# Patient Record
Sex: Male | Born: 1935 | Race: White | Hispanic: No | Marital: Married | Smoking: Former smoker
Health system: Southern US, Community
[De-identification: ages and names within clinical notes are randomized; demographics above are authoritative.]

## PROBLEM LIST (undated history)

## (undated) DIAGNOSIS — J189 Pneumonia, unspecified organism: Secondary | ICD-10-CM

## (undated) DIAGNOSIS — C499 Malignant neoplasm of connective and soft tissue, unspecified: Secondary | ICD-10-CM

## (undated) DIAGNOSIS — J90 Pleural effusion, not elsewhere classified: Secondary | ICD-10-CM

## (undated) DIAGNOSIS — I4819 Other persistent atrial fibrillation: Secondary | ICD-10-CM

## (undated) DIAGNOSIS — K769 Liver disease, unspecified: Secondary | ICD-10-CM

## (undated) DIAGNOSIS — N400 Enlarged prostate without lower urinary tract symptoms: Secondary | ICD-10-CM

## (undated) DIAGNOSIS — K219 Gastro-esophageal reflux disease without esophagitis: Secondary | ICD-10-CM

## (undated) DIAGNOSIS — I5020 Unspecified systolic (congestive) heart failure: Secondary | ICD-10-CM

## (undated) DIAGNOSIS — R569 Unspecified convulsions: Secondary | ICD-10-CM

## (undated) DIAGNOSIS — I472 Ventricular tachycardia, unspecified: Secondary | ICD-10-CM

## (undated) DIAGNOSIS — Z8719 Personal history of other diseases of the digestive system: Secondary | ICD-10-CM

## (undated) DIAGNOSIS — C248 Malignant neoplasm of overlapping sites of biliary tract: Secondary | ICD-10-CM

## (undated) DIAGNOSIS — K8689 Other specified diseases of pancreas: Secondary | ICD-10-CM

## (undated) DIAGNOSIS — H269 Unspecified cataract: Secondary | ICD-10-CM

## (undated) DIAGNOSIS — I2510 Atherosclerotic heart disease of native coronary artery without angina pectoris: Secondary | ICD-10-CM

## (undated) DIAGNOSIS — I5023 Acute on chronic systolic (congestive) heart failure: Secondary | ICD-10-CM

## (undated) DIAGNOSIS — I10 Essential (primary) hypertension: Secondary | ICD-10-CM

## (undated) DIAGNOSIS — E039 Hypothyroidism, unspecified: Secondary | ICD-10-CM

## (undated) DIAGNOSIS — N182 Chronic kidney disease, stage 2 (mild): Secondary | ICD-10-CM

## (undated) HISTORY — DX: Unspecified systolic (congestive) heart failure: I50.20

## (undated) HISTORY — DX: Essential (primary) hypertension: I10

## (undated) HISTORY — PX: CHOLECYSTECTOMY: SHX55

## (undated) HISTORY — DX: Malignant neoplasm of connective and soft tissue, unspecified: C49.9

## (undated) HISTORY — PX: APPENDECTOMY: SHX54

## (undated) HISTORY — PX: FRACTURE SURGERY: SHX138

## (undated) HISTORY — DX: Gastro-esophageal reflux disease without esophagitis: K21.9

## (undated) HISTORY — DX: Other specified diseases of pancreas: K86.89

## (undated) HISTORY — PX: PARTIAL GASTRECTOMY: SHX2172

## (undated) HISTORY — PX: THORACENTESIS: SHX235

## (undated) HISTORY — DX: Malignant neoplasm of overlapping sites of biliary tract: C24.8

## (undated) HISTORY — PX: WHIPPLE PROCEDURE: SHX2667

## (undated) HISTORY — DX: Unspecified cataract: H26.9

## (undated) HISTORY — DX: Atherosclerotic heart disease of native coronary artery without angina pectoris: I25.10

## (undated) HISTORY — DX: Benign prostatic hyperplasia without lower urinary tract symptoms: N40.0

## (undated) HISTORY — DX: Ventricular tachycardia: I47.2

## (undated) HISTORY — DX: Personal history of other diseases of the digestive system: Z87.19

## (undated) HISTORY — DX: Liver disease, unspecified: K76.9

## (undated) HISTORY — DX: Ventricular tachycardia, unspecified: I47.20

## (undated) HISTORY — PX: INGUINAL HERNIA REPAIR: SUR1180

## (undated) HISTORY — PX: TUMOR EXCISION: SHX421

## (undated) HISTORY — DX: Hypothyroidism, unspecified: E03.9

## (undated) HISTORY — PX: CATARACT EXTRACTION W/ INTRAOCULAR LENS  IMPLANT, BILATERAL: SHX1307

---
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
## 1989-11-28 DIAGNOSIS — C499 Malignant neoplasm of connective and soft tissue, unspecified: Secondary | ICD-10-CM

## 1989-11-28 HISTORY — DX: Malignant neoplasm of connective and soft tissue, unspecified: C49.9

## 1996-11-28 DIAGNOSIS — C248 Malignant neoplasm of overlapping sites of biliary tract: Secondary | ICD-10-CM

## 1996-11-28 HISTORY — DX: Malignant neoplasm of overlapping sites of biliary tract: C24.8

## 2006-09-21 ENCOUNTER — Ambulatory Visit: Payer: Self-pay | Admitting: Internal Medicine

## 2006-10-04 ENCOUNTER — Ambulatory Visit: Payer: Self-pay | Admitting: Gastroenterology

## 2006-10-10 ENCOUNTER — Encounter (INDEPENDENT_AMBULATORY_CARE_PROVIDER_SITE_OTHER): Payer: Self-pay | Admitting: Specialist

## 2006-10-10 ENCOUNTER — Ambulatory Visit: Payer: Self-pay | Admitting: Gastroenterology

## 2006-10-10 LAB — HM COLONOSCOPY: HM Colonoscopy: ABNORMAL

## 2006-12-05 ENCOUNTER — Ambulatory Visit: Payer: Self-pay | Admitting: Internal Medicine

## 2006-12-05 LAB — CONVERTED CEMR LAB
BUN: 19 mg/dL (ref 6–23)
CO2: 30 meq/L (ref 19–32)
Calcium: 9.2 mg/dL (ref 8.4–10.5)
Chloride: 105 meq/L (ref 96–112)
Creatinine, Ser: 0.9 mg/dL (ref 0.4–1.5)
Hgb A1c MFr Bld: 6.6 % — ABNORMAL HIGH (ref 4.6–6.0)
Triglyceride fasting, serum: 57 mg/dL (ref 0–149)
VLDL: 11 mg/dL (ref 0–40)

## 2007-04-23 ENCOUNTER — Emergency Department (HOSPITAL_COMMUNITY): Admission: EM | Admit: 2007-04-23 | Discharge: 2007-04-23 | Payer: Self-pay | Admitting: Emergency Medicine

## 2007-04-24 ENCOUNTER — Ambulatory Visit: Payer: Self-pay | Admitting: Internal Medicine

## 2007-04-27 ENCOUNTER — Ambulatory Visit: Payer: Self-pay | Admitting: Internal Medicine

## 2007-04-30 ENCOUNTER — Ambulatory Visit: Payer: Self-pay | Admitting: Cardiovascular Disease

## 2007-05-04 ENCOUNTER — Ambulatory Visit: Payer: Self-pay | Admitting: Cardiology

## 2007-05-09 ENCOUNTER — Ambulatory Visit: Payer: Self-pay | Admitting: Cardiology

## 2007-05-16 ENCOUNTER — Ambulatory Visit: Payer: Self-pay | Admitting: Cardiology

## 2007-05-22 ENCOUNTER — Ambulatory Visit: Payer: Self-pay | Admitting: Cardiology

## 2007-05-22 ENCOUNTER — Ambulatory Visit: Payer: Self-pay | Admitting: Internal Medicine

## 2007-05-22 ENCOUNTER — Encounter: Payer: Self-pay | Admitting: Internal Medicine

## 2007-05-22 ENCOUNTER — Ambulatory Visit: Payer: Self-pay

## 2007-05-22 LAB — CONVERTED CEMR LAB
Basophils Absolute: 0.1 10*3/uL (ref 0.0–0.1)
Basophils Relative: 0.8 % (ref 0.0–1.0)
CO2: 30 meq/L (ref 19–32)
Chloride: 102 meq/L (ref 96–112)
Creatinine, Ser: 1 mg/dL (ref 0.4–1.5)
HCT: 42.5 % (ref 39.0–52.0)
Hemoglobin: 14.2 g/dL (ref 13.0–17.0)
INR: 2.3 — ABNORMAL HIGH (ref 0.9–2.0)
Magnesium: 2.2 mg/dL (ref 1.5–2.5)
Monocytes Absolute: 0.8 10*3/uL — ABNORMAL HIGH (ref 0.2–0.7)
Neutrophils Relative %: 64.7 % (ref 43.0–77.0)
Potassium: 4.5 meq/L (ref 3.5–5.1)
Prothrombin Time: 18.9 s — ABNORMAL HIGH (ref 10.0–14.0)
RBC: 4.32 M/uL (ref 4.22–5.81)
RDW: 12.8 % (ref 11.5–14.6)
Sodium: 138 meq/L (ref 135–145)
WBC: 7 10*3/uL (ref 4.5–10.5)
aPTT: 34.3 s (ref 26.5–36.5)

## 2007-05-24 ENCOUNTER — Encounter: Payer: Self-pay | Admitting: Internal Medicine

## 2007-05-24 ENCOUNTER — Ambulatory Visit: Payer: Self-pay | Admitting: Internal Medicine

## 2007-05-24 ENCOUNTER — Ambulatory Visit (HOSPITAL_COMMUNITY): Admission: RE | Admit: 2007-05-24 | Discharge: 2007-05-24 | Payer: Self-pay | Admitting: Internal Medicine

## 2007-05-29 ENCOUNTER — Ambulatory Visit: Payer: Self-pay | Admitting: Internal Medicine

## 2007-05-30 ENCOUNTER — Ambulatory Visit: Payer: Self-pay | Admitting: Internal Medicine

## 2007-05-30 LAB — CONVERTED CEMR LAB
BUN: 17 mg/dL
CO2: 29 meq/L
Calcium: 9.1 mg/dL
Chloride: 103 meq/L
Creatinine, Ser: 0.8 mg/dL
GFR calc Af Amer: 120 mL/min
GFR calc non Af Amer: 99 mL/min
Glucose, Bld: 134 mg/dL — ABNORMAL HIGH
INR: 2.3 — ABNORMAL HIGH
Magnesium: 2.3 mg/dL
Potassium: 4.5 meq/L
Prothrombin Time: 18.8 s — ABNORMAL HIGH
Sodium: 139 meq/L

## 2007-05-31 ENCOUNTER — Inpatient Hospital Stay (HOSPITAL_COMMUNITY): Admission: AD | Admit: 2007-05-31 | Discharge: 2007-06-03 | Payer: Self-pay | Admitting: Internal Medicine

## 2007-05-31 ENCOUNTER — Ambulatory Visit: Payer: Self-pay | Admitting: Internal Medicine

## 2007-06-14 ENCOUNTER — Ambulatory Visit: Payer: Self-pay | Admitting: Cardiology

## 2007-06-14 LAB — CONVERTED CEMR LAB
BUN: 19 mg/dL (ref 6–23)
Calcium: 9.2 mg/dL (ref 8.4–10.5)
GFR calc Af Amer: 104 mL/min
Magnesium: 2.3 mg/dL (ref 1.5–2.5)
Potassium: 5 meq/L (ref 3.5–5.1)

## 2007-07-16 ENCOUNTER — Encounter: Payer: Self-pay | Admitting: Internal Medicine

## 2007-07-16 ENCOUNTER — Ambulatory Visit: Payer: Self-pay

## 2007-07-16 ENCOUNTER — Ambulatory Visit: Payer: Self-pay | Admitting: Cardiology

## 2007-07-18 ENCOUNTER — Ambulatory Visit: Payer: Self-pay | Admitting: Internal Medicine

## 2007-07-23 ENCOUNTER — Ambulatory Visit: Payer: Self-pay

## 2007-07-23 LAB — CONVERTED CEMR LAB
Eosinophils Absolute: 0.2 10*3/uL (ref 0.0–0.6)
Eosinophils Relative: 3 % (ref 0.0–5.0)
HCT: 35.2 % — ABNORMAL LOW (ref 39.0–52.0)
Hemoglobin: 12.5 g/dL — ABNORMAL LOW (ref 13.0–17.0)
INR: 1.7 — ABNORMAL HIGH (ref 0.8–1.0)
Lymphocytes Relative: 15 % (ref 12.0–46.0)
MCV: 94 fL (ref 78.0–100.0)
Neutro Abs: 3.9 10*3/uL (ref 1.4–7.7)
Neutrophils Relative %: 69 % (ref 43.0–77.0)
Prothrombin Time: 16.1 s — ABNORMAL HIGH (ref 10.9–13.3)
WBC: 5.7 10*3/uL (ref 4.5–10.5)

## 2007-07-24 ENCOUNTER — Ambulatory Visit: Payer: Self-pay | Admitting: Internal Medicine

## 2007-08-13 ENCOUNTER — Ambulatory Visit: Payer: Self-pay | Admitting: Internal Medicine

## 2007-08-29 ENCOUNTER — Ambulatory Visit: Payer: Self-pay | Admitting: Internal Medicine

## 2007-09-10 ENCOUNTER — Ambulatory Visit: Payer: Self-pay | Admitting: Internal Medicine

## 2007-09-19 ENCOUNTER — Encounter: Payer: Self-pay | Admitting: *Deleted

## 2007-09-19 DIAGNOSIS — E118 Type 2 diabetes mellitus with unspecified complications: Secondary | ICD-10-CM | POA: Insufficient documentation

## 2007-09-19 DIAGNOSIS — Z87448 Personal history of other diseases of urinary system: Secondary | ICD-10-CM

## 2007-09-19 DIAGNOSIS — C248 Malignant neoplasm of overlapping sites of biliary tract: Secondary | ICD-10-CM | POA: Insufficient documentation

## 2007-09-19 DIAGNOSIS — Z9889 Other specified postprocedural states: Secondary | ICD-10-CM

## 2007-09-19 DIAGNOSIS — I4819 Other persistent atrial fibrillation: Secondary | ICD-10-CM

## 2007-09-19 DIAGNOSIS — Z87898 Personal history of other specified conditions: Secondary | ICD-10-CM

## 2007-09-19 DIAGNOSIS — K219 Gastro-esophageal reflux disease without esophagitis: Secondary | ICD-10-CM

## 2007-09-19 DIAGNOSIS — K922 Gastrointestinal hemorrhage, unspecified: Secondary | ICD-10-CM | POA: Insufficient documentation

## 2007-09-19 DIAGNOSIS — Z9089 Acquired absence of other organs: Secondary | ICD-10-CM

## 2007-10-08 ENCOUNTER — Ambulatory Visit: Payer: Self-pay | Admitting: Internal Medicine

## 2007-10-24 ENCOUNTER — Ambulatory Visit: Payer: Self-pay | Admitting: Internal Medicine

## 2007-10-26 ENCOUNTER — Encounter: Payer: Self-pay | Admitting: Internal Medicine

## 2007-10-31 ENCOUNTER — Ambulatory Visit: Payer: Self-pay | Admitting: Cardiology

## 2007-10-31 ENCOUNTER — Ambulatory Visit: Payer: Self-pay | Admitting: Internal Medicine

## 2007-11-16 ENCOUNTER — Ambulatory Visit: Payer: Self-pay | Admitting: Cardiology

## 2007-12-14 ENCOUNTER — Ambulatory Visit: Payer: Self-pay | Admitting: Cardiology

## 2008-01-11 ENCOUNTER — Telehealth: Payer: Self-pay | Admitting: Internal Medicine

## 2008-01-11 ENCOUNTER — Ambulatory Visit: Payer: Self-pay | Admitting: Internal Medicine

## 2008-01-20 ENCOUNTER — Telehealth: Payer: Self-pay | Admitting: Internal Medicine

## 2008-01-20 ENCOUNTER — Inpatient Hospital Stay (HOSPITAL_COMMUNITY): Admission: EM | Admit: 2008-01-20 | Discharge: 2008-01-30 | Payer: Self-pay | Admitting: Emergency Medicine

## 2008-01-21 ENCOUNTER — Ambulatory Visit: Payer: Self-pay | Admitting: Internal Medicine

## 2008-01-24 ENCOUNTER — Ambulatory Visit: Payer: Self-pay | Admitting: Gastroenterology

## 2008-01-25 ENCOUNTER — Encounter: Payer: Self-pay | Admitting: Internal Medicine

## 2008-01-26 ENCOUNTER — Encounter: Payer: Self-pay | Admitting: Internal Medicine

## 2008-01-27 DIAGNOSIS — K769 Liver disease, unspecified: Secondary | ICD-10-CM

## 2008-01-27 HISTORY — DX: Liver disease, unspecified: K76.9

## 2008-01-29 ENCOUNTER — Encounter (INDEPENDENT_AMBULATORY_CARE_PROVIDER_SITE_OTHER): Payer: Self-pay | Admitting: Interventional Radiology

## 2008-02-01 ENCOUNTER — Emergency Department (HOSPITAL_COMMUNITY): Admission: EM | Admit: 2008-02-01 | Discharge: 2008-02-01 | Payer: Self-pay | Admitting: Emergency Medicine

## 2008-02-04 ENCOUNTER — Telehealth: Payer: Self-pay | Admitting: Internal Medicine

## 2008-02-04 ENCOUNTER — Ambulatory Visit: Payer: Self-pay | Admitting: Internal Medicine

## 2008-02-04 DIAGNOSIS — K831 Obstruction of bile duct: Secondary | ICD-10-CM | POA: Insufficient documentation

## 2008-02-04 LAB — CONVERTED CEMR LAB
Basophils Absolute: 0.1 10*3/uL (ref 0.0–0.1)
Basophils Relative: 0.6 % (ref 0.0–1.0)
Hemoglobin: 12.5 g/dL — ABNORMAL LOW (ref 13.0–17.0)
Lymphocytes Relative: 8.3 % — ABNORMAL LOW (ref 12.0–46.0)
MCHC: 32.9 g/dL (ref 30.0–36.0)
Monocytes Absolute: 1 10*3/uL — ABNORMAL HIGH (ref 0.2–0.7)
Monocytes Relative: 11.8 % — ABNORMAL HIGH (ref 3.0–11.0)
Neutro Abs: 6.7 10*3/uL (ref 1.4–7.7)
Neutrophils Relative %: 78.9 % — ABNORMAL HIGH (ref 43.0–77.0)
aPTT: 29.5 s (ref 21.7–29.8)

## 2008-02-05 ENCOUNTER — Encounter: Payer: Self-pay | Admitting: Internal Medicine

## 2008-02-06 ENCOUNTER — Encounter: Payer: Self-pay | Admitting: Internal Medicine

## 2008-02-07 ENCOUNTER — Ambulatory Visit (HOSPITAL_COMMUNITY): Admission: RE | Admit: 2008-02-07 | Discharge: 2008-02-07 | Payer: Self-pay | Admitting: Internal Medicine

## 2008-02-08 ENCOUNTER — Ambulatory Visit: Payer: Self-pay | Admitting: Internal Medicine

## 2008-02-12 ENCOUNTER — Encounter (INDEPENDENT_AMBULATORY_CARE_PROVIDER_SITE_OTHER): Payer: Self-pay | Admitting: *Deleted

## 2008-02-15 ENCOUNTER — Ambulatory Visit: Payer: Self-pay | Admitting: Cardiovascular Disease

## 2008-02-18 ENCOUNTER — Ambulatory Visit: Payer: Self-pay | Admitting: Internal Medicine

## 2008-02-20 ENCOUNTER — Telehealth: Payer: Self-pay | Admitting: Internal Medicine

## 2008-02-21 ENCOUNTER — Encounter: Admission: RE | Admit: 2008-02-21 | Discharge: 2008-02-21 | Payer: Self-pay | Admitting: Interventional Radiology

## 2008-02-25 ENCOUNTER — Encounter: Payer: Self-pay | Admitting: Internal Medicine

## 2008-02-26 ENCOUNTER — Ambulatory Visit: Payer: Self-pay | Admitting: Internal Medicine

## 2008-03-03 ENCOUNTER — Ambulatory Visit: Payer: Self-pay | Admitting: Gastroenterology

## 2008-03-11 ENCOUNTER — Ambulatory Visit: Payer: Self-pay | Admitting: Cardiology

## 2008-03-21 ENCOUNTER — Encounter: Payer: Self-pay | Admitting: Internal Medicine

## 2008-04-01 ENCOUNTER — Ambulatory Visit: Payer: Self-pay | Admitting: Cardiology

## 2008-04-11 ENCOUNTER — Ambulatory Visit: Payer: Self-pay | Admitting: Cardiology

## 2008-04-17 ENCOUNTER — Ambulatory Visit: Payer: Self-pay | Admitting: Internal Medicine

## 2008-04-17 DIAGNOSIS — K4090 Unilateral inguinal hernia, without obstruction or gangrene, not specified as recurrent: Secondary | ICD-10-CM | POA: Insufficient documentation

## 2008-04-22 ENCOUNTER — Telehealth: Payer: Self-pay | Admitting: Internal Medicine

## 2008-04-25 ENCOUNTER — Ambulatory Visit: Payer: Self-pay | Admitting: Cardiology

## 2008-05-23 ENCOUNTER — Ambulatory Visit: Payer: Self-pay | Admitting: Cardiology

## 2008-06-09 ENCOUNTER — Ambulatory Visit: Payer: Self-pay | Admitting: Cardiology

## 2008-06-13 ENCOUNTER — Telehealth: Payer: Self-pay | Admitting: Internal Medicine

## 2008-06-30 ENCOUNTER — Ambulatory Visit: Payer: Self-pay | Admitting: Cardiology

## 2008-07-18 ENCOUNTER — Ambulatory Visit: Payer: Self-pay | Admitting: Internal Medicine

## 2008-07-21 ENCOUNTER — Encounter (INDEPENDENT_AMBULATORY_CARE_PROVIDER_SITE_OTHER): Payer: Self-pay | Admitting: *Deleted

## 2008-07-29 ENCOUNTER — Ambulatory Visit: Payer: Self-pay | Admitting: Cardiology

## 2008-08-06 ENCOUNTER — Ambulatory Visit: Payer: Self-pay | Admitting: Internal Medicine

## 2008-08-06 LAB — CONVERTED CEMR LAB
Basophils Absolute: 0 10*3/uL (ref 0.0–0.1)
Calcium: 9 mg/dL (ref 8.4–10.5)
GFR calc Af Amer: 68 mL/min
GFR calc non Af Amer: 56 mL/min
Glucose, Bld: 156 mg/dL — ABNORMAL HIGH (ref 70–99)
HCT: 36.5 % — ABNORMAL LOW (ref 39.0–52.0)
MCHC: 34.9 g/dL (ref 30.0–36.0)
Magnesium: 2.2 mg/dL (ref 1.5–2.5)
Monocytes Absolute: 0.6 10*3/uL (ref 0.1–1.0)
Monocytes Relative: 9.8 % (ref 3.0–12.0)
Platelets: 165 10*3/uL (ref 150–400)
Potassium: 5.5 meq/L — ABNORMAL HIGH (ref 3.5–5.1)
RDW: 13.1 % (ref 11.5–14.6)
Sodium: 140 meq/L (ref 135–145)

## 2008-08-14 ENCOUNTER — Ambulatory Visit: Payer: Self-pay | Admitting: Internal Medicine

## 2008-08-14 LAB — CONVERTED CEMR LAB
Calcium: 9.4 mg/dL (ref 8.4–10.5)
GFR calc Af Amer: 83 mL/min
GFR calc non Af Amer: 68 mL/min
Glucose, Bld: 68 mg/dL — ABNORMAL LOW (ref 70–99)
Potassium: 5.2 meq/L — ABNORMAL HIGH (ref 3.5–5.1)
Sodium: 138 meq/L (ref 135–145)

## 2008-08-18 ENCOUNTER — Encounter: Payer: Self-pay | Admitting: Internal Medicine

## 2008-08-26 ENCOUNTER — Ambulatory Visit (HOSPITAL_COMMUNITY): Admission: RE | Admit: 2008-08-26 | Discharge: 2008-08-27 | Payer: Self-pay | Admitting: General Surgery

## 2008-08-28 ENCOUNTER — Telehealth: Payer: Self-pay | Admitting: Internal Medicine

## 2008-09-04 ENCOUNTER — Ambulatory Visit: Payer: Self-pay | Admitting: Cardiology

## 2008-09-12 ENCOUNTER — Ambulatory Visit: Payer: Self-pay | Admitting: Cardiology

## 2008-09-15 ENCOUNTER — Ambulatory Visit: Payer: Self-pay | Admitting: Internal Medicine

## 2008-09-15 ENCOUNTER — Encounter: Payer: Self-pay | Admitting: Internal Medicine

## 2008-09-15 LAB — CONVERTED CEMR LAB
BUN: 21 mg/dL (ref 6–23)
Chloride: 104 meq/L (ref 96–112)
Creatinine, Ser: 1 mg/dL (ref 0.4–1.5)
GFR calc Af Amer: 92 mL/min
GFR calc non Af Amer: 76 mL/min
Potassium: 5 meq/L (ref 3.5–5.1)

## 2008-09-24 ENCOUNTER — Ambulatory Visit: Payer: Self-pay | Admitting: Cardiology

## 2008-10-17 ENCOUNTER — Ambulatory Visit: Payer: Self-pay | Admitting: Cardiovascular Disease

## 2008-10-21 ENCOUNTER — Ambulatory Visit: Payer: Self-pay | Admitting: Internal Medicine

## 2008-10-21 LAB — CONVERTED CEMR LAB
CO2: 30 meq/L (ref 19–32)
Calcium: 9.2 mg/dL (ref 8.4–10.5)
Creatinine, Ser: 0.9 mg/dL (ref 0.4–1.5)
GFR calc non Af Amer: 86 mL/min
Hgb A1c MFr Bld: 7 % — ABNORMAL HIGH (ref 4.6–6.0)
Sodium: 137 meq/L (ref 135–145)

## 2008-10-27 ENCOUNTER — Encounter: Payer: Self-pay | Admitting: Internal Medicine

## 2008-10-27 ENCOUNTER — Telehealth: Payer: Self-pay | Admitting: Internal Medicine

## 2008-11-03 ENCOUNTER — Encounter: Payer: Self-pay | Admitting: Internal Medicine

## 2008-11-14 ENCOUNTER — Ambulatory Visit: Payer: Self-pay | Admitting: Cardiovascular Disease

## 2008-12-12 ENCOUNTER — Ambulatory Visit: Payer: Self-pay | Admitting: Cardiology

## 2008-12-26 ENCOUNTER — Ambulatory Visit: Payer: Self-pay | Admitting: Cardiovascular Disease

## 2009-01-20 ENCOUNTER — Ambulatory Visit: Payer: Self-pay | Admitting: Internal Medicine

## 2009-01-20 DIAGNOSIS — R141 Gas pain: Secondary | ICD-10-CM

## 2009-01-20 DIAGNOSIS — R143 Flatulence: Secondary | ICD-10-CM

## 2009-01-20 DIAGNOSIS — R142 Eructation: Secondary | ICD-10-CM

## 2009-01-23 ENCOUNTER — Ambulatory Visit: Payer: Self-pay | Admitting: Internal Medicine

## 2009-02-04 ENCOUNTER — Ambulatory Visit: Payer: Self-pay | Admitting: Internal Medicine

## 2009-02-04 LAB — CONVERTED CEMR LAB
Calcium: 9.3 mg/dL (ref 8.4–10.5)
Creatinine, Ser: 1 mg/dL (ref 0.4–1.5)
GFR calc Af Amer: 92 mL/min
GFR calc non Af Amer: 76 mL/min
Magnesium: 2.4 mg/dL (ref 1.5–2.5)
Sodium: 138 meq/L (ref 135–145)

## 2009-02-06 ENCOUNTER — Ambulatory Visit: Payer: Self-pay | Admitting: Cardiovascular Disease

## 2009-03-06 ENCOUNTER — Ambulatory Visit: Payer: Self-pay | Admitting: Cardiology

## 2009-03-20 ENCOUNTER — Ambulatory Visit: Payer: Self-pay | Admitting: Cardiology

## 2009-04-10 ENCOUNTER — Ambulatory Visit: Payer: Self-pay | Admitting: Internal Medicine

## 2009-04-10 ENCOUNTER — Ambulatory Visit: Payer: Self-pay | Admitting: Cardiology

## 2009-04-13 LAB — CONVERTED CEMR LAB
Calcium: 8.7 mg/dL (ref 8.4–10.5)
Creatinine, Ser: 1 mg/dL (ref 0.4–1.5)
GFR calc non Af Amer: 76.06 mL/min (ref >60)
Sodium: 139 meq/L (ref 135–145)

## 2009-04-15 ENCOUNTER — Telehealth: Payer: Self-pay | Admitting: Internal Medicine

## 2009-04-23 ENCOUNTER — Encounter: Payer: Self-pay | Admitting: Internal Medicine

## 2009-04-27 ENCOUNTER — Encounter: Payer: Self-pay | Admitting: Internal Medicine

## 2009-04-28 ENCOUNTER — Encounter: Payer: Self-pay | Admitting: *Deleted

## 2009-05-01 ENCOUNTER — Ambulatory Visit: Payer: Self-pay | Admitting: Cardiology

## 2009-05-01 LAB — CONVERTED CEMR LAB
POC INR: 2
Protime: 17.5

## 2009-05-29 ENCOUNTER — Ambulatory Visit: Payer: Self-pay | Admitting: Cardiovascular Disease

## 2009-05-29 ENCOUNTER — Encounter (INDEPENDENT_AMBULATORY_CARE_PROVIDER_SITE_OTHER): Payer: Self-pay | Admitting: Cardiology

## 2009-05-29 LAB — CONVERTED CEMR LAB: Prothrombin Time: 17.8 s

## 2009-06-03 ENCOUNTER — Encounter: Payer: Self-pay | Admitting: *Deleted

## 2009-06-26 ENCOUNTER — Ambulatory Visit: Payer: Self-pay | Admitting: Cardiology

## 2009-06-26 LAB — CONVERTED CEMR LAB: POC INR: 1.8

## 2009-07-17 ENCOUNTER — Ambulatory Visit: Payer: Self-pay | Admitting: Cardiology

## 2009-08-04 ENCOUNTER — Ambulatory Visit: Payer: Self-pay | Admitting: Internal Medicine

## 2009-08-05 LAB — CONVERTED CEMR LAB
BUN: 18 mg/dL (ref 6–23)
CO2: 29 meq/L (ref 19–32)
Chloride: 105 meq/L (ref 96–112)
Magnesium: 2.2 mg/dL (ref 1.5–2.5)
Potassium: 5.1 meq/L (ref 3.5–5.1)

## 2009-08-14 ENCOUNTER — Ambulatory Visit: Payer: Self-pay | Admitting: Internal Medicine

## 2009-08-18 ENCOUNTER — Telehealth: Payer: Self-pay | Admitting: Internal Medicine

## 2009-08-24 ENCOUNTER — Telehealth: Payer: Self-pay | Admitting: Internal Medicine

## 2009-09-11 ENCOUNTER — Ambulatory Visit: Payer: Self-pay | Admitting: Internal Medicine

## 2009-09-18 ENCOUNTER — Telehealth: Payer: Self-pay | Admitting: Internal Medicine

## 2009-09-21 ENCOUNTER — Telehealth: Payer: Self-pay | Admitting: Internal Medicine

## 2009-10-09 ENCOUNTER — Ambulatory Visit: Payer: Self-pay | Admitting: Cardiology

## 2009-10-09 LAB — CONVERTED CEMR LAB: POC INR: 2.3

## 2009-11-06 ENCOUNTER — Ambulatory Visit: Payer: Self-pay | Admitting: Cardiology

## 2009-11-06 LAB — CONVERTED CEMR LAB: POC INR: 2.1

## 2009-12-04 ENCOUNTER — Ambulatory Visit: Payer: Self-pay | Admitting: Cardiology

## 2009-12-18 ENCOUNTER — Ambulatory Visit: Payer: Self-pay | Admitting: Cardiology

## 2009-12-18 LAB — CONVERTED CEMR LAB: POC INR: 2

## 2009-12-28 ENCOUNTER — Encounter: Payer: Self-pay | Admitting: Internal Medicine

## 2010-01-04 ENCOUNTER — Ambulatory Visit: Payer: Self-pay | Admitting: Internal Medicine

## 2010-01-04 DIAGNOSIS — R519 Headache, unspecified: Secondary | ICD-10-CM | POA: Insufficient documentation

## 2010-01-04 DIAGNOSIS — R51 Headache: Secondary | ICD-10-CM | POA: Insufficient documentation

## 2010-01-04 LAB — CONVERTED CEMR LAB
ALT: 23 units/L (ref 0–53)
AST: 29 units/L (ref 0–37)
Albumin: 4.1 g/dL (ref 3.5–5.2)
Cholesterol: 178 mg/dL (ref 0–200)
Eosinophils Relative: 4.6 % (ref 0.0–5.0)
GFR calc non Af Amer: 75.92 mL/min (ref >60)
Glucose, Bld: 136 mg/dL — ABNORMAL HIGH (ref 70–99)
HDL: 46.4 mg/dL (ref >39.00)
Hgb A1c MFr Bld: 7 % — ABNORMAL HIGH (ref 4.6–6.5)
MCV: 101.6 fL — ABNORMAL HIGH (ref 78.0–100.0)
Monocytes Absolute: 0.8 10*3/uL (ref 0.1–1.0)
Monocytes Relative: 10.8 % (ref 3.0–12.0)
Neutrophils Relative %: 70.2 % (ref 43.0–77.0)
PSA: 2.28 ng/mL (ref 0.10–4.00)
Platelets: 171 10*3/uL (ref 150.0–400.0)
Potassium: 4.7 meq/L (ref 3.5–5.1)
Sodium: 139 meq/L (ref 135–145)
Total CHOL/HDL Ratio: 4
Total Protein: 7.7 g/dL (ref 6.0–8.3)
Triglycerides: 61 mg/dL (ref 0.0–149.0)
WBC: 7 10*3/uL (ref 4.5–10.5)

## 2010-01-05 DIAGNOSIS — J309 Allergic rhinitis, unspecified: Secondary | ICD-10-CM | POA: Insufficient documentation

## 2010-01-05 DIAGNOSIS — M545 Low back pain: Secondary | ICD-10-CM | POA: Insufficient documentation

## 2010-01-08 ENCOUNTER — Encounter (INDEPENDENT_AMBULATORY_CARE_PROVIDER_SITE_OTHER): Payer: Self-pay | Admitting: Cardiology

## 2010-01-08 ENCOUNTER — Ambulatory Visit: Payer: Self-pay | Admitting: Internal Medicine

## 2010-01-27 ENCOUNTER — Encounter: Payer: Self-pay | Admitting: Internal Medicine

## 2010-02-03 ENCOUNTER — Telehealth: Payer: Self-pay | Admitting: Internal Medicine

## 2010-02-05 ENCOUNTER — Ambulatory Visit: Payer: Self-pay | Admitting: Cardiology

## 2010-02-15 ENCOUNTER — Encounter: Payer: Self-pay | Admitting: Internal Medicine

## 2010-03-04 ENCOUNTER — Ambulatory Visit: Payer: Self-pay | Admitting: Cardiovascular Disease

## 2010-03-15 ENCOUNTER — Encounter: Payer: Self-pay | Admitting: Internal Medicine

## 2010-03-22 ENCOUNTER — Ambulatory Visit: Payer: Self-pay | Admitting: Cardiovascular Disease

## 2010-04-19 ENCOUNTER — Ambulatory Visit: Payer: Self-pay | Admitting: Cardiology

## 2010-05-17 ENCOUNTER — Ambulatory Visit: Payer: Self-pay

## 2010-06-02 ENCOUNTER — Telehealth: Payer: Self-pay | Admitting: Internal Medicine

## 2010-06-02 ENCOUNTER — Ambulatory Visit: Payer: Self-pay | Admitting: Internal Medicine

## 2010-06-02 LAB — CONVERTED CEMR LAB: POC INR: 2.2

## 2010-06-23 ENCOUNTER — Ambulatory Visit: Payer: Self-pay | Admitting: Cardiology

## 2010-06-23 LAB — CONVERTED CEMR LAB: POC INR: 3

## 2010-07-05 ENCOUNTER — Encounter: Payer: Self-pay | Admitting: Internal Medicine

## 2010-07-21 ENCOUNTER — Ambulatory Visit: Payer: Self-pay | Admitting: Internal Medicine

## 2010-07-21 LAB — CONVERTED CEMR LAB: POC INR: 2.6

## 2010-08-18 ENCOUNTER — Ambulatory Visit: Payer: Self-pay | Admitting: Cardiology

## 2010-08-18 LAB — CONVERTED CEMR LAB: POC INR: 2.5

## 2010-08-21 ENCOUNTER — Encounter: Admission: RE | Admit: 2010-08-21 | Discharge: 2010-08-21 | Payer: Self-pay | Admitting: Neurology

## 2010-08-26 ENCOUNTER — Ambulatory Visit: Payer: Self-pay | Admitting: Internal Medicine

## 2010-08-30 LAB — CONVERTED CEMR LAB
Calcium: 8.9 mg/dL (ref 8.4–10.5)
Creatinine, Ser: 1 mg/dL (ref 0.4–1.5)
GFR calc non Af Amer: 73.26 mL/min (ref >60)
Sodium: 139 meq/L (ref 135–145)

## 2010-09-15 ENCOUNTER — Ambulatory Visit: Payer: Self-pay | Admitting: Internal Medicine

## 2010-09-15 LAB — CONVERTED CEMR LAB: POC INR: 2.7

## 2010-10-13 ENCOUNTER — Ambulatory Visit: Payer: Self-pay | Admitting: Cardiology

## 2010-10-13 LAB — CONVERTED CEMR LAB: POC INR: 2.2

## 2010-11-10 ENCOUNTER — Ambulatory Visit: Payer: Self-pay | Admitting: Cardiology

## 2010-12-01 ENCOUNTER — Ambulatory Visit: Admission: RE | Admit: 2010-12-01 | Discharge: 2010-12-01 | Payer: Self-pay | Source: Home / Self Care

## 2010-12-01 LAB — CONVERTED CEMR LAB: POC INR: 1.7

## 2010-12-15 ENCOUNTER — Ambulatory Visit: Admission: RE | Admit: 2010-12-15 | Discharge: 2010-12-15 | Payer: Self-pay | Source: Home / Self Care

## 2010-12-15 LAB — CONVERTED CEMR LAB: POC INR: 2.6

## 2010-12-28 NOTE — Medication Information (Signed)
Summary: rov.m  Anticoagulant Therapy  Managed by: Bethena Midget, RN, BSN Referring MD: Sherryl Manges MD PCP: Link Snuffer MD: Antoine Poche MD, Fayrene Fearing Indication 1: Atrial Fibrillation (ICD-427.31) Lab Used: [facility] Site: [facility] INR POC 3.0 INR RANGE 2 - 3  Dietary changes: no    Health status changes: yes       Details: having frequent HA  Bleeding/hemorrhagic complications: no    Recent/future hospitalizations: no    Any changes in medication regimen? yes       Details: started nortriptyline for HA  Recent/future dental: no  Any missed doses?: no       Is patient compliant with meds? yes       Allergies: 1)  ! Penicillin 2)  ! * Clindamycin 3)  ! Percocet  Anticoagulation Management History:      The patient is taking warfarin and comes in today for a routine follow up visit.  Positive risk factors for bleeding include an age of 75 years or older, history of GI bleeding, and presence of serious comorbidities.  The bleeding index is 'high risk'.  Positive CHADS2 values include Age > 52 years old and History of Diabetes.  The start date was 04/23/2007.  His last INR was 1.7 RATIO.  Anticoagulation responsible provider: Antoine Poche MD, Fayrene Fearing.  INR POC: 3.0.  Cuvette Lot#: [id number].  Exp: 03/2011.    Anticoagulation Management Assessment/Plan:      The patient's current anticoagulation dose is Coumadin 5 mg  tabs: Take as directed by coumadin clinic..  The target INR is 2 - 3.  The next INR is due 03/05/2010.  Anticoagulation instructions were given to patient.  Results were reviewed/authorized by Bethena Midget, RN, BSN.  He was notified by Bethena Midget, RN, BSN.         Prior Anticoagulation Instructions: INR 2.3  Continue 1.5 tabs daily.  Recheck in 4 weeks.    Current Anticoagulation Instructions: INR 3.0 Today take 5mg s then resume 7.5mg s daily. Recheck in 4 weeks.

## 2010-12-28 NOTE — Letter (Signed)
Summary: Handout Printed  Printed Handout:  - Coumadin Instructions-w/out Meds 

## 2010-12-28 NOTE — Progress Notes (Signed)
Summary: new med/elevated hr   Phone Note Call from Patient Call back at Home Phone [phone]   Caller: Patient Reason for Call: Talk to Nurse Summary of Call: Dr Vela Prose put pt on nortriptylin 10mg , is this med ok for him to take? pt states his hr is elevated in the 70s Initial call taken by: Migdalia Dk,  February 03, 2010 10:06 AM  Follow-up for Phone Call        Pt concerned because his HR is 70 instead of his normal 60's. He is asymptomatic and was advised that this is a normal HR. Pt feels better now.  Follow-up by: Duncan Dull, RN, BSN,  February 03, 2010 10:18 AM

## 2010-12-28 NOTE — Letter (Signed)
Summary: [facility]  [facility]   Imported By: Lester [place]trails 07/09/2010 10:34:21  _____________________________________________________________________  External Attachment:    Type:   Image     Comment:   External Document

## 2010-12-28 NOTE — Medication Information (Signed)
Summary: [name]   Anticoagulant Therapy  Managed by: Reina Fuse, PharmD Referring MD: Sherryl Manges MD PCP: Link Snuffer MD: Johney Frame MD, Fayrene Fearing Indication 1: Atrial Fibrillation (ICD-427.31) Lab Used: [facility] Site: [facility] INR POC 2.6 INR RANGE 2 - 3  Dietary changes: no    Health status changes: no    Bleeding/hemorrhagic complications: no    Recent/future hospitalizations: no    Any changes in medication regimen? no    Recent/future dental: no  Any missed doses?: no       Is patient compliant with meds? yes       Allergies: 1)  ! Penicillin 2)  ! * Clindamycin 3)  ! Percocet  Anticoagulation Management History:      The patient is taking warfarin and comes in today for a routine follow up visit.  Positive risk factors for bleeding include an age of 75 years or older, history of GI bleeding, and presence of serious comorbidities.  The bleeding index is 'high risk'.  Positive CHADS2 values include Age > 24 years old and History of Diabetes.  The start date was 04/23/2007.  His last INR was 1.7 RATIO.  Anticoagulation responsible provider: Allred MD, Fayrene Fearing.  INR POC: 2.6.  Cuvette Lot#: [id number].  Exp: 08/2011.    Anticoagulation Management Assessment/Plan:      The patient's current anticoagulation dose is Coumadin 5 mg  tabs: Take as directed by coumadin clinic..  The target INR is 2 - 3.  The next INR is due 08/18/2010.  Anticoagulation instructions were given to patient.  Results were reviewed/authorized by Reina Fuse, PharmD.  He was notified by Reina Fuse PharmD.         Prior Anticoagulation Instructions: INR 3.0 Today take 5mg s then resume 7.5mg s everyday except 5mg s on Sundays. Recheck in 4 weeks.   Current Anticoagulation Instructions: INR 2.6  Continue taking Coumadin 1.5 tabs (7.5 mg) on all days except Coumadin 1 tab (5 mg) on Sundays.  Return to clinic in 4 weeks.

## 2010-12-28 NOTE — Medication Information (Signed)
Summary: rov/tm  Anticoagulant Therapy  Managed by: Shelby Dubin, PharmD, BCPS, CPP Referring MD: Sherryl Manges MD PCP: Link Snuffer MD: Tenny Craw MD, Gunnar Fusi Indication 1: Atrial Fibrillation (ICD-427.31) Lab Used: [facility] Site: [facility] INR POC 2.3 INR RANGE 2 - 3  Dietary changes: no    Health status changes: no    Bleeding/hemorrhagic complications: no    Recent/future hospitalizations: no    Any changes in medication regimen? no    Recent/future dental: no  Any missed doses?: no       Is patient compliant with meds? yes       Allergies (verified): 1)  ! Penicillin 2)  ! * Clindamycin 3)  ! Percocet  Anticoagulation Management History:      The patient is taking warfarin and comes in today for a routine follow up visit.  Positive risk factors for bleeding include an age of 16 years or older, history of GI bleeding, and presence of serious comorbidities.  The bleeding index is 'high risk'.  Positive CHADS2 values include Age > 53 years old and History of Diabetes.  The start date was 04/23/2007.  His last INR was 1.7 RATIO.  Anticoagulation responsible provider: Tenny Craw MD, Gunnar Fusi.  INR POC: 2.3.  Cuvette Lot#: [id number].  Exp: 02/2011.    Anticoagulation Management Assessment/Plan:      The patient's current anticoagulation dose is Coumadin 5 mg  tabs: Take as directed by coumadin clinic..  The target INR is 2 - 3.  The next INR is due 02/05/2010.  Anticoagulation instructions were given to patient.  Results were reviewed/authorized by Shelby Dubin, PharmD, BCPS, CPP.  He was notified by Shelby Dubin PharmD, BCPS, CPP.         Prior Anticoagulation Instructions: INR 2.0 Continue 7.5mg s daily. Recheck in 3 weeks.   Current Anticoagulation Instructions: INR 2.3  Continue 1.5 tabs daily.  Recheck in 4 weeks.

## 2010-12-28 NOTE — Medication Information (Signed)
Summary: rov/tm  Anticoagulant Therapy  Managed by: Bethena Midget, RN, BSN Referring MD: Sherryl Manges MD PCP: Link Snuffer MD: Eden Emms MD, Theron Arista Indication 1: Atrial Fibrillation (ICD-427.31) Lab Used: [facility] Site: [facility] INR POC 3.8 INR RANGE 2 - 3  Dietary changes: no    Health status changes: no    Bleeding/hemorrhagic complications: no    Recent/future hospitalizations: no    Any changes in medication regimen? no    Recent/future dental: no  Any missed doses?: no       Is patient compliant with meds? yes       Allergies: 1)  ! Penicillin 2)  ! * Clindamycin 3)  ! Percocet  Anticoagulation Management History:      The patient is taking warfarin and comes in today for a routine follow up visit.  Positive risk factors for bleeding include an age of 75 years or older, history of GI bleeding, and presence of serious comorbidities.  The bleeding index is 'high risk'.  Positive CHADS2 values include Age > 47 years old and History of Diabetes.  The start date was 04/23/2007.  His last INR was 1.7 RATIO.  Anticoagulation responsible provider: Eden Emms MD, Theron Arista.  INR POC: 3.8.  Cuvette Lot#: [id number].  Exp: 03/2011.    Anticoagulation Management Assessment/Plan:      The patient's current anticoagulation dose is Coumadin 5 mg  tabs: Take as directed by coumadin clinic..  The target INR is 2 - 3.  The next INR is due 03/22/2010.  Anticoagulation instructions were given to patient.  Results were reviewed/authorized by Bethena Midget, RN, BSN.  He was notified by Bethena Midget, RN, BSN.         Prior Anticoagulation Instructions: INR 3.0 Today take 5mg s then resume 7.5mg s daily. Recheck in 4 weeks.   Current Anticoagulation Instructions: INR 3.8 Skip coumadin today then change dose  to 7.5mg s daily except 5mg s on Sundays. Recheck in 2-3 weeks.

## 2010-12-28 NOTE — Letter (Signed)
Summary: [facility] Office Visit Note   [facility] Office Visit Note   Imported By: Roderic Ovens 07/15/2010 15:57:07  _____________________________________________________________________  External Attachment:    Type:   Image     Comment:   External Document

## 2010-12-28 NOTE — Medication Information (Signed)
Summary: rov/jaj  Anticoagulant Therapy  Managed by: Bethena Midget, RN, BSN Referring MD: Sherryl Manges MD PCP: Link Snuffer MD: Graciela Husbands MD, Viviann Spare Indication 1: Atrial Fibrillation (ICD-427.31) Lab Used: [facility] Site: [facility] INR POC 2.7 INR RANGE 2 - 3  Dietary changes: no    Health status changes: no    Bleeding/hemorrhagic complications: no    Recent/future hospitalizations: no    Any changes in medication regimen? yes       Details: Lyric discontinued  Recent/future dental: no  Any missed doses?: no       Is patient compliant with meds? yes       Current Medications (verified): 1)  Flomax 0.4 Mg  Cp24 (Tamsulosin Hcl) .... Take 1 Tablet By Mouth Two Times A Day 2)  Creon 12000 Unit Cpep (Pancrelipase (Lip-Prot-Amyl)) .... 5 Capsules Per Day 3)  Humulin 70/30 70-30 % Susp (Insulin Isophane & Regular) .Marland Kitchen.. 12 Units Am and 17 Units in The Pm 4)  Centrum Silver   Tabs (Multiple Vitamins-Minerals) .... Take One Tablet Once Daily 5)  Vitamin C 500 Mg  Tabs (Ascorbic Acid) .... Take One Tablet Once Daily 6)  Amlactin Xl   Lotn (Emollient) .... Add Lotion To Legs Once Daily 7)  Glucosamine-Chondroitin   Tabs (Glucosamine-Chondroit-Vit C-Mn) .... Take One Tablet Daily 8)  Tikosyn 500 Mcg Caps (Dofetilide) .Marland Kitchen.. 1 Tablet Every 12 Hours 9)  Carvedilol 12.5 Mg  Tabs (Carvedilol) .... Take 1/2 Tablet By Mouth Two Times A Day 10)  Lisinopril 10 Mg  Tabs (Lisinopril) .... Take One Tablet Once Daily 11)  Hypotears 1-1 %  Soln (Polyethyl Glycol-Polyvinyl Alc) .... Use As Needed 12)  Coumadin 5 Mg  Tabs (Warfarin Sodium) .... Take As Directed By Coumadin Clinic. 13)  Bd Insulin Syringe U-100 1 Ml Misc (Insulin Syringes (Disposable)) .... For Use With Insulin 14)  Triamcinolone Acetonide 0.1 % Crea (Triamcinolone Acetonide) .... Apply To Left Leg Two Times A Day  Allergies: 1)  ! Penicillin 2)  ! * Clindamycin 3)  ! Percocet  Anticoagulation Management History:  The patient is taking warfarin and comes in today for a routine follow up visit.  Positive risk factors for bleeding include an age of 31 years or older, history of GI bleeding, and presence of serious comorbidities.  The bleeding index is 'high risk'.  Positive CHADS2 values include Age > 55 years old and History of Diabetes.  The start date was 04/23/2007.  His last INR was 1.7 RATIO.  Anticoagulation responsible provider: Graciela Husbands MD, Viviann Spare.  INR POC: 2.7.  Cuvette Lot#: [id number].  Exp: 09/2011.    Anticoagulation Management Assessment/Plan:      The patient's current anticoagulation dose is Coumadin 5 mg  tabs: Take as directed by coumadin clinic..  The target INR is 2 - 3.  The next INR is due 10/13/2010.  Anticoagulation instructions were given to patient.  Results were reviewed/authorized by Bethena Midget, RN, BSN.  He was notified by Bethena Midget, RN, BSN.         Prior Anticoagulation Instructions: INR 2.5  Continue taking 1 1/2 tablets everyday except take 1 tablet on Sundays. Re-check INR in 4 weeks.   Current Anticoagulation Instructions: INR 2.7 Continue 7.5mg s daily except 5mg s on Sundays. Recheck in 4 weeks.

## 2010-12-28 NOTE — Medication Information (Signed)
Summary: rov/ewj  Anticoagulant Therapy  Managed by: Cloyde Reams, RN, BSN Referring MD: Sherryl Manges MD PCP: Link Snuffer MD: Jens Som MD, Arlys John Indication 1: Atrial Fibrillation (ICD-427.31) Lab Used: [facility] Site: [facility] INR POC 3.1 INR RANGE 2 - 3  Dietary changes: no    Health status changes: no    Bleeding/hemorrhagic complications: no    Recent/future hospitalizations: no     Recent/future dental: no  Any missed doses?: no       Is patient compliant with meds? yes       Allergies: 1)  ! Penicillin 2)  ! * Clindamycin 3)  ! Percocet  Anticoagulation Management History:      Positive risk factors for bleeding include an age of 75 years or older, history of GI bleeding, and presence of serious comorbidities.  The bleeding index is 'high risk'.  Positive CHADS2 values include Age > 75 years old and History of Diabetes.  The start date was 04/23/2007.  His last INR was 1.7 RATIO.  Anticoagulation responsible provider: Jens Som MD, Arlys John.  INR POC: 3.1.  Cuvette Lot#: [id number].  Exp: 04/2011.    Anticoagulation Management Assessment/Plan:      The patient's current anticoagulation dose is Coumadin 5 mg  tabs: Take as directed by coumadin clinic..  The target INR is 2 - 3.  The next INR is due 05/17/2010.  Anticoagulation instructions were given to patient.  Results were reviewed/authorized by Cloyde Reams, RN, BSN.  He was notified by Alcus Dad B. Pharm.         Prior Anticoagulation Instructions: INR 2.6  Continue on same dosage 1.5 tablets daily except 1 tablet on Sundays.  Recheck in 4 weeks.    Current Anticoagulation Instructions: INR-3.1 take 1 tablet today and  resume normal dosing schedule, Take 1 tablet on Sunday and take 1.5 tablets on all other days of the week.  Return in  4 weeks

## 2010-12-28 NOTE — Medication Information (Signed)
Summary: rov/tm  Anticoagulant Therapy  Managed by: Cloyde Reams, RN, BSN Referring MD: Sherryl Manges MD PCP: Link Snuffer MD: Johney Frame MD, Fayrene Fearing Indication 1: Atrial Fibrillation (ICD-427.31) Lab Used: [facility] Site: [facility] INR POC 2.2 INR RANGE 2 - 3  Dietary changes: no    Health status changes: no    Bleeding/hemorrhagic complications: no    Recent/future hospitalizations: no    Any changes in medication regimen? yes       Details: Added Kepra 250mg  qd then incr to bid       Allergies: 1)  ! Penicillin 2)  ! * Clindamycin 3)  ! Percocet  Anticoagulation Management History:      The patient is taking warfarin and comes in today for a routine follow up visit.  Positive risk factors for bleeding include an age of 75 years or older, history of GI bleeding, and presence of serious comorbidities.  The bleeding index is 'high risk'.  Positive CHADS2 values include Age > 87 years old and History of Diabetes.  The start date was 04/23/2007.  His last INR was 1.7 RATIO.  Anticoagulation responsible provider: Jhovani Griswold MD, Fayrene Fearing.  INR POC: 2.2.  Cuvette Lot#: [id number].  Exp: 07/2011.    Anticoagulation Management Assessment/Plan:      The patient's current anticoagulation dose is Coumadin 5 mg  tabs: Take as directed by coumadin clinic..  The target INR is 2 - 3.  The next INR is due 06/23/2010.  Anticoagulation instructions were given to patient.  Results were reviewed/authorized by Cloyde Reams, RN, BSN.  He was notified by Cloyde Reams RN.         Prior Anticoagulation Instructions: INR 1.5 Today take 10mg s and Tuesday, then resume 7.5mg s everyday except 5mg s on Sundays. Recheck in 2 weeks.   Current Anticoagulation Instructions: INR 2.2  Continue on same dosage 7.5mg  daily except 5mg  on Sundays.  Recheck in 3 weeks.

## 2010-12-28 NOTE — Letter (Signed)
Summary: [facility]  [facility]   Imported By: Lennie Odor 01/06/2010 15:04:23  _____________________________________________________________________  External Attachment:    Type:   Image     Comment:   External Document

## 2010-12-28 NOTE — Assessment & Plan Note (Signed)
Summary: YEARLY FU/ LABS AFTER/NWS   Vital Signs:  Patient profile:   77 year old male Height:      75 inches Weight:      201 pounds BMI:     25.21 O2 Sat:      97 % on Room air Temp:     97.4 degrees F oral Pulse rate:   64 / minute BP sitting:   106 / 56  (left arm) Cuff size:   regular  Vitals Entered By: Bill Salinas CMA (January 04, 2010 10:15 AM)  O2 Flow:  Room air CC: cpx, pt is due for tetanus and has never had zostavax/ ab  Vision Screening:      Vision Comments: Last eye exam was Feb 2011 with Dr Noel Gerold at [facility] care with normal exam  Vision Entered By: Bill Salinas CMA (January 04, 2010 10:18 AM)   Primary Care Provider:  Saketh Daubert  CC:  cpx and pt is due for tetanus and has never had zostavax/ ab.  History of Present Illness: Patient presents for a routine exam.  Headache -daily headache rated as 0-3 for the last 4 months Location is upper nose and between the eyes and they to the forehead. Always feels tight. He has had previous evaluation that was thorough: CT scans without severe sinusitis; saw a neurologist in [place] - no problem identified; saw Santiago Glad and was treated with gabapentin to no avail with increased visual problems. He was diagnosed years ago with tension type headche.  GI- patient reports he has a lot of eructation. He has tried prilosec in the past with relief.  Rhinorrhea - a known side effect of flomax, along with headache. He had stopped finasteride due to mistaken notion that it was causing urinary retention.   BPH- see above.   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 He does not do any routine stretches.  Current Medications (verified): 1)  Flomax 0.4 Mg  Cp24 (Tamsulosin Hcl) .... Take 1 Tablet By Mouth Two Times A Day 2)  Creon 12000 Unit Cpep (Pancrelipase (Lip-Prot-Amyl)) .... 5 Capsules Per Day 3)  Humulin 70/30 70-30 % Susp (Insulin Isophane & Regular) .Marland Kitchen.. 10 Units Am and 16 Units in The Pm 4)  Centrum Silver    Tabs (Multiple Vitamins-Minerals) .... Take One Tablet Once Daily 5)  Vitamin C 500 Mg  Tabs (Ascorbic Acid) .... Take One Tablet Once Daily 6)  Amlactin Xl   Lotn (Emollient) .... Add Lotion To Legs Once Daily 7)  Glucosamine-Chondroitin   Tabs (Glucosamine-Chondroit-Vit C-Mn) .... Take One Tablet Daily 8)  Tikosyn 500 Mcg Caps (Dofetilide) .Marland Kitchen.. 1 Tablet Every 12 Hours 9)  Carvedilol 12.5 Mg  Tabs (Carvedilol) .... Take 1/2 Tablet By Mouth Two Times A Day 10)  Lisinopril 10 Mg  Tabs (Lisinopril) .... Take One Tablet Once Daily 11)  Hypotears 1-1 %  Soln (Polyethyl Glycol-Polyvinyl Alc) .... Use As Needed 12)  Coumadin 5 Mg  Tabs (Warfarin Sodium) .... Take As Directed By Coumadin Clinic. 13)  Bd Insulin Syringe U-100 1 Ml Misc (Insulin Syringes (Disposable)) .... For Use With Insulin 14)  Triamcinolone Acetonide 0.1 % Crea (Triamcinolone Acetonide) .... Apply To Left Leg Two Times A Day 15)  Finasteride 5 Mg Tabs (Finasteride) .Marland Kitchen.. 1 By Mouth Once Daily  Allergies (verified): 1)  ! Penicillin 2)  ! * Clindamycin 3)  ! 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     Past Surgical History: Last updated: 09/19/2007 CARDIOVERSION (ICD-V39.01) CHOLECYSTECTOMY, HX OF (ICD-V45.79) GASTRECTOMY, PARTIAL, HX OF (ICD-V15.2) * LEFT SHOULDER REPAIR WITH 2 PENS INSERTED * WHIPPLE OPERATION * SARCOMA REMOVAL FROM RIGHT TRICEP INGUINAL HERNIORRHAPHY, RIGHT, HX OF (ICD-V45.89) APPENDECTOMY, HX OF (ICD-V45.79)     Family History: Last updated: 2007/11/21 father- died 62, CAD mother - died 56, OA 2 brothers - 1 died  52 cancer; CAD/CABG, AVR,  PVD with stents 4 sisters - 1 died at 8; 1-CAD, multiple problems, fibromyalgia, DM Neg - colon, prostate cancer  Social History: Last updated: 2007-11-21 [employer] married 1954 2 sons - 1956, 33; 1 daughter 29 6 grandchildren retired- Runner, broadcasting/film/video  Risk Factors: Caffeine Use: 2 (2007/11/21) Exercise: yes (11-21-07)  Risk Factors: Smoking Status: quit (09/19/2007) Passive Smoke Exposure: no (11/21/2007)  Review of Systems  The patient denies anorexia, fever, weight loss, weight gain, decreased hearing, chest pain, dyspnea on exertion, prolonged cough, abdominal pain, hematochezia, hematuria, muscle weakness, difficulty walking, depression, abnormal bleeding, and angioedema.    Physical Exam  General:  Tall white male in no distress Head:  Normocephalic and atraumatic without obvious abnormalities. No apparent alopecia or balding. Eyes:  deferred to Dr. Dione Booze Ears:  External ear exam shows no significant lesions or deformities.  Otoscopic examination reveals clear canals, tympanic membranes are intact bilaterally without bulging, retraction, inflammation or discharge. Hearing is grossly normal bilaterally. Nose:  External nasal examination shows no deformity or inflammation. Nasal mucosa are pink and moist without lesions or exudates. Mouth:  missing many molars on the mandible. No buccal lesions. Posterior phyarnx clear Neck:  supple, full ROM, and no thyromegaly.   Chest Wall:  no ddeformities Lungs:  Normal respiratory effort, chest expands symmetrically. Lungs are clear to auscultation, no crackles or wheezes. Heart:  normal rate, regular rhythm, no murmur, no JVD, and no HJR.   Abdomen:  soft, non-tender, normal bowel sounds, no masses, no guarding, no rigidity, and no hepatomegaly.   Rectal:  deferred Prostate:  deferred Msk:  normal ROM, no joint tenderness, no joint swelling, no joint warmth, no redness over  joints, no joint instability, and no crepitation.   Pulses:  2+ radial pulses Extremities:  No clubbing, cyanosis, edema, or deformity noted with normal full range of motion of all joints.   Neurologic:  alert & oriented X3, cranial nerves II-XII intact, strength normal in all extremities, sensation intact to light touch, sensation intact to pinprick, gait normal, and DTRs symmetrical and normal.   Skin:  turgor normal, color normal, no rashes, and no petechiae.   Cervical Nodes:  no anterior cervical adenopathy and no posterior cervical adenopathy.   Psych:  Oriented X3, memory intact for recent and remote, normally interactive, and good eye contact.     Impression & Recommendations:  Problem # 1:  HEADACHE (ICD-784.0) Tension type headache but he cannot take NSAIDs due to warfarin.  Plan - refer to Dr. Clarisse Gouge for eval and treatment. May be a candidate for botox therapy.  His updated medication list for this problem includes:    Carvedilol 12.5 Mg Tabs (Carvedilol) .Marland Kitchen... Take 1/2 tablet by mouth two times a day  Orders: Neurology Referral (Neuro)  Problem # 2:  ATRIAL FIBRILLATION,  PAROXYSMAL (ICD-427.31) In sinus rhytm. He is followed closely by Cardiology.  His updated medication list for this problem includes:    Tikosyn 500 Mcg Caps (Dofetilide) .Marland Kitchen... 1 tablet every 12 hours    Carvedilol 12.5 Mg Tabs (Carvedilol) .Marland Kitchen... Take 1/2 tablet by mouth two times a day    Coumadin 5 Mg Tabs (Warfarin sodium) .Marland Kitchen... Take as directed by coumadin clinic.  Problem # 3:  ABDOMINAL BLOATING (ICD-787.3) Patient with eructation and bloats most likely acid related. He has used PPI therapy in the past. NO acute pain or significant change in GI function.  Plan - trial of H2 blocker, e.g. generic zantac  150mg  two times a day.  Problem # 4:  BENIGN PROSTATIC HYPERTROPHY, HX OF (ICD-V13.8) Patinet with signs of prostatism and incomplete relief with flomax. He was on finasteride in the past which he  stopped thinking it was the cause of urinary retention.  Plan - resume finasteride 5mg  once daily.           continue flomax for 6-8 weeks then a trial without.  Orders: TLB-PSA (Prostate Specific Antigen) (84153-PSA)  Problem # 5:  DIABETES MELLITUS (ICD-250.00) For lab today. He does admit to less than a strict diet. Re-enforced the concept of strict life-style management as the bedrock of treatment.   His updated medication list for this problem includes:    Humulin 70/30 70-30 % Susp (Insulin isophane & regular) .Marland KitchenMarland KitchenMarland KitchenMarland Kitchen 10 units am and 16 units in the pm    Lisinopril 10 Mg Tabs (Lisinopril) .Marland Kitchen... Take one tablet once daily  Orders: TLB-BMP (Basic Metabolic Panel-BMET) (80048-METABOL) TLB-A1C / Hgb A1C (Glycohemoglobin) (83036-A1C)  Addendum - A1C 7% and at goal.  Plan - continue present regimen  Problem # 6:  LOW BACK PAIN, CHRONIC (ICD-724.2) Patgient with chronic intermittent low back pain.  Plan - provided back stretching exercise pamphlet - to develop a daily back program to reduce pain  Problem # 7:  ALLERGIC RHINITIS CAUSE UNSPECIFIED (ICD-477.9)  Patient with chronic rhinorrhea. may be related to flomax.  Plan - trial of otc antihistamine - e.g. loratadine           will see if symptoms improve when he comes off flomax.   Orders: Prescription Created Electronically [phone])  Problem # 8:  Preventive Health Care (ICD-V70.0) Unremakable exam. Lab results are fine, with a normal PSA. Current with colorectal cancer screening. He is a candidate for pneumonia vaccine.  In summary - a very nice man who is in pretty good condition for the condition he is in. He will return as needed.   Complete Medication List: 1)  Flomax 0.4 Mg Cp24 (Tamsulosin hcl) .... Take 1 tablet by mouth two times a day 2)  Creon 12000 Unit Cpep (Pancrelipase (lip-prot-amyl)) .... 5 capsules per day 3)  Humulin 70/30 70-30 % Susp (Insulin isophane & regular) .Marland Kitchen.. 10 units am and 16 units in the  pm 4)  Centrum Silver Tabs (Multiple vitamins-minerals) .... Take one tablet once daily 5)  Vitamin C 500 Mg Tabs (Ascorbic acid) .... Take one tablet once daily 6)  Amlactin Xl Lotn (Emollient) .... Add lotion to legs once daily 7)  Glucosamine-chondroitin Tabs (Glucosamine-chondroit-vit c-mn) .... Take one tablet daily 8)  Tikosyn 500 Mcg Caps (Dofetilide) .Marland Kitchen.. 1 tablet every 12 hours 9)  Carvedilol 12.5 Mg Tabs (Carvedilol) .... Take 1/2 tablet by mouth two times a day 10)  Lisinopril 10 Mg Tabs (Lisinopril) .... Take one tablet once daily 11)  Hypotears 1-1 % Soln (Polyethyl glycol-polyvinyl alc) .... Use as needed 12)  Coumadin 5 Mg Tabs (Warfarin sodium) .... Take as directed by coumadin clinic. 13)  Bd Insulin Syringe U-100 1 Ml Misc (Insulin syringes (disposable)) .... For use with insulin 14)  Triamcinolone Acetonide 0.1 % Crea (Triamcinolone acetonide) .... Apply to left leg two times a day 15)  Finasteride 5 Mg Tabs (Finasteride) .Marland Kitchen.. 1 by mouth once daily  Other Orders: TLB-CBC Platelet - w/Differential (85025-CBCD) TLB-Lipid Panel (80061-LIPID) TLB-Hepatic/Liver Function Pnl (80076-HEPATIC)  Patient: [name] Note: All result statuses are Final unless otherwise noted.  Tests: (1) CBC Platelet w/Diff (CBCD)   White Cell Count          7.0 K/uL                    4.5-10.5   Red Cell Count       [L]  3.91 Mil/uL                 4.22-5.81   Hemoglobin           [L]  12.9 g/dL                   08.6-57.8   Hematocrit                39.8 %                      39.0-52.0   MCV                  [H]  101.6 fl                    78.0-100.0   MCHC                      32.5 g/dL                   46.9-62.9   RDW                       12.6 %                      11.5-14.6   Platelet Count            171.0 K/uL                  150.0-400.0   Neutrophil %              70.2 %                      43.0-77.0   Lymphocyte %              14.3 %                      12.0-46.0   Monocyte  %                10.8 %                      3.0-12.0   Eosinophils%              4.6 %                       0.0-5.0   Basophils %  0.1 %                       0.0-3.0   Neutrophill Absolute      4.9 K/uL                    1.4-7.7   Lymphocyte Absolute       1.0 K/uL                    0.7-4.0   Monocyte Absolute         0.8 K/uL                    0.1-1.0  Eosinophils, Absolute                             0.3 K/uL                    0.0-0.7   Basophils Absolute        0.0 K/uL                    0.0-0.1  Tests: (2) BMP (METABOL)   Sodium                    139 mEq/L                   135-145   Potassium                 4.7 mEq/L                   3.5-5.1   Chloride                  102 mEq/L                   96-112   Carbon Dioxide            30 mEq/L                    19-32   Glucose              [H]  136 mg/dL                   16-10   BUN                       18 mg/dL                    9-60   Creatinine                1.0 mg/dL                   4.5-4.0   Calcium                   9.5 mg/dL                   9.8-11.9   GFR                       75.92 mL/min                >60  Tests: (3) Hemoglobin A1C (A1C)   Hemoglobin A1C       [  H]  7.0 %                       4.6-6.5     Glycemic Control Guidelines for People with Diabetes:     Non Diabetic:  <6%     Goal of Therapy: <7%     Additional Action Suggested:  >8%   Tests: (4) Lipid Panel (LIPID)   Cholesterol               178 mg/dL                   2-130     ATP III Classification            Desirable:  < 200 mg/dL                    Borderline High:  200 - 239 mg/dL               High:  > = 240 mg/dL   Triglycerides             61.0 mg/dL                  8.6-578.4     Normal:  <150 mg/dL     Borderline High:  696 - 199 mg/dL   HDL                       29.52 mg/dL                 >84.13   VLDL Cholesterol          12.2 mg/dL                  2.4-40.1   LDL Cholesterol      [H]  027 mg/dL                    2-53  CHO/HDL Ratio:  CHD Risk                             4                    Men          Women     1/2 Average Risk     3.4          3.3     Average Risk          5.0          4.4     2X Average Risk          9.6          7.1     3X Average Risk          15.0          11.0                           Tests: (5) Hepatic/Liver Function Panel (HEPATIC)   Total Bilirubin           0.8 mg/dL                   6.6-4.4   Direct Bilirubin          0.2 mg/dL  0.0-0.3   Alkaline Phosphatase [H]  123 U/L                     39-117   AST                       29 U/L                      0-37   ALT                       23 U/L                      0-53   Total Protein             7.7 g/dL                    4.0-9.8   Albumin                   4.1 g/dL                    1.1-9.1  Tests: (6) Prostate Specific Antigen (PSA)   PSA-Hyb                   2.28 ng/mL                  0.10-4.00  Patient Instructions: 1)  Headache - sounds like tension headache. May not use NSAIDs long term. Plan - refer to Dr. Clarisse Gouge for consult and possible treatment (botox) 2)  Eructation (gas) suspect it is an acid related problem. Plan - trial of zantac 150mg  (generic ranitidine) AM and PM. If this works we can send off for a 90 day supple 3)  Prostate enlargement - plan is to restart finasteride 5mg  daily. After about 8 weeks will reduce flomax to once a day and if things go well will try to discontinue flomax. 4)  Sinus - rhinorrhea and sinusitis are side affects of flomax. See above discussion. 5)  Chronic back pain - recommend stretch or flex. See pamphlet. Prescriptions: HUMULIN 70/30 70-30 % SUSP (INSULIN ISOPHANE & REGULAR) 10 units am and 16 units in the pm  #4 x 3   Entered by:   Ami Bullins CMA   Authorized by:   Jacques Navy MD   Signed by:   Bill Salinas CMA on 01/04/2010   Method used:   Faxed to ...       [facility]* (mail-order)       [address]  [place], [place]  [address]       Ph: [phone]       Fax: [phone]   RxID:   [id number] CREON 12000 UNIT CPEP (PANCRELIPASE (LIP-PROT-AMYL)) 5 capsules per day  #450 x 3   Entered by:   Ami Bullins CMA   Authorized by:   Jacques Navy MD   Signed by:   Bill Salinas CMA on 01/04/2010   Method used:   Faxed to ...       [facility]* (mail-order)       [address]       [place], [place]  [address]       Ph: [phone]       Fax: [phone]   RxID:  [id number] FLOMAX 0.4 MG  CP24 (TAMSULOSIN HCL) Take 1 tablet by mouth two times a day  #180 x 5   Entered by:   Ami Bullins CMA   Authorized by:   Jacques Navy MD   Signed by:   Bill Salinas CMA on 01/04/2010   Method used:   Faxed to ...       [facility]* (mail-order)       [address]       [place], [place]  [address]       Ph: [phone]       Fax: [phone]   RxID:   [id number] BD INSULIN SYRINGE U-100 1 ML MISC (INSULIN SYRINGES (DISPOSABLE)) for use with insulin  #200 x 3   Entered and Authorized by:   Jacques Navy MD   Signed by:   Jacques Navy MD on 01/04/2010   Method used:   Electronically to        [facility]* (mail-order)       [address]       [place], [place]  [address]       Ph: [phone]       Fax: [phone]   RxID:   [id number] HUMULIN 70/30 70-30 % SUSP (INSULIN ISOPHANE & REGULAR) 10 units am and 16 units in the pm  #4 x 3   Entered and Authorized by:   Jacques Navy MD   Signed by:   Jacques Navy MD on 01/04/2010   Method used:   Electronically to        [facility]* (mail-order)       [address]       [place], [place]  [address]       Ph: [phone]       Fax: [phone]   RxID:   [id number] CREON 12000 UNIT CPEP (PANCRELIPASE (LIP-PROT-AMYL)) 5 capsules per day  #450 x 3   Entered and Authorized by:   Jacques Navy MD   Signed by:   Jacques Navy  MD on 01/04/2010   Method used:   Electronically to        [facility]* (mail-order)       [address]       [place], [place]  [address]       Ph: [phone]       Fax: [phone]   RxID:   [id number] FLOMAX 0.4 MG  CP24 (TAMSULOSIN HCL) Take 1 tablet by mouth two times a day  #180 x 5   Entered and Authorized by:   Jacques Navy MD   Signed by:   Jacques Navy MD on 01/04/2010   Method used:   Electronically to        [facility]* (mail-order)       [address]       [place], [place]  [address]       Ph: [phone]       Fax: [phone]   RxID:   [id number] FINASTERIDE 5 MG TABS (FINASTERIDE) 1 by mouth once daily  #90 x 3   Entered and Authorized by:   Jacques Navy MD   Signed by:   Jacques Navy MD on 01/04/2010   Method used:   Electronically to        [facility]* (mail-order)       [address]  [place], [place]  [address]       Ph: [phone]       Fax: [phone]   RxID:   [id number]    Preventive Care Screening  Last Flu Shot:    Date:  08/29/2009    Results:  given

## 2010-12-28 NOTE — Medication Information (Signed)
Summary: rov/ewj  Anticoagulant Therapy  Managed by: Bethena Midget, RN, BSN Referring MD: Sherryl Manges MD PCP: Link Snuffer MD: Daleen Squibb MD, Maisie Fus Indication 1: Atrial Fibrillation (ICD-427.31) Lab Used: [facility] Site: [facility] INR POC 3.0 INR RANGE 2 - 3  Dietary changes: no    Health status changes: no    Bleeding/hemorrhagic complications: no    Recent/future hospitalizations: no    Any changes in medication regimen? yes       Details: Keppra increased to 2 tabs daily   Recent/future dental: no  Any missed doses?: no       Is patient compliant with meds? yes       Current Medications (verified): 1)  Flomax 0.4 Mg  Cp24 (Tamsulosin Hcl) .... Take 1 Tablet By Mouth Two Times A Day 2)  Creon 12000 Unit Cpep (Pancrelipase (Lip-Prot-Amyl)) .... 5 Capsules Per Day 3)  Humulin 70/30 70-30 % Susp (Insulin Isophane & Regular) .Marland Kitchen.. 11 Units Am and 17 Units in The Pm 4)  Centrum Silver   Tabs (Multiple Vitamins-Minerals) .... Take One Tablet Once Daily 5)  Vitamin C 500 Mg  Tabs (Ascorbic Acid) .... Take One Tablet Once Daily 6)  Amlactin Xl   Lotn (Emollient) .... Add Lotion To Legs Once Daily 7)  Glucosamine-Chondroitin   Tabs (Glucosamine-Chondroit-Vit C-Mn) .... Take One Tablet Daily 8)  Tikosyn 500 Mcg Caps (Dofetilide) .Marland Kitchen.. 1 Tablet Every 12 Hours 9)  Carvedilol 12.5 Mg  Tabs (Carvedilol) .... Take 1/2 Tablet By Mouth Two Times A Day 10)  Lisinopril 10 Mg  Tabs (Lisinopril) .... Take One Tablet Once Daily 11)  Hypotears 1-1 %  Soln (Polyethyl Glycol-Polyvinyl Alc) .... Use As Needed 12)  Coumadin 5 Mg  Tabs (Warfarin Sodium) .... Take As Directed By Coumadin Clinic. 13)  Bd Insulin Syringe U-100 1 Ml Misc (Insulin Syringes (Disposable)) .... For Use With Insulin 14)  Triamcinolone Acetonide 0.1 % Crea (Triamcinolone Acetonide) .... Apply To Left Leg Two Times A Day 15)  Finasteride 5 Mg Tabs (Finasteride) .Marland Kitchen.. 1 By Mouth Once Daily 16)  Nortriptyline Hcl 10 Mg  Caps (Nortriptyline Hcl) .... Take 2 Capsules Daily. 17)  Ranitidine Hcl 150 Mg Tabs (Ranitidine Hcl) .... Take 1 Tablet By Mouth Two Times A Day 18)  Keppra 250 Mg Tabs (Levetiracetam) .... Take 2 Tablets By Mouth Once A Day  Allergies: 1)  ! Penicillin 2)  ! * Clindamycin 3)  ! Percocet  Anticoagulation Management History:      The patient is taking warfarin and comes in today for a routine follow up visit.  Positive risk factors for bleeding include an age of 103 years or older, history of GI bleeding, and presence of serious comorbidities.  The bleeding index is 'high risk'.  Positive CHADS2 values include Age > 27 years old and History of Diabetes.  The start date was 04/23/2007.  His last INR was 1.7 RATIO.  Anticoagulation responsible provider: Daleen Squibb MD, Maisie Fus.  INR POC: 3.0.  Cuvette Lot#: [id number].  Exp: 08/2011.    Anticoagulation Management Assessment/Plan:      The patient's current anticoagulation dose is Coumadin 5 mg  tabs: Take as directed by coumadin clinic..  The target INR is 2 - 3.  The next INR is due 07/21/2010.  Anticoagulation instructions were given to patient.  Results were reviewed/authorized by Bethena Midget, RN, BSN.  He was notified by Bethena Midget, RN, BSN.         Prior Anticoagulation Instructions: INR 2.2  Continue on same dosage 7.5mg  daily except 5mg  on Sundays.  Recheck in 3 weeks.    Current Anticoagulation Instructions: INR 3.0 Today take 5mg s then resume 7.5mg s everyday except 5mg s on Sundays. Recheck in 4 weeks.

## 2010-12-28 NOTE — Medication Information (Signed)
Summary: rov/tm  Anticoagulant Therapy  Managed by: Cloyde Reams, RN, BSN Referring MD: Sherryl Manges MD PCP: Link Snuffer MD: Excell Seltzer MD, Casimiro Needle Indication 1: Atrial Fibrillation (ICD-427.31) Lab Used: [facility] Site: [facility] INR POC 2.6 INR RANGE 2 - 3  Dietary changes: no    Health status changes: no    Bleeding/hemorrhagic complications: no    Recent/future hospitalizations: no    Any changes in medication regimen? no    Recent/future dental: no  Any missed doses?: no       Is patient compliant with meds? yes       Current Medications (verified): 1)  Flomax 0.4 Mg  Cp24 (Tamsulosin Hcl) .... Take 1 Tablet By Mouth Two Times A Day 2)  Creon 12000 Unit Cpep (Pancrelipase (Lip-Prot-Amyl)) .... 5 Capsules Per Day 3)  Humulin 70/30 70-30 % Susp (Insulin Isophane & Regular) .Marland Kitchen.. 10 Units Am and 16 Units in The Pm 4)  Centrum Silver   Tabs (Multiple Vitamins-Minerals) .... Take One Tablet Once Daily 5)  Vitamin C 500 Mg  Tabs (Ascorbic Acid) .... Take One Tablet Once Daily 6)  Amlactin Xl   Lotn (Emollient) .... Add Lotion To Legs Once Daily 7)  Glucosamine-Chondroitin   Tabs (Glucosamine-Chondroit-Vit C-Mn) .... Take One Tablet Daily 8)  Tikosyn 500 Mcg Caps (Dofetilide) .Marland Kitchen.. 1 Tablet Every 12 Hours 9)  Carvedilol 12.5 Mg  Tabs (Carvedilol) .... Take 1/2 Tablet By Mouth Two Times A Day 10)  Lisinopril 10 Mg  Tabs (Lisinopril) .... Take One Tablet Once Daily 11)  Hypotears 1-1 %  Soln (Polyethyl Glycol-Polyvinyl Alc) .... Use As Needed 12)  Coumadin 5 Mg  Tabs (Warfarin Sodium) .... Take As Directed By Coumadin Clinic. 13)  Bd Insulin Syringe U-100 1 Ml Misc (Insulin Syringes (Disposable)) .... For Use With Insulin 14)  Triamcinolone Acetonide 0.1 % Crea (Triamcinolone Acetonide) .... Apply To Left Leg Two Times A Day 15)  Finasteride 5 Mg Tabs (Finasteride) .Marland Kitchen.. 1 By Mouth Once Daily 16)  Nortriptyline Hcl 10 Mg Caps (Nortriptyline Hcl) .... Take 3 Capsules  Daily. 17)  Ranitidine Hcl 150 Mg Tabs (Ranitidine Hcl) .... Take 1 Tablet By Mouth Two Times A Day  Allergies (verified): 1)  ! Penicillin 2)  ! * Clindamycin 3)  ! Percocet  Anticoagulation Management History:      The patient is taking warfarin and comes in today for a routine follow up visit.  Positive risk factors for bleeding include an age of 25 years or older, history of GI bleeding, and presence of serious comorbidities.  The bleeding index is 'high risk'.  Positive CHADS2 values include Age > 73 years old and History of Diabetes.  The start date was 04/23/2007.  His last INR was 1.7 RATIO.  Anticoagulation responsible provider: Excell Seltzer MD, Casimiro Needle.  INR POC: 2.6.  Cuvette Lot#: [id number].  Exp: 04/2011.    Anticoagulation Management Assessment/Plan:      The patient's current anticoagulation dose is Coumadin 5 mg  tabs: Take as directed by coumadin clinic..  The target INR is 2 - 3.  The next INR is due 04/19/2010.  Anticoagulation instructions were given to patient.  Results were reviewed/authorized by Cloyde Reams, RN, BSN.  He was notified by Cloyde Reams RN.         Prior Anticoagulation Instructions: INR 3.8 Skip coumadin today then change dose  to 7.5mg s daily except 5mg s on Sundays. Recheck in 2-3 weeks.   Current Anticoagulation Instructions: INR 2.6  Continue on  same dosage 1.5 tablets daily except 1 tablet on Sundays.  Recheck in 4 weeks.

## 2010-12-28 NOTE — Medication Information (Signed)
Summary: rov/sl  Anticoagulant Therapy  Managed by: [name], PharmD Referring MD: Casey Manges MD PCP: Casey Snuffer MD: Casey Chance MD, [name] Indication 1: Atrial Fibrillation (ICD-427.31) Lab Used: [facility] Site: [facility] INR POC 2.5 INR RANGE 2 - 3  Dietary changes: no    Health status changes: no    Bleeding/hemorrhagic complications: no    Recent/future hospitalizations: no    Any changes in medication regimen? no    Recent/future dental: no  Any missed doses?: no       Is patient compliant with meds? yes       Allergies: 1)  ! Penicillin 2)  ! * Clindamycin 3)  ! Percocet  Anticoagulation Management History:      The patient is taking warfarin and comes in today for a routine follow up visit.  Positive risk factors for bleeding include an age of 75 years or older, history of GI bleeding, and presence of serious comorbidities.  The bleeding index is 'high risk'.  Positive CHADS2 values include Age > 23 years old and History of Diabetes.  The start date was 04/23/2007.  His last INR was 1.7 RATIO.  Anticoagulation responsible [name]: Casey Chance MD, [name].  INR POC: 2.5.  Cuvette Lot#: [id number].  Exp: 08/2011.    Anticoagulation Management Assessment/Plan:      The patient's current anticoagulation dose is Coumadin 5 mg  tabs: Take as directed by coumadin clinic..  The target INR is 2 - 3.  The next INR is due 09/15/2010.  Anticoagulation instructions were given to patient.  Results were reviewed/authorized by [name], PharmD.  He was notified by [name], PharmD candidate.         Prior Anticoagulation Instructions: INR 2.6  Continue taking Coumadin 1.5 tabs (7.5 mg) on all days except Coumadin 1 tab (5 mg) on Sundays.  Return to clinic in 4 weeks.   Current Anticoagulation Instructions: INR 2.5  Continue taking 1 1/2 tablets everyday except take 1 tablet on Sundays. Re-check INR in 4 weeks.

## 2010-12-28 NOTE — Medication Information (Signed)
Summary: rov/eh  Anticoagulant Therapy  Managed by: Bethena Midget, RN, BSN Referring MD: Sherryl Manges MD PCP: Link Snuffer MD: Daleen Squibb MD, Maisie Fus Indication 1: Atrial Fibrillation (ICD-427.31) Lab Used[facility]  Site: [facility] INR POC 2.0 INR RANGE 2 - 3  Dietary changes: no    Health status changes: no    Bleeding/hemorrhagic complications: no    Recent/future hospitalizations: no    Any changes in medication regimen? no    Recent/future dental: no  Any missed doses?: no       Is patient compliant with meds? yes       Allergies: 1)  ! Penicillin 2)  ! * Clindamycin 3)  ! Percocet  Anticoagulation Management History:      The patient comes in today for his initial visit for anticoagulation therapy.  Positive risk factors for bleeding include an age of 75 years or older, history of GI bleeding, and presence of serious comorbidities.  The bleeding index is 'high risk'.  Positive CHADS2 values include Age > 60 years old and History of Diabetes.  The start date was 04/23/2007.  His last INR was 1.7 RATIO.  Anticoagulation responsible provider: Daleen Squibb MD, Maisie Fus.  INR POC: 2.0.  Cuvette Lot#: [id number].  Exp: 12/2010.    Anticoagulation Management Assessment/Plan:      The patient's current anticoagulation dose is Coumadin 5 mg  tabs: Take as directed by coumadin clinic..  The target INR is 2 - 3.  The next INR is due 01/08/2010.  Anticoagulation instructions were given to patient.  Results were reviewed/authorized by Bethena Midget, RN, BSN.  He was notified by Bethena Midget, RN, BSN.         Prior Anticoagulation Instructions: INR 1.6  Take 2 tablets today then increase dose to 1.5 tablets everyday. Recheck in 2 weeks.  Current Anticoagulation Instructions: INR 2.0 Continue 7.5mg s daily. Recheck in 3 weeks.

## 2010-12-28 NOTE — Progress Notes (Signed)
Summary: Started on Keppra  Phone Note Call from Patient   Caller: Patient Call For: [name] Summary of Call: Pt saw DR Vela Prose, started pt on Keppre 250mg  daily, to incr to two times a day. Pt wants to make sure with Dr [name] this medication is OK to take with cardiac meds. Please call pt and advise. Thanks Initial call taken by: Cloyde Reams RN,  June 02, 2010 11:35 AM  Follow-up for Phone Call        I called the pt and left a message on his voice mail that Dr. Graciela Hoffman will be here friday and it will probably be friday before we can call back. Sherri Rad, RN, BSN  June 02, 2010 11:42 AM   Additional Follow-up for Phone Call Additional follow up Details #1::        Spoke with pt's wife.  Informed her that the Keppra will not interact with any of his other medications listed.  She will let the patient know.  Additional Follow-up by: [name] PharmD,  June 04, 2010 4:17 PM

## 2010-12-28 NOTE — Medication Information (Signed)
Summary: rov/tm  Anticoagulant Therapy  Managed by: [name], PharmD Referring MD: Sherryl Manges MD PCP: Link Snuffer MD: Antoine Poche MD, Fayrene Fearing Indication 1: Atrial Fibrillation (ICD-427.31) Lab Used: [facility] Site: [facility] INR POC 1.6 INR RANGE 2 - 3  Dietary changes: no    Health status changes: no    Bleeding/hemorrhagic complications: no    Recent/future hospitalizations: no    Any changes in medication regimen? no    Recent/future dental: no  Any missed doses?: no       Is patient compliant with meds? yes       Allergies: 1)  ! Penicillin 2)  ! * Clindamycin 3)  ! Percocet  Anticoagulation Management History:      The patient is taking warfarin and comes in today for a routine follow up visit.  Positive risk factors for bleeding include an age of 75 years or older, history of GI bleeding, and presence of serious comorbidities.  The bleeding index is 'high risk'.  Positive CHADS2 values include Age > 75 years old and History of Diabetes.  The start date was 04/23/2007.  His last INR was 1.7 RATIO.  Anticoagulation responsible provider: Antoine Poche MD, Fayrene Fearing.  INR POC: 1.6.  Cuvette Lot#: [id number].  Exp: 12/2010.    Anticoagulation Management Assessment/Plan:      The patient's current anticoagulation dose is Coumadin 5 mg  tabs: Take as directed by coumadin clinic..  The target INR is 2 - 3.  The next INR is due 12/18/2009.  Anticoagulation instructions were given to patient.  Results were reviewed/authorized by [name], PharmD.  He was notified by Lew Dawes, PharmD Candidate.         Prior Anticoagulation Instructions: INR 2.1 Continue 7.5mg s daily except 5mg s on Mondays. Recheck in 4 weeks.   Current Anticoagulation Instructions: INR 1.6  Take 2 tablets today then increase dose to 1.5 tablets everyday. Recheck in 2 weeks.

## 2010-12-28 NOTE — Letter (Signed)
Summary: [facility]  [facility]   Imported By: Sherian Rein 03/25/2010 09:15:13  _____________________________________________________________________  External Attachment:    Type:   Image     Comment:   External Document

## 2010-12-28 NOTE — Assessment & Plan Note (Signed)
Summary: yearly/sl  Medications Added HUMULIN 70/30 70-30 % SUSP (INSULIN ISOPHANE & REGULAR) 12 units am and 17 units in the pm        Visit Type:  Follow-up Primary Provider:  [facility]   History of Present Illness: [name] is seen in followup for paroxysmal atrial fibrillation for which he takes Tikosyn. he takes Coumadin and prothrombin embolic risk reduction given his history of hypertension diabetes and age.  He continues to do amazingly well, notwithstanding the constellation of his past medical history The patient denies SOB, chest pain, edema or palpitations His major issue is a return of his chronic headaches. which interestingly had improved significantly on coumadin.  MRI done in the last two weeks was wtout evidence of bleeding  Current Medications (verified): 1)  Flomax 0.4 Mg  Cp24 (Tamsulosin Hcl) .... Take 1 Tablet By Mouth Two Times A Day 2)  Creon 12000 Unit Cpep (Pancrelipase (Lip-Prot-Amyl)) .... 5 Capsules Per Day 3)  Humulin 70/30 70-30 % Susp (Insulin Isophane & Regular) .Marland Kitchen.. 12 Units Am and 17 Units in The Pm 4)  Centrum Silver   Tabs (Multiple Vitamins-Minerals) .... Take One Tablet Once Daily 5)  Vitamin C 500 Mg  Tabs (Ascorbic Acid) .... Take One Tablet Once Daily 6)  Amlactin Xl   Lotn (Emollient) .... Add Lotion To Legs Once Daily 7)  Glucosamine-Chondroitin   Tabs (Glucosamine-Chondroit-Vit C-Mn) .... Take One Tablet Daily 8)  Tikosyn 500 Mcg Caps (Dofetilide) .Marland Kitchen.. 1 Tablet Every 12 Hours 9)  Carvedilol 12.5 Mg  Tabs (Carvedilol) .... Take 1/2 Tablet By Mouth Two Times A Day 10)  Lisinopril 10 Mg  Tabs (Lisinopril) .... Take One Tablet Once Daily 11)  Hypotears 1-1 %  Soln (Polyethyl Glycol-Polyvinyl Alc) .... Use As Needed 12)  Coumadin 5 Mg  Tabs (Warfarin Sodium) .... Take As Directed By Coumadin Clinic. 13)  Bd Insulin Syringe U-100 1 Ml Misc (Insulin Syringes (Disposable)) .... For Use With Insulin 14)  Triamcinolone Acetonide 0.1 % Crea  (Triamcinolone Acetonide) .... Apply To Left Leg Two Times A Day 15)  Lyrica 50 Mg .... Take 1 Capsule Daily.  Allergies: 1)  ! Penicillin 2)  ! * Clindamycin 3)  ! 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     Vital Signs:  Patient profile:   77 year old male Height:      75 inches Weight:      202 pounds BMI:     25.34 Pulse rate:   63 / minute BP sitting:   112 / 58  (left arm)  Vitals Entered By: Laurance Flatten CMA (August 26, 2010 11:10 AM)  Physical Exam  General:  The patient was alert and oriented in no acute distress. HEENT Normal.  Neck veins were flat, carotids were brisk.  Lungs were clear.  Heart sounds were regular without murmurs or gallops.  Abdomen was soft with active bowel sounds. There is no clubbing cyanosis or edema. Skin Warm and dry    Impression & Recommendations:  Problem # 1:  ATRIAL FIBRILLATION, PAROXYSMAL (ICD-427.31) no recurrnt atrial fibrillation his ecg demonstrates QT c of 490 msec.  We will check Bmet(K)and Mg level today  His updated medication list for  this problem includes:    Tikosyn 500 Mcg Caps (Dofetilide) .Marland Kitchen... 1 tablet every 12 hours    Carvedilol 12.5 Mg Tabs (Carvedilol) .Marland Kitchen... Take 1/2 tablet by mouth two times a day    Coumadin 5 Mg Tabs (Warfarin sodium) .Marland Kitchen... Take as directed by coumadin clinic.  Orders: EKG w/ Interpretation (93000) TLB-BMP (Basic Metabolic Panel-BMET) (80048-METABOL) TLB-Magnesium (Mg) (83735-MG)  Problem # 2:  QT PROLONGATION (ICD-794.31) as above His updated medication list for this problem includes:    Tikosyn 500  Mcg Caps (Dofetilide) .Marland Kitchen... 1 tablet every 12 hours    Carvedilol 12.5 Mg Tabs (Carvedilol) .Marland Kitchen... Take 1/2 tablet by mouth two times a day    Lisinopril 10 Mg Tabs (Lisinopril) .Marland Kitchen... Take one tablet once daily    Coumadin 5 Mg Tabs (Warfarin sodium) .Marland Kitchen... Take as directed by coumadin clinic.  Problem # 3:  HEADACHE (ICD-784.0) we discussed treatment options and the potential adverse affects of chronic frequent NSAIDS and tylenol. His updated medication list for this problem includes:    Carvedilol 12.5 Mg Tabs (Carvedilol) .Marland Kitchen... Take 1/2 tablet by mouth two times a day  Patient Instructions: 1)  Your physician recommends that you have BMET and Mag drawn today.  2)  Your physician wants you to follow-up in: 6 months  You will receive a reminder letter in the mail two months in advance. If you don't receive a letter, please call our office to schedule the follow-up appointment. Prescriptions: LISINOPRIL 10 MG  TABS (LISINOPRIL) Take one tablet once daily  #90 x 3   Entered by:   Casey Gladden RN   Authorized by:   Nathen May, MD, [facility]   Signed by:   Casey Gladden RN on 08/26/2010   Method used:   Electronically to        [facility]* (mail-order)       [address]       [place], [place]  [address]       Ph: [phone]       Fax: [phone]   RxID:   [id number] CARVEDILOL 12.5 MG  TABS (CARVEDILOL) Take 1/2 tablet by mouth two times a day  #90 x 3   Entered by:   Casey Gladden RN   Authorized by:   Nathen May, MD, [facility]   Signed by:   Casey Gladden RN on 08/26/2010   Method used:   Electronically to        [facility]* (mail-order)       [address]       [place], [place]  [address]       Ph: [phone]       Fax: [phone]   RxID:   [id number] TIKOSYN 500 MCG CAPS (DOFETILIDE) 1 tablet every 12 hours  #180 x 3   Entered by:   Casey Gladden RN   Authorized by:   Nathen May, MD, [facility]    Signed by:   Casey Gladden RN on 08/26/2010   Method used:   Electronically to        [facility]* (mail-order)       [address] [place]  [address]       Ph: [phone]       Fax: [phone]   RxID:   [id number]

## 2010-12-28 NOTE — Consult Note (Signed)
Summary: [facility]  [facility]   Imported By: Sherian Rein 02/15/2010 09:48:02  _____________________________________________________________________  External Attachment:    Type:   Image     Comment:   External Document

## 2010-12-28 NOTE — Letter (Signed)
Summary: [facility]  [facility]   Imported By: Sherian Rein 02/17/2010 09:11:06  _____________________________________________________________________  External Attachment:    Type:   Image     Comment:   External Document

## 2010-12-28 NOTE — Medication Information (Signed)
Summary: rov/tm   Anticoagulant Therapy  Managed by: [name], PharmD Referring MD: Sherryl Manges MD PCP: Link Snuffer MD: Myrtis Ser MD, Tinnie Gens Indication 1: Atrial Fibrillation (ICD-427.31) Lab Used: [facility] Site: [facility] INR POC 2.2 INR RANGE 2 - 3  Dietary changes: yes       Details: Eating less 2/2 no appetite  Health status changes: no    Bleeding/hemorrhagic complications: no    Recent/future hospitalizations: no    Any changes in medication regimen? no    Recent/future dental: no  Any missed doses?: no       Is patient compliant with meds? yes       Allergies: 1)  ! Penicillin 2)  ! * Clindamycin 3)  ! Percocet  Anticoagulation Management History:      The patient is taking warfarin and comes in today for a routine follow up visit.  Positive risk factors for bleeding include an age of 75 years or older, history of GI bleeding, and presence of serious comorbidities.  The bleeding index is 'high risk'.  Positive CHADS2 values include Age > 75 years old and History of Diabetes.  The start date was 04/23/2007.  His last INR was 1.7 RATIO.  Anticoagulation responsible provider: Myrtis Ser MD, Tinnie Gens.  INR POC: 2.2.  Exp: 09/2011.    Anticoagulation Management Assessment/Plan:      The patient's current anticoagulation dose is Coumadin 5 mg  tabs: Take as directed by coumadin clinic..  The target INR is 2 - 3.  The next INR is due 11/10/2010.  Anticoagulation instructions were given to patient.  Results were reviewed/authorized by [name], PharmD.  He was notified by Hoy Register, PharmD Candidate.         Prior Anticoagulation Instructions: INR 2.7 Continue 7.5mg s daily except 5mg s on Sundays. Recheck in 4 weeks.   Current Anticoagulation Instructions: INR 2.2 Continue pervious dose of 1.5 tablets everyday except 1 tablet on Sunday Recheck INR in 4 weeks

## 2010-12-28 NOTE — Medication Information (Signed)
Summary: rov/kb  Anticoagulant Therapy  Managed by: Bethena Midget, RN, BSN Referring MD: Sherryl Manges MD PCP: Link Snuffer MD: Shirlee Latch MD, Mycah Formica Indication 1: Atrial Fibrillation (ICD-427.31) Lab Used: [facility] Site: [facility] INR POC 1.5 INR RANGE 2 - 3  Dietary changes: no    Health status changes: no    Bleeding/hemorrhagic complications: no    Recent/future hospitalizations: no    Any changes in medication regimen? no    Recent/future dental: no  Any missed doses?: no       Is patient compliant with meds? yes       Allergies: 1)  ! Penicillin 2)  ! * Clindamycin 3)  ! Percocet  Anticoagulation Management History:      The patient is taking warfarin and comes in today for a routine follow up visit.  Positive risk factors for bleeding include an age of 32 years or older, history of GI bleeding, and presence of serious comorbidities.  The bleeding index is 'high risk'.  Positive CHADS2 values include Age > 32 years old and History of Diabetes.  The start date was 04/23/2007.  His last INR was 1.7 RATIO.  Anticoagulation responsible provider: Shirlee Latch MD, Samul Mcinroy.  INR POC: 1.5.  Cuvette Lot#: [id number].  Exp: 07/2011.    Anticoagulation Management Assessment/Plan:      The patient's current anticoagulation dose is Coumadin 5 mg  tabs: Take as directed by coumadin clinic..  The target INR is 2 - 3.  The next INR is due 06/02/2010.  Anticoagulation instructions were given to patient.  Results were reviewed/authorized by Bethena Midget, RN, BSN.  He was notified by Bethena Midget, RN, BSN.         Prior Anticoagulation Instructions: INR-3.1 take 1 tablet today and  resume normal dosing schedule, Take 1 tablet on Sunday and take 1.5 tablets on all other days of the week.  Return in  4 weeks   Current Anticoagulation Instructions: INR 1.5 Today take 10mg s and Tuesday, then resume 7.5mg s everyday except 5mg s on Sundays. Recheck in 2 weeks.

## 2010-12-30 NOTE — Medication Information (Signed)
Summary: ROV/SP   Anticoagulant Therapy  Managed by: [name], PharmD Referring MD: Sherryl Manges MD PCP: Link Snuffer MD: Graciela Husbands MD, Viviann Spare Indication 1: Atrial Fibrillation (ICD-427.31) Lab Used: [facility] Site: [facility] INR POC 2.6 INR RANGE 2 - 3  Dietary changes: no    Health status changes: no           Allergies: 1)  ! Penicillin 2)  ! * Clindamycin 3)  ! Percocet  Anticoagulation Management History:      The patient is taking warfarin and comes in today for a routine follow up visit.  Positive risk factors for bleeding include an age of 75 years or older, history of GI bleeding, and presence of serious comorbidities.  The bleeding index is 'high risk'.  Positive CHADS2 values include Age > 43 years old and History of Diabetes.  The start date was 04/23/2007.  His last INR was 1.7 RATIO.  Anticoagulation responsible provider: Graciela Husbands MD, Viviann Spare.  INR POC: 2.6.  Cuvette Lot#: [id number].  Exp: 12/2011.    Anticoagulation Management Assessment/Plan:      The patient's current anticoagulation dose is Coumadin 5 mg  tabs: Take as directed by coumadin clinic..  The target INR is 2 - 3.  The next INR is due 01/12/2011.  Anticoagulation instructions were given to patient.  Results were reviewed/authorized by [name], PharmD.  He was notified by Linward Headland, PharmD candidate.         Prior Anticoagulation Instructions: INR 1.7 Today take 2 tablets. Then take 1.5 tablets every day.   Current Anticoagulation Instructions: INR 2.6 (goal INR: 2-3)  Take 1 and 1/2 tablets everyday.  Recheck in 4 weeks.

## 2010-12-30 NOTE — Medication Information (Signed)
Summary: rov/kh   Anticoagulant Therapy  Managed by: Tammy Sours PharmD Referring MD: Sherryl Manges MD PCP: Link Snuffer MD: Shirlee Latch MD, Dalton Indication 1: Atrial Fibrillation (ICD-427.31) Lab Used: [facility] Site: [facility] INR POC 1.5 INR RANGE 2 - 3  Dietary changes: no    Health status changes: no    Bleeding/hemorrhagic complications: no    Recent/future hospitalizations: no    Any changes in medication regimen? no    Recent/future dental: no  Any missed doses?: no       Is patient compliant with meds? yes       Allergies: 1)  ! Penicillin 2)  ! * Clindamycin 3)  ! Percocet  Anticoagulation Management History:      Positive risk factors for bleeding include an age of 29 years or older, history of GI bleeding, and presence of serious comorbidities.  The bleeding index is 'high risk'.  Positive CHADS2 values include Age > 20 years old and History of Diabetes.  The start date was 04/23/2007.  His last INR was 1.7 RATIO.  Anticoagulation responsible provider: Shirlee Latch MD, Dalton.  INR POC: 1.5.  Cuvette Lot#: [id number].  Exp: 09/2011.    Anticoagulation Management Assessment/Plan:      The patient's current anticoagulation dose is Coumadin 5 mg  tabs: Take as directed by coumadin clinic..  The target INR is 2 - 3.  The next INR is due 12/01/2010.  Anticoagulation instructions were given to patient.  Results were reviewed/authorized by Tammy Sours PharmD.         Prior Anticoagulation Instructions: INR 2.2 Continue pervious dose of 1.5 tablets everyday except 1 tablet on Sunday Recheck INR in 4 weeks  Current Anticoagulation Instructions: INR 1.5  Take 2 tablets today and tomorrow, then resume taking 1.5 tablets daily except 1 tablet on Sunday.  Recheck INR in 3 weeks.

## 2010-12-30 NOTE — Medication Information (Signed)
Summary: rov/kh   Anticoagulant Therapy  Managed by: [name], PharmD Referring MD: Sherryl Manges MD PCP: Link Snuffer MD: Antoine Poche MD, Fayrene Fearing Indication 1: Atrial Fibrillation (ICD-427.31) Lab Used: [facility] Site: [facility] INR POC 1.7 INR RANGE 2 - 3  Dietary changes: no    Health status changes: no    Bleeding/hemorrhagic complications: no    Recent/future hospitalizations: no    Any changes in medication regimen? no    Recent/future dental: no  Any missed doses?: no       Is patient compliant with meds? yes       Allergies: 1)  ! Penicillin 2)  ! * Clindamycin 3)  ! Percocet  Anticoagulation Management History:      The patient is taking warfarin and comes in today for a routine follow up visit.  Positive risk factors for bleeding include an age of 72 years or older, history of GI bleeding, and presence of serious comorbidities.  The bleeding index is 'high risk'.  Positive CHADS2 values include Age > 15 years old and History of Diabetes.  The start date was 04/23/2007.  His last INR was 1.7 RATIO.  Anticoagulation responsible provider: Antoine Poche MD, Fayrene Fearing.  INR POC: 1.7.  Cuvette Lot#: [id number].  Exp: 12/2011.    Anticoagulation Management Assessment/Plan:      The patient's current anticoagulation dose is Coumadin 5 mg  tabs: Take as directed by coumadin clinic..  The target INR is 2 - 3.  The next INR is due 12/15/2010.  Anticoagulation instructions were given to patient.  Results were reviewed/authorized by [name], PharmD.  He was notified by Stephannie Peters, PharmD Candidate.         Prior Anticoagulation Instructions: INR 1.5  Take 2 tablets today and tomorrow, then resume taking 1.5 tablets daily except 1 tablet on Sunday.  Recheck INR in 3 weeks.    Current Anticoagulation Instructions: INR 1.7 Today take 2 tablets. Then take 1.5 tablets every day.

## 2010-12-31 NOTE — Letter (Signed)
Summary: [facility] Office Note  [facility] Office Note   Imported By: Roderic Ovens 03/31/2010 15:18:09  _____________________________________________________________________  External Attachment:    Type:   Image     Comment:   External Document

## 2011-01-11 DIAGNOSIS — I4891 Unspecified atrial fibrillation: Secondary | ICD-10-CM

## 2011-01-12 ENCOUNTER — Encounter (INDEPENDENT_AMBULATORY_CARE_PROVIDER_SITE_OTHER): Payer: Medicare Other

## 2011-01-12 ENCOUNTER — Encounter: Payer: Self-pay | Admitting: Internal Medicine

## 2011-01-12 DIAGNOSIS — Z7901 Long term (current) use of anticoagulants: Secondary | ICD-10-CM

## 2011-01-12 DIAGNOSIS — I4891 Unspecified atrial fibrillation: Secondary | ICD-10-CM

## 2011-01-17 ENCOUNTER — Telehealth: Payer: Self-pay | Admitting: Internal Medicine

## 2011-01-19 NOTE — Medication Information (Signed)
Summary: [facility]   Anticoagulant Therapy  Managed by: [name], PharmD Referring MD: Sherryl Manges MD PCP: Link Snuffer MD: Gala Romney MD, Reuel Boom Indication 1: Atrial Fibrillation (ICD-427.31) Lab Used: [facility] Site: [facility] INR POC 2.7 INR RANGE 2 - 3  Dietary changes: no    Health status changes: no    Bleeding/hemorrhagic complications: no    Recent/future hospitalizations: no    Any changes in medication regimen? no    Recent/future dental: no  Any missed doses?: no       Is patient compliant with meds? yes       Allergies: 1)  ! Penicillin 2)  ! * Clindamycin 3)  ! Percocet  Anticoagulation Management History:      The patient is taking warfarin and comes in today for a routine follow up visit.  Positive risk factors for bleeding include an age of 75 years or older, history of GI bleeding, and presence of serious comorbidities.  The bleeding index is 'high risk'.  Positive CHADS2 values include Age > 29 years old and History of Diabetes.  The start date was 04/23/2007.  His last INR was 1.7 RATIO.  Anticoagulation responsible provider: Jewels Langone MD, Reuel Boom.  INR POC: 2.7.  Cuvette Lot#: [id number].  Exp: 11/2011.    Anticoagulation Management Assessment/Plan:      The patient's current anticoagulation dose is Coumadin 5 mg  tabs: Take as directed by coumadin clinic..  The target INR is 2 - 3.  The next INR is due 02/09/2011.  Anticoagulation instructions were given to patient.  Results were reviewed/authorized by [name], PharmD.  He was notified by [name] PharmD.         Prior Anticoagulation Instructions: INR 2.6 (goal INR: 2-3)  Take 1 and 1/2 tablets everyday.  Recheck in 4 weeks.  Current Anticoagulation Instructions: INR 2.7  Continue same dose of 1 1/2 tablets every day.  Recheck INR in 4 weeks.

## 2011-01-25 NOTE — Progress Notes (Signed)
Summary: RF   Phone Note Refill Request   Refills Requested: Medication #1:  FLOMAX 0.4 MG  CP24 Take 1 tablet by mouth two times a day  Medication #2:  HUMULIN 70/30 70-30 % SUSP 12 units am and 17 units in the pm  Medication #3:  CREON 12000 UNIT CPEP 5 capsules per day  Medication #4:  BD INSULIN SYRINGE U-100 1 ML MISC for use with insulin [facility] - [facility] FOR YEAR SUPPLY?   Initial call taken by: Lamar Sprinkles, CMA,  January 17, 2011 11:52 AM  Follow-up for Phone Call        ok for 90 day refills x 3 Follow-up by: Jacques Navy MD,  January 17, 2011 1:02 PM    Prescriptions: BD INSULIN SYRINGE U-100 1 ML MISC (INSULIN SYRINGES (DISPOSABLE)) for use with insulin  #200 x 3   Entered by:   Lamar Sprinkles, CMA   Authorized by:   Jacques Navy MD   Signed by:   Lamar Sprinkles, CMA on 01/17/2011   Method used:   Electronically to        [facility]* (mail-order)       [address]       [place], [place]  [address]       Ph: [phone]       Fax: [phone]   RxID:   [id number] HUMULIN 70/30 70-30 % SUSP (INSULIN ISOPHANE & REGULAR) 12 units am and 17 units in the pm  #3 mth x 3   Entered by:   Lamar Sprinkles, CMA   Authorized by:   Jacques Navy MD   Signed by:   Lamar Sprinkles, CMA on 01/17/2011   Method used:   Electronically to        [facility]* (mail-order)       [address]       [place], [place]  [address]       Ph: [phone]       Fax: [phone]   RxID:   [id number] CREON 12000 UNIT CPEP (PANCRELIPASE (LIP-PROT-AMYL)) 5 capsules per day  #450 x 3   Entered by:   Lamar Sprinkles, CMA   Authorized by:   Jacques Navy MD   Signed by:   Lamar Sprinkles, CMA on 01/17/2011   Method used:   Electronically to        [facility]* (mail-order)       [address]       [place], [place]  [address]       Ph: [phone]       Fax: [phone]   RxID:    [id number] FLOMAX 0.4 MG  CP24 (TAMSULOSIN HCL) Take 1 tablet by mouth two times a day  #180 x 5   Entered by:   Lamar Sprinkles, CMA   Authorized by:   Jacques Navy MD   Signed by:   Lamar Sprinkles, CMA on 01/17/2011   Method used:   Electronically to        [facility]* (mail-order)       [address]       [place], [place]  [address]       Ph: [phone]       Fax: [phone]   RxID:   [id number]

## 2011-02-02 ENCOUNTER — Ambulatory Visit: Payer: Self-pay | Admitting: Internal Medicine

## 2011-02-09 ENCOUNTER — Encounter (INDEPENDENT_AMBULATORY_CARE_PROVIDER_SITE_OTHER): Payer: No Typology Code available for payment source

## 2011-02-09 ENCOUNTER — Encounter: Payer: Self-pay | Admitting: Cardiology

## 2011-02-09 DIAGNOSIS — I4891 Unspecified atrial fibrillation: Secondary | ICD-10-CM

## 2011-02-09 DIAGNOSIS — Z7901 Long term (current) use of anticoagulants: Secondary | ICD-10-CM

## 2011-02-09 LAB — CONVERTED CEMR LAB: POC INR: 2.2

## 2011-02-15 NOTE — Medication Information (Signed)
Summary: rov/sp  Anticoagulant Therapy  Managed by: Samantha Crimes, PharmD Referring MD: Sherryl Manges MD PCP: Link Snuffer MD: Gala Romney MD, Reuel Boom Indication 1: Atrial Fibrillation (ICD-427.31) Lab Used: [facility] Site: [facility] INR POC 2.2 INR RANGE 2 - 3  Dietary changes: no    Health status changes: no    Bleeding/hemorrhagic complications: no    Recent/future hospitalizations: no    Any changes in medication regimen? no    Recent/future dental: no  Any missed doses?: no       Is patient compliant with meds? yes       Current Medications (verified): 1)  Flomax 0.4 Mg  Cp24 (Tamsulosin Hcl) .... Take 1 Tablet By Mouth Two Times A Day 2)  Creon 12000 Unit Cpep (Pancrelipase (Lip-Prot-Amyl)) .... 5 Capsules Per Day 3)  Humulin 70/30 70-30 % Susp (Insulin Isophane & Regular) .Marland Kitchen.. 12 Units Am and 17 Units in The Pm 4)  Centrum Silver   Tabs (Multiple Vitamins-Minerals) .... Take One Tablet Once Daily 5)  Vitamin C 500 Mg  Tabs (Ascorbic Acid) .... Take One Tablet Once Daily 6)  Amlactin Xl   Lotn (Emollient) .... Add Lotion To Legs Once Daily 7)  Glucosamine-Chondroitin   Tabs (Glucosamine-Chondroit-Vit C-Mn) .... Take One Tablet Daily 8)  Tikosyn 500 Mcg Caps (Dofetilide) .Marland Kitchen.. 1 Tablet Every 12 Hours 9)  Carvedilol 12.5 Mg  Tabs (Carvedilol) .... Take 1/2 Tablet By Mouth Two Times A Day 10)  Lisinopril 10 Mg  Tabs (Lisinopril) .... Take One Tablet Once Daily 11)  Hypotears 1-1 %  Soln (Polyethyl Glycol-Polyvinyl Alc) .... Use As Needed 12)  Coumadin 5 Mg  Tabs (Warfarin Sodium) .... Take As Directed By Coumadin Clinic. 13)  Bd Insulin Syringe U-100 1 Ml Misc (Insulin Syringes (Disposable)) .... For Use With Insulin 14)  Triamcinolone Acetonide 0.1 % Crea (Triamcinolone Acetonide) .... Apply To Left Leg Two Times A Day  Allergies (verified): 1)  ! Penicillin 2)  ! * Clindamycin 3)  ! Percocet  Anticoagulation Management History:      Positive risk  factors for bleeding include an age of 61 years or older, history of GI bleeding, and presence of serious comorbidities.  The bleeding index is 'high risk'.  Positive CHADS2 values include Age > 37 years old and History of Diabetes.  The start date was 04/23/2007.  His last INR was 1.7 RATIO.  Anticoagulation responsible provider: Bensimhon MD, Reuel Boom.  INR POC: 2.2.  Exp: 11/2011.    Anticoagulation Management Assessment/Plan:      The patient's current anticoagulation dose is Coumadin 5 mg  tabs: Take as directed by coumadin clinic..  The target INR is 2 - 3.  The next INR is due 03/09/2011.  Anticoagulation instructions were given to patient.  Results were reviewed/authorized by Samantha Crimes, PharmD.         Prior Anticoagulation Instructions: INR 2.7  Continue same dose of 1 1/2 tablets every day.  Recheck INR in 4 weeks.   Current Anticoagulation Instructions: Cont with current regimen Return to clinic in 4 weeks

## 2011-03-09 ENCOUNTER — Ambulatory Visit (INDEPENDENT_AMBULATORY_CARE_PROVIDER_SITE_OTHER): Payer: Medicare Other | Admitting: *Deleted

## 2011-03-09 ENCOUNTER — Encounter: Payer: Self-pay | Admitting: Internal Medicine

## 2011-03-09 DIAGNOSIS — I4891 Unspecified atrial fibrillation: Secondary | ICD-10-CM

## 2011-03-09 DIAGNOSIS — Z7901 Long term (current) use of anticoagulants: Secondary | ICD-10-CM

## 2011-03-14 ENCOUNTER — Encounter: Payer: Self-pay | Admitting: Internal Medicine

## 2011-03-14 ENCOUNTER — Ambulatory Visit (INDEPENDENT_AMBULATORY_CARE_PROVIDER_SITE_OTHER): Payer: Medicare Other | Admitting: Internal Medicine

## 2011-03-14 DIAGNOSIS — Z79899 Other long term (current) drug therapy: Secondary | ICD-10-CM

## 2011-03-14 DIAGNOSIS — I4891 Unspecified atrial fibrillation: Secondary | ICD-10-CM

## 2011-03-14 LAB — BASIC METABOLIC PANEL
CO2: 29 mEq/L (ref 19–32)
Calcium: 9 mg/dL (ref 8.4–10.5)
Creatinine, Ser: 1 mg/dL (ref 0.4–1.5)
GFR: 77.48 mL/min (ref >60.00)
Sodium: 137 mEq/L (ref 135–145)

## 2011-03-14 NOTE — Assessment & Plan Note (Signed)
Holding sinus rhythm on Tikosyn. We will plan to check his potassium and magnesium level. His ECG is acceptable.

## 2011-03-14 NOTE — Patient Instructions (Signed)
Your physician recommends that you schedule a follow-up appointment in: 6 MONTHS WITH DT Graciela Husbands Your physician recommends that you return for lab work [id number] BMET MAG DX V58.69 Your physician recommends that you continue on your current medications as directed. Please refer to the Current Medication list given to you today.

## 2011-03-14 NOTE — Progress Notes (Signed)
HPI  [name] is a 77 y.o. male seen in followup for paroxysmal atrial fibrillation for which he takes Tikosyn. he takes Coumadin for thomboembolic   risk reduction given his history of hypertension diabetes and age.  He continues to do amazingly well, notwithstanding the constellation of his past medical history The patient denies SOB, chest pain, edema or palpitations He traeted his chronic headaches with saline flushes and no more cheese and they have resolved Past Medical History  Diagnosis Date  . Hematuria     history of  . Heart disease     history of  . Acid reflux disease     history of  . H/O: GI bleed   . 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 Paroxysmal atrial fibrillation   . Benign prostatic hypertrophy     history of  . IDDM (insulin dependent diabetes mellitus)   . Hepatic damage march 2009    retained hepatic stone , intrahepatic duct catheter    Past Surgical History  Procedure Date  . Cardioversion   . Cholecystectomy     history of  . Partial gastrectomy     history of  . Shoulder surgery     left shoulder repair with 2 pens inserted  . Whipple procedure     operation  . 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 Inguinal hernia repair     inguinal herniorrhaphy, right... history of  . Appendectomy     history of    Current Outpatient Prescriptions  Medication Sig Dispense Refill  . Ascorbic Acid (VITAMIN C) 500 MG tablet Take 500 mg by mouth daily.        . carvedilol (COREG) 12.5 MG tablet Take 6.25 mg by mouth 2 (two) times daily with a meal.        . Emollient (AMLACTIN XL) LOTN Apply lotion to legs once daily       . HUMULIN 70/30 70-30 % injection 10 units in the and 15 units in the pm      . Insulin Syringes, Disposable, (B-D INSULIN SYRINGE 1CC) U-100 1 ML MISC For use with insulin       . lisinopril (PRINIVIL,ZESTRIL) 10 MG tablet Take 10 mg by mouth. Take one tablet by mouth daily       . Multiple Vitamins-Minerals (CENTRUM SILVER PO) Take by mouth daily.        . Pancrelipase, Lip-Prot-Amyl, (CREON) 12000 UNITS CPEP 1 capsule 5 (five) times daily.        . Tamsulosin HCl (FLOMAX) 0.4 MG CAPS Take 0.4 mg by mouth 2 (two) times daily.        Marland Kitchen TIKOSYN 500 MCG capsule Take 500 mcg by mouth. Take 1 tablet every 12 hours      . triamcinolone (KENALOG) 0.1 % cream Apply to left leg two times daily       . warfarin (COUMADIN) 5 MG tablet Take by mouth as directed.          Allergies  Allergen Reactions  . Clindamycin   . Oxycodone-Acetaminophen   . Penicillins     REACTION: causes hives    Review of Systems negative except from HPI and PMH  Physical Exam Well developed and well nourished in no acute distress HENT normal E scleral and icterus clear Neck Supple JVP flat; carotids brisk and full Clear to ausculation Regular rate and rhythm, no murmurs gallops or rub Soft with  active bowel sounds No clubbing cyanosis and edema Alert and oriented, grossly normal motor and sensory function Skin Warm and Dry  ECG nsr at 59 .21/.08/.44 O/w normal Assessment and  Plan

## 2011-03-30 ENCOUNTER — Ambulatory Visit (INDEPENDENT_AMBULATORY_CARE_PROVIDER_SITE_OTHER): Payer: Medicare Other | Admitting: *Deleted

## 2011-03-30 DIAGNOSIS — I4891 Unspecified atrial fibrillation: Secondary | ICD-10-CM

## 2011-03-30 LAB — POCT INR: INR: 3.2

## 2011-04-06 ENCOUNTER — Encounter: Payer: No Typology Code available for payment source | Admitting: Internal Medicine

## 2011-04-12 NOTE — Assessment & Plan Note (Signed)
[facility]                         ELECTROPHYSIOLOGY OFFICE NOTE   NAME:FISHERSanjith, Siwek                         MRN:          [id number]  DATE:10/31/2007                            DOB:          02-Feb-1925    Mr. Stabenow comes in for his atrial fibrillation.  He is on Tikosyn and  he is doing very well.  He had blood work drawn by Dr. Debby Bud a couple  of weeks ago.  He was told that it was fine.  I have contacted Dr.  Debby Bud to make sure the potassium and magnesium were within range for  his Tikosyn therapy.   PHYSICAL EXAMINATION:  VITAL SIGNS:  Blood pressure 118/57, pulse 47.  LUNGS:  Clear.  HEART:  Regular.  EXTREMITIES:  Without edema.   MEDICATIONS:  1. Carvedilol 12.5 b.i.d. up titrated from 6.  2. Lisinopril 10.  3. Tikosyn 500.  4. Coumadin.   IMPRESSION:  1. Paroxysmal atrial fibrillation.  2. Thromboembolic risk factors notable for age, hypertension and      diabetes as well as tachycardia that is thought to be tachycardia      mediated.   PLAN:  Mr. Therien is doing very well.  We will plan to see him again in  3 months' time.     Duke Salvia, MD, [facility]  Electronically Signed    SCK/MedQ  DD: 10/31/2007  DT: 11/01/2007  Job #: [id number]

## 2011-04-12 NOTE — Op Note (Signed)
NAME:  [name], [name] NO.:  [id number]   MEDICAL RECORD NO.:  [id number]          PATIENT TYPE:  AMB   LOCATION:  SDS                          FACILITY:  [facility]   PHYSICIAN:  Adolph Pollack, M.D.DATE OF BIRTH:  [birth year]   DATE OF PROCEDURE:  08/26/2008  DATE OF DISCHARGE:                               OPERATIVE REPORT   PREOPERATIVE DIAGNOSIS:  Recurrent right inguinal hernia.   POSTOPERATIVE DIAGNOSIS:  Recurrent right inguinal hernia.   PROCEDURE:  Repair of recurrent right inguinal hernia with mesh.   SURGEON:  Adolph Pollack, MD   ANESTHESIA:  General plus 0.5% Marcaine local.   INDICATIONS:  This is an 77 year old male who had a repair of right  inguinal hernia 10 years ago in [place], [place].  He has noted an  uncomfortable bulge inferior to this incision and on exam has a  recurrent hernia.  He now presents for a repair.  Procedure risks and  aftercare were explained to him preoperatively.   TECHNIQUE:  He was seen in the holding area and his right groin was  marked with my initials.  He was then brought to the operating room,  placed supine on the operating table, and given a general anesthetic.  The hair on the right groin was clipped and the old scar was marked with  a marking pen.  The area was then sterilely prepped and draped.  Marcaine solution was infiltrated superficially and then deep in the  area of the old scar.  I reincised the old scar sharply and carried this  down through the subcutaneous scar to the external oblique aponeurosis.  I infiltrated local anesthetic deep to this.  Following sutures, I  reopened external oblique aponeurosis.  I did this very carefully as his  spermatic cord was stuck to it.  Upon opening of this, I noted indirect  hernia defect with a sac protruding through an enlarged internal ring.  I used sharp dissection with electrocautery to separate the external  oblique aponeurosis from the spermatic cord  freeing it up and mobilizing  it.  I then dissected the sac free from the cord and surrounding scar  tissue.  I retracted the cord anteriorly and used posterior dissection  and exposed the pubic tubercle.  I noted the ilioinguinal nerve was  fairly adherent to the scar inferiorly.  I left this where it was for  the time being.  I then exposed as much of the shelving edge of the  inguinal ligament as possible and also exposed Cooper ligament and the  shelving edge was fairly attenuated medially.  Using blunt dissection  electrocautery, I exposed the internal oblique aponeurosis and muscle  superiorly.   I reduced the hernia contents back to the patulous internal ring.  I  then brought a piece of 3 x 6 inch polypropylene mesh into field and  anchored it 2 cm medial to the pubic tubercle with 2-0 Prolene suture.  I then anchored the inferior aspect of the mesh initially to [place]  ligament and the shelving edge of the inguinal ligament with a  transition stitch.  I then continued to run the suture anchoring it to  the shelving edge of the inguinal ligament to a level 2 cm lateral to  the internal ring.  I then cut a slit in the mesh for up to tail to  surround the cord.  The superior aspect of mesh was then anchored to the  internal oblique aponeurosis and muscle with interrupted 2-0 Vicryl  sutures.  I noticed that the ilioinguinal nerve would be caught in some  of the stitches to the mesh, so I went ahead and performed a neurectomy  ligating it as lateral as possible and then dividing it.   Following this, I crossed 2 tails creating an tight internal ring and  anchored them to the shelving edge of the inguinal ligament with 2-0  Prolene suture.  I then inspected the area and hemostasis was adequate.   I closed the external oblique aponeurosis over the mesh and cord with a  running 3-0 Vicryl suture.  The subcutaneous tissue and the scar was  closed with a running 2-0 Vicryl suture.  The  skin was closed with a 4-0  Monocryl subcuticular stitch followed by Steri-Strips and sterile  dressings.  The right testicle was then in its normal position in the  scrotum.   He tolerated the procedure well without any apparent complications and  was taken to recovery room in satisfactory condition.      Adolph Pollack, M.D.  Electronically Signed     TJR/MEDQ  D:  08/26/2008  T:  08/26/2008  Job:  [id number]   cc:   Rosalyn Gess. Norins, MD

## 2011-04-12 NOTE — H&P (Signed)
NAMEMarland Kitchen  [name], Casey NO.:  [id number]   MEDICAL RECORD NO.:  [id number]          PATIENT TYPE:  INP   LOCATION:  2031                         FACILITY:  [facility]   PHYSICIAN:  Duke Salvia, MD, FACCDATE OF BIRTH:  04-10-1925   DATE OF ADMISSION:  05/31/2007  DATE OF DISCHARGE:                              HISTORY & PHYSICAL   ELECTROPHYSIOLOGIST:  Dr. Sherryl Manges.   PRIMARY CARE-GIVER:  Dr. Debby Bud.   PRESENTING CIRCUMSTANCE:  I'm here for medication for my atrial  fibrillation.   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  He developed both diabetes and atrial fibrillation  postoperatively.  At that time, he responded with DCCV and Betapace.  He  had tolerated Betapace for about 5-6 years.   Since his Whipple, he has had 2 other lapses of his rhythm into atrial  fibrillation.  He can tell when he is in atrial fibrillation because of  palpitation, dyspnea and decreased exercise tolerance.  He does not have  chest pain, dizziness or syncope.  Both lapses previously have responded  to IV therapy in the emergency room.   Currently, he has been out of rhythm.  He had a negative transesophageal  echocardiogram and then cardioversion, May 24, 2007.  He stayed in  sinus rhythm about 5 days.  He woke up one morning and knew that he had  been back in atrial fibrillation.  He now presents for Tikosyn therapy.  A recent echocardiogram was also done May 24, 2007; ejection fraction  was 25% to 30% with moderate mitral regurgitation.  There is concern he  may have a tachycardia-mediated cardiomyopathy.  He has no history of  myocardial infarction, thus we have re-double efforts to maintain sinus  rhythm.  If the patient finds that Tikosyn therapy does not work, then a  trial of amiodarone is planned.   ALLERGIES:  To PENICILLIN and CLINDAMYCIN.   MEDICATIONS:  1. Coumadin 7.5 mg daily, Tuesday, Thursday, Saturday and Sunday;      Coumadin 5 mg Monday, Wednesday and Friday.  2. Flomax 0.4 mg daily.  3. Creon 10 two to three caps per meal.  4. Humulin 70/30 fourteen units the morning, 15 in the evening.  5. Centrum Silver daily.  6. Vitamin C 500 mg daily.  Seven  7. Digoxin 0.125 mg daily.  8. Carvedilol 3.125 mg twice daily.  9. Lisinopril 10 mg daily.   PAST MEDICAL HISTORY:  1. Bile duct cancer.  2. Postoperative atrial fibrillation.  3. Diabetes.  4. Chronic headaches.   PAST SURGICAL HISTORY:  1. Whipple procedure 9 years ago.  2. Status post appendectomy.  3. Status post stomach surgery for recurrent obstruction.   SOCIAL HISTORY:  The patient lives in [place] with his wife.  He does  not smoke, does not partake of alcoholic beverages, does not do drugs.  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   FAMILY HISTORY:  His mother died at age  74 in her sleep  Father died at  age 68 of a myocardial infarction.  He has 3 sisters and 1 brother who  are living.  One sister died at age 80 of old age.  One brother died of  cancer.   REVIEW OF SYSTEMS:  The patient is not having chest pain.  He is mildly  short of breath and has decreased exercise tolerance, now in atrial  fibrillation.  He does feel palpitations, especially at rest.  He does  not have orthopnea or paroxysmal nocturnal dyspnea and he does not have  lower extremity edema.  He has no history of syncope or presyncope.  He  has no rashes or nonhealing ulcerations.  He has no GERD, no history of  epistaxis, hematemesis or hematochezia.  He does not have incontinence  or urgency.  He has no arthralgias, effusions, joint pain.  No history  of seizure or myocardial infarction, or cerebrovascular accident.   PHYSICAL EXAMINATION:  GENERAL:  In no acute distress, alert and  oriented x3.  VITAL SIGNS:.  Blood pressure is 107/69, heart rate is 73, respirations  are  20, oxygen saturation 99% on room air, temperature 96.8.  HEENT:  Normocephalic, atraumatic.  Eyes:  Pupils equal, round and  reactive to light.  Extraocular movements are intact.  Conjunctivae  anicteric.  Sclerae are clear.  Nares are patent.  Oropharynx shows no  lesions or erythema.  NECK:  Supple.  No carotid bruits auscultated.  No cervical  lymphadenopathy.  LUNGS:  Clear to auscultation bilaterally.  HEART:  Irregular rate and rhythm.  Telemetry shows atrial fibrillation  with a rate of 103.  ABDOMEN:  Soft and nondistended.  Bowel sounds are present.  EXTREMITIES:  No clubbing, cyanosis or edema.  NEUROLOGIC:  Grossly intact.   IMPRESSION:  1. Recurrent atrial fibrillation.  2. 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  3. History of bile duct cancer, status post Whipple procedure.   PLAN:  Tikosyn therapy.  Cardioversion, Saturday, July 5; home Sunday,  July 6.   LABORATORY DATA:  Serum electrolytes this admission:  Sodium 135,  potassium 4.7, chloride 102, bicarbonate 30, BUN is 18, creatinine 1.02  and glucose of 120.  Pro time is 25.7, INR 2.2.      [name], [place]      Duke Salvia, MD, [facility]  Electronically Signed    GM/MEDQ  D:  05/31/2007  T:  06/01/2007  Job:  [id number]

## 2011-04-12 NOTE — Discharge Summary (Signed)
NAME:  [name], [name] NO.:  [id number]   MEDICAL RECORD NO.:  [id number]          PATIENT TYPE:  INP   LOCATION:  2031                         FACILITY:  [facility]   PHYSICIAN:  Duke Salvia, MD, FACCDATE OF BIRTH:  09-18-1925   DATE OF ADMISSION:  05/31/2007  DATE OF DISCHARGE:  06/03/2007                               DISCHARGE SUMMARY   ADDENDUM:   CORRECTION TO THE DISCHARGING MEDICATIONS:  We attempted to titrate the  patient carvedilol up to 6.25 mg p.o. b.i.d. during this admission;  however, on day of discharge, systolic running in the mid 80s, patient  very concerned about his blood pressure being too low once he goes home;  note, he has 3.125-mg tablets at home.  I have asking to take 1 tablet  in the morning and 2 tablets at bedtime and to continue this until he  follows up with Loura Pardon in 2-3 weeks.  Hopefully, we will be able to  titrate his morning dose up to 6.25 mg also at that time.      Dorian Pod, ACNP      Duke Salvia, MD, [facility]  Electronically Signed    MB/MEDQ  D:  06/03/2007  T:  06/04/2007  Job:  [id number]   cc:   Rosalyn Gess. Norins, MD

## 2011-04-12 NOTE — Assessment & Plan Note (Signed)
[facility]                         ELECTROPHYSIOLOGY OFFICE NOTE   NAME:[name], [name]                         MRN:          [id number]  DATE:07/18/2007                            DOB:          October 07, 1925    [name] was seen while in recent hospitalization and initiation of  Tikosyn for atrial arrhythmias that were associated with a rapid  ventricular response and cardiomyopathy.  Repeat echocardiogram last  week demonstrates ejection fraction has nearly normalized and is now at  45-50%.   CURRENT MEDICATIONS:  1. Carvedilol at 6.25.  2. Digoxin, we are going to stop.  3. Tikosyn 500 mcg b.i.d.  4. Lisinopril 10.   PHYSICAL EXAMINATION:  His blood pressure was pretty good at 118/60.  His pulse was 64.  LUNGS:  Clear.  Heart sounds were regular.  EXTREMITIES:  Without edema.   IMPRESSION:  1. Paroxysmal atrial fibrillation with recent episode a week or so ago      that lasted a few hours.  2. Tikosyn for #1.  3. 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   We will plan to see him again in 4 months' time.  He is to see Dr.  Debby Bud next month and, at that time, we will ask him to get a potassium  and magnesium to make sure those look okay.     Duke Salvia, MD, [facility]  Electronically Signed    SCK/MedQ  DD: 07/18/2007  DT: 07/19/2007  Job #: [id number]

## 2011-04-12 NOTE — Assessment & Plan Note (Signed)
[facility]                         ELECTROPHYSIOLOGY OFFICE NOTE   NAME:FISHERIreland, Chagnon                         MRN:          [id number]  DATE:05/30/2007                            DOB:          [birth year]    Mr. Dible called in today. He was cardioverted last week for  cardiomyopathy in the context of rapid atrial fibrillation. He held  sinus rhythm for about five days and noticed that he is out of rhythm  today.   Electrocardiogram confirmed that.   We discussed treatments options. These include use of antiarrhythmic  drug therapy for the maintenance of sinus rhythm and rate control versus  AV junction ablation and pacemaker implantation. We discussed the  potential for arrhythmic issues with the drugs as well as potential  complications with the device. I favor using Tikosyn initially because  of the ease of transition from it to amiodarone in the event that is  necessary.   We will plan to bring him in the hospital tomorrow. He would be  anticipated for cardioversion on Saturday morning with potential  discharge Sunday evening.     Duke Salvia, MD, [facility]  Electronically Signed    SCK/MedQ  DD: 05/30/2007  DT: 05/30/2007  Job #: [id number]

## 2011-04-12 NOTE — Assessment & Plan Note (Signed)
[facility]                                 ON-CALL NOTE   NAME:FISHERShadeed, Colberg                         MRN:          [id number]  DATE:04/23/2007                            DOB:          05-14-1925    TELEPHONE DICTATION  Phone # [phone]  Time and date of phone call:  8:26 a.m. Apr 22, 2005.   Patient of Dr. Jonny Ruiz.   Mr. Goddard is relatively new in the area.  He has a history of atrial  fibrillation.  I think he said he is on flecainide for that.  He now  feels like his heart is irregular, and he is in the 140s.  Fortunately,  he is comfortable now without chest pain but he calls for advice.   PLAN:  I told him he really needed emergency room evaluation now.  I  asked that he have someone drive him to [facility].  He does not have a  local cardiologist, and this way he can be established with one of our  cardiologists should he not convert right away.  He probably needs an  outpatient appointment even if he does convert in the emergency room.     Karie Schwalbe, MD  Electronically Signed    RIL/MedQ  DD: 04/23/2007  DT: 04/23/2007  Job #: [id number]

## 2011-04-12 NOTE — Discharge Summary (Signed)
NAMEMarland Kitchen  Casey, Hoffman NO.:  [id number]   MEDICAL RECORD NO.:  [id number]          PATIENT TYPE:  INP   LOCATION:  2031                         FACILITY:  [facility]   PHYSICIAN:  Duke Salvia, MD, FACCDATE OF BIRTH:  [birth year]   DATE OF ADMISSION:  05/31/2007  DATE OF DISCHARGE:  06/03/2007                               DISCHARGE SUMMARY   PRIMARY CARE PHYSICIAN:  Dr. Illene Regulus.   PRIMARY CARDIOLOGIST/EP:  Dr. Berton Mount.   DISCHARGING DIAGNOSIS:  Tikosyn loading secondary to atrial  fibrillation.   PAST MEDICAL HISTORY:  1. Atrial fibrillation which the patient has responded to with direct      current cardioversion and Betapace therapy in the past.  2. History of bile duct cancer status post Whipple procedure.  3. Diabetes.  4. Status post transesophageal echocardiogram and cardioversion June      2008 with the patient maintaining sinus rhythm for 5 days.  5. EF 25-30% with moderate mitral regurgitation per echocardiogram.  6. Status post appendectomy.  7. Status post stomach surgery for recurrent obstruction.   HOSPITAL COURSE:  [name] is a pleasant 77 year old gentleman with  past medical history of atrial fib who has been treated with Betapace  therapy for about 5-6 years.  The patient has responded to cardioversion  in the past.  Recent echocardiogram showed EF of 25-30% with moderate  MR.  Dr. Graciela Husbands was concerned that the patient may have tachycardia  mediated cardiomyopathy and decided to proceed with increased efforts to  maintain sinus rhythm through Tikosyn therapy.  The patient was admitted  for this purpose appropriate lab work obtained.  At time of discharge,  the patient is maintaining sinus rhythm.  QTCA 490, potassium 4.1,  however, magnesium 1.7.  The patient treated with 800 mg of magnesium  will be monitored for 3-4 hours if maintained sinus rhythm and no  apparent problems.  Tentatively planning on discharging the patient  home  around 6:00 p.m. tonight.   DISCHARGE MEDICATIONS:  The patient has been instructed to continue his  previous medications including:  1. Creon 2-3 capsules per meal.  2. Insulin as previously prescribed.  The patient, I believe was      taking 70/30 12 units in the a.m. and 14 units in the p.m.  3. Digoxin 0.125 mg daily.  4. Carvedilol 3.125 mg b.i.d.  5. Lisinopril 10 mg daily.  6. Coumadin as previously prescribed.  7. Flomax 0.4 mg daily.  8. The patient may resume previous vitamin supplements.   NEW MEDICATIONS:  1. Magnesium oxide 400 mg daily.  2. Tikosyn 500 mcg p.o. b.i.d.   The patient has been given prescriptions for both of these medications.   FOLLOWUP:  He is to follow up with Loura Pardon in 2-3 weeks and then with  Dr. Graciela Husbands in 6 weeks.  Our office has been notified and will call the  patient at home to arrange follow-up date and time.   OTHER PERTINENT LABS THIS ADMISSION:  PT/INR prior to discharge 26.8 and  2.3.   DURATION OF DISCHARGE ENCOUNTER:  Less than 30 minutes.      Dorian Pod, ACNP      Duke Salvia, MD, [facility]  Electronically Signed    MB/MEDQ  D:  06/03/2007  T:  06/04/2007  Job:  [id number]   cc:   Rosalyn Gess. Norins, MD

## 2011-04-12 NOTE — Letter (Signed)
Apr 27, 2007    Casey Gess. Norins, MD  [address]  [place], [place] [address]   RE:  [name], [name]  MRN:  [id number]  /  DOB:  12/13/1924   Dear [name]:   It was a pleasure to see [name] at your request today because of  atrial fibrillation.   As you know, he is an 77 year old gentleman who has recently moved from  [place]. 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   As mentioned, he has a history of atrial fibrillation dating to at back  to at least his Whipple in 1998. He has had recurrent episodes for which  he has also been cardioverted.   This has been largely identified by tachy-palpitations and as best as he  can recount, not significant symptoms of exercise intolerance. His  evaluations in the past included at least a Myoview scan which he was  told was normal, this was somewhere between six and eight years ago.  He does not recall it having had an intercurrent echo.   He has been managed on low dose at Sotalol at 40 mg twice daily.   What brought him to attention this time is that he had recurrent tachy-  palpitations which he became aware of over the weekend. He went to the  emergency room and was given intravenous Diltiazem with slowing of his  heart rate. He saw you and you kindly asked [place] to see him in  consultations for recommendations regarding his atrial fibrillation  management.   His thromboembolic risk factors are notable for diabetes, as well as  age. He does not have hypertension, prior stroke, or congestive heart  failure, and no known left ventricular dysfunction.   His past medical history, in addition to the above, is notable for  his  cancers.   His review of systems is otherwise broadly negative.   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   SOCIAL HISTORY:  He is married. He has children and grandchildren whose  care he is involved. He is actually going out to [place] to see his  grandson [name], retail from [employer]   He is retired from the Primary school teacher.   CURRENT MEDICATIONS:  1. Flomax 0.4 mg.  2. Humulin 70/30/12 in the a.m., 14 in the pm.  3. Sotalol 40 mg b.i.d.  Also, a variety of nutraceuticals, as well as aspirin 325 mg.   ALLERGIES:  PENICILLIN and CLINDAMYCIN.   PHYSICAL EXAMINATION:  GENERAL:  He is an elderly Caucasian male  appearing his stated age of 79.  VITAL SIGNS:  Blood pressure 110/62, pulse 123 and irregular, weight  193.  HEENT:  Demonstrated no icterus or __________.  NECK:  Flat, carotids were brisk and full bilaterally without bruits.  BACK:  Without kyphosis or scoliosis.  LUNGS:  Clear.  HEART:  Sounds were irregular with an early systolic murmur.  ABDOMEN:  Soft with active bowel sounds.  EXTREMITIES:  The femoral pulses were 2+, distal pulses were intact,  there  is no cyanosis, clubbing, or edema.  NEUROLOGIC:  Grossly normal.  SKIN:  Warm and dry.   Electrocardiogram dated today demonstrated atrial fibrillation at a rate  of 123 with interval __________ 0.10/0.32 with an axis that was mildly  leftward at -18.   IMPRESSION:  1. Atrial fibrillation with a rapid ventricular response - persistent.  2. Thromboembolic risk factors notable for diabetes and age.  3. Complex past carcinomatous history.   [name], Mr. Vanhook and his wife and I had a lengthy discussion  regarding atrial fibrillation and strategies for management including  rate controlled versus rhythm controlled. I have suggested that the use  of Sotalol at  its current dose is not effective as a rhythm controlling  agent and I would recommend its discontinuation. As a rate controlling  strategy initially, I think the use of Cardizem is reasonable and I have  given a prescription for 120 mg daily. I suspect that this will be  inadequate. However, I think we are going to be limited by his blood  pressure in this strategy. It may well be that he will need concomitant  Lanoxin or come down to a low dose beta blocker which he will tolerate  from blood pressure perspective.   In the event that we cannot manage this way, rhythm control is an  alternative strategy, either based on drug tolerance or symptoms. His  symptoms being relatively modest, I think that it will be a drug  toleration issue. Prior to initiating an angio-rhythmic drug, we will  need to know his coronary status and he would need to undergo repeat  Myoview scanning.   In the interim, I think it is important to understand what his left  ventricular function is, so I have taken the liberty of ordering an echo  for when we see him again in three weeks.   His thromboembolic risk factors gives him a [place] score of 2 with an  annualized risk of about 4%. Based on that, I have recommended  discontinuing his aspirin and beginning Coumadin.   RECOMMENDATIONS:  1. Stop his aspirin, begin Coumadin. We will see him in the Coumadin      clinic on Monday.  2. Begin Diltiazem 120 mg.  3. We will see him again in three weeks following his return from      [place].  4. Reassessment of his heart rate and anticipation of a cardioversion.   Thank you for the consultation.    Sincerely,      Duke Salvia, MD, [facility]  Electronically Signed    SCK/MedQ  DD: 04/27/2007  DT: 04/28/2007  Job #: [id number]   CC:    Corwin Levins, MD

## 2011-04-12 NOTE — Assessment & Plan Note (Signed)
[facility]                         ELECTROPHYSIOLOGY OFFICE NOTE   NAME:[name], [name]                       MRN:          [id number]  DATE:02/04/2009                            DOB:          11-12-1925    Mr. Zurn is seen in followup for atrial fibrillation for which he is  taking Tikosyn,  he has had no complaints.  He has had no arrhythmias.  There has been some modest fatigue, but he is not sure if this is  related to his years.   His medication list is legion and is notable for carvedilol 12.5 b.i.d.  He takes Tikosyn 0.5 b.i.d., Humulin, Creon, Coumadin, lisinopril 10,  finasteride 5, and Flomax.   On examination, his blood pressure today was 117/51, his pulse was 50.  His weight was 202 which is up 5 pounds in the last 6 months.  His neck  veins were flat.  His lungs were clear.  Heart sounds were regular  without an S4.  The abdomen was soft, and the extremities were without  edema.   Electrocardiogram dated today demonstrated sinus rhythm at 50 with  intervals of 0.18/0.09/0.48 with a QTc of 0.44.   IMPRESSION:  1. Atrial fibrillation - paroxysmal on Tikosyn with normal QT.  2. Bradycardia.  3. Resolved tachycardia-induced cardiomyopathy.  4. Previous problem with hyperkalemia.   Mr. Guiney is doing well without recurrent arrhythmia.  He does have  fatigue and I wonder whether this is related to his bradycardia.  As his  cardiomyopathy has resolved, we will plan to decrease his carvedilol to  6.25 twice a day.   I reviewed his laboratories.  The potassium last time was 5.0, down from  5.2.  We will plan to recheck a BMET and a magnesium level today.   We will plan to see him again in 6 months' time.     Duke Salvia, MD, [facility]  Electronically Signed    SCK/MedQ  DD: 02/04/2009  DT: 02/05/2009  Job #: [id number]

## 2011-04-12 NOTE — Discharge Summary (Signed)
NAME:  [name], RUBEY NO.:  [id number]   MEDICAL RECORD NO.:  [id number]          PATIENT TYPE:  INP   LOCATION:  4736                         FACILITY:  [facility]   PHYSICIAN:  Valerie A. Felicity Hoffman, MDDATE OF BIRTH:  [birth year]   DATE OF ADMISSION:  01/20/2008  DATE OF DISCHARGE:  01/30/2008                               DISCHARGE SUMMARY   DISCHARGE DIAGNOSES:  1. Abdominal pain with elevated liver function tests on admission      secondary to retained common hepatic duct stone, status post MRCP      noting mild left intrahepatic ductal dilation, status post upper      gastrointestinal series and a percutaneous cholangiogram with drain      placement January 25, 2008, with biliary culture positive for      Enterococcus including vancomycin-resistant enterococci.  Status      post stone extraction/sphincteroplasty January 29, 2008, with biliary      drain exchange.  2. Diabetes type 2.  3. Benign prostatic hypertrophy.  4. Atrial fibrillation.  5. Hypertension.  6. History of sarcoma of the right triceps.   HISTORY OF PRESENT ILLNESS:  [name] is an 77 year old white male  admitted on January 20, 2008, with chief complaint of right upper  quadrant abdominal pain.  He has a history of a Whipple procedure with  cholecystectomy secondary to growth in his bile ducts as well as  radiation chemotherapy in 1998.  He also has a history of coronary  artery disease and atrial fibrillation and he was concerned about the  pain as it was similar to the painful episodes he had had with his first  Whipple.  He was noted to have elevated transaminases and was admitted  for further evaluation and treatment.   PAST MEDICAL HISTORY:  1. History of appendectomy.  2. 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  3. Growth in the bile duct leading to a Whipple procedure and      cholecystectomy followed by radiation and chemo 1998.  4. BPH.  5. Diabetes type 2.  6. Secondary pancreatic insufficiency.  7. History of atrial fibrillation.  8. Hypertension.   HOSPITAL COURSE:  1. Right upper quadrant pain secondary to retained common hepatic duct      stone.  The patient was admitted and was seen initially in      consultation by [name] of [facility].  The patient then      underwent an MRCP which showed mild left intrahepatic ductal      dilation and question of a stone versus mass.  The patient then      underwent an upper GI series which noted a patent gastrojejunostomy      and sluggish primary peristalsis with small gastric nodule.  The      patient then underwent percutaneous drain placement and      cholangiogram on January 25, 2008, which was performed by      interventional radiology.  They did culture the bile drainage which  grew Enterococcus, two separate species.  He was treated      empirically with Rocephin for 10 days during this admission.  Both      species of enterococcus were sensitive to ampicillin.  As the      patient is afebrile without a white count, we do not plan any      further antibiotic therapy at this time.   The patient then underwent a stone extraction/sphincteroplasty on January 29, 2008, and as well as a brush biopsy which was performed at that  time.  Brush biopsy results are currently pending.  Follow up liver  function tests today are slightly elevated as compared to January 29, 2008, which according to the interventional radiology is not unexpected.  He will need follow-up LFTs as an outpatient to ensure they are trending  downward.  Plan at this time is for the patient to follow up next week  on February 07, 2008, with interventional radiology for follow-up  cholangiogram and possible stent placement versus drain removal.  He has  been bridged during this admission with full-dose Lovenox as his  Coumadin has been held.  In light of upcoming procedure, we will  continue the Lovenox  bridge at time of discharge.  The patient is  instructed not to take his Lovenox in the morning of this procedure.  He  is to follow up with [name] on February 08, 2008, at which time  they can discuss the possibility of resuming Coumadin as well as check  follow-up LFTs and review final brush biopsy results.   DISCHARGE MEDICATIONS:  1. Tikosyn 500 mcg p.o. b.i.d.  2. Flomax 0.4 mg p.o. daily.  3. Centrum Silver 1 tablet p.o. daily.  4. Vitamin C 500 mg p.o. daily.  5. Coreg 6.25 mg p.o. b.i.d.  6. Lisinopril 10 mg p.o. daily.  7. Protonix 40 mg p.o. daily.  8. Creon 10 two or three caps p.o. with meals.  9. Humulin 70/30 14 units in the morning and 15 units in the evening.  10.Lovenox 90 mg subcu injection twice daily, not to take in the a.m.      of February 07, 2008, prior to the procedure.   PERTINENT DISCHARGE LABORATORY DATA:  BUN 23, creatinine 1.07, sodium  135, potassium 4.  LFTs with AST 226, ALT 300, alk phos 268.  Hemoglobin  12.5, hematocrit 37, white blood cell count 7.9, platelets 217.   DISPOSITION:  The patient will be discharged to home.  We will ask a  home health RN for assistance with drain care as the patient will need  to have the drain flushed every eight hours with 5-10 mL of normal  saline.   FOLLOW UP:  The patient is to follow up with interventional radiology on  February 07, 2008, at 9:30 a.m. and with [name] on Friday,  February 08, 2008, at 3:30 p.m.  He will need follow-up of his brush biopsy  results as well as follow-up LFTs and discussion of further  anticoagulation plans at this time.      Casey Craze, NP      Casey Rover. Felicity Coyer, MD  Electronically Signed   MO/MEDQ  D:  01/30/2008  T:  01/30/2008  Job:  [id number]   cc:   [name], M.D.  Casey Fee, MD  Casey Gess. Norins, MD

## 2011-04-12 NOTE — Op Note (Signed)
NAME:  [name], [name] NO.:  [id number]   MEDICAL RECORD NO.:  [id number]          PATIENT TYPE:  AMB   LOCATION:  ENDO                         FACILITY:  [facility]   PHYSICIAN:  Bevelyn Buckles. Bensimhon, MDDATE OF BIRTH:  12-Feb-1925   DATE OF PROCEDURE:  05/24/2007  DATE OF DISCHARGE:                               OPERATIVE REPORT   PROCEDURE:  Direct current cardioversion.   REFERRING PHYSICIAN:  Duke Salvia, MD.   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   DESCRIPTION OF PROCEDURE:  The risks and benefits were explained.  Consent was signed and placed on the chart.   Prior to cardioversion he underwent a transesophageal echocardiogram  that showed an EF of 25-30% with moderate mitral regurgitation and a  severe left atrial enlargement.  The left atrial appendage was free of  any significant thrombus.   After the TEE the patient was further sedated by anesthesiology, Dr.  Jacklynn Bue.  Once adequate sedation was achieved, he received a single 150-  joule synchronized biphasic shock with a conversion to sinus rhythm.  There were no apparent complications.      Bevelyn Buckles. Bensimhon, MD  Electronically Signed     DRB/MEDQ  D:  05/24/2007  T:  05/24/2007  Job:  [id number]

## 2011-04-12 NOTE — H&P (Signed)
[facility] ADMISSION   NAME:Criger, DUAINE                         MRN:          [id number]  DATE:05/22/2007                            DOB:          [birth year]    HISTORY:  Mr. Torrez came in yesterday.  He continues to complain of  shortness of breath.  We had seen him a few weeks before and he was  found to be in atrial fibrillation with a rapid ventricular response.  An ultrasound done yesterday demonstrated LV dysfunction with an  estimated ejection fraction that was markedly decreases.  Attempts in  the interim to augment rate control had been ultimately unsatisfactory  with the inter-current addition of Cardizem, being successful.  We had  stopped his Sotalol and begun Cardizem.   PHYSICAL EXAMINATION:  VITAL SIGNS:  Yesterday his exam was notable for  a blood pressure of 112/58, pulse 88 and irregular.  NECK:  Veins were about 7 cm.  LUNGS:  Clear.  HEART:  Sounds were irregular.  EXTREMITIES:  Had trace edema.   Electrocardiogram dated yesterday was not obtained, as the patient's  echocardiogram had confirmed rapid atrial fibrillation.   IMPRESSION:  1. Atrial fibrillation with a rapid ventricular response.  2. Cardiomyopathy - hopefully tachycardia-mediated.  3. INR therapeutic x1 week.  4. Complex past medical history including a Whipple procedure, a      sarcoma, for which he has had chemotherapy and radiation therapy.  5. Thromboembolic risk factors notable for diabetes and hypertension,      and now congestive heart failure.   PLAN:  We will schedule an urgent TEE cardioversion tentatively for May 24, 2007.  I will start him on Coreg and this will need to be up-  titrated.  Will also begin him on an ACE inhibitor as his blood pressure  allows.  We will also begin him on Lanoxin.   Ultimately he will need a catheterization to clarify the presence or  absence of coronary artery disease as a  contributing factor to his  cardiomyopathy.     Duke Salvia, MD, [facility]  Electronically Signed    SCK/MedQ  DD: 05/23/2007  DT: 05/23/2007  Job #: [id number]

## 2011-04-12 NOTE — Assessment & Plan Note (Signed)
[facility]                         ELECTROPHYSIOLOGY OFFICE NOTE   NAME:[name]                         MRN:          [id number]  DATE:06/14/2007                            DOB:          Jul 24, 1925    [name] appears after hospitalization July 3 to July 6 for Tikosyn  therapy and loading.  He is an 77 year old male who developed atrial  fibrillation at age 16 after undergoing [name] procedure.  He had  recurrence of atrial fibrillation on Betapace and had ejection fraction  taken June 2008 of 25-30%.  It is feared that his atrial fibrillation  caused the tachycardia-mediated cardiomyopathy.  The patient was  admitted for Tikosyn therapy July 2 and was discharged in sinus rhythm.  On his office visit today, his electrocardiogram does show sinus rhythm  at a rate of 54.  QT is 464.  The QTc is 440.  He complains that he  still gets short of breath and, when he first arises, he is feeling  dizzy.   Blood pressure is 116/59, weight is 191.  Heart rate is 57.   Because the patient said he felt dizzy with first arising, his blood  pressure was taken after standing for approximately 5 minutes.  His  systolic pressure was 121 and diastolic pressure was 68.  At no time  during the time he was standing did he feel dizzy.   It is to be remembered that maintaining sinus rhythm avoids further  damage to the myocardium.  Of equal importance is adding Coreg for its  myocardial promoting properties.  The patient currently is taking Coreg  3.125 mg 1 tablet in the morning and 2 tablets in the evening.  He is a  little reticent to upgrade his dosage, but it has been explained to him  that, if he watches his blood pressure carefully on 2 in the morning and  2 in the evening, and finds that it dips, then he can discontinue and go  back to the 1 in the morning and 2 in the evening dosing.  To this  effect, he has been given a prescription for more 3.125 mg  Coreg.  Because of the importance in potassium maintaining antiarrhythmic status  in this patient, he will have a potassium drawn today.  He will then  have a 2D echocardiogram toward the end of August, and he will follow up  with Dr. Graciela Husbands after the 2D echocardiogram.  It is hoped that he has an  improved ejection fraction after what amounts to 6 weeks in sinus rhythm  and Tikosyn therapy.   MEDICATIONS:  1. Flomax 0.4 mg daily.  2. Creon 10 two to 3 capsules per meal.  3. Humulin 70/30 100 units per cc 14 units in the morning and 15 units      in the evening.  4. Centrum Silver 1 tablet daily.  5. Vitamin C 500 mg daily.  6. Tikosyn 500 mcg b.i.d.  7. Digoxin 0.125 mg daily.  8. Coreg 3.125 mg 1 tablet in the morning 2 tablets in the evening.  9. Lisinopril  10 mg daily.  10.Coreg 5 mg as directed.  11.Glucosamine chondroitin.  12.AmLactin.  13.Hypo Tears for the eyes.   PHYSICAL EXAMINATION:  Alert and oriented x3.  No complaints.  The  patient actually had chronic headaches, which got better on Coumadin.  LUNGS:  Clear.  HEART:  Rate is regular without murmur.  ABDOMEN:  Soft, nondistended.  No edema.   PAST MEDICAL HISTORY:  1. 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  2. The patient had post-procedural diabetes and atrial fibrillation.  3. He has chronic headaches, which are now better on Coumadin.  4. He had abdominal surgery for recurrent bowel obstruction.     [name], PA  Electronically Signed    GM/MedQ  DD: 06/14/2007  DT: 06/15/2007  Job #: [id number]

## 2011-04-12 NOTE — Letter (Signed)
March 03, 2008    [employer]   RE:  [name], [name]  MRN:  [id number]  /  DOB:  [birth year]   To Whom It May Concern:   [name] is a patient I follow for medical care.  He has had a  long and complex medical history including rather severe illness over  the last 12 months.   It is my medical opinion that Mr. [name] would be unable to tolerate the  rigors of a long distance trip and prolonged flight times.  I understand  he had made plans over a year ago for a trip to [place].  I do not  believe he would be able to tolerate this trip and it would be medically  hazardous for him.   Please extend him every possible courtesy in regards to refunding his  ticket since he is unable to make this trip for medical reasons.   Thank you very much for your consideration in  this matter.    Sincerely,      Rosalyn Gess. Norins, MD  Electronically Signed    MEN/MedQ  DD: 03/03/2008  DT: 03/03/2008  Job #: [id number]

## 2011-04-12 NOTE — Assessment & Plan Note (Signed)
[facility]                         GASTROENTEROLOGY OFFICE NOTE   NAME:Bacigalupo, KOLIN ERDAHL                       MRN:          [id number]  DATE:03/03/2008                            DOB:          16-Jun-1925    PROBLEM:  Choledocholithiasis.   Mr. Steagall has returned from hospitalization for abdominal pain.  It was  determined that he had a retained bile duct stone that was removed to  radiologic intervention.  He is status post Whipple procedure for bile  duct tumor.  While in the hospital he underwent a percutaneous  cholangiogram.  His bile duct was dilated at the ampule of Vater and the  stone was pushed through.  Since that time Mr Weimer has had no further  GI complaints specifically.  He is without pain or fever.   PHYSICAL EXAMINATION:  VITAL SIGNS:  Pulse 70.  Blood pressure 110/44.  Weight 192.   IMPRESSION:  Retained bile duct stone--status post stone removal via  PTC.  The patient is now asymptomatic.   RECOMMENDATIONS:  No further GI followup at this point.     Barbette Hair. Arlyce Dice, MD,FACG  Electronically Signed    RDK/MedQ  DD: 03/03/2008  DT: 03/03/2008  Job #: [id number]   cc:   Duke Salvia, MD, [facility]

## 2011-04-12 NOTE — H&P (Signed)
NAME:  [name], [name] NO.:  [id number]   MEDICAL RECORD NO.:  [id number]           PATIENT TYPE:   LOCATION:                               FACILITY:  [facility]   PHYSICIAN:  Hollice Espy, M.D.DATE OF BIRTH:  1925-07-05   DATE OF ADMISSION:  01/20/2008  DATE OF DISCHARGE:                              HISTORY & PHYSICAL   PRIMARY CARE PHYSICIAN:  Rosalyn Gess. Norins, M.D.   CONSULTANTS ON THIS CASE:  [facility].   CHIEF COMPLAINT:  Abdominal right upper quadrant pain.   HISTORY OF PRESENT ILLNESS:  The patient is an 77 year old white male  with a past medical history of sarcoma, status post radiation and  chemotherapy in 1991, and a Whipple procedure with cholecystectomy  secondary to a growth in the bile duct as well as radiation chemotherapy  in 1998, as well as history of CAD with atrial fibrillation.  The  patient otherwise has been in good health and then for the past week he  has had complaints of intermittent right upper quadrant pain.  He said  this was similar to his painful episodes when he first had his Whipple.  He was concerned about the possibility of problems there, so he came  into the emergency room today after the pain was more persistent today.  In the emergency room, he was noted to have a white count of 10.5 with a  slightly elevated neutrophil count of 78%.  The rest of his labs are  noted for an albumin of 3.4, transaminases in the 200s and an alkaline  phosphatase in the 400s.  His lipase level was normal.  His INR was  therapeutic.  The rest of his labs were unremarkable.  The patient's blood pressure initially was in the one teens but then  dipped down into the 70s requiring an IV fluid bolus which resumed his  blood pressure.  Currently the patient is doing well.  He denies any complaints.   REVIEW OF SYSTEMS:  He denies any headache, vision changes, dysphagia,  no chest pain, palpitations, shortness of breath, wheeze,  cough.  Currently he has no abdominal pain.  No hematuria, dysuria,  constipation, diarrhea, focal extremity numbness, weakness, or pain.  His review of systems is otherwise negative.   PAST MEDICAL HISTORY:  1. History of appendix removal.  2. 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  3. Growth in bile duct leading to a Whipple procedure and      cholecystectomy, followed by radiation and chemo in 1998.  4. History of BPH.  5. Secondary diabetes mellitus.  6. Secondary pancreatic insufficiency.  7. History of atrial fibrillation.  8. Hypertension.   MEDICATIONS:  1. Flomax 0.8 mg daily.  2. Creon 10 mg, two to three caplets t.i.d. with meals.  3. Insulin 70/30, 12 units in the morning, 16 units in the evening.  4. Multivitamin daily.  5. Vitamin C 500 daily.  6. Tikosyn 0.5 b.i.d.  7. Coreg 12.5 b.i.d.  8. Lisinopril 10 daily.  9. Coumadin  5 to 7.5 daily.  10.Glucosamine 1200.  11.Chondroitin 600.  12.Artificial Tears p.r.n.   ALLERGIES:  1. CLINDAMYCIN.  2. PENICILLIN.   SOCIAL HISTORY:  No tobacco, alcohol, or drug use.   FAMILY HISTORY:  Noncontributory.   PHYSICAL EXAMINATION:  VITAL SIGNS:  Temp 97.6, heart rate 72.  Blood  pressure 116/71, dipped down as low as 76/41, following IV fluids bolus  he was up to 106/60.  O2 sat 95% on room air.  Respirations 16.  GENERAL:  He is alert and oriented x3 in no apparent distress.  HEENT:  Normocephalic atraumatic.  Mucous membranes are moist.  He has  no carotid bruits.  HEART:  Regular rate and rhythm.  S1 S2.  Currently he is in a normal  sinus rhythm.  A 2/6 systolic ejection murmur.  LUNGS:  Clear to auscultation bilaterally.  ABDOMEN:  Soft, nontender, nondistended.  Positive bowel sounds.  Even  nontender in the right upper quadrant.  EXTREMITIES:  Showed no clubbing, cyanosis, or edema.   LABORATORY:  White count 10.5, H&H 13.1 and 39, MCV of 96, platelet  count 171,  78% neutrophils.  Sodium 135, potassium 5.3, chloride 103,  bicarb 26, BUN 18, creatinine 1, glucose 158.  LFTs are noted for an  albumin of 3.4 and an ST of 236, ALT of 236, alkaline phosphatase of  437, and total bili of 3.8.  Lipase normal.  INR 2.9.  UA notes just  trace leukocyte esterase, small amount of bilirubin, otherwise normal.  Blood culture have been drawn and pending.  In addition, his ultrasound  notes status post cholecystectomy but normal liver, no dilated ducts.   ASSESSMENT/PLAN:  1. Cholangitis.  Etiology is not immediately clear.  No evidence of      obstruction by ultrasound.  The patient is status post a Whipple      and cholecystectomy.  We will make nothing by mouth, intravenous      fluids.  I have spoken with Dr. Virginia Rochester of [facility] who      will see the patient.  In the meantime, we will cover with      antibiotics and we will order an MRCP to look for possible      obstruction.  The possibilities could be recurrent tumor or even      something else such as hepatitis.  We will order a hepatitis panel.  2. Diabetes mellitus.  Nothing by mouth.  Sliding scale only.  3. History of coronary artery disease with atrial fibrillation.  He is      currently in a normal sinus rhythm.  We will hold his Coumadin but      continue his Tikosyn.      Hollice Espy, M.D.  Electronically Signed     SKK/MEDQ  D:  01/20/2008  T:  01/20/2008  Job:  [id number]   cc:   Georgiana Spinner, M.D.  Rosalyn Gess Debby Bud, MD  Duke Salvia, MD, [facility]

## 2011-04-12 NOTE — Assessment & Plan Note (Signed)
[facility]                         ELECTROPHYSIOLOGY OFFICE NOTE   NAME:Dwan, ERRIK MITCHELLE                       MRN:          [id number]  DATE:08/06/2008                            DOB:          [birth year]    Mr. Seiden is seen in followup for atrial fibrillation and associated  tachycardia-induced cardiomyopathy, which is largely improved.  Ejection  fraction have improved from 25%-45%, when last assessed in August 2008.  He is doing well without recurrent anything, but very brief  palpitations.   His energy level was quite good.  He has little bit of dyspnea.   CURRENT MEDICATIONS:  1. Tikosyn 500 mcg b.i.d.  2. Coreg 12.5 b.i.d.  3. Lisinopril 10.  4. Coumadin.  5. Finasteride.  6. Flomax.  7. He is also on insulin.   PHYSICAL EXAMINATION:  VITAL SIGNS:  His blood pressure was 118/57 with  a pulse of 59.  LUNGS:  Clear.  NECK:  Veins were flat.  HEART:  Sounds were regular without murmurs.  ABDOMEN:  Soft.  EXTREMITIES:  No edema.   Electrocardiogram demonstrated sinus rhythm at 59 with intervals of  0.18/0.09/0.48.  The axis was leftward -23.   IMPRESSION:  1. Paroxysmal atrial fibrillation on Tikosyn.  2. QT prolongation but within range from paroxysmal atrial      fibrillation.  3. Resolved tachycardia-induced cardiomyopathy with ejection fraction      was recently of 45-50%.  4. Impending hernia surgery.   We will plan to check a potassium and magnesium today because of the  associated risks of Tikosyn therapy as well as renal function.   If he needs surgery, I would recommend with his thromboembolic risk  being relatively low that Coumadin be withheld and Lovenox NOT be used  unless risks are felt to be higher and just related to his  thromboembolic risk associated with his atrial fibrillation.   We will plan to see him again in 6 months' time.     Duke Salvia, MD, [facility]  Electronically Signed    SCK/MedQ  DD:  08/06/2008  DT: 08/07/2008  Job #: [id number]   cc:   Rosalyn Gess. Norins, MD

## 2011-04-12 NOTE — Letter (Signed)
February 20, 2008     RE:  LAZARO, ISENHOWER  MRN:  [id number]  /  DOB:  [birth year]   To whom it may concern:   Mr. Slyvester Latona is an 77 year old patient I follow for general medical  care.  The patient recently was hospitalized with a significant medical  illness involving obstruction of common bile duct with subsequent  infection.  This was a long and difficult hospitalization involving  multiple medical procedures including the placement of an indwelling  abdominal drain.  This drain was just recently removed without  complications.  Because of the patient's multiple medical problems  including this most recent bout of significant severe bile duct  obstruction with infection, he is not fit to travel at this time.   Please extend him every possible courtesy in regards to any potential  refund for purchased tickets he is unable to use at this time.   Thank you very much for your consideration in this matter.    Sincerely,      Rosalyn Gess. Norins, MD  Electronically Signed    MEN/MedQ  DD: 02/20/2008  DT: 02/20/2008  Job #: [id number]

## 2011-04-15 NOTE — Assessment & Plan Note (Signed)
[facility]                           GASTROENTEROLOGY OFFICE NOTE   NAME:[name], [name]                         MRN:          [id number]  DATE:10/04/2006                            DOB:          08/05/1925    REASON FOR VISIT/CONSULTATION:  Colonoscopy.   HISTORY OF THE PRESENT ILLNESS:  Mr. Rewerts is a pleasant 77 year old white  male referred through the courtesy of Dr. Jonny Ruiz for evaluation.  Colorectal  cancer screening by colonoscopy was requested.  Mr. Ehrmann has no GI  complaints, except for occasional hemorrhoidal discomfort.   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  This rendered him a diabetic.   PAST MEDICAL AND SURGICAL HISTORY:  The patient's past medical history is  pertinent for diabetes and chronic headaches.  He is status post  appendectomy and repeat operation for stomach blockage.   FAMILY HISTORY:  The family history is noncontributory.   MEDICATIONS:  The patient's medications include Flomax, Creon, insulin and  sotalol.   ALLERGIES:  THE PATIENT IS ALLERGIC TO PENICILLIN AND CLINDAMYCIN.   SOCIAL HISTORY:  The patient neither smokes nor drinks.  He is married and  retired.   REVIEW OF SYSTEMS:  The review of systems is reviewed and is negative.   PHYSICAL EXAMINATION:  VITAL SIGNS:  On exam pulse is 56, blood pressure  120/66 and weight 197 pounds.  HEENT :  Extraocular movements intact.  Pupils equal, round, and reactive to light and accommodation.  Sclerae are  anicteric. Conjunctivae are pink.  NECK:  Supple without thyromegaly,  adenopathy, or carotid bruits.  CHEST: Clear to auscultation and percussion  without adventitious sounds.  CARDIAC:  Regular rhythm; normal S1, S2.  There are no murmurs, gallops, or rubs.  ABDOMEN: Bowel sounds are  normoactive.  Abdomen is soft, nontender.  There are no abdominal  masses,  tenderness, splenic enlargement, or hepatomegaly.  EXTREMITIES: Full range  of motion.  No cyanosis, clubbing, or edema.  RECTAL:  The rectal exam is deferred.   IMPRESSION:  1. History of bile duct carcinoma.  2. Hemorrhoids.   RECOMMENDATIONS:  Screening colonoscopy.     Barbette Hair. Arlyce Dice, MD,FACG  Electronically Signed    RDK/MedQ  DD: 10/04/2006  DT: 10/04/2006  Job #: [id number]

## 2011-04-20 ENCOUNTER — Ambulatory Visit (INDEPENDENT_AMBULATORY_CARE_PROVIDER_SITE_OTHER): Payer: Medicare Other | Admitting: *Deleted

## 2011-04-20 DIAGNOSIS — I4891 Unspecified atrial fibrillation: Secondary | ICD-10-CM

## 2011-04-20 LAB — POCT INR: INR: 3

## 2011-04-29 ENCOUNTER — Other Ambulatory Visit: Payer: Self-pay | Admitting: Dermatology

## 2011-05-09 ENCOUNTER — Encounter: Payer: Self-pay | Admitting: Internal Medicine

## 2011-05-10 ENCOUNTER — Encounter: Payer: Self-pay | Admitting: Internal Medicine

## 2011-05-10 ENCOUNTER — Other Ambulatory Visit (INDEPENDENT_AMBULATORY_CARE_PROVIDER_SITE_OTHER): Payer: Medicare Other

## 2011-05-10 ENCOUNTER — Ambulatory Visit (INDEPENDENT_AMBULATORY_CARE_PROVIDER_SITE_OTHER): Payer: Medicare Other | Admitting: Internal Medicine

## 2011-05-10 DIAGNOSIS — K922 Gastrointestinal hemorrhage, unspecified: Secondary | ICD-10-CM

## 2011-05-10 DIAGNOSIS — K219 Gastro-esophageal reflux disease without esophagitis: Secondary | ICD-10-CM

## 2011-05-10 DIAGNOSIS — I4891 Unspecified atrial fibrillation: Secondary | ICD-10-CM

## 2011-05-10 DIAGNOSIS — E119 Type 2 diabetes mellitus without complications: Secondary | ICD-10-CM

## 2011-05-10 DIAGNOSIS — Z0000 Encounter for general adult medical examination without abnormal findings: Secondary | ICD-10-CM

## 2011-05-10 DIAGNOSIS — R51 Headache: Secondary | ICD-10-CM

## 2011-05-10 DIAGNOSIS — Z87898 Personal history of other specified conditions: Secondary | ICD-10-CM

## 2011-05-10 DIAGNOSIS — C248 Malignant neoplasm of overlapping sites of biliary tract: Secondary | ICD-10-CM

## 2011-05-10 LAB — CBC WITH DIFFERENTIAL/PLATELET
Basophils Relative: 0.7 % (ref 0.0–3.0)
Eosinophils Relative: 6.1 % — ABNORMAL HIGH (ref 0.0–5.0)
HCT: 38.6 % — ABNORMAL LOW (ref 39.0–52.0)
Hemoglobin: 13 g/dL (ref 13.0–17.0)
Lymphocytes Relative: 15.2 % (ref 12.0–46.0)
Lymphs Abs: 1.1 10*3/uL (ref 0.7–4.0)
Monocytes Relative: 11.1 % (ref 3.0–12.0)
Neutro Abs: 4.9 10*3/uL (ref 1.4–7.7)
RBC: 3.88 Mil/uL — ABNORMAL LOW (ref 4.22–5.81)
WBC: 7.3 10*3/uL (ref 4.5–10.5)

## 2011-05-10 LAB — COMPREHENSIVE METABOLIC PANEL
ALT: 28 U/L (ref 0–53)
BUN: 18 mg/dL (ref 6–23)
CO2: 27 mEq/L (ref 19–32)
Calcium: 9.1 mg/dL (ref 8.4–10.5)
Creatinine, Ser: 1.1 mg/dL (ref 0.4–1.5)
GFR: 71.53 mL/min (ref >60.00)
Glucose, Bld: 147 mg/dL — ABNORMAL HIGH (ref 70–99)
Total Bilirubin: 0.7 mg/dL (ref 0.3–1.2)

## 2011-05-10 LAB — HEPATIC FUNCTION PANEL
AST: 35 U/L (ref 0–37)
Albumin: 4.3 g/dL (ref 3.5–5.2)
Alkaline Phosphatase: 126 U/L — ABNORMAL HIGH (ref 39–117)
Bilirubin, Direct: 0.1 mg/dL (ref 0.0–0.3)
Total Protein: 7.6 g/dL (ref 6.0–8.3)

## 2011-05-10 NOTE — Progress Notes (Deleted)
  Subjective:    Patient ID: [name], male    DOB: 1925/01/29, 77 y.o.   MRN: [id number]  HPI    Review of Systems Review of Systems  Constitutional:  Negative for fever, chills, activity change and unexpected weight change.  HEENT:  Negative for hearing loss, ear pain, congestion, neck stiffness and postnasal drip. Negative for sore throat or swallowing problems. Negative for dental complaints.   Eyes: Negative for vision loss or change in visual acuity.  Respiratory: Negative for chest tightness and wheezing.   Cardiovascular: Negative for chest pain and palpitationNo decreased exercise tolerance Gastrointestinal: No change in bowel habit. No bloating or gas. No reflux or indigestion Genitourinary: Negative for urgency, frequency, flank pain and difficulty urinating.  Musculoskeletal: Negative for myalgias, arthralgias and gait problem. Mild back pain - controlled with exercise/stretch. Neurological: Negative for dizziness, tremors, weakness and headaches.  Hematological: Negative for adenopathy.  Psychiatric/Behavioral: Negative for behavioral problems and dysphoric mood.       Objective:   Physical Exam Vital signs reviewed Gen'l: Well nourished well developed white male in no acute distress  HENT:  Head: Normocephalic and atraumatic.  Right Ear: External ear normal. EAC/TM nl Left Ear: External ear normal.  EAC/TM nl Nose: Nose normal.  Mouth/Throat: Oropharynx is clear and moist. Dentition - native but missing many teeth, in good repair. No buccal or palatal lesions. Posterior pharynx clear. Eyes: Conjunctivae and sclera clear. EOM intact. Pupils are equal, round, and reactive to light. Right eye exhibits no discharge. Left eye exhibits no discharge. Neck: Normal range of motion. Neck supple. No JVD present. No tracheal deviation present. No thyromegaly present.  Cardiovascular: Normal rate, regular rhythm, no gallop, no friction rub, no murmur heard.      Quiet  precordium. 2+ radial and DP pulses . No carotid bruits Pulmonary/Chest: Effort normal. No respiratory distress or increased WOB, no wheezes, no rales. No chest wall deformity or CVAT. Abdominal: Soft. Bowel sounds are normal in all quadrants. He exhibits no distension, no tenderness, no rebound or guarding, No heptosplenomegaly  Genitourinary:  deferred to age. Musculoskeletal: Normal range of motion. He exhibits no edema and no tenderness.       Small and large joints without redness, synovial thickening or deformity. Full range of motion preserved about all small, median and large joints.  Lymphadenopathy:    He has no cervical or supraclavicular adenopathy.  Neurological: He is alert and oriented to person, place, and time. CN II-XII intact. DTRs 2+ and symmetrical biceps, radial and patellar tendons. Cerebellar function normal with no tremor, rigidity, normal gait and station.  Skin: Skin is warm and dry. No rash noted. No erythema.  Psychiatric: He has a normal mood and affect. His behavior is normal. Thought content normal.         Assessment & Plan:

## 2011-05-10 NOTE — Progress Notes (Signed)
Subjective:    Patient ID: [name], male    DOB: Jul 10, 1925, 77 y.o.   MRN: [id number]  HPI   The patient is here for annual Medicare wellness examination and management of other chronic and acute problems.   The risk factors are reflected in the social history.  The roster of all physicians providing medical care to patient - is listed in the Snapshot section of the chart.  Activities of daily living:  The patient is 100% inedpendent in all ADLs: dressing, toileting, feeding as well as independent mobility  Home safety : The patient has smoke detectors in the home. They wear seatbelts. Firearms are present in the home, kept in a safe fashion. There is no violence in the home.   There is no risks for hepatitis, STDs or HIV. There is history of blood transfusion. They have no travel history to infectious disease endemic areas of the world.  The patient has  seen their dentist in the last six month (04/21/2011). They have seen their eye doctor in the last year (01/03/2011) Normal eye exam no changes. They deny any hearing difficulty and have not had audiologic testing in the last year.  They do not  have excessive sun exposure. Discussed the need for sun protection: hats, long sleeves and use of sunscreen if there is significant sun exposure.   Diet: the importance of a healthy diet is discussed. They do have a healthy diet.  The patient has a regular exercise program: walking , 30 minute duration, 2 x per day.  The benefits of regular aerobic exercise were discussed.  Depression screen: there are no signs or vegative symptoms of depression- irritability, change in appetite, anhedonia, sadness/tearfullness.  Cognitive assessment: the patient manages all their financial and personal affairs and is actively engaged. They could relate day,date,year and events; recalled 3/3 objects at 3 minutes; performed clock-face test normally.  The following portions of the patient's history were  reviewed and updated as appropriate: allergies, current medications, past family history, past medical history,  past surgical history, past social history  and problem list.  Vision, hearing, body mass index were assessed and reviewed.   During the course of the visit the patient was educated and counseled about appropriate screening and preventive services including : fall prevention , diabetes screening, nutrition counseling, colorectal cancer screening, and recommended immunizations.   Review of Systems Review of Systems  Constitutional:  Negative for fever, chills, activity change and unexpected weight change.  HEENT:  Negative for hearing loss, ear pain, congestion, neck stiffness and postnasal drip. Negative for sore throat or swallowing problems. Negative for dental complaints.   Eyes: Negative for vision loss or change in visual acuity.  Respiratory: Negative for chest tightness and wheezing.   Cardiovascular: Negative for chest pain and palpitationNo decreased exercise tolerance Gastrointestinal: No change in bowel habit. No bloating or gas. No reflux or indigestion Genitourinary: Negative for urgency, frequency, flank pain and difficulty urinating.  Musculoskeletal: Negative for myalgias, back pain, arthralgias and gait problem.  Neurological: Negative for dizziness, tremors, weakness and headaches.  Hematological: Negative for adenopathy.  Psychiatric/Behavioral: Negative for behavioral problems and dysphoric mood.       Objective:   Physical Exam Vital signs reviewed Gen'l: Well nourished well developed     male in no acute distress  HENT:  Head: Normocephalic and atraumatic.  Right Ear: External ear normal. EAC/TM nl Left Ear: External ear normal.  EAC/TM nl Nose: Nose normal.  Mouth/Throat: Oropharynx is  clear and moist. Dentition - native, missing many teeth but the remainder are in good repair. No buccal or palatal lesions. Posterior pharynx clear. Eyes: Conjunctivae  and sclera clear. EOM intact. Pupils are equal, round, and reactive to light. Right eye exhibits no discharge. Left eye exhibits no discharge. Neck: Normal range of motion. Neck supple. No JVD present. No tracheal deviation present. No thyromegaly present.  Cardiovascular: Normal rate, regular rhythm, no gallop, no friction rub, no murmur heard.      Quiet precordium. 2+ radial and DP pulses . No carotid bruits Pulmonary/Chest: Effort normal. No respiratory distress or increased WOB, no wheezes, no rales. No chest wall deformity or CVAT. Abdominal: Soft. Bowel sounds are normal in all quadrants. He exhibits no distension, no tenderness, no rebound or guarding, No heptosplenomegaly  Genitourinary:   Musculoskeletal: Normal range of motion. He exhibits no edema and no tenderness.       Small and large joints without redness, synovial thickening or deformity. Full range of motion preserved about all small, median and large joints.  Lymphadenopathy:    He has no cervical or supraclavicular adenopathy.  Neurological: He is alert and oriented to person, place, and time. CN II-XII intact. DTRs 2+ and symmetrical biceps, radial and patellar tendons. Cerebellar function normal with no tremor, rigidity, normal gait and station.  Skin: Skin is warm and dry. No rash noted. No erythema.  Psychiatric: He has a normal mood and affect. His behavior is normal. Thought content normal.   Lab Results  Component Value Date   WBC 7.3 05/10/2011   HGB 13.0 05/10/2011   HCT 38.6* 05/10/2011   PLT 160.0 05/10/2011   CHOL 178 01/04/2010   TRIG 61.0 01/04/2010   HDL 46.40 01/04/2010   ALT 28 05/10/2011   ALT 28 05/10/2011   AST 35 05/10/2011   AST 35 05/10/2011   NA 136 05/10/2011   K 4.9 05/10/2011   CL 104 05/10/2011   CREATININE 1.1 05/10/2011   BUN 18 05/10/2011   CO2 27 05/10/2011   TSH 2.33 05/10/2011   PSA 2.28 01/04/2010   INR 3.0 04/20/2011   HGBA1C 7.3* 05/10/2011   Lab Results  Component Value Date   LDLCALC 119*  01/04/2010           Assessment & Plan:

## 2011-05-11 DIAGNOSIS — Z0000 Encounter for general adult medical examination without abnormal findings: Secondary | ICD-10-CM | POA: Insufficient documentation

## 2011-05-11 NOTE — Assessment & Plan Note (Signed)
Doing well, many years out form whipple.   Plan - continue pancreatic enzyme replacement

## 2011-05-11 NOTE — Assessment & Plan Note (Signed)
Doing well with no episodes of hypoglycemia. He adheres to a diabetic diet and medication regimen. A1C @ 7.3% reveals good control.  Plan - continue present regimen.

## 2011-05-11 NOTE — Assessment & Plan Note (Signed)
Followed by cardiology. Heis presently holding sinus rhythm on tikosyn, which he is tolerating well. He continues on warfarin with no bleeding incidents.  Plan - follow-up with cardiology as instructed.

## 2011-05-11 NOTE — Assessment & Plan Note (Signed)
Stable with no active symptoms on no medication.

## 2011-05-11 NOTE — Assessment & Plan Note (Signed)
Doing well with no report of significant nocturia or daytime frequency on no medication.

## 2011-05-11 NOTE — Assessment & Plan Note (Signed)
Interval history has been unremarkable and medically stable. Physical exam is normal. Labs from Feb and June reviewed and results are in normal range. He is current colorectal cancer screening with last colonoscopy Nov '07. Immunizations: Tetanus July '08. Due for pneumonia vaccine. He has had shingles and is not a candidate for zostavax.  In summary- a very nice man who has a complex medical history. He is doing well - appears to be medically stable. He will return as needed or in 1 year.

## 2011-05-12 NOTE — Progress Notes (Signed)
  Subjective:    Patient ID: [name], male    DOB: May 15, 1925, 77 y.o.   MRN: [id number]  HPI Mr. Dapolito had been bothered by chronic headach. He did see Dr. Vela Prose and tried a variety of medications which were unsuccessful. On his own he began using saline sinus washing techniques on a daily basis and over time his headaches have resolved.   Review of Systems     Objective:   Physical Exam        Assessment & Plan:

## 2011-05-18 ENCOUNTER — Ambulatory Visit (INDEPENDENT_AMBULATORY_CARE_PROVIDER_SITE_OTHER): Payer: Medicare Other | Admitting: *Deleted

## 2011-05-18 DIAGNOSIS — I4891 Unspecified atrial fibrillation: Secondary | ICD-10-CM

## 2011-06-15 ENCOUNTER — Ambulatory Visit (INDEPENDENT_AMBULATORY_CARE_PROVIDER_SITE_OTHER): Payer: Medicare Other | Admitting: *Deleted

## 2011-06-15 DIAGNOSIS — I4891 Unspecified atrial fibrillation: Secondary | ICD-10-CM

## 2011-06-15 LAB — POCT INR: INR: 2.1

## 2011-07-06 ENCOUNTER — Ambulatory Visit (INDEPENDENT_AMBULATORY_CARE_PROVIDER_SITE_OTHER): Payer: Medicare Other | Admitting: Internal Medicine

## 2011-07-06 ENCOUNTER — Encounter: Payer: Self-pay | Admitting: Internal Medicine

## 2011-07-06 VITALS — BP 102/60 | HR 64 | Temp 97.0°F | Ht 75.0 in | Wt 197.8 lb

## 2011-07-06 DIAGNOSIS — M25529 Pain in unspecified elbow: Secondary | ICD-10-CM

## 2011-07-06 NOTE — Patient Instructions (Signed)
I have spoken with Dr. Dion Saucier at [facility] - Go right over and he will see you today

## 2011-07-06 NOTE — Progress Notes (Signed)
  Subjective:    Patient ID: [name], male    DOB: 01/25/1925, 77 y.o.   MRN: [id number]  HPI  complains of R elbow pain Pain 10/10 at this time Denies trauma - rapidly reached to catch a drill that was falling to floor and felt "snap"in elbow -(did not catch or hit anything during motion)  Onset <6 hours ago (1030 this AM) Hx remote triceps surgery same arm '91 due to sarcoma resection - s/p chemo and XRT ([facility]) Family wrapped elbow with ACE wrap and support with elastic band and homemade dishcloth sling - but pain continues to increase No numbness or swelling in hand, no shoulder pain  Past Medical History  Diagnosis Date  . Acid reflux disease     history of  . H/O: GI bleed   . Paroxysmal atrial fibrillation     chronic anticoag; tikosyn  . Benign prostatic hypertrophy     history of  . Hepatic damage march 2009    retained hepatic stone , intrahepatic duct catheter  . Diabetes mellitus     insulin dep  . CAD (coronary artery disease)   . Hypertension   . 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  Gastrointestinal: Positive for nausea (due to pain).  Musculoskeletal: Negative for back pain.       Objective:   Physical Exam BP 102/60  Pulse 64  Temp(Src) 97 F (36.1 C) (Oral)  Ht 6\' 3"  (1.905 m)  Wt 197 lb 12.8 oz (89.721 kg)  BMI 24.72 kg/m2  SpO2 97% Constitutional: tall and thin, elderly but mental spry WM; obvious distress and discomfort - wife at side.  Mskel: R arm in 90 degree flexion and held with homemade sling against body. Painful efforts at ROM when support removed - bony crepitus in elbow with attempted extension, active and passive motion. Diffuse soft tissue swelling surrounding elbow, no open wounds. neurovasc intact. Wrist and shoulder  nontender to palpitation of joint. Cardiovascular: Normal rate, regular rhythm and normal heart sounds.  No murmur heard. no BLE edema Pulmonary/Chest: Effort normal and breath sounds normal. No respiratory distress. no wheezes.  Skin: Pale and diaphoretic with pain.  No bruising, erythema or ulceration.        Assessment & Plan:  R elbow injury - suspect tendon rupture vs elbow dislocation vs path fx - refer urgent to ortho for further eval and tx/splint etc Spoke with Dr. Dion Saucier via phone re: same - will see in their office this afternoon -  Defer rx for pain mgmt to ortho and xrays to be done at their office to expedite eval this afternoon Time spent with pt/family today 25 minutes, greater than 50% time spent discussing elbow injury and arranging urgent ortho eval today for same. Also review of prior records

## 2011-07-11 ENCOUNTER — Telehealth: Payer: Self-pay | Admitting: *Deleted

## 2011-07-11 NOTE — Telephone Encounter (Signed)
Yes and yes 

## 2011-07-11 NOTE — Telephone Encounter (Signed)
Pt was seen by Dr Felicity Coyer - dx w/fracture and sent to ortho. Wife called and wanted to ensure that Dr Debby Bud is aware of pt's situation. Also wants to know if Dr Debby Bud agrees with pt's treatment?

## 2011-07-12 NOTE — Telephone Encounter (Signed)
Patient informed. 

## 2011-07-13 ENCOUNTER — Encounter: Payer: Medicare Other | Admitting: *Deleted

## 2011-07-18 ENCOUNTER — Other Ambulatory Visit: Payer: Self-pay | Admitting: Cardiology

## 2011-07-18 ENCOUNTER — Ambulatory Visit (INDEPENDENT_AMBULATORY_CARE_PROVIDER_SITE_OTHER): Payer: Medicare Other | Admitting: *Deleted

## 2011-07-18 DIAGNOSIS — I4891 Unspecified atrial fibrillation: Secondary | ICD-10-CM

## 2011-07-18 MED ORDER — WARFARIN SODIUM 5 MG PO TABS: ORAL_TABLET | ORAL | Status: DC

## 2011-07-18 NOTE — Telephone Encounter (Signed)
Med refill

## 2011-08-02 ENCOUNTER — Encounter: Payer: Medicare Other | Admitting: *Deleted

## 2011-08-03 ENCOUNTER — Telehealth: Payer: Self-pay | Admitting: Internal Medicine

## 2011-08-03 NOTE — Telephone Encounter (Signed)
linsinopril 10 mg, carvedilol 12.5 mg refill, uses [facility]

## 2011-08-08 ENCOUNTER — Other Ambulatory Visit: Payer: Self-pay | Admitting: *Deleted

## 2011-08-08 MED ORDER — CARVEDILOL 12.5 MG PO TABS: 6.2500 mg | ORAL_TABLET | Freq: Two times a day (BID) | ORAL | Status: DC

## 2011-08-08 MED ORDER — LISINOPRIL 10 MG PO TABS: 10.0000 mg | ORAL_TABLET | Freq: Every day | ORAL | Status: DC

## 2011-08-09 NOTE — Telephone Encounter (Signed)
Pt calling stating rx's not yet called in needs asap, please

## 2011-08-10 ENCOUNTER — Other Ambulatory Visit: Payer: Self-pay | Admitting: *Deleted

## 2011-08-10 MED ORDER — CARVEDILOL 12.5 MG PO TABS: 12.5000 mg | ORAL_TABLET | Freq: Two times a day (BID) | ORAL | Status: DC

## 2011-08-10 MED ORDER — LISINOPRIL 10 MG PO TABS: 10.0000 mg | ORAL_TABLET | Freq: Every day | ORAL | Status: DC

## 2011-08-15 ENCOUNTER — Ambulatory Visit (INDEPENDENT_AMBULATORY_CARE_PROVIDER_SITE_OTHER): Payer: Medicare Other | Admitting: *Deleted

## 2011-08-15 DIAGNOSIS — I4891 Unspecified atrial fibrillation: Secondary | ICD-10-CM

## 2011-08-15 LAB — POCT INR: INR: 2.3

## 2011-08-19 LAB — DIFFERENTIAL
Basophils Absolute: 0
Basophils Relative: 0
Eosinophils Relative: 0
Lymphocytes Relative: 7 — ABNORMAL LOW

## 2011-08-19 LAB — PROTIME-INR
INR: 1.1
INR: 2.9 — ABNORMAL HIGH
Prothrombin Time: 15.2
Prothrombin Time: 21.4 — ABNORMAL HIGH
Prothrombin Time: 31.6 — ABNORMAL HIGH

## 2011-08-19 LAB — URINALYSIS, ROUTINE W REFLEX MICROSCOPIC
Ketones, ur: 15 — AB
Nitrite: NEGATIVE
Protein, ur: NEGATIVE
Specific Gravity, Urine: 1.018
Urobilinogen, UA: 1

## 2011-08-19 LAB — COMPREHENSIVE METABOLIC PANEL
ALT: 177 — ABNORMAL HIGH
AST: 136 — ABNORMAL HIGH
AST: 236 — ABNORMAL HIGH
Albumin: 2.8 — ABNORMAL LOW
Alkaline Phosphatase: 437 — ABNORMAL HIGH
BUN: 18
CO2: 26
CO2: 26
Chloride: 103
Chloride: 104
Creatinine, Ser: 0.98
GFR calc Af Amer: >60
GFR calc Af Amer: >60
GFR calc non Af Amer: >60
GFR calc non Af Amer: >60
Potassium: 5.3 — ABNORMAL HIGH
Sodium: 136
Total Bilirubin: 2.3 — ABNORMAL HIGH
Total Bilirubin: 3.8 — ABNORMAL HIGH

## 2011-08-19 LAB — HEPATIC FUNCTION PANEL
ALT: 135 — ABNORMAL HIGH
ALT: 193 — ABNORMAL HIGH
AST: 163 — ABNORMAL HIGH
Albumin: 2.9 — ABNORMAL LOW
Alkaline Phosphatase: 347 — ABNORMAL HIGH
Bilirubin, Direct: 0.3
Indirect Bilirubin: 0.6
Total Protein: 6
Total Protein: 6

## 2011-08-19 LAB — BODY FLUID CULTURE: Gram Stain: NONE SEEN

## 2011-08-19 LAB — CBC
HCT: 35.4 — ABNORMAL LOW
HCT: 38.6 — ABNORMAL LOW
MCHC: 33.5
MCHC: 33.9
MCV: 95.8
MCV: 96.6
MCV: 97
Platelets: 180
RBC: 3.67 — ABNORMAL LOW
RBC: 3.73 — ABNORMAL LOW
RBC: 4.03 — ABNORMAL LOW
RDW: 13.6
WBC: 10.5

## 2011-08-19 LAB — HEMOGLOBIN A1C
Hgb A1c MFr Bld: 6.7 — ABNORMAL HIGH
Mean Plasma Glucose: 143
Mean Plasma Glucose: 161

## 2011-08-19 LAB — CULTURE, BLOOD (ROUTINE X 2)

## 2011-08-19 LAB — URINE CULTURE
Colony Count: NO GROWTH
Culture: NO GROWTH

## 2011-08-19 LAB — URINE MICROSCOPIC-ADD ON

## 2011-08-22 LAB — COMPREHENSIVE METABOLIC PANEL
ALT: 214 — ABNORMAL HIGH
ALT: 313 — ABNORMAL HIGH
Albumin: 3.1 — ABNORMAL LOW
Albumin: 3.3 — ABNORMAL LOW
Alkaline Phosphatase: 252 — ABNORMAL HIGH
Alkaline Phosphatase: 268 — ABNORMAL HIGH
Alkaline Phosphatase: 289 — ABNORMAL HIGH
BUN: 18
BUN: 21
Calcium: 8.9
Creatinine, Ser: 1.08
Glucose, Bld: 130 — ABNORMAL HIGH
Glucose, Bld: 135 — ABNORMAL HIGH
Potassium: 3.8
Potassium: 4
Potassium: 4.3
Sodium: 132 — ABNORMAL LOW
Sodium: 134 — ABNORMAL LOW
Total Protein: 6.2
Total Protein: 6.5
Total Protein: 6.8

## 2011-08-22 LAB — CBC
HCT: 35.6 — ABNORMAL LOW
HCT: 37 — ABNORMAL LOW
Hemoglobin: 12.4 — ABNORMAL LOW
Hemoglobin: 12.5 — ABNORMAL LOW
MCHC: 33.7
MCHC: 33.8
MCHC: 34.7
MCV: 95.3
MCV: 96.3
MCV: 97.2
Platelets: 192
Platelets: 202
Platelets: 218
RBC: 3.75 — ABNORMAL LOW
RDW: 13.5
RDW: 13.6
RDW: 13.7
WBC: 6.5

## 2011-08-22 LAB — APTT: aPTT: 34

## 2011-08-22 LAB — PROTIME-INR
INR: 1.1
INR: 1.1
Prothrombin Time: 13.1
Prothrombin Time: 13.9
Prothrombin Time: 14.1
Prothrombin Time: 14.2

## 2011-08-22 LAB — DIFFERENTIAL
Basophils Relative: 0
Lymphs Abs: 0.8
Monocytes Absolute: 0.8
Monocytes Relative: 11
Neutro Abs: 5.1

## 2011-08-22 LAB — BASIC METABOLIC PANEL
CO2: 25
Chloride: 100
Glucose, Bld: 135 — ABNORMAL HIGH
Potassium: 4
Sodium: 135

## 2011-08-22 LAB — HEPATIC FUNCTION PANEL
Bilirubin, Direct: 0.4 — ABNORMAL HIGH
Total Bilirubin: 1

## 2011-08-29 LAB — COMPREHENSIVE METABOLIC PANEL
ALT: 25
Alkaline Phosphatase: 107
BUN: 23
CO2: 27
Chloride: 102
GFR calc non Af Amer: >60
Glucose, Bld: 128 — ABNORMAL HIGH
Potassium: 5
Sodium: 138
Total Bilirubin: 0.7
Total Protein: 6.5

## 2011-08-29 LAB — CBC
HCT: 37.3 — ABNORMAL LOW
Hemoglobin: 12.3 — ABNORMAL LOW
RBC: 3.73 — ABNORMAL LOW
RDW: 13.8

## 2011-08-29 LAB — GLUCOSE, CAPILLARY
Glucose-Capillary: 102 — ABNORMAL HIGH
Glucose-Capillary: 116 — ABNORMAL HIGH
Glucose-Capillary: 162 — ABNORMAL HIGH

## 2011-08-29 LAB — PROTIME-INR: INR: 2.2 — ABNORMAL HIGH

## 2011-08-29 LAB — DIFFERENTIAL
Basophils Absolute: 0
Basophils Relative: 1
Eosinophils Absolute: 0.4
Neutro Abs: 3.6
Neutrophils Relative %: 62

## 2011-09-12 ENCOUNTER — Ambulatory Visit (INDEPENDENT_AMBULATORY_CARE_PROVIDER_SITE_OTHER): Payer: Medicare Other | Admitting: *Deleted

## 2011-09-12 DIAGNOSIS — I4891 Unspecified atrial fibrillation: Secondary | ICD-10-CM

## 2011-09-12 LAB — POCT INR: INR: 3.1

## 2011-09-13 LAB — BASIC METABOLIC PANEL
BUN: 13
BUN: 18
BUN: 18
BUN: 21
CO2: 27
Calcium: 8 — ABNORMAL LOW
Chloride: 102
Creatinine, Ser: 1.02
GFR calc Af Amer: >60
GFR calc non Af Amer: >60
GFR calc non Af Amer: >60
GFR calc non Af Amer: >60
Glucose, Bld: 106 — ABNORMAL HIGH
Glucose, Bld: 120 — ABNORMAL HIGH
Glucose, Bld: 72
Potassium: 3.9
Potassium: 4
Potassium: 4.7
Sodium: 136
Sodium: 136
Sodium: 138

## 2011-09-13 LAB — PROTIME-INR
INR: 2.3 — ABNORMAL HIGH
INR: 2.4 — ABNORMAL HIGH
Prothrombin Time: 25.7 — ABNORMAL HIGH
Prothrombin Time: 26.8 — ABNORMAL HIGH
Prothrombin Time: 27.2 — ABNORMAL HIGH

## 2011-09-13 LAB — MAGNESIUM: Magnesium: 1.7

## 2011-09-14 LAB — PROTIME-INR
INR: 2 — ABNORMAL HIGH
Prothrombin Time: 23.9 — ABNORMAL HIGH

## 2011-09-14 LAB — BASIC METABOLIC PANEL
BUN: 18
Calcium: 9
GFR calc non Af Amer: >60
Glucose, Bld: 144 — ABNORMAL HIGH
Sodium: 137

## 2011-09-14 LAB — CBC
Platelets: 234
RDW: 13.5

## 2011-09-20 ENCOUNTER — Ambulatory Visit (INDEPENDENT_AMBULATORY_CARE_PROVIDER_SITE_OTHER): Payer: Medicare Other | Admitting: Internal Medicine

## 2011-09-20 ENCOUNTER — Encounter: Payer: Self-pay | Admitting: Internal Medicine

## 2011-09-20 VITALS — BP 106/56 | HR 71 | Ht 75.0 in | Wt 192.0 lb

## 2011-09-20 DIAGNOSIS — I4891 Unspecified atrial fibrillation: Secondary | ICD-10-CM

## 2011-09-20 MED ORDER — DOFETILIDE 500 MCG PO CAPS: 500.0000 ug | ORAL_CAPSULE | Freq: Two times a day (BID) | ORAL | Status: DC

## 2011-09-20 NOTE — Patient Instructions (Addendum)
Your physician recommends that you have lab work today: bmp/magnesium  Your physician wants you to follow-up in: 6 months with Dr. Graciela Husbands. You will receive a reminder letter in the mail two months in advance. If you don't receive a letter, please call our office to schedule the follow-up appointment.  .Your physician has recommended you make the following change in your medication:  1) Decrease lisinopril to 1/2 tablet by mouth once daily. Marland Kitchen

## 2011-09-20 NOTE — Progress Notes (Signed)
HPI  [name] is a 77 y.o. male  seen in followup for paroxysmal atrial fibrillation for which he takes Tikosyn.  he takes Coumadin for thomboembolic risk reduction given his history of hypertension diabetes and age. He continues to do amazingly well, notwithstanding the constellation of his past medical history  The patient denies SOB, chest pain, edema or palpitations   He fell recently tried to grab for his drill and fractured his humerus. This is the site of a previous sarcoma and radiation therapy   Past Medical History  Diagnosis Date  . Acid reflux disease     history of  . H/O: GI bleed   . Paroxysmal atrial fibrillation     chronic anticoag; tikosyn  . Benign prostatic hypertrophy     history of  . Hepatic damage march 2009    retained hepatic stone , intrahepatic duct catheter  . Diabetes mellitus     insulin dep  . CAD (coronary artery disease)   . Hypertension   . 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 Cardioversion   . Cholecystectomy     history of  . Partial gastrectomy     history of  . Shoulder surgery     left shoulder repair with 2 pens inserted  . Whipple procedure     operation  . 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 Inguinal hernia repair     inguinal herniorrhaphy, right... history of  . Appendectomy     history of    Current Outpatient Prescriptions  Medication Sig Dispense Refill  . Ascorbic Acid (VITAMIN C) 500 MG tablet Take 500 mg by mouth daily.        . carvedilol (COREG) 12.5 MG tablet Take 6.25 mg by mouth 2 (two) times daily with a meal.        . [facility] GLUCOSAMINE-CHONDROITIN PO Take by mouth. 1200/600 mg       . Emollient (AMLACTIN XL) LOTN Apply lotion to legs once daily       . HUMULIN 70/30 70-30 % injection 10 units in the and 15 units in  the pm      . Insulin Syringes, Disposable, (B-D INSULIN SYRINGE 1CC) U-100 1 ML MISC For use with insulin       . lisinopril (PRINIVIL,ZESTRIL) 10 MG tablet Take 1 tablet (10 mg total) by mouth daily. Take one tablet by mouth daily  90 tablet  3  . Multiple Vitamins-Minerals (CENTRUM SILVER PO) Take by mouth daily.        . Pancrelipase, Lip-Prot-Amyl, (CREON) 12000 UNITS CPEP 1 capsule 5 (five) times daily.        . Tamsulosin HCl (FLOMAX) 0.4 MG CAPS Take 0.4 mg by mouth 2 (two) times daily.        Marland Kitchen TIKOSYN 500 MCG capsule Take 500 mcg by mouth. Take 1 tablet every 12 hours      . triamcinolone (KENALOG) 0.1 % cream Apply to left leg two times daily       . warfarin (COUMADIN) 5 MG tablet Take as directed by Coumadin clinic.  120 tablet  0  . DISCONTD: carvedilol (COREG) 12.5 MG tablet Take 1 tablet (12.5 mg total) by mouth 2 (two) times daily with a meal.  180  tablet  3    Allergies  Allergen Reactions  . Clindamycin   . Oxycodone-Acetaminophen   . Penicillins     REACTION: causes hives    Review of Systems negative except from HPI and PMH  Physical Exam Well developed and well nourished in no acute distress HENT normal E scleral and icterus clear Neck Supple JVP flat; carotids brisk and full Clear to ausculation Regular rate and rhythm, no murmurs gallops or rub Soft with active bowel sounds No clubbing cyanosis ; right arm is in a sling with hand edema which he says is better Alert and oriented, grossly normal motor and sensory function Skin Warm and Dry  ECG Sinus rhythm with a QTC of approximately 450 ms  Assessment and  Plan

## 2011-09-20 NOTE — Assessment & Plan Note (Signed)
Paroxysmal atrial fibrillation and holding sinus rhythm. He is on warfarin and Tikosyn. QTC is appropriate. We'll check potassium and magnesium levels today.

## 2011-09-21 LAB — BASIC METABOLIC PANEL
BUN: 24 mg/dL — ABNORMAL HIGH (ref 6–23)
Calcium: 8.9 mg/dL (ref 8.4–10.5)
Creatinine, Ser: 1.1 mg/dL (ref 0.4–1.5)
GFR: 70.69 mL/min (ref >60.00)

## 2011-09-21 LAB — MAGNESIUM: Magnesium: 2 mg/dL (ref 1.5–2.5)

## 2011-10-10 ENCOUNTER — Ambulatory Visit (INDEPENDENT_AMBULATORY_CARE_PROVIDER_SITE_OTHER): Payer: Medicare Other | Admitting: *Deleted

## 2011-10-10 DIAGNOSIS — I4891 Unspecified atrial fibrillation: Secondary | ICD-10-CM

## 2011-10-12 ENCOUNTER — Encounter: Payer: Self-pay | Admitting: Gastroenterology

## 2011-11-07 ENCOUNTER — Ambulatory Visit (INDEPENDENT_AMBULATORY_CARE_PROVIDER_SITE_OTHER): Payer: Medicare Other | Admitting: *Deleted

## 2011-11-07 DIAGNOSIS — I4891 Unspecified atrial fibrillation: Secondary | ICD-10-CM

## 2011-11-07 MED ORDER — WARFARIN SODIUM 5 MG PO TABS: ORAL_TABLET | ORAL | Status: DC

## 2011-12-05 ENCOUNTER — Ambulatory Visit (INDEPENDENT_AMBULATORY_CARE_PROVIDER_SITE_OTHER): Payer: Medicare Other | Admitting: *Deleted

## 2011-12-05 DIAGNOSIS — I4891 Unspecified atrial fibrillation: Secondary | ICD-10-CM

## 2011-12-26 ENCOUNTER — Ambulatory Visit (INDEPENDENT_AMBULATORY_CARE_PROVIDER_SITE_OTHER): Payer: Medicare Other | Admitting: Pharmacist

## 2011-12-26 DIAGNOSIS — I4891 Unspecified atrial fibrillation: Secondary | ICD-10-CM

## 2012-01-23 ENCOUNTER — Ambulatory Visit (INDEPENDENT_AMBULATORY_CARE_PROVIDER_SITE_OTHER): Payer: Medicare Other | Admitting: *Deleted

## 2012-01-23 DIAGNOSIS — I4891 Unspecified atrial fibrillation: Secondary | ICD-10-CM

## 2012-02-06 ENCOUNTER — Telehealth: Payer: Self-pay | Admitting: Internal Medicine

## 2012-02-06 DIAGNOSIS — I4891 Unspecified atrial fibrillation: Secondary | ICD-10-CM

## 2012-02-06 NOTE — Telephone Encounter (Signed)
I spoke with the patient. He states that last night he noticed his HR was up. He doesn't think this feels like a-fib, but his rates are in the low 100's. He was 107-114 this morning. He states he had the same thing about 2 weeks ago. His rates were up at night and then back down to 60 in the morning. Per the patient's wife, he feels "wiped" out when his rates are up. I explained to the patient that he may be back in a-fib. He is on Tikosyn 500 mcg BID and Coreg 6.25 mg BID. I explained I will review with Dr. Graciela Husbands and call him back. He is agreeable.

## 2012-02-06 NOTE — Telephone Encounter (Signed)
I spoke with Dr. Graciela Husbands about the patient's symptoms. He would like the patient to wear an event recorder and follow up with him in about 4-5 weeks. The patient is aware of Dr. Odessa Fleming recommendations. I will order his monitor and make Windell Moulding aware.

## 2012-02-06 NOTE — Telephone Encounter (Signed)
New msg Pt's wife called and he takes [place] and she said his heart rate has been going up. HR-107, BP 110/75 Please call

## 2012-02-08 ENCOUNTER — Encounter: Payer: Self-pay | Admitting: *Deleted

## 2012-02-08 ENCOUNTER — Telehealth: Payer: Self-pay | Admitting: Internal Medicine

## 2012-02-08 NOTE — Telephone Encounter (Signed)
[facility] [phone]  Please return call to PT, who needs clearance for patient to participate with heart monitor on. PT will also need prescription stating it is ok for patient to participate, Fax # [phone].  Verlee Monte can be reached at [phone].

## 2012-02-08 NOTE — Telephone Encounter (Signed)
Spoke with [facility]ora, okay given for pt to participate in physical therapy while wearing event monitor. Note faxed to the number provided

## 2012-02-13 ENCOUNTER — Ambulatory Visit (INDEPENDENT_AMBULATORY_CARE_PROVIDER_SITE_OTHER): Payer: Medicare Other

## 2012-02-13 ENCOUNTER — Encounter (INDEPENDENT_AMBULATORY_CARE_PROVIDER_SITE_OTHER): Payer: Medicare Other

## 2012-02-13 DIAGNOSIS — I4891 Unspecified atrial fibrillation: Secondary | ICD-10-CM

## 2012-02-17 ENCOUNTER — Telehealth: Payer: Self-pay | Admitting: Internal Medicine

## 2012-02-17 DIAGNOSIS — I4891 Unspecified atrial fibrillation: Secondary | ICD-10-CM

## 2012-02-17 NOTE — Telephone Encounter (Signed)
I reviewed the event recorder undertaken because of palpitations. He demonstrated recurrent atrial fibrillation with a rapid ventricular response.  It also demonstrated on 3/21 a pause associated nonsustained run of wide-complex tachycardia. The initial premature beat triggering the pause is clearly a PAC. The 3 beats of wide complex tachycardia that follow the pause the I think are ventricular origin. I do not see a disruption in the T wave pattern to suggest a PAC.  As such I am concerned that this is pause dependent polymorphic ventricular tachycardia potentially manifesting proarrhythmia ofTikosyn.  I called the patient and instructed him to stop this medicine. We will begin him next week on amiodarone 400 mg twice daily for 2 weeks followed by 4 mg daily for 2 weeks and then 200 mg a day. We have reviewed the side effects. He will need thyroid function testing and liver testing done next week as a baseline. His last TSH was in June.  We will also obtain an echo to look at left atrial dimensions and left ventricular function. He has a history of coronary disease so is not a candidate for A 1c agent we also discussed the potential issue of bradycardia as he has sinus bradycardia already and the potential need for  pacing.Marland Kitchen

## 2012-02-20 ENCOUNTER — Other Ambulatory Visit (INDEPENDENT_AMBULATORY_CARE_PROVIDER_SITE_OTHER): Payer: Medicare Other

## 2012-02-20 ENCOUNTER — Telehealth: Payer: Self-pay | Admitting: *Deleted

## 2012-02-20 ENCOUNTER — Encounter: Payer: Self-pay | Admitting: *Deleted

## 2012-02-20 DIAGNOSIS — I4891 Unspecified atrial fibrillation: Secondary | ICD-10-CM

## 2012-02-20 LAB — HEPATIC FUNCTION PANEL
AST: 31 U/L (ref 0–37)
Alkaline Phosphatase: 102 U/L (ref 39–117)
Total Bilirubin: 0.4 mg/dL (ref 0.3–1.2)

## 2012-02-20 LAB — TSH: TSH: 2.31 u[IU]/mL (ref 0.35–5.50)

## 2012-02-20 MED ORDER — AMIODARONE HCL 400 MG PO TABS: ORAL_TABLET | ORAL | Status: DC

## 2012-02-20 NOTE — Telephone Encounter (Signed)
Addended by: Sherri Rad C on: 02/20/2012 09:04 AM   Modules accepted: Orders

## 2012-02-20 NOTE — Telephone Encounter (Signed)
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 Pt states he did take 5mg s on yesterday, thus instructed him to decrease dose to 5mg s QD except 7.5mg s on MWF. Recheck INR date moved forward.

## 2012-02-20 NOTE — Telephone Encounter (Signed)
I called and spoke with the patient this morning to verify his pharmacy. I will send his RX for amiodarone to [facility] on [address]. He will come today for baseline lab work. I will order his echo and he is aware that one of our schedulers will call him to set this up

## 2012-02-21 ENCOUNTER — Telehealth: Payer: Self-pay | Admitting: Internal Medicine

## 2012-02-21 NOTE — Telephone Encounter (Signed)
I spoke with the patient and his wife. The patient reports dizziness this morning when getting up. He feels like his HR is irregular, but under control. His BP reading was about 110 systolic this morning, which is typical for him. I explained to the patient that it will take a couple of days for the amiodarone to get in his system. I also explained that he may be noticing the dizziness as a side effect of his a-fib. I advised he should give the new medication a couple of days and he will hopefully notice that his a-fib is coming under control as he is transitioning between tikosyn and amiodarone. I also advised that he call our office back in a couple of days if he does not feel well. He voices understanding and is agreeable. His wife also reports that he has had elevated blood sugar since coming off of Tikosyn. I explained that amiodarone should not cause a significant rise in his blood sugar.

## 2012-02-21 NOTE — Telephone Encounter (Signed)
Please return call to patient wife [name] [phone]   Patient seems to be experiencing some side affects with first dose of new RX amiodarone (PACERONE) 400 MG tablet.  C/O elevated pulse, A-Fib, please return this call to patient wife.

## 2012-02-22 ENCOUNTER — Telehealth: Payer: Self-pay | Admitting: Internal Medicine

## 2012-02-22 NOTE — Telephone Encounter (Signed)
I clarified the amiodarone dose of amiodarone 400 mg twice daily for 2 weeks followed by 400 mg daily for 2 weeks and then 200 mg a day.

## 2012-02-22 NOTE — Telephone Encounter (Signed)
Pt was given Rx for amiodarone 200 mg BID but he got a letter in the mail wanting him to take 400mg  and he needs clarification

## 2012-02-29 ENCOUNTER — Telehealth: Payer: Self-pay | Admitting: Internal Medicine

## 2012-02-29 NOTE — Telephone Encounter (Signed)
I spoke with the patient and made him aware I sent Dr. Graciela Husbands a message this morning to see what he recommends regarding the patient's symptoms. I have advised I am waiting on a response from Dr. Graciela Husbands. He states he had heart burn symptoms that started last night and then this morning he was coughing and had a wheezing sound in his lungs. He tried some antacid tablets last night and they did not help with his reflux symptoms. I inquired as to how he feels his rate and rhythm has been. He states that up until today, he feels like he has had quite a bit of a-fib with rates in the 90's. Today he feels more regular, but reports rates around 114 bpm. I explained I will review with Dr. Graciela Husbands and call him back today. He is agreeable.

## 2012-02-29 NOTE — Telephone Encounter (Signed)
New Msg: Pt calling wanting to speak with nurse/MD regarding pt symptoms associated with amiodarone. Pt c/o coughing and wheezing as well as burning in the chest and pt thinks it might be associated with medication. Pt stated burning in the chest began two nights ago. Pt NOT c/o chest pain; stating pain feels similar to acid reflux. Please return pt call to discuss further. Please return pt call to discuss further.

## 2012-02-29 NOTE — Telephone Encounter (Signed)
I spoke with Dr. Graciela Husbands. He recommends seeing the patient in the office tomorrow to review his symptoms. The patient is aware and agreeable. We will make no changes to him meds today. He will come tomorrow at 4:15 pm to see Dr. Graciela Husbands.

## 2012-02-29 NOTE — Telephone Encounter (Signed)
New Problem:    Patient called back concerned that he has not received a call back yet. Please call back.

## 2012-03-01 ENCOUNTER — Ambulatory Visit (INDEPENDENT_AMBULATORY_CARE_PROVIDER_SITE_OTHER): Payer: Medicare Other | Admitting: Internal Medicine

## 2012-03-01 ENCOUNTER — Encounter: Payer: Self-pay | Admitting: Internal Medicine

## 2012-03-01 VITALS — BP 94/52 | HR 100 | Ht 74.0 in | Wt 196.4 lb

## 2012-03-01 DIAGNOSIS — I472 Ventricular tachycardia: Secondary | ICD-10-CM

## 2012-03-01 DIAGNOSIS — I4891 Unspecified atrial fibrillation: Secondary | ICD-10-CM

## 2012-03-01 NOTE — Assessment & Plan Note (Signed)
His atrial fibrillation has been more persistent since coming off of the dofetilide. He is on amiodarone and I think he is tolerating it okay. We will plan to undertake cardioversion in about 10 days' time with the hopes of restoring sinus rhythm.   His cough and wheezey sensation are improved. I do not think that they're related to the amiodarone

## 2012-03-01 NOTE — Progress Notes (Addendum)
HPI  [name] is a 78 y.o. male  seen in followup for paroxysmal atrial fibrillation for which he t Taken Tikosyn  Because of a new palpitation syndrome, we undertook a recorder. He ended up with an episode of nonsustained wide-complex tachycardia that was pause initiated at was  concerning to me as a possible proarrhythmic manifestation of his QT prolonging antiarrhythmic drug. Thus we stopped it after 5 years. We started amiodarone, which he tolerated initially and then it up with a cough and a wheezy sensation that also cleared with coughing. This has since resolved notwithstanding continuing his amiodarone.  He has had symptomatic tachypalpitations. And he takes Coumadin for thomboembolic risk reduction given his history of hypertension diabetes and age.  This was adjusted for his amiodarone  Past Medical History  Diagnosis Date  . Acid reflux disease     history of  . H/O: GI bleed   . Paroxysmal atrial fibrillation     chronic anticoag; tikosyn  . Benign prostatic hypertrophy     history of  . Hepatic damage march 2009    retained hepatic stone , intrahepatic duct catheter  . Diabetes mellitus     insulin dep  . CAD (coronary artery disease)   . Hypertension   . 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 Cardioversion   . Cholecystectomy     history of  . Partial gastrectomy     history of  . Shoulder surgery     left shoulder repair with 2 pens inserted  . Whipple procedure     operation  . 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 Inguinal hernia repair     inguinal herniorrhaphy, right... history of  . Appendectomy     history of    Current Outpatient Prescriptions  Medication Sig Dispense Refill  . amiodarone (PACERONE) 400 MG tablet Take one tablet by mouth twice daily x 2  weeks, then take one tablet by mouth daily x 2 weeks.  60 tablet  0  . Ascorbic Acid (VITAMIN C) 500 MG tablet Take 500 mg by mouth daily.        . carvedilol (COREG) 12.5 MG tablet Take 6.25 mg by mouth 2 (two) times daily with a meal.        . [facility]S GLUCOSAMINE-CHONDROITIN PO Take by mouth. 1200/600 mg       . Emollient (AMLACTIN XL) LOTN Apply lotion to legs once daily       . HUMULIN 70/30 70-30 % injection 12 units in the and 12 units in the pm      . Insulin Syringes, Disposable, (B-D INSULIN SYRINGE 1CC) U-100 1 ML MISC For use with insulin       . lisinopril (PRINIVIL,ZESTRIL) 10 MG tablet Take 1/2 tablet by mouth once daily  90 tablet  3  . Multiple Vitamins-Minerals (CENTRUM SILVER PO) Take by mouth daily.        . NON FORMULARY 1 capsule 2 (two) times daily. Citrical plus D      . Pancrelipase, Lip-Prot-Amyl, (CREON) 12000 UNITS CPEP 1 capsule 3 (three) times daily before meals.       . Tamsulosin HCl (FLOMAX) 0.4 MG CAPS Take 0.4 mg by mouth 2 (two) times daily.        Marland Kitchen  triamcinolone (KENALOG) 0.1 % cream Apply to left leg two times daily       . warfarin (COUMADIN) 5 MG tablet Take as directed by Coumadin clinic.  135 tablet  3    Allergies  Allergen Reactions  . Clindamycin   . Oxycodone-Acetaminophen   . Penicillins     REACTION: causes hives    Review of Systems negative except from HPI and PMH  Physical Exam BP 94/52  Pulse 100  Ht 6\' 2"  (1.88 m)  Wt 196 lb 6.4 oz (89.086 kg)  BMI 25.22 kg/m2 Well developed and well nourished in no acute distress HENT normal E scleral and icterus clear Neck Supple JVP flat; carotids brisk and full Clear to ausculation irregularly irregular And rapid rhythm, no murmurs gallops or rub Soft with active bowel sounds No clubbing cyanosis none Edema Alert and oriented, grossly normal motor and sensory function Skin Warm and Dry  ECG demonstrates atrial fibrillation at 100 beats per minute.  Assessment and  Plan

## 2012-03-02 ENCOUNTER — Encounter (HOSPITAL_COMMUNITY): Payer: Self-pay | Admitting: Respiratory Therapy

## 2012-03-02 ENCOUNTER — Ambulatory Visit (HOSPITAL_COMMUNITY): Payer: Medicare Other | Attending: Cardiology

## 2012-03-02 ENCOUNTER — Ambulatory Visit (INDEPENDENT_AMBULATORY_CARE_PROVIDER_SITE_OTHER): Payer: Medicare Other

## 2012-03-02 ENCOUNTER — Encounter: Payer: Self-pay | Admitting: *Deleted

## 2012-03-02 DIAGNOSIS — I10 Essential (primary) hypertension: Secondary | ICD-10-CM | POA: Insufficient documentation

## 2012-03-02 DIAGNOSIS — I059 Rheumatic mitral valve disease, unspecified: Secondary | ICD-10-CM | POA: Insufficient documentation

## 2012-03-02 DIAGNOSIS — I4891 Unspecified atrial fibrillation: Secondary | ICD-10-CM | POA: Insufficient documentation

## 2012-03-02 DIAGNOSIS — E119 Type 2 diabetes mellitus without complications: Secondary | ICD-10-CM | POA: Insufficient documentation

## 2012-03-02 DIAGNOSIS — I2510 Atherosclerotic heart disease of native coronary artery without angina pectoris: Secondary | ICD-10-CM | POA: Insufficient documentation

## 2012-03-02 LAB — POCT INR: INR: 3.9

## 2012-03-04 DIAGNOSIS — I472 Ventricular tachycardia: Secondary | ICD-10-CM | POA: Insufficient documentation

## 2012-03-04 NOTE — Assessment & Plan Note (Signed)
As above.

## 2012-03-05 NOTE — Progress Notes (Signed)
Addended by: Judithe Modest D on: 03/05/2012 11:03 AM   Modules accepted: Orders

## 2012-03-06 ENCOUNTER — Other Ambulatory Visit: Payer: Self-pay | Admitting: Internal Medicine

## 2012-03-06 DIAGNOSIS — I4891 Unspecified atrial fibrillation: Secondary | ICD-10-CM

## 2012-03-07 ENCOUNTER — Ambulatory Visit (HOSPITAL_COMMUNITY)
Admission: RE | Admit: 2012-03-07 | Discharge: 2012-03-07 | Disposition: A | Discharge status: Home / Self Care | Payer: Medicare Other | Source: Ambulatory Visit | Attending: Cardiology | Admitting: Cardiology

## 2012-03-07 ENCOUNTER — Encounter (HOSPITAL_COMMUNITY): Payer: Self-pay

## 2012-03-07 ENCOUNTER — Ambulatory Visit (INDEPENDENT_AMBULATORY_CARE_PROVIDER_SITE_OTHER): Payer: Medicare Other | Admitting: Pharmacist

## 2012-03-07 ENCOUNTER — Encounter (HOSPITAL_COMMUNITY)
Admission: RE | Disposition: A | Discharge status: Home / Self Care | Payer: Self-pay | Source: Ambulatory Visit | Attending: Cardiology

## 2012-03-07 ENCOUNTER — Ambulatory Visit (HOSPITAL_COMMUNITY): Payer: Medicare Other

## 2012-03-07 DIAGNOSIS — Z794 Long term (current) use of insulin: Secondary | ICD-10-CM | POA: Insufficient documentation

## 2012-03-07 DIAGNOSIS — R51 Headache: Secondary | ICD-10-CM | POA: Insufficient documentation

## 2012-03-07 DIAGNOSIS — I4891 Unspecified atrial fibrillation: Secondary | ICD-10-CM

## 2012-03-07 DIAGNOSIS — K219 Gastro-esophageal reflux disease without esophagitis: Secondary | ICD-10-CM | POA: Insufficient documentation

## 2012-03-07 DIAGNOSIS — E109 Type 1 diabetes mellitus without complications: Secondary | ICD-10-CM | POA: Insufficient documentation

## 2012-03-07 DIAGNOSIS — I10 Essential (primary) hypertension: Secondary | ICD-10-CM | POA: Insufficient documentation

## 2012-03-07 DIAGNOSIS — C001 Malignant neoplasm of external lower lip: Secondary | ICD-10-CM | POA: Insufficient documentation

## 2012-03-07 HISTORY — PX: CARDIOVERSION: SHX1299

## 2012-03-07 LAB — CBC
HCT: 34.1 % — ABNORMAL LOW (ref 39.0–52.0)
Hemoglobin: 11.4 g/dL — ABNORMAL LOW (ref 13.0–17.0)
MCH: 31.9 pg (ref 26.0–34.0)
MCHC: 33.4 g/dL (ref 30.0–36.0)
MCV: 95.5 fL (ref 78.0–100.0)
RDW: 13.3 % (ref 11.5–15.5)

## 2012-03-07 LAB — POCT INR: INR: 3.5

## 2012-03-07 LAB — BASIC METABOLIC PANEL
BUN: 18 mg/dL (ref 6–23)
Calcium: 8.5 mg/dL (ref 8.4–10.5)
Creatinine, Ser: 1.19 mg/dL (ref 0.50–1.35)
GFR calc non Af Amer: 54 mL/min — ABNORMAL LOW (ref >90)
Glucose, Bld: 118 mg/dL — ABNORMAL HIGH (ref 70–99)

## 2012-03-07 SURGERY — CARDIOVERSION
Anesthesia: Monitor Anesthesia Care | Wound class: Clean

## 2012-03-07 MED ORDER — SODIUM CHLORIDE 0.9 % IV SOLN: 250.0000 mL | INTRAVENOUS | Status: DC

## 2012-03-07 MED ORDER — SODIUM CHLORIDE 0.9 % IV SOLN: INTRAVENOUS | Status: DC

## 2012-03-07 MED ORDER — SODIUM CHLORIDE 0.9 % IV SOLN
INTRAVENOUS | Status: DC | PRN
  Administered 2012-03-07: 14:00:00 via INTRAVENOUS

## 2012-03-07 MED ORDER — SODIUM CHLORIDE 0.9 % IJ SOLN: 3.0000 mL | INTRAMUSCULAR | Status: DC | PRN

## 2012-03-07 MED ORDER — SODIUM CHLORIDE 0.9 % IJ SOLN: 3.0000 mL | Freq: Two times a day (BID) | INTRAMUSCULAR | Status: DC

## 2012-03-07 MED ORDER — HYDROCORTISONE 1 % EX CREA
1.0000 "application " | TOPICAL_CREAM | Freq: Three times a day (TID) | CUTANEOUS | Status: DC | PRN
  Filled 2012-03-07: qty 28

## 2012-03-07 MED ORDER — PROPOFOL 10 MG/ML IV EMUL
INTRAVENOUS | Status: DC | PRN
  Administered 2012-03-07: 100 mg via INTRAVENOUS

## 2012-03-07 MED ORDER — LIDOCAINE HCL (CARDIAC) 20 MG/ML IV SOLN
INTRAVENOUS | Status: DC | PRN
  Administered 2012-03-07: 50 mg via INTRAVENOUS

## 2012-03-07 NOTE — Anesthesia Preprocedure Evaluation (Addendum)
Anesthesia Evaluation  Patient identified by MRN, date of birth, ID band Patient awake    Reviewed: Allergy & Precautions, H&P , NPO status , Patient's Chart, lab work & pertinent test results, reviewed documented beta blocker date and time   Airway Mallampati: I TM Distance: >3 FB Neck ROM: full    Dental  (+) Dental Advidsory Given   Pulmonary          Cardiovascular hypertension, Pt. on medications + CAD + dysrhythmias Atrial Fibrillation Rhythm:irregular     Neuro/Psych  Headaches,    GI/Hepatic GERD-  Controlled and Medicated,  Endo/Other  Diabetes mellitus-, Well Controlled, Type 1, Insulin Dependent  Renal/GU      Musculoskeletal   Abdominal   Peds  Hematology   Anesthesia Other Findings   Reproductive/Obstetrics                          Anesthesia Physical Anesthesia Plan  ASA: III  Anesthesia Plan:    Post-op Pain Management:    Induction:   Airway Management Planned:   Additional Equipment:   Intra-op Plan:   Post-operative Plan:   Informed Consent:   Dental Advisory Given  Plan Discussed with: Anesthesiologist, CRNA and Surgeon  Anesthesia Plan Comments:         Anesthesia Quick Evaluation

## 2012-03-07 NOTE — Procedures (Signed)
Electrical Cardioversion Procedure Note [name] [id number] May 18, 1925  Procedure: Electrical Cardioversion Indications:  Atrial Fibrillation  Procedure Details Consent: Risks of procedure as well as the alternatives and risks of each were explained to the (patient/caregiver).  Consent for procedure obtained. Time Out: Verified patient identification, verified procedure, site/side was marked, verified correct patient position, special equipment/implants available, medications/allergies/relevent history reviewed, required imaging and test results available.  Performed  Patient placed on cardiac monitor, pulse oximetry, supplemental oxygen as necessary.  Sedation given: Diprovan 100 mg IV administered by anesthesia. Pacer pads placed anterior and posterior chest.  Cardioverted 1 time(s).  Cardioverted at 120J.  Evaluation Findings: Post procedure EKG shows: NSR Complications: None Patient did tolerate procedure well.   [name] 03/07/2012, 2:31 PM

## 2012-03-07 NOTE — Discharge Instructions (Signed)
General Anesthetic, Adult A doctor specialized in giving anesthesia (anesthesiologist) or a nurse specialized in giving anesthesia (nurse anesthetist) gives medicine that makes you sleep while a procedure is performed (general anesthetic). Once the general anesthetic has been administered, you will be in a sleeplike state in which you feel no pain. After having a general anestheticyou may feel:   Dizzy.   Weak.   Drowsy.   Confused.  These feelings are normal and can be expected to last for up to 24 hours after the procedure is completed.  LET YOUR CAREGIVER KNOW ABOUT:  Allergies you have.   Medications you are taking, including herbs, eye drops, over the counter medications, dietary supplements, and creams.   Previous problems you have had with anesthetics or numbing medicines.   Use of cigarettes, alcohol, or illicit drugs.   Possibility of pregnancy, if this applies.   History of bleeding or blood disorders, including blood clots and clotting disorders.   Previous surgeries you have had and types of anesthetics you have received.   Family medical history, especially anesthetic problems.   Other health problems.  BEFORE THE PROCEDURE  You may brush your teeth on the morning of surgery but you should have no solid food or non-clear liquids for a minimum of 8 hours prior to your procedure. Clear liquids (water, black coffee, and tea) are acceptable in small amounts until 2 hours prior to your procedure.   You may take your regular medications the morning of your procedure unless your caregiver indicates otherwise.  AFTER THE PROCEDURE  After surgery, you will be taken to the recovery area where a nurse will monitor your progress. You will be allowed to go home when you are awake, stable, taking fluids well, and without serious pain or complications.   For the first 24 hours following an anesthetic:   Have a responsible person with you.   Do not drive a car. If you are  alone, do not take public transportation.   Do not engage in strenuous activity. You may usually resume normal activities the next day, or as advised by your caregiver.   Do not drink alcohol.   Do not take medicine that has not been prescribed by your caregiver.   Do not sign important papers or make important decisions as your judgement may be impaired.   You may resume a normal diet as directed.   Change bandages (dressings) as directed.   Only take over-the-counter or prescription medicines for pain, discomfort, or fever as directed by your caregiver.  If you have questions or problems that seem related to the anesthetic, call the hospital and ask for the anesthetist, anesthesiologist, or anesthesia department. SEEK IMMEDIATE MEDICAL CARE IF:   You develop a rash.   You have difficulty breathing.   You have chest pain.   You have allergic problems.   You have uncontrolled nausea.   You have uncontrolled vomiting.   You develop any serious bleeding, especially from the incision site.  Document Released: 02/21/2008 Document Revised: 11/03/2011 Document Reviewed: 03/17/2011 Exit[employer] Patient Information 2012 [employer]. 

## 2012-03-07 NOTE — Preoperative (Signed)
Beta Blockers   Reason not to administer Beta Blockers:Not Applicable 

## 2012-03-07 NOTE — Anesthesia Postprocedure Evaluation (Signed)
  Anesthesia Post-op Note  Patient: [name]  Procedure(s) Performed: Procedure(s) (LRB): CARDIOVERSION (N/A)  Patient Location: Short Stay  Anesthesia Type: MAC  Level of Consciousness: awake, alert  and oriented  Airway and Oxygen Therapy: Patient Spontanous Breathing and Patient connected to nasal cannula oxygen  Post-op Pain: none  Post-op Assessment: Post-op Vital signs reviewed, Patient's Cardiovascular Status Stable, Respiratory Function Stable, Patent Airway and No signs of Nausea or vomiting  Post-op Vital Signs: Reviewed and stable  Complications: No apparent anesthesia complications

## 2012-03-07 NOTE — Transfer of Care (Signed)
Immediate Anesthesia Transfer of Care Note  Patient: [name]  Procedure(s) Performed: Procedure(s) (LRB): CARDIOVERSION (N/A)  Patient Location: PACU and Short Stay  Anesthesia Type: MAC  Level of Consciousness: awake, alert  and oriented  Airway & Oxygen Therapy: Patient Spontanous Breathing and Patient connected to nasal cannula oxygen  Post-op Assessment: Report given to PACU RN, Post -op Vital signs reviewed and stable and Patient moving all extremities  Post vital signs: Reviewed and stable  Complications: No apparent anesthesia complications

## 2012-03-08 ENCOUNTER — Encounter (HOSPITAL_COMMUNITY): Payer: Self-pay | Admitting: Internal Medicine

## 2012-03-09 ENCOUNTER — Ambulatory Visit: Payer: Medicare Other | Admitting: Internal Medicine

## 2012-03-09 ENCOUNTER — Other Ambulatory Visit (HOSPITAL_COMMUNITY): Payer: Medicare Other

## 2012-03-12 MED ORDER — AMIODARONE HCL 200 MG PO TABS: 200.0000 mg | ORAL_TABLET | Freq: Every day | ORAL | Status: DC

## 2012-03-12 NOTE — Telephone Encounter (Signed)
Spoke with pt, script for amiodarone sent to pharm.

## 2012-03-12 NOTE — Telephone Encounter (Signed)
Fu call Pt was calling back about this med. He wants to tell you how it is doing it.

## 2012-03-22 ENCOUNTER — Encounter: Payer: Self-pay | Admitting: Physician Assistant

## 2012-03-22 ENCOUNTER — Ambulatory Visit (INDEPENDENT_AMBULATORY_CARE_PROVIDER_SITE_OTHER): Payer: Medicare Other | Admitting: *Deleted

## 2012-03-22 ENCOUNTER — Ambulatory Visit (INDEPENDENT_AMBULATORY_CARE_PROVIDER_SITE_OTHER): Payer: Medicare Other | Admitting: Physician Assistant

## 2012-03-22 VITALS — BP 112/56 | HR 59 | Ht 71.0 in | Wt 194.0 lb

## 2012-03-22 DIAGNOSIS — I4891 Unspecified atrial fibrillation: Secondary | ICD-10-CM

## 2012-03-22 NOTE — Progress Notes (Signed)
[address] [place], [place]  [address] Phone: [phone] Fax:  [phone]  Date:  03/22/2012   Name:  [name]       DOB:  28-Nov-1925 MRN:  [id number]  PCP:  Illene Regulus, MD, MD  Primary Cardiologist/Electrophysiologist:  Dr. Sherryl Manges    History of Present Illness: [name] is a 78 y.o. male who presents for follow up.  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  He was previously on dofetilide.  He had nonsustained ventricular tachycardia on a monitor.  Because of the concern of pro-arrhythmic effect, dofetilide was discontinued.  Amiodarone was started.  Echocardiogram 03/10/12: EF 50%, mild MR, aortic sclerosis without stenosis, inferior and inferolateral hypokinesis, mild to moderate LAE.  He was last seen by Dr. Graciela Husbands 03/08/12.  Cardioversion was arranged for/10/13 and performed by Dr. Jens Som.  He converted to sinus rhythm after one shock. Overall doing well.  Had some congestion and cough initially when amio started that has since resolved.  No chest pain.  No syncope.  No orthopnea, PND, edema.  No palpitations.    Past Medical History  Diagnosis Date  . Acid reflux disease     history of  . H/O: GI bleed   . Paroxysmal atrial fibrillation     chronic anticoag; tikosyn  . Benign prostatic hypertrophy     history of  . Hepatic damage march 2009    retained hepatic stone , intrahepatic duct catheter  . Diabetes mellitus     insulin dep  . CAD (coronary artery disease)   . Hypertension   . 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 amiodarone (PACERONE) 200 MG tablet Take 1 tablet (200 mg total) by mouth daily.  90 tablet  3  .  Ascorbic Acid (VITAMIN C) 500 MG tablet Take 500 mg by mouth daily.        . carvedilol (COREG) 12.5 MG tablet Take 6.25 mg by mouth 2 (two) times daily with a meal.        . CVS GLUCOSAMINE-CHONDROITIN PO Take 1 tablet by mouth daily. 1200/600 mg      . Emollient (AMLACTIN XL) LOTN Apply 1 application topically daily. Apply lotion to legs      . HUMULIN 70/30 70-30 % injection Inject 12 Units into the skin 2 (two) times daily with a meal.       . Insulin Syringes, Disposable, (B-D INSULIN SYRINGE 1CC) U-100 1 ML MISC For use with insulin       . lisinopril (PRINIVIL,ZESTRIL) 10 MG tablet Take 5 mg by mouth daily.   90 tablet  3  . Multiple Vitamins-Minerals (CENTRUM SILVER PO) Take 1 tablet by mouth daily.       . Pancrelipase, Lip-Prot-Amyl, (CREON) 12000 UNITS CPEP Take 1 capsule by mouth 3 (three) times daily before meals.       . Tamsulosin HCl (FLOMAX) 0.4 MG CAPS Take 0.4 mg by mouth 2 (two) times daily.       Marland Kitchen triamcinolone (KENALOG) 0.1 % cream Apply 1 application topically 2 (two) times daily. Apply to left leg      . warfarin (COUMADIN) 5 MG tablet  Take 5-7.5 mg by mouth daily. Take 7.5mg  on Monday, Wednesday and Friday. Take 5mg  the rest of the week.        Allergies: Allergies  Allergen Reactions  . Clindamycin   . Oxycodone-Acetaminophen   . Penicillins     REACTION: causes hives    History  Substance Use Topics  . Smoking status: Former Smoker    Types: Pipe    Quit date: 03/14/1991  . Smokeless tobacco: Never Used  . Alcohol Use: No     PHYSICAL EXAM: VS:  BP 112/56  Pulse 59  Ht 5\' 11"  (1.803 m)  Wt 194 lb (87.998 kg)  BMI 27.06 kg/m2 Well nourished, well developed, in no acute distress HEENT: normal Neck: no JVD Cardiac:  normal S1, S2; RRR; no murmur Lungs:  clear to auscultation bilaterally, no wheezing, rhonchi or rales Abd: soft, nontender, no hepatomegaly Ext: no edema Skin: warm and dry Neuro:  CNs 2-12 intact, no focal abnormalities  noted  EKG:  Sinus rhythm, heart rate 57, left axis, nonspecific ST-T wave changes, no change from prior tracing  ASSESSMENT AND PLAN:  1.  Persistent atrial fibrillation Maintaining sinus rhythm status post DCCV.  He remains on amiodarone and Coumadin.  He is mildly bradycardic.  He is not symptomatic with this.  He had several questions regarding pacemaker implantation.  I tried to answer all of his questions.  He has follow up already with Dr. Graciela Husbands May 30.  He will keep this appointment.   Signed, Tereso Newcomer, PA-C  2:07 PM 03/22/2012

## 2012-03-22 NOTE — Patient Instructions (Signed)
Your physician recommends that you schedule a follow-up appointment as scheduled with Dr Graciela Husbands

## 2012-03-25 ENCOUNTER — Encounter: Payer: Self-pay | Admitting: Internal Medicine

## 2012-03-27 ENCOUNTER — Other Ambulatory Visit: Payer: Self-pay

## 2012-03-27 MED ORDER — INSULIN NPH ISOPHANE & REGULAR (70-30) 100 UNIT/ML ~~LOC~~ SUSP: 12.0000 [IU] | Freq: Two times a day (BID) | SUBCUTANEOUS | Status: DC

## 2012-03-27 MED ORDER — INSULIN SYRINGES (DISPOSABLE) U-100 1 ML MISC: 1.0000 | Freq: Two times a day (BID) | Status: DC

## 2012-03-29 ENCOUNTER — Other Ambulatory Visit: Payer: Self-pay | Admitting: *Deleted

## 2012-03-29 MED ORDER — TAMSULOSIN HCL 0.4 MG PO CAPS: 0.4000 mg | ORAL_CAPSULE | Freq: Two times a day (BID) | ORAL | Status: DC

## 2012-04-17 ENCOUNTER — Ambulatory Visit (INDEPENDENT_AMBULATORY_CARE_PROVIDER_SITE_OTHER): Payer: Medicare Other | Admitting: Pharmacist

## 2012-04-17 DIAGNOSIS — I4891 Unspecified atrial fibrillation: Secondary | ICD-10-CM

## 2012-04-26 ENCOUNTER — Ambulatory Visit (INDEPENDENT_AMBULATORY_CARE_PROVIDER_SITE_OTHER): Payer: Medicare Other | Admitting: Internal Medicine

## 2012-04-26 ENCOUNTER — Encounter: Payer: Self-pay | Admitting: Internal Medicine

## 2012-04-26 VITALS — BP 120/65 | HR 54 | Ht 74.0 in | Wt 192.1 lb

## 2012-04-26 DIAGNOSIS — I4891 Unspecified atrial fibrillation: Secondary | ICD-10-CM

## 2012-04-26 DIAGNOSIS — E119 Type 2 diabetes mellitus without complications: Secondary | ICD-10-CM

## 2012-04-26 DIAGNOSIS — R42 Dizziness and giddiness: Secondary | ICD-10-CM

## 2012-04-26 LAB — HEPATIC FUNCTION PANEL
Albumin: 3.7 g/dL (ref 3.5–5.2)
Alkaline Phosphatase: 106 U/L (ref 39–117)
Total Protein: 6.8 g/dL (ref 6.0–8.3)

## 2012-04-26 LAB — BASIC METABOLIC PANEL
BUN: 24 mg/dL — ABNORMAL HIGH (ref 6–23)
CO2: 29 mEq/L (ref 19–32)
Calcium: 8.8 mg/dL (ref 8.4–10.5)
Creatinine, Ser: 1.2 mg/dL (ref 0.4–1.5)
Glucose, Bld: 149 mg/dL — ABNORMAL HIGH (ref 70–99)

## 2012-04-26 LAB — TSH: TSH: 4.23 u[IU]/mL (ref 0.35–5.50)

## 2012-04-26 MED ORDER — LISINOPRIL 2.5 MG PO TABS: 2.5000 mg | ORAL_TABLET | Freq: Every day | ORAL | Status: DC

## 2012-04-26 MED ORDER — CARVEDILOL 3.125 MG PO TABS: 3.1250 mg | ORAL_TABLET | Freq: Two times a day (BID) | ORAL | Status: DC

## 2012-04-26 NOTE — Assessment & Plan Note (Signed)
He is requiring less insulin. We will measure his creatinine

## 2012-04-26 NOTE — Assessment & Plan Note (Signed)
Currently on amiodarone and warfarin. We will check amiodarone surveillance laboratories toDAY

## 2012-04-26 NOTE — Progress Notes (Signed)
HPI  [name] is a 78 y.o. male seen in followup for paroxysmal atrial fibrillation for which he  Taken Tikosyn  Because of a new palpitation syndrome, we undertook a recorder. He ended up with an episode of nonsustained wide-complex tachycardia that was pause initiated at was concerning to me as a possible proarrhythmic manifestation of his QT prolonging antiarrhythmic drug. Thus we stopped it after 5 years. We started amiodarone, which he tolerated initially and then it up with a cough and a wheezy sensation that also cleared with coughing. This has since resolved notwithstanding continuing his amiodarone.  We undertook cardioversion and he is holding sinus rhythm. His exercise tolerance is somewhat better. There've been no significant palpitations. He has noted some degree of aggravation of orthostatic intolerance. He has had no edema.   Past Medical History  Diagnosis Date  . Acid reflux disease     history of  . H/O: GI bleed   . Paroxysmal atrial fibrillation     chronic anticoag; tikosyn  . Benign prostatic hypertrophy     history of  . Hepatic damage march 2009    retained hepatic stone , intrahepatic duct catheter  . Diabetes mellitus     insulin dep  . CAD (coronary artery disease)   . Hypertension   . 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 Cardioversion   . Cholecystectomy     history of  . Partial gastrectomy     history of  . Shoulder surgery     left shoulder repair with 2 pens inserted  . Whipple procedure     operation  . 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 Inguinal hernia repair     inguinal herniorrhaphy, right... history of  . Appendectomy     history of  . Cardioversion 03/07/2012    Procedure: CARDIOVERSION;  Surgeon: Duke Salvia, MD;  Location: [facility] OR;   Service: Cardiovascular;  Laterality: N/A;    Current Outpatient Prescriptions  Medication Sig Dispense Refill  . amiodarone (PACERONE) 200 MG tablet Take 1 tablet (200 mg total) by mouth daily.  90 tablet  3  . Ascorbic Acid (VITAMIN C) 500 MG tablet Take 500 mg by mouth daily.        . carvedilol (COREG) 12.5 MG tablet Take 6.25 mg by mouth 2 (two) times daily with a meal.        . CVS GLUCOSAMINE-CHONDROITIN PO Take 1 tablet by mouth daily. 1200/600 mg      . Emollient (AMLACTIN XL) LOTN Apply 1 application topically daily. Apply lotion to legs      . insulin NPH-insulin regular (HUMULIN 70/30) (70-30) 100 UNIT/ML injection Inject 12 Units into the skin 2 (two) times daily with a meal.  30 mL  0  . Insulin Syringes, Disposable, (B-D INSULIN SYRINGE 1CC) U-100 1 ML MISC 1 each by Other route 2 (two) times daily. For use with insulin  100 each  0  . lisinopril (PRINIVIL,ZESTRIL) 10 MG tablet Take 5 mg by mouth daily.   90 tablet  3  . Multiple Vitamins-Minerals (CENTRUM SILVER PO) Take 1 tablet by mouth daily.       . Pancrelipase, Lip-Prot-Amyl, (CREON) 12000 UNITS CPEP Take 1 capsule by mouth as directed. 5 capsules  per day (1 or 2/meal)      . Tamsulosin HCl (FLOMAX) 0.4 MG CAPS Take 1 capsule (0.4 mg total) by mouth 2 (two) times daily.  180 capsule  0  . triamcinolone (KENALOG) 0.1 % cream Apply 1 application topically 2 (two) times daily. Apply to left leg      . warfarin (COUMADIN) 5 MG tablet Take 5-7.5 mg by mouth daily. Take 7.5mg  on Monday, Wednesday and Friday. Take 5mg  the rest of the week.        Allergies  Allergen Reactions  . Clindamycin   . Oxycodone-Acetaminophen   . Penicillins     REACTION: causes hives    Review of Systems negative except from HPI and PMH  Physical Exam BP 120/65  Pulse 54  Ht 6\' 2"  (1.88 m)  Wt 192 lb 1.9 oz (87.145 kg)  BMI 24.67 kg/m2 Well developed and well nourished in no acute distress HENT normal E scleral and icterus clear Neck  Supple JVP flat; carotids brisk and full Clear to ausculation SLOW Regular rate and rhythm, no murmurs gallops or rub Soft with active bowel sounds No clubbing cyanosis none Edema Alert and oriented, grossly normal motor and sensory function Skin Warm and Dry Frozen shoulder and elbow   Assessment and  Plan

## 2012-04-26 NOTE — Patient Instructions (Signed)
Your physician has recommended you make the following change in your medication:  1) Decrease coreg (carvedilol) to 3.125 mg one tablet by mouth twice daily. 2) Decrease lisinopril to 2.5 mg one tablet by mouth once daily.  Your physician recommends that you have lab work today: bmp/liver/tsh  Your physician recommends that you schedule a follow-up appointment in: 3-4 months.

## 2012-04-26 NOTE — Assessment & Plan Note (Signed)
Dizziness is more of a problem. We will reduce his blood pressure medications. Amiodarone can rarely be associated with autonomic neuropathy

## 2012-05-01 NOTE — Progress Notes (Signed)
Addended by: Reine Just on: 05/01/2012 08:17 PM   Modules accepted: Orders

## 2012-05-16 ENCOUNTER — Ambulatory Visit (INDEPENDENT_AMBULATORY_CARE_PROVIDER_SITE_OTHER): Payer: Medicare Other | Admitting: Pharmacist

## 2012-05-16 DIAGNOSIS — I4891 Unspecified atrial fibrillation: Secondary | ICD-10-CM

## 2012-05-16 LAB — POCT INR: INR: 2.6

## 2012-06-11 ENCOUNTER — Other Ambulatory Visit (INDEPENDENT_AMBULATORY_CARE_PROVIDER_SITE_OTHER): Payer: Medicare Other

## 2012-06-11 ENCOUNTER — Encounter: Payer: Self-pay | Admitting: Internal Medicine

## 2012-06-11 ENCOUNTER — Ambulatory Visit (INDEPENDENT_AMBULATORY_CARE_PROVIDER_SITE_OTHER): Payer: Medicare Other | Admitting: Internal Medicine

## 2012-06-11 VITALS — BP 120/68 | HR 68 | Temp 97.0°F | Resp 16 | Wt 190.0 lb

## 2012-06-11 DIAGNOSIS — K922 Gastrointestinal hemorrhage, unspecified: Secondary | ICD-10-CM

## 2012-06-11 DIAGNOSIS — Z0000 Encounter for general adult medical examination without abnormal findings: Secondary | ICD-10-CM

## 2012-06-11 DIAGNOSIS — R51 Headache: Secondary | ICD-10-CM

## 2012-06-11 DIAGNOSIS — E119 Type 2 diabetes mellitus without complications: Secondary | ICD-10-CM

## 2012-06-11 DIAGNOSIS — I4891 Unspecified atrial fibrillation: Secondary | ICD-10-CM

## 2012-06-11 DIAGNOSIS — R42 Dizziness and giddiness: Secondary | ICD-10-CM

## 2012-06-11 LAB — CBC WITH DIFFERENTIAL/PLATELET
Basophils Absolute: 0 10*3/uL (ref 0.0–0.1)
Basophils Relative: 0.4 % (ref 0.0–3.0)
Eosinophils Absolute: 0.5 10*3/uL (ref 0.0–0.7)
Lymphocytes Relative: 15.4 % (ref 12.0–46.0)
MCHC: 33 g/dL (ref 30.0–36.0)
Monocytes Absolute: 0.9 10*3/uL (ref 0.1–1.0)
Neutrophils Relative %: 63.9 % (ref 43.0–77.0)
Platelets: 160 10*3/uL (ref 150.0–400.0)
RBC: 3.87 Mil/uL — ABNORMAL LOW (ref 4.22–5.81)

## 2012-06-11 MED ORDER — FINASTERIDE 5 MG PO TABS: 5.0000 mg | ORAL_TABLET | Freq: Every day | ORAL | Status: DC

## 2012-06-11 MED ORDER — WARFARIN SODIUM 5 MG PO TABS: 5.0000 mg | ORAL_TABLET | Freq: Every day | ORAL | Status: DC

## 2012-06-11 MED ORDER — INSULIN SYRINGES (DISPOSABLE) U-100 1 ML MISC: 1.0000 | Freq: Two times a day (BID) | Status: DC

## 2012-06-11 MED ORDER — PANCRELIPASE (LIP-PROT-AMYL) 12000-38000 UNITS PO CPEP: 3.0000 | ORAL_CAPSULE | ORAL | Status: DC

## 2012-06-11 MED ORDER — TAMSULOSIN HCL 0.4 MG PO CAPS: 0.4000 mg | ORAL_CAPSULE | Freq: Two times a day (BID) | ORAL | Status: DC

## 2012-06-11 NOTE — Progress Notes (Signed)
Subjective:    Patient ID: [name], male    DOB: [birth year], 78 y.o.   MRN: [id number]  HPI [name] is here for annual Medicare wellness examination and management of other chronic and acute problems.  He has continued problems with prostatism with intermittancy, frequency, nocturia. He is on flomax. He reports that he failed proscar in the past.   In the interval he has had recurrent a. Fib. He was taken off of tikosyn and started on amiodarone and has had good rate control. He has been seen on a regular basis by Dr. Graciela Husbands.  He has had good control of diabetes with CBG in the 100-155 range. Last A1C 7.3% June '12. He has not had any hypoglycemic events.  He does complain of positional vertigo: duration of 30 seconds or less. No syncope, no falls   He has some drainage from his eyes - he has a yellow d/c but no matting, no pain and no change in vision.  Last August he sustained a fracture of the distal right humerus. He is still in PT. He can now eat and drive but still has considerable decrease in function. He follows with Dr. Dion Saucier.   The risk factors are reflected in the social history.  The roster of all physicians providing medical care to patient - is listed in the Snapshot section of the chart.  Activities of daily living:  The patient is 100% inedpendent in all ADLs: dressing, toileting, feeding as well as independent mobility  Home safety : The patient has smoke detectors in the home. Fall- home is fall safe.Has grab bars in the bathroom and a walk-in bathtub. They wear seatbelts.  firearms are present in the home, kept in a safe fashion. There is no violence in the home.   There is no risks for hepatitis, STDs or HIV. There is no   history of blood transfusion. They have no travel history to infectious disease endemic areas of the world.  The patient has seen their dentist in the last six month. They have seen their eye doctor in the last year. They deny any hearing  difficulty and have not had audiologic testing in the last year.  They do not  have excessive sun exposure. Discussed the need for sun protection: hats, long sleeves and use of sunscreen if there is significant sun exposure.   Diet: the importance of a healthy diet is discussed. They do have a healthy diet.  The patient has a regular exercise program: walking , 20-30 min duration,  5 per week.  The benefits of regular aerobic exercise were discussed.  Depression screen: there are no signs or vegative symptoms of depression- irritability, change in appetite, anhedonia, sadness/tearfullness.  Cognitive assessment: the patient manages all their financial and personal affairs and is actively engaged.   The following portions of the patient's history were reviewed and updated as appropriate: allergies, current medications, past family history, past medical history,  past surgical history, past social history  and problem list.  Vision, hearing, body mass index were assessed and reviewed.   During the course of the visit the patient was educated and counseled about appropriate screening and preventive services including : fall prevention , diabetes screening, nutrition counseling, colorectal cancer screening, and recommended immunizations.  Past Medical History  Diagnosis Date  . Acid reflux disease     history of  . H/O: GI bleed   . Paroxysmal atrial fibrillation     chronic anticoag; tikosyn  .  Benign prostatic hypertrophy     history of  . Hepatic damage march 2009    retained hepatic stone , intrahepatic duct catheter  . Diabetes mellitus     insulin dep  . CAD (coronary artery disease)   . Hypertension   . 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 Cardioversion   . Cholecystectomy     history of  .  Partial gastrectomy     history of  . Shoulder surgery     left shoulder repair with 2 pens inserted  . Whipple procedure     operation  . 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 Inguinal hernia repair     inguinal herniorrhaphy, right... history of  . Appendectomy     history of  . Cardioversion 03/07/2012    Procedure: CARDIOVERSION;  Surgeon: Duke Salvia, MD;  Location: [facility] OR;  Service: Cardiovascular;  Laterality: N/A;   Family History  Problem Relation Age of Onset  . Coronary artery disease Father 40  . Heart disease Father     fatal MI  . Osteoarthritis Mother 61  . Arthritis Mother   . Coronary artery disease Brother     cabg, avr, pvd with sents  . Heart disease Brother     CABG, PVD  . Peripheral vascular disease Sister 47  . Fibromyalgia Sister   . Arthritis Sister   . Coronary artery disease Sister   . Diabetes Sister   . Cancer Brother    History   Social History  . Marital Status: Married    Spouse Name: N/A    Number of Children: 3  . Years of Education: N/A   Occupational History  . 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     Social History Main Topics  . Smoking status: Former Smoker    Types: Pipe    Quit date: 03/14/1991  . Smokeless tobacco: Never Used  . Alcohol Use: No  . Drug Use: No  . Sexually Active: Not Currently   Other Topics Concern  . Not on file   Social History Narrative   [employer] -Counselling psychologist. married 1954. 2 sons - 1956, 39; 1 daughter 51. 7 grandchildren. retired- Runner, broadcasting/film/video.  End of Life Care: DNR, no prolonged intubation (more than 2-3 days), no sustaining tube feeding, no futile or heroic measures. (Provided signed Out of Facility order and unsigned MOST form 6/112/12)    Current Outpatient Prescriptions on File Prior to Visit  Medication Sig Dispense Refill  . amiodarone (PACERONE) 200 MG tablet Take 1 tablet (200 mg total) by mouth daily.  90 tablet  3  . Ascorbic Acid (VITAMIN C) 500 MG  tablet Take 500 mg by mouth daily.        . carvedilol (COREG) 3.125 MG tablet Take 1 tablet (3.125 mg total) by mouth 2 (two) times daily with a meal.  180 tablet  3  . CVS GLUCOSAMINE-CHONDROITIN PO Take 1 tablet by mouth daily. 1200/600 mg      . Emollient (AMLACTIN XL) LOTN Apply 1 application topically daily. Apply lotion to legs      . lisinopril (PRINIVIL,ZESTRIL) 2.5 MG tablet Take 1 tablet (2.5 mg total) by mouth daily.  90 tablet  3  .  Multiple Vitamins-Minerals (CENTRUM SILVER PO) Take 1 tablet by mouth daily.       Marland Kitchen triamcinolone (KENALOG) 0.1 % cream Apply 1 application topically 2 (two) times daily. Apply to left leg      . DISCONTD: Pancrelipase, Lip-Prot-Amyl, (CREON) 12000 UNITS CPEP Take 1 capsule by mouth as directed. 5 capsules per day (1 or 2/meal)      . DISCONTD: Tamsulosin HCl (FLOMAX) 0.4 MG CAPS Take 1 capsule (0.4 mg total) by mouth 2 (two) times daily.  180 capsule  0     Review of Systems Constitutional:  Negative for fever, chills, activity change and unexpected weight change.  HEENT:  Negative for hearing loss, ear pain, congestion, neck stiffness and postnasal drip. Negative for sore throat or swallowing problems. Negative for dental complaints.   Eyes: Negative for vision loss or change in visual acuity.  Respiratory: Negative for chest tightness and wheezing. Negative for DOE.   Cardiovascular: Negative for chest pain or palpitations. No decreased exercise tolerance Gastrointestinal: No change in bowel habit. No bloating or gas. No reflux or indigestion Genitourinary: Positive for urgency, frequency, flank pain and difficulty urinating.  Musculoskeletal: Negative for myalgias, back pain, arthralgias and gait problem.  Neurological: Positive for dizziness/positional vertigo, No tremors, weakness and headaches.  Hematological: Negative for adenopathy.  Psychiatric/Behavioral: Negative for behavioral problems and dysphoric mood.       Objective:   Physical  Exam Filed Vitals:   06/11/12 0915  BP: 120/68  Pulse: 68  Temp: 97 F (36.1 C)  Resp: 16   Wt Readings from Last 3 Encounters:  06/11/12 190 lb (86.183 kg)  04/26/12 192 lb 1.9 oz (87.145 kg)  03/22/12 194 lb (87.998 kg)     Gen'l: Well nourished well developed white male in no acute distress  HEENT: Head: Normocephalic and atraumatic. Right Ear: External ear normal. EAC/TM nl. Left Ear: External ear normal.  EAC/TM nl. Nose: Nose normal. Mouth/Throat: Oropharynx is clear and moist. Dentition - native, in good repair. No buccal or palatal lesions. Posterior pharynx clear. Eyes: Conjunctivae and sclera clear. EOM intact. Pupils are equal, round, and reactive to light. Right eye exhibits no discharge. Left eye exhibits no discharge. Neck: Normal range of motion. Neck supple. No JVD present. No tracheal deviation present. No thyromegaly present.  Cardiovascular: Normal rate, regular rhythm occasional PVCs, no gallop, no friction rub, no murmur heard.      Quiet precordium. 2+ radial . No carotid bruits Pulmonary/Chest: Effort normal. No respiratory distress or increased WOB, no wheezes, no rales. No chest wall deformity or CVAT. Abdominal: Soft. Bowel sounds are normal in all quadrants. He exhibits no distension, no tenderness, no rebound or guarding, No heptosplenomegaly  Genitourinary: deferred   Musculoskeletal: Normal range of motion except right proximal UE with decreased flexion and rotation. He exhibits no edema and no tenderness.       Small and large joints without redness, synovial thickening or deformity. Full range of motion preserved about all small, median and large joints except right elbow.  Lymphadenopathy:    He has no cervical or supraclavicular adenopathy.  Neurological: He is alert and oriented to person, place, and time. CN II-XII intact.Cerebellar function normal with no tremor, rigidity, normal gait and station.  Skin: Skin is warm and dry. No rash noted. No  erythema.  Psychiatric: He has a normal mood and affect. His behavior is normal. Thought content normal.   Lab Results  Component Value Date   WBC 6.9 06/11/2012  HGB 12.5* 06/11/2012   HCT 37.9* 06/11/2012   PLT 160.0 06/11/2012   GLUCOSE 149* 04/26/2012   CHOL 178 01/04/2010   TRIG 61.0 01/04/2010   HDL 46.40 01/04/2010   LDLCALC 119* 01/04/2010   ALT 29 04/26/2012   AST 29 04/26/2012   NA 140 04/26/2012   K 4.7 04/26/2012   CL 106 04/26/2012   CREATININE 1.2 04/26/2012   BUN 24* 04/26/2012   CO2 29 04/26/2012   TSH 4.23 04/26/2012   PSA 2.28 01/04/2010   INR 2.6 05/16/2012   HGBA1C 7.1* 06/11/2012           Assessment & Plan:

## 2012-06-12 DIAGNOSIS — Z0000 Encounter for general adult medical examination without abnormal findings: Secondary | ICD-10-CM | POA: Insufficient documentation

## 2012-06-12 NOTE — Assessment & Plan Note (Signed)
Interval history is without new problems or events. Physical exam is normal. Previous labs and new labs are in normal limits. He is not in need of either colon or prostate cancer screening. Immuizations - due for pneumonia vaccine and can consider shingles vaccine.   In summary - a very pleasant man who has done well with a significant disease burden. He is medically stable and a good candidate for congregate living such. He will return as needed or in 6 months. If he moves to [facility] he may find the [facility] office offers more convenience for on-going primary care.

## 2012-06-12 NOTE — Assessment & Plan Note (Signed)
Continues to do well - reduced headaches attributed to daily sinus wash with normal saline.

## 2012-06-12 NOTE — Assessment & Plan Note (Signed)
Appears to be stable, holding a regular rhythm and tolerating amiodarone with few if any side effects.  Plan  Per Dr. Graciela Husbands.

## 2012-06-12 NOTE — Assessment & Plan Note (Signed)
Home CBG record reviewed - a slight trend toward increased readings.  A1C 7.1 % is Excellent.  Plan - continue present regimen

## 2012-06-12 NOTE — Assessment & Plan Note (Signed)
Patient with classic symptoms of positional vertigo. Reviewed with him the mechanism involved, including cartoon.  Plan  "rule of 20," to count to 20 with position change in anticipation of transient dizziness in order to prevent falls.

## 2012-06-27 ENCOUNTER — Ambulatory Visit (INDEPENDENT_AMBULATORY_CARE_PROVIDER_SITE_OTHER): Payer: Medicare Other | Admitting: *Deleted

## 2012-06-27 DIAGNOSIS — I4891 Unspecified atrial fibrillation: Secondary | ICD-10-CM

## 2012-06-27 LAB — POCT INR: INR: 2.3

## 2012-06-28 ENCOUNTER — Other Ambulatory Visit: Payer: Self-pay | Admitting: Dermatology

## 2012-07-27 ENCOUNTER — Encounter: Payer: Self-pay | Admitting: Internal Medicine

## 2012-07-27 ENCOUNTER — Ambulatory Visit (INDEPENDENT_AMBULATORY_CARE_PROVIDER_SITE_OTHER): Payer: Medicare Other | Admitting: Internal Medicine

## 2012-07-27 VITALS — BP 102/54 | HR 59 | Ht 74.0 in | Wt 192.0 lb

## 2012-07-27 DIAGNOSIS — I472 Ventricular tachycardia, unspecified: Secondary | ICD-10-CM

## 2012-07-27 DIAGNOSIS — I4891 Unspecified atrial fibrillation: Secondary | ICD-10-CM

## 2012-07-27 DIAGNOSIS — R42 Dizziness and giddiness: Secondary | ICD-10-CM

## 2012-07-27 NOTE — Progress Notes (Signed)
Patient Care Team: Jacques Navy, MD as PCP - General Duke Salvia, MD as Consulting Physician (Cardiology) Eulas Post, MD as Consulting Physician (Orthopedic Surgery)   HPI  [name] is a 78 y.o. male seen in followup for paroxysmal atrial fibrillation for which he  Taken Tikosyn  Because of a new palpitation syndrome, we undertook a recorder. He ended up with an episode of nonsustained wide-complex tachycardia that was pause initiated at was concerning to me as a possible proarrhythmic manifestation of his QT prolonging antiarrhythmic drug. Thus we stopped it after 5 years. We started amiodarone, which he tolerated initially and then it up with a cough and a wheezy sensation that also cleared with coughing. This has since resolved notwithstanding continuing his amiodarone.  We undertook cardioversion and he is holding sinus rhythm. His exercise tolerance is somewhat better.  I esophagitis the on he is in and evaluated and noted that a He has had complaints recently of more fatigue over the last 3 or 4 months. There is nothing specific that they identified. His TSH was for 3 months ago.  There has been no chest pain or palpitations. He's had no edema he does like to sleep a lot   Past Medical History  Diagnosis Date  . Acid reflux disease     history of  . H/O: GI bleed   . Paroxysmal atrial fibrillation     chronic anticoag; tikosyn  . Benign prostatic hypertrophy     history of  . Hepatic damage march 2009    retained hepatic stone , intrahepatic duct catheter  . Diabetes mellitus     insulin dep  . CAD (coronary artery disease)   . Hypertension   . 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 Cardioversion   . Cholecystectomy     history of  . Partial gastrectomy     history of  .  Shoulder surgery     left shoulder repair with 2 pens inserted  . Whipple procedure     operation  . 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 Inguinal hernia repair     inguinal herniorrhaphy, right... history of  . Appendectomy     history of  . Cardioversion 03/07/2012    Procedure: CARDIOVERSION;  Surgeon: Duke Salvia, MD;  Location: [facility] OR;  Service: Cardiovascular;  Laterality: N/A;    Current Outpatient Prescriptions  Medication Sig Dispense Refill  . amiodarone (PACERONE) 200 MG tablet Take 1 tablet (200 mg total) by mouth daily.  90 tablet  3  . Ascorbic Acid (VITAMIN C) 500 MG tablet Take 500 mg by mouth daily.        . carvedilol (COREG) 3.125 MG tablet Take 1 tablet (3.125 mg total) by mouth 2 (two) times daily with a meal.  180 tablet  3  . CVS GLUCOSAMINE-CHONDROITIN PO Take 1 tablet by mouth daily. 1200/600 mg      . Emollient (AMLACTIN XL) LOTN Apply 1 application topically daily. Apply lotion to legs      . finasteride (PROSCAR) 5 MG tablet Take 1 tablet (5 mg total) by mouth daily.  90 tablet  3  . Insulin Syringes, Disposable, (B-D INSULIN SYRINGE 1CC) U-100 1 ML MISC 1 each by Other route  2 (two) times daily. For use with insulin  100 each  3  . lipase/protease/amylase (CREON) 12000 UNITS CPEP Take 3 capsules by mouth as directed.      Marland Kitchen lisinopril (PRINIVIL,ZESTRIL) 2.5 MG tablet Take 1 tablet (2.5 mg total) by mouth daily.  90 tablet  3  . Multiple Vitamins-Minerals (CENTRUM SILVER PO) Take 1 tablet by mouth daily.       . Tamsulosin HCl (FLOMAX) 0.4 MG CAPS Take 1 capsule (0.4 mg total) by mouth 2 (two) times daily.  180 capsule  3  . triamcinolone (KENALOG) 0.1 % cream Apply 1 application topically 2 (two) times daily. Apply to left leg      . warfarin (COUMADIN) 5 MG tablet Take 5 mg by mouth as directed.        Allergies  Allergen Reactions  . Clindamycin   . Oxycodone-Acetaminophen   . Penicillins     REACTION: causes hives    Review of Systems negative  except from HPI and PMH  Physical Exam BP 102/54  Pulse 59  Ht 6\' 2"  (1.88 m)  Wt 192 lb (87.091 kg)  BMI 24.65 kg/m2  SpO2 96% Well developed and well nourished in no acute distress HENT normal E scleral and icterus clear Neck Supple JVP flat; carotids brisk and full Clear to ausculation Regular rate and rhythm, no murmurs gallops or rub Soft with active bowel sounds No clubbing cyanosis  Edema Alert and oriented, grossly normal motor and sensory function Skin Warm and Dry; lower extremity discoloration consistent with degenerative disease  ecg sinus rhythm at 60 Intervals 21/09/44 Axis is leftward at -5    Assessment and  Plan

## 2012-07-27 NOTE — Assessment & Plan Note (Signed)
Continue amiodarone. 

## 2012-07-27 NOTE — Patient Instructions (Signed)
Your physician wants you to follow-up in: 6 months with Dr. Klein. You will receive a reminder letter in the mail two months in advance. If you don't receive a letter, please call our office to schedule the follow-up appointment.  Your physician recommends that you continue on your current medications as directed. Please refer to the Current Medication list given to you today.  

## 2012-07-27 NOTE — Assessment & Plan Note (Signed)
No recurrent CT

## 2012-07-27 NOTE — Assessment & Plan Note (Signed)
We reviewed behavioral maneuvers to try to obviate some of his symptoms

## 2012-08-01 ENCOUNTER — Encounter: Payer: Self-pay | Admitting: Pharmacist

## 2012-08-08 ENCOUNTER — Ambulatory Visit (INDEPENDENT_AMBULATORY_CARE_PROVIDER_SITE_OTHER): Payer: Medicare Other | Admitting: *Deleted

## 2012-08-08 DIAGNOSIS — I4891 Unspecified atrial fibrillation: Secondary | ICD-10-CM

## 2012-08-30 ENCOUNTER — Encounter: Payer: Self-pay | Admitting: Gastroenterology

## 2012-09-10 ENCOUNTER — Ambulatory Visit (INDEPENDENT_AMBULATORY_CARE_PROVIDER_SITE_OTHER): Payer: Medicare Other | Admitting: Internal Medicine

## 2012-09-10 ENCOUNTER — Encounter: Payer: Self-pay | Admitting: Internal Medicine

## 2012-09-10 VITALS — BP 130/64 | HR 63 | Temp 97.5°F | Resp 16 | Wt 195.0 lb

## 2012-09-10 DIAGNOSIS — I872 Venous insufficiency (chronic) (peripheral): Secondary | ICD-10-CM

## 2012-09-10 NOTE — Progress Notes (Signed)
Subjective:    Patient ID: [name], male    DOB: [birth year], 78 y.o.   MRN: [id number]  HPI Mr. Santizo presents for brawny discoloration of the distal lower extremities, left worse than right. He does have chronic peripheral edema. He is diabetic but there is no raised edge or tenderness to the rash.  Past Medical History  Diagnosis Date  . Acid reflux disease     history of  . H/O: GI bleed   . Paroxysmal atrial fibrillation     chronic anticoag; tikosyn  . Benign prostatic hypertrophy     history of  . Hepatic damage march 2009    retained hepatic stone , intrahepatic duct catheter  . Diabetes mellitus     insulin dep  . CAD (coronary artery disease)   . Hypertension   . 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 Cardioversion   . Cholecystectomy     history of  . Partial gastrectomy     history of  . Shoulder surgery     left shoulder repair with 2 pens inserted  . Whipple procedure     operation  . 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 Inguinal hernia repair     inguinal herniorrhaphy, right... history of  . Appendectomy     history of  . Cardioversion 03/07/2012    Procedure: CARDIOVERSION;  Surgeon: Duke Salvia, MD;  Location: [facility] OR;  Service: Cardiovascular;  Laterality: N/A;   Family History  Problem Relation Age of Onset  . Coronary artery disease Father 57  . Heart disease Father     fatal MI  . Osteoarthritis Mother 31  . Arthritis Mother   . Coronary artery disease Brother     cabg, avr, pvd with sents  . Heart disease Brother     CABG, PVD  . Peripheral vascular disease Sister 30  . Fibromyalgia Sister   . Arthritis Sister   . Coronary artery disease Sister   . Diabetes Sister   . Cancer Brother    History   Social History  . Marital Status: Married   Spouse Name: N/A    Number of Children: 3  . Years of Education: N/A   Occupational History  . 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     Social History Main Topics  . Smoking status: Former Smoker    Types: Pipe    Quit date: 03/14/1991  . Smokeless tobacco: Never Used  . Alcohol Use: No  . Drug Use: No  . Sexually Active: Not Currently   Other Topics Concern  . Not on file   Social History Narrative   [employer] -Counselling psychologist. married 1954. 2 sons - 1956, 34; 1 daughter 68. 7 grandchildren. retired- Runner, broadcasting/film/video.  End of Life Care: DNR, no prolonged intubation (more than 2-3 days), no sustaining tube feeding, no futile or heroic measures. (Provided signed Out of Facility order and unsigned MOST form 6/112/12)   .mnmed     Review of Systems System review is negative for any constitutional, cardiac, pulmonary, GI or neuro symptoms or complaints other than as described in the HPI.     Objective:   Physical Exam Filed Vitals:   09/10/12 1659  BP:  130/64  Pulse: 63  Temp: 97.5 F (36.4 C)  Resp: 16   Gen'l - elderly white man in no distress Cor- RRR pulm - normal Derm - brawny non-blanching macular rash on the distal anterior lower extremity       Assessment & Plan:

## 2012-09-10 NOTE — Patient Instructions (Addendum)
Rash on the leg - this has the appearance of hemosiderin staining - when there is fluid in the legs there can be a small number of red bloods cells that can break down and the iron stains the skin and gives this kind of appearance. Other possibilities include a diabetic related rash but this would look different and be more uncomfortable.  Plan - Knee high support hose can reduce the swelling and thus the progression of staining  If this continues to get worse a punch biopsy can help with making a more definitive diagnosis.   Reference: go to [email or web address] and look up hemosiderin staining.

## 2012-09-11 DIAGNOSIS — I872 Venous insufficiency (chronic) (peripheral): Secondary | ICD-10-CM | POA: Insufficient documentation

## 2012-09-11 NOTE — Assessment & Plan Note (Signed)
Patient with venous insufficiency who present with rash that is c/w hemosiderin staining. No heat or tenderness to the rash. Unlikely to be necrobiosis.  Plan Conservative measures with otc support hose  If rash gets worse or painful - punch biopsy for diagnosis.

## 2012-09-19 ENCOUNTER — Ambulatory Visit (INDEPENDENT_AMBULATORY_CARE_PROVIDER_SITE_OTHER): Payer: Medicare Other | Admitting: *Deleted

## 2012-09-19 DIAGNOSIS — I4891 Unspecified atrial fibrillation: Secondary | ICD-10-CM

## 2012-09-19 LAB — POCT INR: INR: 2.1

## 2012-10-30 ENCOUNTER — Emergency Department (HOSPITAL_COMMUNITY): Payer: Medicare Other

## 2012-10-30 ENCOUNTER — Inpatient Hospital Stay (HOSPITAL_COMMUNITY)
Admission: EM | Admit: 2012-10-30 | Discharge: 2012-11-01 | DRG: 639 | Disposition: A | Discharge status: Home / Self Care | Payer: Medicare Other | Attending: Internal Medicine | Admitting: Internal Medicine

## 2012-10-30 DIAGNOSIS — Z9889 Other specified postprocedural states: Secondary | ICD-10-CM

## 2012-10-30 DIAGNOSIS — I4819 Other persistent atrial fibrillation: Secondary | ICD-10-CM | POA: Diagnosis present

## 2012-10-30 DIAGNOSIS — I10 Essential (primary) hypertension: Secondary | ICD-10-CM | POA: Diagnosis present

## 2012-10-30 DIAGNOSIS — R42 Dizziness and giddiness: Secondary | ICD-10-CM

## 2012-10-30 DIAGNOSIS — E119 Type 2 diabetes mellitus without complications: Secondary | ICD-10-CM

## 2012-10-30 DIAGNOSIS — K219 Gastro-esophageal reflux disease without esophagitis: Secondary | ICD-10-CM | POA: Diagnosis present

## 2012-10-30 DIAGNOSIS — N289 Disorder of kidney and ureter, unspecified: Secondary | ICD-10-CM

## 2012-10-30 DIAGNOSIS — Z87891 Personal history of nicotine dependence: Secondary | ICD-10-CM

## 2012-10-30 DIAGNOSIS — Z794 Long term (current) use of insulin: Secondary | ICD-10-CM

## 2012-10-30 DIAGNOSIS — Z833 Family history of diabetes mellitus: Secondary | ICD-10-CM

## 2012-10-30 DIAGNOSIS — I4891 Unspecified atrial fibrillation: Secondary | ICD-10-CM

## 2012-10-30 DIAGNOSIS — N400 Enlarged prostate without lower urinary tract symptoms: Secondary | ICD-10-CM | POA: Diagnosis present

## 2012-10-30 DIAGNOSIS — R569 Unspecified convulsions: Secondary | ICD-10-CM

## 2012-10-30 DIAGNOSIS — Z880 Allergy status to penicillin: Secondary | ICD-10-CM

## 2012-10-30 DIAGNOSIS — Z9089 Acquired absence of other organs: Secondary | ICD-10-CM

## 2012-10-30 DIAGNOSIS — E1169 Type 2 diabetes mellitus with other specified complication: Principal | ICD-10-CM | POA: Diagnosis present

## 2012-10-30 DIAGNOSIS — I2510 Atherosclerotic heart disease of native coronary artery without angina pectoris: Secondary | ICD-10-CM

## 2012-10-30 DIAGNOSIS — Z8249 Family history of ischemic heart disease and other diseases of the circulatory system: Secondary | ICD-10-CM

## 2012-10-30 DIAGNOSIS — E860 Dehydration: Secondary | ICD-10-CM

## 2012-10-30 DIAGNOSIS — E118 Type 2 diabetes mellitus with unspecified complications: Secondary | ICD-10-CM | POA: Diagnosis present

## 2012-10-30 DIAGNOSIS — E162 Hypoglycemia, unspecified: Secondary | ICD-10-CM | POA: Diagnosis present

## 2012-10-30 HISTORY — DX: Unspecified convulsions: R56.9

## 2012-10-30 LAB — URINALYSIS, ROUTINE W REFLEX MICROSCOPIC
Glucose, UA: NEGATIVE mg/dL
Ketones, ur: NEGATIVE mg/dL
Leukocytes, UA: NEGATIVE
Nitrite: NEGATIVE
Protein, ur: NEGATIVE mg/dL
pH: 5.5 (ref 5.0–8.0)

## 2012-10-30 LAB — CBC WITH DIFFERENTIAL/PLATELET
Basophils Relative: 1 % (ref 0–1)
Eosinophils Absolute: 0.4 10*3/uL (ref 0.0–0.7)
Eosinophils Relative: 8 % — ABNORMAL HIGH (ref 0–5)
HCT: 37.2 % — ABNORMAL LOW (ref 39.0–52.0)
Hemoglobin: 12.5 g/dL — ABNORMAL LOW (ref 13.0–17.0)
Lymphs Abs: 0.7 10*3/uL (ref 0.7–4.0)
MCH: 32.1 pg (ref 26.0–34.0)
MCHC: 33.6 g/dL (ref 30.0–36.0)
MCV: 95.4 fL (ref 78.0–100.0)
Monocytes Absolute: 0.8 10*3/uL (ref 0.1–1.0)
Monocytes Relative: 15 % — ABNORMAL HIGH (ref 3–12)
RBC: 3.9 MIL/uL — ABNORMAL LOW (ref 4.22–5.81)

## 2012-10-30 LAB — BASIC METABOLIC PANEL
BUN: 20 mg/dL (ref 6–23)
CO2: 26 mEq/L (ref 19–32)
Chloride: 102 mEq/L (ref 96–112)
GFR calc non Af Amer: 45 mL/min — ABNORMAL LOW (ref >90)
Glucose, Bld: 73 mg/dL (ref 70–99)
Potassium: 3.8 mEq/L (ref 3.5–5.1)

## 2012-10-30 LAB — TYPE AND SCREEN

## 2012-10-30 LAB — PROTIME-INR: INR: 2.9 — ABNORMAL HIGH (ref 0.00–1.49)

## 2012-10-30 LAB — GLUCOSE, CAPILLARY: Glucose-Capillary: 100 mg/dL — ABNORMAL HIGH (ref 70–99)

## 2012-10-30 LAB — CG4 I-STAT (LACTIC ACID): Lactic Acid, Venous: 2.29 mmol/L — ABNORMAL HIGH (ref 0.5–2.2)

## 2012-10-30 MED ORDER — WARFARIN SODIUM 2.5 MG PO TABS: 2.5000 mg | ORAL_TABLET | Freq: Every day | ORAL | Status: DC

## 2012-10-30 MED ORDER — INSULIN ASPART 100 UNIT/ML ~~LOC~~ SOLN
0.0000 [IU] | Freq: Three times a day (TID) | SUBCUTANEOUS | Status: DC
  Administered 2012-10-31: 3 [IU] via SUBCUTANEOUS
  Administered 2012-11-01: 2 [IU] via SUBCUTANEOUS

## 2012-10-30 MED ORDER — CARVEDILOL 3.125 MG PO TABS
3.1250 mg | ORAL_TABLET | Freq: Two times a day (BID) | ORAL | Status: DC
  Filled 2012-10-30: qty 1

## 2012-10-30 MED ORDER — LISINOPRIL 2.5 MG PO TABS
2.5000 mg | ORAL_TABLET | Freq: Every day | ORAL | Status: DC
  Administered 2012-10-31 – 2012-11-01 (×2): 2.5 mg via ORAL
  Filled 2012-10-30 (×2): qty 1

## 2012-10-30 MED ORDER — ONDANSETRON HCL 4 MG/2ML IJ SOLN: 4.0000 mg | Freq: Four times a day (QID) | INTRAMUSCULAR | Status: DC | PRN

## 2012-10-30 MED ORDER — SODIUM CHLORIDE 0.9 % IV BOLUS (SEPSIS)
500.0000 mL | Freq: Once | INTRAVENOUS | Status: AC
  Administered 2012-10-30: 500 mL via INTRAVENOUS

## 2012-10-30 MED ORDER — WARFARIN - PHARMACIST DOSING INPATIENT: Freq: Every day | Status: DC

## 2012-10-30 MED ORDER — AMIODARONE HCL 200 MG PO TABS
200.0000 mg | ORAL_TABLET | Freq: Every day | ORAL | Status: DC
  Filled 2012-10-30: qty 1

## 2012-10-30 MED ORDER — SENNOSIDES-DOCUSATE SODIUM 8.6-50 MG PO TABS: 1.0000 | ORAL_TABLET | Freq: Every evening | ORAL | Status: DC | PRN

## 2012-10-30 MED ORDER — ALUM & MAG HYDROXIDE-SIMETH 200-200-20 MG/5ML PO SUSP: 30.0000 mL | Freq: Four times a day (QID) | ORAL | Status: DC | PRN

## 2012-10-30 MED ORDER — FINASTERIDE 5 MG PO TABS
5.0000 mg | ORAL_TABLET | Freq: Every day | ORAL | Status: DC
  Administered 2012-10-31 – 2012-11-01 (×2): 5 mg via ORAL
  Filled 2012-10-30 (×2): qty 1

## 2012-10-30 MED ORDER — DEXTROSE-NACL 5-0.9 % IV SOLN
Freq: Once | INTRAVENOUS | Status: AC
  Administered 2012-10-30: 19:00:00 via INTRAVENOUS

## 2012-10-30 MED ORDER — KCL IN DEXTROSE-NACL 20-5-0.45 MEQ/L-%-% IV SOLN
INTRAVENOUS | Status: DC
  Administered 2012-10-31: 02:00:00 via INTRAVENOUS
  Filled 2012-10-30: qty 1000

## 2012-10-30 MED ORDER — ONDANSETRON HCL 4 MG/2ML IJ SOLN: 4.0000 mg | Freq: Once | INTRAMUSCULAR | Status: DC

## 2012-10-30 MED ORDER — PANCRELIPASE (LIP-PROT-AMYL) 12000-38000 UNITS PO CPEP
3.0000 | ORAL_CAPSULE | Freq: Every evening | ORAL | Status: DC
  Filled 2012-10-30: qty 3

## 2012-10-30 MED ORDER — TAMSULOSIN HCL 0.4 MG PO CAPS
0.4000 mg | ORAL_CAPSULE | Freq: Two times a day (BID) | ORAL | Status: DC
  Administered 2012-10-30 – 2012-11-01 (×4): 0.4 mg via ORAL
  Filled 2012-10-30 (×5): qty 1

## 2012-10-30 MED ORDER — TETANUS-DIPHTH-ACELL PERTUSSIS 5-2.5-18.5 LF-MCG/0.5 IM SUSP
0.5000 mL | Freq: Once | INTRAMUSCULAR | Status: AC
  Administered 2012-10-30: 0.5 mL via INTRAMUSCULAR
  Filled 2012-10-30: qty 0.5

## 2012-10-30 MED ORDER — ONDANSETRON HCL 4 MG PO TABS: 4.0000 mg | ORAL_TABLET | Freq: Four times a day (QID) | ORAL | Status: DC | PRN

## 2012-10-30 NOTE — Progress Notes (Signed)
ANTICOAGULATION CONSULT NOTE - Initial Consult  Pharmacy Consult for Coumadin Indication: h/o Afib  Allergies  Allergen Reactions  . Clindamycin   . Oxycodone-Acetaminophen   . Penicillins     REACTION: causes hives    Patient Measurements: Height: 6' 2.02" (188 cm) Weight: 195 lb 1.7 oz (88.5 kg) IBW/kg (Calculated) : 82.24   Vital Signs: Temp: 95.2 F (35.1 C) (12/03 2008) Temp src: Rectal (12/03 2008) BP: 121/56 mmHg (12/03 1911) Pulse Rate: 76  (12/03 1911)  Labs:  Basename 10/30/12 1930 10/30/12 1911  HGB -- 12.5*  HCT -- 37.2*  PLT -- 148*  APTT -- --  LABPROT -- 28.8*  INR -- 2.90*  HEPARINUNFRC -- --  CREATININE -- 1.39*  CKTOTAL -- --  CKMB -- --  TROPONINI <0.30 --    Estimated Creatinine Clearance: 45.2 ml/min (by C-G formula based on Cr of 1.39).   Medical History: Past Medical History  Diagnosis Date  . Acid reflux disease     history of  . H/O: GI bleed   . Paroxysmal atrial fibrillation     chronic anticoag; tikosyn  . Benign prostatic hypertrophy     history of  . Hepatic damage march 2009    retained hepatic stone , intrahepatic duct catheter  . Diabetes mellitus     insulin dep  . CAD (coronary artery disease)   . Hypertension   . 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

## 2012-10-30 NOTE — ED Notes (Addendum)
Notified EDP Wickline of pt's most recent CBG (88).  Received verbal order to increased D5%-0.9% NS drip from 134mL/hr to 149mL/hr.

## 2012-10-30 NOTE — ED Notes (Signed)
Pt in from home, [facility] called out to residence @ 17:41 for witness grand mal seizure new onset while standing in the kitchen lasting for 8 minutes in route from standing position, pt fell from standing position landing L side, hitting head on & wooden chair rail with LOC lasting from 17:41 to 17:49 when EMS arrived at scene, pt seized until 18:05, pts initial CBG 18, pt received D 10 with CBG of 85 then dropped to 52 & pt received D50 amp, pt CBG upon arrival to ED 214, pt A&O x4 upon arrival to ED, pt has x 2 contusion bil head, pt has lac to L elbow, pt removed from scoop stretcher by Bebe Shaggy, MD upon arrival to ED,  20 guage in route, pt received 5mg   Midazolam IM on route

## 2012-10-30 NOTE — ED Notes (Signed)
Admitting MD in with pt    

## 2012-10-30 NOTE — ED Notes (Signed)
Notified EDP Wickline of pt's rectal temp.  Was told to give pt warm blankets.  Pt now has warm blankets.  Will recheck temp later.

## 2012-10-30 NOTE — H&P (Signed)
PCP:   Casey Regulus, MD   Chief Complaint:  Loss of consciousness and seizures  HPI: This is a 78 year old gentleman with known history of diabetes with a history of a Whipple, today he checked his fingerstick blood sugars before dinner his sugars were 72. He took 15 units of 70/30, he started dinner approximately 20-25 minutes later. The wife states she looked up during dinner and he was slumped over. She called to him and received no response. She then noted he was drooling from the side of his mouth. She called 911. The the patient that started twitching fell to the side onto the floor where he went on to have a generalized seizure. EMS arrived in approximately 7 minutes, fingerstick blood sugars checked then was 18. He received glucose and was brought to the ER. Patient glucose have been stable in the ER, lowest 73. There was a downward trend in his glucose and but he was started on dextrose infusion. No further evidence of seizures, he did receive midazolam in the field. History provided by the patient and family members at the bedside.  Review of Systems:  The patient denies anorexia, fever, weight loss,, vision loss, decreased hearing, hoarseness, chest pain, syncope, dyspnea on exertion, peripheral edema, balance deficits, hemoptysis, abdominal pain, melena, hematochezia, severe indigestion/heartburn, hematuria, incontinence, genital sores, muscle weakness, suspicious skin lesions, transient blindness, difficulty walking, depression, unusual weight change, abnormal bleeding, enlarged lymph nodes, angioedema, and breast masses.  Past Medical History: Past Medical History  Diagnosis Date  . Acid reflux disease     history of  . H/O: GI bleed   . Paroxysmal atrial fibrillation     chronic anticoag; tikosyn  . Benign prostatic hypertrophy     history of  . Hepatic damage march 2009    retained hepatic stone , intrahepatic duct catheter  . Diabetes mellitus     insulin dep  . CAD  (coronary artery disease)   . Hypertension   . 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 Cardioversion   . Cholecystectomy     history of  . Partial gastrectomy     history of  . Shoulder surgery     left shoulder repair with 2 pens inserted  . Whipple procedure     operation  . 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 Inguinal hernia repair     inguinal herniorrhaphy, right... history of  . Appendectomy     history of  . Cardioversion 03/07/2012    Procedure: CARDIOVERSION;  Surgeon: Duke Salvia, MD;  Location: [facility] OR;  Service: Cardiovascular;  Laterality: N/A;    Medications: Prior to Admission medications   Medication Sig Start Date End Date Taking? Authorizing Provider  amiodarone (PACERONE) 200 MG tablet Take 1 tablet (200 mg total) by mouth daily. 03/12/12 03/12/13 Yes Duke Salvia, MD  Ascorbic Acid (VITAMIN C) 500 MG tablet Take 500 mg by mouth daily.    Yes Historical Provider, MD  carvedilol (COREG) 3.125 MG tablet Take 1 tablet (3.125 mg total) by mouth 2 (two) times daily with a meal. 04/26/12 04/26/13 Yes Duke Salvia, MD  [facility] GLUCOSAMINE-CHONDROITIN PO Take 1 tablet by mouth daily. 1200/600 mg   Yes Historical Provider, MD  finasteride (PROSCAR) 5 MG tablet Take 5  mg by mouth daily. 06/11/12 06/11/13 Yes Jacques Navy, MD  insulin NPH-insulin regular (NOVOLIN 70/30) (70-30) 100 UNIT/ML injection Inject 14-16 Units into the skin 2 (two) times daily with a meal. 14 units in the morning and 16 units at night   Yes Historical Provider, MD  lipase/protease/amylase (CREON) 12000 UNITS CPEP Take 3 capsules by mouth every evening.    Yes Historical Provider, MD  lisinopril (PRINIVIL,ZESTRIL) 2.5 MG tablet Take 1 tablet (2.5 mg total) by mouth daily. 04/26/12 04/26/13 Yes Duke Salvia, MD  Multiple  Vitamins-Minerals (CENTRUM SILVER PO) Take 1 tablet by mouth daily.    Yes Historical Provider, MD  Probiotic Product (PROBIOTIC MULTI-ENZYME PO) TAKE 5 PER DAY WITH MEALS   Yes Historical Provider, MD  Tamsulosin HCl (FLOMAX) 0.4 MG CAPS Take 1 capsule (0.4 mg total) by mouth 2 (two) times daily. 06/11/12  Yes Jacques Navy, MD  triamcinolone (KENALOG) 0.1 % cream Apply 1 application topically 2 (two) times daily. Apply to left leg   Yes Historical Provider, MD  warfarin (COUMADIN) 5 MG tablet Take 2.5-7.5 mg by mouth daily. Friday 7.5mg ; rest of the week 2.5mg  06/11/12  Yes Jacques Navy, MD  Emollient (AMLACTIN XL) LOTN Apply 1 application topically daily. Apply lotion to legs    Historical Provider, MD  Insulin Syringes, Disposable, (B-D INSULIN SYRINGE 1CC) U-100 1 ML MISC 1 each by Other route 2 (two) times daily. For use with insulin 06/11/12   Jacques Navy, MD    Allergies:   Allergies  Allergen Reactions  . Clindamycin   . Oxycodone-Acetaminophen   . Penicillins     REACTION: causes hives    Social History:  reports that he quit smoking about 21 years ago. His smoking use included Pipe. He has never used smokeless tobacco. He reports that he does not drink alcohol or use illicit drugs.  Family History: Family History  Problem Relation Age of Onset  . Coronary artery disease Father 39  . Heart disease Father     fatal MI  . Osteoarthritis Mother 12  . Arthritis Mother   . Coronary artery disease Brother     cabg, avr, pvd with sents  . Heart disease Brother     CABG, PVD  . Peripheral vascular disease Sister 18  . Fibromyalgia Sister   . Arthritis Sister   . Coronary artery disease Sister   . Diabetes Sister   . Cancer Brother     Physical Exam: Filed Vitals:   10/30/12 1856 10/30/12 1911 10/30/12 2008  BP:  121/56   Pulse:  76   Temp:  97.5 F (36.4 C) 95.2 F (35.1 C)  TempSrc:  Oral Rectal  Resp:  19   SpO2: 100% 96%     General:  Alert and  oriented times three, well developed and nourished, no acute distress Eyes: PERRLA, pink conjunctiva, no scleral icterus ENT: Moist oral mucosa, neck supple, no thyromegaly, bruising on forehead  Lungs: clear to ascultation, no wheeze, no crackles, no use of accessory muscles Cardiovascular: regular rate and rhythm, no regurgitation, no gallops, no murmurs. No carotid bruits, no JVD Abdomen: soft, positive BS, non-tender, non-distended, no organomegaly, not an acute abdomen GU: not examined Neuro: CN II - XII grossly intact, sensation intact Musculoskeletal: strength 5/5 all extremities, no clubbing, cyanosis or edema Skin: no rash, no subcutaneous crepitation, no decubitus, skin laceration left elbow  Psych: appropriate patient   Labs on Admission:   Basename 10/30/12 1911  NA 137  K 3.8  CL 102  CO2 26  GLUCOSE 73  BUN 20  CREATININE 1.39*  CALCIUM 9.1  MG --  PHOS --   No results found for this basename: AST:2,ALT:2,ALKPHOS:2,BILITOT:2,PROT:2,ALBUMIN:2 in the last 72 hours No results found for this basename: LIPASE:2,AMYLASE:2 in the last 72 hours  Basename 10/30/12 1911  WBC 5.5  NEUTROABS 3.5  HGB 12.5*  HCT 37.2*  MCV 95.4  PLT 148*    Basename 10/30/12 1930  CKTOTAL --  CKMB --  CKMBINDEX --  TROPONINI <0.30   No components found with this basename: POCBNP:3 No results found for this basename: DDIMER:2 in the last 72 hours No results found for this basename: HGBA1C:2 in the last 72 hours No results found for this basename: CHOL:2,HDL:2,LDLCALC:2,TRIG:2,CHOLHDL:2,LDLDIRECT:2 in the last 72 hours No results found for this basename: TSH,T4TOTAL,FREET3,T3FREE,THYROIDAB in the last 72 hours No results found for this basename: VITAMINB12:2,FOLATE:2,FERRITIN:2,TIBC:2,IRON:2,RETICCTPCT:2 in the last 72 hours  Micro Results: No results found for this or any previous visit (from the past 240 hour(s)). Results for [name], [name] (MRN [id number]) as of 10/30/2012  23:38  Ref. Range 10/30/2012 20:41  Color, Urine Latest Range: YELLOW  YELLOW  APPearance Latest Range: CLEAR  CLEAR  Specific Gravity, Urine Latest Range: 1.005-1.030  1.017  pH Latest Range: 5.0-8.0  5.5  Glucose Latest Range: NEGATIVE mg/dL NEGATIVE  Bilirubin Urine Latest Range: NEGATIVE  NEGATIVE  Ketones, ur Latest Range: NEGATIVE mg/dL NEGATIVE  Protein Latest Range: NEGATIVE mg/dL NEGATIVE  Urobilinogen, UA Latest Range: 0.0-1.0 mg/dL 0.2  Nitrite Latest Range: NEGATIVE  NEGATIVE  Leukocytes, UA Latest Range: NEGATIVE  NEGATIVE  Hgb urine dipstick Latest Range: NEGATIVE  NEGATIVE    Radiological Exams on Admission: Dg Chest 2 View  10/30/2012  *RADIOLOGY REPORT*  Clinical Data: Syncope.  Chest pain.  CHEST - 2 VIEW  Comparison: 08/20/2008  Findings: A residual catheter fragment is again seen within the right pulmonary artery, without change since prior exam.  Heart size is normal.  Both lungs are otherwise clear.  No evidence of pleural effusion.  No mass or lymphadenopathy identified.  IMPRESSION: No acute findings.  Residual catheter fragment again seen within the right pulmonary artery.   Original Report Authenticated By: Myles Rosenthal, M.D.    Ct Head Wo Contrast  10/30/2012  *RADIOLOGY REPORT*  Clinical Data:  Pain  CT HEAD WITHOUT CONTRAST CT CERVICAL SPINE WITHOUT CONTRAST  Technique:  Multidetector CT imaging of the head and cervical spine was performed following the standard protocol without intravenous contrast.  Multiplanar CT image reconstructions of the cervical spine were also generated.  Comparison:    MRI 08/21/2010  CT HEAD  Findings: Ventricle size is normal.  Negative for acute infarct. Negative for hemorrhage or mass.  No edema or midline shift. Calvarium is intact.  Paranasal sinuses are clear  IMPRESSION: No acute abnormality.  Normal for age.  CT CERVICAL SPINE  Findings: Normal alignment and no fracture.  No mass lesion.  There is accentuated lordosis of the lumbar  spine.  Mild disc and facet degeneration is present.  IMPRESSION: Mild degenerative changes.  No acute abnormality in the cervical spine.   Original Report Authenticated By: Janeece Riggers, M.D.    Ct Cervical Spine Wo Contrast  10/30/2012  *RADIOLOGY REPORT*  Clinical Data:  Pain  CT HEAD WITHOUT CONTRAST CT CERVICAL SPINE WITHOUT CONTRAST  Technique:  Multidetector CT imaging of the head and cervical spine was performed following the standard protocol without  intravenous contrast.  Multiplanar CT image reconstructions of the cervical spine were also generated.  Comparison:    MRI 08/21/2010  CT HEAD  Findings: Ventricle size is normal.  Negative for acute infarct. Negative for hemorrhage or mass.  No edema or midline shift. Calvarium is intact.  Paranasal sinuses are clear  IMPRESSION: No acute abnormality.  Normal for age.  CT CERVICAL SPINE  Findings: Normal alignment and no fracture.  No mass lesion.  There is accentuated lordosis of the lumbar spine.  Mild disc and facet degeneration is present.  IMPRESSION: Mild degenerative changes.  No acute abnormality in the cervical spine.   Original Report Authenticated By: Janeece Riggers, M.D.     Assessment/Plan Present on Admission:  . Hypoglycemia Seizures  Admit to step down for observation overnight  D5 half-normal saline with fingerstick blood sugars every 2 hours ADA diet, patient needs diabetic education .  Marland Kitchen DIABETES MELLITUS See above  . ATRIAL FIBRILLATION, PAROXYSMAL Continue Coumadin, pharmacy to dose History sarcoma CAD Stable  DO NOT RESUSCITATE No need for DVT prophylaxis   [name] 10/30/2012, 11:36 PM

## 2012-10-30 NOTE — ED Provider Notes (Signed)
History     CSN: [id number]  Arrival date & time 10/30/12  4098   First MD Initiated Contact with Patient 10/30/12 1852      Chief Complaint  Patient presents with  . Seizures    Patient is a 78 y.o. male presenting with seizures. The history is provided by the patient, the EMS personnel and the spouse.  Seizures  This is a new problem. The current episode started less than 1 hour ago. The problem has been rapidly improving. The most recent episode lasted more than 5 minutes. Associated symptoms include vomiting. Characteristics include loss of consciousness. The seizure(s) had no focality. There has been no fever. Meds prior to arrival: midazolam, glucose.  improved with glucose Worsened by nothing  Pt presents from home from presumed hypoglycemic episode. Per wife, pt was eating dinner, he was at baseline, then he began to have a seizure.  EMS was called and on their arrival glucose was 18.  He was given glucose with improvement in glucose and mental status.  His glucose began to drop again and he was given more glucose.  He was also given midazolam.  He is now improved.  He does not have insulin pump.    He reports he had otherwise been well until tonight His PCP is dr Filbert Schilder He has no h/o seizure   Past Medical History  Diagnosis Date  . Acid reflux disease     history of  . H/O: GI bleed   . Paroxysmal atrial fibrillation     chronic anticoag; tikosyn  . Benign prostatic hypertrophy     history of  . Hepatic damage march 2009    retained hepatic stone , intrahepatic duct catheter  . Diabetes mellitus     insulin dep  . CAD (coronary artery disease)   . Hypertension   . 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 Cardioversion   . Cholecystectomy     history of  . Partial gastrectomy      history of  . Shoulder surgery     left shoulder repair with 2 pens inserted  . Whipple procedure     operation  . 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 Inguinal hernia repair     inguinal herniorrhaphy, right... history of  . Appendectomy     history of  . Cardioversion 03/07/2012    Procedure: CARDIOVERSION;  Surgeon: Duke Salvia, MD;  Location: [facility] OR;  Service: Cardiovascular;  Laterality: N/A;    Family History  Problem Relation Age of Onset  . Coronary artery disease Father 64  . Heart disease Father     fatal MI  . Osteoarthritis Mother 45  . Arthritis Mother   . Coronary artery disease Brother     cabg, avr, pvd with sents  . Heart disease Brother     CABG, PVD  . Peripheral vascular disease Sister 21  . Fibromyalgia Sister   . Arthritis Sister   . Coronary artery disease Sister   . Diabetes Sister   . Cancer Brother     History  Substance Use Topics  . Smoking status: Former Smoker    Types: Pipe    Quit date: 03/14/1991  . Smokeless tobacco: Never Used  . Alcohol Use: No  Review of Systems  Gastrointestinal: Positive for vomiting.  Neurological: Positive for seizures and loss of consciousness.  All other systems reviewed and are negative.    Allergies  Clindamycin; Oxycodone-acetaminophen; and Penicillins  Home Medications   Current Outpatient Rx  Name  Route  Sig  Dispense  Refill  . AMIODARONE HCL 200 MG PO TABS   Oral   Take 1 tablet (200 mg total) by mouth daily.   90 tablet   3   . VITAMIN C 500 MG PO TABS   Oral   Take 500 mg by mouth daily.           Marland Kitchen CARVEDILOL 3.125 MG PO TABS   Oral   Take 1 tablet (3.125 mg total) by mouth 2 (two) times daily with a meal.   180 tablet   3   . CVS GLUCOSAMINE-CHONDROITIN PO   Oral   Take 1 tablet by mouth daily. 1200/600 mg         . AMLACTIN XL EX LOTN   Topical   Apply 1 application topically daily. Apply lotion to legs         . FINASTERIDE 5 MG PO TABS   Oral    Take 1 tablet (5 mg total) by mouth daily.   90 tablet   3   . INSULIN SYRINGES (DISPOSABLE) U-100 1 ML MISC   Other   1 each by Other route 2 (two) times daily. For use with insulin   100 each   3   . PANCRELIPASE (LIP-PROT-AMYL) 12000 UNITS PO CPEP   Oral   Take 3 capsules by mouth as directed.         Marland Kitchen LISINOPRIL 2.5 MG PO TABS   Oral   Take 1 tablet (2.5 mg total) by mouth daily.   90 tablet   3   . CENTRUM SILVER PO   Oral   Take 1 tablet by mouth daily.          Marland Kitchen PROBIOTIC MULTI-ENZYME PO      TAKE 5 PER DAY WITH MEALS         . TAMSULOSIN HCL 0.4 MG PO CAPS   Oral   Take 1 capsule (0.4 mg total) by mouth 2 (two) times daily.   180 capsule   3     Overdue for yearly physical must see md b4 additio ...   . TRIAMCINOLONE ACETONIDE 0.1 % EX CREA   Topical   Apply 1 application topically 2 (two) times daily. Apply to left leg         . WARFARIN SODIUM 5 MG PO TABS   Oral   Take 5 mg by mouth as directed.           BP 121/56  Pulse 76  Temp 97.5 F (36.4 C) (Oral)  Resp 19  SpO2 96%  Physical Exam CONSTITUTIONAL: Well developed/well nourished HEAD AND FACE: bruising/hematoma to forehead.  No crepitance noted EYES: EOMI/PERRL ENMT: Mucous membranes dry.  No dental injury noted.   Neck - c-collar in place SPINE:cervical spine tender.  Thoracic/lumbar spine nontender.  No bruising/crepitance/stepoffs noted to spine CV: S1/S2 noted, no murmurs/rubs/gallops noted LUNGS: Lungs are clear to auscultation bilaterally, no apparent distress ABDOMEN: soft, nontender, no rebound or guarding GU:no cva tenderness NEURO: Pt is awake/alert, moves all extremitiesx4, no focal weakness noted EXTREMITIES: pulses normal, full ROM. Scattered abrasions with skin tear noted to left elbow.  He has chronic right elbow pain/deformity from previous  surgery to right UE that is unchanged.  He can range each hip and shoulder without tenderness SKIN: warm, color  normal PSYCH: no abnormalities of mood noted  ED Course  Procedures  CRITICAL CARE Performed by: Joya Gaskins   Total critical care time: 40  Critical care time was exclusive of separately billable procedures and treating other patients.  Critical care was necessary to treat or prevent imminent or life-threatening deterioration.  Critical care was time spent personally by me on the following activities: development of treatment plan with patient and/or surrogate as well as nursing, discussions with consultants, evaluation of patient's response to treatment, examination of patient, obtaining history from patient or surrogate, ordering and performing treatments and interventions, ordering and review of laboratory studies, ordering and review of radiographic studies, pulse oximetry and re-evaluation of patient's condition.   Labs Reviewed  GLUCOSE, CAPILLARY - Abnormal; Notable for the following:    Glucose-Capillary 100 (*)     All other components within normal limits  BASIC METABOLIC PANEL  CBC WITH DIFFERENTIAL  CULTURE, BLOOD (ROUTINE X 2)  CULTURE, BLOOD (ROUTINE X 2)  TROPONIN I  PROTIME-INR  TYPE AND SCREEN  URINALYSIS, ROUTINE W REFLEX MICROSCOPIC  URINE CULTURE   8:33 PM Pt seen on arrival.  His mental status was improving on arrival.  He had severe hypoglycemia likely causing seizure.  Will need to be maintained on dextrose infusion.  Labs pending.  He has been stabilized   10:54 PM Pt has been stabilized but is still having lower glucose (80-100) despite infusion of dextrose.  His mental status is normal.  He will need admission for monitoring.  D/w hospitalist, will admit for likely stepdown for monitoring.   He is not septic appearing.  His lactate was elevated but likely due to his seizure at home    MDM  Nursing notes including past medical history and social history reviewed and considered in documentation xrays reviewed and considered Labs/vital  reviewed and considered Previous records reviewed and considered - h/o diabetes, afib he is on coumadin.          Date: 10/30/2012  Rate: 70  Rhythm: normal sinus rhythm  QRS Axis: left  Intervals: qt prolonged  ST/T Wave abnormalities: nonspecific ST changes  Conduction Disutrbances:none     Joya Gaskins, MD 10/30/12 2256

## 2012-10-31 ENCOUNTER — Encounter (HOSPITAL_COMMUNITY): Payer: Self-pay | Admitting: Family Medicine

## 2012-10-31 DIAGNOSIS — I4891 Unspecified atrial fibrillation: Secondary | ICD-10-CM

## 2012-10-31 DIAGNOSIS — E119 Type 2 diabetes mellitus without complications: Secondary | ICD-10-CM

## 2012-10-31 LAB — HEMOGLOBIN A1C
Hgb A1c MFr Bld: 6.5 % — ABNORMAL HIGH (ref <5.7)
Mean Plasma Glucose: 140 mg/dL — ABNORMAL HIGH (ref <117)

## 2012-10-31 LAB — BASIC METABOLIC PANEL
BUN: 14 mg/dL (ref 6–23)
CO2: 25 mEq/L (ref 19–32)
Calcium: 8.6 mg/dL (ref 8.4–10.5)
GFR calc non Af Amer: 64 mL/min — ABNORMAL LOW (ref >90)
Glucose, Bld: 173 mg/dL — ABNORMAL HIGH (ref 70–99)
Sodium: 137 mEq/L (ref 135–145)

## 2012-10-31 LAB — CBC
MCH: 30.9 pg (ref 26.0–34.0)
MCHC: 32.4 g/dL (ref 30.0–36.0)
MCV: 95.3 fL (ref 78.0–100.0)
Platelets: 140 10*3/uL — ABNORMAL LOW (ref 150–400)
RBC: 3.79 MIL/uL — ABNORMAL LOW (ref 4.22–5.81)

## 2012-10-31 LAB — GLUCOSE, CAPILLARY
Glucose-Capillary: 145 mg/dL — ABNORMAL HIGH (ref 70–99)
Glucose-Capillary: 165 mg/dL — ABNORMAL HIGH (ref 70–99)
Glucose-Capillary: 148 mg/dL — ABNORMAL HIGH (ref 70–99)
Glucose-Capillary: 152 mg/dL — ABNORMAL HIGH (ref 70–99)
Glucose-Capillary: 159 mg/dL — ABNORMAL HIGH (ref 70–99)
Glucose-Capillary: 90 mg/dL (ref 70–99)

## 2012-10-31 MED ORDER — SODIUM CHLORIDE 0.45 % IV SOLN
INTRAVENOUS | Status: DC
  Administered 2012-10-31: 08:00:00 via INTRAVENOUS

## 2012-10-31 MED ORDER — WARFARIN SODIUM 7.5 MG PO TABS: 7.5000 mg | ORAL_TABLET | ORAL | Status: DC

## 2012-10-31 MED ORDER — MUPIROCIN 2 % EX OINT
1.0000 "application " | TOPICAL_OINTMENT | Freq: Two times a day (BID) | CUTANEOUS | Status: DC
  Administered 2012-10-31 (×2): 1 via NASAL
  Filled 2012-10-31 (×2): qty 22

## 2012-10-31 MED ORDER — PANCRELIPASE (LIP-PROT-AMYL) 12000-38000 UNITS PO CPEP
1.0000 | ORAL_CAPSULE | Freq: Three times a day (TID) | ORAL | Status: DC
  Administered 2012-10-31 – 2012-11-01 (×3): 1 via ORAL
  Filled 2012-10-31 (×6): qty 1

## 2012-10-31 MED ORDER — WARFARIN SODIUM 2.5 MG PO TABS
2.5000 mg | ORAL_TABLET | ORAL | Status: DC
  Administered 2012-10-31: 2.5 mg via ORAL
  Filled 2012-10-31 (×2): qty 1

## 2012-10-31 MED ORDER — AMIODARONE HCL 200 MG PO TABS
200.0000 mg | ORAL_TABLET | Freq: Every day | ORAL | Status: DC
  Administered 2012-10-31 (×2): 200 mg via ORAL
  Filled 2012-10-31 (×3): qty 1

## 2012-10-31 MED ORDER — CHLORHEXIDINE GLUCONATE CLOTH 2 % EX PADS
6.0000 | MEDICATED_PAD | Freq: Every day | CUTANEOUS | Status: DC
  Administered 2012-10-31 – 2012-11-01 (×2): 6 via TOPICAL

## 2012-10-31 MED ORDER — CARVEDILOL 3.125 MG PO TABS
3.1250 mg | ORAL_TABLET | Freq: Two times a day (BID) | ORAL | Status: DC
  Administered 2012-10-31 – 2012-11-01 (×4): 3.125 mg via ORAL
  Filled 2012-10-31 (×5): qty 1

## 2012-10-31 MED ORDER — INSULIN ASPART 100 UNIT/ML ~~LOC~~ SOLN
6.0000 [IU] | Freq: Three times a day (TID) | SUBCUTANEOUS | Status: DC
  Administered 2012-10-31 – 2012-11-01 (×3): 6 [IU] via SUBCUTANEOUS

## 2012-10-31 MED ORDER — INSULIN DETEMIR 100 UNIT/ML ~~LOC~~ SOLN
10.0000 [IU] | Freq: Every day | SUBCUTANEOUS | Status: DC
  Administered 2012-10-31: 10 [IU] via SUBCUTANEOUS
  Filled 2012-10-31 (×2): qty 10

## 2012-10-31 NOTE — Progress Notes (Signed)
ANTICOAGULATION CONSULT NOTE - Initial Consult  Pharmacy Consult for Coumadin Indication: h/o Afib  Allergies  Allergen Reactions  . Clindamycin   . Oxycodone-Acetaminophen   . Penicillins     REACTION: causes hives    Patient Measurements: Height: 6\' 2"  (188 cm) Weight: 191 lb 9.3 oz (86.9 kg) IBW/kg (Calculated) : 82.2   Vital Signs: Temp: 97.3 F (36.3 C) (12/04 0042) Temp src: Oral (12/04 0042) BP: 145/68 mmHg (12/04 0818) Pulse Rate: 72  (12/04 0818)  Labs:  Basename 10/31/12 0435 10/30/12 1930 10/30/12 1911  HGB 11.7* -- 12.5*  HCT 36.1* -- 37.2*  PLT 140* -- 148*  APTT -- -- --  LABPROT 27.2* -- 28.8*  INR 2.68* -- 2.90*  HEPARINUNFRC -- -- --  CREATININE 1.03 -- 1.39*  CKTOTAL -- -- --  CKMB -- -- --  TROPONINI -- <0.30 --    Estimated Creatinine Clearance: 61 ml/min (by C-G formula based on Cr of 1.03).   Medications:  .    . amiodarone  200 mg Oral QHS  . carvedilol  3.125 mg Oral BID WC  . Chlorhexidine Gluconate Cloth  6 each Topical Q0600  . [COMPLETED] dextrose 5 % and 0.9% NaCl   Intravenous Once  . finasteride  5 mg Oral Daily  . insulin aspart  0-15 Units Subcutaneous TID WC  . insulin aspart  6 Units Subcutaneous TID WC  . insulin detemir  10 Units Subcutaneous Daily  . lipase/protease/amylase  3 capsule Oral QPM  . lisinopril  2.5 mg Oral Daily  . mupirocin ointment  1 application Nasal BID  . ondansetron  4 mg Intravenous Once  . [COMPLETED] sodium chloride  500 mL Intravenous Once  . Tamsulosin HCl  0.4 mg Oral BID  . [COMPLETED] TDaP  0.5 mL Intramuscular Once  . warfarin  2.5 mg Oral Custom  . warfarin  7.5 mg Oral Q Fri-1800  . Warfarin - Pharmacist Dosing Inpatient   Does not apply q1800  . [DISCONTINUED] amiodarone  200 mg Oral Daily  . [DISCONTINUED] carvedilol  3.125 mg Oral BID WC  . [DISCONTINUED] warfarin  2.5-7.5 mg Oral Daily  Home warfarin dose:  2.5 mg daily except 7.5 mg Fri  Assessment: 78 yo male admitted  with hypoglycemia/seizure, h/o Afib, to continue anticoagulation.  INR is therapeutic at 2.68.  Goal of Therapy:  INR 2-3 Monitor platelets by anticoagulation protocol: Yes   Plan:  Resume home warfarin dose. F/U daily INR  Mickeal Skinner 10/31/2012,10:13 AM

## 2012-10-31 NOTE — Progress Notes (Signed)
Subjective: Mr. Nygaard is admitted for acute hypoglycemia to 50 with associated seizure. ED eval was unremarkable for any other acute problem: CT brain negative, C-spine CT negative, CXR NAD. This AM he is awake and alert and feels OK. He admits that he has had middle of the night hypoglycemia intermittently. His current home regimen is 70/30 14-16 units before breakfast and supper. Last A1C 7.2%  Objective: Lab: Lab Results  Component Value Date   WBC 6.9 10/31/2012   HGB 11.7* 10/31/2012   HCT 36.1* 10/31/2012   MCV 95.3 10/31/2012   PLT 140* 10/31/2012   BMET    Component Value Date/Time   NA 137 10/31/2012 0435   K 4.1 10/31/2012 0435   CL 103 10/31/2012 0435   CO2 25 10/31/2012 0435   GLUCOSE 173* 10/31/2012 0435   GLUCOSE 137* 12/05/2006 1459   BUN 14 10/31/2012 0435   CREATININE 1.03 10/31/2012 0435   CALCIUM 8.6 10/31/2012 0435   GFRNONAA 64* 10/31/2012 0435   GFRAA 74* 10/31/2012 0435     Imaging: reviewed CT brain - negative, CT neck - negative, CXR - NAD  Scheduled Meds:   . amiodarone  200 mg Oral QHS  . carvedilol  3.125 mg Oral BID WC  . Chlorhexidine Gluconate Cloth  6 each Topical Q0600  . [COMPLETED] dextrose 5 % and 0.9% NaCl   Intravenous Once  . finasteride  5 mg Oral Daily  . insulin aspart  0-15 Units Subcutaneous TID WC  . lipase/protease/amylase  3 capsule Oral QPM  . lisinopril  2.5 mg Oral Daily  . mupirocin ointment  1 application Nasal BID  . ondansetron  4 mg Intravenous Once  . [COMPLETED] sodium chloride  500 mL Intravenous Once  . Tamsulosin HCl  0.4 mg Oral BID  . [COMPLETED] TDaP  0.5 mL Intramuscular Once  . Warfarin - Pharmacist Dosing Inpatient   Does not apply q1800  . [DISCONTINUED] amiodarone  200 mg Oral Daily  . [DISCONTINUED] carvedilol  3.125 mg Oral BID WC  . [DISCONTINUED] warfarin  2.5-7.5 mg Oral Daily   Continuous Infusions:   . dextrose 5 % and 0.45 % NaCl with KCl 20 mEq/L 75 mL/hr at 10/31/12 0152   PRN Meds:.alum & mag  hydroxide-simeth, ondansetron (ZOFRAN) IV, ondansetron, senna-docusate   Physical Exam: Filed Vitals:   10/31/12 0600  BP: 135/92  Pulse: 62  Temp:   Resp: 19   Gen;l - WNWD white man in no distress HEENT - bruising on fore head right and scalp, C&S clear, PERRLA Neck- supple Cor - 2+ radial Pulm - normal respirations Abd - soft Neuro - alert and oriented x 3, speech clear, MAE     Assessment/Plan: 1. DM - patient had CBG 70's before supper and then took his 70/30 followed by change in LOC, seizure and CBG to 18!!! Brought to ED and now on D5 1/2 NS with K. He has been on a twice a day regimenof 70/30 for 20 years but by his history his control has been variable with frequent lows. CBG (last 3)   Basename 10/31/12 0327 10/31/12 0045 10/31/12 0005  GLUCAP 165* 145* 142*    Plan Move to med-surg  Change IV to 1/2 NS at 50 cc/hr  Start Levemir 10 units qAM -start today  Humalog 6,6,6   Illene Regulus [facility] (o) [phone]; (c) [phone] Call-grp - Patsi Sears IM  Tele: [phone]  10/31/2012, 7:36 AM

## 2012-10-31 NOTE — Progress Notes (Signed)
CRITICAL VALUE ALERT  Critical value received: MRSA PCR Positive  Date of notification:  10/31/12  Time of notification:  0515  Critical value read back: yes  Nurse who received alert:  M.Foster Simpson, RN  MD notified (1st page):    Time of first page:    MD notified (2nd page):  Time of second page:  Responding MD:    Time MD responded:    Standing MRSA orders in use.

## 2012-10-31 NOTE — Progress Notes (Signed)
Patient transferred to [facility] room 4. Alert and oriented. IV fluids infusing. Oriented to room. Call bell in reach.

## 2012-11-01 LAB — GLUCOSE, CAPILLARY
Glucose-Capillary: 132 mg/dL — ABNORMAL HIGH (ref 70–99)
Glucose-Capillary: 134 mg/dL — ABNORMAL HIGH (ref 70–99)

## 2012-11-01 LAB — URINE CULTURE: Colony Count: NO GROWTH

## 2012-11-01 MED ORDER — INSULIN ASPART 100 UNIT/ML ~~LOC~~ SOLN: 6.0000 [IU] | Freq: Three times a day (TID) | SUBCUTANEOUS | Status: DC

## 2012-11-01 MED ORDER — INSULIN DETEMIR 100 UNIT/ML ~~LOC~~ SOLN: 10.0000 [IU] | Freq: Every day | SUBCUTANEOUS | Status: DC

## 2012-11-01 MED ORDER — INSULIN PEN NEEDLE 31G X 5 MM MISC: 120.0000 | Freq: Four times a day (QID) | Status: DC

## 2012-11-01 NOTE — Discharge Summary (Signed)
NAMEMarland Hoffman  NAREG, BREIGHNER NO.:  [id number]  MEDICAL RECORD NO.:  [id number]  LOCATION:  [id number]                        FACILITY:  [facility]  PHYSICIAN:  Rosalyn Gess. Koriana Stepien, MD  DATE OF BIRTH:  11-Jun-1925  DATE OF ADMISSION:  10/30/2012 DATE OF DISCHARGE:  11/01/2012                              DISCHARGE SUMMARY   ADMITTING DIAGNOSES: 1. Hypoglycemic episode with seizure. 2. Diabetes. 3. Atrial fibrillation, paroxysmal.  DISCHARGE DIAGNOSES: 1. Diabetes, better controlled. 2. Seizures, no repeat secondary to hypoglycemia. 3. Diabetes, stable. 4. Atrial fibrillation, stable.  CONSULTANTS:  None.  PROCEDURES: 1. CT scan of the head without contrast, which showed no acute     abnormality and normal for age. 2. CT of the cervical spine, which showed mild degenerative changes     with no acute abnormality of the cervical spine. 3. Chest x-ray, which showed no acute findings.  Residual catheter     fragment seen within the right pulmonary artery.  HISTORY OF PRESENT ILLNESS:  [name] is an 78 year old with a complex past medical history including paroxysmal atrial fibrillation, history of pancreatic cancer, history of diabetes.  He has been on a regimen of 70/30 insulin before 2 meals daily.  The patient was at home in his usual state of health, when he had a sudden weakness.  His before dinner blood sugar was 72 and he took 15 units of 70/30.  He was eating his dinner and suddenly slumped over and was unresponsive.  She noted he was drooling from the side of his mouth.  She called EMS.  On their arrival within 7 minutes, the patient had a CBG of 18.  He received glucose in the field and was brought to the emergency department.  His glucose levels have been stable in the ER with a lows of 73.  Because of his acute episode of hypoglycemia, he was admitted to hospital for IV fluids and closer monitoring.  Please see the H and P for past medical history, family  history, social history, and admission examination.  HOSPITAL COURSE:  The patient was initially in the step-down unit on telemetry, but was stable and moved to a regular med/surg bed.  The patient was followed with sliding scale.  While in hospital, his insulin regimen was changed to Levemir 10 units daily and NovoLog 6 units before meals.  On this regimen, the patient did relatively well with no recurrent hypoglycemic episodes.  Lowest blood sugar was 73.  The patient remained awake and alert and had no other complaints.  The patient's hypoglycemic event resolved, he is at this point ready and stable for discharge to home on a new insulin regimen as described above.  DISCHARGE EXAMINATION:  VITAL SIGNS:  Temperature was 97.4, blood pressure 128/64, pulse was 67, respirations 18, oxygen saturation 97%. GENERAL APPEARANCE:  This is a well-nourished gentleman of 87 years in no acute distress. HEENT:  Notable for a small area of bruising on his right frontal skull. Conjunctivae and sclerae were clear. CARDIOVASCULAR:  2+ radial pulses.  Precordium was quiet.  He had an irregularly irregular rate controlled rhythm. PULMONARY:  The patient has good breath sounds with no  increased work of breathing.  No rales, wheezes, or rhonchi are noted. ABDOMEN:  Soft.  No guarding or rebound is noted. NEURO:  The patient is awake, alert.  He is oriented to person, place, time, and context.  Moves all extremities to command.  FINAL LABORATORY:  INR on the fifth day of discharge was 2.13. Hemoglobin A1c from October 31, 2012, was well controlled at 6.5%. Chemistries on October 31, 2012, with a sodium of 137, potassium 4.1, chloride 103, CO2 of 25, BUN of 14, creatinine 1.03.  Glucose was 173. White count was 6900, hemoglobin 11.7 g, platelet count 140,000.  DISPOSITION:  The patient is discharged home.  He will resume all of his home medications as prior to admission except he will  discontinue insulin 70/30 and start a regimen of Levemir 10 units daily and NovoLog FlexPen 6 units before each meal.  He is asked to keep a record of his CBGs.  He will be seen in the office in followup in 7 days.  The patient's condition at time of discharge dictation is stable and improved.     Rosalyn Gess Verlin Uher, MD     MEN/MEDQ  D:  11/01/2012  T:  11/01/2012  Job:  [id number]

## 2012-11-01 NOTE — Progress Notes (Signed)
Stable and ready for discharge,   Dictated # [id number]

## 2012-11-05 ENCOUNTER — Encounter: Payer: Self-pay | Admitting: Internal Medicine

## 2012-11-05 ENCOUNTER — Ambulatory Visit (INDEPENDENT_AMBULATORY_CARE_PROVIDER_SITE_OTHER): Payer: Medicare Other | Admitting: Internal Medicine

## 2012-11-05 VITALS — BP 120/70 | HR 60 | Temp 97.4°F | Ht 75.0 in | Wt 193.4 lb

## 2012-11-05 DIAGNOSIS — S51019A Laceration without foreign body of unspecified elbow, initial encounter: Secondary | ICD-10-CM

## 2012-11-05 DIAGNOSIS — E119 Type 2 diabetes mellitus without complications: Secondary | ICD-10-CM

## 2012-11-05 DIAGNOSIS — R569 Unspecified convulsions: Secondary | ICD-10-CM

## 2012-11-05 DIAGNOSIS — S51009A Unspecified open wound of unspecified elbow, initial encounter: Secondary | ICD-10-CM

## 2012-11-05 NOTE — Progress Notes (Signed)
Subjective:    Patient ID: [name], male    DOB: 1925/08/30, 78 y.o.   MRN: [id number]  HPI  Here to f/u, Dr Debby Bud out of town; suffered low sugar and sz dec 3 on prior regiemn 70/30 insuling (no orals). Pre-dinner (pre siezure) cbg 78 (and took his usual insulin, actually 1 unit less) but unfort sugar dropped despite trying to eat like he normally does.  Seen in the ER, tx for low sugar, then insulin changed to levemir 10 units qhs, and novolog scheduled dosing of 6 units tid with meals.  Since last wk  CBGs have progressively improved to the point now cbg in am < 100, and now in the last day cbg's later in the day have improved to lower 100's as well.  No furher lower sugars or siezure.  Also with skin tear to left elbow - no increased red/tender/swelling/red streaks/fever. Past Medical History  Diagnosis Date  . Acid reflux disease     history of  . H/O: GI bleed   . Paroxysmal atrial fibrillation     chronic anticoag; tikosyn  . Benign prostatic hypertrophy     history of  . Hepatic damage march 2009    retained hepatic stone , intrahepatic duct catheter  . Diabetes mellitus     insulin dep  . CAD (coronary artery disease)   . Hypertension   . 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 Seizures    Past Surgical History  Procedure Date  . Cardioversion   . Cholecystectomy     history of  . Partial gastrectomy     history of  . Shoulder surgery     left shoulder repair with 2 pens inserted  . Whipple procedure     operation  . 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 Inguinal hernia repair     inguinal herniorrhaphy, right... history of  . Appendectomy     history of  . Cardioversion 03/07/2012    Procedure: CARDIOVERSION;  Surgeon: Duke Salvia, MD;  Location: [facility];  Service: Cardiovascular;  Laterality: N/A;    reports that he  quit smoking about 21 years ago. His smoking use included Pipe. He has never used smokeless tobacco. He reports that he does not drink alcohol or use illicit drugs. family history includes Arthritis in his mother and sister; Cancer in his brother; Coronary artery disease in his brother and sister; Coronary artery disease (age of onset:59) in his father; Diabetes in his sister; Fibromyalgia in his sister; Heart disease in his brother and father; Osteoarthritis (age of onset:74) in his mother; and Peripheral vascular disease (age of onset:82) in his sister. Allergies  Allergen Reactions  . Clindamycin   . Oxycodone-Acetaminophen   . Penicillins     REACTION: causes hives   Current Outpatient Prescriptions on File Prior to Visit  Medication Sig Dispense Refill  . amiodarone (PACERONE) 200 MG tablet Take 1 tablet (200 mg total) by mouth daily.  90 tablet  3  . Ascorbic Acid (VITAMIN C) 500 MG tablet Take 500 mg by mouth daily.       . carvedilol (COREG) 3.125 MG tablet Take 1 tablet (3.125 mg total) by mouth 2 (two) times daily with a meal.  180 tablet  3  . CVS GLUCOSAMINE-CHONDROITIN PO Take 1 tablet  by mouth daily. 1200/600 mg      . Emollient (AMLACTIN XL) LOTN Apply 1 application topically daily. Apply lotion to legs      . finasteride (PROSCAR) 5 MG tablet Take 5 mg by mouth daily.      . insulin aspart (NOVOLOG FLEXPEN) 100 UNIT/ML injection Inject 6 Units into the skin 3 (three) times daily before meals.  2 pen  12  . insulin detemir (LEVEMIR FLEXPEN) 100 UNIT/ML injection Inject 10 Units into the skin at bedtime.  3 mL  12  . Insulin Pen Needle (COMFORT EZ PEN NEEDLES) 31G X 5 MM MISC 120 Devices by Does not apply route 4 (four) times daily.  120 each  11  . lipase/protease/amylase (CREON-10/PANCREASE) 12000 UNITS CPEP Take 1 capsule by mouth 3 (three) times daily before meals.      Marland Kitchen lisinopril (PRINIVIL,ZESTRIL) 2.5 MG tablet Take 1 tablet (2.5 mg total) by mouth daily.  90 tablet  3  .  Multiple Vitamins-Minerals (CENTRUM SILVER PO) Take 1 tablet by mouth daily.       . Probiotic Product (PROBIOTIC MULTI-ENZYME PO) TAKE 5 PER DAY WITH MEALS      . Tamsulosin HCl (FLOMAX) 0.4 MG CAPS Take 1 capsule (0.4 mg total) by mouth 2 (two) times daily.  180 capsule  3  . triamcinolone (KENALOG) 0.1 % cream Apply 1 application topically 2 (two) times daily. Apply to left leg      . warfarin (COUMADIN) 5 MG tablet Take 2.5-7.5 mg by mouth daily. Friday 7.5mg ; rest of the week 2.5mg        Review of Systems  Constitutional: Negative for diaphoresis and unexpected weight change.  HENT: Negative for tinnitus.   Eyes: Negative for photophobia and visual disturbance.  Respiratory: Negative for choking and stridor.   Gastrointestinal: Negative for vomiting and blood in stool.  Genitourinary: Negative for hematuria and decreased urine volume.  Musculoskeletal: Negative for gait problem.  Skin: Negative for color change and wound.  Neurological: Negative for tremors and numbness.  Psychiatric/Behavioral: Negative for decreased concentration. The patient is not hyperactive.       Objective:   Physical Exam BP 120/70  Pulse 60  Temp 97.4 F (36.3 C) (Oral)  Ht 6\' 3"  (1.905 m)  Wt 193 lb 6 oz (87.714 kg)  BMI 24.17 kg/m2  SpO2 98% Physical Exam  VS noted Constitutional: Pt appears well-developed and well-nourished.  HENT: Head: Normocephalic.  Right Ear: External ear normal.  Left Ear: External ear normal.  Eyes: Conjunctivae and EOM are normal. Pupils are equal, round, and reactive to light.  Neck: Normal range of motion. Neck supple.  Cardiovascular: Normal rate and regular rhythm.   Pulmonary/Chest: Effort normal and breath sounds normal.  Neurological: Pt is alert. Not confused  Skin: Skin is warm. No erythema. left elbow skin tear without bleeding or red/tender/swelling without drainage or red streaks Psychiatric: Pt behavior is normal. Thought content normal.     Assessment &  Plan:

## 2012-11-05 NOTE — Patient Instructions (Addendum)
Please change the dressing daily to the left elbow with neosporin and gauze until healed OK to continue your same insulin regimen for now Please continue to monitor your sugars as you are doing, and send the results to myself or Dr Debby Bud by [facility] on Thursday or Friday later this week Please call or use MyChart if you have persistent sugars less than 100 Continue all other medications as before Please have the pharmacy call with any other refills you may need. Please see Dr Debby Bud as you have planned Dec 17

## 2012-11-05 NOTE — Assessment & Plan Note (Signed)
With improvement over the weekend with cbg;s in the last 24 hrs in low 100's, no low sugars or dz, o/w asympt, ok to cont meds as is, would not increase levemir or novolog for now, but should cont to monitor CBG's and let us know in 3-4 days about how cbg's are doing; may need increased novolog but not clear at this time

## 2012-11-05 NOTE — Assessment & Plan Note (Signed)
No further,  to f/u any worsening symptoms or concerns

## 2012-11-05 NOTE — Assessment & Plan Note (Signed)
Stable, no evidence for cellulitis, d/w pt - reassured, for neosporin/gauze until healed

## 2012-11-08 ENCOUNTER — Telehealth: Payer: Self-pay | Admitting: Internal Medicine

## 2012-11-08 NOTE — Telephone Encounter (Signed)
Patient Information:  Caller Name: Harriett Sine  Phone: [phone]  Patient: [name], [name]  Gender: Male  DOB: October 10, 1925  Age: 77 Years  PCP: Illene Regulus (Adults only)  Office Follow Up:  Does the office need to follow up with this patient?: Yes  Instructions For The Office: FOLLOW UP W/ PT REGARDING INSULIN  RN Note:  Pt switched to Novolog flexpen and levmeir flexpen on 12-5, since switch from [place] Pt's blood sugar has been dropping. Before Novolog,sticks range from 94 - 111, Pt takes 6 units has breakfast FSBS ranges from 171 - 189, another 6 units after Lunch FSBS 90 - 171, until today, 12-12, FSBS was 47,  Pt is currently asymptomatic.  FSBS 116 currently. Pt seen on 12-9, seen by Dr Jonny Ruiz, Pt would like to know if he should continue the 6 units.  CALL PT BACK ON [phone].  Symptoms  Reason For Call & Symptoms: Finger Stick 47 at 1400 on 12-12.  After OJ and candy, FSBS 116 at 1600  Reviewed Health History In EMR: Yes  Reviewed Medications In EMR: Yes  Reviewed Allergies In EMR: Yes  Reviewed Surgeries / Procedures: Yes  Date of Onset of Symptoms: 11/01/2012  Guideline(s) Used:  Diabetes - Low Blood Sugar  Disposition Per Guideline:   Discuss with PCP and Callback by Nurse Today  Reason For Disposition Reached:   Caller has NON-URGENT medication question about med that PCP prescribed and triager unable to answer question  Advice Given:  N/A

## 2012-11-08 NOTE — Telephone Encounter (Signed)
Please read call a nurse note below. Please advise accordingly.

## 2012-11-08 NOTE — Telephone Encounter (Signed)
Called patient - two episodes of low CBG. Plan - continue present regimen and continue to keep CBG record. Report back next week.

## 2012-11-13 ENCOUNTER — Ambulatory Visit: Payer: Medicare Other | Admitting: Internal Medicine

## 2012-11-13 ENCOUNTER — Ambulatory Visit (INDEPENDENT_AMBULATORY_CARE_PROVIDER_SITE_OTHER): Payer: Medicare Other | Admitting: Internal Medicine

## 2012-11-13 ENCOUNTER — Encounter: Payer: Self-pay | Admitting: Internal Medicine

## 2012-11-13 VITALS — BP 122/64 | HR 58 | Temp 97.1°F | Resp 10 | Wt 192.1 lb

## 2012-11-13 DIAGNOSIS — E119 Type 2 diabetes mellitus without complications: Secondary | ICD-10-CM

## 2012-11-13 MED ORDER — INSULIN ASPART 100 UNIT/ML ~~LOC~~ SOLN: 6.0000 [IU] | Freq: Three times a day (TID) | SUBCUTANEOUS | Status: DC

## 2012-11-13 MED ORDER — INSULIN DETEMIR 100 UNIT/ML ~~LOC~~ SOLN: 10.0000 [IU] | Freq: Every day | SUBCUTANEOUS | Status: DC

## 2012-11-13 NOTE — Patient Instructions (Addendum)
Your blood sugar levels are fine. Remember we are looking to keep you from having low blood sugars and to keep from having very high blood sugars (greater than 400). The concern is not for long - term end organ damage.  Remember to be sure to eat a well balanced diet, including carbohydrates if you are taking insulin.

## 2012-11-14 NOTE — Progress Notes (Signed)
  Subjective:    Patient ID: [name], male    DOB: Jan 06, 1925, 78 y.o.   MRN: [id number]  HPI Mr. Berkey presents for follow up of insulin dependent diabetes. He was recently hospitalized for hypoglycemia with seizure. His regimen was changed from 70/30 bid to basal insulin and AC insulin. He was seen by Dr. Jonny Ruiz Dec 9th. He has been keeping detailed record of fasting CBG and post-prandial CBG. This data is reviewed: AM CBGs are at goal in the 100-130 range; post-prandial CBGs are 100-200 range. He did have two hypoglycemic episodes post-prandial to a low of 47. He does not report any causative factors associated with these episodes.  PMH, FamHx and SocHx reviewed for any changes and relevance. Current Outpatient Prescriptions on File Prior to Visit  Medication Sig Dispense Refill  . amiodarone (PACERONE) 200 MG tablet Take 1 tablet (200 mg total) by mouth daily.  90 tablet  3  . Ascorbic Acid (VITAMIN C) 500 MG tablet Take 500 mg by mouth daily.       . carvedilol (COREG) 3.125 MG tablet Take 1 tablet (3.125 mg total) by mouth 2 (two) times daily with a meal.  180 tablet  3  . [facility] GLUCOSAMINE-CHONDROITIN PO Take 1 tablet by mouth daily. 1200/600 mg      . Emollient (AMLACTIN XL) LOTN Apply 1 application topically daily. Apply lotion to legs      . finasteride (PROSCAR) 5 MG tablet Take 5 mg by mouth daily.      . insulin aspart (NOVOLOG FLEXPEN) 100 UNIT/ML injection Inject 6 Units into the skin 3 (three) times daily before meals.  6 pen  3  . insulin detemir (LEVEMIR FLEXPEN) 100 UNIT/ML injection Inject 10 Units into the skin at bedtime.  9 mL  3  . Insulin Pen Needle (COMFORT EZ PEN NEEDLES) 31G X 5 MM MISC 120 Devices by Does not apply route 4 (four) times daily.  120 each  11  . lipase/protease/amylase (CREON-10/PANCREASE) 12000 UNITS CPEP Take 1 capsule by mouth 3 (three) times daily before meals.      Marland Kitchen lisinopril (PRINIVIL,ZESTRIL) 2.5 MG tablet Take 1 tablet (2.5 mg total) by mouth  daily.  90 tablet  3  . Multiple Vitamins-Minerals (CENTRUM SILVER PO) Take 1 tablet by mouth daily.       . Probiotic Product (PROBIOTIC MULTI-ENZYME PO) TAKE 5 PER DAY WITH MEALS      . Tamsulosin HCl (FLOMAX) 0.4 MG CAPS Take 1 capsule (0.4 mg total) by mouth 2 (two) times daily.  180 capsule  3  . warfarin (COUMADIN) 5 MG tablet Take 2.5-7.5 mg by mouth daily. Friday 7.5mg ; rest of the week 2.5mg       . triamcinolone (KENALOG) 0.1 % cream Apply 1 application topically 2 (two) times daily. Apply to left leg          Review of Systems System review is negative for any constitutional, cardiac, pulmonary, GI or neuro symptoms or complaints other than as described in the HPI.     Objective:   Physical Exam Filed Vitals:   11/13/12 1224  BP: 122/64  Pulse: 58  Temp: 97.1 F (36.2 C)  Resp: 10   Cor- 2+ radial pulse PUlm - normal respirations Neuro - A&O x 3       Assessment & Plan:

## 2012-11-14 NOTE — Assessment & Plan Note (Signed)
Patient is doing OK on current regimen. He is concerns about overall elevation in post-prandial CBGs although he has had two hypoglycemic episodes. Discussed the current approach and reviewed current theory that in the elderly tight control carries more risk than benefit.  Plan Continue on the present regimen  A1C in March  Call for problems.

## 2012-11-27 LAB — CULTURE, BLOOD (ROUTINE X 2): Culture: NO GROWTH

## 2012-12-07 ENCOUNTER — Other Ambulatory Visit: Payer: Self-pay | Admitting: *Deleted

## 2012-12-07 MED ORDER — INSULIN PEN NEEDLE 31G X 5 MM MISC: 120.0000 | Freq: Four times a day (QID) | Status: DC

## 2012-12-10 ENCOUNTER — Ambulatory Visit (INDEPENDENT_AMBULATORY_CARE_PROVIDER_SITE_OTHER): Payer: Medicare Other | Admitting: *Deleted

## 2012-12-10 DIAGNOSIS — I4891 Unspecified atrial fibrillation: Secondary | ICD-10-CM

## 2012-12-10 LAB — POCT INR: INR: 3.5

## 2012-12-24 ENCOUNTER — Ambulatory Visit (INDEPENDENT_AMBULATORY_CARE_PROVIDER_SITE_OTHER): Payer: Medicare Other | Admitting: *Deleted

## 2012-12-24 DIAGNOSIS — I4891 Unspecified atrial fibrillation: Secondary | ICD-10-CM

## 2012-12-24 LAB — POCT INR: INR: 2.7

## 2013-01-12 ENCOUNTER — Other Ambulatory Visit: Payer: Self-pay

## 2013-01-21 ENCOUNTER — Ambulatory Visit (INDEPENDENT_AMBULATORY_CARE_PROVIDER_SITE_OTHER): Payer: Medicare Other | Admitting: Pharmacist

## 2013-01-21 DIAGNOSIS — I4891 Unspecified atrial fibrillation: Secondary | ICD-10-CM

## 2013-01-28 ENCOUNTER — Ambulatory Visit (INDEPENDENT_AMBULATORY_CARE_PROVIDER_SITE_OTHER): Payer: Medicare Other | Admitting: *Deleted

## 2013-01-28 DIAGNOSIS — I4891 Unspecified atrial fibrillation: Secondary | ICD-10-CM

## 2013-02-05 ENCOUNTER — Encounter: Payer: Self-pay | Admitting: Internal Medicine

## 2013-02-05 ENCOUNTER — Ambulatory Visit (INDEPENDENT_AMBULATORY_CARE_PROVIDER_SITE_OTHER): Payer: Medicare Other | Admitting: Internal Medicine

## 2013-02-05 VITALS — BP 116/52 | HR 59 | Ht 75.0 in | Wt 197.8 lb

## 2013-02-05 DIAGNOSIS — I472 Ventricular tachycardia, unspecified: Secondary | ICD-10-CM

## 2013-02-05 DIAGNOSIS — I4891 Unspecified atrial fibrillation: Secondary | ICD-10-CM

## 2013-02-05 DIAGNOSIS — I2510 Atherosclerotic heart disease of native coronary artery without angina pectoris: Secondary | ICD-10-CM

## 2013-02-05 DIAGNOSIS — R42 Dizziness and giddiness: Secondary | ICD-10-CM

## 2013-02-05 MED ORDER — LISINOPRIL 2.5 MG PO TABS: 2.5000 mg | ORAL_TABLET | Freq: Every day | ORAL | Status: DC

## 2013-02-05 MED ORDER — LISINOPRIL 2.5 MG PO TABS: 2.5000 mg | ORAL_TABLET | Freq: Every day | ORAL | Status: AC

## 2013-02-05 MED ORDER — CARVEDILOL 3.125 MG PO TABS: 3.1250 mg | ORAL_TABLET | Freq: Two times a day (BID) | ORAL | Status: DC

## 2013-02-05 MED ORDER — CARVEDILOL 3.125 MG PO TABS: 3.1250 mg | ORAL_TABLET | Freq: Two times a day (BID) | ORAL | Status: AC

## 2013-02-05 NOTE — Patient Instructions (Addendum)
Your physician recommends that you return for lab work today: Liver, TSH  Your physician has recommended you make the following change in your medication: Decrease Amiodarone 200mg  ,1 tablet by mouth five days a week.  Your physician wants you to follow-up in: 6 months with Dr. Graciela Husbands. You will receive a reminder letter in the mail two months in advance. If you don't receive a letter, please call our office to schedule the follow-up appointment.

## 2013-02-05 NOTE — Assessment & Plan Note (Signed)
Holding sinus rhythm on amiodarone. We'll decrease it from 1400--1000 mg a week. We'll check a surveillance laboratories as they were last checked 5/13. He has not had any cough. GI symptoms.

## 2013-02-05 NOTE — Progress Notes (Signed)
Patient Care Team: Jacques Navy, MD as PCP - General Duke Salvia, MD as Consulting Physician (Cardiology) Eulas Post, MD as Consulting Physician (Orthopedic Surgery)   HPI  [name] is a 79 y.o. male seen in followup for paroxysmal atrial fibrillation for which he  Took Tikosyn but now is on amiodarone and tolerating well without recurrent afib    notwithstanding continuing his amiodarone.  We undertook cardioversion and he is holding sinus rhythm. His exercise tolerance is somewhat better. I esophagitis the on he is in and evaluated and noted that a  He has had complaints recently of more fatigue over the last 3 or 4 months. There is nothing specific that they identified. His TSH was for 3 months ago.    They are moving into [place] and are very excited  Past Medical History  Diagnosis Date  . Acid reflux disease     history of  . H/O: GI bleed   . Paroxysmal atrial fibrillation     chronic anticoag; tikosyn  . Benign prostatic hypertrophy     history of  . Hepatic damage march 2009    retained hepatic stone , intrahepatic duct catheter  . Diabetes mellitus     insulin dep  . CAD (coronary artery disease)   . Hypertension   . 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 Seizures     Past Surgical History  Procedure Laterality Date  . Cardioversion    . Cholecystectomy      history of  . Partial gastrectomy      history of  . Shoulder surgery      left shoulder repair with 2 pens inserted  . Whipple procedure      operation  . Sarcoma removal from rigth tricep    . Inguinal hernia repair      inguinal herniorrhaphy, right... history of  . Appendectomy      history of  . Cardioversion  03/07/2012    Procedure: CARDIOVERSION;  Surgeon: Duke Salvia, MD;  Location: [facility] OR;  Service: Cardiovascular;  Laterality:  N/A;    Current Outpatient Prescriptions  Medication Sig Dispense Refill  . amiodarone (PACERONE) 200 MG tablet Take 1 tablet (200 mg total) by mouth daily.  90 tablet  3  . Ascorbic Acid (VITAMIN C) 500 MG tablet Take 500 mg by mouth daily.       . carvedilol (COREG) 3.125 MG tablet Take 1 tablet (3.125 mg total) by mouth 2 (two) times daily with a meal.  180 tablet  3  . [facility]S GLUCOSAMINE-CHONDROITIN PO Take 1 tablet by mouth daily. 1200/600 mg      . Emollient (AMLACTIN XL) LOTN Apply 1 application topically daily. Apply lotion to legs      . finasteride (PROSCAR) 5 MG tablet Take 5 mg by mouth daily.      . insulin aspart (NOVOLOG FLEXPEN) 100 UNIT/ML injection Inject 6 Units into the skin 3 (three) times daily before meals.  6 pen  3  . insulin detemir (LEVEMIR FLEXPEN) 100 UNIT/ML injection Inject 10 Units into the skin at bedtime.  9 mL  3  . Insulin Pen Needle (COMFORT EZ PEN NEEDLES) 31G X 5 MM MISC 120 Devices by Does not apply route 4 (four) times daily.  120 each  11  .  lipase/protease/amylase (CREON-10/PANCREASE) 12000 UNITS CPEP Take 1 capsule by mouth 3 (three) times daily before meals.      Marland Kitchen lisinopril (PRINIVIL,ZESTRIL) 2.5 MG tablet Take 1 tablet (2.5 mg total) by mouth daily.  90 tablet  3  . Multiple Vitamins-Minerals (CENTRUM SILVER PO) Take 1 tablet by mouth daily.       . Probiotic Product (PROBIOTIC MULTI-ENZYME PO) TAKE 5 PER DAY WITH MEALS      . Tamsulosin HCl (FLOMAX) 0.4 MG CAPS Take 1 capsule (0.4 mg total) by mouth 2 (two) times daily.  180 capsule  3  . triamcinolone (KENALOG) 0.1 % cream Apply 1 application topically 2 (two) times daily. Apply to left leg      . warfarin (COUMADIN) 5 MG tablet Take 2.5-7.5 mg by mouth daily. Friday 7.5mg ; rest of the week 2.5mg        No current facility-administered medications for this visit.    Allergies  Allergen Reactions  . Clindamycin   . Oxycodone-Acetaminophen   . Penicillins     REACTION: causes hives     Review of Systems negative except from HPI and PMH  Physical Exam BP 116/52  Pulse 59  Ht 6\' 3"  (1.905 m)  Wt 197 lb 12.8 oz (89.721 kg)  BMI 24.72 kg/m2 Well developed and well nourished in no acute distress HENT normal E scleral and icterus clear Neck Supple JVP flat; carotids brisk and full Clear to ausculation  Regular rate and rhythm, no murmurs gallops or rub Soft with active bowel sounds No clubbing cyanosis  Edema or right arm Alert and oriented, grossly normal motor and sensory function Skin Warm and Dry    Assessment and  Plan

## 2013-02-05 NOTE — Assessment & Plan Note (Signed)
No known recurrences

## 2013-02-05 NOTE — Assessment & Plan Note (Signed)
Stable. We'll continue current medications 

## 2013-02-06 LAB — HEPATIC FUNCTION PANEL
ALT: 29 U/L (ref 0–53)
Albumin: 3.5 g/dL (ref 3.5–5.2)
Bilirubin, Direct: 0.1 mg/dL (ref 0.0–0.3)
Total Protein: 6.9 g/dL (ref 6.0–8.3)

## 2013-02-06 LAB — TSH: TSH: 6.5 u[IU]/mL — ABNORMAL HIGH (ref 0.35–5.50)

## 2013-02-11 ENCOUNTER — Other Ambulatory Visit: Payer: Self-pay | Admitting: *Deleted

## 2013-02-11 DIAGNOSIS — I4891 Unspecified atrial fibrillation: Secondary | ICD-10-CM

## 2013-02-11 DIAGNOSIS — R7989 Other specified abnormal findings of blood chemistry: Secondary | ICD-10-CM

## 2013-02-18 ENCOUNTER — Other Ambulatory Visit: Payer: Self-pay | Admitting: *Deleted

## 2013-02-18 ENCOUNTER — Ambulatory Visit (INDEPENDENT_AMBULATORY_CARE_PROVIDER_SITE_OTHER): Payer: Medicare Other | Admitting: *Deleted

## 2013-02-18 DIAGNOSIS — I4891 Unspecified atrial fibrillation: Secondary | ICD-10-CM

## 2013-02-18 MED ORDER — AMIODARONE HCL 200 MG PO TABS: 200.0000 mg | ORAL_TABLET | ORAL | Status: DC

## 2013-03-18 ENCOUNTER — Encounter: Payer: Self-pay | Admitting: *Deleted

## 2013-03-18 ENCOUNTER — Ambulatory Visit (INDEPENDENT_AMBULATORY_CARE_PROVIDER_SITE_OTHER): Payer: Medicare Other | Admitting: *Deleted

## 2013-03-18 DIAGNOSIS — I4891 Unspecified atrial fibrillation: Secondary | ICD-10-CM

## 2013-04-01 ENCOUNTER — Ambulatory Visit (INDEPENDENT_AMBULATORY_CARE_PROVIDER_SITE_OTHER): Payer: Medicare Other | Admitting: Pharmacist

## 2013-04-01 DIAGNOSIS — I4891 Unspecified atrial fibrillation: Secondary | ICD-10-CM

## 2013-04-01 LAB — POCT INR: INR: 3.5

## 2013-04-10 ENCOUNTER — Other Ambulatory Visit (INDEPENDENT_AMBULATORY_CARE_PROVIDER_SITE_OTHER): Payer: Medicare Other

## 2013-04-10 DIAGNOSIS — I4891 Unspecified atrial fibrillation: Secondary | ICD-10-CM

## 2013-04-10 DIAGNOSIS — R7989 Other specified abnormal findings of blood chemistry: Secondary | ICD-10-CM

## 2013-04-10 LAB — TSH: TSH: 6.52 u[IU]/mL — ABNORMAL HIGH (ref 0.35–5.50)

## 2013-04-15 ENCOUNTER — Other Ambulatory Visit: Payer: Self-pay | Admitting: *Deleted

## 2013-04-15 ENCOUNTER — Ambulatory Visit (INDEPENDENT_AMBULATORY_CARE_PROVIDER_SITE_OTHER): Payer: Medicare Other | Admitting: *Deleted

## 2013-04-15 DIAGNOSIS — E0590 Thyrotoxicosis, unspecified without thyrotoxic crisis or storm: Secondary | ICD-10-CM

## 2013-04-15 DIAGNOSIS — I4891 Unspecified atrial fibrillation: Secondary | ICD-10-CM

## 2013-04-15 LAB — POCT INR: INR: 2.2

## 2013-05-02 ENCOUNTER — Other Ambulatory Visit: Payer: Self-pay | Admitting: Internal Medicine

## 2013-05-06 ENCOUNTER — Ambulatory Visit (INDEPENDENT_AMBULATORY_CARE_PROVIDER_SITE_OTHER): Payer: Medicare Other | Admitting: *Deleted

## 2013-05-06 DIAGNOSIS — I4891 Unspecified atrial fibrillation: Secondary | ICD-10-CM

## 2013-05-06 LAB — POCT INR: INR: 3.4

## 2013-05-20 ENCOUNTER — Other Ambulatory Visit (INDEPENDENT_AMBULATORY_CARE_PROVIDER_SITE_OTHER): Payer: Medicare Other

## 2013-05-20 ENCOUNTER — Encounter: Payer: Self-pay | Admitting: Internal Medicine

## 2013-05-20 ENCOUNTER — Ambulatory Visit (INDEPENDENT_AMBULATORY_CARE_PROVIDER_SITE_OTHER): Payer: Medicare Other | Admitting: Internal Medicine

## 2013-05-20 VITALS — BP 120/60 | HR 63 | Temp 97.4°F | Resp 12 | Ht 74.0 in | Wt 195.0 lb

## 2013-05-20 DIAGNOSIS — E119 Type 2 diabetes mellitus without complications: Secondary | ICD-10-CM

## 2013-05-20 LAB — COMPREHENSIVE METABOLIC PANEL
BUN: 17 mg/dL (ref 6–23)
CO2: 29 mEq/L (ref 19–32)
Calcium: 8.8 mg/dL (ref 8.4–10.5)
Chloride: 103 mEq/L (ref 96–112)
Creatinine, Ser: 1.1 mg/dL (ref 0.4–1.5)
GFR: 64.74 mL/min (ref >60.00)

## 2013-05-20 MED ORDER — TAMSULOSIN HCL 0.4 MG PO CAPS: 0.4000 mg | ORAL_CAPSULE | Freq: Two times a day (BID) | ORAL | Status: DC

## 2013-05-20 MED ORDER — FINASTERIDE 5 MG PO TABS: 5.0000 mg | ORAL_TABLET | Freq: Every day | ORAL | Status: DC

## 2013-05-20 NOTE — Progress Notes (Signed)
Subjective:    Patient ID: [name], male    DOB: [birth year], 79 y.o.   MRN: [id number]  HPI Follow up for diabetes: he has been doing well with basal insulin, Levemir, and Novolog before meals 6 units, 5 units, 6 units. His home kept record has been excellent in the AM, some after meal excursions but only a fell hypoglycemic episodes. He has no other complaints.  PMH, FamHx and SocHx reviewed for any changes and relevance. Current Outpatient Prescriptions on File Prior to Visit  Medication Sig Dispense Refill  . amiodarone (PACERONE) 200 MG tablet Take 1 tablet (200 mg total) by mouth as directed. Take 1 tablet by mouth five day a week.  90 tablet  3  . Ascorbic Acid (VITAMIN C) 500 MG tablet Take 500 mg by mouth daily.       . carvedilol (COREG) 3.125 MG tablet Take 1 tablet (3.125 mg total) by mouth 2 (two) times daily with a meal.  180 tablet  3  . [facility]S GLUCOSAMINE-CHONDROITIN PO Take 1 tablet by mouth daily. 1200/600 mg      . Emollient (AMLACTIN XL) LOTN Apply 1 application topically daily. Apply lotion to legs      . insulin aspart (NOVOLOG FLEXPEN) 100 UNIT/ML injection Inject 6 Units into the skin 3 (three) times daily before meals.  6 pen  3  . insulin detemir (LEVEMIR FLEXPEN) 100 UNIT/ML injection Inject 10 Units into the skin at bedtime.  9 mL  3  . Insulin Pen Needle (COMFORT EZ PEN NEEDLES) 31G X 5 MM MISC 120 Devices by Does not apply route 4 (four) times daily.  120 each  11  . lipase/protease/amylase (CREON-10/PANCREASE) 12000 UNITS CPEP Take 1 capsule by mouth 3 (three) times daily before meals.      Marland Kitchen lisinopril (PRINIVIL,ZESTRIL) 2.5 MG tablet Take 1 tablet (2.5 mg total) by mouth daily.  90 tablet  3  . Multiple Vitamins-Minerals (CENTRUM SILVER PO) Take 1 tablet by mouth daily.       . Probiotic Product (PROBIOTIC MULTI-ENZYME PO) TAKE 5 PER DAY WITH MEALS      . warfarin (COUMADIN) 5 MG tablet Take 2.5-7.5 mg by mouth daily. Patient states he takes 5 mg daily        No current facility-administered medications on file prior to visit.      Review of Systems System review is negative for any constitutional, cardiac, pulmonary, GI or neuro symptoms or complaints other than as described in the HPI.     Objective:   Physical Exam Filed Vitals:   05/20/13 1346  BP: 120/60  Pulse: 63  Temp: 97.4 F (36.3 C)  Resp: 12   gen'l - WNWD white man in no distress Cor- RRR Pulm - normal respirations.  Recent Results (from the past 2160 hour(s))  POCT INR     Status: None   Collection Time    03/18/13 11:15 AM      Result Value Range   INR 3.9    POCT INR     Status: None   Collection Time    04/01/13 10:54 AM      Result Value Range   INR 3.5    TSH     Status: Abnormal   Collection Time    04/10/13 10:48 AM      Result Value Range   TSH 6.52 (*) 0.35 - 5.50 uIU/mL  POCT INR     Status: None   Collection Time  04/15/13 11:07 AM      Result Value Range   INR 2.2    POCT INR     Status: None   Collection Time    05/06/13 10:49 AM      Result Value Range   INR 3.4    HEMOGLOBIN A1C     Status: Abnormal   Collection Time    05/20/13  2:29 PM      Result Value Range   Hemoglobin A1C 6.7 (*) 4.6 - 6.5 %   Comment: Glycemic Control Guidelines for People with Diabetes:Non Diabetic:  <6%Goal of Therapy: <7%Additional Action Suggested:  >8%   COMPREHENSIVE METABOLIC PANEL     Status: Abnormal   Collection Time    05/20/13  2:29 PM      Result Value Range   Sodium 138  135 - 145 mEq/L   Potassium 4.6  3.5 - 5.1 mEq/L   Chloride 103  96 - 112 mEq/L   CO2 29  19 - 32 mEq/L   Glucose, Bld 156 (*) 70 - 99 mg/dL   BUN 17  6 - 23 mg/dL   Creatinine, Ser 1.1  0.4 - 1.5 mg/dL   Total Bilirubin 0.7  0.3 - 1.2 mg/dL   Alkaline Phosphatase 134 (*) 39 - 117 U/L   AST 30  0 - 37 U/L   ALT 29  0 - 53 U/L   Total Protein 7.3  6.0 - 8.3 g/dL   Albumin 3.6  3.5 - 5.2 g/dL   Calcium 8.8  8.4 - 40.9 mg/dL   GFR 81.19  >14.78 mL/min         Assessment & Plan:

## 2013-05-20 NOTE — Patient Instructions (Addendum)
Thanks for coming in.  Your home blood sugar readings are in the main very good. Plan - will check an A1C today along with a basic metabolic panel. Results will be posted to MyChart.

## 2013-05-21 NOTE — Assessment & Plan Note (Signed)
Home CBG record reveals good AM control and some variability in post-prandial readings. Only a few lows, more highs. Plan A1C with recommendations to follow.  Addendum: A1C  6.7% - reported by MyChart - will continue present regimen

## 2013-05-27 ENCOUNTER — Ambulatory Visit (INDEPENDENT_AMBULATORY_CARE_PROVIDER_SITE_OTHER): Payer: Medicare Other | Admitting: *Deleted

## 2013-05-27 DIAGNOSIS — I4891 Unspecified atrial fibrillation: Secondary | ICD-10-CM

## 2013-05-27 LAB — POCT INR: INR: 2.8

## 2013-06-10 ENCOUNTER — Other Ambulatory Visit (INDEPENDENT_AMBULATORY_CARE_PROVIDER_SITE_OTHER): Payer: Medicare Other

## 2013-06-10 DIAGNOSIS — E0590 Thyrotoxicosis, unspecified without thyrotoxic crisis or storm: Secondary | ICD-10-CM

## 2013-06-10 LAB — T3, FREE: T3, Free: 2.5 pg/mL (ref 2.3–4.2)

## 2013-06-10 LAB — TSH: TSH: 7.77 u[IU]/mL — ABNORMAL HIGH (ref 0.35–5.50)

## 2013-06-22 ENCOUNTER — Emergency Department (HOSPITAL_COMMUNITY): Payer: Medicare Other

## 2013-06-22 ENCOUNTER — Inpatient Hospital Stay (HOSPITAL_COMMUNITY)
Admission: EM | Admit: 2013-06-22 | Discharge: 2013-06-24 | DRG: 392 | Disposition: A | Discharge status: Home / Self Care | Payer: Medicare Other | Attending: Internal Medicine | Admitting: Internal Medicine

## 2013-06-22 ENCOUNTER — Encounter (HOSPITAL_COMMUNITY): Payer: Self-pay | Admitting: Emergency Medicine

## 2013-06-22 ENCOUNTER — Telehealth: Payer: Self-pay | Admitting: Physician Assistant

## 2013-06-22 DIAGNOSIS — I2510 Atherosclerotic heart disease of native coronary artery without angina pectoris: Secondary | ICD-10-CM

## 2013-06-22 DIAGNOSIS — I4891 Unspecified atrial fibrillation: Secondary | ICD-10-CM

## 2013-06-22 DIAGNOSIS — Z79899 Other long term (current) drug therapy: Secondary | ICD-10-CM

## 2013-06-22 DIAGNOSIS — E118 Type 2 diabetes mellitus with unspecified complications: Secondary | ICD-10-CM | POA: Diagnosis present

## 2013-06-22 DIAGNOSIS — I10 Essential (primary) hypertension: Secondary | ICD-10-CM | POA: Diagnosis present

## 2013-06-22 DIAGNOSIS — N400 Enlarged prostate without lower urinary tract symptoms: Secondary | ICD-10-CM | POA: Diagnosis present

## 2013-06-22 DIAGNOSIS — Z8509 Personal history of malignant neoplasm of other digestive organs: Secondary | ICD-10-CM

## 2013-06-22 DIAGNOSIS — Z87891 Personal history of nicotine dependence: Secondary | ICD-10-CM

## 2013-06-22 DIAGNOSIS — Z794 Long term (current) use of insulin: Secondary | ICD-10-CM

## 2013-06-22 DIAGNOSIS — C248 Malignant neoplasm of overlapping sites of biliary tract: Secondary | ICD-10-CM | POA: Diagnosis present

## 2013-06-22 DIAGNOSIS — E119 Type 2 diabetes mellitus without complications: Secondary | ICD-10-CM

## 2013-06-22 DIAGNOSIS — R1012 Left upper quadrant pain: Secondary | ICD-10-CM | POA: Diagnosis present

## 2013-06-22 DIAGNOSIS — E279 Disorder of adrenal gland, unspecified: Secondary | ICD-10-CM | POA: Diagnosis present

## 2013-06-22 DIAGNOSIS — G40909 Epilepsy, unspecified, not intractable, without status epilepticus: Secondary | ICD-10-CM | POA: Diagnosis present

## 2013-06-22 DIAGNOSIS — D3502 Benign neoplasm of left adrenal gland: Secondary | ICD-10-CM

## 2013-06-22 DIAGNOSIS — I4819 Other persistent atrial fibrillation: Secondary | ICD-10-CM | POA: Diagnosis present

## 2013-06-22 DIAGNOSIS — Z7901 Long term (current) use of anticoagulants: Secondary | ICD-10-CM

## 2013-06-22 DIAGNOSIS — R7989 Other specified abnormal findings of blood chemistry: Secondary | ICD-10-CM | POA: Diagnosis present

## 2013-06-22 DIAGNOSIS — Z9889 Other specified postprocedural states: Secondary | ICD-10-CM

## 2013-06-22 DIAGNOSIS — K5730 Diverticulosis of large intestine without perforation or abscess without bleeding: Secondary | ICD-10-CM | POA: Diagnosis present

## 2013-06-22 LAB — COMPREHENSIVE METABOLIC PANEL
AST: 25 U/L (ref 0–37)
Albumin: 3.4 g/dL — ABNORMAL LOW (ref 3.5–5.2)
Alkaline Phosphatase: 151 U/L — ABNORMAL HIGH (ref 39–117)
Chloride: 101 mEq/L (ref 96–112)
Creatinine, Ser: 1.13 mg/dL (ref 0.50–1.35)
Potassium: 4.5 mEq/L (ref 3.5–5.1)
Total Bilirubin: 0.4 mg/dL (ref 0.3–1.2)

## 2013-06-22 LAB — CBC WITH DIFFERENTIAL/PLATELET
Basophils Absolute: 0 10*3/uL (ref 0.0–0.1)
Basophils Relative: 0 % (ref 0–1)
MCHC: 34.4 g/dL (ref 30.0–36.0)
Neutro Abs: 4.7 10*3/uL (ref 1.7–7.7)
Neutrophils Relative %: 64 % (ref 43–77)
RDW: 13.5 % (ref 11.5–15.5)

## 2013-06-22 LAB — TROPONIN I
Troponin I: 0.4 ng/mL (ref <0.30)
Troponin I: 0.33 ng/mL (ref <0.30)
Troponin I: 0.33 ng/mL (ref <0.30)

## 2013-06-22 LAB — GLUCOSE, CAPILLARY: Glucose-Capillary: 135 mg/dL — ABNORMAL HIGH (ref 70–99)

## 2013-06-22 LAB — LACTIC ACID, PLASMA: Lactic Acid, Venous: 1.3 mmol/L (ref 0.5–2.2)

## 2013-06-22 LAB — URINE MICROSCOPIC-ADD ON

## 2013-06-22 LAB — URINALYSIS, ROUTINE W REFLEX MICROSCOPIC
Glucose, UA: NEGATIVE mg/dL
Protein, ur: NEGATIVE mg/dL
pH: 6.5 (ref 5.0–8.0)

## 2013-06-22 LAB — LIPASE, BLOOD: Lipase: 7 U/L — ABNORMAL LOW (ref 11–59)

## 2013-06-22 LAB — PROTIME-INR
INR: 2.45 — ABNORMAL HIGH (ref 0.00–1.49)
Prothrombin Time: 25.8 seconds — ABNORMAL HIGH (ref 11.6–15.2)

## 2013-06-22 MED ORDER — WARFARIN - PHARMACIST DOSING INPATIENT: Freq: Every day | Status: DC

## 2013-06-22 MED ORDER — SODIUM CHLORIDE 0.9 % IV SOLN
INTRAVENOUS | Status: DC
  Administered 2013-06-22 – 2013-06-23 (×3): via INTRAVENOUS

## 2013-06-22 MED ORDER — WARFARIN SODIUM 5 MG PO TABS
5.0000 mg | ORAL_TABLET | Freq: Every day | ORAL | Status: DC
  Administered 2013-06-22: 5 mg via ORAL
  Filled 2013-06-22 (×3): qty 1

## 2013-06-22 MED ORDER — FENTANYL CITRATE 0.05 MG/ML IJ SOLN
50.0000 ug | Freq: Once | INTRAMUSCULAR | Status: DC
  Filled 2013-06-22: qty 2

## 2013-06-22 MED ORDER — PANCRELIPASE (LIP-PROT-AMYL) 12000-38000 UNITS PO CPEP
1.0000 | ORAL_CAPSULE | Freq: Three times a day (TID) | ORAL | Status: DC
  Administered 2013-06-22 – 2013-06-24 (×5): 1 via ORAL
  Filled 2013-06-22 (×8): qty 1

## 2013-06-22 MED ORDER — NITROGLYCERIN 0.4 MG SL SUBL
0.4000 mg | SUBLINGUAL_TABLET | SUBLINGUAL | Status: AC | PRN
Start: 2013-06-22 — End: 2013-06-22
  Administered 2013-06-22 (×3): 0.4 mg via SUBLINGUAL
  Filled 2013-06-22: qty 25

## 2013-06-22 MED ORDER — MORPHINE SULFATE 2 MG/ML IJ SOLN: 1.0000 mg | INTRAMUSCULAR | Status: DC | PRN

## 2013-06-22 MED ORDER — ACETAMINOPHEN 650 MG RE SUPP: 650.0000 mg | Freq: Four times a day (QID) | RECTAL | Status: DC | PRN

## 2013-06-22 MED ORDER — FENTANYL CITRATE 0.05 MG/ML IJ SOLN
50.0000 ug | Freq: Once | INTRAMUSCULAR | Status: AC
  Administered 2013-06-22: 50 ug via INTRAMUSCULAR

## 2013-06-22 MED ORDER — IOHEXOL 300 MG/ML  SOLN
25.0000 mL | INTRAMUSCULAR | Status: AC
  Administered 2013-06-22 (×2): 25 mL via ORAL

## 2013-06-22 MED ORDER — IOHEXOL 300 MG/ML  SOLN
80.0000 mL | Freq: Once | INTRAMUSCULAR | Status: AC | PRN
  Administered 2013-06-22: 80 mL via INTRAVENOUS

## 2013-06-22 MED ORDER — INSULIN ASPART 100 UNIT/ML ~~LOC~~ SOLN
0.0000 [IU] | Freq: Three times a day (TID) | SUBCUTANEOUS | Status: DC
  Administered 2013-06-22 – 2013-06-23 (×3): 1 [IU] via SUBCUTANEOUS

## 2013-06-22 MED ORDER — CARVEDILOL 3.125 MG PO TABS
3.1250 mg | ORAL_TABLET | Freq: Two times a day (BID) | ORAL | Status: DC
  Administered 2013-06-22 – 2013-06-24 (×4): 3.125 mg via ORAL
  Filled 2013-06-22 (×6): qty 1

## 2013-06-22 MED ORDER — FENTANYL CITRATE 0.05 MG/ML IJ SOLN: 50.0000 ug | Freq: Once | INTRAMUSCULAR | Status: DC

## 2013-06-22 MED ORDER — ASPIRIN 81 MG PO CHEW
324.0000 mg | CHEWABLE_TABLET | Freq: Once | ORAL | Status: AC
  Administered 2013-06-22: 324 mg via ORAL
  Filled 2013-06-22: qty 4

## 2013-06-22 MED ORDER — FENTANYL CITRATE 0.05 MG/ML IJ SOLN
50.0000 ug | Freq: Once | INTRAMUSCULAR | Status: AC
  Administered 2013-06-22: 50 ug via INTRAVENOUS
  Filled 2013-06-22: qty 2

## 2013-06-22 MED ORDER — AMIODARONE HCL 200 MG PO TABS: 200.0000 mg | ORAL_TABLET | ORAL | Status: DC

## 2013-06-22 MED ORDER — AMIODARONE HCL 200 MG PO TABS
200.0000 mg | ORAL_TABLET | ORAL | Status: DC
  Administered 2013-06-23 – 2013-06-24 (×2): 200 mg via ORAL
  Filled 2013-06-22 (×2): qty 1

## 2013-06-22 MED ORDER — ONDANSETRON HCL 4 MG/2ML IJ SOLN: 4.0000 mg | Freq: Four times a day (QID) | INTRAMUSCULAR | Status: DC | PRN

## 2013-06-22 MED ORDER — TAMSULOSIN HCL 0.4 MG PO CAPS
0.4000 mg | ORAL_CAPSULE | Freq: Two times a day (BID) | ORAL | Status: DC
  Administered 2013-06-22 – 2013-06-24 (×5): 0.4 mg via ORAL
  Filled 2013-06-22 (×6): qty 1

## 2013-06-22 MED ORDER — LISINOPRIL 2.5 MG PO TABS
2.5000 mg | ORAL_TABLET | Freq: Every day | ORAL | Status: DC
  Administered 2013-06-22 – 2013-06-24 (×3): 2.5 mg via ORAL
  Filled 2013-06-22 (×3): qty 1

## 2013-06-22 MED ORDER — ONDANSETRON HCL 4 MG PO TABS: 4.0000 mg | ORAL_TABLET | Freq: Four times a day (QID) | ORAL | Status: DC | PRN

## 2013-06-22 MED ORDER — FINASTERIDE 5 MG PO TABS
5.0000 mg | ORAL_TABLET | Freq: Every day | ORAL | Status: DC
  Administered 2013-06-22 – 2013-06-24 (×3): 5 mg via ORAL
  Filled 2013-06-22 (×3): qty 1

## 2013-06-22 MED ORDER — ACETAMINOPHEN 325 MG PO TABS
650.0000 mg | ORAL_TABLET | Freq: Four times a day (QID) | ORAL | Status: DC | PRN
  Administered 2013-06-22: 650 mg via ORAL
  Filled 2013-06-22: qty 2

## 2013-06-22 MED ORDER — VITAMIN C 500 MG PO TABS
500.0000 mg | ORAL_TABLET | Freq: Every day | ORAL | Status: DC
  Administered 2013-06-22 – 2013-06-24 (×3): 500 mg via ORAL
  Filled 2013-06-22 (×3): qty 1

## 2013-06-22 MED ORDER — SODIUM CHLORIDE 0.9 % IJ SOLN
3.0000 mL | Freq: Two times a day (BID) | INTRAMUSCULAR | Status: DC
  Administered 2013-06-23: 3 mL via INTRAVENOUS

## 2013-06-22 NOTE — ED Notes (Signed)
Patient is resting comfortably with family at bedside. BP noted 91/49.  Patient states he is tired. Dr. Unknown Foley notified and ordered for 250 ml normal saline bolus which was started at this time. Infusing well. Bolus completed and bp noted 113/ 53. Fingerstick =160. HR Sinus Rhythm on monitor, HR 66.

## 2013-06-22 NOTE — Telephone Encounter (Signed)
I received a call early this morning with complaint of pain and elevated BP. When I called the patient back, he was not available and I left a message on his voicemail. I advised him to call me back regarding these concerns, so that I could be of some assistance to him.

## 2013-06-22 NOTE — ED Notes (Signed)
Dr. Wofford at bedside 

## 2013-06-22 NOTE — ED Notes (Signed)
IV team requested for IV start.

## 2013-06-22 NOTE — ED Notes (Signed)
Per EMS: pt from [facility]. Pt c/o abdominal pain since 12:30 last night. Pt states pain starts in belly and goes up into left breast. Pt states 7/10. No medications given. CBG 146 BP 160/72 HR 76 RR 16. No EKG en route. Pt alert and oriented x 4. Pt ambulatory upon scene.

## 2013-06-22 NOTE — H&P (Signed)
[facility] History and Physical  [name] YNW:[id number] DOB: 07/09/1925 DOA: 06/22/2013  Referring physician: Candyce Churn, MD PCP: Illene Regulus, MD   Chief Complaint: Left upper quadrant abdominal pain  HPI: [name] is a 79 y.o. 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 Patient came in to the hospital because of left upper quadrant abdominal pain. Patient said he was in his usual state of health he went to bed last night, about 12:30 he developed this pain, paced at a 3/10 and it progresses now to 6-7/10. Patient has mild nausea with it but denies any vomiting, denies any diarrhea. Pain got worse so he came into the emergency department for further evaluation. Upon initial evaluation in the ED he was found to have a slightly elevated troponin of 0.49, CT scan of abdomen and pelvis was done and showed subcapsular fluid associated with the spleen and involvement of 3.2x2.4 cm mass in the left adrenal gland.  Review of Systems:  Constitutional: negative for anorexia, fevers and sweats Eyes: negative for irritation, redness and visual disturbance Ears, nose, mouth, throat, and face: negative for earaches, epistaxis, nasal congestion and sore throat Respiratory: negative for cough, dyspnea on exertion, sputum and wheezing Cardiovascular: negative for chest pain, dyspnea, lower extremity edema, orthopnea, palpitations and syncope Gastrointestinal: Has LUQ abdominal pain and nausea. Denies constipation, vomiting and weight loss Genitourinary:negative for dysuria, frequency and hematuria Hematologic/lymphatic: negative for bleeding, easy bruising and lymphadenopathy Musculoskeletal:negative for arthralgias, muscle weakness and stiff joints Neurological: negative for coordination problems, gait problems, headaches and weakness Endocrine: negative for  diabetic symptoms including polydipsia, polyuria and weight loss Allergic/Immunologic: negative for anaphylaxis, hay fever and urticaria   Past Medical History  Diagnosis Date  . Acid reflux disease     history of  . H/O: GI bleed   . Paroxysmal atrial fibrillation     chronic anticoag; tikosyn  . Benign prostatic hypertrophy     history of  . Hepatic damage march 2009    retained hepatic stone , intrahepatic duct catheter  . Diabetes mellitus     insulin dep  . CAD (coronary artery disease)   . Hypertension   . 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 Seizures   . Ventricular tachycardia ? Tikosyn proarrhtyhmia    Past Surgical History  Procedure Laterality Date  . Cardioversion    . Cholecystectomy      history of  . Partial gastrectomy      history of  . Shoulder surgery      left shoulder repair with 2 pens inserted  . Whipple procedure      operation  . Sarcoma removal from rigth tricep    . Inguinal hernia repair      inguinal herniorrhaphy, right... history of  . Appendectomy      history of  . Cardioversion  03/07/2012    Procedure: CARDIOVERSION;  Surgeon: Duke Salvia, MD;  Location: [facility] OR;  Service: Cardiovascular;  Laterality: N/A;   Social History:  reports that he quit smoking about 22 years ago. His smoking use included Pipe. He has never used smokeless tobacco. He reports that he does not drink alcohol or use illicit drugs.   Allergies  Allergen Reactions  . Clindamycin Other (See Comments)    Reaction unknown  . Oxycodone-Acetaminophen Other (See Comments)    Reaction unknown  . Penicillins     REACTION: causes hives    Family History  Problem Relation Age of Onset  . Coronary artery disease Father 23  . Heart disease Father     fatal MI  . Osteoarthritis Mother 53  . Arthritis Mother   . Coronary  artery disease Brother     cabg, avr, pvd with sents  . Heart disease Brother     CABG, PVD  . Peripheral vascular disease Sister 30  . Fibromyalgia Sister   . Arthritis Sister   . Coronary artery disease Sister   . Diabetes Sister   . Cancer Brother    Prior to Admission medications   Medication Sig Start Date End Date Taking? Authorizing Provider  amiodarone (PACERONE) 200 MG tablet Take 1 tablet (200 mg total) by mouth as directed. Take 1 tablet by mouth five day a week. 02/18/13 02/18/14 Yes Duke Salvia, MD  Ascorbic Acid (VITAMIN C) 500 MG tablet Take 500 mg by mouth daily.    Yes Historical Provider, MD  carvedilol (COREG) 3.125 MG tablet Take 1 tablet (3.125 mg total) by mouth 2 (two) times daily with a meal. 02/05/13 02/05/14 Yes Duke Salvia, MD  [facility] GLUCOSAMINE-CHONDROITIN PO Take 1 tablet by mouth daily. 1200/600 mg   Yes Historical Provider, MD  Emollient (AMLACTIN XL) LOTN Apply 1 application topically daily. Apply lotion to legs   Yes Historical Provider, MD  finasteride (PROSCAR) 5 MG tablet Take 1 tablet (5 mg total) by mouth daily. 05/20/13  Yes Jacques Navy, MD  insulin aspart (NOVOLOG FLEXPEN) 100 UNIT/ML injection Inject 6 Units into the skin 3 (three) times daily before meals. 11/13/12  Yes Jacques Navy, MD  insulin detemir (LEVEMIR FLEXPEN) 100 UNIT/ML injection Inject 10 Units into the skin at bedtime. 11/13/12  Yes Jacques Navy, MD  Insulin Pen Needle (COMFORT EZ PEN NEEDLES) 31G X 5 MM MISC 120 Devices by Does not apply route 4 (four) times daily. 12/07/12  Yes Jacques Navy, MD  lipase/protease/amylase (CREON-10/PANCREASE) 12000 UNITS CPEP Take 1 capsule by mouth 3 (three) times daily before meals.   Yes Historical Provider, MD  lisinopril (PRINIVIL,ZESTRIL) 2.5 MG tablet Take 1 tablet (2.5 mg total) by mouth daily. 02/05/13 02/05/14 Yes Duke Salvia, MD  Multiple Vitamins-Minerals (CENTRUM SILVER PO) Take 1 tablet by mouth daily.    Yes Historical  Provider, MD  Probiotic Product (PROBIOTIC DAILY PO) Take 1 tablet by mouth 5 (five) times daily.   Yes Historical Provider, MD  tamsulosin (FLOMAX) 0.4 MG CAPS Take 1 capsule (0.4 mg total) by mouth 2 (two) times daily. 05/20/13  Yes Jacques Navy, MD  warfarin (COUMADIN) 5 MG tablet Take 5 mg by mouth daily. Patient states he takes 5 mg daily 06/11/12  Yes Jacques Navy, MD   Physical Exam: Filed Vitals:   06/22/13 1315 06/22/13 1345 06/22/13 1400 06/22/13 1430  BP: 138/54 139/62 124/53 137/62  Pulse: 74 74 73 71  Temp:      TempSrc:      Resp: 20 19 13 12   SpO2: 99% 97% 94% 98%   General appearance: alert, cooperative and no distress  Head: Normocephalic, without obvious abnormality, atraumatic  Eyes: conjunctivae/corneas clear. PERRL, EOM's intact. Fundi benign.  Nose: Nares normal. Septum midline. Mucosa normal. No drainage or sinus tenderness.  Throat: lips, mucosa, and tongue normal; teeth and gums normal  Neck: Supple, no masses, no cervical lymphadenopathy, no JVD appreciated, no meningeal signs Resp: clear to auscultation bilaterally  Chest wall: no tenderness  Cardio: regular rate and rhythm, S1, S2 normal, no murmur, click, rub or gallop  GI: soft, non-tender; bowel sounds normal; no masses, no organomegaly  Extremities: extremities normal, atraumatic, no cyanosis or edema  Skin: Skin color, texture, turgor normal. No rashes or lesions  Neurologic: Alert and oriented X 3, normal strength and tone. Normal symmetric reflexes. Normal coordination and gait  Labs on Admission:  Basic Metabolic Panel:  Recent Labs Lab 06/22/13 0910  NA 136  K 4.5  CL 101  CO2 27  GLUCOSE 156*  BUN 20  CREATININE 1.13  CALCIUM 9.3   Liver Function Tests:  Recent Labs Lab 06/22/13 0910  AST 25  ALT 24  ALKPHOS 151*  BILITOT 0.4  PROT 6.8  ALBUMIN 3.4*    Recent Labs Lab 06/22/13 0910  LIPASE 7*   No results found for this basename: AMMONIA,  in the last 168  hours CBC:  Recent Labs Lab 06/22/13 0910  WBC 7.3  NEUTROABS 4.7  HGB 12.6*  HCT 36.6*  MCV 94.1  PLT 158   Cardiac Enzymes:  Recent Labs Lab 06/22/13 0912 06/22/13 1133  TROPONINI 0.40* 0.49*    BNP (last 3 results) No results found for this basename: PROBNP,  in the last 8760 hours CBG:  Recent Labs Lab 06/22/13 1209  GLUCAP 160*    Radiological Exams on Admission: Dg Chest 2 View  06/22/2013   *RADIOLOGY REPORT*  Clinical Data: Chest pain.  CHEST - 2 VIEW  Comparison: Chest x-ray 10/30/2012.  Findings: Long catheter fragment again seen projecting over the right hemithorax and mediastinum.  Lung volumes are low.  Mild diffuse peribronchial cuffing and mild interstitial prominence, similar to numerous prior examinations.  No definite acute consolidative airspace disease.  No pleural effusions.  No definite pulmonary edema.  Mild cephalization of the pulmonary vasculature, likely accentuated by low lung volumes.  Heart size is within normal limits.  Upper mediastinal contours are unremarkable. Atherosclerosis in the thoracic aorta.  IMPRESSION: 1.  The appearance of the lungs is similar to prior examinations, suggestive of chronic bronchitis. 2.  No definite radiographic evidence of acute cardiopulmonary disease is identified on today's examination. 3.  Retained catheter fragment within the right hemithorax is unchanged.   Original Report Authenticated By: Trudie Reed, M.D.   Ct Abdomen Pelvis W Contrast  06/22/2013   *RADIOLOGY REPORT*  Clinical Data: Abdominal pain.  Left flank pain.  CT ABDOMEN AND PELVIS WITH CONTRAST  Technique:  Multidetector CT imaging of the abdomen and pelvis was performed following the standard protocol during bolus administration of intravenous contrast.  Contrast: 1 OMNIPAQUE IOHEXOL 300 MG/ML  SOLN, 80mL OMNIPAQUE IOHEXOL 300 MG/ML  SOLN  Comparison: CT of the abdomen and pelvis 02/01/2008.  Findings:  Lung Bases: Multiple areas of architectural  distortion and linear opacities throughout the lung bases bilaterally are similar to prior study from 02/01/2008, most compatible with areas of chronic scarring.  Extensive calcifications of the mitral annulus.  A small hiatal hernia.  Abdomen/Pelvis:  Status post cholecystectomy.  Extensive pneumobilia throughout the liver, particularly in the left sided bile ducts.  No focal hepatic lesions are noted.  Postoperative changes of choledochojejunostomy are again noted.  Partial gastrectomy and gastrojejunostomy are also noted.  The pancreas is largely atrophic.  A  small amount of subcapsular fluid associated with the spleen.  Spleen is otherwise unremarkable in appearance. Interval development of a large 3.2 x 2.4 cm left adrenal mass. Ill-defined low attenuation area in the lower pole of the left kidney laterally is subcentimeter in size and too small to characterize.  Mild right renal atrophy.  Extensive atherosclerosis throughout the abdominal and pelvic vasculature, without definite aneurysm or dissection.  There are a few scattered colonic diverticula, without surrounding inflammatory changes to suggest acute diverticulitis at this time. No significant volume of ascites.  No pneumoperitoneum.  No pathologic distension of small bowel.  No definite pathologic lymphadenopathy identified within the abdomen or pelvis on today's examination.  Prostate gland appears mildly enlarged measuring approximately 5.5 x 6.1 cm.  Urinary bladder is normal in appearance.  Musculoskeletal: There are no aggressive appearing lytic or blastic lesions noted in the visualized portions of the skeleton.  IMPRESSION: 1. Trace amount of subcapsular fluid associated with the spleen. No evidence of frank splenic laceration or definite splenic contusion at this time.  This may account for the patient's left sided abdominal pain. 2.  Interval development of a 3.2 x 2.4 cm mass in the left adrenal gland.  This is completely new compared to the  prior study from 2009, but is incompletely characterized on today's examination. This is most concerning for potential metastasis.  If the patient has a history of primary malignancy, or concern for primary adrenal malignancy, this could be further characterized with abdominal MRI if indicated. 3.  Mild colonic diverticulosis without evidence to suggest acute diverticulitis at this time. 4.  Extensive atherosclerosis. 5.  Postoperative changes, as above. 6.  Mild prostate enlargement. 7.  Additional incidental findings, as above.   Original Report Authenticated By: Trudie Reed, M.D.    EKG: Independently reviewed.   Assessment/Plan Principal Problem:   LUQ abdominal pain Active Problems:   NEOP, MALIGNANT, BILIARY DUCTS NEC   DIABETES MELLITUS   ATRIAL FIBRILLATION, PAROXYSMAL   GASTRECTOMY, PARTIAL, HX OF   Elevated troponin   LUQ abdominal pain  -Pain is only controlled with narcotics, patient is on Coumadin and his lactic level is normal. -Per CT scan fluids, did not mention any presence of blood of bleeding around the splenic capsule. -Unclear etiology, probably will need surgical evaluation.  Elevated troponin -Cardiology consulted. -Patient is on Coreg.  Adrenal mass -Has history of soft tissue sarcoma and reported biliary cancer status post Whipple. -As mentioned above probably will be surgical evaluation. -Cardiology eval as he is having high troponin before any thinking about surgical evaluation/intervention  Paroxysmal atrial fibrillation -Rate is controlled with carvedilol, patient is on Coumadin.  Diabetes mellitus -Patient is on Levemir insulin, hold as patient will be on clear liquids. -Start insulin sliding scale, check hemoglobin A1c.  Code Status: *Full code Family Communication: Plan discussed with the patient presence of his wife. Disposition Plan: Inpatient, anticipate length of stay to be greater than 2 minutes nights.  Time spent: 70  minutes  [facility] Pager [phone]  If 7PM-7AM, please contact night-coverage [email or web address] Password [facility] 06/22/2013, 3:33 PM

## 2013-06-22 NOTE — ED Notes (Signed)
Pt states he still has 3/10 abd pain.  States that he does not want any medications at the present.

## 2013-06-22 NOTE — Progress Notes (Signed)
ANTICOAGULATION CONSULT NOTE - Initial Consult  Pharmacy Consult for warfarin Indication: atrial fibrillation  Allergies  Allergen Reactions  . Clindamycin Other (See Comments)    Reaction unknown  . Oxycodone-Acetaminophen Other (See Comments)    Reaction unknown  . Penicillins     REACTION: causes hives    Vital Signs: Temp: 97.5 F (36.4 C) (07/26 0841) Temp src: Oral (07/26 0841) BP: 137/62 mmHg (07/26 1430) Pulse Rate: 71 (07/26 1430)  Labs:  Recent Labs  06/22/13 0910 06/22/13 0912 06/22/13 1133  HGB 12.6*  --   --   HCT 36.6*  --   --   PLT 158  --   --   LABPROT 25.8*  --   --   INR 2.45*  --   --   CREATININE 1.13  --   --   TROPONINI  --  0.40* 0.49*    The CrCl is unknown because both a height and weight (above a minimum accepted value) are required for this calculation.   Medical History: Past Medical History  Diagnosis Date  . Acid reflux disease     history of  . H/O: GI bleed   . Paroxysmal atrial fibrillation     chronic anticoag; tikosyn  . Benign prostatic hypertrophy     history of  . Hepatic damage march 2009    retained hepatic stone , intrahepatic duct catheter  . Diabetes mellitus     insulin dep  . CAD (coronary artery disease)   . Hypertension   . 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 Seizures   . Ventricular tachycardia ? Tikosyn proarrhtyhmia     Medications:  See med history Assessment: 79 year old man with AFIB on warfarin comes to the ED with abdominal pain of uncertain cause.  INR is therapeutic at 2.46.  Home dose is 5mg  daily.   Goal of Therapy:  INR 2-3    Plan:  Continue home dose of warfarin 5mg  daily Daily protimes  Mickeal Skinner 06/22/2013,3:48 PM

## 2013-06-22 NOTE — ED Notes (Signed)
Pt states that his pain has decreased to a 4/10 and that is tolerable at the present.

## 2013-06-22 NOTE — ED Notes (Signed)
Notified CT of pt's completion of 1st cup of contrast.

## 2013-06-22 NOTE — ED Provider Notes (Addendum)
CSN: [id number]     Arrival date & time 06/22/13  5621 History     First MD Initiated Contact with Patient 06/22/13 0840     Chief Complaint  Patient presents with  . Abdominal Pain   (Consider location/radiation/quality/duration/timing/severity/associated sxs/prior Treatment) Patient is a 79 y.o. male presenting with abdominal pain.  Abdominal Pain This is a new problem. The current episode started 6 to 12 hours ago. The problem occurs constantly. The problem has been gradually worsening. Associated symptoms include chest pain and abdominal pain. Pertinent negatives include no shortness of breath. Nothing aggravates the symptoms. Nothing relieves the symptoms.    Past Medical History  Diagnosis Date  . Acid reflux disease     history of  . H/O: GI bleed   . Paroxysmal atrial fibrillation     chronic anticoag; tikosyn  . Benign prostatic hypertrophy     history of  . Hepatic damage march 2009    retained hepatic stone , intrahepatic duct catheter  . Diabetes mellitus     insulin dep  . CAD (coronary artery disease)   . Hypertension   . 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 Seizures   . Ventricular tachycardia ? Tikosyn proarrhtyhmia    Past Surgical History  Procedure Laterality Date  . Cardioversion    . Cholecystectomy      history of  . Partial gastrectomy      history of  . Shoulder surgery      left shoulder repair with 2 pens inserted  . Whipple procedure      operation  . Sarcoma removal from rigth tricep    . Inguinal hernia repair      inguinal herniorrhaphy, right... history of  . Appendectomy      history of  . Cardioversion  03/07/2012    Procedure: CARDIOVERSION;  Surgeon: Duke Salvia, MD;  Location: [facility] OR;  Service: Cardiovascular;  Laterality: N/A;   Family History  Problem Relation Age of Onset  .  Coronary artery disease Father 45  . Heart disease Father     fatal MI  . Osteoarthritis Mother 71  . Arthritis Mother   . Coronary artery disease Brother     cabg, avr, pvd with sents  . Heart disease Brother     CABG, PVD  . Peripheral vascular disease Sister 32  . Fibromyalgia Sister   . Arthritis Sister   . Coronary artery disease Sister   . Diabetes Sister   . Cancer Brother    History  Substance Use Topics  . Smoking status: Former Smoker    Types: Pipe    Quit date: 03/14/1991  . Smokeless tobacco: Never Used  . Alcohol Use: No    Review of Systems  Constitutional: Negative for fever.  HENT: Negative for congestion.   Respiratory: Negative for cough and shortness of breath.   Cardiovascular: Positive for chest pain.  Gastrointestinal: Positive for abdominal pain. Negative for nausea, vomiting, diarrhea and constipation.  Genitourinary: Negative for difficulty urinating.  All other systems reviewed and are negative.    Allergies  Clindamycin; Oxycodone-acetaminophen; and Penicillins  Home Medications   Current Outpatient Rx  Name  Route  Sig  Dispense  Refill  . amiodarone (PACERONE) 200 MG tablet   Oral   Take 1 tablet (200 mg total)  by mouth as directed. Take 1 tablet by mouth five day a week.   90 tablet   3   . Ascorbic Acid (VITAMIN C) 500 MG tablet   Oral   Take 500 mg by mouth daily.          . carvedilol (COREG) 3.125 MG tablet   Oral   Take 1 tablet (3.125 mg total) by mouth 2 (two) times daily with a meal.   180 tablet   3   . [facility] GLUCOSAMINE-CHONDROITIN PO   Oral   Take 1 tablet by mouth daily. 1200/600 mg         . Emollient (AMLACTIN XL) LOTN   Topical   Apply 1 application topically daily. Apply lotion to legs         . finasteride (PROSCAR) 5 MG tablet   Oral   Take 1 tablet (5 mg total) by mouth daily.   90 tablet   3   . insulin aspart (NOVOLOG FLEXPEN) 100 UNIT/ML injection   Subcutaneous   Inject 6 Units into  the skin 3 (three) times daily before meals.   6 pen   3   . insulin detemir (LEVEMIR FLEXPEN) 100 UNIT/ML injection   Subcutaneous   Inject 10 Units into the skin at bedtime.   9 mL   3   . Insulin Pen Needle (COMFORT EZ PEN NEEDLES) 31G X 5 MM MISC   Does not apply   120 Devices by Does not apply route 4 (four) times daily.   120 each   11   . lipase/protease/amylase (CREON-10/PANCREASE) 12000 UNITS CPEP   Oral   Take 1 capsule by mouth 3 (three) times daily before meals.         Marland Kitchen lisinopril (PRINIVIL,ZESTRIL) 2.5 MG tablet   Oral   Take 1 tablet (2.5 mg total) by mouth daily.   90 tablet   3   . Multiple Vitamins-Minerals (CENTRUM SILVER PO)   Oral   Take 1 tablet by mouth daily.          . Probiotic Product (PROBIOTIC MULTI-ENZYME PO)      TAKE 5 PER DAY WITH MEALS         . tamsulosin (FLOMAX) 0.4 MG CAPS   Oral   Take 1 capsule (0.4 mg total) by mouth 2 (two) times daily.   180 capsule   3   . warfarin (COUMADIN) 5 MG tablet   Oral   Take 2.5-7.5 mg by mouth daily. Patient states he takes 5 mg daily          BP 148/55  Pulse 81  Temp(Src) 97.5 F (36.4 C) (Oral)  Resp 21  SpO2 98% Physical Exam  Nursing note and vitals reviewed. Constitutional: He is oriented to person, place, and time. He appears well-developed and well-nourished. No distress.  HENT:  Head: Normocephalic and atraumatic.  Mouth/Throat: Oropharynx is clear and moist.  Eyes: Conjunctivae are normal. Pupils are equal, round, and reactive to light. No scleral icterus.  Neck: Neck supple.  Cardiovascular: Normal rate, regular rhythm, normal heart sounds and intact distal pulses.   No murmur heard. Pulmonary/Chest: Effort normal and breath sounds normal. No stridor. No respiratory distress. He has no wheezes. He has no rales.  Abdominal: Soft. Bowel sounds are normal. He exhibits no distension. There is no tenderness. There is no rigidity, no rebound, no guarding and no CVA  tenderness.  Musculoskeletal: Normal range of motion. He exhibits no edema.  Neurological:  He is alert and oriented to person, place, and time.  Skin: Skin is warm and dry. No rash noted.  Psychiatric: He has a normal mood and affect. His behavior is normal.    ED Course   Procedures (including critical care time)  All labs drawn in ED reviewed.    Dg Chest 2 View  06/22/2013   *RADIOLOGY REPORT*  Clinical Data: Chest pain.  CHEST - 2 VIEW  Comparison: Chest x-ray 10/30/2012.  Findings: Long catheter fragment again seen projecting over the right hemithorax and mediastinum.  Lung volumes are low.  Mild diffuse peribronchial cuffing and mild interstitial prominence, similar to numerous prior examinations.  No definite acute consolidative airspace disease.  No pleural effusions.  No definite pulmonary edema.  Mild cephalization of the pulmonary vasculature, likely accentuated by low lung volumes.  Heart size is within normal limits.  Upper mediastinal contours are unremarkable. Atherosclerosis in the thoracic aorta.  IMPRESSION: 1.  The appearance of the lungs is similar to prior examinations, suggestive of chronic bronchitis. 2.  No definite radiographic evidence of acute cardiopulmonary disease is identified on today's examination. 3.  Retained catheter fragment within the right hemithorax is unchanged.   Original Report Authenticated By: Trudie Reed, M.D.   Ct Abdomen Pelvis W Contrast  06/22/2013   *RADIOLOGY REPORT*  Clinical Data: Abdominal pain.  Left flank pain.  CT ABDOMEN AND PELVIS WITH CONTRAST  Technique:  Multidetector CT imaging of the abdomen and pelvis was performed following the standard protocol during bolus administration of intravenous contrast.  Contrast: 1 OMNIPAQUE IOHEXOL 300 MG/ML  SOLN, 80mL OMNIPAQUE IOHEXOL 300 MG/ML  SOLN  Comparison: CT of the abdomen and pelvis 02/01/2008.  Findings:  Lung Bases: Multiple areas of architectural distortion and linear opacities  throughout the lung bases bilaterally are similar to prior study from 02/01/2008, most compatible with areas of chronic scarring.  Extensive calcifications of the mitral annulus.  A small hiatal hernia.  Abdomen/Pelvis:  Status post cholecystectomy.  Extensive pneumobilia throughout the liver, particularly in the left sided bile ducts.  No focal hepatic lesions are noted.  Postoperative changes of choledochojejunostomy are again noted.  Partial gastrectomy and gastrojejunostomy are also noted.  The pancreas is largely atrophic.  A small amount of subcapsular fluid associated with the spleen.  Spleen is otherwise unremarkable in appearance. Interval development of a large 3.2 x 2.4 cm left adrenal mass. Ill-defined low attenuation area in the lower pole of the left kidney laterally is subcentimeter in size and too small to characterize.  Mild right renal atrophy.  Extensive atherosclerosis throughout the abdominal and pelvic vasculature, without definite aneurysm or dissection.  There are a few scattered colonic diverticula, without surrounding inflammatory changes to suggest acute diverticulitis at this time. No significant volume of ascites.  No pneumoperitoneum.  No pathologic distension of small bowel.  No definite pathologic lymphadenopathy identified within the abdomen or pelvis on today's examination.  Prostate gland appears mildly enlarged measuring approximately 5.5 x 6.1 cm.  Urinary bladder is normal in appearance.  Musculoskeletal: There are no aggressive appearing lytic or blastic lesions noted in the visualized portions of the skeleton.  IMPRESSION: 1. Trace amount of subcapsular fluid associated with the spleen. No evidence of frank splenic laceration or definite splenic contusion at this time.  This may account for the patient's left sided abdominal pain. 2.  Interval development of a 3.2 x 2.4 cm mass in the left adrenal gland.  This is completely new compared to the  prior study from 2009, but is  incompletely characterized on today's examination. This is most concerning for potential metastasis.  If the patient has a history of primary malignancy, or concern for primary adrenal malignancy, this could be further characterized with abdominal MRI if indicated. 3.  Mild colonic diverticulosis without evidence to suggest acute diverticulitis at this time. 4.  Extensive atherosclerosis. 5.  Postoperative changes, as above. 6.  Mild prostate enlargement. 7.  Additional incidental findings, as above.   Original Report Authenticated By: Trudie Reed, M.D.  All radiology studies independently viewed by me.     EKG - NSR, rate 75, left axis, PRI 216, QTx 496, nonspecific ST changes in lateral leads similar to prior, QTc slightly longer compared to prior.    1. Elevated troponin   2. Atrial fibrillation   3. CAD (coronary artery disease)   4. LUQ abdominal pain     MDM  79 yo male with left upper abdominal pain/chest pain.  Constant, burning, 7/10.  No associated symptoms including nausea, vomiting, diarrhea, constipation, SOB, dizziness, diaphoresis.  Abdomen soft, nontender to deep palpation.  Plan broad workup given age, risk.    Labwork concerning for possible ACS, but CT abd/pel showed subcapsular splenic free fluid of unclear etiology.  This is perhaps explanation for pain.  Discussed with both cardiology and admitted to internal medicine.  Decision for heparin deferred to Cardiology and Internal medicine given diagnostic uncertainty and possible splenic injury.  IV fentanyl given for pain with some improvement.   Imaging also showed adrenal mass which was communicated to patient.    Candyce Churn, MD 06/23/13 1610  Candyce Churn, MD 06/23/13 [phone]

## 2013-06-22 NOTE — ED Notes (Signed)
Patient resting. CT to bedside for oral contrast started at this time.

## 2013-06-22 NOTE — ED Notes (Signed)
Pt states he thought pain was caused by pulled muscle. Pt states pain starts in left side of abdomen and radiates into left chest. Pt states burning pain. Pt alert and mentating appropriately. Pt denies other associated symptoms aside from pain. NAD noted at this time. Family at bedside.

## 2013-06-23 ENCOUNTER — Inpatient Hospital Stay (HOSPITAL_COMMUNITY): Payer: Medicare Other

## 2013-06-23 DIAGNOSIS — Z9889 Other specified postprocedural states: Secondary | ICD-10-CM

## 2013-06-23 DIAGNOSIS — D3500 Benign neoplasm of unspecified adrenal gland: Secondary | ICD-10-CM

## 2013-06-23 DIAGNOSIS — E119 Type 2 diabetes mellitus without complications: Secondary | ICD-10-CM

## 2013-06-23 DIAGNOSIS — R1012 Left upper quadrant pain: Principal | ICD-10-CM

## 2013-06-23 DIAGNOSIS — R7989 Other specified abnormal findings of blood chemistry: Secondary | ICD-10-CM

## 2013-06-23 DIAGNOSIS — I2510 Atherosclerotic heart disease of native coronary artery without angina pectoris: Secondary | ICD-10-CM

## 2013-06-23 LAB — TSH: TSH: 6.699 u[IU]/mL — ABNORMAL HIGH (ref 0.350–4.500)

## 2013-06-23 LAB — BASIC METABOLIC PANEL
BUN: 15 mg/dL (ref 6–23)
Calcium: 8.6 mg/dL (ref 8.4–10.5)
Creatinine, Ser: 0.98 mg/dL (ref 0.50–1.35)
GFR calc non Af Amer: 72 mL/min — ABNORMAL LOW (ref >90)
Glucose, Bld: 144 mg/dL — ABNORMAL HIGH (ref 70–99)
Potassium: 4 mEq/L (ref 3.5–5.1)

## 2013-06-23 LAB — CBC
Hemoglobin: 12 g/dL — ABNORMAL LOW (ref 13.0–17.0)
MCH: 32.3 pg (ref 26.0–34.0)
MCHC: 34.4 g/dL (ref 30.0–36.0)
RDW: 13.5 % (ref 11.5–15.5)

## 2013-06-23 LAB — GLUCOSE, CAPILLARY: Glucose-Capillary: 112 mg/dL — ABNORMAL HIGH (ref 70–99)

## 2013-06-23 LAB — HEMOGLOBIN A1C
Hgb A1c MFr Bld: 6.3 % — ABNORMAL HIGH (ref <5.7)
Mean Plasma Glucose: 134 mg/dL — ABNORMAL HIGH (ref <117)

## 2013-06-23 LAB — TROPONIN I: Troponin I: <0.3 ng/mL (ref <0.30)

## 2013-06-23 LAB — PROTIME-INR: Prothrombin Time: 27.5 seconds — ABNORMAL HIGH (ref 11.6–15.2)

## 2013-06-23 MED ORDER — GADOBENATE DIMEGLUMINE 529 MG/ML IV SOLN
20.0000 mL | Freq: Once | INTRAVENOUS | Status: AC | PRN
  Administered 2013-06-23: 20 mL via INTRAVENOUS

## 2013-06-23 MED ORDER — INSULIN DETEMIR 100 UNIT/ML ~~LOC~~ SOLN
10.0000 [IU] | Freq: Every day | SUBCUTANEOUS | Status: DC
  Administered 2013-06-23: 10 [IU] via SUBCUTANEOUS
  Filled 2013-06-23 (×2): qty 0.1

## 2013-06-23 NOTE — Progress Notes (Signed)
ANTICOAGULATION CONSULT NOTE - Initial Consult  Pharmacy Consult for warfarin/lovenox Indication: atrial fibrillation  Allergies  Allergen Reactions  . Clindamycin Other (See Comments)    Reaction unknown  . Oxycodone-Acetaminophen Other (See Comments)    Reaction unknown  . Penicillins     REACTION: causes hives    Vital Signs: Temp: 97.9 F (36.6 C) (07/27 0405) Temp src: Oral (07/27 0405) BP: 122/63 mmHg (07/27 0405) Pulse Rate: 66 (07/27 0405)  Labs:  Recent Labs  06/22/13 0910  06/22/13 1601 06/22/13 2215 06/23/13 0430 06/23/13 0450  HGB 12.6*  --   --   --   --  12.0*  HCT 36.6*  --   --   --   --  34.9*  PLT 158  --   --   --   --  142*  LABPROT 25.8*  --   --   --   --  27.5*  INR 2.45*  --   --   --   --  2.67*  CREATININE 1.13  --   --   --   --  0.98  TROPONINI  --   < > 0.33* 0.33* <0.30  --   < > = values in this interval not displayed.  Estimated Creatinine Clearance: 64.7 ml/min (by C-G formula based on Cr of 0.98).   Medical History: Past Medical History  Diagnosis Date  . Acid reflux disease     history of  . H/O: GI bleed   . Paroxysmal atrial fibrillation     chronic anticoag; tikosyn  . Benign prostatic hypertrophy     history of  . Hepatic damage march 2009    retained hepatic stone , intrahepatic duct catheter  . Diabetes mellitus     insulin dep  . CAD (coronary artery disease)   . Hypertension   . 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 Seizures   . Ventricular tachycardia ? Tikosyn proarrhtyhmia     Assessment: 79 year old man with AFIB resumed on warfarin home dose, INR (2.67) is therapeutic this morning. CT scan revealed an adrenal tumor, may require percutaneous needle biopsy. Pharmacy is consulted to transition coumadin to lovenox in anticipation of biopsy. H/H stable, plt 142, no  vitamin K ordered.  Home dose is 5mg  daily.   Goal of Therapy:  INR 2-3    Plan:  - D/c scheduled coumadin dose - f/u daily INR and start lovenox when INR < 2 - f/u plans regarding biopsy  Bayard Hugger, PharmD, BCPS  Clinical Pharmacist  Pager: [phone]   06/23/2013,8:15 AM

## 2013-06-23 NOTE — Progress Notes (Signed)
Subjective: [name] with abdominal pain with atypical characteristics and a minimal elevation in Troponin I. CT did reveal new left adrenal tumor with concern for metastatic disease. He is s/p successful whipple for pancreatic cancer.  [name]. He has no discomfort.  Objective: Lab:  Recent Labs  06/22/13 0910 06/23/13 0450  WBC 7.3 6.7  NEUTROABS 4.7  --   HGB 12.6* 12.0*  HCT 36.6* 34.9*  MCV 94.1 94.1  PLT 158 142*    Recent Labs  06/22/13 0910 06/23/13 0450  NA 136 138  K 4.5 4.0  CL 101 105  GLUCOSE 156* 144*  BUN 20 15  CREATININE 1.13 0.98  CALCIUM 9.3 8.6   Cardiac Panel (last 3 results)  Recent Labs  06/22/13 1601 06/22/13 2215 06/23/13 0430  TROPONINI 0.33* 0.33* <0.30     Imaging: CT abd/pelvis: IMPRESSION:  1. Trace amount of subcapsular fluid associated with the spleen.  No evidence of frank splenic laceration or definite splenic  contusion at this time. This may account for the patient's left  sided abdominal pain.  2. Interval development of a 3.2 x 2.4 cm mass in the left adrenal  gland. This is completely new compared to the prior study from  2009, but is incompletely characterized on today's examination.  This is most concerning for potential metastasis. If the patient  has a history of primary malignancy, or concern for primary adrenal  malignancy, this could be further characterized with abdominal MRI  if indicated.  3. Mild colonic diverticulosis without evidence to suggest acute  diverticulitis at this time.  4. Extensive atherosclerosis.  5. Postoperative changes, as above.  6. Mild prostate enlargement.  7. Additional incidental findings, as above.  CXR: IMPRESSION:  1. The appearance of the lungs is similar to prior examinations,  suggestive of chronic bronchitis.  2. No definite radiographic evidence of acute cardiopulmonary  disease is identified on today's examination.   3. Retained catheter fragment within the right hemithorax is  unchanged.   Scheduled Meds: . amiodarone  200 mg Oral Custom  . carvedilol  3.125 mg Oral BID WC  . finasteride  5 mg Oral Daily  . insulin aspart  0-9 Units Subcutaneous TID WC  . lipase/protease/amylase  1 capsule Oral TID AC  . lisinopril  2.5 mg Oral Daily  . sodium chloride  3 mL Intravenous Q12H  . tamsulosin  0.4 mg Oral BID  . vitamin C  500 mg Oral Daily  . warfarin  5 mg Oral q1800  . Warfarin - Pharmacist Dosing Inpatient   Does not apply q1800   Continuous Infusions: . sodium chloride 75 mL/hr at 06/23/13 0633   PRN Meds:.acetaminophen, acetaminophen, morphine injection, ondansetron (ZOFRAN) IV, ondansetron   Physical Exam: Filed Vitals:   06/23/13 0405  BP: 122/63  Pulse: 66  Temp: 97.9 F (36.6 C)  Resp: 19    Intake/Output Summary (Last 24 hours) at 06/23/13 0750 Last data filed at 06/23/13 4098  Gross per 24 hour  Intake      0 ml  Output   1725 ml  Net  -1725 ml    Gen'l- WNWD white man in no distress HEENT- C&S clear Cor - RRR Pulm - normal respirations Abd - BS+ x 4, no guarding or rebound, no tenderness to palpation. Neuro - A&O     Assessment/Plan: 1. Abdominal pain - resolved. Etiology undetermined  2. Cardiac - minimal elevation in  Tropinin. No EKG report located in [facility] but patient reports he had EKG in ED and was told it was normal. He has not had any chest pain.  Plan D/c telemetry  3. Adrenal tumor - new finding on CT. He does have a h/o pancreatic cancer with successful Whipple several years ago.  Plan MRI Abdomen to further characterize tumor  May come to percutaneous needle biopsy.  4. DM - Will start a diet and resume home regimen.   Illene Regulus Weldon IM (o) [phone]; (c) [phone] Call-grp - Patsi Sears IM  Tele: [phone]  06/23/2013, 7:36 AM

## 2013-06-24 DIAGNOSIS — I4891 Unspecified atrial fibrillation: Secondary | ICD-10-CM

## 2013-06-24 LAB — GLUCOSE, CAPILLARY: Glucose-Capillary: 87 mg/dL (ref 70–99)

## 2013-06-24 NOTE — Progress Notes (Signed)
Patient stable. No recurrent abdominal pain. MRI abdomen - adrenal mass that is not highly suspicious.  For d/c home. Keep appointment in August. Dictated.

## 2013-06-24 NOTE — Progress Notes (Signed)
UR Completed.  Vangie Bicker [phone] 06/24/2013

## 2013-06-24 NOTE — Discharge Summary (Signed)
NAMEMarland Kitchen  [name], CHEVEZ NO.:  [id number]  MEDICAL RECORD NO.:  [id number]  LOCATION:  2019                         FACILITY:  [facility]  PHYSICIAN:  Casey Gess. Isaiha Asare, MD  DATE OF BIRTH:  04-28-1925  DATE OF ADMISSION:  06/22/2013 DATE OF DISCHARGE:                              DISCHARGE SUMMARY   ADMITTING DIAGNOSIS:  Left upper quadrant abdominal pain.  DISCHARGE DIAGNOSES: 1. Abdominal pain, resolved. 2. Incidental adrenal tumor. 3. Diabetes, well controlled.  CONSULTANTS:  None.  PROCEDURES:  CT of the abdomen and pelvis performed June 22, 2013, which showed trace amount of subcapsular fluid associated with the spleen.  No evidence of frank splenic laceration or definite splenic contusion.  Interval development of a 3.2 x 2.4 cm mass in the left adrenal gland completely new from prior study of 2009.  Mild colonic diverticulosis without evidence to suggest acute diverticulitis.  Extensive atherosclerosis noted.  Postoperative changes after Whipple noted.  Mild prostate enlargement noted.  Next, a chest x- ray, June 22, 2013, which showed the appearance of the lungs similar to prior examination suggestive of chronic bronchitis.  No radiographic evidence of acute cardiopulmonary disease.  Retained catheter fragment within the right hemithorax is unchanged.  MRI scan of the abdomen with and without contrast, and the impression was there are new left adrenal nodule demonstrates nonspecific signal characteristics without loss of signal on the gradient echo opposed phase images.  Therefore, this does not represent a fat containing adenoma.  However, because there is no enhancement of this lesion, this could reflect an adrenal hematoma.  A hypovascular necrotic metastasis is considered less likely.  Stable appearance status post Whipple procedure.  No other evidence of metastatic disease.  Small stable focus of a arterial phase enhancement in the dome of the  right hepatic lobe. Followup with noncontrast CT in 3 months is recommended.  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  The patient had the onset of left upper quadrant abdominal pain the night prior to admission.  He had been in his usual state of good health and was awaken from sleep by the pain, rated initially at a 3/10 and progressing to a 7/10.  The patient had mild nausea, but denied any vomiting, denied any diarrhea.  Because the pain became progressively worse, he came to the emergency department for evaluation.  Troponin was mildly positive at 0.49, CT scan revealed a new splenic mass, as well as a subcapsular fluid associated with the spleen.  Please see the H and P for past medical history, family history, social history, and admission examination.  HOSPITAL COURSE:  The patient was admitted to a telemetry unit. 1. Cardiac.  The patient's cardiac enzymes returned to normal.  His     EKG had been normal.  There is no evidence of a cardiac event and     no further evaluation was required. 2. Abdominal pain.  The patient's abdominal pain resolved     spontaneously.  He did  have some subcapsular of abdominal fluid     around the area of the spleen, but no clear cut explantation for     his discomfort.  With his pain being resolved and with no     recurrence for 24 hours, he is felt stable and ready for discharge. 3. Adrenal mass, left.  Studies as above.  Plan is for the patient to     have a followup CT without contrast in 3 months. 4. Diabetes.  The patient will continue his home regimen.  He did well     in the hospital.  His last hemoglobin A1c was performed June 22, 2013, and was 6.3%.  DISCHARGE EXAMINATION:  VITAL SIGNS:  Temperature was 97.7, blood pressure  120/65, heart rate 62, respirations 16, O2 sats 98% on room air. GENERAL:  The patient is sitting up in his chair eating breakfast.  He has no respiratory distress.  No further examination was conducted.  FINAL LABORATORY DATA:  Chemistries from July 27, with a sodium of 138, potassium 4, chloride 105, CO2 26, BUN 15, creatinine 0.98, glucose was 144.  Cardiac enzymes, troponins were 0.33, 0.33, and less than 3.0. Final CBC from June 23, 2013, with a white count of 6700, hemoglobin 12 g, hematocrit was 34.9%, platelet count 142,000.  Differential on June 22, 2013, was normal.  DISCHARGE MEDICATIONS:  The patient is to resume his home medications including the following: 1. Amiodarone 200 mg daily. 2. Vitamin C 500 mg daily. 3. Carvedilol 3.125 mg b.i.d. 4. Glucosamine chondroitin p.o. 1 tablet daily. 5. AmLactin lotion applied p.r.n. 6. Finasteride 5 mg p.o. daily. 7. NovoLog 6 units into the skin before meal. 8. Levemir 10 units subcu at bedtime. 9. Creon-10/Pancrease 12,000 units 1 capsule 3 times daily before     meals. 10.Lisinopril 2.5 mg daily. 11.Multivitamins daily. 12.Probiotic of choice daily. 13.Tamsulosin 0.4 mg at bedtime. 14.Warfarin as directed.  DISPOSITION:  The patient is discharged to home.  He has a scheduled appointment for a general physical exam in August, and he will keep that appointment.  The patient will be scheduled for a followup CT scan in 3 months.  The patient will call sooner if he has any additional problems.  The patient's condition at time of discharge dictation is stable and improved.     Casey Gess Zuleima Haser, MD     MEN/MEDQ  D:  06/24/2013  T:  06/24/2013  Job:  [id number]

## 2013-07-03 ENCOUNTER — Other Ambulatory Visit: Payer: Self-pay

## 2013-07-06 ENCOUNTER — Inpatient Hospital Stay (HOSPITAL_COMMUNITY)
Admission: EM | Admit: 2013-07-06 | Discharge: 2013-07-10 | DRG: 393 | Disposition: A | Discharge status: Home / Self Care | Payer: Medicare Other | Attending: Internal Medicine | Admitting: Internal Medicine

## 2013-07-06 ENCOUNTER — Encounter (HOSPITAL_COMMUNITY): Payer: Self-pay | Admitting: Internal Medicine

## 2013-07-06 ENCOUNTER — Emergency Department (HOSPITAL_COMMUNITY): Payer: Medicare Other

## 2013-07-06 ENCOUNTER — Observation Stay (HOSPITAL_COMMUNITY): Payer: Medicare Other

## 2013-07-06 DIAGNOSIS — E119 Type 2 diabetes mellitus without complications: Secondary | ICD-10-CM | POA: Diagnosis present

## 2013-07-06 DIAGNOSIS — T45515A Adverse effect of anticoagulants, initial encounter: Secondary | ICD-10-CM | POA: Diagnosis present

## 2013-07-06 DIAGNOSIS — D649 Anemia, unspecified: Secondary | ICD-10-CM

## 2013-07-06 DIAGNOSIS — N179 Acute kidney failure, unspecified: Secondary | ICD-10-CM | POA: Diagnosis present

## 2013-07-06 DIAGNOSIS — K661 Hemoperitoneum: Secondary | ICD-10-CM

## 2013-07-06 DIAGNOSIS — I4729 Other ventricular tachycardia: Secondary | ICD-10-CM | POA: Diagnosis present

## 2013-07-06 DIAGNOSIS — Z8509 Personal history of malignant neoplasm of other digestive organs: Secondary | ICD-10-CM

## 2013-07-06 DIAGNOSIS — I959 Hypotension, unspecified: Secondary | ICD-10-CM | POA: Diagnosis present

## 2013-07-06 DIAGNOSIS — I4891 Unspecified atrial fibrillation: Secondary | ICD-10-CM

## 2013-07-06 DIAGNOSIS — K219 Gastro-esophageal reflux disease without esophagitis: Secondary | ICD-10-CM | POA: Diagnosis present

## 2013-07-06 DIAGNOSIS — D68318 Other hemorrhagic disorder due to intrinsic circulating anticoagulants, antibodies, or inhibitors: Secondary | ICD-10-CM

## 2013-07-06 DIAGNOSIS — W19XXXA Unspecified fall, initial encounter: Secondary | ICD-10-CM | POA: Diagnosis present

## 2013-07-06 DIAGNOSIS — I472 Ventricular tachycardia, unspecified: Secondary | ICD-10-CM | POA: Diagnosis present

## 2013-07-06 DIAGNOSIS — R58 Hemorrhage, not elsewhere classified: Secondary | ICD-10-CM | POA: Diagnosis present

## 2013-07-06 DIAGNOSIS — R578 Other shock: Secondary | ICD-10-CM | POA: Diagnosis present

## 2013-07-06 DIAGNOSIS — E118 Type 2 diabetes mellitus with unspecified complications: Secondary | ICD-10-CM | POA: Diagnosis present

## 2013-07-06 DIAGNOSIS — Z87891 Personal history of nicotine dependence: Secondary | ICD-10-CM

## 2013-07-06 DIAGNOSIS — R1012 Left upper quadrant pain: Secondary | ICD-10-CM | POA: Diagnosis present

## 2013-07-06 DIAGNOSIS — S0990XA Unspecified injury of head, initial encounter: Secondary | ICD-10-CM

## 2013-07-06 DIAGNOSIS — N400 Enlarged prostate without lower urinary tract symptoms: Secondary | ICD-10-CM | POA: Diagnosis present

## 2013-07-06 DIAGNOSIS — E860 Dehydration: Secondary | ICD-10-CM | POA: Diagnosis present

## 2013-07-06 DIAGNOSIS — S0001XA Abrasion of scalp, initial encounter: Secondary | ICD-10-CM

## 2013-07-06 DIAGNOSIS — W19XXXD Unspecified fall, subsequent encounter: Secondary | ICD-10-CM

## 2013-07-06 DIAGNOSIS — E869 Volume depletion, unspecified: Secondary | ICD-10-CM

## 2013-07-06 DIAGNOSIS — Z794 Long term (current) use of insulin: Secondary | ICD-10-CM

## 2013-07-06 DIAGNOSIS — I10 Essential (primary) hypertension: Secondary | ICD-10-CM | POA: Diagnosis present

## 2013-07-06 DIAGNOSIS — I4819 Other persistent atrial fibrillation: Secondary | ICD-10-CM | POA: Diagnosis present

## 2013-07-06 DIAGNOSIS — D62 Acute posthemorrhagic anemia: Secondary | ICD-10-CM | POA: Diagnosis present

## 2013-07-06 DIAGNOSIS — I2510 Atherosclerotic heart disease of native coronary artery without angina pectoris: Secondary | ICD-10-CM | POA: Diagnosis present

## 2013-07-06 DIAGNOSIS — Z7901 Long term (current) use of anticoagulants: Secondary | ICD-10-CM

## 2013-07-06 DIAGNOSIS — R109 Unspecified abdominal pain: Secondary | ICD-10-CM

## 2013-07-06 DIAGNOSIS — Z90411 Acquired partial absence of pancreas: Secondary | ICD-10-CM

## 2013-07-06 DIAGNOSIS — S00412A Abrasion of left ear, initial encounter: Secondary | ICD-10-CM

## 2013-07-06 LAB — CBC
HCT: 27.5 % — ABNORMAL LOW (ref 39.0–52.0)
HCT: 22.2 % — ABNORMAL LOW (ref 39.0–52.0)
Hemoglobin: 9.5 g/dL — ABNORMAL LOW (ref 13.0–17.0)
Hemoglobin: 7.5 g/dL — ABNORMAL LOW (ref 13.0–17.0)
MCH: 31.9 pg (ref 26.0–34.0)
MCHC: 34.5 g/dL (ref 30.0–36.0)
MCV: 94.5 fL (ref 78.0–100.0)
RBC: 2.91 MIL/uL — ABNORMAL LOW (ref 4.22–5.81)
RBC: 2.35 MIL/uL — ABNORMAL LOW (ref 4.22–5.81)
WBC: 13.2 10*3/uL — ABNORMAL HIGH (ref 4.0–10.5)

## 2013-07-06 LAB — CBC WITH DIFFERENTIAL/PLATELET
Basophils Absolute: 0 10*3/uL (ref 0.0–0.1)
Basophils Relative: 0 % (ref 0–1)
Eosinophils Absolute: 0.2 10*3/uL (ref 0.0–0.7)
Hemoglobin: 10.2 g/dL — ABNORMAL LOW (ref 13.0–17.0)
MCH: 32 pg (ref 26.0–34.0)
MCHC: 33.8 g/dL (ref 30.0–36.0)
Monocytes Relative: 12 % (ref 3–12)
Neutro Abs: 8.9 10*3/uL — ABNORMAL HIGH (ref 1.7–7.7)
Neutrophils Relative %: 75 % (ref 43–77)
Platelets: 177 10*3/uL (ref 150–400)

## 2013-07-06 LAB — GLUCOSE, CAPILLARY

## 2013-07-06 LAB — PROTIME-INR
INR: 2.8 — ABNORMAL HIGH (ref 0.00–1.49)
INR: 1.75 — ABNORMAL HIGH (ref 0.00–1.49)
Prothrombin Time: 28.5 seconds — ABNORMAL HIGH (ref 11.6–15.2)

## 2013-07-06 LAB — URINALYSIS, ROUTINE W REFLEX MICROSCOPIC
Glucose, UA: NEGATIVE mg/dL
Ketones, ur: NEGATIVE mg/dL
Leukocytes, UA: NEGATIVE
Nitrite: NEGATIVE
Protein, ur: NEGATIVE mg/dL
Urobilinogen, UA: 0.2 mg/dL (ref 0.0–1.0)

## 2013-07-06 LAB — BASIC METABOLIC PANEL
BUN: 22 mg/dL (ref 6–23)
Chloride: 100 mEq/L (ref 96–112)
GFR calc Af Amer: 58 mL/min — ABNORMAL LOW (ref >90)
GFR calc non Af Amer: 50 mL/min — ABNORMAL LOW (ref >90)
Potassium: 5.1 mEq/L (ref 3.5–5.1)
Sodium: 134 mEq/L — ABNORMAL LOW (ref 135–145)

## 2013-07-06 LAB — LIPASE, BLOOD: Lipase: 6 U/L — ABNORMAL LOW (ref 11–59)

## 2013-07-06 MED ORDER — SODIUM CHLORIDE 0.9 % IV SOLN: INTRAVENOUS | Status: DC

## 2013-07-06 MED ORDER — PROBIOTIC DAILY PO CAPS: 1.0000 | ORAL_CAPSULE | Freq: Every day | ORAL | Status: DC

## 2013-07-06 MED ORDER — INSULIN ASPART 100 UNIT/ML ~~LOC~~ SOLN: 0.0000 [IU] | Freq: Every day | SUBCUTANEOUS | Status: DC

## 2013-07-06 MED ORDER — PANCRELIPASE (LIP-PROT-AMYL) 12000-38000 UNITS PO CPEP
1.0000 | ORAL_CAPSULE | Freq: Three times a day (TID) | ORAL | Status: DC
  Administered 2013-07-07 – 2013-07-10 (×10): 1 via ORAL
  Filled 2013-07-06 (×17): qty 1

## 2013-07-06 MED ORDER — INSULIN ASPART 100 UNIT/ML ~~LOC~~ SOLN: 0.0000 [IU] | Freq: Three times a day (TID) | SUBCUTANEOUS | Status: DC

## 2013-07-06 MED ORDER — INSULIN DETEMIR 100 UNIT/ML ~~LOC~~ SOLN
10.0000 [IU] | Freq: Every day | SUBCUTANEOUS | Status: DC
  Administered 2013-07-06: 10 [IU] via SUBCUTANEOUS
  Filled 2013-07-06 (×2): qty 0.1

## 2013-07-06 MED ORDER — CARVEDILOL 3.125 MG PO TABS
3.1250 mg | ORAL_TABLET | Freq: Two times a day (BID) | ORAL | Status: DC
  Administered 2013-07-06 – 2013-07-10 (×7): 3.125 mg via ORAL
  Filled 2013-07-06 (×12): qty 1

## 2013-07-06 MED ORDER — ALBUTEROL SULFATE (5 MG/ML) 0.5% IN NEBU
2.5000 mg | INHALATION_SOLUTION | RESPIRATORY_TRACT | Status: DC | PRN
  Filled 2013-07-06: qty 0.5

## 2013-07-06 MED ORDER — TRAMADOL HCL 50 MG PO TABS
50.0000 mg | ORAL_TABLET | Freq: Once | ORAL | Status: AC
  Administered 2013-07-06: 50 mg via ORAL
  Filled 2013-07-06: qty 1

## 2013-07-06 MED ORDER — TRAMADOL HCL 50 MG PO TABS: 50.0000 mg | ORAL_TABLET | Freq: Four times a day (QID) | ORAL | Status: DC | PRN

## 2013-07-06 MED ORDER — ONDANSETRON HCL 4 MG PO TABS: 4.0000 mg | ORAL_TABLET | Freq: Four times a day (QID) | ORAL | Status: DC | PRN

## 2013-07-06 MED ORDER — FINASTERIDE 5 MG PO TABS
5.0000 mg | ORAL_TABLET | Freq: Every day | ORAL | Status: DC
  Administered 2013-07-06 – 2013-07-10 (×5): 5 mg via ORAL
  Filled 2013-07-06 (×6): qty 1

## 2013-07-06 MED ORDER — ONDANSETRON HCL 4 MG/2ML IJ SOLN
4.0000 mg | Freq: Four times a day (QID) | INTRAMUSCULAR | Status: DC | PRN
  Administered 2013-07-09: 4 mg via INTRAVENOUS
  Filled 2013-07-06: qty 2

## 2013-07-06 MED ORDER — VITAMIN K1 10 MG/ML IJ SOLN
1.0000 mg | Freq: Once | INTRAVENOUS | Status: AC
  Administered 2013-07-07: 1 mg via INTRAVENOUS
  Filled 2013-07-06: qty 0.1

## 2013-07-06 MED ORDER — AMLACTIN XL EX LOTN: 1.0000 "application " | TOPICAL_LOTION | Freq: Every day | CUTANEOUS | Status: DC

## 2013-07-06 MED ORDER — RISAQUAD PO CAPS
1.0000 | ORAL_CAPSULE | Freq: Every day | ORAL | Status: DC
  Administered 2013-07-07 – 2013-07-10 (×4): 1 via ORAL
  Filled 2013-07-06 (×6): qty 1

## 2013-07-06 MED ORDER — TAMSULOSIN HCL 0.4 MG PO CAPS
0.4000 mg | ORAL_CAPSULE | Freq: Two times a day (BID) | ORAL | Status: DC
  Administered 2013-07-06 – 2013-07-10 (×8): 0.4 mg via ORAL
  Filled 2013-07-06 (×11): qty 1

## 2013-07-06 MED ORDER — AMIODARONE HCL 200 MG PO TABS
200.0000 mg | ORAL_TABLET | ORAL | Status: DC
  Administered 2013-07-06 – 2013-07-09 (×3): 200 mg via ORAL
  Filled 2013-07-06 (×3): qty 1

## 2013-07-06 MED ORDER — SODIUM CHLORIDE 0.9 % IJ SOLN
3.0000 mL | Freq: Two times a day (BID) | INTRAMUSCULAR | Status: DC
  Administered 2013-07-07 – 2013-07-09 (×5): 3 mL via INTRAVENOUS

## 2013-07-06 MED ORDER — SODIUM CHLORIDE 0.9 % IV BOLUS (SEPSIS)
1000.0000 mL | Freq: Once | INTRAVENOUS | Status: AC
  Administered 2013-07-06: 1000 mL via INTRAVENOUS

## 2013-07-06 MED ORDER — IOHEXOL 300 MG/ML  SOLN
100.0000 mL | Freq: Once | INTRAMUSCULAR | Status: AC | PRN
  Administered 2013-07-06: 100 mL via INTRAVENOUS

## 2013-07-06 MED ORDER — AMMONIUM LACTATE 5 % EX LOTN
TOPICAL_LOTION | Freq: Every day | CUTANEOUS | Status: DC
  Administered 2013-07-10: 10:00:00 via TOPICAL
  Filled 2013-07-06 (×2): qty 225

## 2013-07-06 MED ORDER — INSULIN ASPART 100 UNIT/ML ~~LOC~~ SOLN
0.0000 [IU] | SUBCUTANEOUS | Status: DC
  Administered 2013-07-06: 2 [IU] via SUBCUTANEOUS
  Administered 2013-07-07 – 2013-07-08 (×2): 1 [IU] via SUBCUTANEOUS
  Administered 2013-07-08 (×2): 2 [IU] via SUBCUTANEOUS
  Administered 2013-07-08: 1 [IU] via SUBCUTANEOUS
  Administered 2013-07-09: 2 [IU] via SUBCUTANEOUS
  Administered 2013-07-09: 1 [IU] via SUBCUTANEOUS
  Administered 2013-07-09: 2 [IU] via SUBCUTANEOUS
  Administered 2013-07-09: 1 [IU] via SUBCUTANEOUS
  Administered 2013-07-10: 2 [IU] via SUBCUTANEOUS
  Administered 2013-07-10: 1 [IU] via SUBCUTANEOUS

## 2013-07-06 MED ORDER — SALINE SPRAY 0.65 % NA SOLN
1.0000 | Freq: Every day | NASAL | Status: DC
  Administered 2013-07-06 – 2013-07-08 (×2): 1 via NASAL
  Filled 2013-07-06 (×2): qty 44

## 2013-07-06 MED ORDER — VITAMIN K1 10 MG/ML IJ SOLN
1.0000 mg | Freq: Once | INTRAVENOUS | Status: AC
  Administered 2013-07-06: 1 mg via INTRAVENOUS
  Filled 2013-07-06: qty 0.1

## 2013-07-06 NOTE — ED Notes (Signed)
Pt in from independent Living [facility] via [facility], per pts wife the pts was found on the floor bedside bed this morning, +LOC, has lac to R posterior head, bleeding controlled, skin tears on L elbow & L ear, moves all extremities, in KED upon arrival, A&O x4, follows commands, speaks in complete sentences, denies pain upon arrival to ED, pt takes Coumadin for hx of A fib, diaphoretic in route, NSR in route

## 2013-07-06 NOTE — Progress Notes (Signed)
HPI:  Left sided retroperitoneal hematoma with active bleeding likely due to anticoagulation.  Current status: resting quietly, denies pain at this time.  Filed Vitals:   07/06/13 2124  BP: 136/53  Pulse: 82  Temp: 97.5 F (36.4 C)  Resp: 17    Physical Examination: General appearance - pale, alert, oriented, no acute distress  Chest - clear to auscultation, no wheezes, rales or rhonchi, symmetric air entry Heart - normal rate, regular rhythm, normal S1, S2, no murmurs, rubs, clicks or gallops Abdomen - soft, nondistended, no masses or organomegaly  Labs: (6:30 pm) WBC  13.2 Hgb      9.5 Platelets 155,000 INR pending    Impression/Plan:  Left retroperitoneal bleed. Surgery has seen patient; however does not feel surgery is emergently necessary. The hope is that the bleeding will resolve with conservative management (eg. Coumadin reversal). Will continue to monitor CBC, coags and patient status.

## 2013-07-06 NOTE — ED Provider Notes (Signed)
CSN: [id number]     Arrival date & time 07/06/13  4098 History     First MD Initiated Contact with Patient 07/06/13 [phone]     Chief Complaint  Patient presents with  . Fall   (Consider location/radiation/quality/duration/timing/severity/associated sxs/prior Treatment) HPI Comments: [name] is a 79 y.o. Male walked to the bathroom, fell and hit his ear, then got back in bed, early this morning. Later, he got out of bed, again, to go to the bathroom, fell and injured his head. At that point, he was unable to get up, so he laid there until his wife found him when she awoke this morning. It is not known if he lost consciousness. He presents complaining of pain in his head. He denies chest pain, weakness, or dizziness. He's had intermittent abdominal pain, left upper quadrant, for 2 weeks. During the night. The pain got worse and radiated to his right abdomen. He's had decreased appetite, but did not vomit yesterday. There's been no known fever, chills, or change in urinary or bowel habits. He is taking his usual medications, as prescribed. He was evaluated in ED on 06/23/2013, and admitted for abdominal pain. His evaluation at that time revealed a new adrenal mass, and splenic capsular fluid of unspecified cause. He had a short admission, his pain improved, and he plans on following up with his PCP, to evaluate the adrenal mass. There are no other known modifying factors.  Patient is a 79 y.o. male presenting with fall. The history is provided by the patient.  Fall    Past Medical History  Diagnosis Date  . Acid reflux disease     history of  . H/O: GI bleed   . Paroxysmal atrial fibrillation     chronic anticoag; tikosyn  . Benign prostatic hypertrophy     history of  . Hepatic damage march 2009    retained hepatic stone , intrahepatic duct catheter  . Diabetes mellitus     insulin dep  . CAD (coronary artery disease)   . Hypertension   . 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 Seizures   . Ventricular tachycardia ? Tikosyn proarrhtyhmia    Past Surgical History  Procedure Laterality Date  . Cardioversion    . Cholecystectomy      history of  . Partial gastrectomy      history of  . Shoulder surgery      left shoulder repair with 2 pens inserted  . Whipple procedure      operation  . Sarcoma removal from rigth tricep    . Inguinal hernia repair      inguinal herniorrhaphy, right... history of  . Appendectomy      history of  . Cardioversion  03/07/2012    Procedure: CARDIOVERSION;  Surgeon: Duke Salvia, MD;  Location: [facility] OR;  Service: Cardiovascular;  Laterality: N/A;   Family History  Problem Relation Age of Onset  . Coronary artery disease Father 60  . Heart disease Father     fatal MI  . Osteoarthritis Mother 27  . Arthritis Mother   . Coronary artery disease Brother     cabg, avr, pvd with sents  . Heart disease Brother     CABG, PVD  . Peripheral vascular disease Sister 43  . Fibromyalgia Sister   .  Arthritis Sister   . Coronary artery disease Sister   . Diabetes Sister   . Cancer Brother    History  Substance Use Topics  . Smoking status: Former Smoker    Types: Pipe    Quit date: 03/14/1991  . Smokeless tobacco: Never Used  . Alcohol Use: No    Review of Systems  All other systems reviewed and are negative.    Allergies  Clindamycin; Oxycodone-acetaminophen; and Penicillins  Home Medications   Current Outpatient Rx  Name  Route  Sig  Dispense  Refill  . amiodarone (PACERONE) 200 MG tablet   Oral   Take 1 tablet (200 mg total) by mouth as directed. Take 1 tablet by mouth five day a week.   90 tablet   3   . Ascorbic Acid (VITAMIN C) 500 MG tablet   Oral   Take 500 mg by mouth daily.          . carvedilol (COREG) 3.125 MG tablet   Oral   Take 1 tablet (3.125 mg total)  by mouth 2 (two) times daily with a meal.   180 tablet   3   . [employer] GLUCOSAMINE-CHONDROITIN PO   Oral   Take 1 tablet by mouth daily. 1200/600 mg         . Emollient (AMLACTIN XL) LOTN   Topical   Apply 1 application topically daily. Apply lotion to legs         . finasteride (PROSCAR) 5 MG tablet   Oral   Take 1 tablet (5 mg total) by mouth daily.   90 tablet   3   . insulin aspart (NOVOLOG FLEXPEN) 100 UNIT/ML injection   Subcutaneous   Inject 6 Units into the skin 3 (three) times daily before meals.   6 pen   3   . insulin detemir (LEVEMIR FLEXPEN) 100 UNIT/ML injection   Subcutaneous   Inject 10 Units into the skin at bedtime.   9 mL   3   . Insulin Pen Needle (COMFORT EZ PEN NEEDLES) 31G X 5 MM MISC   Does not apply   120 Devices by Does not apply route 4 (four) times daily.   120 each   11   . lipase/protease/amylase (CREON-10/PANCREASE) 12000 UNITS CPEP   Oral   Take 1 capsule by mouth 3 (three) times daily before meals.         Marland Kitchen lisinopril (PRINIVIL,ZESTRIL) 2.5 MG tablet   Oral   Take 1 tablet (2.5 mg total) by mouth daily.   90 tablet   3   . Multiple Vitamins-Minerals (CENTRUM SILVER PO)   Oral   Take 1 tablet by mouth daily.          . Probiotic Product (PROBIOTIC DAILY PO)   Oral   Take 1 tablet by mouth 5 (five) times daily.         Marland Kitchen SALINE NA   Each Nare   Place 1 spray into both nostrils daily.         . tamsulosin (FLOMAX) 0.4 MG CAPS   Oral   Take 1 capsule (0.4 mg total) by mouth 2 (two) times daily.   180 capsule   3   . warfarin (COUMADIN) 5 MG tablet   Oral   Take 5 mg by mouth daily. Patient states he takes 5 mg daily          BP 101/54  Pulse 71  Temp(Src) 97 F (36.1 C) (Oral)  Resp 16  SpO2 97% Physical Exam  Nursing note and vitals reviewed. Constitutional: He is oriented to person, place, and time. He appears well-developed.  Elderly, frail  HENT:  Head: Normocephalic and atraumatic.  Right  Ear: External ear normal.  Left Ear: External ear normal.  Abrasion right parietal with mild associated swelling, but no crepitation. Abrasion left ear, pinna, nonbleeding.  Eyes: Conjunctivae and EOM are normal. Pupils are equal, round, and reactive to light.  Neck: Normal range of motion and phonation normal. Neck supple.  Cardiovascular: Normal rate, regular rhythm, normal heart sounds and intact distal pulses.   Pulmonary/Chest: Effort normal and breath sounds normal. He exhibits no bony tenderness.  Abdominal: Soft. Normal appearance. There is tenderness (Left upper quadrant, mild). There is no rebound and no guarding.  Genitourinary:  Normal anus. Small amount of brown stool in the rectum. No fecal impaction.  Musculoskeletal: Normal range of motion.  Neurological: He is alert and oriented to person, place, and time. He has normal strength. No cranial nerve deficit or sensory deficit. He exhibits normal muscle tone. Coordination normal.  Skin: Skin is warm, dry and intact.  Psychiatric: He has a normal mood and affect. His behavior is normal. Judgment and thought content normal.    ED Course   Procedures (including critical care time)  Medications  sodium chloride 0.9 % bolus 1,000 mL (1,000 mLs Intravenous New Bag/Given 07/06/13 0839)  traMADol (ULTRAM) tablet 50 mg (50 mg Oral Given 07/06/13 1256)    Patient Vitals for the past 24 hrs:  BP Temp Temp src Pulse Resp SpO2  07/06/13 1428 101/54 mmHg - - 71 - -  07/06/13 1424 - - - 94 - -  07/06/13 1420 55/32 mmHg - - 99 - -  07/06/13 1403 107/49 mmHg - - 66 16 97 %  07/06/13 1215 116/57 mmHg - - 80 17 99 %  07/06/13 1127 119/60 mmHg - - 70 20 98 %  07/06/13 1029 107/47 mmHg - - 65 - 99 %  07/06/13 0937 114/56 mmHg - - 59 18 99 %  07/06/13 0829 80/40 mmHg 97 F (36.1 C) Oral 66 20 97 %  07/06/13 0824 - - - - - 97 %     Date: 07/06/13  Rate: 64  Rhythm: normal sinus rhythm  QRS Axis: normal  PR and QT Intervals: normal  ST/T  Wave abnormalities: normal  PR and QRS Conduction Disutrbances:nonspecific intraventricular conduction delay  Narrative Interpretation:   Old EKG Reviewed: unchanged- 06/22/13   2:27 PM Reevaluation with update and discussion. After initial assessment and treatment, an updated evaluation reveals physical examination is unchanged, except his blood pressure is improved, while supine. Sitting blood pressure dropped dramatically to 55/32,  he was able to stand secondary to dizziness.Tahmir Kleckner L    CRITICAL CARE Performed by: Mancel Bale L Total critical care time: 40 minutes Critical care time was exclusive of separately billable procedures and treating other patients. Critical care was necessary to treat or prevent imminent or life-threatening deterioration. Critical care was time spent personally by me on the following activities: development of treatment plan with patient and/or surrogate as well as nursing, discussions with consultants, evaluation of patient's response to treatment, examination of patient, obtaining history from patient or surrogate, ordering and performing treatments and interventions, ordering and review of laboratory studies, ordering and review of radiographic studies, pulse oximetry and re-evaluation of patient's condition.  Labs Reviewed  CBC WITH DIFFERENTIAL - Abnormal; Notable for the following:  WBC 11.8 (*)    RBC 3.19 (*)    Hemoglobin 10.2 (*)    HCT 30.2 (*)    Neutro Abs 8.9 (*)    Lymphocytes Relative 11 (*)    Monocytes Absolute 1.5 (*)    All other components within normal limits  BASIC METABOLIC PANEL - Abnormal; Notable for the following:    Sodium 134 (*)    Glucose, Bld 216 (*)    GFR calc non Af Amer 50 (*)    GFR calc Af Amer 58 (*)    All other components within normal limits  PROTIME-INR - Abnormal; Notable for the following:    Prothrombin Time 28.5 (*)    INR 2.80 (*)    All other components within normal limits  URINALYSIS,  ROUTINE W REFLEX MICROSCOPIC   Ct Head Wo Contrast  07/06/2013   *RADIOLOGY REPORT*  Clinical Data:  79 year old male with fall, head and neck injury, loss of consciousness.  Patient on Coumadin.  CT HEAD WITHOUT CONTRAST CT CERVICAL SPINE WITHOUT CONTRAST  Technique:  Multidetector CT imaging of the head and cervical spine was performed following the standard protocol without intravenous contrast.  Multiplanar CT image reconstructions of the cervical spine were also generated.  Comparison:  10/30/2012 CT  CT HEAD  Findings: Minimal chronic small vessel white matter ischemic changes are again noted. No acute intracranial abnormalities are identified, including mass lesion or mass effect, hydrocephalus, extra-axial fluid collection, midline shift, hemorrhage, or acute infarction.  The visualized bony calvarium is unremarkable. Mild right posterior scalp soft tissue swelling noted.  IMPRESSION: No evidence of acute intracranial abnormality.  Mild posterior right scalp soft tissue swelling without fracture.  CT CERVICAL SPINE  Findings: Normal alignment is noted. Accentuation of the normal cervical lordosis noted. There is no evidence of acute fracture, subluxation or prevertebral soft tissue swelling. Mild multilevel degenerative disc disease and facet arthropathy throughout the cervical spine again noted. No focal bony lesions are present. A 1.8 cm right thyroid nodule is identified - unchanged.  IMPRESSION: No static evidence of acute injury to the cervical spine.  Mild multilevel degenerative changes.  Stable 1.8 cm right thyroid nodule.   Original Report Authenticated By: Harmon Pier, M.D.   Ct Cervical Spine Wo Contrast  07/06/2013   *RADIOLOGY REPORT*  Clinical Data:  79 year old male with fall, head and neck injury, loss of consciousness.  Patient on Coumadin.  CT HEAD WITHOUT CONTRAST CT CERVICAL SPINE WITHOUT CONTRAST  Technique:  Multidetector CT imaging of the head and cervical spine was performed  following the standard protocol without intravenous contrast.  Multiplanar CT image reconstructions of the cervical spine were also generated.  Comparison:  10/30/2012 CT  CT HEAD  Findings: Minimal chronic small vessel white matter ischemic changes are again noted. No acute intracranial abnormalities are identified, including mass lesion or mass effect, hydrocephalus, extra-axial fluid collection, midline shift, hemorrhage, or acute infarction.  The visualized bony calvarium is unremarkable. Mild right posterior scalp soft tissue swelling noted.  IMPRESSION: No evidence of acute intracranial abnormality.  Mild posterior right scalp soft tissue swelling without fracture.  CT CERVICAL SPINE  Findings: Normal alignment is noted. Accentuation of the normal cervical lordosis noted. There is no evidence of acute fracture, subluxation or prevertebral soft tissue swelling. Mild multilevel degenerative disc disease and facet arthropathy throughout the cervical spine again noted. No focal bony lesions are present. A 1.8 cm right thyroid nodule is identified - unchanged.  IMPRESSION: No static evidence of  acute injury to the cervical spine.  Mild multilevel degenerative changes.  Stable 1.8 cm right thyroid nodule.   Original Report Authenticated By: Harmon Pier, M.D.   1. Fall, initial encounter   2. Head injury, initial encounter   3. Anemia   4. Abrasion of scalp, initial encounter   5. Abrasion of ear, left, initial encounter   6. Abdominal pain   7. Volume depletion   8. Hypotension     MDM  Goals with hypotension. Cause of hypotension, is not clear. Hemoglobin has dropped about 2 g from his recent baseline. No serious injuries sustained in fall. No evidence for syncope. Patient will need to be admitted for stabilization.  Nursing Notes Reviewed/ Care Coordinated, and agree without changes. Applicable Imaging Reviewed.  Interpretation of Laboratory Data incorporated into ED treatment  Plan:  Admit   Flint Melter, MD 07/06/13 1747

## 2013-07-06 NOTE — Progress Notes (Addendum)
Patient just admitted-please refer to detailed H &P.  Reviewed CT abdomen and pelvis with contrast report and discussed with radiology IMPRESSION:  - New 7.5 x 9.5 x 18.5 cm left sided hematoma, likely retroperitoneal, with active arterial extravasation.  - Small foci of gas along the medial aspect of the hematoma - no source identified. Bowel perforation is not entirely excluded.  - Enlarging left adrenal hematoma.  Assessment Likely large retroperitoneal hematoma with ongoing active bleeding, in the context of therapeutic anticoagulation Enlarging left adrenal hematoma Small foci of free abdominal air? Rule out perforated hollow viscus  Plan - Will reverse anticoagulation with 2 units of FFP and vitamin K 1 mg IV x1 dose. - Frequent CBC checks-q. 6 hourly - Continue IV fluid resuscitation. - General surgery consulted - Changed to n.p.o. status except meds and sips - Change NovoLog sliding scale insulin to every 4 hours - Change to step down status. - Alerted [facility] Link/DrVassie Loll that we may need CCM help if he declines. - D/W Dr. Dwain Sarna & Dr. Debby Bud.  [name] 07/06/13 5:17 PM

## 2013-07-06 NOTE — ED Notes (Signed)
Effie Shy, MD notified re: BP

## 2013-07-06 NOTE — ED Notes (Signed)
Pt in supine position c/o dizziness. The pt could not tolerate standing for orthostatic B/P.

## 2013-07-06 NOTE — Consult Note (Addendum)
Reason for Consult:left sided rp hematoma Referring Physician: Dr Hansel Starling is an 79 y.o. male.  HPI:  36 yom who was recently hospitalized between 06/22/13-06/24/13 for evaluation of left upper quadrant abdominal pain. CT abdomen and pelvis with contrast on July 26 showed trace amount of subcapsular fluid associated with the spleen and the 3.2 x 2.4 cm left adrenal mass. MRI abdomen 06/23/13 showed a left adrenal nodule. He was discharged to independent living [place] at [facility]. He now returns with recurrent abdominal pain and had 2 falls overnight. Patient and spouse state that since discharge from the hospital, patient has done quite well with no further pain, good appetite and improving strength until 07/05/13. Patient apparently started having left upper quadrant/left mid-abdominal pain yesterday morning that has worsened throughout the day He has no nausea/vomiting and has been having bowel movements. Patient has tried to take alleve without relief. He denies fever or chills. No melena or rectal bleeding. At approximately 2:30 AM today, he woke up to use the toilet, he fell hitting his back and head.  He does not remember falling. He denies dizziness, lightheadedness, chest pain, dyspnea or loss of consciousness. He was able to get up and returned to his bed and did not notice any bleeding. He woke up again at 6:45 AM to use the toilet and fell again without loss of consciousness. This time he was weak and unable to get up and his wife called EMS. In the ED, patient was hypotensive 55/32 which improved after IV fluids, hemoglobin 10.2 (down from 12- 2 weeks ago), WBC 11.8, INR 2.8 and creatinine 1.25 (up from 0.9). He was evaluated by hospitalists and found to have what appears to be left sided rp hematoma that is actively bleeding.   Past Medical History  Diagnosis Date  . Acid reflux disease     history of  . H/O: GI bleed   . Paroxysmal atrial fibrillation     chronic  anticoag; tikosyn  . Benign prostatic hypertrophy     history of  . Hepatic damage march 2009    retained hepatic stone , intrahepatic duct catheter  . Diabetes mellitus     insulin dep  . CAD (coronary artery disease)   . Hypertension   . 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 Seizures   . Ventricular tachycardia ? Tikosyn proarrhtyhmia     Past Surgical History  Procedure Laterality Date  . Cardioversion    . Cholecystectomy      history of  . Partial gastrectomy      history of  . Shoulder surgery      left shoulder repair with 2 pens inserted  . Whipple procedure      operation  . Sarcoma removal from rigth tricep    . Inguinal hernia repair      inguinal herniorrhaphy, right... history of  . Appendectomy      history of  . Cardioversion  03/07/2012    Procedure: CARDIOVERSION;  Surgeon: Duke Salvia, MD;  Location: [facility] OR;  Service: Cardiovascular;  Laterality: N/A;    Family History  Problem Relation Age of Onset  . Coronary artery disease Father 37  . Heart disease Father     fatal MI  . Osteoarthritis Mother 42  . Arthritis Mother   .  Coronary artery disease Brother     cabg, avr, pvd with sents  . Heart disease Brother     CABG, PVD  . Peripheral vascular disease Sister 81  . Fibromyalgia Sister   . Arthritis Sister   . Coronary artery disease Sister   . Diabetes Sister   . Cancer Brother     Social History:  reports that he quit smoking about 22 years ago. His smoking use included Pipe. He has never used smokeless tobacco. He reports that he does not drink alcohol or use illicit drugs.  Allergies:  Allergies  Allergen Reactions  . Clindamycin Other (See Comments)    Reaction unknown  . Oxycodone-Acetaminophen Other (See Comments)    Reaction unknown  . Penicillins     REACTION: causes hives     Medications: I have reviewed the patient's current medications.  Results for orders placed during the hospital encounter of 07/06/13 (from the past 48 hour(s))  CBC WITH DIFFERENTIAL     Status: Abnormal   Collection Time    07/06/13  8:46 AM      Result Value Range   WBC 11.8 (*) 4.0 - 10.5 K/uL   RBC 3.19 (*) 4.22 - 5.81 MIL/uL   Hemoglobin 10.2 (*) 13.0 - 17.0 g/dL   HCT 16.1 (*) 09.6 - 04.5 %   MCV 94.7  78.0 - 100.0 fL   MCH 32.0  26.0 - 34.0 pg   MCHC 33.8  30.0 - 36.0 g/dL   RDW 40.9  81.1 - 91.4 %   Platelets 177  150 - 400 K/uL   Neutrophils Relative % 75  43 - 77 %   Neutro Abs 8.9 (*) 1.7 - 7.7 K/uL   Lymphocytes Relative 11 (*) 12 - 46 %   Lymphs Abs 1.2  0.7 - 4.0 K/uL   Monocytes Relative 12  3 - 12 %   Monocytes Absolute 1.5 (*) 0.1 - 1.0 K/uL   Eosinophils Relative 1  0 - 5 %   Eosinophils Absolute 0.2  0.0 - 0.7 K/uL   Basophils Relative 0  0 - 1 %   Basophils Absolute 0.0  0.0 - 0.1 K/uL  BASIC METABOLIC PANEL     Status: Abnormal   Collection Time    07/06/13  8:46 AM      Result Value Range   Sodium 134 (*) 135 - 145 mEq/L   Potassium 5.1  3.5 - 5.1 mEq/L   Chloride 100  96 - 112 mEq/L   CO2 22  19 - 32 mEq/L   Glucose, Bld 216 (*) 70 - 99 mg/dL   BUN 22  6 - 23 mg/dL   Creatinine, Ser 7.82  0.50 - 1.35 mg/dL   Calcium 8.6  8.4 - 95.6 mg/dL   GFR calc non Af Amer 50 (*) >90 mL/min   GFR calc Af Amer 58 (*) >90 mL/min   Comment:            The eGFR has been calculated     using the CKD EPI equation.     This calculation has not been     validated in all clinical     situations.     eGFR's persistently     <90 mL/min signify     possible Chronic Kidney Disease.  PROTIME-INR     Status: Abnormal   Collection Time    07/06/13  8:46 AM      Result Value Range   Prothrombin Time  28.5 (*) 11.6 - 15.2 seconds   INR 2.80 (*) 0.00 - 1.49  URINALYSIS, ROUTINE W REFLEX MICROSCOPIC     Status: None   Collection Time    07/06/13 12:52 PM       Result Value Range   Color, Urine YELLOW  YELLOW   APPearance CLEAR  CLEAR   Specific Gravity, Urine 1.023  1.005 - 1.030   pH 5.0  5.0 - 8.0   Glucose, UA NEGATIVE  NEGATIVE mg/dL   Hgb urine dipstick NEGATIVE  NEGATIVE   Bilirubin Urine NEGATIVE  NEGATIVE   Ketones, ur NEGATIVE  NEGATIVE mg/dL   Protein, ur NEGATIVE  NEGATIVE mg/dL   Urobilinogen, UA 0.2  0.0 - 1.0 mg/dL   Nitrite NEGATIVE  NEGATIVE   Leukocytes, UA NEGATIVE  NEGATIVE   Comment: MICROSCOPIC NOT DONE ON URINES WITH NEGATIVE PROTEIN, BLOOD, LEUKOCYTES, NITRITE, OR GLUCOSE <1000 mg/dL.  TYPE AND SCREEN     Status: None   Collection Time    07/06/13  4:45 PM      Result Value Range   ABO/RH(D) A POS     Antibody Screen NEG     Sample Expiration 07/09/2013    PREPARE FRESH FROZEN PLASMA     Status: None   Collection Time    07/06/13  4:56 PM      Result Value Range   Unit Number [id number]     Blood Component Type THAWED PLASMA     Unit division 00     Status of Unit ALLOCATED     Transfusion Status OK TO TRANSFUSE     Unit Number [id number]     Blood Component Type THAWED PLASMA     Unit division 00     Status of Unit ALLOCATED     Transfusion Status OK TO TRANSFUSE    LIPASE, BLOOD     Status: Abnormal   Collection Time    07/06/13  5:06 PM      Result Value Range   Lipase 6 (*) 11 - 59 U/L    Ct Head Wo Contrast  07/06/2013   *RADIOLOGY REPORT*  Clinical Data:  79 year old male with fall, head and neck injury, loss of consciousness.  Patient on Coumadin.  CT HEAD WITHOUT CONTRAST CT CERVICAL SPINE WITHOUT CONTRAST  Technique:  Multidetector CT imaging of the head and cervical spine was performed following the standard protocol without intravenous contrast.  Multiplanar CT image reconstructions of the cervical spine were also generated.  Comparison:  10/30/2012 CT  CT HEAD  Findings: Minimal chronic small vessel white matter ischemic changes are again noted. No acute intracranial abnormalities are  identified, including mass lesion or mass effect, hydrocephalus, extra-axial fluid collection, midline shift, hemorrhage, or acute infarction.  The visualized bony calvarium is unremarkable. Mild right posterior scalp soft tissue swelling noted.  IMPRESSION: No evidence of acute intracranial abnormality.  Mild posterior right scalp soft tissue swelling without fracture.  CT CERVICAL SPINE  Findings: Normal alignment is noted. Accentuation of the normal cervical lordosis noted. There is no evidence of acute fracture, subluxation or prevertebral soft tissue swelling. Mild multilevel degenerative disc disease and facet arthropathy throughout the cervical spine again noted. No focal bony lesions are present. A 1.8 cm right thyroid nodule is identified - unchanged.  IMPRESSION: No static evidence of acute injury to the cervical spine.  Mild multilevel degenerative changes.  Stable 1.8 cm right thyroid nodule.   Original Report Authenticated By: Harmon Pier, M.D.  Ct Cervical Spine Wo Contrast  07/06/2013   *RADIOLOGY REPORT*  Clinical Data:  79 year old male with fall, head and neck injury, loss of consciousness.  Patient on Coumadin.  CT HEAD WITHOUT CONTRAST CT CERVICAL SPINE WITHOUT CONTRAST  Technique:  Multidetector CT imaging of the head and cervical spine was performed following the standard protocol without intravenous contrast.  Multiplanar CT image reconstructions of the cervical spine were also generated.  Comparison:  10/30/2012 CT  CT HEAD  Findings: Minimal chronic small vessel white matter ischemic changes are again noted. No acute intracranial abnormalities are identified, including mass lesion or mass effect, hydrocephalus, extra-axial fluid collection, midline shift, hemorrhage, or acute infarction.  The visualized bony calvarium is unremarkable. Mild right posterior scalp soft tissue swelling noted.  IMPRESSION: No evidence of acute intracranial abnormality.  Mild posterior right scalp soft tissue  swelling without fracture.  CT CERVICAL SPINE  Findings: Normal alignment is noted. Accentuation of the normal cervical lordosis noted. There is no evidence of acute fracture, subluxation or prevertebral soft tissue swelling. Mild multilevel degenerative disc disease and facet arthropathy throughout the cervical spine again noted. No focal bony lesions are present. A 1.8 cm right thyroid nodule is identified - unchanged.  IMPRESSION: No static evidence of acute injury to the cervical spine.  Mild multilevel degenerative changes.  Stable 1.8 cm right thyroid nodule.   Original Report Authenticated By: Harmon Pier, M.D.   Ct Abdomen Pelvis W Contrast  07/06/2013   *RADIOLOGY REPORT*  Clinical Data: 79 year old male with abdominal and pelvic pain following fall.  Drop in hemoglobin.  Patient on anticoagulation therapy.  CT ABDOMEN AND PELVIS WITH CONTRAST  Technique:  Multidetector CT imaging of the abdomen and pelvis was performed following the standard protocol during bolus administration of intravenous contrast.  Contrast: OMNIPAQUE IOHEXOL 300 MG/ML  SOLN  Comparison: 06/22/2013  Findings: There has been interval development of a 7.5 x 9.5 x 18.5 cm hematoma with internal active arterial extravasation within the left abdomen/retroperitoneum extending into the pelvis. A few small foci of extraluminal gas are identified medial to the mid and lower aspect of hematoma - uncertain source and bowel perforation is not entirely excluded. A small amount of fluid adjacent to the spleen is again noted with a stable 10 mm enhancing mass along the lateral spleen. The left adrenal abnormality has slightly increased in size now measuring 3 x 3.5 cm, previously 2.4 x 3.2 cm - likely representing an enlarging left adrenal hematoma.  Small amount of free fluid/blood within the abdomen now noted.  Pneumobilia is again identified. The kidneys bilaterally are unremarkable except for atrophy and small cysts. The right adrenal  gland and pancreas are unremarkable. Prostate enlargement is again noted. The bladder is unremarkable. No bowel abnormalities are identified.  No acute or suspicious bony abnormalities are present.  IMPRESSION: New 7.5 x 9.5 x 18.5 cm left sided hematoma, likely retroperitoneal, with active arterial extravasation.  Small foci of gas along the medial aspect of the hematoma - no source identified.  Bowel perforation is not entirely excluded.  Enlarging left adrenal hematoma.  These results were called to Dr. Waymon Amato on 07/06/2013 at 4:40 p.m.   Original Report Authenticated By: Harmon Pier, M.D.    Review of Systems  Constitutional: Negative for fever.  Respiratory: Negative for cough and shortness of breath.   Cardiovascular: Negative for chest pain.  Gastrointestinal: Positive for abdominal pain. Negative for nausea, vomiting and blood in stool.   Blood pressure 101/54,  pulse 71, temperature 97 F (36.1 C), temperature source Oral, resp. rate 16, SpO2 97.00%. Physical Exam  Vitals reviewed. Constitutional: He appears well-developed and well-nourished.  HENT:  Head: Normocephalic.  Left Ear: Left ear exhibits lacerations (abrasion left ear).  Eyes: No scleral icterus.  Cardiovascular: Normal rate, regular rhythm and normal heart sounds.   Respiratory: Effort normal and breath sounds normal. He has no wheezes.  GI: Soft. He exhibits no distension. Bowel sounds are decreased. There is tenderness in the left upper quadrant and left lower quadrant. There is no rigidity.      Assessment/Plan: Left sided retroperitoneal hematoma, ? Source (possibly left adrenal hemorrhage, spontaneous- sounds like dizziness/issues started before falls not as result of)  Discussed case with Dr Waymon Amato.  He is stable right now.  I think admission, reversal of inr as quickly as possible with close monitoring is best plan.  I have discussed case with IR also for possible intervention if necessary.  i don't think  surgery necessary now and this should hopefully resolve conservatively.  If it does should not be on coumadin for a while and likely more evaluation for source should be undertaken.I discussed this plan with the patient, his wife and his daughter.   Zuriyah Shatz 07/06/2013, 6:35 PM   Addendum: hct decreased from this am although hemodynamically unchanged, I discussed with Dr Deanne Coffer.  I think reversing inr followed by angiography is best plan and will proceed with that.

## 2013-07-06 NOTE — Progress Notes (Signed)
Admission note  Patient admitted to floor via stretcher from ED at 1717  With ED RN and transporter before nurse was able to get report from [place]. Patient alert and oriented x 4, pale skin and report light headedness. Posterior abrasion prior to admission.  See charting for additional data   Action: oriented patient  To room, initiated admission paper work, educated patient and family on POC  Response: patient safe, oriented x4

## 2013-07-06 NOTE — H&P (Addendum)
[facility] History and Physical  [name]:[id number] DOB: 1925/10/04 DOA: 07/06/2013  Referring physician: EDP PCP: Illene Regulus, MD    Chief Complaint: Abdominal pain & falls  HPI: [name] is a 79 y.o. male who was recently hospitalized between 06/22/13-06/24/13 for evaluation of left upper quadrant abdominal pain. CT abdomen and pelvis with contrast on June 22 2013 showed trace amount of subcapsular fluid associated with the spleen and the 3.2 x 2.4 cm left adrenal mass. MRI abdomen 06/23/13 showed new left adrenal nodule. No clear etiology for pain was determined. His pain subsided spontaneously. He was discharged to independent living [place] at [facility]. He now returns with recurrent abdominal pain and had 2 falls overnight. PMH significant for A. fib on Coumadin, IDDM, right upper extremity soft tissue sarcoma, pancreatic cancer status post Whipple's procedure, BPH, CAD & HTN. Patient and spouse state that since discharge from the hospital, patient has done quite well with no further pain, good appetite and improving strength until 07/05/13. Patient apparently started having left upper quadrant/left mid quadrant abdominal pain yesterday morning, gradual in onset, moderate in intensity, unsure characteristics, nonradiating, no associated nausea, vomiting, diarrhea or dysuria, or relieving factors included lying on the left side and Aleve. Pain lasted for approximately 8-10 hours and then moved across to the lower abdomen. This pain remained all night. Patient took another Aleve with no significant relief. He denies fever or chills. No melena or rectal bleeding. At approximately 2:30 AM today, he woke up to use the toilet, was unsteady and on returning fell hitting his back and may be head. He denies dizziness, lightheadedness, chest pain, dyspnea or loss of consciousness. He was able to get up and returned to his bed and did not notice any bleeding. He woke up again at 6:45 AM  to use the toilet and fell again without loss of consciousness. This time he was weak and unable to get up and his wife called EMS. In the ED, patient was hypotensive 55/32 which improved after IV fluids, hemoglobin 10.2 (down from 12- 2 weeks ago), WBC 11.8, INR 2.8 and creatinine 1.25 (up from 0.9). CT head & cervical spine without acute findings. Hospitalist admission requested.   Review of Systems: All systems reviewed and apart from history of presenting illness, are negative  Past Medical History  Diagnosis Date  . Acid reflux disease     history of  . H/O: GI bleed   . Paroxysmal atrial fibrillation     chronic anticoag; tikosyn  . Benign prostatic hypertrophy     history of  . Hepatic damage march 2009    retained hepatic stone , intrahepatic duct catheter  . Diabetes mellitus     insulin dep  . CAD (coronary artery disease)   . Hypertension   . 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 Seizures   . Ventricular tachycardia ? Tikosyn proarrhtyhmia    Past Surgical History  Procedure Laterality Date  . Cardioversion    . Cholecystectomy      history of  . Partial gastrectomy      history of  . Shoulder surgery      left shoulder repair with 2 pens inserted  . Whipple procedure      operation  . Sarcoma removal from rigth tricep    .  Inguinal hernia repair      inguinal herniorrhaphy, right... history of  . Appendectomy      history of  . Cardioversion  03/07/2012    Procedure: CARDIOVERSION;  Surgeon: Duke Salvia, MD;  Location: [facility] OR;  Service: Cardiovascular;  Laterality: N/A;   Social History:  reports that he quit smoking about 22 years ago. His smoking use included Pipe. He has never used smokeless tobacco. He reports that he does not drink alcohol or use illicit drugs. Married. Currently residing in independent living  [place] at [facility]. Independent of activities of daily living.  Allergies  Allergen Reactions  . Clindamycin Other (See Comments)    Reaction unknown  . Oxycodone-Acetaminophen Other (See Comments)    Reaction unknown  . Penicillins     REACTION: causes hives    Family History  Problem Relation Age of Onset  . Coronary artery disease Father 71  . Heart disease Father     fatal MI  . Osteoarthritis Mother 47  . Arthritis Mother   . Coronary artery disease Brother     cabg, avr, pvd with sents  . Heart disease Brother     CABG, PVD  . Peripheral vascular disease Sister 36  . Fibromyalgia Sister   . Arthritis Sister   . Coronary artery disease Sister   . Diabetes Sister   . Cancer Brother     Prior to Admission medications   Medication Sig Start Date End Date Taking? Authorizing Provider  amiodarone (PACERONE) 200 MG tablet Take 1 tablet (200 mg total) by mouth as directed. Take 1 tablet by mouth five day a week. 02/18/13 02/18/14 Yes Duke Salvia, MD  Ascorbic Acid (VITAMIN C) 500 MG tablet Take 500 mg by mouth daily.    Yes Historical Provider, MD  carvedilol (COREG) 3.125 MG tablet Take 1 tablet (3.125 mg total) by mouth 2 (two) times daily with a meal. 02/05/13 02/05/14 Yes Duke Salvia, MD  [employer] GLUCOSAMINE-CHONDROITIN PO Take 1 tablet by mouth daily. 1200/600 mg   Yes Historical Provider, MD  Emollient (AMLACTIN XL) LOTN Apply 1 application topically daily. Apply lotion to legs   Yes Historical Provider, MD  finasteride (PROSCAR) 5 MG tablet Take 1 tablet (5 mg total) by mouth daily. 05/20/13  Yes Jacques Navy, MD  insulin aspart (NOVOLOG FLEXPEN) 100 UNIT/ML injection Inject 6 Units into the skin 3 (three) times daily before meals. 11/13/12  Yes Jacques Navy, MD  insulin detemir (LEVEMIR FLEXPEN) 100 UNIT/ML injection Inject 10 Units into the skin at bedtime. 11/13/12  Yes Jacques Navy, MD  Insulin Pen Needle (COMFORT EZ PEN NEEDLES) 31G X 5 MM MISC 120  Devices by Does not apply route 4 (four) times daily. 12/07/12  Yes Jacques Navy, MD  lipase/protease/amylase (CREON-10/PANCREASE) 12000 UNITS CPEP Take 1 capsule by mouth 3 (three) times daily before meals.   Yes Historical Provider, MD  lisinopril (PRINIVIL,ZESTRIL) 2.5 MG tablet Take 1 tablet (2.5 mg total) by mouth daily. 02/05/13 02/05/14 Yes Duke Salvia, MD  Multiple Vitamins-Minerals (CENTRUM SILVER PO) Take 1 tablet by mouth daily.    Yes Historical Provider, MD  Probiotic Product (PROBIOTIC DAILY PO) Take 1 tablet by mouth 5 (five) times daily.   Yes Historical Provider, MD  SALINE NA Place 1 spray into both nostrils daily.   Yes Historical Provider, MD  tamsulosin (FLOMAX) 0.4 MG CAPS Take 1 capsule (0.4 mg total) by mouth 2 (two) times daily. 05/20/13  Yes Jacques Navy, MD  warfarin (COUMADIN) 5 MG tablet Take 5 mg by mouth daily. Patient states he takes 5 mg daily 06/11/12  Yes Jacques Navy, MD   Physical Exam: Filed Vitals:   07/06/13 1403 07/06/13 1420 07/06/13 1424 07/06/13 1428  BP: 107/49 55/32  101/54  Pulse: 66 99 94 71  Temp:      TempSrc:      Resp: 16     SpO2: 97%        General exam: Moderately built and nourished elderly frail male patient, lying comfortably supine on the gurney in no obvious distress.  Head, eyes and ENT: normocephalic. Pupils equally reacting to light and accommodation. Oral mucosa dry. Mild bruising behind the left ear and small cut on external ear without active bleeding. Patient also has a superficial laceration on the right parietal scalp which is not actively bleeding  Neck: Supple. No JVD, carotid bruit or thyromegaly.  Lymphatics: No lymphadenopathy.  Respiratory system: Clear to auscultation. No increased work of breathing.  Cardiovascular system: S1 and S2 heard, RRR. No JVD, murmurs, gallops, clicks or pedal edema.  Gastrointestinal system: Abdomen is nondistended, soft. Midline laparotomy scar. Tenderness predominantly  in the left middle quadrant but without rigidity, guarding or rebound. Normal bowel sounds heard. No organomegaly or masses appreciated.  Central nervous system: Alert and oriented. No focal neurological deficits.  Extremities: Symmetric 5 x 5 power. Peripheral pulses symmetrically felt. Multiple bruises on extremities.  Skin: No rashes or acute findings.  Musculoskeletal system: Negative exam. Patient has a large abrasion on left scapular region.  Psychiatry: Pleasant and cooperative.   Labs on Admission:  Basic Metabolic Panel:  Recent Labs Lab 07/06/13 0846  NA 134*  K 5.1  CL 100  CO2 22  GLUCOSE 216*  BUN 22  CREATININE 1.25  CALCIUM 8.6   Liver Function Tests: No results found for this basename: AST, ALT, ALKPHOS, BILITOT, PROT, ALBUMIN,  in the last 168 hours No results found for this basename: LIPASE, AMYLASE,  in the last 168 hours No results found for this basename: AMMONIA,  in the last 168 hours CBC:  Recent Labs Lab 07/06/13 0846  WBC 11.8*  NEUTROABS 8.9*  HGB 10.2*  HCT 30.2*  MCV 94.7  PLT 177   Cardiac Enzymes: No results found for this basename: CKTOTAL, CKMB, CKMBINDEX, TROPONINI,  in the last 168 hours  BNP (last 3 results) No results found for this basename: PROBNP,  in the last 8760 hours CBG: No results found for this basename: GLUCAP,  in the last 168 hours  Radiological Exams on Admission: Ct Head Wo Contrast  07/06/2013   *RADIOLOGY REPORT*  Clinical Data:  79 year old male with fall, head and neck injury, loss of consciousness.  Patient on Coumadin.  CT HEAD WITHOUT CONTRAST CT CERVICAL SPINE WITHOUT CONTRAST  Technique:  Multidetector CT imaging of the head and cervical spine was performed following the standard protocol without intravenous contrast.  Multiplanar CT image reconstructions of the cervical spine were also generated.  Comparison:  10/30/2012 CT  CT HEAD  Findings: Minimal chronic small vessel white matter ischemic changes  are again noted. No acute intracranial abnormalities are identified, including mass lesion or mass effect, hydrocephalus, extra-axial fluid collection, midline shift, hemorrhage, or acute infarction.  The visualized bony calvarium is unremarkable. Mild right posterior scalp soft tissue swelling noted.  IMPRESSION: No evidence of acute intracranial abnormality.  Mild posterior right scalp soft tissue swelling without fracture.  CT CERVICAL SPINE  Findings: Normal alignment is noted. Accentuation of the normal cervical lordosis noted. There is no evidence of acute fracture, subluxation or prevertebral soft tissue swelling. Mild multilevel degenerative disc disease and facet arthropathy throughout the cervical spine again noted. No focal bony lesions are present. A 1.8 cm right thyroid nodule is identified - unchanged.  IMPRESSION: No static evidence of acute injury to the cervical spine.  Mild multilevel degenerative changes.  Stable 1.8 cm right thyroid nodule.   Original Report Authenticated By: Harmon Pier, M.D.   Ct Cervical Spine Wo Contrast  07/06/2013   *RADIOLOGY REPORT*  Clinical Data:  79 year old male with fall, head and neck injury, loss of consciousness.  Patient on Coumadin.  CT HEAD WITHOUT CONTRAST CT CERVICAL SPINE WITHOUT CONTRAST  Technique:  Multidetector CT imaging of the head and cervical spine was performed following the standard protocol without intravenous contrast.  Multiplanar CT image reconstructions of the cervical spine were also generated.  Comparison:  10/30/2012 CT  CT HEAD  Findings: Minimal chronic small vessel white matter ischemic changes are again noted. No acute intracranial abnormalities are identified, including mass lesion or mass effect, hydrocephalus, extra-axial fluid collection, midline shift, hemorrhage, or acute infarction.  The visualized bony calvarium is unremarkable. Mild right posterior scalp soft tissue swelling noted.  IMPRESSION: No evidence of acute  intracranial abnormality.  Mild posterior right scalp soft tissue swelling without fracture.  CT CERVICAL SPINE  Findings: Normal alignment is noted. Accentuation of the normal cervical lordosis noted. There is no evidence of acute fracture, subluxation or prevertebral soft tissue swelling. Mild multilevel degenerative disc disease and facet arthropathy throughout the cervical spine again noted. No focal bony lesions are present. A 1.8 cm right thyroid nodule is identified - unchanged.  IMPRESSION: No static evidence of acute injury to the cervical spine.  Mild multilevel degenerative changes.  Stable 1.8 cm right thyroid nodule.   Original Report Authenticated By: Harmon Pier, M.D.    EKG: Independently reviewed. Sinus rhythm at 64 beats per minute without acute changes.  Assessment/Plan Principal Problem:   LUQ abdominal pain/LMQ and pain across lower abdomen. Active Problems:   DIABETES MELLITUS   ATRIAL FIBRILLATION, PAROXYSMAL   CAD (coronary artery disease)   Hypotension   Fall   Dehydration   Acute renal failure   1. Recurrent abdominal pain: Unclear etiology. Recent CT abdomen had shown small subcapsular fluid associated with spleen & CT abdomen and MRI had shown new left adrenal nodule. Patient was asymptomatic for 10-12 days and has recurrence of abdominal pain. No features of acute abdomen. There has been a 2 g drop in hemoglobin. Need to rule out intra-abdominal bleed or other acute pathology. Patient has been bolused with IV fluids. Will obtain CT abdomen and pelvis with contrast it. For now we'll hold anticoagulation. Continue tramadol for pain. Clear liquids. 2. Hypotension: Possibly secondary to intravascular volume depletion, meds and rule out bleeding. IV fluids and monitor. Better. 3. Dehydration: Brief IV fluids. 4. Acute renal failure:Likely due to dehydration & ACEI. Hold ACEI. Check Urine electrolytes. Brief IV fluids and followup BMP. 5. Paroxysmal A. fib: Currently in  sinus rhythm. Coumadin on hold until bleeding ruled out. Continue beta blockers. 6. Falls x2: Probably secondary to weakness from hypotension, anemia, dehydration and acute renal failure. PT evaluation. 7. CAD: Asymptomatic of chest pain. No acute changes on EKG. 8. IDDM: Continue Levemir. Given possibility of poor oral intake from problem #1, place on sliding scale insulin alone  and now mealtime NovoLog. 9. HTN: Management as above 10. BPH: Continue home medications.     Code Status: Full  Family Communication: Discussed with spouse and daughter at bedside  Disposition Plan: To be determined   Time spent: 60 minutes  [facility] Pager [phone]  If 7PM-7AM, please contact night-coverage [email or web address] Password [email or web address] 07/06/2013, 4:14 PM

## 2013-07-07 ENCOUNTER — Encounter (HOSPITAL_COMMUNITY): Payer: Self-pay | Admitting: Internal Medicine

## 2013-07-07 ENCOUNTER — Inpatient Hospital Stay (HOSPITAL_COMMUNITY): Payer: Medicare Other

## 2013-07-07 DIAGNOSIS — D649 Anemia, unspecified: Secondary | ICD-10-CM

## 2013-07-07 DIAGNOSIS — I959 Hypotension, unspecified: Secondary | ICD-10-CM

## 2013-07-07 DIAGNOSIS — R109 Unspecified abdominal pain: Secondary | ICD-10-CM

## 2013-07-07 DIAGNOSIS — R58 Hemorrhage, not elsewhere classified: Secondary | ICD-10-CM

## 2013-07-07 DIAGNOSIS — E119 Type 2 diabetes mellitus without complications: Secondary | ICD-10-CM

## 2013-07-07 DIAGNOSIS — N179 Acute kidney failure, unspecified: Secondary | ICD-10-CM

## 2013-07-07 DIAGNOSIS — W19XXXA Unspecified fall, initial encounter: Secondary | ICD-10-CM

## 2013-07-07 DIAGNOSIS — I2510 Atherosclerotic heart disease of native coronary artery without angina pectoris: Secondary | ICD-10-CM

## 2013-07-07 LAB — CBC
HCT: 23.2 % — ABNORMAL LOW (ref 39.0–52.0)
HCT: 26.6 % — ABNORMAL LOW (ref 39.0–52.0)
Hemoglobin: 7.9 g/dL — ABNORMAL LOW (ref 13.0–17.0)
Hemoglobin: 9.6 g/dL — ABNORMAL LOW (ref 13.0–17.0)
MCH: 32 pg (ref 26.0–34.0)
MCHC: 34.1 g/dL (ref 30.0–36.0)
MCV: 93.9 fL (ref 78.0–100.0)
MCV: 93 fL (ref 78.0–100.0)
Platelets: 121 10*3/uL — ABNORMAL LOW (ref 150–400)
RBC: 3.01 MIL/uL — ABNORMAL LOW (ref 4.22–5.81)
RBC: 2.86 MIL/uL — ABNORMAL LOW (ref 4.22–5.81)
RDW: 14.2 % (ref 11.5–15.5)
WBC: 9.8 10*3/uL (ref 4.0–10.5)

## 2013-07-07 LAB — BASIC METABOLIC PANEL
BUN: 23 mg/dL (ref 6–23)
Creatinine, Ser: 1.05 mg/dL (ref 0.50–1.35)
GFR calc Af Amer: 72 mL/min — ABNORMAL LOW (ref >90)
GFR calc non Af Amer: 62 mL/min — ABNORMAL LOW (ref >90)
Glucose, Bld: 130 mg/dL — ABNORMAL HIGH (ref 70–99)

## 2013-07-07 LAB — PREPARE RBC (CROSSMATCH)

## 2013-07-07 LAB — PREPARE FRESH FROZEN PLASMA: Unit division: 0

## 2013-07-07 LAB — GLUCOSE, CAPILLARY
Glucose-Capillary: 98 mg/dL (ref 70–99)
Glucose-Capillary: 109 mg/dL — ABNORMAL HIGH (ref 70–99)

## 2013-07-07 MED ORDER — PANTOPRAZOLE SODIUM 40 MG PO TBEC
40.0000 mg | DELAYED_RELEASE_TABLET | Freq: Every day | ORAL | Status: DC
  Administered 2013-07-07 – 2013-07-10 (×4): 40 mg via ORAL
  Filled 2013-07-07 (×4): qty 1

## 2013-07-07 MED ORDER — FENTANYL CITRATE 0.05 MG/ML IJ SOLN
INTRAMUSCULAR | Status: AC
  Filled 2013-07-07: qty 4

## 2013-07-07 MED ORDER — FENTANYL CITRATE 0.05 MG/ML IJ SOLN
INTRAMUSCULAR | Status: AC | PRN
  Administered 2013-07-07: 25 ug via INTRAVENOUS

## 2013-07-07 MED ORDER — IOHEXOL 300 MG/ML  SOLN
150.0000 mL | Freq: Once | INTRAMUSCULAR | Status: AC | PRN
  Administered 2013-07-07: 70 mL via INTRA_ARTERIAL

## 2013-07-07 MED ORDER — MIDAZOLAM HCL 2 MG/2ML IJ SOLN
INTRAMUSCULAR | Status: AC
  Filled 2013-07-07: qty 6

## 2013-07-07 MED ORDER — MIDAZOLAM HCL 2 MG/2ML IJ SOLN
INTRAMUSCULAR | Status: AC | PRN
  Administered 2013-07-07: 2 mg via INTRAVENOUS

## 2013-07-07 NOTE — Procedures (Signed)
Aortic, selective mesenteric x3 and L renal arteriography No active bleed, pseudoaneurysm, or other acute abnl No complication No blood loss. See complete dictation in [facility].

## 2013-07-07 NOTE — Progress Notes (Signed)
Please see note from Dr. Dwain Sarna. [name] is to receive an additional unit of FFP, 1 unit PRBC and Vitamin K for IR procedure. He is currently hemodynamically stable. Transfer orders for ICU after procedure. CCM aware and approved transfer.

## 2013-07-07 NOTE — Consult Note (Signed)
PULMONARY  / CRITICAL CARE MEDICINE  Name: [name] MRN: [id number] DOB: 02-16-1925    ADMISSION DATE:  07/06/2013 CONSULTATION DATE:  07/07/2013  REFERRING MD :  Dr. Ranell Patrick PRIMARY SERVICE: Hospitalist  CHIEF COMPLAINT:  Abdominal pain  BRIEF PATIENT DESCRIPTION: [name] is an 79 year old male with a retroperitoneal hematoma obtained in the setting of a fall while on Coumadin therapy. He was transferred to the critical care medicine service on the evening of 07/06/2013 for worsening anemia in the setting of a retroperitoneal bleed. Vascular and interventional radiology performed an angiogram in the early hours of 07/07/2013 but did not find an active source of bleeding.  SIGNIFICANT EVENTS / STUDIES:  1. Abdominal CT 07/06/2013 demonstrates an enlarging left-sided retroperitoneal hematoma which was previously visualized on MRI 2. Hemoglobin decreased from 10.2 -> 7.5 on the day of admission 3. Angiogram by vascular interventional radiology 07/07/2013 did not find active source of bleeding  LINES / TUBES: 1. 2x 20-gauge peripheral IVs  CULTURES: 1. None  ANTIBIOTICS: 1. None  HISTORY OF PRESENT ILLNESS:  [name] is an 79 year old male with an extensive past medical history some of the highlights of which include coronary artery disease, diabetes, atrial fibrillation on anticoagulation who presented to [facility] on 07/06/2013 with increasing abdominal pain in the setting of 2 falls. He was just discharged from [facility] at the end of July and was found to have a subcapsular hematoma of the spleen. He was discharged to a rehabilitation facility where he was doing well until 07/05/2013 when he developed abdominal pain. He subsequently fell twice in his rehabilitation facility and the pain worsened. This evening, he is unable to provide a thorough history of the events leading up to his admission, however, a review of the admission history and physical examination suggests  that he reported a pain centered in his left upper quadrant which was moderate in intensity and nonradiating. By report, he was hypotensive in the emergency room and required a bolus of intravenous fluids.   PAST MEDICAL HISTORY :  Past Medical History  Diagnosis Date  . Acid reflux disease     history of  . H/O: GI bleed   . Paroxysmal atrial fibrillation     chronic anticoag; tikosyn  . Benign prostatic hypertrophy     history of  . Hepatic damage march 2009    retained hepatic stone , intrahepatic duct catheter  . Diabetes mellitus     insulin dep  . CAD (coronary artery disease)   . Hypertension   . 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 Seizures   . Ventricular tachycardia ? Tikosyn proarrhtyhmia     Past Surgical History  Procedure Laterality Date  . Cardioversion    . Cholecystectomy      history of  . Partial gastrectomy      history of  . Shoulder surgery      left shoulder repair with 2 pens inserted  . Whipple procedure      operation  . Sarcoma removal from rigth tricep    . Inguinal hernia repair      inguinal herniorrhaphy, right... history of  . Appendectomy      history of  . Cardioversion  03/07/2012    Procedure: CARDIOVERSION;  Surgeon: Duke Salvia, MD;  Location: [facility]  OR;  Service: Cardiovascular;  Laterality: N/A;    Prior to Admission medications   Medication Sig Start Date End Date Taking? Authorizing Provider  amiodarone (PACERONE) 200 MG tablet Take 1 tablet (200 mg total) by mouth as directed. Take 1 tablet by mouth five day a week. 02/18/13 02/18/14 Yes Duke Salvia, MD  Ascorbic Acid (VITAMIN C) 500 MG tablet Take 500 mg by mouth daily.    Yes Historical Provider, MD  carvedilol (COREG) 3.125 MG tablet Take 1 tablet (3.125 mg total) by mouth 2 (two) times daily with a meal. 02/05/13 02/05/14 Yes Duke Salvia, MD  [facility] GLUCOSAMINE-CHONDROITIN PO Take 1 tablet by mouth daily. 1200/600 mg   Yes Historical Provider, MD  Emollient (AMLACTIN XL) LOTN Apply 1 application topically daily. Apply lotion to legs   Yes Historical Provider, MD  finasteride (PROSCAR) 5 MG tablet Take 1 tablet (5 mg total) by mouth daily. 05/20/13  Yes Jacques Navy, MD  insulin aspart (NOVOLOG FLEXPEN) 100 UNIT/ML injection Inject 6 Units into the skin 3 (three) times daily before meals. 11/13/12  Yes Jacques Navy, MD  insulin detemir (LEVEMIR FLEXPEN) 100 UNIT/ML injection Inject 10 Units into the skin at bedtime. 11/13/12  Yes Jacques Navy, MD  Insulin Pen Needle (COMFORT EZ PEN NEEDLES) 31G X 5 MM MISC 120 Devices by Does not apply route 4 (four) times daily. 12/07/12  Yes Jacques Navy, MD  lipase/protease/amylase (CREON-10/PANCREASE) 12000 UNITS CPEP Take 1 capsule by mouth 3 (three) times daily before meals.   Yes Historical Provider, MD  lisinopril (PRINIVIL,ZESTRIL) 2.5 MG tablet Take 1 tablet (2.5 mg total) by mouth daily. 02/05/13 02/05/14 Yes Duke Salvia, MD  Multiple Vitamins-Minerals (CENTRUM SILVER PO) Take 1 tablet by mouth daily.    Yes Historical Provider, MD  Probiotic Product (PROBIOTIC DAILY PO) Take 1 tablet by mouth 5 (five) times daily.   Yes Historical Provider, MD  SALINE NA Place 1 spray into both nostrils daily.   Yes Historical Provider, MD  tamsulosin (FLOMAX) 0.4 MG CAPS Take 1 capsule (0.4 mg total) by mouth 2 (two) times daily. 05/20/13  Yes Jacques Navy, MD  warfarin (COUMADIN) 5 MG tablet Take 5 mg by mouth daily. Patient states he takes 5 mg daily 06/11/12  Yes Jacques Navy, MD    Allergies  Allergen Reactions  . Clindamycin Other (See Comments)    Reaction unknown  . Oxycodone-Acetaminophen Other (See Comments)    Reaction unknown  . Penicillins     REACTION: causes hives    FAMILY HISTORY:  Family History  Problem Relation Age of Onset  . Coronary artery disease  Father 36  . Heart disease Father     fatal MI  . Osteoarthritis Mother 71  . Arthritis Mother   . Coronary artery disease Brother     cabg, avr, pvd with sents  . Heart disease Brother     CABG, PVD  . Peripheral vascular disease Sister 39  . Fibromyalgia Sister   . Arthritis Sister   . Coronary artery disease Sister   . Diabetes Sister   . Cancer Brother     SOCIAL HISTORY:  reports that he quit smoking about 22 years ago. His smoking use included Pipe. He has never used smokeless tobacco. He reports that he does not drink alcohol or use illicit drugs.  REVIEW OF SYSTEMS:  Unable to obtain secondary to lack of patient cooperation.  PHYSICAL EXAM  VITAL SIGNS:  Temp:  [97 F (36.1 C)-98.6 F (37 C)] 97.8 F (36.6 C) (08/10 0300) Pulse Rate:  [59-99] 80 (08/10 0400) Resp:  [12-24] 17 (08/10 0400) BP: (55-145)/(32-74) 102/47 mmHg (08/10 0400) SpO2:  [95 %-100 %] 95 % (08/10 0400)  HEMODYNAMICS:    VENTILATOR SETTINGS:    INTAKE / OUTPUT: Intake/Output     08/09 0701 - 08/10 0700   I.V. 600   Blood 1275   Total Intake 1875   Urine 550   Total Output 550   Net +1325         PHYSICAL EXAMINATION: General:  Elderly Caucasian male in no acute distress Neuro:  Grossly intact HEENT:  Conjunctiva pale, sclera anicteric, mucous membranes moist, oropharynx clear Neck:  Trachea supple in midline, no lymphadenopathy or JVD Cardiovascular:  Regular rate rhythm, normal S1-S2, no murmurs rubs or gallops Lungs:  Clear to auscultation bilaterally, normal work of breathing Abdomen:  Soft, nontender, nondistended, positive bowel sounds Musculoskeletal:  No clubbing cyanosis or edema Skin:  Intact  LABS:  CBC Recent Labs     07/06/13  0846  07/06/13  1830  07/06/13  2225  WBC  11.8*  13.2*  10.0  HGB  10.2*  9.5*  7.5*  HCT  30.2*  27.5*  22.2*  PLT  177  155  144*    Coag's Recent Labs     07/06/13  0846  07/06/13  2225  INR  2.80*  1.75*     BMET Recent Labs     07/06/13  0846  NA  134*  K  5.1  CL  100  CO2  22  BUN  22  CREATININE  1.25  GLUCOSE  216*    Electrolytes Recent Labs     07/06/13  0846  CALCIUM  8.6    Sepsis Markers No results found for this basename: LACTICACIDVEN, PROCALCITON, O2SATVEN,  in the last 72 hours  ABG No results found for this basename: PHART, PCO2ART, PO2ART,  in the last 72 hours  Liver Enzymes No results found for this basename: AST, ALT, ALKPHOS, BILITOT, ALBUMIN,  in the last 72 hours  Cardiac Enzymes No results found for this basename: TROPONINI, PROBNP,  in the last 72 hours  Glucose Recent Labs     07/06/13  2008  07/06/13  2331  GLUCAP  168*  152*    Imaging Ct Head Wo Contrast  07/06/2013   *RADIOLOGY REPORT*  Clinical Data:  79 year old male with fall, head and neck injury, loss of consciousness.  Patient on Coumadin.  CT HEAD WITHOUT CONTRAST CT CERVICAL SPINE WITHOUT CONTRAST  Technique:  Multidetector CT imaging of the head and cervical spine was performed following the standard protocol without intravenous contrast.  Multiplanar CT image reconstructions of the cervical spine were also generated.  Comparison:  10/30/2012 CT  CT HEAD  Findings: Minimal chronic small vessel white matter ischemic changes are again noted. No acute intracranial abnormalities are identified, including mass lesion or mass effect, hydrocephalus, extra-axial fluid collection, midline shift, hemorrhage, or acute infarction.  The visualized bony calvarium is unremarkable. Mild right posterior scalp soft tissue swelling noted.  IMPRESSION: No evidence of acute intracranial abnormality.  Mild posterior right scalp soft tissue swelling without fracture.  CT CERVICAL SPINE  Findings: Normal alignment is noted. Accentuation of the normal cervical lordosis noted. There is no evidence of acute fracture, subluxation or prevertebral soft tissue swelling. Mild multilevel degenerative disc disease and  facet arthropathy throughout the cervical  spine again noted. No focal bony lesions are present. A 1.8 cm right thyroid nodule is identified - unchanged.  IMPRESSION: No static evidence of acute injury to the cervical spine.  Mild multilevel degenerative changes.  Stable 1.8 cm right thyroid nodule.   Original Report Authenticated By: Harmon Pier, M.D.   Ct Cervical Spine Wo Contrast  07/06/2013   *RADIOLOGY REPORT*  Clinical Data:  79 year old male with fall, head and neck injury, loss of consciousness.  Patient on Coumadin.  CT HEAD WITHOUT CONTRAST CT CERVICAL SPINE WITHOUT CONTRAST  Technique:  Multidetector CT imaging of the head and cervical spine was performed following the standard protocol without intravenous contrast.  Multiplanar CT image reconstructions of the cervical spine were also generated.  Comparison:  10/30/2012 CT  CT HEAD  Findings: Minimal chronic small vessel white matter ischemic changes are again noted. No acute intracranial abnormalities are identified, including mass lesion or mass effect, hydrocephalus, extra-axial fluid collection, midline shift, hemorrhage, or acute infarction.  The visualized bony calvarium is unremarkable. Mild right posterior scalp soft tissue swelling noted.  IMPRESSION: No evidence of acute intracranial abnormality.  Mild posterior right scalp soft tissue swelling without fracture.  CT CERVICAL SPINE  Findings: Normal alignment is noted. Accentuation of the normal cervical lordosis noted. There is no evidence of acute fracture, subluxation or prevertebral soft tissue swelling. Mild multilevel degenerative disc disease and facet arthropathy throughout the cervical spine again noted. No focal bony lesions are present. A 1.8 cm right thyroid nodule is identified - unchanged.  IMPRESSION: No static evidence of acute injury to the cervical spine.  Mild multilevel degenerative changes.  Stable 1.8 cm right thyroid nodule.   Original Report Authenticated By: Harmon Pier,  M.D.   Ct Abdomen Pelvis W Contrast  07/06/2013   *RADIOLOGY REPORT*  Clinical Data: 79 year old male with abdominal and pelvic pain following fall.  Drop in hemoglobin.  Patient on anticoagulation therapy.  CT ABDOMEN AND PELVIS WITH CONTRAST  Technique:  Multidetector CT imaging of the abdomen and pelvis was performed following the standard protocol during bolus administration of intravenous contrast.  Contrast: OMNIPAQUE IOHEXOL 300 MG/ML  SOLN  Comparison: 06/22/2013  Findings: There has been interval development of a 7.5 x 9.5 x 18.5 cm hematoma with internal active arterial extravasation within the left abdomen/retroperitoneum extending into the pelvis. A few small foci of extraluminal gas are identified medial to the mid and lower aspect of hematoma - uncertain source and bowel perforation is not entirely excluded. A small amount of fluid adjacent to the spleen is again noted with a stable 10 mm enhancing mass along the lateral spleen. The left adrenal abnormality has slightly increased in size now measuring 3 x 3.5 cm, previously 2.4 x 3.2 cm - likely representing an enlarging left adrenal hematoma.  Small amount of free fluid/blood within the abdomen now noted.  Pneumobilia is again identified. The kidneys bilaterally are unremarkable except for atrophy and small cysts. The right adrenal gland and pancreas are unremarkable. Prostate enlargement is again noted. The bladder is unremarkable. No bowel abnormalities are identified.  No acute or suspicious bony abnormalities are present.  IMPRESSION: New 7.5 x 9.5 x 18.5 cm left sided hematoma, likely retroperitoneal, with active arterial extravasation.  Small foci of gas along the medial aspect of the hematoma - no source identified.  Bowel perforation is not entirely excluded.  Enlarging left adrenal hematoma.  These results were called to Dr. Waymon Amato on 07/06/2013 at 4:40 p.m.  Original Report Authenticated By: Harmon Pier, M.D.    EKG: None from  this hospitalization CXR: None from this hospitalization  ASSESSMENT / PLAN: Principal Problem:   Retroperitoneal hematoma Active Problems:   DIABETES MELLITUS   ATRIAL FIBRILLATION, PAROXYSMAL   Acute kidney injury   CAD (coronary artery disease)   Fall   PULMONARY A:  No acute issue  CARDIOVASCULAR A:  1. Atrial fibrillation: Unfortunately, it does not appear that Mr. Supan can be safely anticoagulated. As such, he is at increased risk for stroke. 2. CAD: No active issue 3. Transient hypotension: This is likely in the setting of mild hemorrhagic shock and has since resolved  P:   Close monitoring  Continue to hold Coumadin  RENAL A:  1. Acute renal injury: This is almost certainly secondary to hypovolemia in the setting of hemorrhagic shock  P:    Serial BMPs  GASTROINTESTINAL A:  No acute issue  HEMATOLOGIC A:   1. Retroperitoneal hematoma: This is most likely secondary to anticoagulation in the setting of recurrent falls. Unfortunately, VI R. was unable to find an inevitable lesion. As such, we will need to provide aggressive supportive care. Reversal has begun with FFP; the patient is due for a repeat INR shortly. In addition, he has gotten at least one unit of blood since his last hemoglobin was checked. His goal hemoglobin should be somewhere between 7 and 8.  P:   Repeat INR and hemoglobin at 4 AM as planned  Target near normal INR  INFECTIOUS A:  No acute issue  ENDOCRINE A:   1. Diabetes:   P:    Continue current management  NEUROLOGIC A:   No acute issue  BEST PRACTICE / DISPOSITION Level of Care:  ICU Consultants:  Surgery Code Status:  Full Diet:  N.p.o. for now DVT Px:  SCDs only GI Px:  Provided Skin Integrity:  Intact Social / Family:  Aware  TODAY'S SUMMARY: Today Mr. Mangel has gone to [employer] for an unsuccessful embolization attempt.  I have personally obtained a history, examined the patient, evaluated laboratory and  imaging results, formulated the assessment and plan and placed orders.  CRITICAL CARE: The patient is critically ill with multiple organ systems failure and requires high complexity decision making for assessment and support, frequent evaluation and titration of therapies, application of advanced monitoring technologies and extensive interpretation of multiple databases. Critical Care Time devoted to patient care services described in this note is 35 minutes.    Pulmonary and Critical Care Medicine [facility] Pager: [phone]  07/07/2013, 4:14 AM

## 2013-07-07 NOTE — Plan of Care (Signed)
Problem: Phase I Progression Outcomes Goal: OOB as tolerated unless otherwise ordered Outcome: Not Applicable Date Met:  07/07/13 Bedrest with bedside commode ordered

## 2013-07-07 NOTE — Progress Notes (Signed)
Subjective: [name] is admitted for acute abdominal pain with fall. Chart and otes reviewed. By iaging he sustained a large retroperitoneal hemorrhage. Angiography did not identify a culprit lesion for emobolization. He has been seen by surgery who feels he does not require any intervention at this time. He has been transfused several units PRBCs due to acute blood loss. He has remained hemodynamically stable.   [name] is awake and alert. He denies any pain. He repeated the history that is well documented in the admit note.   Objective: Lab:  Recent Labs  07/06/13 0846 07/06/13 1830 07/06/13 2225 07/07/13 0525  WBC 11.8* 13.2* 10.0 9.7  NEUTROABS 8.9*  --   --   --   HGB 10.2* 9.5* 7.5* 7.9*  HCT 30.2* 27.5* 22.2* 23.2*  MCV 94.7 94.5 94.5 93.9  PLT 177 155 144* 124*    Recent Labs  07/06/13 0846 07/07/13 0525  NA 134* 137  K 5.1 4.1  CL 100 103  GLUCOSE 216* 130*  BUN 22 23  CREATININE 1.25 1.05  CALCIUM 8.6 8.1*   INR 2.68 to 1.75 after FFP/Vit K  Imaging: CT head - no acute change. CT neck - no acute injury. CT abd/pelvis with large retroperitoneal hemorrhage. CT angiography negative eval of all major mesenteric vessels.   Scheduled Meds: . acidophilus  1 capsule Oral Daily  . amiodarone  200 mg Oral Custom  . ammonium lactate   Topical Daily  . carvedilol  3.125 mg Oral BID WC  . fentaNYL      . finasteride  5 mg Oral Daily  . insulin aspart  0-9 Units Subcutaneous Q4H  . insulin detemir  10 Units Subcutaneous QHS  . lipase/protease/amylase  1 capsule Oral TID AC  . midazolam      . sodium chloride  1 spray Each Nare Daily  . sodium chloride  3 mL Intravenous Q12H  . tamsulosin  0.4 mg Oral BID   Continuous Infusions:  PRN Meds:.albuterol, ondansetron (ZOFRAN) IV, ondansetron, traMADol   Physical Exam: Filed Vitals:   07/07/13 0845  BP: 98/46  Pulse: 72  Temp: 98.1 F (36.7 C)  Resp: 19  gen'l - WNWD elderly white man in no distress HEENT-  C&S clear Cor- 2+ radial pulse, RRR Pulm -  Normal respirations, no rales or wheezing Neuro - A&O x 3, speech is clear, normal cognition      Assessment/Plan: 1. Retroperitoneal hemorrhage - spontaneous hemorrhage on coumadin. No bleeding source identified. Patient with falls and weakness most likely due to acute blood loss. No surgical intervention required at this time.  Plan ICU observation  Follow H/H, next 3 hrs after transfusion  2. DM - last A1C in June was OK.  Plan Continue ss  Clear liquid diet  3. Cardiac - h/o PAF at this time off anticoagulation. HR is stable  4. HTN - mild hypotension - will hold BP meds =lisinopril while continuing coreg.  5. Renal - stable and normal Creatinine  Plan F/u Bmet   [employer] [facility] (o) [phone]; (c) [phone] Call-grp - [facility]  Tele: [phone]  07/07/2013, 9:25 AM

## 2013-07-07 NOTE — Progress Notes (Signed)
0100 Nursing Report called to 3100 RN.  Pt to transfer to 3107 post IR.  Will continue to monitor pt and assist as IR requests until pt transfers to 3100.

## 2013-07-07 NOTE — Progress Notes (Signed)
Patient ID: [name], male   DOB: [birth year], 78 y.o.   MRN: [id number] Decreasing hct, hemodynamics unchanged, inr 1.75 after 2 u ffp, will give one unit prbcs/1 more ffp.  I have discussed with Dr Deanne Coffer and he will come in to do angiography to see if can identify and treat source.  I have recommended to hospitalists he be transferred to icu also. Discussed plan with patient, I have attempted to call his wife also

## 2013-07-07 NOTE — Progress Notes (Signed)
0045 Nursing Pt to interventional radiology with RN.  Report given to IR RN.

## 2013-07-07 NOTE — Progress Notes (Signed)
Subjective: Abdominal pain has resolved  Objective: Vital signs in last 24 hours: Temp:  [97.4 F (36.3 C)-98.6 F (37 C)] 98.1 F (36.7 C) (08/10 0845) Pulse Rate:  [59-99] 72 (08/10 0845) Resp:  [12-24] 19 (08/10 0845) BP: (55-145)/(32-74) 98/46 mmHg (08/10 0845) SpO2:  [95 %-100 %] 98 % (08/10 0700) Last BM Date: 07/05/13  Intake/Output from previous day: 08/09 0701 - 08/10 0700 In: 2225 [I.V.:950; Blood:1275] Out: 700 [Urine:700] Intake/Output this shift: Total I/O In: 12.5 [Blood:12.5] Out: -   General appearance: alert and cooperative Resp: clear to auscultation bilaterally Cardio: regular rate and rhythm GI: soft, some distention, nontender  Lab Results:   Recent Labs  07/06/13 2225 07/07/13 0525  WBC 10.0 9.7  HGB 7.5* 7.9*  HCT 22.2* 23.2*  PLT 144* 124*   BMET  Recent Labs  07/06/13 0846 07/07/13 0525  NA 134* 137  K 5.1 4.1  CL 100 103  CO2 22 26  GLUCOSE 216* 130*  BUN 22 23  CREATININE 1.25 1.05  CALCIUM 8.6 8.1*   PT/INR  Recent Labs  07/06/13 2225 07/07/13 0525  LABPROT 19.9* 16.9*  INR 1.75* 1.41   ABG No results found for this basename: PHART, PCO2, PO2, HCO3,  in the last 72 hours  Studies/Results: Ct Head Wo Contrast  07/06/2013   *RADIOLOGY REPORT*  Clinical Data:  79 year old male with fall, head and neck injury, loss of consciousness.  Patient on Coumadin.  CT HEAD WITHOUT CONTRAST CT CERVICAL SPINE WITHOUT CONTRAST  Technique:  Multidetector CT imaging of the head and cervical spine was performed following the standard protocol without intravenous contrast.  Multiplanar CT image reconstructions of the cervical spine were also generated.  Comparison:  10/30/2012 CT  CT HEAD  Findings: Minimal chronic small vessel white matter ischemic changes are again noted. No acute intracranial abnormalities are identified, including mass lesion or mass effect, hydrocephalus, extra-axial fluid collection, midline shift, hemorrhage, or  acute infarction.  The visualized bony calvarium is unremarkable. Mild right posterior scalp soft tissue swelling noted.  IMPRESSION: No evidence of acute intracranial abnormality.  Mild posterior right scalp soft tissue swelling without fracture.  CT CERVICAL SPINE  Findings: Normal alignment is noted. Accentuation of the normal cervical lordosis noted. There is no evidence of acute fracture, subluxation or prevertebral soft tissue swelling. Mild multilevel degenerative disc disease and facet arthropathy throughout the cervical spine again noted. No focal bony lesions are present. A 1.8 cm right thyroid nodule is identified - unchanged.  IMPRESSION: No static evidence of acute injury to the cervical spine.  Mild multilevel degenerative changes.  Stable 1.8 cm right thyroid nodule.   Original Report Authenticated By: Harmon Pier, M.D.   Ct Cervical Spine Wo Contrast  07/06/2013   *RADIOLOGY REPORT*  Clinical Data:  79 year old male with fall, head and neck injury, loss of consciousness.  Patient on Coumadin.  CT HEAD WITHOUT CONTRAST CT CERVICAL SPINE WITHOUT CONTRAST  Technique:  Multidetector CT imaging of the head and cervical spine was performed following the standard protocol without intravenous contrast.  Multiplanar CT image reconstructions of the cervical spine were also generated.  Comparison:  10/30/2012 CT  CT HEAD  Findings: Minimal chronic small vessel white matter ischemic changes are again noted. No acute intracranial abnormalities are identified, including mass lesion or mass effect, hydrocephalus, extra-axial fluid collection, midline shift, hemorrhage, or acute infarction.  The visualized bony calvarium is unremarkable. Mild right posterior scalp soft tissue swelling noted.  IMPRESSION: No evidence of  acute intracranial abnormality.  Mild posterior right scalp soft tissue swelling without fracture.  CT CERVICAL SPINE  Findings: Normal alignment is noted. Accentuation of the normal cervical  lordosis noted. There is no evidence of acute fracture, subluxation or prevertebral soft tissue swelling. Mild multilevel degenerative disc disease and facet arthropathy throughout the cervical spine again noted. No focal bony lesions are present. A 1.8 cm right thyroid nodule is identified - unchanged.  IMPRESSION: No static evidence of acute injury to the cervical spine.  Mild multilevel degenerative changes.  Stable 1.8 cm right thyroid nodule.   Original Report Authenticated By: Harmon Pier, M.D.   Ct Abdomen Pelvis W Contrast  07/06/2013   *RADIOLOGY REPORT*  Clinical Data: 79 year old male with abdominal and pelvic pain following fall.  Drop in hemoglobin.  Patient on anticoagulation therapy.  CT ABDOMEN AND PELVIS WITH CONTRAST  Technique:  Multidetector CT imaging of the abdomen and pelvis was performed following the standard protocol during bolus administration of intravenous contrast.  Contrast: OMNIPAQUE IOHEXOL 300 MG/ML  SOLN  Comparison: 06/22/2013  Findings: There has been interval development of a 7.5 x 9.5 x 18.5 cm hematoma with internal active arterial extravasation within the left abdomen/retroperitoneum extending into the pelvis. A few small foci of extraluminal gas are identified medial to the mid and lower aspect of hematoma - uncertain source and bowel perforation is not entirely excluded. A small amount of fluid adjacent to the spleen is again noted with a stable 10 mm enhancing mass along the lateral spleen. The left adrenal abnormality has slightly increased in size now measuring 3 x 3.5 cm, previously 2.4 x 3.2 cm - likely representing an enlarging left adrenal hematoma.  Small amount of free fluid/blood within the abdomen now noted.  Pneumobilia is again identified. The kidneys bilaterally are unremarkable except for atrophy and small cysts. The right adrenal gland and pancreas are unremarkable. Prostate enlargement is again noted. The bladder is unremarkable. No bowel  abnormalities are identified.  No acute or suspicious bony abnormalities are present.  IMPRESSION: New 7.5 x 9.5 x 18.5 cm left sided hematoma, likely retroperitoneal, with active arterial extravasation.  Small foci of gas along the medial aspect of the hematoma - no source identified.  Bowel perforation is not entirely excluded.  Enlarging left adrenal hematoma.  These results were called to Dr. Waymon Amato on 07/06/2013 at 4:40 p.m.   Original Report Authenticated By: Harmon Pier, M.D.   Ir Aortagram Abdominal Serialogram  07/07/2013   *RADIOLOGY REPORT*  Clinical Data: Large Spontaneous left intra-abdominal hemorrhage while on anticoagulation.  Continued   drop in hematocrit despite reversal of the Coumadin.  SELECTIVE VISCERAL ARTERIOGRAPHY,IR RENAL SELECTIVE UNI INC S+I MODERATE SEDATION,ADDITIONAL ARTERIOGRAPHY,IR ULTRASOUND GUIDANCE VASC ACCESS RIGHT,AORTOGRAPHY  Comparison: CT 07/06/2013  Approach:  Right common femoral artery  Vessels catheterized:  The superior mesenteric artery, celiac axis, and left renal artery, inferior mesenteric artery, splenic artery  Technique: The procedure, risks (including but not limited to bleeding, infection, organ damage), benefits, and alternatives were explained to the patient.  Questions regarding the procedure were encouraged and answered.  The patient understands and consents to the procedure.Right femoral region prepped and draped usual sterile fashion. Maximal barrier sterile technique was utilized including caps, mask, sterile gowns, sterile gloves, sterile drape, hand hygiene and skin antiseptic.  Intravenous Fentanyl and Versed were administered as conscious sedation during continuous cardiorespiratory monitoring by the radiology RN, with a total moderate sedation time of 60 minutes.  Under real time ultrasound  guidance, the right common femoral artery was accessed with a 19 gauge percutaneous entry needle using single-wall technique in a single pass after the  region was infiltrated locally with 1% lidocaine.  Needle exchanged over [facility] wire for a 5-French vascular sheath, through which a 5- [place] C2 catheter was advanced and used to sequentially selectively catheterize the superior mesenteric artery, celiac axis, and left renal artery  for selective arteriography.  The C2 was exchanged for a RIM catheter, used to selectively catheterize the inferior mesenteric artery for selective arteriography.  The RIM catheter was then exchanged for a 5-French pigtail catheter for flush aortography in multiple projections.  Additional magnified breath hold oblique images were obtained after additional repeat selective   catheterization of the inferior mesenteric artery, left renal artery, superior mesenteric artery, and splenic artery.  The catheter was then removed.  After confirmatory femoral arteriography, sheath was removed and hemostasis achieved with the aid of the Exoseal device. The patient tolerated the procedure well.  No immediate complication.  Findings: No evidence of active extravasation, arterial pseudoaneurysm, or other acute arterial abnormality in the region of the left intra-abdominal hemorrhage.  No significant atheromatous plaque or stenosis.  Patency of the SMV, portal vein, and left renal vein documented on venous phase images.  IMPRESSION:  1.  No evidence of active extravasation, pseudoaneurysm, or other acute arterial abnormality to correspond to the large hemorrhage seen on CT, despite selective catheterization of multiple vascular territories in multiple projections with magnification and breath- hold technique.  I discussed the   test results over the telephone with Dr. Dwain Sarna at the time of interpretation.   Original Report Authenticated By: D. Andria Rhein, MD   Ir Angiogram Renal Left Selective  07/07/2013   *RADIOLOGY REPORT*  Clinical Data: Large Spontaneous left intra-abdominal hemorrhage while on anticoagulation.  Continued   drop in  hematocrit despite reversal of the Coumadin.  SELECTIVE VISCERAL ARTERIOGRAPHY,IR RENAL SELECTIVE UNI INC S+I MODERATE SEDATION,ADDITIONAL ARTERIOGRAPHY,IR ULTRASOUND GUIDANCE VASC ACCESS RIGHT,AORTOGRAPHY  Comparison: CT 07/06/2013  Approach:  Right common femoral artery  Vessels catheterized:  The superior mesenteric artery, celiac axis, and left renal artery, inferior mesenteric artery, splenic artery  Technique: The procedure, risks (including but not limited to bleeding, infection, organ damage), benefits, and alternatives were explained to the patient.  Questions regarding the procedure were encouraged and answered.  The patient understands and consents to the procedure.Right femoral region prepped and draped usual sterile fashion. Maximal barrier sterile technique was utilized including caps, mask, sterile gowns, sterile gloves, sterile drape, hand hygiene and skin antiseptic.  Intravenous Fentanyl and Versed were administered as conscious sedation during continuous cardiorespiratory monitoring by the radiology RN, with a total moderate sedation time of 60 minutes.  Under real time ultrasound guidance, the right common femoral artery was accessed with a 19 gauge percutaneous entry needle using single-wall technique in a single pass after the region was infiltrated locally with 1% lidocaine.  Needle exchanged over [facility] wire for a 5-French vascular sheath, through which a 5- [place] C2 catheter was advanced and used to sequentially selectively catheterize the superior mesenteric artery, celiac axis, and left renal artery  for selective arteriography.  The C2 was exchanged for a RIM catheter, used to selectively catheterize the inferior mesenteric artery for selective arteriography.  The RIM catheter was then exchanged for a 5-French pigtail catheter for flush aortography in multiple projections.  Additional magnified breath hold oblique images were obtained after additional repeat selective   catheterization  of  the inferior mesenteric artery, left renal artery, superior mesenteric artery, and splenic artery.  The catheter was then removed.  After confirmatory femoral arteriography, sheath was removed and hemostasis achieved with the aid of the Exoseal device. The patient tolerated the procedure well.  No immediate complication.  Findings: No evidence of active extravasation, arterial pseudoaneurysm, or other acute arterial abnormality in the region of the left intra-abdominal hemorrhage.  No significant atheromatous plaque or stenosis.  Patency of the SMV, portal vein, and left renal vein documented on venous phase images.  IMPRESSION:  1.  No evidence of active extravasation, pseudoaneurysm, or other acute arterial abnormality to correspond to the large hemorrhage seen on CT, despite selective catheterization of multiple vascular territories in multiple projections with magnification and breath- hold technique.  I discussed the   test results over the telephone with Dr. Dwain Sarna at the time of interpretation.   Original Report Authenticated By: D. Andria Rhein, MD   Ir Angiogram Visceral Selective  07/07/2013   *RADIOLOGY REPORT*  Clinical Data: Large Spontaneous left intra-abdominal hemorrhage while on anticoagulation.  Continued   drop in hematocrit despite reversal of the Coumadin.  SELECTIVE VISCERAL ARTERIOGRAPHY,IR RENAL SELECTIVE UNI INC S+I MODERATE SEDATION,ADDITIONAL ARTERIOGRAPHY,IR ULTRASOUND GUIDANCE VASC ACCESS RIGHT,AORTOGRAPHY  Comparison: CT 07/06/2013  Approach:  Right common femoral artery  Vessels catheterized:  The superior mesenteric artery, celiac axis, and left renal artery, inferior mesenteric artery, splenic artery  Technique: The procedure, risks (including but not limited to bleeding, infection, organ damage), benefits, and alternatives were explained to the patient.  Questions regarding the procedure were encouraged and answered.  The patient understands and consents to the  procedure.Right femoral region prepped and draped usual sterile fashion. Maximal barrier sterile technique was utilized including caps, mask, sterile gowns, sterile gloves, sterile drape, hand hygiene and skin antiseptic.  Intravenous Fentanyl and Versed were administered as conscious sedation during continuous cardiorespiratory monitoring by the radiology RN, with a total moderate sedation time of 60 minutes.  Under real time ultrasound guidance, the right common femoral artery was accessed with a 19 gauge percutaneous entry needle using single-wall technique in a single pass after the region was infiltrated locally with 1% lidocaine.  Needle exchanged over [facility] wire for a 5-French vascular sheath, through which a 5- [place] C2 catheter was advanced and used to sequentially selectively catheterize the superior mesenteric artery, celiac axis, and left renal artery  for selective arteriography.  The C2 was exchanged for a RIM catheter, used to selectively catheterize the inferior mesenteric artery for selective arteriography.  The RIM catheter was then exchanged for a 5-French pigtail catheter for flush aortography in multiple projections.  Additional magnified breath hold oblique images were obtained after additional repeat selective   catheterization of the inferior mesenteric artery, left renal artery, superior mesenteric artery, and splenic artery.  The catheter was then removed.  After confirmatory femoral arteriography, sheath was removed and hemostasis achieved with the aid of the Exoseal device. The patient tolerated the procedure well.  No immediate complication.  Findings: No evidence of active extravasation, arterial pseudoaneurysm, or other acute arterial abnormality in the region of the left intra-abdominal hemorrhage.  No significant atheromatous plaque or stenosis.  Patency of the SMV, portal vein, and left renal vein documented on venous phase images.  IMPRESSION:  1.  No evidence of active  extravasation, pseudoaneurysm, or other acute arterial abnormality to correspond to the large hemorrhage seen on CT, despite selective catheterization of multiple vascular territories in multiple projections with  magnification and breath- hold technique.  I discussed the   test results over the telephone with Dr. Dwain Sarna at the time of interpretation.   Original Report Authenticated By: D. Andria Rhein, MD   Ir Angiogram Visceral Selective  07/07/2013   *RADIOLOGY REPORT*  Clinical Data: Large Spontaneous left intra-abdominal hemorrhage while on anticoagulation.  Continued   drop in hematocrit despite reversal of the Coumadin.  SELECTIVE VISCERAL ARTERIOGRAPHY,IR RENAL SELECTIVE UNI INC S+I MODERATE SEDATION,ADDITIONAL ARTERIOGRAPHY,IR ULTRASOUND GUIDANCE VASC ACCESS RIGHT,AORTOGRAPHY  Comparison: CT 07/06/2013  Approach:  Right common femoral artery  Vessels catheterized:  The superior mesenteric artery, celiac axis, and left renal artery, inferior mesenteric artery, splenic artery  Technique: The procedure, risks (including but not limited to bleeding, infection, organ damage), benefits, and alternatives were explained to the patient.  Questions regarding the procedure were encouraged and answered.  The patient understands and consents to the procedure.Right femoral region prepped and draped usual sterile fashion. Maximal barrier sterile technique was utilized including caps, mask, sterile gowns, sterile gloves, sterile drape, hand hygiene and skin antiseptic.  Intravenous Fentanyl and Versed were administered as conscious sedation during continuous cardiorespiratory monitoring by the radiology RN, with a total moderate sedation time of 60 minutes.  Under real time ultrasound guidance, the right common femoral artery was accessed with a 19 gauge percutaneous entry needle using single-wall technique in a single pass after the region was infiltrated locally with 1% lidocaine.  Needle exchanged over [facility] wire  for a 5-French vascular sheath, through which a 5- [place] C2 catheter was advanced and used to sequentially selectively catheterize the superior mesenteric artery, celiac axis, and left renal artery  for selective arteriography.  The C2 was exchanged for a RIM catheter, used to selectively catheterize the inferior mesenteric artery for selective arteriography.  The RIM catheter was then exchanged for a 5-French pigtail catheter for flush aortography in multiple projections.  Additional magnified breath hold oblique images were obtained after additional repeat selective   catheterization of the inferior mesenteric artery, left renal artery, superior mesenteric artery, and splenic artery.  The catheter was then removed.  After confirmatory femoral arteriography, sheath was removed and hemostasis achieved with the aid of the Exoseal device. The patient tolerated the procedure well.  No immediate complication.  Findings: No evidence of active extravasation, arterial pseudoaneurysm, or other acute arterial abnormality in the region of the left intra-abdominal hemorrhage.  No significant atheromatous plaque or stenosis.  Patency of the SMV, portal vein, and left renal vein documented on venous phase images.  IMPRESSION:  1.  No evidence of active extravasation, pseudoaneurysm, or other acute arterial abnormality to correspond to the large hemorrhage seen on CT, despite selective catheterization of multiple vascular territories in multiple projections with magnification and breath- hold technique.  I discussed the   test results over the telephone with Dr. Dwain Sarna at the time of interpretation.   Original Report Authenticated By: D. Andria Rhein, MD   Ir Angiogram Visceral Selective  07/07/2013   *RADIOLOGY REPORT*  Clinical Data: Large Spontaneous left intra-abdominal hemorrhage while on anticoagulation.  Continued   drop in hematocrit despite reversal of the Coumadin.  SELECTIVE VISCERAL ARTERIOGRAPHY,IR RENAL  SELECTIVE UNI INC S+I MODERATE SEDATION,ADDITIONAL ARTERIOGRAPHY,IR ULTRASOUND GUIDANCE VASC ACCESS RIGHT,AORTOGRAPHY  Comparison: CT 07/06/2013  Approach:  Right common femoral artery  Vessels catheterized:  The superior mesenteric artery, celiac axis, and left renal artery, inferior mesenteric artery, splenic artery  Technique: The procedure, risks (including but not limited to bleeding, infection,  organ damage), benefits, and alternatives were explained to the patient.  Questions regarding the procedure were encouraged and answered.  The patient understands and consents to the procedure.Right femoral region prepped and draped usual sterile fashion. Maximal barrier sterile technique was utilized including caps, mask, sterile gowns, sterile gloves, sterile drape, hand hygiene and skin antiseptic.  Intravenous Fentanyl and Versed were administered as conscious sedation during continuous cardiorespiratory monitoring by the radiology RN, with a total moderate sedation time of 60 minutes.  Under real time ultrasound guidance, the right common femoral artery was accessed with a 19 gauge percutaneous entry needle using single-wall technique in a single pass after the region was infiltrated locally with 1% lidocaine.  Needle exchanged over [facility] wire for a 5-French vascular sheath, through which a 5- [place] C2 catheter was advanced and used to sequentially selectively catheterize the superior mesenteric artery, celiac axis, and left renal artery  for selective arteriography.  The C2 was exchanged for a RIM catheter, used to selectively catheterize the inferior mesenteric artery for selective arteriography.  The RIM catheter was then exchanged for a 5-French pigtail catheter for flush aortography in multiple projections.  Additional magnified breath hold oblique images were obtained after additional repeat selective   catheterization of the inferior mesenteric artery, left renal artery, superior mesenteric artery, and  splenic artery.  The catheter was then removed.  After confirmatory femoral arteriography, sheath was removed and hemostasis achieved with the aid of the Exoseal device. The patient tolerated the procedure well.  No immediate complication.  Findings: No evidence of active extravasation, arterial pseudoaneurysm, or other acute arterial abnormality in the region of the left intra-abdominal hemorrhage.  No significant atheromatous plaque or stenosis.  Patency of the SMV, portal vein, and left renal vein documented on venous phase images.  IMPRESSION:  1.  No evidence of active extravasation, pseudoaneurysm, or other acute arterial abnormality to correspond to the large hemorrhage seen on CT, despite selective catheterization of multiple vascular territories in multiple projections with magnification and breath- hold technique.  I discussed the   test results over the telephone with Dr. Dwain Sarna at the time of interpretation.   Original Report Authenticated By: D. Andria Rhein, MD   Ir Angiogram Selective Each Additional Vessel  07/07/2013   *RADIOLOGY REPORT*  Clinical Data: Large Spontaneous left intra-abdominal hemorrhage while on anticoagulation.  Continued   drop in hematocrit despite reversal of the Coumadin.  SELECTIVE VISCERAL ARTERIOGRAPHY,IR RENAL SELECTIVE UNI INC S+I MODERATE SEDATION,ADDITIONAL ARTERIOGRAPHY,IR ULTRASOUND GUIDANCE VASC ACCESS RIGHT,AORTOGRAPHY  Comparison: CT 07/06/2013  Approach:  Right common femoral artery  Vessels catheterized:  The superior mesenteric artery, celiac axis, and left renal artery, inferior mesenteric artery, splenic artery  Technique: The procedure, risks (including but not limited to bleeding, infection, organ damage), benefits, and alternatives were explained to the patient.  Questions regarding the procedure were encouraged and answered.  The patient understands and consents to the procedure.Right femoral region prepped and draped usual sterile fashion. Maximal  barrier sterile technique was utilized including caps, mask, sterile gowns, sterile gloves, sterile drape, hand hygiene and skin antiseptic.  Intravenous Fentanyl and Versed were administered as conscious sedation during continuous cardiorespiratory monitoring by the radiology RN, with a total moderate sedation time of 60 minutes.  Under real time ultrasound guidance, the right common femoral artery was accessed with a 19 gauge percutaneous entry needle using single-wall technique in a single pass after the region was infiltrated locally with 1% lidocaine.  Needle exchanged over [facility] wire for  a 5-French vascular sheath, through which a 5- [place] C2 catheter was advanced and used to sequentially selectively catheterize the superior mesenteric artery, celiac axis, and left renal artery  for selective arteriography.  The C2 was exchanged for a RIM catheter, used to selectively catheterize the inferior mesenteric artery for selective arteriography.  The RIM catheter was then exchanged for a 5-French pigtail catheter for flush aortography in multiple projections.  Additional magnified breath hold oblique images were obtained after additional repeat selective   catheterization of the inferior mesenteric artery, left renal artery, superior mesenteric artery, and splenic artery.  The catheter was then removed.  After confirmatory femoral arteriography, sheath was removed and hemostasis achieved with the aid of the Exoseal device. The patient tolerated the procedure well.  No immediate complication.  Findings: No evidence of active extravasation, arterial pseudoaneurysm, or other acute arterial abnormality in the region of the left intra-abdominal hemorrhage.  No significant atheromatous plaque or stenosis.  Patency of the SMV, portal vein, and left renal vein documented on venous phase images.  IMPRESSION:  1.  No evidence of active extravasation, pseudoaneurysm, or other acute arterial abnormality to correspond to the  large hemorrhage seen on CT, despite selective catheterization of multiple vascular territories in multiple projections with magnification and breath- hold technique.  I discussed the   test results over the telephone with Dr. Dwain Sarna at the time of interpretation.   Original Report Authenticated By: D. Andria Rhein, MD   Ir US Guide Vasc Access Right  07/07/2013   *RADIOLOGY REPORT*  Clinical Data: Large Spontaneous left intra-abdominal hemorrhage while on anticoagulation.  Continued   drop in hematocrit despite reversal of the Coumadin.  SELECTIVE VISCERAL ARTERIOGRAPHY,IR RENAL SELECTIVE UNI INC S+I MODERATE SEDATION,ADDITIONAL ARTERIOGRAPHY,IR ULTRASOUND GUIDANCE VASC ACCESS RIGHT,AORTOGRAPHY  Comparison: CT 07/06/2013  Approach:  Right common femoral artery  Vessels catheterized:  The superior mesenteric artery, celiac axis, and left renal artery, inferior mesenteric artery, splenic artery  Technique: The procedure, risks (including but not limited to bleeding, infection, organ damage), benefits, and alternatives were explained to the patient.  Questions regarding the procedure were encouraged and answered.  The patient understands and consents to the procedure.Right femoral region prepped and draped usual sterile fashion. Maximal barrier sterile technique was utilized including caps, mask, sterile gowns, sterile gloves, sterile drape, hand hygiene and skin antiseptic.  Intravenous Fentanyl and Versed were administered as conscious sedation during continuous cardiorespiratory monitoring by the radiology RN, with a total moderate sedation time of 60 minutes.  Under real time ultrasound guidance, the right common femoral artery was accessed with a 19 gauge percutaneous entry needle using single-wall technique in a single pass after the region was infiltrated locally with 1% lidocaine.  Needle exchanged over [facility] wire for a 5-French vascular sheath, through which a 5- [place] C2 catheter was advanced and used  to sequentially selectively catheterize the superior mesenteric artery, celiac axis, and left renal artery  for selective arteriography.  The C2 was exchanged for a RIM catheter, used to selectively catheterize the inferior mesenteric artery for selective arteriography.  The RIM catheter was then exchanged for a 5-French pigtail catheter for flush aortography in multiple projections.  Additional magnified breath hold oblique images were obtained after additional repeat selective   catheterization of the inferior mesenteric artery, left renal artery, superior mesenteric artery, and splenic artery.  The catheter was then removed.  After confirmatory femoral arteriography, sheath was removed and hemostasis achieved with the aid of the Exoseal device. The  patient tolerated the procedure well.  No immediate complication.  Findings: No evidence of active extravasation, arterial pseudoaneurysm, or other acute arterial abnormality in the region of the left intra-abdominal hemorrhage.  No significant atheromatous plaque or stenosis.  Patency of the SMV, portal vein, and left renal vein documented on venous phase images.  IMPRESSION:  1.  No evidence of active extravasation, pseudoaneurysm, or other acute arterial abnormality to correspond to the large hemorrhage seen on CT, despite selective catheterization of multiple vascular territories in multiple projections with magnification and breath- hold technique.  I discussed the   test results over the telephone with Dr. Dwain Sarna at the time of interpretation.   Original Report Authenticated By: D. Andria Rhein, MD    Anti-infectives: Anti-infectives   None      Assessment/Plan: Retroperitoneal hematoma related to anticoagulation - angio neg, coumadin reversed, should stop, no surgery needed now ABL anemia - due to above, receiving transfusion  LOS: 1 day    Thor Nannini E 07/07/2013

## 2013-07-08 LAB — GLUCOSE, CAPILLARY
Glucose-Capillary: 111 mg/dL — ABNORMAL HIGH (ref 70–99)
Glucose-Capillary: 119 mg/dL — ABNORMAL HIGH (ref 70–99)
Glucose-Capillary: 124 mg/dL — ABNORMAL HIGH (ref 70–99)
Glucose-Capillary: 136 mg/dL — ABNORMAL HIGH (ref 70–99)
Glucose-Capillary: 157 mg/dL — ABNORMAL HIGH (ref 70–99)

## 2013-07-08 LAB — TYPE AND SCREEN

## 2013-07-08 LAB — OCCULT BLOOD, POC DEVICE: Fecal Occult Bld: NEGATIVE

## 2013-07-08 LAB — CBC
HCT: 24.7 % — ABNORMAL LOW (ref 39.0–52.0)
Hemoglobin: 8.3 g/dL — ABNORMAL LOW (ref 13.0–17.0)
MCV: 92 fL (ref 78.0–100.0)
Platelets: 112 10*3/uL — ABNORMAL LOW (ref 150–400)
RBC: 2.64 MIL/uL — ABNORMAL LOW (ref 4.22–5.81)
RDW: 14.5 % (ref 11.5–15.5)
WBC: 8 10*3/uL (ref 4.0–10.5)
WBC: 7.5 10*3/uL (ref 4.0–10.5)

## 2013-07-08 LAB — PREPARE FRESH FROZEN PLASMA

## 2013-07-08 LAB — HEMOGLOBIN AND HEMATOCRIT, BLOOD: Hemoglobin: 9 g/dL — ABNORMAL LOW (ref 13.0–17.0)

## 2013-07-08 MED ORDER — POLYETHYLENE GLYCOL 3350 17 G PO PACK
17.0000 g | PACK | Freq: Every day | ORAL | Status: DC
  Administered 2013-07-08 – 2013-07-10 (×2): 17 g via ORAL
  Filled 2013-07-08 (×3): qty 1

## 2013-07-08 NOTE — Evaluation (Signed)
Physical Therapy Evaluation Patient Details Name: [name] MRN: [id number] DOB: 1924-12-16 Today's Date: 07/08/2013 Time: 5621-3086 PT Time Calculation (min): 16 min  PT Assessment / Plan / Recommendation History of Present Illness  Patient is an 79 yo male admitted with acute abdominal pain.  Patient with large retroperitoneal bleed.  Clinical Impression  Patient presents with problems listed below.  Will benefit from acute PT to maximize independence prior to discharge.    PT Assessment  Patient needs continued PT services    Follow Up Recommendations  Home health PT;Supervision/Assistance - 24 hour    Does the patient have the potential to tolerate intense rehabilitation      Barriers to Discharge        Equipment Recommendations  Rolling walker with 5" wheels    Recommendations for Other Services     Frequency Min 3X/week    Precautions / Restrictions Precautions Precautions: Fall Restrictions Weight Bearing Restrictions: No   Pertinent Vitals/Pain       Mobility  Bed Mobility Bed Mobility: Not assessed Transfers Transfers: Sit to Stand;Stand to Sit Sit to Stand: 4: Min assist;With upper extremity assist;With armrests;From chair/3-in-1 Stand to Sit: 4: Min assist;With upper extremity assist;With armrests;To chair/3-in-1 Details for Transfer Assistance: Verbal cues for hand placement.  Assist to rise to standing.  Patient stood x 3 minutes.  Was able to step in place 5 steps with each foot. Patient became fatigued.  Assist to control descent to chair. Ambulation/Gait Ambulation/Gait Assistance: Not tested (comment)    Exercises General Exercises - Lower Extremity Ankle Circles/Pumps: AROM;Both;10 reps;Seated Long Arc Quad: AROM;Both;10 reps;Seated Hip ABduction/ADduction: AROM;Both;10 reps;Seated Hip Flexion/Marching: AROM;Both;10 reps;Seated   PT Diagnosis: Difficulty walking;Generalized weakness  PT Problem List: Decreased strength;Decreased  activity tolerance;Decreased balance;Decreased mobility;Decreased knowledge of use of DME;Cardiopulmonary status limiting activity PT Treatment Interventions: DME instruction;Gait training;Functional mobility training;Therapeutic exercise;Patient/family education;Balance training     PT Goals(Current goals can be found in the care plan section) Acute Rehab PT Goals Patient Stated Goal: To get stronger PT Goal Formulation: With patient Time For Goal Achievement: 07/15/13 Potential to Achieve Goals: Good  Visit Information  Last PT Received On: 07/08/13 Assistance Needed: +2 History of Present Illness: Patient is an 79 yo male admitted with acute abdominal pain.  Patient with large retroperitoneal bleed.       Prior Functioning  Home Living Family/patient expects to be discharged to:: Assisted living (Patient reports ILF at [facility]) Living Arrangements: Spouse/significant other Available Help at Discharge: Available PRN/intermittently (Staff at [facility]) Type of Home: Apartment Home Access: Level entry (on 3rd floor) Home Layout: One level Home Equipment: Shower seat - built in Prior Function Level of Independence: Independent Comments: Receives meals and housekeeping at [employer] Communication: No difficulties Dominant Hand: Right    Cognition  Cognition Arousal/Alertness: Awake/alert Behavior During Therapy: WFL for tasks assessed/performed Overall Cognitive Status: Within Functional Limits for tasks assessed    Extremity/Trunk Assessment Upper Extremity Assessment Upper Extremity Assessment: Generalized weakness;RUE deficits/detail RUE Deficits / Details: Decreased ROM and strength due to prior injury Lower Extremity Assessment Lower Extremity Assessment: Generalized weakness   Balance Balance Balance Assessed: Yes Static Standing Balance Static Standing - Balance Support: Bilateral upper extremity supported Static Standing - Level of Assistance: 4: Min  assist Static Standing - Comment/# of Minutes: 3 minutes.  Patient required assist due to increased sway in standing and general weakness.  End of Session PT - End of Session Equipment Utilized During Treatment: Gait belt Activity Tolerance: Patient  limited by fatigue Patient left: in chair;with call bell/phone within reach Nurse Communication: Mobility status  GP     Vena Austria 07/08/2013, 6:08 PM Durenda Hurt. Renaldo Fiddler, [facility] Acute Rehab Services Pager [phone]

## 2013-07-08 NOTE — Progress Notes (Addendum)
Agree with A&P of KO,PA. If Hgb drifts more at check later today would give another PRBC. He is likely equilibrating

## 2013-07-08 NOTE — Progress Notes (Signed)
Subjective: By report a quiet night with no complaints of pain. He is awake and alert.  Objective: Lab:  Recent Labs  07/06/13 0846  07/07/13 1531 07/07/13 2000 07/08/13 0230  WBC 11.8*  < > 10.0 9.8 8.0  NEUTROABS 8.9*  --   --   --   --   HGB 10.2*  < > 9.6* 9.1* 8.3*  HCT 30.2*  < > 28.0* 26.6* 24.7*  MCV 94.7  < > 93.0 93.0 92.2  PLT 177  < > 124* 121* 113*  < > = values in this interval not displayed.  Recent Labs  07/06/13 0846 07/07/13 0525  NA 134* 137  K 5.1 4.1  CL 100 103  GLUCOSE 216* 130*  BUN 22 23  CREATININE 1.25 1.05  CALCIUM 8.6 8.1*    Imaging:  Scheduled Meds: . acidophilus  1 capsule Oral Daily  . amiodarone  200 mg Oral Custom  . ammonium lactate   Topical Daily  . carvedilol  3.125 mg Oral BID WC  . finasteride  5 mg Oral Daily  . insulin aspart  0-9 Units Subcutaneous Q4H  . lipase/protease/amylase  1 capsule Oral TID AC  . pantoprazole  40 mg Oral Daily  . sodium chloride  1 spray Each Nare Daily  . sodium chloride  3 mL Intravenous Q12H  . tamsulosin  0.4 mg Oral BID   Continuous Infusions:  PRN Meds:.albuterol, ondansetron (ZOFRAN) IV, ondansetron, traMADol   Physical Exam: Filed Vitals:   07/08/13 0600  BP: 118/49  Pulse: 84  Temp:   Resp: 16    Intake/Output Summary (Last 24 hours) at 07/08/13 0636 Last data filed at 07/08/13 0600  Gross per 24 hour  Intake   1469 ml  Output   1195 ml  Net    274 ml   Total this adm: +1,799  Gen'l- WNWD white man in no distress: no chest pain, no increased respirations Cor- 2+ radialm, quiet precordium, RRR Pulm - no increased WOB Abd- soft, hypoactive BS, no guarding or rebound. Neuro- A&O x 3      Assessment/Plan: 1. Retroperitoneal hemorrhage - continued drop in Hgb: 7.9 to 9.7 to 8.3. He is in no distress or pain. He is hemodynamically stable. Doubt CT will add information.  Plan Follow Hgb at 1400 hrs  Surgery to follow with [place]  2. DM -  CBG (last 3)   Recent  Labs  07/07/13 2007 07/08/13 0038 07/08/13 0403  GLUCAP 102* 119* 111*    Plan Continue sliding scale  3. Cardiac - holding sinus rhythm. No chest pain and BP stable  4. HTN - stable  5. Renal  - stable    Illene Regulus [place] [facility] (o) [phone]; (c) [phone] Call-grp - Patsi Sears IM  Tele: [phone]  07/08/2013, 6:27 AM

## 2013-07-09 DIAGNOSIS — I4891 Unspecified atrial fibrillation: Secondary | ICD-10-CM

## 2013-07-09 LAB — GLUCOSE, CAPILLARY
Glucose-Capillary: 120 mg/dL — ABNORMAL HIGH (ref 70–99)
Glucose-Capillary: 127 mg/dL — ABNORMAL HIGH (ref 70–99)
Glucose-Capillary: 162 mg/dL — ABNORMAL HIGH (ref 70–99)
Glucose-Capillary: 229 mg/dL — ABNORMAL HIGH (ref 70–99)

## 2013-07-09 NOTE — Progress Notes (Signed)
Retroperitoneal hematoma  Assessment: Retroperitoneal bleed, stable Mild postural hypotension  Plan: No surgical indications at this time. Likely will continue to improve Will s/o ann see again prn.   Subjective: Feels weak, and when he stands up very lightheaded. Otherwise feels he is improving  Objective: Vital signs in last 24 hours: Temp:  [97.8 F (36.6 C)-98.7 F (37.1 C)] 97.8 F (36.6 C) (08/12 0700) Pulse Rate:  [72-94] 75 (08/12 0800) Resp:  [15-27] 19 (08/12 0800) BP: (88-132)/(47-68) 129/58 mmHg (08/12 0800) SpO2:  [90 %-98 %] 95 % (08/12 0800) Weight:  [200 lb 9.9 oz (91 kg)] 200 lb 9.9 oz (91 kg) (08/12 0455) Last BM Date: 07/05/13  Intake/Output from previous day: 08/11 0701 - 08/12 0700 In: 480 [P.O.:480] Out: 1925 [Urine:1925]  General appearance: alert, cooperative, no distress and pale GI: soft, non-tender; bowel sounds normal; no masses,  no organomegaly  Lab Results:  Results for orders placed during the hospital encounter of 07/06/13 (from the past 24 hour(s))  CBC     Status: Abnormal   Collection Time    07/08/13  9:20 AM      Result Value Range   WBC 7.5  4.0 - 10.5 K/uL   RBC 2.64 (*) 4.22 - 5.81 MIL/uL   Hemoglobin 8.6 (*) 13.0 - 17.0 g/dL   HCT 29.5 (*) 28.4 - 13.2 %   MCV 92.0  78.0 - 100.0 fL   MCH 32.6  26.0 - 34.0 pg   MCHC 35.4  30.0 - 36.0 g/dL   RDW 44.0  10.2 - 72.5 %   Platelets 112 (*) 150 - 400 K/uL  GLUCOSE, CAPILLARY     Status: Abnormal   Collection Time    07/08/13 12:03 PM      Result Value Range   Glucose-Capillary 136 (*) 70 - 99 mg/dL  HEMOGLOBIN AND HEMATOCRIT, BLOOD     Status: Abnormal   Collection Time    07/08/13  2:13 PM      Result Value Range   Hemoglobin 9.0 (*) 13.0 - 17.0 g/dL   HCT 36.6 (*) 44.0 - 34.7 %  GLUCOSE, CAPILLARY     Status: Abnormal   Collection Time    07/08/13  3:49 PM      Result Value Range   Glucose-Capillary 178 (*) 70 - 99 mg/dL   Comment 1 Notify RN     Comment 2  Documented in Chart    GLUCOSE, CAPILLARY     Status: Abnormal   Collection Time    07/08/13  8:13 PM      Result Value Range   Glucose-Capillary 157 (*) 70 - 99 mg/dL   Comment 1 Notify RN     Comment 2 Documented in Chart    GLUCOSE, CAPILLARY     Status: Abnormal   Collection Time    07/08/13 11:53 PM      Result Value Range   Glucose-Capillary 120 (*) 70 - 99 mg/dL   Comment 1 Notify RN    GLUCOSE, CAPILLARY     Status: Abnormal   Collection Time    07/09/13  4:09 AM      Result Value Range   Glucose-Capillary 127 (*) 70 - 99 mg/dL   Comment 1 Call MD NNP PA CNM    HEMOGLOBIN AND HEMATOCRIT, BLOOD     Status: Abnormal   Collection Time    07/09/13  4:10 AM      Result Value Range   Hemoglobin 8.5 (*)  13.0 - 17.0 g/dL   HCT 16.1 (*) 09.6 - 04.5 %  GLUCOSE, CAPILLARY     Status: Abnormal   Collection Time    07/09/13  7:52 AM      Result Value Range   Glucose-Capillary 133 (*) 70 - 99 mg/dL     Studies/Results Radiology     MEDS, Scheduled . acidophilus  1 capsule Oral Daily  . amiodarone  200 mg Oral Custom  . ammonium lactate   Topical Daily  . carvedilol  3.125 mg Oral BID WC  . finasteride  5 mg Oral Daily  . insulin aspart  0-9 Units Subcutaneous Q4H  . lipase/protease/amylase  1 capsule Oral TID AC  . pantoprazole  40 mg Oral Daily  . polyethylene glycol  17 g Oral Daily  . sodium chloride  1 spray Each Nare Daily  . sodium chloride  3 mL Intravenous Q12H  . tamsulosin  0.4 mg Oral BID       LOS: 3 days    Currie Paris, MD, [facility] Surgery, [place] [phone]   07/09/2013 8:59 AM

## 2013-07-09 NOTE — Progress Notes (Signed)
Subjective: [name] reports he did not sleep well. No pain or discomfort. Had BM x 2.  Objective: Lab:  Recent Labs  07/06/13 0846  07/07/13 2000 07/08/13 0230 07/08/13 0920 07/08/13 1413 07/09/13 0410  WBC 11.8*  < > 9.8 8.0 7.5  --   --   NEUTROABS 8.9*  --   --   --   --   --   --   HGB 10.2*  < > 9.1* 8.3* 8.6* 9.0* 8.5*  HCT 30.2*  < > 26.6* 24.7* 24.3* 26.1* 24.6*  MCV 94.7  < > 93.0 92.2 92.0  --   --   PLT 177  < > 121* 113* 112*  --   --   < > = values in this interval not displayed.  Recent Labs  07/06/13 0846 07/07/13 0525  NA 134* 137  K 5.1 4.1  CL 100 103  GLUCOSE 216* 130*  BUN 22 23  CREATININE 1.25 1.05  CALCIUM 8.6 8.1*    Imaging: No new imaging  Scheduled Meds: . acidophilus  1 capsule Oral Daily  . amiodarone  200 mg Oral Custom  . ammonium lactate   Topical Daily  . carvedilol  3.125 mg Oral BID WC  . finasteride  5 mg Oral Daily  . insulin aspart  0-9 Units Subcutaneous Q4H  . lipase/protease/amylase  1 capsule Oral TID AC  . pantoprazole  40 mg Oral Daily  . polyethylene glycol  17 g Oral Daily  . sodium chloride  1 spray Each Nare Daily  . sodium chloride  3 mL Intravenous Q12H  . tamsulosin  0.4 mg Oral BID   Continuous Infusions:  PRN Meds:.albuterol, ondansetron (ZOFRAN) IV, ondansetron, traMADol   Physical Exam: Filed Vitals:   07/09/13 0500  BP: 124/61  Pulse: 82  Temp:   Resp:     Intake/Output Summary (Last 24 hours) at 07/09/13 0649 Last data filed at 07/09/13 0500  Gross per 24 hour  Intake    480 ml  Output   1925 ml  Net  -1445 ml   Total this adm: +354  Gen'l - WNWD white man HEENT - normal Cor- 2+ radial, RRR PUlm - normal respirations Abd - BS+, soft, not tender Neuro - A&O   Assessment/Plan: 1. Retroperitoneal hemorrhage - Hgb has been stable with some mild variation. No pain. Has an appetite  Plan Hgb this PM  Transfer to tele  2. DM -  CBG (last 3)   Recent Labs  07/08/13 2013  07/08/13 2353 07/09/13 0409  GLUCAP 157* 120* 127*    Plan Restart basal insulin  Continue ss  Advance diet  3. Cardiac - holding sinus rhythm  Plan Will start aspirin at time of d/c  4. HTN- Stable  5. Renal - no new lab   [employer] IM (o) [phone]; (c) [phone] Call-grp - Patsi Sears IM  Tele: [phone]  07/09/2013, 6:44 AM

## 2013-07-09 NOTE — Care Management Note (Signed)
Case management following for progression and d/c needs. Noted orders for [facility] and HHPT, will arrange prior to d/c.  Johny Shock RN MPH Case Manager [phone]

## 2013-07-09 NOTE — Progress Notes (Signed)
Physical Therapy Treatment Patient Details Name: [name] MRN: [id number] DOB: [birth year] Today's Date: 07/09/2013 Time: 4098-1191 PT Time Calculation (min): 25 min  PT Assessment / Plan / Recommendation  History of Present Illness Patient is an 79 yo male admitted with acute abdominal pain.  Patient with large retroperitoneal bleed.   PT Comments   Pt admitted with large retroperitoneal bleed. Pt currently with functional limitations due to continued weakness and orthostasis today with mobility.  Pt will benefit from skilled PT to increase their independence and safety with mobility to allow discharge to the venue listed below.   Follow Up Recommendations  Home health PT;Supervision/Assistance - 24 hour                 Equipment Recommendations  Rolling walker with 5" wheels        Frequency Min 3X/week   Progress towards PT Goals Progress towards PT goals: Progressing toward goals  Plan Current plan remains appropriate    Precautions / Restrictions Precautions Precautions: Fall Restrictions Weight Bearing Restrictions: No      Pertinent Vitals/Pain Orthostatic BPs  Supine NT  Sitting 129/58   Standing   66/32 - symptomatic  Sitting  105/53  Standing second attempt   71/31- symptomatic  BP end of session  85/42 -asymptomatic      Mobility  Bed Mobility Bed Mobility: Not assessed Transfers Transfers: Sit to Stand;Stand to Sit Sit to Stand: 4: Min assist;With upper extremity assist;With armrests;From chair/3-in-1 Stand to Sit: 4: Min assist;With upper extremity assist;With armrests;To chair/3-in-1 Details for Transfer Assistance: Resting BP was 129/58.  Verbal cues for hand placement.  Assist to rise to standing.  Patient stood x 3 minutes first attempt.  Pt reported dizziness with BP 66/32.  Pt rested with BP once sitting 105/53.  Pt stood a second time for 2 minutes and marched in place with BP 71/31.  Assist to control descent to chair.  Nursing came in and  asked PT if I could get pt in the chair.  Pt took a few steps to the chair.  See below.   Ambulation/Gait Ambulation/Gait Assistance: 4: Min assist Ambulation Distance (Feet): 8 Feet Assistive device: Rolling walker Ambulation/Gait Assistance Details: Pt ambulated with RW with cues to sequence steps and RW.  Pt slightly unsteady needing steadying assist at times.  BP once pt sat was 85/42 with pt stating that his dizziness was better with this attempt at ambulation.  Nursing aware of decr BP.   Gait Pattern: Step-through pattern;Decreased stride length;Narrow base of support Gait velocity: decreased Stairs: No Wheelchair Mobility Wheelchair Mobility: No    Exercises General Exercises - Lower Extremity Ankle Circles/Pumps: AROM;Both;10 reps;Seated Long Arc Quad: AROM;Both;10 reps;Seated Hip ABduction/ADduction: AROM;Both;10 reps;Seated Hip Flexion/Marching: AROM;Both;10 reps;Seated    PT Goals (current goals can now be found in the care plan section)    Visit Information  Last PT Received On: 07/09/13 Assistance Needed: +2 (for safety with ambulation) History of Present Illness: Patient is an 79 yo male admitted with acute abdominal pain.  Patient with large retroperitoneal bleed.    Subjective Data  Subjective: "I feel so weak when I stand."   Cognition  Cognition Arousal/Alertness: Awake/alert Behavior During Therapy: WFL for tasks assessed/performed Overall Cognitive Status: Within Functional Limits for tasks assessed    Balance  Balance Balance Assessed: Yes Static Standing Balance Static Standing - Balance Support: Bilateral upper extremity supported Static Standing - Level of Assistance: 4: Min assist Static Standing - Comment/# of  Minutes: 3 minu with assist needed for steadying pt.    End of Session PT - End of Session Equipment Utilized During Treatment: Gait belt Activity Tolerance: Patient limited by fatigue Patient left: in chair;with call bell/phone within  reach Nurse Communication: Mobility status        INGOLD,Shruti Arrey 07/09/2013, 10:46 AM [facility] [phone] (pager)

## 2013-07-10 ENCOUNTER — Encounter: Payer: Medicare Other | Admitting: Internal Medicine

## 2013-07-10 LAB — GLUCOSE, CAPILLARY: Glucose-Capillary: 154 mg/dL — ABNORMAL HIGH (ref 70–99)

## 2013-07-10 NOTE — Progress Notes (Signed)
Physical Therapy Treatment Patient Details Name: SHADEE MONTOYA MRN: [id number] DOB: February 20, 1925 Today's Date: 07/10/2013 Time: 4098-1191 PT Time Calculation (min): 27 min  PT Assessment / Plan / Recommendation  History of Present Illness Patient is an 78 yo male admitted with acute abdominal pain.  Patient with large retroperitoneal bleed.   PT Comments   Pt feeling much better and no dizziness with mobility.  Pt feels comfortable returning to [facility] with his spouse and does not feel he needs f/u PT upon d/c however would like RW for safety at home.   Follow Up Recommendations  Home health PT;Supervision for mobility/OOB     Does the patient have the potential to tolerate intense rehabilitation     Barriers to Discharge        Equipment Recommendations  Rolling walker with 5" wheels    Recommendations for Other Services    Frequency     Progress towards PT Goals Progress towards PT goals: Progressing toward goals  Plan Current plan remains appropriate    Precautions / Restrictions Precautions Precautions: Fall   Pertinent Vitals/Pain n/a    Mobility  Bed Mobility Bed Mobility: Supine to Sit Supine to Sit: 6: Modified independent (Device/Increase time) Transfers Transfers: Sit to Stand;Stand to Sit Sit to Stand: 4: Min guard;From bed Stand to Sit: 5: Supervision;To chair/3-in-1;With armrests Details for Transfer Assistance: verbal cues for technique, min/guard for safety due to BP issues yesterday however pt reports no dizziness today Ambulation/Gait Ambulation/Gait Assistance: 4: Min guard Ambulation Distance (Feet): 800 Feet Assistive device: Rolling walker Ambulation/Gait Assistance Details: pt reports never using RW, so educated in safe gait using RW including verbal cues for hand placement and RW distance Gait Pattern: Step-through pattern;Trunk flexed;Decreased stride length    Exercises General Exercises - Lower Extremity Long Arc Quad: AROM;Both;15  reps;Seated Hip ABduction/ADduction: AROM;Both;15 reps Straight Leg Raises: AROM;15 reps;Both Hip Flexion/Marching: AROM;Seated;Both;15 reps   PT Diagnosis:    PT Problem List:   PT Treatment Interventions:     PT Goals (current goals can now be found in the care plan section)    Visit Information  Last PT Received On: 07/10/13 Assistance Needed: +1 History of Present Illness: Patient is an 79 yo male admitted with acute abdominal pain.  Patient with large retroperitoneal bleed.    Subjective Data      Cognition  Cognition Arousal/Alertness: Awake/alert Behavior During Therapy: WFL for tasks assessed/performed Overall Cognitive Status: Within Functional Limits for tasks assessed    Balance     End of Session PT - End of Session Equipment Utilized During Treatment: Gait belt Activity Tolerance: Patient tolerated treatment well Patient left: in chair;with call bell/phone within reach   GP     Mateen Franssen,KATHrine E 07/10/2013, 10:48 AM Zenovia Jarred, PT, DPT 07/10/2013 Pager: [phone]

## 2013-07-10 NOTE — Discharge Summary (Signed)
NAMEMarland Kitchen  MIKYLE, [name] NO.:  [id number]  MEDICAL RECORD NO.:  [id number]  LOCATION:  6E03C                        FACILITY:  [facility]  PHYSICIAN:  Rosalyn Gess. Onie Kasparek, MD  DATE OF BIRTH:  [birth year]  DATE OF ADMISSION:  07/06/2013 DATE OF DISCHARGE:  07/10/2013                              DISCHARGE SUMMARY   ADMITTING DIAGNOSES: 1. Retroperitoneal bleed. 2. Hypotension secondary to retroperitoneal bleed. 3. Dehydration. 4. Acute renal failure. 5. PAF. 6. Falls. 7. Coronary artery disease. 8. Insulin-dependent diabetes mellitus. 9. Hypertension. 10.Benign prostatic hypertrophy.  DISCHARGE DIAGNOSES: 1. Retroperitoneal bleed. 2. Hypotension secondary to retroperitoneal bleed. 3. Dehydration. 4. Acute renal failure. 5. PAF. 6. Falls. 7. Coronary artery disease. 8. Insulin-dependent diabetes mellitus. 9. Hypertension. 10.Benign prostatic hypertrophy.  CONSULTANTS:  Dr. Janee Morn for General surgery.  PROCEDURES:  Imaging: 1. CT scan of the cervical spine performed on July 06, 2013, with no     static evidence of acute injury to the cervical spine.  Mild     multilevel degenerative changes noted.  Stable 1.8-cm right thyroid     nodule. 2. CT of the brain, no evidence of acute intracranial abnormality.     Mild posterior right scalp soft tissue swelling without fracture. 3. CT of the abdomen and pelvis performed on July 06, 2013.  Patient     with a 7.5 x 9.5 x 18.5 cm hematoma with internal active arterial     extravasation within the left abdomen/retroperitoneum extending     into the pelvis.  Small amount of fluid adjacent to the spleen is     again noted, with a stable 10 mm enhancing mass along the lateral     spleen.  Left adrenal abnormality slightly increased in size     measuring 3 x 3.5 cm previously 2.4 x 3.2 cm, likely representing     enlarging left adrenal adenoma.  Pneumobilia is noted. 4. Selective visceral arteriography performed on  July 07, 2013.     There was no evidence of active extravasation, pseudoaneurysm, or     other acute arterial abnormality to correspond large hemorrhage     seen on CT.  Despite selective catheterization of multiple vascular     territories and multiple projections with magnification and breath     hold technique, these findings were stable.  HISTORY OF PRESENT ILLNESS:  Mr. Alsop is an 79 year old gentleman, recently hospitalized on June 22, 2013, to June 24, 2013, for left upper abdominal pain.  CT of the abdomen and pelvis showed trace amount of subscapular fluid associated with the spleen and 3.2 x 2.4 cm left adrenal mass.  MRI of the abdomen showed a new left adrenal nodule.  The patient's pain subsided spontaneously, and he was discharged to independent living at [facility].  The patient returned on the day of admission with a recurrent acute abdominal pain.  He also had 2 falls overnight when he got lightheaded with standing.  Past medical history significant for atrial fibrillation on Coumadin, diabetes, right upper extremity soft tissue sarcoma, pancreatic cancer status post Whipple procedure, BPH, CAD, hypertension.  In the interval since hospital discharge, the patient had done quite well  with no pain, had good appetite, improving strength.  He started having left upper quadrant and midquadrant abdominal pain, the day prior to admission, that was gradual in onset, moderate in intensity, that was not radiating with no associated nausea, vomiting, diarrhea, dysuria, or relieving factors.  The patient did take some Aleve to help relieve his pain.  He reports waking at 2:30 a.m. to use the toilet, was unsteady on his feet, and on returning, he fell hitting the back of his head and his neck.  He was able to get up and returned to bed.  He woke again at 6:45 a.m., and fell again without loss of consciousness.  He presented to the emergency department after these falls and  due to his weakness and was found to have a very large retroperitoneal hematoma as noted.  Please see the H and P for past medical history, family history, social history, and admission physical exam.  HOSPITAL COURSE: 1. Retroperitoneal hematoma.  The patient had a thorough evaluation     including angiography that was unrevealing for a bleeding source.     The patient was seen by General Surgery.  The patient was     hemodynamically stable and was not felt to need acute intervention.     Serial H and H was followed and the patient did stabilize.     Hemoglobin was 12 g on June 23, 2013.  At the time of admission, he     had dropped to 7.5 g.  The patient was transfused a total of 3     units of blood during this admission.  Hemoglobin rose to 9.7 g.     He remained relatively stable with a slight drop to 8.3 g on July 08, 2013, and 8.6 g on July 08, 2013,  9 g on July 08, 2013 at     1400 hours,  8.5 g July 09, 2013 at 0400 hours, 9.3 g at 1600     hours on July 09, 2013.  Patient had no recurrent pain or discomfort.  He was able to take a diet.  He was able to sit and stand, although slightly lightheaded. Surgery felt the patient was stable and did not require any further intervention.  With the patient's hemoglobin being stable with no abdominal pain or discomfort with negative evaluation as noted with no plan for further intervention, at this point, he is ready for discharge to home.  1. Diabetes.  Patient remains stable.  He was on sliding scale during     his initial hospital stay, but was restarted on his basal insulin     and will resume his home insulin regimen. 2. Cardiac.  The patient has had sinus rhythm during this hospital     stay.  Given the fact that he had a major retroperitoneal bleed, we     will not restart Coumadin.  We will consider starting aspirin after     seen in followup to be sure his hemoglobin remained stable.  He is     aware of the  increased risk of stroke without anticoagulation. 3. Hypertension.  The patient is stable.  Final blood pressure on the     day of discharge was 121/45.  This was taken at 0649 hours with the     doctor being present. 4. Renal.  The patient has remained stable.  His creatinine rose to a     high of 1.25.  At the  time of discharge, creatinine was 1.05.  DISCHARGE EXAMINATION:  VITAL SIGNS:  Temperature was 98, blood pressure 121/45, heart rate 77, respirations 20, and oxygen saturation 94% on room air. GENERAL APPEARANCE:  This is a pleasant older gentleman looking younger than his stated chronologic age, and in no acute distress. HEENT:  Conjunctivae and sclerae were clear.  Pupils equal, round, and reactive. PULMONARY:  Patient has no increased work of breathing.  He is moving air well.  No rales or wheezes are appreciated. CARDIOVASCULAR:  2+ radial pulse in right and left wrists.  Precordium is quiet.  Heart rate was regular on my exam. ABDOMEN:  Soft.  No guarding or rebound.  No tenderness to palpation. NEURO:  Patient is awake and alert.  He is oriented to person, place, time, and context.  FINAL LABORATORY:  Chemistry on July 07, 2013, with sodium 137, potassium 4.1, chloride 103, CO2 of 26, BUN 23, creatinine is 1.05, glucose was 130.  Cardiac enzymes had a slight bump on admission at 0.33, on repeat was less than 0.3.  Final hemoglobin was 9.3 g on July 09, 2013.  Final INR was 1.41 on July 07, 2013.  The patient's last A1c was on 06/22/2013 was 6.3%.  DISCHARGE MEDICATIONS: 1. Pacerone 200 mg once daily. 2. Vitamin C 500 mg daily. 3. Carvedilol 3.125 mg b.i.d. 4. Glucosamine and chondroitin daily. 5. AmLactin moisturizing lotion as needed. 6. Proscar 5 mg daily. 7. NovoLog 6 units before each meal. 8. Levemir 10 units subcu at bedtime. 9. Creon-10 12,000 units CP EP 1 capsule 3 times daily before meals. 10.Lisinopril 2.5 mg daily. 11.Multivitamins  daily. 12.Probiotic daily. 13.Saline nasal spray as needed. 14.Tamsulosin 0.4 mg at bedtime.  DISPOSITION:  The patient is discharged back to [facility].  He has no significant limitations on his activities but should be thoughtful and allow himself to convalesce.  FOLLOWUP:  The patient will be seen in the office on Monday, July 15, 2013.  The patient is asked to call Dr. Odessa Fleming office to schedule followup appointment.  The patient's condition at the time of discharge dictation is stable and improved with the need for close followup.     Rosalyn Gess Terris Germano, MD     MEN/MEDQ  D:  07/10/2013  T:  07/10/2013  Job:  [id number]  cc:   Duke Salvia, MD, [facility]

## 2013-07-10 NOTE — Care Management Note (Signed)
   CARE MANAGEMENT NOTE 07/10/2013  Patient:  [name], [name]   Account Number:  [id number]  Date Initiated:  07/09/2013  Documentation initiated by:  Cecila Satcher  Subjective/Objective Assessment:   Orders for [facility] and HHPT     Action/Plan:   Attempted to see pt serveral times , multiple visitors, will return to see pt re HH needs tomorrow.  07/10/2013 Met with pt and contacted [facility] re d/c needs.   Anticipated DC Date:  07/10/2013   Anticipated DC Plan:  HOME W HOME HEALTH SERVICES         Choice offered to / List presented to:          [facility] arranged  HH-1 RN  HH-2 PT      [facility] agency  [facility]   Status of service:  Completed, signed off Medicare Important Message given?   (If response is "NO", the following Medicare IM given date fields will be blank) Date Medicare IM given:   Date Additional Medicare IM given:    Discharge Disposition:  HOME W HOME HEALTH SERVICES  Per UR Regulation:    If discussed at Long Length of Stay Meetings, dates discussed:    Comments:  07/10/2013 Pt aware of [facility] and agreeable to have them provide HHPT. Await return call from [facility] re [facility] provider. Will arrange this for pt, and have ordered rolling walker per PT recommendation and pt request. Johny Shock RN MPH Case manager [phone] received call from [place] at [facility] who states that they prefer [facility] for [facility] and pt was in agreement to any agency that [facility] recommended. Thea Silversmith notified. CRoyal RN MPH

## 2013-07-10 NOTE — Care Management Note (Signed)
   CARE MANAGEMENT NOTE 07/10/2013  Patient:  KERNEY, HOPFENSPERGER   Account Number:  [id number]  Date Initiated:  07/09/2013  Documentation initiated by:  Monee Dembeck  Subjective/Objective Assessment:   Orders for [facility] and HHPT     Action/Plan:   Attempted to see pt serveral times , multiple visitors, will return to see pt re HH needs tomorrow.  07/10/2013 Met with pt and contacted [facility] re d/c needs.   Anticipated DC Date:  07/10/2013   Anticipated DC Plan:  HOME W HOME HEALTH SERVICES         Choice offered to / List presented to:          [facility] arranged  HH-1 RN  HH-2 PT      Status of service:  In process, will continue to follow Medicare Important Message given?   (If response is "NO", the following Medicare IM given date fields will be blank) Date Medicare IM given:   Date Additional Medicare IM given:    Discharge Disposition:  HOME W HOME HEALTH SERVICES  Per UR Regulation:    If discussed at Long Length of Stay Meetings, dates discussed:    Comments:  07/10/2013 Pt aware of [facility] and agreeable to have them provide HHPT. Await return call from [facility] re [facility] provider. Will arrange this for pt, and have ordered rolling walker per PT recommendation and pt request. Johny Shock RN MPH Case manager [phone]

## 2013-07-10 NOTE — Progress Notes (Signed)
Subjective: Feels ok. PT never did see him in follow up. He does feel stable enough to return home  Objective: Lab:  Recent Labs  07/07/13 2000 07/08/13 0230 07/08/13 0920 07/08/13 1413 07/09/13 0410 07/09/13 1614  WBC 9.8 8.0 7.5  --   --   --   HGB 9.1* 8.3* 8.6* 9.0* 8.5* 9.3*  HCT 26.6* 24.7* 24.3* 26.1* 24.6* 27.0*  MCV 93.0 92.2 92.0  --   --   --   PLT 121* 113* 112*  --   --   --    No results found for this basename: NA, K, CL, CO3, GLUCOSE, BUN, CREATININE, CALCIUM, MG, PHOS,  in the last 72 hours  Imaging:  Scheduled Meds: . acidophilus  1 capsule Oral Daily  . amiodarone  200 mg Oral Custom  . ammonium lactate   Topical Daily  . carvedilol  3.125 mg Oral BID WC  . finasteride  5 mg Oral Daily  . insulin aspart  0-9 Units Subcutaneous Q4H  . lipase/protease/amylase  1 capsule Oral TID AC  . pantoprazole  40 mg Oral Daily  . polyethylene glycol  17 g Oral Daily  . sodium chloride  1 spray Each Nare Daily  . sodium chloride  3 mL Intravenous Q12H  . tamsulosin  0.4 mg Oral BID   Continuous Infusions:  PRN Meds:.albuterol, ondansetron (ZOFRAN) IV, ondansetron, traMADol   Physical Exam: Filed Vitals:   07/09/13 2016  BP: 105/70  Pulse: 79  Temp: 98.3 F (36.8 C)  Resp: 20    See d/c summary    Assessment/Plan: For d/c home see d/c summary  Dictated # [id number]  [facility] (o) [phone]; (c) [phone] Call-grp - [facility]  Tele: [phone]  07/10/2013, 6:41 AM

## 2013-07-12 ENCOUNTER — Telehealth: Payer: Self-pay | Admitting: Internal Medicine

## 2013-07-12 ENCOUNTER — Telehealth: Payer: Self-pay | Admitting: General Practice

## 2013-07-12 NOTE — Telephone Encounter (Signed)
New Prob  Pt states he was seen in the hospital and they took him off the warfarin. He wants to know what does he need to take now.

## 2013-07-12 NOTE — Telephone Encounter (Signed)
Spoke with pt, according to the discharge summary the pt will need to see dr Graciela Husbands for that decision to be made. appt for dr Graciela Husbands moved up. CVRR appt cx. The pt is to see dr Debby Bud next week. Pt agreed with that appt.

## 2013-07-12 NOTE — Telephone Encounter (Signed)
Transitional care call:  Patient dc'd from hospital on 8/13.  Discharge diagnosis:  Retroperitoneal bleed.  Spoke with patient directly.  Patient states that he is weak but that appetite is good and bowels are moving OK.    Patient does state that he is short of breath on ambulation but is able to breath well when sitting.  Patient denies questions regarding hospital discharge instruction.  RN reviewed medications with patient and patient has all medications in the home.  Patient denies questions for Dr. Debby Bud at the moment.  Patient lives with his wife.  Follow up appointment with Dr. Debby Bud on Monday, 8/18 @ 3:30.

## 2013-07-15 ENCOUNTER — Encounter: Payer: Self-pay | Admitting: Internal Medicine

## 2013-07-15 ENCOUNTER — Other Ambulatory Visit (INDEPENDENT_AMBULATORY_CARE_PROVIDER_SITE_OTHER): Payer: Medicare Other

## 2013-07-15 ENCOUNTER — Telehealth: Payer: Self-pay | Admitting: *Deleted

## 2013-07-15 ENCOUNTER — Ambulatory Visit (INDEPENDENT_AMBULATORY_CARE_PROVIDER_SITE_OTHER): Payer: Medicare Other | Admitting: Internal Medicine

## 2013-07-15 VITALS — BP 108/60 | HR 74 | Temp 98.5°F | Wt 198.0 lb

## 2013-07-15 DIAGNOSIS — E119 Type 2 diabetes mellitus without complications: Secondary | ICD-10-CM

## 2013-07-15 DIAGNOSIS — K661 Hemoperitoneum: Secondary | ICD-10-CM

## 2013-07-15 LAB — HEMOGLOBIN AND HEMATOCRIT, BLOOD
HCT: 30 % — ABNORMAL LOW (ref 39.0–52.0)
Hemoglobin: 10 g/dL — ABNORMAL LOW (ref 13.0–17.0)

## 2013-07-15 MED ORDER — INSULIN ASPART 100 UNIT/ML ~~LOC~~ SOLN: 6.0000 [IU] | Freq: Three times a day (TID) | SUBCUTANEOUS | Status: DC

## 2013-07-15 NOTE — Patient Instructions (Addendum)
You seem to be doing well. Will check a hemoglobin today with results on MyChart.  No blood thinner, not even aspirin at this time. Perhaps in several more weeks will start low dose aspirin.  Blood sugars look good and I will note the change in your before lunch dose.    Iron-Rich Diet An iron-rich diet contains foods that are good sources of iron. Iron is an important mineral that helps your body produce hemoglobin. Hemoglobin is a protein in red blood cells that carries oxygen to the body's tissues. Sometimes, the iron level in your blood can be low. This may be caused by:  A lack of iron in your diet.  Blood loss.  Times of growth, such as during pregnancy or during a child's growth and development. Low levels of iron can cause a decrease in the number of red blood cells. This can result in iron deficiency anemia. Iron deficiency anemia symptoms include:  Tiredness.  Weakness.  Irritability.  Increased chance of infection. Here are some recommendations for daily iron intake:  Males older than 77 years of age need 8 mg of iron per day.  Women ages 68 to 72 need 18 mg of iron per day.  Pregnant women need 27 mg of iron per day, and women who are over 87 years of age and breastfeeding need 9 mg of iron per day.  Women over the age of 27 need 8 mg of iron per day. SOURCES OF IRON There are 2 types of iron that are found in food: heme iron and nonheme iron. Heme iron is absorbed by the body better than nonheme iron. Heme iron is found in meat, poultry, and fish. Nonheme iron is found in grains, beans, and vegetables. Heme Iron Sources Food / Iron (mg)  Chicken liver, 3 oz (85 g)/ 10 mg  Beef liver, 3 oz (85 g)/ 5.5 mg  Oysters, 3 oz (85 g)/ 8 mg  Beef, 3 oz (85 g)/ 2 to 3 mg  Shrimp, 3 oz (85 g)/ 2.8 mg  [place], 3 oz (85 g)/ 2 mg  Chicken, 3 oz (85 g) / 1 mg  Fish (tuna, halibut), 3 oz (85 g)/ 1 mg  Pork, 3 oz (85 g)/ 0.9 mg Nonheme Iron Sources Food / Iron  (mg)  Ready-to-eat breakfast cereal, iron-fortified / 3.9 to 7 mg  Tofu,  cup / 3.4 mg  Kidney beans,  cup / 2.6 mg  Baked potato with skin / 2.7 mg  Asparagus,  cup / 2.2 mg  Avocado / 2 mg  Dried peaches,  cup / 1.6 mg  Raisins,  cup / 1.5 mg  Soy milk, 1 cup / 1.5 mg  Whole-wheat bread, 1 slice / 1.2 mg  Spinach, 1 cup / 0.8 mg  Broccoli,  cup / 0.6 mg IRON ABSORPTION Certain foods can decrease the body's absorption of iron. Try to avoid these foods and beverages while eating meals with iron-containing foods:  Coffee.  Tea.  Fiber.  Soy. Foods containing vitamin C can help increase the amount of iron your body absorbs from iron sources, especially from nonheme sources. Eat foods with vitamin C along with iron-containing foods to increase your iron absorption. Foods that are high in vitamin C include many fruits and vegetables. Some good sources are:  Fresh orange juice.  Oranges.  Strawberries.  Mangoes.  Grapefruit.  Red bell peppers.  Green bell peppers.  Broccoli.  Potatoes with skin.  Tomato juice. Document Released: 06/28/2005 Document Revised: 02/06/2012  Document Reviewed: 05/05/2011 [facility] Patient Information 2014 [place], [place].

## 2013-07-15 NOTE — Assessment & Plan Note (Signed)
Hospital Aug '14 for large retroperitoneal hemorrhage - no point source of bleeding identified. Received 3 units PRBCs. He is still feeling and gets SOB with activity  Plan Iron rich diet  H/H today - recommendations to follow  Recheck H/H in 3-4 weeks  No blood thinners at this time. Will resume ASA therapy when fully recovered. He is aware of increased stroke risk vs bleeding risk.

## 2013-07-15 NOTE — Telephone Encounter (Signed)
Spoke with Leslie advised of MDs message 

## 2013-07-15 NOTE — Telephone Encounter (Signed)
Dr. Debby Bud was the attending physician and did do the d/c summary.Will follow through as attending as an outpatient.

## 2013-07-15 NOTE — Assessment & Plan Note (Signed)
CBG record is stable with a few high excursions and one or two lows necessitating a reduction in before lunch insulin to 5 units.

## 2013-07-15 NOTE — Telephone Encounter (Signed)
Verlon Au from [facility] called states pt was discharged from hospital with home health orders from hospitalist, requests whether Dr Debby Bud will sign plan of care and orders.  Please advise

## 2013-07-15 NOTE — Progress Notes (Signed)
  Subjective:    Patient ID: [name], male    DOB: 09/13/1925, 79 y.o.   MRN: [id number]  HPI Mr. Gillentine presents for hospital follow up with a recent episode of anemia and syncope related to a LARGE retroperitoneal hemorrhage. He did receive 3 units of blood. He did not require any surgical intervention. At the time of d/c his Hgb was stable at 9.3. He is still feeling a bit weak and he does get short of breath fairly easily. He has not had any recurrent abdominal pain nor any near-syncope or syncope.  He reports that his blood sugars have been relatively stable but he did reduce the before lunch insulin dose to 5 units.   PMH, FamHx and SocHx reviewed for any changes and relevance.  Current Outpatient Prescriptions on File Prior to Visit  Medication Sig Dispense Refill  . amiodarone (PACERONE) 200 MG tablet Take 1 tablet (200 mg total) by mouth as directed. Take 1 tablet by mouth five day a week.  90 tablet  3  . Ascorbic Acid (VITAMIN C) 500 MG tablet Take 500 mg by mouth daily.       . carvedilol (COREG) 3.125 MG tablet Take 1 tablet (3.125 mg total) by mouth 2 (two) times daily with a meal.  180 tablet  3  . [facility] GLUCOSAMINE-CHONDROITIN PO Take 1 tablet by mouth daily. 1200/600 mg      . Emollient (AMLACTIN XL) LOTN Apply 1 application topically daily. Apply lotion to legs      . finasteride (PROSCAR) 5 MG tablet Take 1 tablet (5 mg total) by mouth daily.  90 tablet  3  . insulin detemir (LEVEMIR FLEXPEN) 100 UNIT/ML injection Inject 10 Units into the skin at bedtime.  9 mL  3  . Insulin Pen Needle (COMFORT EZ PEN NEEDLES) 31G X 5 MM MISC 120 Devices by Does not apply route 4 (four) times daily.  120 each  11  . lipase/protease/amylase (CREON-10/PANCREASE) 12000 UNITS CPEP Take 1 capsule by mouth 3 (three) times daily before meals.      Marland Kitchen lisinopril (PRINIVIL,ZESTRIL) 2.5 MG tablet Take 1 tablet (2.5 mg total) by mouth daily.  90 tablet  3  . Multiple Vitamins-Minerals (CENTRUM SILVER  PO) Take 1 tablet by mouth daily.       . Probiotic Product (PROBIOTIC DAILY PO) Take 1 tablet by mouth 5 (five) times daily.      Marland Kitchen SALINE NA Place 1 spray into both nostrils daily.      . tamsulosin (FLOMAX) 0.4 MG CAPS Take 1 capsule (0.4 mg total) by mouth 2 (two) times daily.  180 capsule  3   No current facility-administered medications on file prior to visit.        Review of Systems System review is negative for any constitutional, cardiac, pulmonary, GI or neuro symptoms or complaints other than as described in the HPI.     Objective:   Physical Exam Filed Vitals:   07/15/13 1519  BP: 108/60  Pulse: 74  Temp: 98.5 F (36.9 C)   Gen'l - Thin tall white man in no distress but still a bit weak. HEENT - C&S clear Cor - IRIR rate controlled Pulm - no increased WOB. Neuro - A&O x 3, able to ambutlate with the use of a rolling walker       Assessment & Plan:

## 2013-07-16 ENCOUNTER — Telehealth: Payer: Self-pay | Admitting: *Deleted

## 2013-07-16 NOTE — Telephone Encounter (Signed)
Ok for PT 

## 2013-07-16 NOTE — Telephone Encounter (Signed)
Danielle from [facility] called requesting orders for PT twice a week for 4 weeks to assist with strengthening.  Please advise

## 2013-07-17 ENCOUNTER — Telehealth: Payer: Self-pay | Admitting: *Deleted

## 2013-07-17 NOTE — Telephone Encounter (Signed)
Spoke with pts wife advised of MDs message. 

## 2013-07-17 NOTE — Telephone Encounter (Signed)
It is possible that there has been some bruise development related to the peritoneal hematoma which will go away with time.   The fact of a rising Hemoglobin argues against continued bleeding.   Happy to see him if needed.

## 2013-07-17 NOTE — Telephone Encounter (Signed)
Left detailed message on identified VM

## 2013-07-17 NOTE — Telephone Encounter (Signed)
Called states a large bruise has been noticed on pt lower left side and is concerning to [facility] RN and physical therapist.  16.5 in long from navel to middle of back and 5 in deep at highest peek.  Wife states it looks some better today but they wanted Dr Debby Bud to be aware.  [facility] RN believes it may be blood pooling due to pts admit diagnosis of internal bleeding.  Please advise

## 2013-07-24 ENCOUNTER — Telehealth: Payer: Self-pay

## 2013-07-24 NOTE — Telephone Encounter (Signed)
Phone call from Lynnell Catalan PT with at [facility] [phone] states patient has been having SOB with activity so she's requesting an order to monitor 02 sat during treatment. Please advise. Thanks

## 2013-07-24 NOTE — Telephone Encounter (Signed)
1. Ok for O2 sat monitoring 2. HAs there been any significant weight gain over the past several days? Looking for any signs of CHF

## 2013-07-24 NOTE — Telephone Encounter (Signed)
Phone call to [place] at [facility] letting her know okay for O2 sat monitoring. In the message I asked her to return my call letting me know if there have been any significant weight gain over the past several days. Will wait for her to call back.

## 2013-07-25 NOTE — Telephone Encounter (Signed)
Spoke to [place] and she states patient says he is feeling some what better. He weighted this morning and it is 188. He will keep track of his weight daily when he takes his blood pressure.

## 2013-08-05 ENCOUNTER — Ambulatory Visit (INDEPENDENT_AMBULATORY_CARE_PROVIDER_SITE_OTHER): Payer: Medicare Other | Admitting: Internal Medicine

## 2013-08-05 ENCOUNTER — Encounter: Payer: Self-pay | Admitting: Internal Medicine

## 2013-08-05 VITALS — BP 111/58 | HR 63 | Ht 75.0 in | Wt 190.2 lb

## 2013-08-05 DIAGNOSIS — I4891 Unspecified atrial fibrillation: Secondary | ICD-10-CM

## 2013-08-05 DIAGNOSIS — R0989 Other specified symptoms and signs involving the circulatory and respiratory systems: Secondary | ICD-10-CM

## 2013-08-05 LAB — CBC WITH DIFFERENTIAL/PLATELET
Basophils Absolute: 0 10*3/uL (ref 0.0–0.1)
Hemoglobin: 12 g/dL — ABNORMAL LOW (ref 13.0–17.0)
Lymphocytes Relative: 16.6 % (ref 12.0–46.0)
Monocytes Relative: 14.2 % — ABNORMAL HIGH (ref 3.0–12.0)
Platelets: 193 10*3/uL (ref 150.0–400.0)
RDW: 15.8 % — ABNORMAL HIGH (ref 11.5–14.6)
WBC: 6.2 10*3/uL (ref 4.5–10.5)

## 2013-08-05 LAB — TSH: TSH: 6.33 u[IU]/mL — ABNORMAL HIGH (ref 0.35–5.50)

## 2013-08-05 NOTE — Patient Instructions (Addendum)
Your physician recommends that you continue on your current medications as directed. Please refer to the Current Medication list given to you today.   Your physician recommends that you have lab work today: CBC  Your physician recommends that you schedule a follow-up appointment in: 10-12 weeks with Dr. Graciela Husbands

## 2013-08-05 NOTE — Progress Notes (Signed)
Patient Care Team: Jacques Navy, MD as PCP - General Duke Salvia, MD as Consulting Physician (Cardiology) Eulas Post, MD as Consulting Physician (Orthopedic Surgery)   HPI  [name] is a 79 y.o. male seen in followup for paroxysmal atrial fibrillation for which he  Took Tikosyn but now is on amiodarone and tolerating well without recurrent afib.  He has intercurrently been hospitalized with a life-threatening retroperitoneal bleed. He received 3 unit transfusion. Angiography failed to identify a source. Coumadin was discontinued. He remains on amiodarone.  He continues to feel weak.     Past Medical History  Diagnosis Date  . Acid reflux disease     history of  . H/O: GI bleed   . Paroxysmal atrial fibrillation     chronic anticoag; tikosyn  . Benign prostatic hypertrophy     history of  . Hepatic damage march 2009    retained hepatic stone , intrahepatic duct catheter  . Diabetes mellitus     insulin dep  . CAD (coronary artery disease)   . Hypertension   . 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 Seizures   . Ventricular tachycardia ? Tikosyn proarrhtyhmia     Past Surgical History  Procedure Laterality Date  . Cardioversion    . Cholecystectomy      history of  . Partial gastrectomy      history of  . Shoulder surgery      left shoulder repair with 2 pens inserted  . Whipple procedure      operation  . Sarcoma removal from rigth tricep    . Inguinal hernia repair      inguinal herniorrhaphy, right... history of  . Appendectomy      history of  . Cardioversion  03/07/2012    Procedure: CARDIOVERSION;  Surgeon: Duke Salvia, MD;  Location: [facility] OR;  Service: Cardiovascular;  Laterality: N/A;    Current Outpatient Prescriptions  Medication Sig Dispense Refill  . amiodarone (PACERONE) 200 MG tablet Take  1 tablet (200 mg total) by mouth as directed. Take 1 tablet by mouth five day a week.  90 tablet  3  . Ascorbic Acid (VITAMIN C) 500 MG tablet Take 500 mg by mouth daily.       . carvedilol (COREG) 3.125 MG tablet Take 1 tablet (3.125 mg total) by mouth 2 (two) times daily with a meal.  180 tablet  3  . CVS GLUCOSAMINE-CHONDROITIN PO Take 1 tablet by mouth daily. 1200/600 mg      . Emollient (AMLACTIN XL) LOTN Apply 1 application topically daily. Apply lotion to legs      . finasteride (PROSCAR) 5 MG tablet Take 1 tablet (5 mg total) by mouth daily.  90 tablet  3  . insulin aspart (NOVOLOG FLEXPEN) 100 UNIT/ML injection Inject 6 Units into the skin 3 (three) times daily before meals. Except 5 units before lunch.  6 pen  3  . insulin detemir (LEVEMIR FLEXPEN) 100 UNIT/ML injection Inject 10 Units into the skin at bedtime.  9 mL  3  . Insulin Pen Needle (COMFORT EZ PEN NEEDLES) 31G X 5 MM MISC 120 Devices by Does not apply route 4 (four) times daily.  120 each  11  . lipase/protease/amylase (CREON-10/PANCREASE) 12000 UNITS CPEP Take 1 capsule by mouth daily.       Marland Kitchen  lisinopril (PRINIVIL,ZESTRIL) 2.5 MG tablet Take 1 tablet (2.5 mg total) by mouth daily.  90 tablet  3  . Multiple Vitamins-Minerals (CENTRUM SILVER PO) Take 1 tablet by mouth daily.       . Probiotic Product (PROBIOTIC DAILY PO) Take 1 tablet by mouth daily. 3 tabs twice a day with meals.      Marland Kitchen SALINE NA Place 1 spray into both nostrils daily.      . tamsulosin (FLOMAX) 0.4 MG CAPS Take 1 capsule (0.4 mg total) by mouth 2 (two) times daily.  180 capsule  3   No current facility-administered medications for this visit.    Allergies  Allergen Reactions  . Clindamycin Other (See Comments)    Reaction unknown  . Oxycodone-Acetaminophen Other (See Comments)    Reaction unknown  . Penicillins     REACTION: causes hives    Review of Systems negative except from HPI and PMH  Physical Exam BP 111/58  Pulse 63  Ht 6\' 3"  (1.905 m)   Wt 190 lb 3.2 oz (86.274 kg)  BMI 23.77 kg/m2 Well developed and well nourished in no acute distress HENT normal E scleral and icterus clear Neck Supple JVP flat; carotids brisk and full Clear to ausculation  Regular rate and rhythm, no murmurs gallops or rub Soft with active bowel sounds No clubbing cyanosis none Edema Alert and oriented, grossly normal motor and sensory function Skin Warm and Dry but pale  ECG demonstrates sinus rhythm at 63 Interval 21/10/42 Access left -38 Assessment and  Plan  Paroxysmal atrial fibrillation  patient's Coumadin was discontinued because of a retroperitoneal bleed. CHADS-2   score is 3 and as such it would be appropriate to reconsider the initiation of anticoagulation following some interval. We have reviewed the data regarding the lack of benefit of aspirin and I would not use it. I have reviewed the data regarding apixaban in the AVVEROES trial    Polymorphic ventricular tachycardia no recurrences  Anemia secondary to retroperitoneal bleed I think it is as intense as opposed to issues of ongoing risk. Hence I think it is reasonable to consider reinitiation of anticoagulation some time interval. We will plan to review this 10-12 weeks  Coronary artery disease stable on current medications

## 2013-08-08 DIAGNOSIS — E119 Type 2 diabetes mellitus without complications: Secondary | ICD-10-CM

## 2013-08-08 DIAGNOSIS — R58 Hemorrhage, not elsewhere classified: Secondary | ICD-10-CM

## 2013-08-27 ENCOUNTER — Ambulatory Visit: Payer: Medicare Other | Admitting: Internal Medicine

## 2013-09-20 ENCOUNTER — Telehealth: Payer: Self-pay

## 2013-09-20 NOTE — Telephone Encounter (Signed)
Phone call from patient stating he is due for a follow up CT scan. Please advise. I let him know it will be Monday before you get the message.

## 2013-09-23 ENCOUNTER — Telehealth: Payer: Self-pay

## 2013-09-23 DIAGNOSIS — S37812A Contusion of adrenal gland, initial encounter: Secondary | ICD-10-CM | POA: Insufficient documentation

## 2013-09-23 DIAGNOSIS — D3502 Benign neoplasm of left adrenal gland: Secondary | ICD-10-CM

## 2013-09-23 NOTE — Telephone Encounter (Signed)
Patient states he had a growth on his adrenal gland that's why a CT scan f/u is needed. So he does not need?

## 2013-09-23 NOTE — Telephone Encounter (Signed)
Reviewed hospital d/c summary and post-hospital visit and last labs from September.  Not sure what CT scan follow up he is referring to. There is no need for a CT follow up of retroperitoneal hemorrhage if he is feeling ok. It would be reasonable to get a follow up Hgb level if there is any question about continued bleeding.

## 2013-09-23 NOTE — Telephone Encounter (Signed)
Patient notified on an identified voicemail of message below.

## 2013-09-23 NOTE — Telephone Encounter (Signed)
Patient aware CT ordered and states it has already been scheduled.

## 2013-09-23 NOTE — Telephone Encounter (Signed)
Got it. Reviewed CT in detail: there was an increase in the adrenal mass suggesting an enlarging adrenal adenoma.  CT ordered.

## 2013-09-26 ENCOUNTER — Other Ambulatory Visit: Payer: Self-pay | Admitting: Internal Medicine

## 2013-09-26 DIAGNOSIS — S37812S Contusion of adrenal gland, sequela: Secondary | ICD-10-CM

## 2013-09-27 ENCOUNTER — Ambulatory Visit (INDEPENDENT_AMBULATORY_CARE_PROVIDER_SITE_OTHER)
Admission: RE | Admit: 2013-09-27 | Discharge: 2013-09-27 | Disposition: A | Discharge status: Home / Self Care | Payer: Medicare Other | Source: Ambulatory Visit | Attending: Internal Medicine | Admitting: Internal Medicine

## 2013-09-27 ENCOUNTER — Inpatient Hospital Stay: Admission: RE | Admit: 2013-09-27 | Payer: Medicare Other | Source: Ambulatory Visit

## 2013-09-27 DIAGNOSIS — S37812S Contusion of adrenal gland, sequela: Secondary | ICD-10-CM

## 2013-09-27 DIAGNOSIS — IMO0002 Reserved for concepts with insufficient information to code with codable children: Secondary | ICD-10-CM

## 2013-10-07 ENCOUNTER — Telehealth: Payer: Self-pay | Admitting: *Deleted

## 2013-10-07 NOTE — Telephone Encounter (Signed)
Pt called requesting Ct results. Please advise.

## 2013-10-08 NOTE — Telephone Encounter (Signed)
Spoke with pt advised MDs result note

## 2013-10-08 NOTE — Telephone Encounter (Signed)
Hematoma getting smaller. No change in left adrenal nodule. MyChart msg with results sent 09/27/13; msg read 10/02/13.

## 2013-10-23 ENCOUNTER — Telehealth: Payer: Self-pay

## 2013-10-23 NOTE — Telephone Encounter (Signed)
Phone call to patient letting him know he is due for his Prevnar vaccine. He was transferred to scheduling to make this appt.

## 2013-11-05 ENCOUNTER — Ambulatory Visit (INDEPENDENT_AMBULATORY_CARE_PROVIDER_SITE_OTHER): Payer: Medicare Other | Admitting: Internal Medicine

## 2013-11-05 ENCOUNTER — Encounter: Payer: Self-pay | Admitting: Internal Medicine

## 2013-11-05 VITALS — BP 123/61 | HR 63 | Ht 74.0 in | Wt 192.4 lb

## 2013-11-05 DIAGNOSIS — I4891 Unspecified atrial fibrillation: Secondary | ICD-10-CM

## 2013-11-05 LAB — HEPATIC FUNCTION PANEL
Bilirubin, Direct: 0.1 mg/dL (ref 0.0–0.3)
Total Bilirubin: 0.6 mg/dL (ref 0.3–1.2)

## 2013-11-05 MED ORDER — APIXABAN 2.5 MG PO TABS: 2.5000 mg | ORAL_TABLET | Freq: Two times a day (BID) | ORAL | Status: DC

## 2013-11-05 NOTE — Assessment & Plan Note (Signed)
No known recurrent VT

## 2013-11-05 NOTE — Assessment & Plan Note (Signed)
No recurrent atrial fibrillation. We have discussed anticoagulation. We try to balance risks and benefits and so we will begin him on apixaban 2.5 mg twice daily. They're aware of the uncertainty inherent in this recommendation but would like to proceed this way

## 2013-11-05 NOTE — Progress Notes (Signed)
Patient Care Team: Jacques Navy, MD as PCP - General Duke Salvia, MD as Consulting Physician (Cardiology) Eulas Post, MD as Consulting Physician (Orthopedic Surgery)   HPI  [name] is a 79 y.o. male seen in followup for paroxysmal atrial fibrillation for which he  Took Tikosyn but now is on amiodarone and tolerating well without recurrent afib.  He has intercurrently been hospitalized with a life-threatening retroperitoneal bleed. He received 3 unit transfusion. Angiography failed to identify a source. Coumadin was discontinued. He remains on amiodarone.  He is feeling stronger  Thromboembolic risk profile includes age,  hypertension, diabetes, for a CHADS-2 score of 3 and a CHADS-VASc score of 5   Past Medical History  Diagnosis Date  . Acid reflux disease     history of  . H/O: GI bleed   . Paroxysmal atrial fibrillation     chronic anticoag; tikosyn  . Benign prostatic hypertrophy     history of  . Hepatic damage march 2009    retained hepatic stone , intrahepatic duct catheter  . Diabetes mellitus     insulin dep  . CAD (coronary artery disease)   . Hypertension   . 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 Seizures   . Ventricular tachycardia ? Tikosyn proarrhtyhmia     Past Surgical History  Procedure Laterality Date  . Cardioversion    . Cholecystectomy      history of  . Partial gastrectomy      history of  . Shoulder surgery      left shoulder repair with 2 pens inserted  . Whipple procedure      operation  . Sarcoma removal from rigth tricep    . Inguinal hernia repair      inguinal herniorrhaphy, right... history of  . Appendectomy      history of  . Cardioversion  03/07/2012    Procedure: CARDIOVERSION;  Surgeon: Duke Salvia, MD;  Location: [facility] OR;  Service: Cardiovascular;   Laterality: N/A;    Current Outpatient Prescriptions  Medication Sig Dispense Refill  . amiodarone (PACERONE) 200 MG tablet Take 1 tablet (200 mg total) by mouth as directed. Take 1 tablet by mouth five day a week.  90 tablet  3  . Ascorbic Acid (VITAMIN C) 500 MG tablet Take 500 mg by mouth daily.       . carvedilol (COREG) 3.125 MG tablet Take 1 tablet (3.125 mg total) by mouth 2 (two) times daily with a meal.  180 tablet  3  . [facility]S GLUCOSAMINE-CHONDROITIN PO Take 1 tablet by mouth daily. 1200/600 mg      . Emollient (AMLACTIN XL) LOTN Apply 1 application topically daily. Apply lotion to legs      . finasteride (PROSCAR) 5 MG tablet Take 1 tablet (5 mg total) by mouth daily.  90 tablet  3  . insulin aspart (NOVOLOG FLEXPEN) 100 UNIT/ML injection Inject 6 Units into the skin 3 (three) times daily before meals. Except 5 units before lunch.  6 pen  3  . insulin detemir (LEVEMIR FLEXPEN) 100 UNIT/ML injection Inject 10 Units into the skin at bedtime.  9 mL  3  . Insulin Pen Needle (COMFORT EZ PEN NEEDLES) 31G X 5 MM MISC 120 Devices by Does not apply route 4 (  four) times daily.  120 each  11  . lipase/protease/amylase (CREON-10/PANCREASE) 12000 UNITS CPEP Take 1 capsule by mouth daily.       Marland Kitchen lisinopril (PRINIVIL,ZESTRIL) 2.5 MG tablet Take 1 tablet (2.5 mg total) by mouth daily.  90 tablet  3  . Multiple Vitamins-Minerals (CENTRUM SILVER PO) Take 1 tablet by mouth daily.       . Probiotic Product (PROBIOTIC DAILY PO) Take 1 tablet by mouth daily. 3 tabs twice a day with meals.      Marland Kitchen SALINE NA Place 1 spray into both nostrils daily.      . tamsulosin (FLOMAX) 0.4 MG CAPS Take 1 capsule (0.4 mg total) by mouth 2 (two) times daily.  180 capsule  3   No current facility-administered medications for this visit.    Allergies  Allergen Reactions  . Clindamycin Other (See Comments)    Reaction unknown  . Oxycodone-Acetaminophen Other (See Comments)    Reaction unknown  . Penicillins      REACTION: causes hives    Review of Systems negative except from HPI and PMH  Physical Exam BP 123/61  Pulse 63  Ht 6\' 2"  (1.88 m)  Wt 192 lb 6.4 oz (87.272 kg)  BMI 24.69 kg/m2 Well developed and well nourished in no acute distress HENT normal E scleral and icterus clear Neck Supple JVP flat; carotids brisk and full Clear to ausculation  *Regular rate and rhythm, widely split S1 and S2 and an early systolic murmur Soft with active bowel sounds No clubbing cyanosis none and Edema Alert and oriented, grossly normal motor and sensory function Skin Warm and Dry  ECG demonstrates sinus rhythm at 63 intervals 19/09/44  Assessment and  Plan

## 2013-11-05 NOTE — Patient Instructions (Addendum)
Your physician has recommended you make the following change in your medication:  1) Start Eliquis 2.5 mg twice daily  Your physician recommends that you return for lab work today: TSH/LFT  Your physician recommends that you schedule a follow-up appointment in: 3 months with Dr. Graciela Husbands.

## 2013-11-06 ENCOUNTER — Telehealth: Payer: Self-pay

## 2013-11-06 ENCOUNTER — Telehealth: Payer: Self-pay | Admitting: Internal Medicine

## 2013-11-06 NOTE — Telephone Encounter (Signed)
New Message  Pt was given a card for a 1 month supply for Eliquis but the card given was for a $10 co- pay// please call back to clarify

## 2013-11-06 NOTE — Telephone Encounter (Signed)
[  pharmacy called to let us know that they sent over a  Pa

## 2013-11-07 ENCOUNTER — Other Ambulatory Visit: Payer: Self-pay | Admitting: *Deleted

## 2013-11-07 DIAGNOSIS — I4891 Unspecified atrial fibrillation: Secondary | ICD-10-CM

## 2013-11-08 ENCOUNTER — Telehealth: Payer: Self-pay | Admitting: *Deleted

## 2013-11-08 NOTE — Telephone Encounter (Signed)
Left message for pt explaining to call our refill team for information about Eliquis and card.

## 2013-11-08 NOTE — Telephone Encounter (Signed)
[employer] rx approval for eliquis until 11/06/2014 PA # [id number]

## 2013-12-02 ENCOUNTER — Encounter: Payer: Self-pay | Admitting: Internal Medicine

## 2013-12-02 ENCOUNTER — Ambulatory Visit (INDEPENDENT_AMBULATORY_CARE_PROVIDER_SITE_OTHER): Payer: Medicare Other | Admitting: Internal Medicine

## 2013-12-02 ENCOUNTER — Other Ambulatory Visit (INDEPENDENT_AMBULATORY_CARE_PROVIDER_SITE_OTHER): Payer: Medicare Other

## 2013-12-02 VITALS — BP 118/56 | HR 64 | Temp 97.5°F | Wt 186.4 lb

## 2013-12-02 DIAGNOSIS — R6889 Other general symptoms and signs: Secondary | ICD-10-CM

## 2013-12-02 DIAGNOSIS — R7989 Other specified abnormal findings of blood chemistry: Secondary | ICD-10-CM

## 2013-12-02 DIAGNOSIS — I4891 Unspecified atrial fibrillation: Secondary | ICD-10-CM

## 2013-12-02 DIAGNOSIS — E119 Type 2 diabetes mellitus without complications: Secondary | ICD-10-CM

## 2013-12-02 DIAGNOSIS — Z5189 Encounter for other specified aftercare: Secondary | ICD-10-CM

## 2013-12-02 DIAGNOSIS — Z23 Encounter for immunization: Secondary | ICD-10-CM

## 2013-12-02 DIAGNOSIS — S37812D Contusion of adrenal gland, subsequent encounter: Secondary | ICD-10-CM

## 2013-12-02 LAB — LIPID PANEL
CHOL/HDL RATIO: 3
Cholesterol: 142 mg/dL (ref 0–200)
HDL: 45.2 mg/dL (ref >39.00)
LDL Cholesterol: 83 mg/dL (ref 0–99)
Triglycerides: 67 mg/dL (ref 0.0–149.0)
VLDL: 13.4 mg/dL (ref 0.0–40.0)

## 2013-12-02 LAB — HEPATIC FUNCTION PANEL
ALK PHOS: 132 U/L — AB (ref 39–117)
ALT: 30 U/L (ref 0–53)
AST: 35 U/L (ref 0–37)
Albumin: 4 g/dL (ref 3.5–5.2)
Bilirubin, Direct: 0.1 mg/dL (ref 0.0–0.3)
Total Bilirubin: 0.6 mg/dL (ref 0.3–1.2)
Total Protein: 7.5 g/dL (ref 6.0–8.3)

## 2013-12-02 LAB — HEMOGLOBIN A1C: HEMOGLOBIN A1C: 6.6 % — AB (ref 4.6–6.5)

## 2013-12-02 LAB — TSH: TSH: 7.9 u[IU]/mL — AB (ref 0.35–5.50)

## 2013-12-02 MED ORDER — INSULIN ASPART 100 UNIT/ML ~~LOC~~ SOLN: 6.0000 [IU] | Freq: Three times a day (TID) | SUBCUTANEOUS | Status: DC

## 2013-12-02 MED ORDER — INSULIN DETEMIR 100 UNIT/ML ~~LOC~~ SOLN: 10.0000 [IU] | Freq: Every day | SUBCUTANEOUS | Status: DC

## 2013-12-02 MED ORDER — INSULIN PEN NEEDLE 31G X 5 MM MISC: 120.0000 | Freq: Four times a day (QID) | Status: DC

## 2013-12-02 NOTE — Patient Instructions (Signed)
Good to see you.  IN regard to blood thinners and stroke reduction - I understand your reservations. Low dose aspirin is better than nothing - 81 mg daily  Diabetes  - log looks good. For A1C today with recommendations to follow.  Cholesterol - routine lab today including liver functions.  Immunizations - Prevnar pneumonia vaccine - companion to the pneumovax you have already had.

## 2013-12-02 NOTE — Progress Notes (Signed)
Subjective:    Patient ID: [name], male    DOB: 08/08/1925, 80 y.o.   MRN: [id number]  HPI Mr. Wallner presents medical follow up on DM and other matters. Overall he is feeling well: at [employer], exercising daily.  DM log reviewed - pretty good control. Last A1C 6.3 %. due for lab  Atrial fibrillation and risk of stroke: reviewed the role of anti-coagulants and risk reduction. He does have concerns about prior bleeding episodes. Discussed that low dose aspirin is recommended in general and is better than nothing.  Past Medical History  Diagnosis Date  . Acid reflux disease     history of  . H/O: GI bleed   . Paroxysmal atrial fibrillation     chronic anticoag; tikosyn  . Benign prostatic hypertrophy     history of  . Hepatic damage march 2009    retained hepatic stone , intrahepatic duct catheter  . Diabetes mellitus     insulin dep  . CAD (coronary artery disease)   . Hypertension   . 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 Seizures   . Ventricular tachycardia ? Tikosyn proarrhtyhmia    Past Surgical History  Procedure Laterality Date  . Cardioversion    . Cholecystectomy      history of  . Partial gastrectomy      history of  . Shoulder surgery      left shoulder repair with 2 pens inserted  . Whipple procedure      operation  . Sarcoma removal from rigth tricep    . Inguinal hernia repair      inguinal herniorrhaphy, right... history of  . Appendectomy      history of  . Cardioversion  03/07/2012    Procedure: CARDIOVERSION;  Surgeon: Deboraha Sprang, MD;  Location: [facility] OR;  Service: Cardiovascular;  Laterality: N/A;   Family History  Problem Relation Age of Onset  . Coronary artery disease Father 44  . Heart disease Father     fatal MI  . Osteoarthritis Mother 5  . Arthritis Mother   . Coronary artery  disease Brother     cabg, avr, pvd with sents  . Heart disease Brother     CABG, PVD  . Peripheral vascular disease Sister 13  . Fibromyalgia Sister   . Arthritis Sister   . Coronary artery disease Sister   . Diabetes Sister   . Cancer Brother    History   Social History  . Marital Status: Married    Spouse Name: N/A    Number of Children: 3  . Years of Education: N/A   Occupational History  . 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     Social History Main Topics  . Smoking status: Former Smoker    Types: Pipe    Quit date: 03/14/1991  . Smokeless tobacco: Never Used  . Alcohol Use: No  . Drug Use: No  . Sexual Activity: Not Currently   Other Topics Concern  . Not on file   Social History Narrative   [employer] -IT sales professional. married 1954. 2 sons - [place]; 1 daughter 38. 7 grandchildren. retired- Training and development officer.  End of Life Care: DNR, no prolonged intubation (more than 2-3 days), no sustaining tube feeding, no futile  or heroic measures. (Provided signed Out of Facility order and unsigned MOST form 6/112/12)    Current Outpatient Prescriptions on File Prior to Visit  Medication Sig Dispense Refill  . amiodarone (PACERONE) 200 MG tablet Take 1 tablet (200 mg total) by mouth as directed. Take 1 tablet by mouth five day a week.  90 tablet  3  . apixaban (ELIQUIS) 2.5 MG TABS tablet Take 1 tablet (2.5 mg total) by mouth 2 (two) times daily.  60 tablet  0  . Ascorbic Acid (VITAMIN C) 500 MG tablet Take 500 mg by mouth daily.       . carvedilol (COREG) 3.125 MG tablet Take 1 tablet (3.125 mg total) by mouth 2 (two) times daily with a meal.  180 tablet  3  . CVS GLUCOSAMINE-CHONDROITIN PO Take 1 tablet by mouth daily. 1200/600 mg      . Emollient (AMLACTIN XL) LOTN Apply 1 application topically daily. Apply lotion to legs      . finasteride (PROSCAR) 5 MG tablet Take 1 tablet (5 mg total) by mouth daily.  90 tablet  3  . insulin aspart (NOVOLOG FLEXPEN) 100  UNIT/ML injection Inject 6 Units into the skin 3 (three) times daily before meals. Except 5 units before lunch.  6 pen  3  . insulin detemir (LEVEMIR FLEXPEN) 100 UNIT/ML injection Inject 10 Units into the skin at bedtime.  9 mL  3  . Insulin Pen Needle (COMFORT EZ PEN NEEDLES) 31G X 5 MM MISC 120 Devices by Does not apply route 4 (four) times daily.  120 each  11  . lipase/protease/amylase (CREON-10/PANCREASE) 12000 UNITS CPEP Take 1 capsule by mouth daily.       Marland Kitchen lisinopril (PRINIVIL,ZESTRIL) 2.5 MG tablet Take 1 tablet (2.5 mg total) by mouth daily.  90 tablet  3  . Multiple Vitamins-Minerals (CENTRUM SILVER PO) Take 1 tablet by mouth daily.       . Probiotic Product (PROBIOTIC DAILY PO) Take 1 tablet by mouth daily. 3 tabs twice a day with meals.      Marland Kitchen SALINE NA Place 1 spray into both nostrils daily.      . tamsulosin (FLOMAX) 0.4 MG CAPS Take 1 capsule (0.4 mg total) by mouth 2 (two) times daily.  180 capsule  3   No current facility-administered medications on file prior to visit.      Review of Systems System review is negative for any constitutional, cardiac, pulmonary, GI or neuro symptoms or complaints other than as described in the HPI.     Objective:   Physical Exam Filed Vitals:   12/02/13 1026  BP: 118/56  Pulse: 64  Temp: 97.5 F (36.4 C)   Wt Readings from Last 3 Encounters:  12/02/13 186 lb 6.4 oz (84.55 kg)  11/05/13 192 lb 6.4 oz (87.272 kg)  08/05/13 190 lb 3.2 oz (86.274 kg)   Gen'l - WNWD man in no distress HEENT - normal Cor - 2+ radial, RRR Pulm - CTAP Neuro - alert and oriented. See DM foot.       Assessment & Plan:

## 2013-12-02 NOTE — Progress Notes (Signed)
Pre visit review using our clinic review tool, if applicable. No additional management support is needed unless otherwise documented below in the visit note. 

## 2013-12-03 NOTE — Assessment & Plan Note (Signed)
Holding stable rhythm. Discussed stroke risk and anti-coagulation to reduce risk. He has many concerns given spontaneous retroperitoneal bleed in August '14.  Plan Discuss use of NOAC drugs further with Dr. Caryl Comes  Consider using low dose ASA for now

## 2013-12-03 NOTE — Assessment & Plan Note (Signed)
Asymptomatic adrenal lesion, (?) adrenal hematoma.  Plan F/u limited CT August '15

## 2013-12-03 NOTE — Assessment & Plan Note (Signed)
Lab Results  Component Value Date   HGBA1C 6.6* 12/02/2013   Continued good control on present regimen

## 2013-12-20 ENCOUNTER — Other Ambulatory Visit: Payer: Self-pay | Admitting: Dermatology

## 2013-12-25 ENCOUNTER — Telehealth: Payer: Self-pay | Admitting: Internal Medicine

## 2013-12-25 NOTE — Telephone Encounter (Signed)
New Problem:  Pt is asking if he still needs to keep his lab appt on 12/30/13 or if it can be canceled... Pt had lab at [facility] on 12/02/13... Pt would like a call back from the nurse.

## 2013-12-25 NOTE — Telephone Encounter (Signed)
No instructions/orders from Dr. Caryl Comes

## 2013-12-25 NOTE — Telephone Encounter (Signed)
Informed pt that he did not need to come in for lab work, as he had [facility] lab drawn on 1/5. I will review results with Dr. Caryl Comes and call pt if there are any new instructions. Pt agreeable to plan.

## 2013-12-30 ENCOUNTER — Other Ambulatory Visit: Payer: Medicare Other

## 2014-01-21 ENCOUNTER — Encounter: Payer: Self-pay | Admitting: Internal Medicine

## 2014-02-11 ENCOUNTER — Ambulatory Visit (INDEPENDENT_AMBULATORY_CARE_PROVIDER_SITE_OTHER): Payer: Medicare Other | Admitting: Internal Medicine

## 2014-02-11 ENCOUNTER — Encounter: Payer: Self-pay | Admitting: Internal Medicine

## 2014-02-11 VITALS — BP 116/53 | HR 61 | Ht 74.0 in | Wt 194.0 lb

## 2014-02-11 DIAGNOSIS — I480 Paroxysmal atrial fibrillation: Secondary | ICD-10-CM

## 2014-02-11 DIAGNOSIS — I4891 Unspecified atrial fibrillation: Secondary | ICD-10-CM

## 2014-02-11 MED ORDER — CARVEDILOL 3.125 MG PO TABS: 3.1250 mg | ORAL_TABLET | Freq: Two times a day (BID) | ORAL | Status: DC

## 2014-02-11 MED ORDER — LISINOPRIL 2.5 MG PO TABS: 2.5000 mg | ORAL_TABLET | Freq: Every day | ORAL | Status: DC

## 2014-02-11 MED ORDER — AMIODARONE HCL 200 MG PO TABS: 200.0000 mg | ORAL_TABLET | ORAL | Status: DC

## 2014-02-11 NOTE — Progress Notes (Signed)
Patient Care Team: Neena Rhymes, MD as PCP - General Deboraha Sprang, MD as Consulting Physician (Cardiology) Johnny Bridge, MD as Consulting Physician (Orthopedic Surgery)   HPI  [name] is a 80 y.o. male seen in followup for paroxysmal atrial fibrillation for which he  Took Tikosyn but now is on amiodarone and tolerating well without recurrent afib.   He has intercurrently been hospitalized with a life-threatening retroperitoneal bleed. He received 3 unit transfusion. Angiography failed to identify a source. Coumadin was discontinued. He remains on amiodarone.  He is feeling stronger   Thromboembolic risk profile includes age, hypertension, diabetes, for a CHADS-2 score of 3 and a CHADS-VASc score of 5  at the last visit we elected to start him on apixaban. He comes in today not having had a prescription filled for a variety of reasons not least of which is his ambivalence.      Past Medical History  Diagnosis Date  . Acid reflux disease     history of  . H/O: GI bleed   . Paroxysmal atrial fibrillation     chronic anticoag; tikosyn  . Benign prostatic hypertrophy     history of  . Hepatic damage march 2009    retained hepatic stone , intrahepatic duct catheter  . Diabetes mellitus     insulin dep  . CAD (coronary artery disease)   . Hypertension   . 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 Seizures   . Ventricular tachycardia ? Tikosyn proarrhtyhmia     Past Surgical History  Procedure Laterality Date  . Cardioversion    . Cholecystectomy      history of  . Partial gastrectomy      history of  . Shoulder surgery      left shoulder repair with 2 pens inserted  . Whipple procedure      operation  . Sarcoma removal from rigth tricep    . Inguinal hernia repair      inguinal herniorrhaphy, right...  history of  . Appendectomy      history of  . Cardioversion  03/07/2012    Procedure: CARDIOVERSION;  Surgeon: Deboraha Sprang, MD;  Location: [facility] OR;  Service: Cardiovascular;  Laterality: N/A;    Current Outpatient Prescriptions  Medication Sig Dispense Refill  . amiodarone (PACERONE) 200 MG tablet Take 1 tablet (200 mg total) by mouth as directed. Take 1 tablet by mouth five day a week.  90 tablet  3  . Ascorbic Acid (VITAMIN C) 500 MG tablet Take 500 mg by mouth daily.       . carvedilol (COREG) 3.125 MG tablet 3 (three) times daily.      . CVS GLUCOSAMINE-CHONDROITIN PO Take 1 tablet by mouth daily. 1200/600 mg      . Emollient (AMLACTIN XL) LOTN Apply 1 application topically daily. Apply lotion to legs      . finasteride (PROSCAR) 5 MG tablet Take 1 tablet (5 mg total) by mouth daily.  90 tablet  3  . insulin aspart (NOVOLOG FLEXPEN) 100 UNIT/ML injection Inject 6 Units into the skin 3 (three) times daily before meals. Except 5 units before lunch.  30 mL  3  . insulin detemir (LEVEMIR) 100 UNIT/ML injection Inject 0.1 mLs (10 Units total) into the skin at  bedtime.  9 mL  3  . Insulin Pen Needle (COMFORT EZ PEN NEEDLES) 31G X 5 MM MISC 120 Devices by Does not apply route 4 (four) times daily.  120 each  3  . lipase/protease/amylase (CREON-10/PANCREASE) 12000 UNITS CPEP Take 1 capsule by mouth daily.       Marland Kitchen lisinopril (PRINIVIL,ZESTRIL) 2.5 MG tablet daily.      . Multiple Vitamins-Minerals (CENTRUM SILVER PO) Take 1 tablet by mouth daily.       . Probiotic Product (PROBIOTIC DAILY PO) Take 1 tablet by mouth daily. 3 tabs twice a day with meals.      Marland Kitchen SALINE NA Place 1 spray into both nostrils daily.      . tamsulosin (FLOMAX) 0.4 MG CAPS Take 1 capsule (0.4 mg total) by mouth 2 (two) times daily.  180 capsule  3  . apixaban (ELIQUIS) 2.5 MG TABS tablet Take 1 tablet (2.5 mg total) by mouth 2 (two) times daily.  60 tablet  0   No current facility-administered medications for this visit.      Allergies  Allergen Reactions  . Clindamycin Other (See Comments)    Reaction unknown  . Oxycodone-Acetaminophen Other (See Comments)    Reaction unknown  . Penicillins     REACTION: causes hives    Review of Systems negative except from HPI and PMH  Physical Exam BP 116/53  Pulse 61  Ht 6\' 2"  (1.88 m)  Wt 194 lb (87.998 kg)  BMI 24.90 kg/m2 Well developed and nourished in no acute distress HENT normal Neck supple with JVP-flat Clear Regular rate and rhythm, no murmurs or gallops Abd-soft with active BS No Clubbing cyanosis edema Skin-warm and dry A & Oriented  Grossly normal sensory and motor function   ECG demonstrates sinus rhythm at 61 intervals 21/09/44 Prior septal infarct Axis -21 No changes since 2012    Assessment and  Plan  Atrial fibrillation on amiodarone  History of retroperitoneal bleed  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 We discussed extensively the issues related to anticoagulation the relative risks of aspirin and apixaban based on the Averroes trial.  We will not use anticoagulation. We'll continue on amiodarone. It is borderline elevated TSH Dr. Linda Hedges was started on low-dose Synthroid; this is not unreasonable.

## 2014-02-11 NOTE — Patient Instructions (Addendum)
Your physician recommends that you continue on your current medications as directed. Please refer to the Current Medication list given to you today.  Your physician wants you to follow-up in: 6 months with Dr. Klein. You will receive a reminder letter in the mail two months in advance. If you don't receive a letter, please call our office to schedule the follow-up appointment.  

## 2014-03-06 ENCOUNTER — Other Ambulatory Visit: Payer: Self-pay

## 2014-03-19 ENCOUNTER — Ambulatory Visit (INDEPENDENT_AMBULATORY_CARE_PROVIDER_SITE_OTHER): Payer: Medicare Other | Admitting: Internal Medicine

## 2014-03-19 ENCOUNTER — Other Ambulatory Visit (INDEPENDENT_AMBULATORY_CARE_PROVIDER_SITE_OTHER): Payer: Medicare Other

## 2014-03-19 ENCOUNTER — Encounter: Payer: Self-pay | Admitting: Internal Medicine

## 2014-03-19 VITALS — BP 130/62 | HR 71 | Temp 97.3°F | Resp 12 | Wt 193.1 lb

## 2014-03-19 DIAGNOSIS — I4891 Unspecified atrial fibrillation: Secondary | ICD-10-CM

## 2014-03-19 DIAGNOSIS — E119 Type 2 diabetes mellitus without complications: Secondary | ICD-10-CM

## 2014-03-19 DIAGNOSIS — R946 Abnormal results of thyroid function studies: Secondary | ICD-10-CM

## 2014-03-19 DIAGNOSIS — I2510 Atherosclerotic heart disease of native coronary artery without angina pectoris: Secondary | ICD-10-CM

## 2014-03-19 DIAGNOSIS — R7989 Other specified abnormal findings of blood chemistry: Secondary | ICD-10-CM

## 2014-03-19 LAB — CBC WITH DIFFERENTIAL/PLATELET
BASOS PCT: 0.6 % (ref 0.0–3.0)
Basophils Absolute: 0 10*3/uL (ref 0.0–0.1)
EOS PCT: 9.8 % — AB (ref 0.0–5.0)
Eosinophils Absolute: 0.6 10*3/uL (ref 0.0–0.7)
HCT: 37.6 % — ABNORMAL LOW (ref 39.0–52.0)
Hemoglobin: 12.6 g/dL — ABNORMAL LOW (ref 13.0–17.0)
LYMPHS ABS: 1.2 10*3/uL (ref 0.7–4.0)
Lymphocytes Relative: 20.4 % (ref 12.0–46.0)
MCHC: 33.4 g/dL (ref 30.0–36.0)
MCV: 99 fl (ref 78.0–100.0)
MONO ABS: 0.7 10*3/uL (ref 0.1–1.0)
MONOS PCT: 11.8 % (ref 3.0–12.0)
Neutro Abs: 3.5 10*3/uL (ref 1.4–7.7)
Neutrophils Relative %: 57.4 % (ref 43.0–77.0)
PLATELETS: 162 10*3/uL (ref 150.0–400.0)
RBC: 3.8 Mil/uL — AB (ref 4.22–5.81)
RDW: 13.9 % (ref 11.5–14.6)
WBC: 6 10*3/uL (ref 4.5–10.5)

## 2014-03-19 LAB — MICROALBUMIN / CREATININE URINE RATIO
Creatinine,U: 83.3 mg/dL
MICROALB UR: 0.7 mg/dL (ref 0.0–1.9)
MICROALB/CREAT RATIO: 0.8 mg/g (ref 0.0–30.0)

## 2014-03-19 LAB — HEMOGLOBIN A1C: Hgb A1c MFr Bld: 6.4 % (ref 4.6–6.5)

## 2014-03-19 NOTE — Patient Instructions (Addendum)
OK to stop the levemir  OK to start the Toujeo at 14 units per day  OK to decrease the Novolog to 4 units with each meal ( or less if you have consistent low sugars, or NONE for example at lunch if you are going to go for a walk in the afternoon)  Please start the Eliquis at 2.5 mg twice per day (by the prescription that you already have)  Please go to the LAB in the Basement (turn left off the elevator) for the tests to be done today  You will be contacted by phone if any changes need to be made immediately.  Otherwise, you will receive a letter about your results with an explanation, but please check with MyChart first.  Please remember to sign up for MyChart if you have not done so, as this will be important to you in the future with finding out test results, communicating by private email, and scheduling acute appointments online when needed.  Please return in 3 months, or sooner if needed  You will be contacted regarding the referral for: Podiatry for the feet

## 2014-03-19 NOTE — Progress Notes (Signed)
Subjective:    Patient ID: [name], male    DOB: [birth year], 80 y.o.   MRN: [id number]  HPI  Here as he got notices from [facility], to establish with new PCP. overall doing ok,  Pt denies chest pain, increased sob or doe, wheezing, orthopnea, PND, increased LE swelling, palpitations, dizziness or syncope.  Pt denies polydipsia, polyuria, but has had several low sugar symptoms such as weakness or confusion improved with po intake on current meds.  Pt denies new neurological symptoms such as new headache, or facial or extremity weakness or numbness.   Pt states overall good compliance with meds, has been trying to follow lower cholesterol, diabetic diet, with wt overall stable.  Has not yet started eliquis but is considering it, has rx at home.  Has recent elev TSH- Denies hyper or hypo thyroid symptoms such as voice, skin or hair change. On amiodarone Past Medical History  Diagnosis Date  . Acid reflux disease     history of  . H/O: GI bleed   . Paroxysmal atrial fibrillation     chronic anticoag; tikosyn  . Benign prostatic hypertrophy     history of  . Hepatic damage march 2009    retained hepatic stone , intrahepatic duct catheter  . Diabetes mellitus     insulin dep  . CAD (coronary artery disease)   . Hypertension   . 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 Seizures   . Ventricular tachycardia ? Tikosyn proarrhtyhmia   . Cataracts, bilateral    Past Surgical History  Procedure Laterality Date  . Cardioversion    . Cholecystectomy      history of  . Partial gastrectomy      history of  . Shoulder surgery      left shoulder repair with 2 pens inserted  . Whipple procedure      operation  . Sarcoma removal from rigth tricep    . Inguinal hernia repair      inguinal herniorrhaphy, right... history of  . Appendectomy      history of   . Cardioversion  03/07/2012    Procedure: CARDIOVERSION;  Surgeon: Deboraha Sprang, MD;  Location: [facility];  Service: Cardiovascular;  Laterality: N/A;    reports that he quit smoking about 23 years ago. His smoking use included Pipe. He has never used smokeless tobacco. He reports that he does not drink alcohol or use illicit drugs. family history includes Arthritis in his mother and sister; Cancer in his brother; Coronary artery disease in his brother and sister; Coronary artery disease (age of onset: 49) in his father; Diabetes in his sister; Fibromyalgia in his sister; Heart disease in his brother and father; Osteoarthritis (age of onset: 110) in his mother; Peripheral vascular disease (age of onset: 15) in his sister. Allergies  Allergen Reactions  . Clindamycin Other (See Comments)    Reaction unknown  . Oxycodone-Acetaminophen Other (See Comments)    Reaction unknown  . Penicillins     REACTION: causes hives   Current Outpatient Prescriptions on File Prior to Visit  Medication Sig Dispense Refill  . amiodarone (PACERONE) 200 MG tablet Take 1 tablet (200 mg total) by mouth as directed. Take 1 tablet by mouth five day a week.  90 tablet  3  . apixaban (ELIQUIS) 2.5  MG TABS tablet Take 1 tablet (2.5 mg total) by mouth 2 (two) times daily.  60 tablet  0  . Ascorbic Acid (VITAMIN C) 500 MG tablet Take 500 mg by mouth daily.       . carvedilol (COREG) 3.125 MG tablet Take 1 tablet (3.125 mg total) by mouth 2 (two) times daily with a meal.  180 tablet  3  . CVS GLUCOSAMINE-CHONDROITIN PO Take 1 tablet by mouth daily. 1200/600 mg      . Emollient (AMLACTIN XL) LOTN Apply 1 application topically daily. Apply lotion to legs      . finasteride (PROSCAR) 5 MG tablet Take 1 tablet (5 mg total) by mouth daily.  90 tablet  3  . insulin aspart (NOVOLOG FLEXPEN) 100 UNIT/ML injection Inject 6 Units into the skin 3 (three) times daily before meals. Except 5 units before lunch.  30 mL  3  . insulin detemir  (LEVEMIR) 100 UNIT/ML injection Inject 0.1 mLs (10 Units total) into the skin at bedtime.  9 mL  3  . Insulin Pen Needle (COMFORT EZ PEN NEEDLES) 31G X 5 MM MISC 120 Devices by Does not apply route 4 (four) times daily.  120 each  3  . lipase/protease/amylase (CREON-10/PANCREASE) 12000 UNITS CPEP Take 1 capsule by mouth daily.       Marland Kitchen lisinopril (PRINIVIL,ZESTRIL) 2.5 MG tablet Take 1 tablet (2.5 mg total) by mouth daily.  90 tablet  3  . Multiple Vitamins-Minerals (CENTRUM SILVER PO) Take 1 tablet by mouth daily.       . Probiotic Product (PROBIOTIC DAILY PO) Take 1 tablet by mouth daily. 3 tabs twice a day with meals.      Marland Kitchen SALINE NA Place 1 spray into both nostrils daily.      . tamsulosin (FLOMAX) 0.4 MG CAPS Take 1 capsule (0.4 mg total) by mouth 2 (two) times daily.  180 capsule  3   No current facility-administered medications on file prior to visit.   Review of Systems  Constitutional: Negative for unusual diaphoresis or other sweats  HENT: Negative for ringing in ear Eyes: Negative for double vision or worsening visual disturbance.  Respiratory: Negative for choking and stridor.   Gastrointestinal: Negative for vomiting or other signifcant bowel change Genitourinary: Negative for hematuria or decreased urine volume.  Musculoskeletal: Negative for other MSK pain or swelling Skin: Negative for color change and worsening wound.  Neurological: Negative for tremors and numbness other than noted  Psychiatric/Behavioral: Negative for decreased concentration or agitation other than above       Objective:   Physical Exam BP 130/62  Pulse 71  Temp(Src) 97.3 F (36.3 C) (Oral)  Resp 12  Wt 193 lb 1.3 oz (87.581 kg)  SpO2 97% VS noted,  Constitutional: Pt appears well-developed, well-nourished.  HENT: Head: NCAT.  Right Ear: External ear normal.  Left Ear: External ear normal.  Eyes: . Pupils are equal, round, and reactive to light. Conjunctivae and EOM are normal Neck: Normal  range of motion. Neck supple.  Cardiovascular: Normal rate and regular rhythm.   Pulmonary/Chest: Effort normal and breath sounds normal.  Abd:  Soft, NT, ND, + BS Neurological: Pt is alert. Not confused , motor grossly intact Skin: Skin is warm. No rash Psychiatric: Pt behavior is normal. No agitation.     Assessment & Plan:

## 2014-03-20 ENCOUNTER — Other Ambulatory Visit: Payer: Self-pay | Admitting: Internal Medicine

## 2014-03-20 LAB — LIPID PANEL
CHOL/HDL RATIO: 3
CHOLESTEROL: 136 mg/dL (ref 0–200)
HDL: 41.7 mg/dL (ref >39.00)
LDL Cholesterol: 81 mg/dL (ref 0–99)
TRIGLYCERIDES: 65 mg/dL (ref 0.0–149.0)
VLDL: 13 mg/dL (ref 0.0–40.0)

## 2014-03-20 LAB — HEPATIC FUNCTION PANEL
ALBUMIN: 3.6 g/dL (ref 3.5–5.2)
ALT: 32 U/L (ref 0–53)
AST: 34 U/L (ref 0–37)
Alkaline Phosphatase: 139 U/L — ABNORMAL HIGH (ref 39–117)
BILIRUBIN TOTAL: 0.5 mg/dL (ref 0.3–1.2)
Bilirubin, Direct: 0.1 mg/dL (ref 0.0–0.3)
Total Protein: 7.2 g/dL (ref 6.0–8.3)

## 2014-03-20 LAB — BASIC METABOLIC PANEL
BUN: 22 mg/dL (ref 6–23)
CO2: 29 mEq/L (ref 19–32)
CREATININE: 1.2 mg/dL (ref 0.4–1.5)
Calcium: 9 mg/dL (ref 8.4–10.5)
Chloride: 105 mEq/L (ref 96–112)
GFR: 63.96 mL/min (ref >60.00)
GLUCOSE: 94 mg/dL (ref 70–99)
Potassium: 5.1 mEq/L (ref 3.5–5.1)
Sodium: 137 mEq/L (ref 135–145)

## 2014-03-20 LAB — TSH: TSH: 6.89 u[IU]/mL — ABNORMAL HIGH (ref 0.35–5.50)

## 2014-03-20 MED ORDER — LEVOTHYROXINE SODIUM 25 MCG PO TABS: 25.0000 ug | ORAL_TABLET | Freq: Every day | ORAL | Status: DC

## 2014-03-22 DIAGNOSIS — E039 Hypothyroidism, unspecified: Secondary | ICD-10-CM | POA: Insufficient documentation

## 2014-03-22 NOTE — Assessment & Plan Note (Addendum)
Will try change to toujeo 14 units qd, with decr novolog to 4 units tid/ac to help avoid further lower sugars  Note:  Total time for pt hx, exam, review of record with pt in the room, determination of diagnoses and plan for further eval and tx is > 40 min, with over 50% spent in coordination and counseling of patient

## 2014-03-22 NOTE — Assessment & Plan Note (Signed)
stable overall by history and exam, urged pt to start the eliquis, and pt to otherwise continue medical treatment as before,  to f/u any worsening symptoms or concerns

## 2014-03-22 NOTE — Assessment & Plan Note (Signed)
For f/u lab, consider low dose levothyroxine

## 2014-03-22 NOTE — Assessment & Plan Note (Signed)
.  stable overall by history and exam, recent data reviewed with pt, and pt to continue medical treatment as before,  to f/u any worsening symptoms or concerns Lab Results  Component Value Date   WBC 6.0 03/19/2014   HGB 12.6* 03/19/2014   HCT 37.6* 03/19/2014   PLT 162.0 03/19/2014   GLUCOSE 94 03/19/2014   CHOL 136 03/19/2014   TRIG 65.0 03/19/2014   HDL 41.70 03/19/2014   LDLCALC 81 03/19/2014   ALT 32 03/19/2014   AST 34 03/19/2014   NA 137 03/19/2014   K 5.1 03/19/2014   CL 105 03/19/2014   CREATININE 1.2 03/19/2014   BUN 22 03/19/2014   CO2 29 03/19/2014   TSH 6.89* 03/19/2014   PSA 2.28 01/04/2010   INR 1.41 07/07/2013   HGBA1C 6.4 03/19/2014   MICROALBUR 0.7 03/19/2014

## 2014-03-24 ENCOUNTER — Telehealth: Payer: Self-pay

## 2014-03-24 NOTE — Telephone Encounter (Signed)
The patient called and stated the wrong cynthroid rx was called into the pharmacy.   He is hoping to discuss this with the [facility] when possible, thanks!   Callback - [phone]

## 2014-03-24 NOTE — Telephone Encounter (Signed)
Called pt no answer LMOM RTC.../lmb 

## 2014-03-26 NOTE — Telephone Encounter (Signed)
Called the patient this morning.  He just had a question on new thyroid rx sent in due to lab result (evidently no one had called to explain his results to him and he did  Not know why thyroid rx was sent in).  He does understand all now.  HOWEVER, you gave him a sample of the new Toujeo last Thursday 03/19/14 to try.  He did take on Thursday evening before bed, woke around 1:30 with tingling around mouth and tongue.  He did check his BS and it was ok at 136.  PLEASE add Toujeo to his allergy list as he has taken no more and back on the levemir.

## 2014-03-26 NOTE — Telephone Encounter (Signed)
Unlikely to be an allergy rxn but will add as an "intolerance"

## 2014-04-09 ENCOUNTER — Encounter: Payer: Self-pay | Admitting: Internal Medicine

## 2014-04-09 NOTE — Telephone Encounter (Signed)
This is highly unlikely to have anything to do with the thyroid medicatin, as the dose is extremely small  OK to continue, I would encourage taking the medication every day, and consider other cause for the fatigue

## 2014-04-11 ENCOUNTER — Encounter: Payer: Self-pay | Admitting: Internal Medicine

## 2014-04-15 ENCOUNTER — Other Ambulatory Visit: Payer: Self-pay

## 2014-04-15 ENCOUNTER — Other Ambulatory Visit: Payer: Self-pay | Admitting: Internal Medicine

## 2014-04-15 DIAGNOSIS — E119 Type 2 diabetes mellitus without complications: Secondary | ICD-10-CM

## 2014-04-15 MED ORDER — APIXABAN 2.5 MG PO TABS: 2.5000 mg | ORAL_TABLET | Freq: Two times a day (BID) | ORAL | Status: DC

## 2014-04-15 MED ORDER — TAMSULOSIN HCL 0.4 MG PO CAPS: 0.4000 mg | ORAL_CAPSULE | Freq: Two times a day (BID) | ORAL | Status: DC

## 2014-04-15 MED ORDER — FINASTERIDE 5 MG PO TABS: 5.0000 mg | ORAL_TABLET | Freq: Every day | ORAL | Status: DC

## 2014-04-18 ENCOUNTER — Telehealth: Payer: Self-pay | Admitting: Internal Medicine

## 2014-04-18 NOTE — Telephone Encounter (Signed)
Ok with me 

## 2014-04-18 NOTE — Telephone Encounter (Signed)
Pt wants to switch from Dr. Jenny Reichmann to Dr. Ronnald Ramp.  Former pt of Dr. Linda Hedges.  Will this be ok?

## 2014-04-18 NOTE — Telephone Encounter (Signed)
yes

## 2014-04-22 NOTE — Telephone Encounter (Signed)
Pt is aware and scheduled an appt with Dr. Ronnald Ramp.

## 2014-04-23 ENCOUNTER — Encounter: Payer: Self-pay | Admitting: Podiatry

## 2014-04-23 ENCOUNTER — Ambulatory Visit (INDEPENDENT_AMBULATORY_CARE_PROVIDER_SITE_OTHER): Payer: Medicare Other | Admitting: Podiatry

## 2014-04-23 ENCOUNTER — Telehealth: Payer: Self-pay | Admitting: *Deleted

## 2014-04-23 VITALS — BP 111/54 | HR 60 | Resp 12

## 2014-04-23 DIAGNOSIS — E1049 Type 1 diabetes mellitus with other diabetic neurological complication: Secondary | ICD-10-CM

## 2014-04-23 DIAGNOSIS — I2510 Atherosclerotic heart disease of native coronary artery without angina pectoris: Secondary | ICD-10-CM

## 2014-04-23 DIAGNOSIS — M79609 Pain in unspecified limb: Secondary | ICD-10-CM

## 2014-04-23 NOTE — Telephone Encounter (Signed)
Faxed medical clearance for cataract extraction with intraocular lens implantations of the right eye followed by the left on 6/1. Faxed clearance to [facility] and [facility]. Pt may stop Apixaban the evening prior to surgery.

## 2014-04-23 NOTE — Progress Notes (Signed)
   Subjective:    Patient ID: [name], male    DOB: [birth year], 80 y.o.   MRN: [id number]  HPI PT STATED HIS DIABETIC AND BOTH UNDER THE FEET HAVE BURNING SENSATION AND PAINFUL FOR 1 MONTH. THE FEET BEEN THE SAME  NOT GETTING WORSE. THE FEET GET AGGRAVATED WALKING, ESPECIALLY THE END OF THE DAY. TRIED NO TREATMENT.  He says that the burning is not preventing from falling asleep or or staying asleep.  Review of Systems  Hematological: Bruises/bleeds easily.  All other systems reviewed and are negative.      Objective:   Physical Exam Orientated x3 space white male presents with his wife  Vascular: DP and PT pulses 2/4 bilaterally Capillary fill is immediate bilaterally  Neurological: Sensation to 10 g monofilament wire intact 5/5 bilaterally Ankle reflexes were reactive bilaterally Vibratory sensation nonreactive bilaterally  Dermatological: No skin lesions noted  Musculoskeletal: Patient has stable gait pattern Medial arch contour is medium bilaterally Taylor's bunion deformity noted bilaterally       Assessment & Plan:   Assessment: Satisfactory vascular status Beginning signs of diabetic peripheral neuropathy Protective sensation intact Taylor's bunion deformity  Plan: This patient symptoms are minimal at this time I advised patient to try over-the-counter vitamin B. Lipoic acid combination Return if symptoms are worsening or at yearly intervals

## 2014-04-23 NOTE — Patient Instructions (Signed)
Consider vitamin therapy Diabetes and Foot Care Diabetes may cause you to have problems because of poor blood supply (circulation) to your feet and legs. This may cause the skin on your feet to become thinner, break easier, and heal more slowly. Your skin may become dry, and the skin may peel and crack. You may also have nerve damage in your legs and feet causing decreased feeling in them. You may not notice minor injuries to your feet that could lead to infections or more serious problems. Taking care of your feet is one of the most important things you can do for yourself.  HOME CARE INSTRUCTIONS  Wear shoes at all times, even in the house. Do not go barefoot. Bare feet are easily injured.  Check your feet daily for blisters, cuts, and redness. If you cannot see the bottom of your feet, use a mirror or ask someone for help.  Wash your feet with warm water (do not use hot water) and mild soap. Then pat your feet and the areas between your toes until they are completely dry. Do not soak your feet as this can dry your skin.  Apply a moisturizing lotion or petroleum jelly (that does not contain alcohol and is unscented) to the skin on your feet and to dry, brittle toenails. Do not apply lotion between your toes.  Trim your toenails straight across. Do not dig under them or around the cuticle. File the edges of your nails with an emery board or nail file.  Do not cut corns or calluses or try to remove them with medicine.  Wear clean socks or stockings every day. Make sure they are not too tight. Do not wear knee-high stockings since they may decrease blood flow to your legs.  Wear shoes that fit properly and have enough cushioning. To break in new shoes, wear them for just a few hours a day. This prevents you from injuring your feet. Always look in your shoes before you put them on to be sure there are no objects inside.  Do not cross your legs. This may decrease the blood flow to your feet.  If  you find a minor scrape, cut, or break in the skin on your feet, keep it and the skin around it clean and dry. These areas may be cleansed with mild soap and water. Do not cleanse the area with peroxide, alcohol, or iodine.  When you remove an adhesive bandage, be sure not to damage the skin around it.  If you have a wound, look at it several times a day to make sure it is healing.  Do not use heating pads or hot water bottles. They may burn your skin. If you have lost feeling in your feet or legs, you may not know it is happening until it is too late.  Make sure your health care provider performs a complete foot exam at least annually or more often if you have foot problems. Report any cuts, sores, or bruises to your health care provider immediately. SEEK MEDICAL CARE IF:   You have an injury that is not healing.  You have cuts or breaks in the skin.  You have an ingrown nail.  You notice redness on your legs or feet.  You feel burning or tingling in your legs or feet.  You have pain or cramps in your legs and feet.  Your legs or feet are numb.  Your feet always feel cold. SEEK IMMEDIATE MEDICAL CARE IF:   There  is increasing redness, swelling, or pain in or around a wound.  There is a red line that goes up your leg.  Pus is coming from a wound.  You develop a fever or as directed by your health care provider.  You notice a bad smell coming from an ulcer or wound. Document Released: 11/11/2000 Document Revised: 07/17/2013 Document Reviewed: 04/23/2013 [facility] Patient Information 2014 [place].

## 2014-04-25 ENCOUNTER — Other Ambulatory Visit (INDEPENDENT_AMBULATORY_CARE_PROVIDER_SITE_OTHER): Payer: Medicare Other

## 2014-04-25 ENCOUNTER — Encounter: Payer: Self-pay | Admitting: Internal Medicine

## 2014-04-25 ENCOUNTER — Ambulatory Visit (INDEPENDENT_AMBULATORY_CARE_PROVIDER_SITE_OTHER): Payer: Medicare Other | Admitting: Internal Medicine

## 2014-04-25 VITALS — BP 126/74 | HR 66 | Temp 97.8°F | Resp 16 | Ht 74.0 in | Wt 194.0 lb

## 2014-04-25 DIAGNOSIS — E1165 Type 2 diabetes mellitus with hyperglycemia: Secondary | ICD-10-CM

## 2014-04-25 DIAGNOSIS — I2510 Atherosclerotic heart disease of native coronary artery without angina pectoris: Secondary | ICD-10-CM

## 2014-04-25 DIAGNOSIS — R946 Abnormal results of thyroid function studies: Secondary | ICD-10-CM

## 2014-04-25 DIAGNOSIS — IMO0001 Reserved for inherently not codable concepts without codable children: Secondary | ICD-10-CM

## 2014-04-25 DIAGNOSIS — D638 Anemia in other chronic diseases classified elsewhere: Secondary | ICD-10-CM | POA: Insufficient documentation

## 2014-04-25 DIAGNOSIS — D510 Vitamin B12 deficiency anemia due to intrinsic factor deficiency: Secondary | ICD-10-CM

## 2014-04-25 DIAGNOSIS — R7989 Other specified abnormal findings of blood chemistry: Secondary | ICD-10-CM

## 2014-04-25 LAB — CBC WITH DIFFERENTIAL/PLATELET
BASOS ABS: 0 10*3/uL (ref 0.0–0.1)
BASOS PCT: 0.4 % (ref 0.0–3.0)
EOS ABS: 0.6 10*3/uL (ref 0.0–0.7)
Eosinophils Relative: 11.1 % — ABNORMAL HIGH (ref 0.0–5.0)
HEMATOCRIT: 36.4 % — AB (ref 39.0–52.0)
HEMOGLOBIN: 12 g/dL — AB (ref 13.0–17.0)
LYMPHS ABS: 0.9 10*3/uL (ref 0.7–4.0)
Lymphocytes Relative: 17.3 % (ref 12.0–46.0)
MCHC: 32.8 g/dL (ref 30.0–36.0)
MCV: 98.9 fl (ref 78.0–100.0)
Monocytes Absolute: 0.6 10*3/uL (ref 0.1–1.0)
Monocytes Relative: 11.6 % (ref 3.0–12.0)
Neutro Abs: 3.3 10*3/uL (ref 1.4–7.7)
Neutrophils Relative %: 59.6 % (ref 43.0–77.0)
Platelets: 153 10*3/uL (ref 150.0–400.0)
RBC: 3.68 Mil/uL — AB (ref 4.22–5.81)
RDW: 14 % (ref 11.5–15.5)
WBC: 5.5 10*3/uL (ref 4.0–10.5)

## 2014-04-25 LAB — RETICULOCYTES
ABS RETIC: 33.2 10*3/uL (ref 19.0–186.0)
RBC.: 3.69 MIL/uL — AB (ref 4.22–5.81)
Retic Ct Pct: 0.9 % (ref 0.4–2.3)

## 2014-04-25 LAB — IBC PANEL
Iron: 74 ug/dL (ref 42–165)
SATURATION RATIOS: 28.5 % (ref 20.0–50.0)
Transferrin: 185.2 mg/dL — ABNORMAL LOW (ref 212.0–360.0)

## 2014-04-25 LAB — FERRITIN: Ferritin: 109.3 ng/mL (ref 22.0–322.0)

## 2014-04-25 NOTE — Progress Notes (Signed)
Pre visit review using our clinic review tool, if applicable. No additional management support is needed unless otherwise documented below in the visit note. 

## 2014-04-25 NOTE — Assessment & Plan Note (Signed)
My order for B12 and folate was blocked by the software I will recheck his CBC and his iron level

## 2014-04-25 NOTE — Patient Instructions (Signed)

## 2014-04-25 NOTE — Progress Notes (Signed)
Subjective:    Patient ID: [name], male    DOB: 07-14-1925, 80 y.o.   MRN: [id number]  Thyroid Problem Presents for follow-up visit. Patient reports no anxiety, cold intolerance, constipation, depressed mood, diaphoresis, diarrhea, dry skin, fatigue, hair loss, heat intolerance, hoarse voice, leg swelling, nail problem, palpitations, tremors, visual change, weight gain or weight loss. The symptoms have been stable. His past medical history is significant for atrial fibrillation and dementia. There is no history of diabetes, Graves' ophthalmopathy, heart failure, hyperlipidemia, neuropathy, obesity or osteopenia. Risk factors include amiodarone use.      Review of Systems  Constitutional: Negative.  Negative for fever, chills, weight loss, weight gain, diaphoresis, appetite change and fatigue.  HENT: Negative.  Negative for hoarse voice and nosebleeds.   Eyes: Negative.   Respiratory: Negative.  Negative for cough, choking, chest tightness, shortness of breath and stridor.   Cardiovascular: Negative.  Negative for chest pain, palpitations and leg swelling.  Gastrointestinal: Negative.  Negative for nausea, vomiting, abdominal pain, diarrhea, constipation, blood in stool, abdominal distention, anal bleeding and rectal pain.  Endocrine: Negative.  Negative for cold intolerance and heat intolerance.  Genitourinary: Negative.   Musculoskeletal: Negative.  Negative for arthralgias, back pain, gait problem, joint swelling, myalgias, neck pain and neck stiffness.  Skin: Negative.  Negative for rash.  Allergic/Immunologic: Negative.   Neurological: Negative.  Negative for dizziness, tremors, speech difficulty, weakness, light-headedness, numbness and headaches.       He complains of burning in both feet  Hematological: Negative.  Negative for adenopathy. Does not bruise/bleed easily.  Psychiatric/Behavioral: Negative.        Objective:   Physical Exam  Vitals  reviewed. Constitutional: He is oriented to person, place, and time. He appears well-developed and well-nourished. No distress.  HENT:  Head: Normocephalic and atraumatic.  Mouth/Throat: Oropharynx is clear and moist. No oropharyngeal exudate.  Eyes: Conjunctivae are normal. Right eye exhibits no discharge. Left eye exhibits no discharge. No scleral icterus.  Neck: Normal range of motion. Neck supple. No JVD present. No tracheal deviation present. No thyromegaly present.  Cardiovascular: Normal rate, regular rhythm, normal heart sounds and intact distal pulses.  Exam reveals no gallop and no friction rub.   No murmur heard. Pulmonary/Chest: Effort normal and breath sounds normal. No stridor. No respiratory distress. He has no wheezes. He has no rales. He exhibits no tenderness.  Abdominal: Soft. Bowel sounds are normal. He exhibits no distension and no mass. There is no tenderness. There is no rebound and no guarding.  Musculoskeletal: Normal range of motion. He exhibits no edema and no tenderness.  Lymphadenopathy:    He has no cervical adenopathy.  Neurological: He is oriented to person, place, and time.  Skin: Skin is warm and dry. No rash noted. He is not diaphoretic. No erythema. No pallor.  Psychiatric: He has a normal mood and affect. His behavior is normal. Judgment and thought content normal.     Lab Results  Component Value Date   WBC 6.0 03/19/2014   HGB 12.6* 03/19/2014   HCT 37.6* 03/19/2014   PLT 162.0 03/19/2014   GLUCOSE 94 03/19/2014   CHOL 136 03/19/2014   TRIG 65.0 03/19/2014   HDL 41.70 03/19/2014   LDLCALC 81 03/19/2014   ALT 32 03/19/2014   AST 34 03/19/2014   NA 137 03/19/2014   K 5.1 03/19/2014   CL 105 03/19/2014   CREATININE 1.2 03/19/2014   BUN 22 03/19/2014   CO2 29 03/19/2014  TSH 6.89* 03/19/2014   PSA 2.28 01/04/2010   INR 1.41 07/07/2013   HGBA1C 6.4 03/19/2014   MICROALBUR 0.7 03/19/2014       Assessment & Plan:

## 2014-04-26 ENCOUNTER — Encounter: Payer: Self-pay | Admitting: Internal Medicine

## 2014-04-27 NOTE — Assessment & Plan Note (Signed)
His blood sugars are well controlled 

## 2014-04-27 NOTE — Assessment & Plan Note (Signed)
He did not tolerate low dose synthroid very well so will observe off thyroid replacement for now

## 2014-04-28 ENCOUNTER — Ambulatory Visit: Payer: Self-pay | Admitting: Podiatry

## 2014-05-19 ENCOUNTER — Telehealth: Payer: Self-pay | Admitting: *Deleted

## 2014-05-19 NOTE — Telephone Encounter (Signed)
Calling back about my shoes that were supposed to have been ordered.  My doctor's name is Dr. [name] at [facility] over on [place]

## 2014-05-19 NOTE — Telephone Encounter (Signed)
Calling about pair of shoes we talked about last time I was there to see Dr. Amalia Hailey.  I returned his call.  He wanted to know the status of his Diabetic shoes.  I checked [employer] and an order had not been placed.  I told him the order had not been placed.  I will get the form filled out by Dr. Amalia Hailey and start the process.  I asked him the name of his Diabetic doctor.  He could not remember, said it's a new doctor.  He said he would call me back with that name.

## 2014-06-13 ENCOUNTER — Encounter: Payer: Medicare Other | Admitting: Podiatry

## 2014-06-13 NOTE — Progress Notes (Signed)
   Subjective:    Patient ID: [name], male    DOB: [birth year], 80 y.o.   MRN: [id number]  HPI Comments: Pt was measured for diabetic shoes and molded for custom diabetic inserts.  Pt wanted the [employer] black double strap [id number], and is measured 13 regular.  Diabetes      Review of Systems     Objective:   Physical Exam        Assessment & Plan:

## 2014-06-19 ENCOUNTER — Encounter: Payer: Medicare Other | Admitting: Internal Medicine

## 2014-06-25 ENCOUNTER — Encounter: Payer: Self-pay | Admitting: Internal Medicine

## 2014-06-25 ENCOUNTER — Ambulatory Visit (INDEPENDENT_AMBULATORY_CARE_PROVIDER_SITE_OTHER): Payer: Medicare Other | Admitting: Internal Medicine

## 2014-06-25 ENCOUNTER — Other Ambulatory Visit (INDEPENDENT_AMBULATORY_CARE_PROVIDER_SITE_OTHER): Payer: Medicare Other

## 2014-06-25 VITALS — BP 112/58 | HR 60 | Temp 97.6°F | Resp 16 | Ht 74.0 in | Wt 189.0 lb

## 2014-06-25 DIAGNOSIS — E1165 Type 2 diabetes mellitus with hyperglycemia: Principal | ICD-10-CM

## 2014-06-25 DIAGNOSIS — C248 Malignant neoplasm of overlapping sites of biliary tract: Secondary | ICD-10-CM

## 2014-06-25 DIAGNOSIS — IMO0001 Reserved for inherently not codable concepts without codable children: Secondary | ICD-10-CM

## 2014-06-25 DIAGNOSIS — I480 Paroxysmal atrial fibrillation: Secondary | ICD-10-CM

## 2014-06-25 DIAGNOSIS — I4891 Unspecified atrial fibrillation: Secondary | ICD-10-CM

## 2014-06-25 DIAGNOSIS — D638 Anemia in other chronic diseases classified elsewhere: Secondary | ICD-10-CM

## 2014-06-25 DIAGNOSIS — R7989 Other specified abnormal findings of blood chemistry: Secondary | ICD-10-CM

## 2014-06-25 DIAGNOSIS — I2510 Atherosclerotic heart disease of native coronary artery without angina pectoris: Secondary | ICD-10-CM

## 2014-06-25 DIAGNOSIS — I872 Venous insufficiency (chronic) (peripheral): Secondary | ICD-10-CM

## 2014-06-25 LAB — BASIC METABOLIC PANEL
BUN: 20 mg/dL (ref 6–23)
CHLORIDE: 105 meq/L (ref 96–112)
CO2: 25 meq/L (ref 19–32)
Calcium: 8.9 mg/dL (ref 8.4–10.5)
Creatinine, Ser: 1.2 mg/dL (ref 0.4–1.5)
GFR: 62.05 mL/min (ref >60.00)
Glucose, Bld: 136 mg/dL — ABNORMAL HIGH (ref 70–99)
Potassium: 4.6 mEq/L (ref 3.5–5.1)
SODIUM: 138 meq/L (ref 135–145)

## 2014-06-25 LAB — CBC WITH DIFFERENTIAL/PLATELET
Basophils Absolute: 0 10*3/uL (ref 0.0–0.1)
Basophils Relative: 0.4 % (ref 0.0–3.0)
Eosinophils Absolute: 0.5 10*3/uL (ref 0.0–0.7)
Eosinophils Relative: 8.9 % — ABNORMAL HIGH (ref 0.0–5.0)
HCT: 36.6 % — ABNORMAL LOW (ref 39.0–52.0)
HEMOGLOBIN: 12.2 g/dL — AB (ref 13.0–17.0)
LYMPHS PCT: 16.6 % (ref 12.0–46.0)
Lymphs Abs: 1 10*3/uL (ref 0.7–4.0)
MCHC: 33.3 g/dL (ref 30.0–36.0)
MCV: 98.5 fl (ref 78.0–100.0)
MONOS PCT: 10.6 % (ref 3.0–12.0)
Monocytes Absolute: 0.6 10*3/uL (ref 0.1–1.0)
NEUTROS ABS: 3.7 10*3/uL (ref 1.4–7.7)
Neutrophils Relative %: 63.5 % (ref 43.0–77.0)
Platelets: 142 10*3/uL — ABNORMAL LOW (ref 150.0–400.0)
RBC: 3.71 Mil/uL — ABNORMAL LOW (ref 4.22–5.81)
RDW: 14 % (ref 11.5–15.5)
WBC: 5.8 10*3/uL (ref 4.0–10.5)

## 2014-06-25 LAB — HEMOGLOBIN A1C: HEMOGLOBIN A1C: 6.5 % (ref 4.6–6.5)

## 2014-06-25 LAB — T3, FREE: T3 FREE: 2.8 pg/mL (ref 2.3–4.2)

## 2014-06-25 LAB — TSH: TSH: 5.85 u[IU]/mL — AB (ref 0.35–4.50)

## 2014-06-25 NOTE — Progress Notes (Signed)
Subjective:    Patient ID: [name], male    DOB: 1925-09-04, 80 y.o.   MRN: [id number]  Diabetes He presents for his follow-up diabetic visit. He has type 2 diabetes mellitus. His disease course has been stable. There are no hypoglycemic associated symptoms. Pertinent negatives for hypoglycemia include no dizziness or tremors. Pertinent negatives for diabetes include no blurred vision, no chest pain, no fatigue, no foot paresthesias, no foot ulcerations, no polydipsia, no polyphagia, no polyuria, no visual change, no weakness and no weight loss. There are no hypoglycemic complications. Symptoms are stable. There are no diabetic complications. Current diabetic treatment includes insulin injections and intensive insulin program. He is compliant with treatment all of the time. His weight is stable. He is following a generally healthy diet. Meal planning includes avoidance of concentrated sweets. He has not had a previous visit with a dietician. He participates in exercise intermittently. His breakfast blood glucose range is generally 70-90 mg/dl. His lunch blood glucose range is generally 140-180 mg/dl. His dinner blood glucose range is generally 140-180 mg/dl. His highest blood glucose is 180-200 mg/dl. His overall blood glucose range is 130-140 mg/dl. An ACE inhibitor/angiotensin II receptor blocker is being taken. He sees a podiatrist.Eye exam is current.      Review of Systems  Constitutional: Negative.  Negative for fever, chills, weight loss, diaphoresis, appetite change and fatigue.  HENT: Negative.   Eyes: Negative.  Negative for blurred vision.  Respiratory: Negative.  Negative for cough, choking, chest tightness, shortness of breath and stridor.   Cardiovascular: Negative.  Negative for chest pain, palpitations and leg swelling.  Gastrointestinal: Negative.  Negative for nausea, vomiting, abdominal pain, diarrhea and constipation.  Endocrine: Negative.  Negative for polydipsia,  polyphagia and polyuria.  Genitourinary: Negative.   Musculoskeletal: Negative.  Negative for arthralgias, back pain, joint swelling, myalgias and neck stiffness.  Skin: Negative.  Negative for rash.  Allergic/Immunologic: Negative.   Neurological: Negative.  Negative for dizziness, tremors, syncope, weakness, light-headedness and numbness.  Hematological: Negative.  Negative for adenopathy. Does not bruise/bleed easily.  Psychiatric/Behavioral: Negative.        Objective:   Physical Exam  Vitals reviewed. Constitutional: He is oriented to person, place, and time. He appears well-developed and well-nourished. No distress.  HENT:  Head: Normocephalic and atraumatic.  Mouth/Throat: Oropharynx is clear and moist. No oropharyngeal exudate.  Eyes: Conjunctivae are normal. Right eye exhibits no discharge. Left eye exhibits no discharge. No scleral icterus.  Neck: Normal range of motion. Neck supple. No JVD present. No tracheal deviation present. No thyromegaly present.  Cardiovascular: Normal rate, regular rhythm, S1 normal, S2 normal and intact distal pulses.  Exam reveals no gallop, no S3 and no S4.   Murmur heard.  Decrescendo systolic murmur is present with a grade of 1/6   No diastolic murmur is present  Pulses:      Carotid pulses are 1+ on the right side, and 1+ on the left side.      Radial pulses are 1+ on the right side, and 1+ on the left side.       Femoral pulses are 1+ on the right side, and 1+ on the left side.      Popliteal pulses are 1+ on the right side, and 1+ on the left side.       Dorsalis pedis pulses are 1+ on the right side, and 1+ on the left side.       Posterior tibial pulses are 1+  on the right side, and 1+ on the left side.  Pulmonary/Chest: Effort normal and breath sounds normal. No stridor. No respiratory distress. He has no wheezes. He has no rales. He exhibits no tenderness.  Abdominal: Soft. Bowel sounds are normal. He exhibits no distension and no mass.  There is no tenderness. There is no rebound and no guarding.  Musculoskeletal: He exhibits no edema and no tenderness.  Lymphadenopathy:    He has no cervical adenopathy.  Neurological: He is oriented to person, place, and time.  Skin: Skin is warm and dry. No rash noted. He is not diaphoretic. No erythema. No pallor.  Psychiatric: He has a normal mood and affect. His behavior is normal. Judgment and thought content normal.     Lab Results  Component Value Date   WBC 5.5 04/25/2014   HGB 12.0* 04/25/2014   HCT 36.4* 04/25/2014   PLT 153.0 04/25/2014   GLUCOSE 94 03/19/2014   CHOL 136 03/19/2014   TRIG 65.0 03/19/2014   HDL 41.70 03/19/2014   LDLCALC 81 03/19/2014   ALT 32 03/19/2014   AST 34 03/19/2014   NA 137 03/19/2014   K 5.1 03/19/2014   CL 105 03/19/2014   CREATININE 1.2 03/19/2014   BUN 22 03/19/2014   CO2 29 03/19/2014   TSH 6.89* 03/19/2014   PSA 2.28 01/04/2010   INR 1.41 07/07/2013   HGBA1C 6.4 03/19/2014   MICROALBUR 0.7 03/19/2014       Assessment & Plan:

## 2014-06-25 NOTE — Progress Notes (Signed)
Pre visit review using our clinic review tool, if applicable. No additional management support is needed unless otherwise documented below in the visit note. 

## 2014-06-25 NOTE — Patient Instructions (Signed)

## 2014-06-26 NOTE — Assessment & Plan Note (Signed)
His blood sugars are well controlled 

## 2014-06-26 NOTE — Assessment & Plan Note (Signed)
This is stable 

## 2014-07-28 ENCOUNTER — Encounter: Payer: Self-pay | Admitting: Podiatry

## 2014-07-28 ENCOUNTER — Ambulatory Visit (INDEPENDENT_AMBULATORY_CARE_PROVIDER_SITE_OTHER): Payer: Medicare Other | Admitting: Podiatry

## 2014-07-28 VITALS — BP 107/53 | HR 63 | Resp 15

## 2014-07-28 DIAGNOSIS — M21619 Bunion of unspecified foot: Secondary | ICD-10-CM

## 2014-07-28 DIAGNOSIS — E1165 Type 2 diabetes mellitus with hyperglycemia: Secondary | ICD-10-CM

## 2014-07-28 DIAGNOSIS — M2010 Hallux valgus (acquired), unspecified foot: Secondary | ICD-10-CM

## 2014-07-28 DIAGNOSIS — IMO0001 Reserved for inherently not codable concepts without codable children: Secondary | ICD-10-CM

## 2014-07-28 NOTE — Patient Instructions (Signed)

## 2014-07-28 NOTE — Progress Notes (Signed)
   Subjective:    Patient ID: [name], male    DOB: 1925-08-09, 80 y.o.   MRN: [id number]  HPI Comments: Pt presents for diabetic shoe instruction and pick up.  Pt is fitted with [id number]  13 EE and custom molded inserts, pt states he likes the shoe and it fits well.  Oral and printed instructions are given.       Review of Systems     Objective:   Physical Exam  Diabetic shoes with custom molded multilaminated insoles contour satisfactorily      Assessment & Plan:   Assessment: Satisfactory fit of diabetic shoes and custom insoles Type 2 diabetes with neurological manifestations  Tailor's bunion deformity bilaterally  Plan: Diabetic shoes and custom insoles dispensed with her structure provided  Reappoint at patient's request

## 2014-08-07 ENCOUNTER — Ambulatory Visit: Payer: Medicare Other | Admitting: Internal Medicine

## 2014-08-07 IMAGING — CT CT ABDOMEN W/O CM
2 of 4 series · 17 of 46 positions shown, 19 images · non-contrast
Comparison: CT 07/06/2013

CLINICAL DATA: Followup left adrenal hematoma.

EXAM:
CT ABDOMEN WITHOUT CONTRAST
TECHNIQUE: Multidetector CT imaging of the abdomen was performed following the
standard protocol without IV contrast.

[Series 2: ap without · axial · non-contrast · 0.70mm/px · z∈[-244,+26]mm · 14 of 60 slices shown, 16 images]
[im 3/60  soft-tissue]
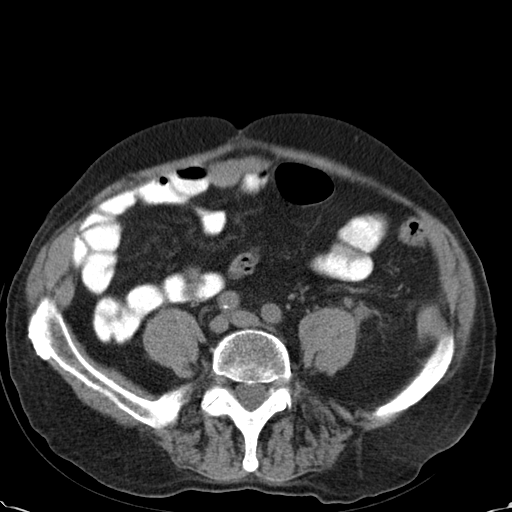
[im 3/60  bone]
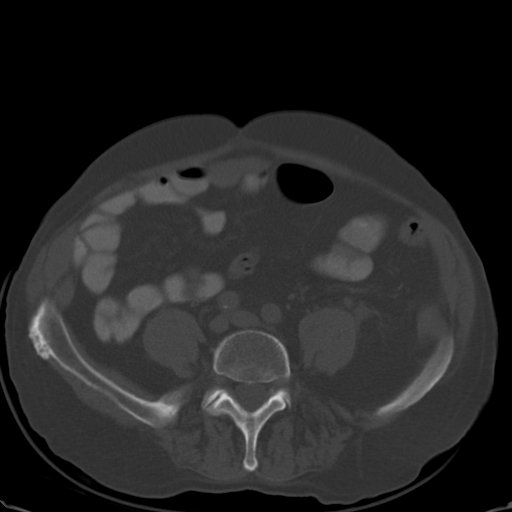
[im 9/60  soft-tissue]
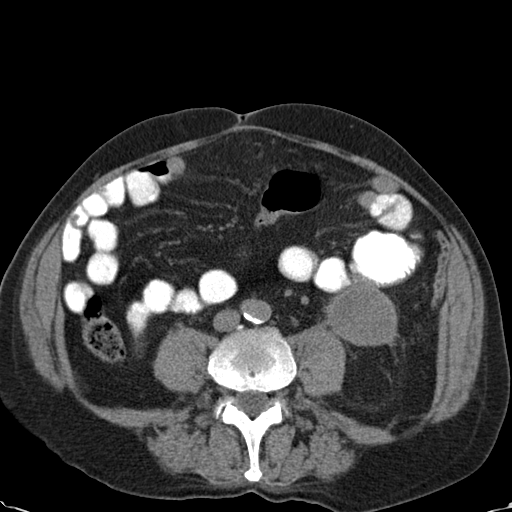
[im 11/60  soft-tissue]
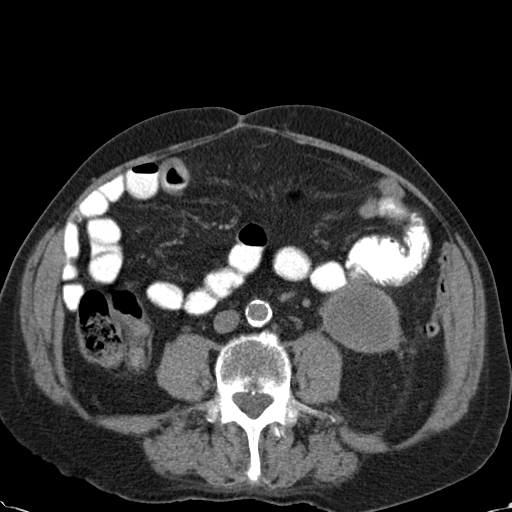
[im 17/60  soft-tissue]
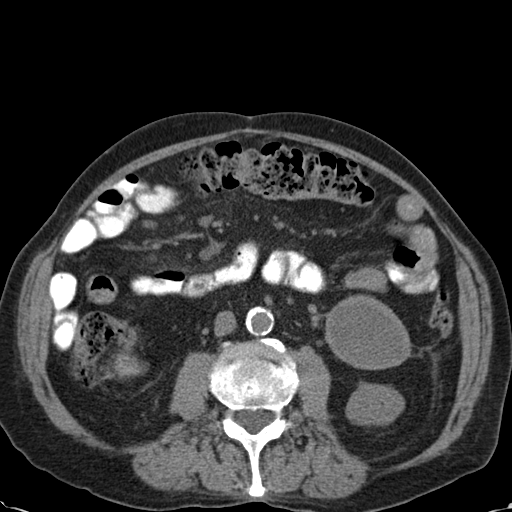
[im 19/60  soft-tissue]
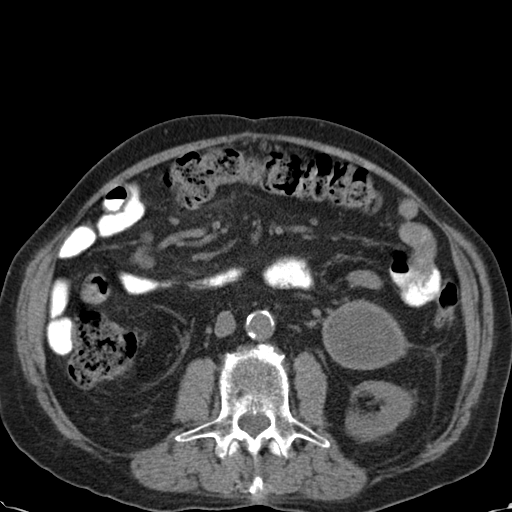
[im 25/60  soft-tissue]
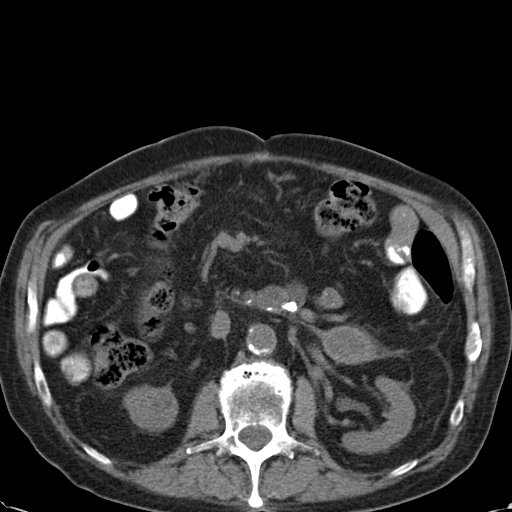
[im 27/60  soft-tissue]
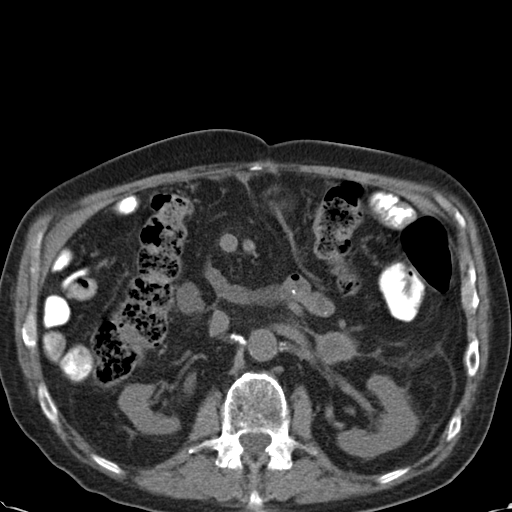
[im 33/60  soft-tissue]
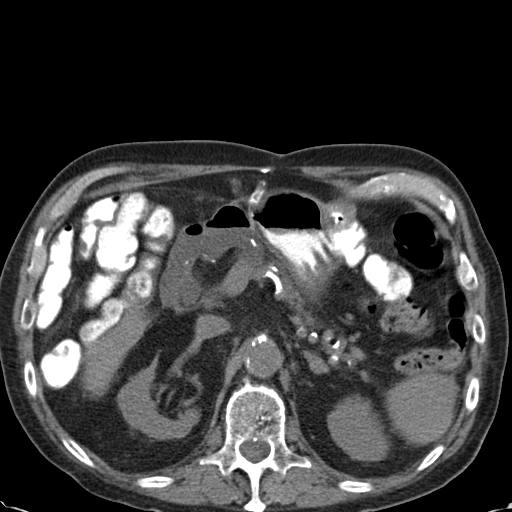
[im 35/60  soft-tissue]
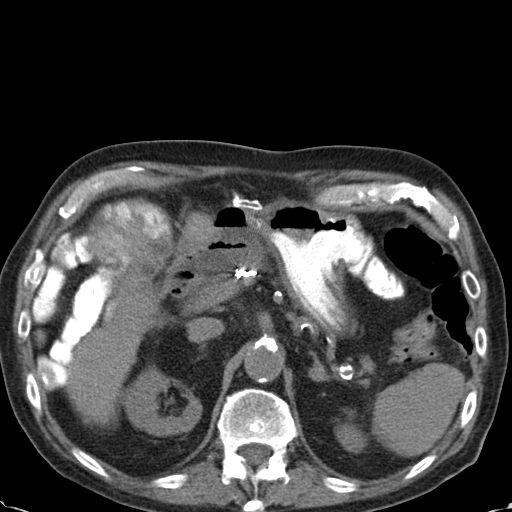
[im 35/60  bone]
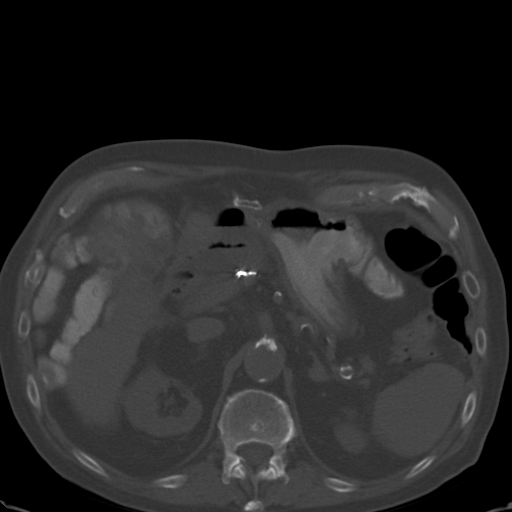
[im 41/60  soft-tissue]
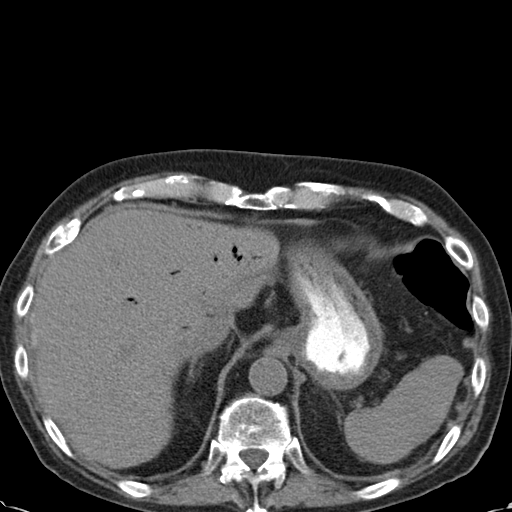
[im 43/60  soft-tissue]
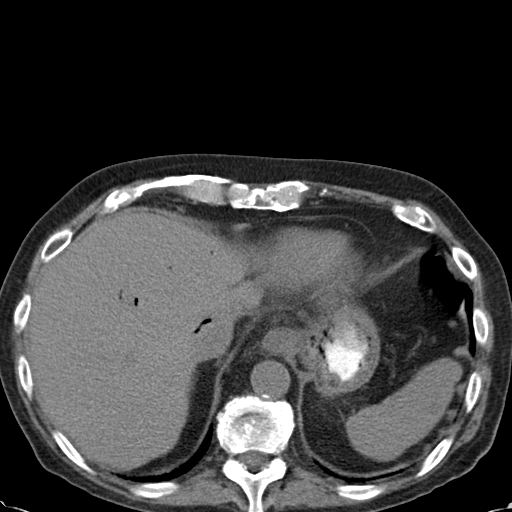
[im 49/60  soft-tissue]
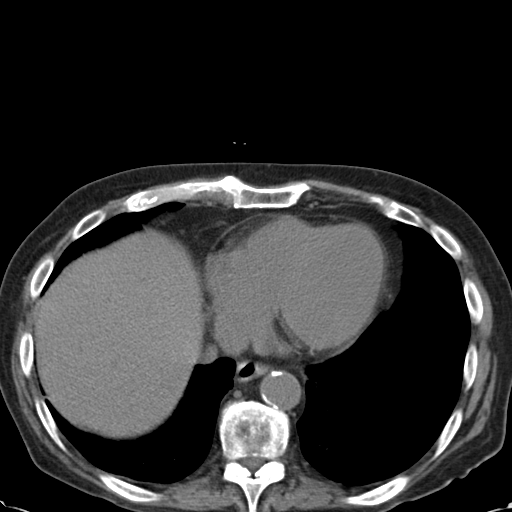
[im 51/60  soft-tissue]
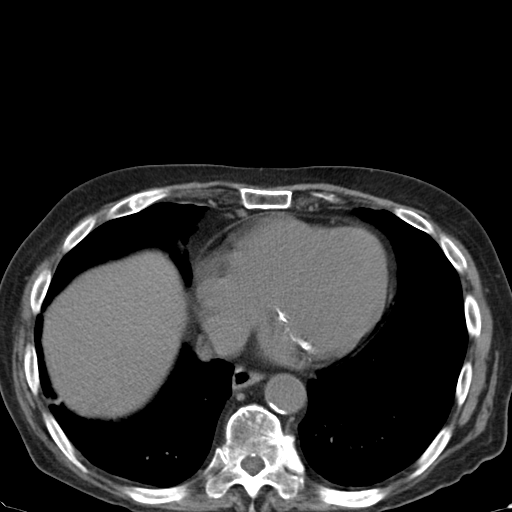
[im 57/60  soft-tissue]
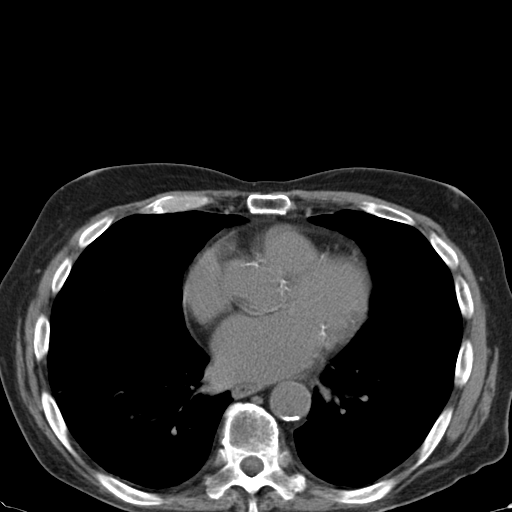

[Series 602: cor · coronal · 0.70mm/px · 3 of 122 slices shown]
[im 41/122  soft-tissue]
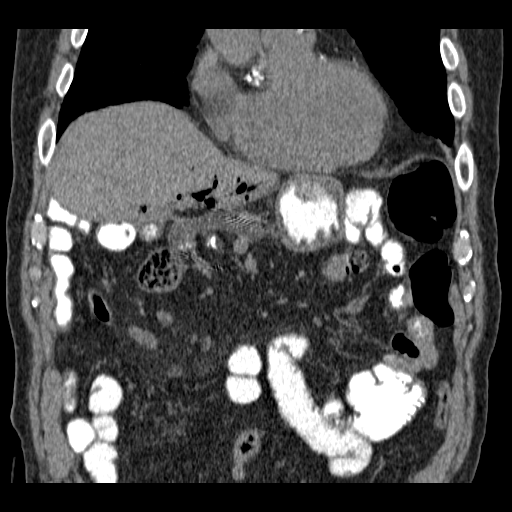
[im 54/122  soft-tissue]
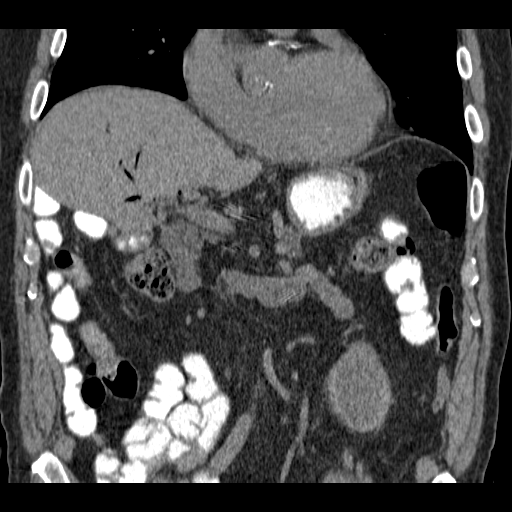
[im 68/122  soft-tissue]
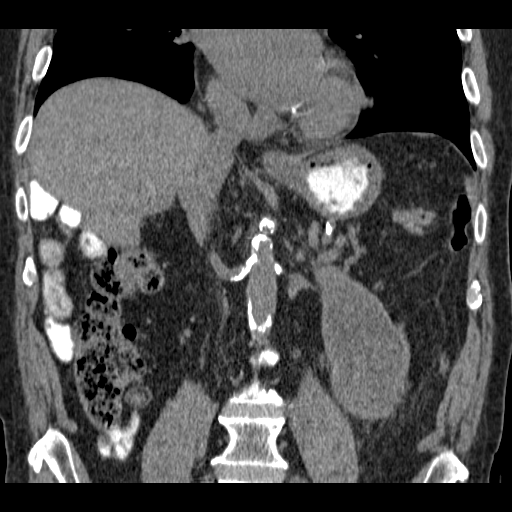

[17 of 46 positions shown; findings below may reference images not displayed]

FINDINGS: There has been considerable reduction in volume of the left
retroperitoneal hematoma. Hematoma measures 6.0 x 4.4 x 10.1 cm
compared to 9.5 x 7.6 x 18.7 cm on prior. Centrally within the
hematoma the tissue is low attenuation consistent withliquefaction
of the hematoma. Hematoma does extends superiorly to the left
adrenal gland.

Lung bases are clear.  No pericardial fluid.

There is pneumobilia within the nondependent left hepatic lobe.
Patient status post Whipple procedure. The pancreatic tail is
normal. Pancreas is atrophic. The spleen is normal. Mild nodule
enlargement of the left adrenal gland is again demonstrated. Right
adrenal gland is normal. No renal abnormality on this noncontrast
exam. The stomach, small bowel, limited view of the colon are
unremarkable.
IMPRESSION: Reduction in volume of retroperitoneal hematoma extending from the
left adrenal gland.

## 2014-09-23 ENCOUNTER — Other Ambulatory Visit: Payer: Self-pay | Admitting: Internal Medicine

## 2014-09-25 ENCOUNTER — Ambulatory Visit: Payer: Medicare Other | Admitting: Internal Medicine

## 2014-09-26 ENCOUNTER — Other Ambulatory Visit: Payer: Self-pay

## 2014-09-26 MED ORDER — INSULIN DETEMIR 100 UNIT/ML ~~LOC~~ SOLN: 10.0000 [IU] | Freq: Every day | SUBCUTANEOUS | Status: DC

## 2014-09-29 ENCOUNTER — Encounter: Payer: Self-pay | Admitting: Internal Medicine

## 2014-09-29 ENCOUNTER — Ambulatory Visit (INDEPENDENT_AMBULATORY_CARE_PROVIDER_SITE_OTHER): Payer: Medicare Other | Admitting: Internal Medicine

## 2014-09-29 ENCOUNTER — Other Ambulatory Visit: Payer: Self-pay | Admitting: Internal Medicine

## 2014-09-29 VITALS — BP 104/52 | HR 64 | Ht 74.0 in | Wt 187.0 lb

## 2014-09-29 DIAGNOSIS — I480 Paroxysmal atrial fibrillation: Secondary | ICD-10-CM

## 2014-09-29 DIAGNOSIS — I2510 Atherosclerotic heart disease of native coronary artery without angina pectoris: Secondary | ICD-10-CM

## 2014-09-29 NOTE — Patient Instructions (Signed)
Your physician recommends that you continue on your current medications as directed. Please refer to the Current Medication list given to you today.  Your physician wants you to follow-up in: 6 months with Dr. Klein. You will receive a reminder letter in the mail two months in advance. If you don't receive a letter, please call our office to schedule the follow-up appointment.  

## 2014-09-29 NOTE — Progress Notes (Signed)
Patient Care Team: Janith Lima, MD as PCP - General (Internal Medicine) Deboraha Sprang, MD as Consulting Physician (Cardiology) Johnny Bridge, MD as Consulting Physician (Orthopedic Surgery)   HPI  [name] is a 80 y.o. male seen in followup for paroxysmal atrial fibrillation for which he  Took Tikosyn but now is on amiodarone and tolerating well without recurrent afib.   He had a life-threatening retroperitoneal bleed which prompted the discontinuation of his anticoagulation. It was his decision at follow-up 3/15 to forego further anticoagulation.  His had a recent cough and was taking dextromethorphan and loratadine. Both of them were notated on amiodarone drug interactions. Review this suggests that both of them could have her levels increased by amiodarone inhibition of the metabolism.  Past Medical History  Diagnosis Date  . Acid reflux disease     history of  . H/O: GI bleed   . Paroxysmal atrial fibrillation     chronic anticoag; tikosyn  . Benign prostatic hypertrophy     history of  . Hepatic damage march 2009    retained hepatic stone , intrahepatic duct catheter  . Diabetes mellitus     insulin dep  . CAD (coronary artery disease)   . Hypertension   . 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 Seizures   . Ventricular tachycardia ? Tikosyn proarrhtyhmia   . Cataracts, bilateral     Past Surgical History  Procedure Laterality Date  . Cardioversion    . Cholecystectomy      history of  . Partial gastrectomy      history of  . Shoulder surgery      left shoulder repair with 2 pens inserted  . Whipple procedure      operation  . Sarcoma removal from rigth tricep    . Inguinal hernia repair      inguinal herniorrhaphy, right... history of  . Appendectomy      history of  . Cardioversion  03/07/2012   Procedure: CARDIOVERSION;  Surgeon: Deboraha Sprang, MD;  Location: [facility];  Service: Cardiovascular;  Laterality: N/A;    Current Outpatient Prescriptions  Medication Sig Dispense Refill  . amiodarone (PACERONE) 200 MG tablet Take 1 tablet (200 mg total) by mouth as directed. Take 1 tablet by mouth five day a week. 90 tablet 3  . Ascorbic Acid (VITAMIN C) 500 MG tablet Take 500 mg by mouth daily.     . carvedilol (COREG) 3.125 MG tablet Take 1 tablet (3.125 mg total) by mouth 2 (two) times daily with a meal. 180 tablet 3  . [facility] GLUCOSAMINE-CHONDROITIN PO Take 1 tablet by mouth daily. 1200/600 mg    . ELIQUIS 2.5 MG TABS tablet Take 1 tablet by mouth two  times daily 180 tablet 0  . Emollient (AMLACTIN XL) LOTN Apply 1 application topically daily. Apply lotion to legs    . finasteride (PROSCAR) 5 MG tablet Take 1 tablet (5 mg total) by mouth daily. 90 tablet 3  . insulin aspart (NOVOLOG FLEXPEN) 100 UNIT/ML injection Inject 6 Units into the skin 3 (three) times daily before meals. Except 5 units before lunch. 30 mL 3  . insulin detemir (LEVEMIR) 100 UNIT/ML injection Inject 0.1 mLs (10 Units total) into the skin at bedtime. 30 mL 3  . Insulin  Pen Needle (COMFORT EZ PEN NEEDLES) 31G X 5 MM MISC 120 Devices by Does not apply route 4 (four) times daily. 120 each 3  . lipase/protease/amylase (CREON-10/PANCREASE) 12000 UNITS CPEP Take 1 capsule by mouth daily.     Marland Kitchen lisinopril (PRINIVIL,ZESTRIL) 2.5 MG tablet Take 1 tablet (2.5 mg total) by mouth daily. 90 tablet 3  . Multiple Vitamins-Minerals (CENTRUM SILVER PO) Take 1 tablet by mouth daily.     . Probiotic Product (PROBIOTIC DAILY PO) Take 3 tablets by mouth daily. 3 tabs once a day with meals.    Marland Kitchen SALINE NA Place 1 spray into both nostrils daily.    . tamsulosin (FLOMAX) 0.4 MG CAPS capsule Take 1 capsule (0.4 mg total) by mouth 2 (two) times daily. 180 capsule 3   No current facility-administered medications for this visit.    Allergies    Allergen Reactions  . Clindamycin Other (See Comments)    Reaction unknown  . Other Other (See Comments)    toujeo with perioral tingling only - pt refuses to take further  . Oxycodone-Acetaminophen Other (See Comments)    Reaction unknown  . Penicillins Hives    REACTION: causes hives    Review of Systems negative except from HPI and PMH  Physical Exam BP 104/52 mmHg  Pulse 64  Ht 6\' 2"  (1.88 m)  Wt 187 lb (84.823 kg)  BMI 24.00 kg/m2 Well developed and nourished in no acute distress HENT normal Neck supple with JVP-flat Clear Regular rate and rhythm, no murmurs or gallops Abd-soft with active BS No Clubbing cyanosis edema Skin-warm and dry A & Oriented  Grossly normal sensory and motor function   ECG demonstrates sinus rhythm at 61 intervals 21/09/44 Prior septal infarct Axis -21 No changes since 2012    Assessment and  Plan  Atrial fibrillation on amiodarone  History of retroperitoneal bleed  TSH elevation likely secondary to amiodarone  Ventricular tachycardia  No intercurrent Ventricular tachycardia    We will continue him on amiodarone for his atrial fibrillation. TSH was a little lower a couple of months ago. Transaminases were normal in April. He will be rechecked by his PCP later this month.

## 2014-10-06 ENCOUNTER — Other Ambulatory Visit: Payer: Self-pay

## 2014-10-06 MED ORDER — CARVEDILOL 3.125 MG PO TABS: 3.1250 mg | ORAL_TABLET | Freq: Two times a day (BID) | ORAL | Status: DC

## 2014-10-06 MED ORDER — LISINOPRIL 2.5 MG PO TABS: 2.5000 mg | ORAL_TABLET | Freq: Every day | ORAL | Status: DC

## 2014-10-09 ENCOUNTER — Other Ambulatory Visit: Payer: Self-pay | Admitting: *Deleted

## 2014-10-09 MED ORDER — INSULIN PEN NEEDLE 31G X 5 MM MISC: 120.0000 | Freq: Four times a day (QID) | Status: DC

## 2014-10-24 ENCOUNTER — Ambulatory Visit: Payer: Medicare Other | Admitting: Internal Medicine

## 2014-10-27 ENCOUNTER — Ambulatory Visit (INDEPENDENT_AMBULATORY_CARE_PROVIDER_SITE_OTHER): Payer: Medicare Other | Admitting: Internal Medicine

## 2014-10-27 ENCOUNTER — Other Ambulatory Visit (INDEPENDENT_AMBULATORY_CARE_PROVIDER_SITE_OTHER): Payer: Medicare Other

## 2014-10-27 ENCOUNTER — Encounter: Payer: Self-pay | Admitting: Internal Medicine

## 2014-10-27 VITALS — BP 120/50 | HR 60 | Temp 97.8°F | Resp 16 | Ht 74.0 in | Wt 189.0 lb

## 2014-10-27 DIAGNOSIS — D638 Anemia in other chronic diseases classified elsewhere: Secondary | ICD-10-CM

## 2014-10-27 DIAGNOSIS — E279 Disorder of adrenal gland, unspecified: Secondary | ICD-10-CM

## 2014-10-27 DIAGNOSIS — K8689 Other specified diseases of pancreas: Secondary | ICD-10-CM

## 2014-10-27 DIAGNOSIS — E038 Other specified hypothyroidism: Secondary | ICD-10-CM

## 2014-10-27 DIAGNOSIS — K868 Other specified diseases of pancreas: Secondary | ICD-10-CM

## 2014-10-27 DIAGNOSIS — J301 Allergic rhinitis due to pollen: Secondary | ICD-10-CM

## 2014-10-27 DIAGNOSIS — E118 Type 2 diabetes mellitus with unspecified complications: Secondary | ICD-10-CM

## 2014-10-27 DIAGNOSIS — E278 Other specified disorders of adrenal gland: Secondary | ICD-10-CM | POA: Insufficient documentation

## 2014-10-27 DIAGNOSIS — I2510 Atherosclerotic heart disease of native coronary artery without angina pectoris: Secondary | ICD-10-CM

## 2014-10-27 DIAGNOSIS — S37812S Contusion of adrenal gland, sequela: Secondary | ICD-10-CM

## 2014-10-27 LAB — CBC WITH DIFFERENTIAL/PLATELET
Basophils Absolute: 0 10*3/uL (ref 0.0–0.1)
Basophils Relative: 0.8 % (ref 0.0–3.0)
EOS ABS: 0.6 10*3/uL (ref 0.0–0.7)
Eosinophils Relative: 9.6 % — ABNORMAL HIGH (ref 0.0–5.0)
HCT: 35.6 % — ABNORMAL LOW (ref 39.0–52.0)
HEMOGLOBIN: 11.5 g/dL — AB (ref 13.0–17.0)
Lymphocytes Relative: 20.5 % (ref 12.0–46.0)
Lymphs Abs: 1.3 10*3/uL (ref 0.7–4.0)
MCHC: 32.3 g/dL (ref 30.0–36.0)
MCV: 99.3 fl (ref 78.0–100.0)
MONO ABS: 0.8 10*3/uL (ref 0.1–1.0)
Monocytes Relative: 12.6 % — ABNORMAL HIGH (ref 3.0–12.0)
NEUTROS ABS: 3.5 10*3/uL (ref 1.4–7.7)
Neutrophils Relative %: 56.5 % (ref 43.0–77.0)
Platelets: 146 10*3/uL — ABNORMAL LOW (ref 150.0–400.0)
RBC: 3.59 Mil/uL — AB (ref 4.22–5.81)
RDW: 14 % (ref 11.5–15.5)
WBC: 6.2 10*3/uL (ref 4.0–10.5)

## 2014-10-27 LAB — T3, FREE: T3, Free: 2.9 pg/mL (ref 2.3–4.2)

## 2014-10-27 LAB — COMPREHENSIVE METABOLIC PANEL
ALK PHOS: 126 U/L — AB (ref 39–117)
ALT: 23 U/L (ref 0–53)
AST: 28 U/L (ref 0–37)
Albumin: 3.5 g/dL (ref 3.5–5.2)
BUN: 25 mg/dL — AB (ref 6–23)
CO2: 26 mEq/L (ref 19–32)
CREATININE: 1.1 mg/dL (ref 0.4–1.5)
Calcium: 8.5 mg/dL (ref 8.4–10.5)
Chloride: 105 mEq/L (ref 96–112)
GFR: 65.85 mL/min (ref >60.00)
Glucose, Bld: 75 mg/dL (ref 70–99)
Potassium: 4.8 mEq/L (ref 3.5–5.1)
Sodium: 137 mEq/L (ref 135–145)
Total Bilirubin: 0.3 mg/dL (ref 0.2–1.2)
Total Protein: 6.5 g/dL (ref 6.0–8.3)

## 2014-10-27 LAB — LIPID PANEL
CHOL/HDL RATIO: 3
Cholesterol: 129 mg/dL (ref 0–200)
HDL: 39.2 mg/dL (ref >39.00)
LDL CALC: 76 mg/dL (ref 0–99)
NONHDL: 89.8
TRIGLYCERIDES: 67 mg/dL (ref 0.0–149.0)
VLDL: 13.4 mg/dL (ref 0.0–40.0)

## 2014-10-27 LAB — HEMOGLOBIN A1C: HEMOGLOBIN A1C: 6.7 % — AB (ref 4.6–6.5)

## 2014-10-27 LAB — T4, FREE: FREE T4: 0.8 ng/dL (ref 0.60–1.60)

## 2014-10-27 LAB — TSH: TSH: 5.76 u[IU]/mL — ABNORMAL HIGH (ref 0.35–4.50)

## 2014-10-27 MED ORDER — PANCRELIPASE (LIP-PROT-AMYL) 12000-38000 UNITS PO CPEP: 12000.0000 [IU] | ORAL_CAPSULE | Freq: Three times a day (TID) | ORAL | Status: DC

## 2014-10-27 MED ORDER — FLUTICASONE PROPIONATE 50 MCG/ACT NA SUSP: 2.0000 | Freq: Every day | NASAL | Status: DC

## 2014-10-27 NOTE — Progress Notes (Signed)
Pre visit review using our clinic review tool, if applicable. No additional management support is needed unless otherwise documented below in the visit note. 

## 2014-10-27 NOTE — Progress Notes (Signed)
Subjective:    Patient ID: [name], male    DOB: [birth year], 80 y.o.   MRN: [id number]  Diabetes He presents for his follow-up diabetic visit. He has type 2 diabetes mellitus. His disease course has been stable. There are no hypoglycemic associated symptoms. Pertinent negatives for diabetes include no blurred vision, no chest pain, no fatigue, no foot paresthesias, no foot ulcerations, no polydipsia, no polyphagia, no polyuria, no visual change, no weakness and no weight loss. There are no hypoglycemic complications. Symptoms are stable. There are no diabetic complications. Current diabetic treatment includes intensive insulin program and insulin injections. He is compliant with treatment most of the time. He is following a generally healthy diet. Meal planning includes avoidance of concentrated sweets. He never participates in exercise. There is no change in his home blood glucose trend. An ACE inhibitor/angiotensin II receptor blocker is being taken. He does not see a podiatrist.Eye exam is current.      Review of Systems  Constitutional: Negative.  Negative for fever, chills, weight loss, diaphoresis, appetite change and fatigue.  HENT: Positive for congestion, postnasal drip and rhinorrhea. Negative for sinus pressure and sneezing.   Eyes: Negative.  Negative for blurred vision.  Respiratory: Negative.   Cardiovascular: Negative.  Negative for chest pain, palpitations and leg swelling.  Gastrointestinal: Negative.  Negative for nausea, vomiting, abdominal pain, diarrhea, constipation and blood in stool.  Endocrine: Negative.  Negative for polydipsia, polyphagia and polyuria.  Genitourinary: Negative.   Musculoskeletal: Negative.   Skin: Negative.   Allergic/Immunologic: Negative.   Neurological: Negative.  Negative for weakness.  Hematological: Negative.   Psychiatric/Behavioral: Negative.        Objective:   Physical Exam  Constitutional: He is oriented to person, place,  and time. He appears well-developed and well-nourished. No distress.  HENT:  Head: Normocephalic and atraumatic.  Nose: Mucosal edema present. No rhinorrhea. Right sinus exhibits no maxillary sinus tenderness and no frontal sinus tenderness. Left sinus exhibits no maxillary sinus tenderness and no frontal sinus tenderness.  Mouth/Throat: Oropharynx is clear and moist. No oropharyngeal exudate.  Eyes: Conjunctivae are normal. Right eye exhibits no discharge. Left eye exhibits no discharge. No scleral icterus.  Neck: Normal range of motion. Neck supple. No JVD present. No tracheal deviation present. No thyromegaly present.  Cardiovascular: Normal rate, regular rhythm, normal heart sounds and intact distal pulses.  Exam reveals no gallop and no friction rub.   No murmur heard. Pulmonary/Chest: Effort normal and breath sounds normal. No stridor. No respiratory distress. He has no wheezes. He has no rales. He exhibits no tenderness.  Abdominal: Soft. Bowel sounds are normal. He exhibits no distension and no mass. There is no tenderness. There is no rebound and no guarding.  Musculoskeletal: Normal range of motion. He exhibits no edema or tenderness.  Lymphadenopathy:    He has no cervical adenopathy.  Neurological: He is oriented to person, place, and time.  Skin: Skin is warm and dry. No rash noted. He is not diaphoretic. No erythema. No pallor.      Lab Results  Component Value Date   WBC 5.8 06/25/2014   HGB 12.2* 06/25/2014   HCT 36.6* 06/25/2014   PLT 142.0* 06/25/2014   GLUCOSE 136* 06/25/2014   CHOL 136 03/19/2014   TRIG 65.0 03/19/2014   HDL 41.70 03/19/2014   LDLCALC 81 03/19/2014   ALT 32 03/19/2014   AST 34 03/19/2014   NA 138 06/25/2014   K 4.6 06/25/2014   CL  105 06/25/2014   CREATININE 1.2 06/25/2014   BUN 20 06/25/2014   CO2 25 06/25/2014   TSH 5.85* 06/25/2014   PSA 2.28 01/04/2010   INR 1.41 07/07/2013   HGBA1C 6.5 06/25/2014   MICROALBUR 0.7 03/19/2014        Assessment & Plan:

## 2014-10-27 NOTE — Patient Instructions (Signed)

## 2014-10-28 ENCOUNTER — Encounter: Payer: Self-pay | Admitting: Internal Medicine

## 2014-10-28 DIAGNOSIS — K8689 Other specified diseases of pancreas: Secondary | ICD-10-CM | POA: Insufficient documentation

## 2014-10-28 LAB — T4: T4 TOTAL: 4.8 ug/dL (ref 4.5–12.0)

## 2014-10-28 MED ORDER — FLUTICASONE PROPIONATE 50 MCG/ACT NA SUSP: 2.0000 | Freq: Every day | NASAL | Status: DC

## 2014-10-28 NOTE — Assessment & Plan Note (Signed)
It has been a year since his last scan Will recheck the CT w contrast to see if this is stable

## 2014-10-28 NOTE — Assessment & Plan Note (Signed)
He is doing well on creon

## 2014-10-28 NOTE — Assessment & Plan Note (Signed)
His TSH has been slightly elevated but stable T3 and T4 are normal He takes amiodarone but appears euthyroid Will follow for now not on T replacement therapy

## 2014-10-28 NOTE — Assessment & Plan Note (Signed)
Will start flonase ns for this

## 2014-10-28 NOTE — Assessment & Plan Note (Signed)
His blood sugars are well controlled 

## 2014-10-29 ENCOUNTER — Encounter: Payer: Self-pay | Admitting: Internal Medicine

## 2014-11-03 ENCOUNTER — Encounter: Payer: Self-pay | Admitting: Internal Medicine

## 2014-11-09 ENCOUNTER — Encounter: Payer: Self-pay | Admitting: Internal Medicine

## 2014-11-10 ENCOUNTER — Encounter: Payer: Self-pay | Admitting: Internal Medicine

## 2014-11-12 ENCOUNTER — Other Ambulatory Visit: Payer: Self-pay | Admitting: Internal Medicine

## 2014-11-12 ENCOUNTER — Ambulatory Visit (INDEPENDENT_AMBULATORY_CARE_PROVIDER_SITE_OTHER)
Admission: RE | Admit: 2014-11-12 | Discharge: 2014-11-12 | Disposition: A | Discharge status: Home / Self Care | Payer: Medicare Other | Source: Ambulatory Visit | Attending: Internal Medicine | Admitting: Internal Medicine

## 2014-11-12 ENCOUNTER — Encounter: Payer: Self-pay | Admitting: Internal Medicine

## 2014-11-12 DIAGNOSIS — E279 Disorder of adrenal gland, unspecified: Principal | ICD-10-CM

## 2014-11-12 DIAGNOSIS — S37812S Contusion of adrenal gland, sequela: Secondary | ICD-10-CM

## 2014-11-12 DIAGNOSIS — S37818S Other injury of adrenal gland, sequela: Secondary | ICD-10-CM

## 2014-11-12 DIAGNOSIS — E278 Other specified disorders of adrenal gland: Secondary | ICD-10-CM

## 2014-11-18 ENCOUNTER — Encounter: Payer: Self-pay | Admitting: Internal Medicine

## 2014-11-18 ENCOUNTER — Other Ambulatory Visit: Payer: Self-pay | Admitting: Internal Medicine

## 2014-11-18 DIAGNOSIS — E118 Type 2 diabetes mellitus with unspecified complications: Secondary | ICD-10-CM

## 2014-11-18 MED ORDER — INSULIN LISPRO 200 UNIT/ML ~~LOC~~ SOPN: 18.0000 [IU] | PEN_INJECTOR | Freq: Three times a day (TID) | SUBCUTANEOUS | Status: DC

## 2014-12-15 ENCOUNTER — Other Ambulatory Visit: Payer: Self-pay | Admitting: Internal Medicine

## 2015-01-26 ENCOUNTER — Ambulatory Visit: Payer: Self-pay | Admitting: Internal Medicine

## 2015-01-26 DIAGNOSIS — I4891 Unspecified atrial fibrillation: Secondary | ICD-10-CM

## 2015-02-19 ENCOUNTER — Other Ambulatory Visit: Payer: Self-pay | Admitting: Internal Medicine

## 2015-02-25 ENCOUNTER — Ambulatory Visit (INDEPENDENT_AMBULATORY_CARE_PROVIDER_SITE_OTHER): Payer: Medicare Other | Admitting: Internal Medicine

## 2015-02-25 ENCOUNTER — Other Ambulatory Visit (INDEPENDENT_AMBULATORY_CARE_PROVIDER_SITE_OTHER): Payer: Medicare Other

## 2015-02-25 ENCOUNTER — Encounter: Payer: Self-pay | Admitting: Internal Medicine

## 2015-02-25 VITALS — BP 122/58 | HR 68 | Temp 97.8°F | Wt 184.5 lb

## 2015-02-25 DIAGNOSIS — E038 Other specified hypothyroidism: Secondary | ICD-10-CM

## 2015-02-25 DIAGNOSIS — E118 Type 2 diabetes mellitus with unspecified complications: Secondary | ICD-10-CM

## 2015-02-25 DIAGNOSIS — Z23 Encounter for immunization: Secondary | ICD-10-CM

## 2015-02-25 DIAGNOSIS — J301 Allergic rhinitis due to pollen: Secondary | ICD-10-CM | POA: Diagnosis not present

## 2015-02-25 LAB — URINALYSIS, ROUTINE W REFLEX MICROSCOPIC
Bilirubin Urine: NEGATIVE
Hgb urine dipstick: NEGATIVE
KETONES UR: NEGATIVE
Leukocytes, UA: NEGATIVE
NITRITE: NEGATIVE
PH: 5.5 (ref 5.0–8.0)
RBC / HPF: NONE SEEN (ref 0–?)
Specific Gravity, Urine: 1.02 (ref 1.000–1.030)
Total Protein, Urine: NEGATIVE
Urine Glucose: NEGATIVE
Urobilinogen, UA: 0.2 (ref 0.0–1.0)

## 2015-02-25 LAB — MICROALBUMIN / CREATININE URINE RATIO
CREATININE, U: 89.8 mg/dL
Microalb Creat Ratio: 0.8 mg/g (ref 0.0–30.0)
Microalb, Ur: <0.7 mg/dL (ref 0.0–1.9)

## 2015-02-25 LAB — BASIC METABOLIC PANEL
BUN: 18 mg/dL (ref 6–23)
CHLORIDE: 104 meq/L (ref 96–112)
CO2: 32 mEq/L (ref 19–32)
Calcium: 9.1 mg/dL (ref 8.4–10.5)
Creatinine, Ser: 1.1 mg/dL (ref 0.40–1.50)
GFR: 67.18 mL/min (ref >60.00)
Glucose, Bld: 150 mg/dL — ABNORMAL HIGH (ref 70–99)
POTASSIUM: 5.2 meq/L — AB (ref 3.5–5.1)
SODIUM: 135 meq/L (ref 135–145)

## 2015-02-25 LAB — TSH: TSH: 5.77 u[IU]/mL — AB (ref 0.35–4.50)

## 2015-02-25 LAB — GLUCOSE, POCT (MANUAL RESULT ENTRY): POC Glucose: 154 mg/dl — AB (ref 70–99)

## 2015-02-25 LAB — HEMOGLOBIN A1C: Hgb A1c MFr Bld: 6.3 % (ref 4.6–6.5)

## 2015-02-25 MED ORDER — CETIRIZINE HCL 10 MG PO TABS: 10.0000 mg | ORAL_TABLET | Freq: Every day | ORAL | Status: DC

## 2015-02-25 MED ORDER — INSULIN PEN NEEDLE 31G X 5 MM MISC: 120.0000 | Freq: Four times a day (QID) | Status: DC

## 2015-02-25 NOTE — Progress Notes (Signed)
Pre visit review using our clinic review tool, if applicable. No additional management support is needed unless otherwise documented below in the visit note. 

## 2015-02-25 NOTE — Patient Instructions (Signed)
Type 1 Diabetes Mellitus Type 1 diabetes mellitus, often simply referred to as diabetes, is a long-term (chronic) disease. It occurs when the islet cells in the pancreas that make insulin (a hormone) are destroyed and can no longer make insulin. Insulin is needed to move sugars from food into the tissue cells. The tissue cells use the sugars for energy. In people with type 1 diabetes, the sugars build up in the blood instead of going into the tissue cells. As a result, high blood sugar (hyperglycemia) develops. Without insulin, the body breaks down fat cells for the needed energy. This breakdown of fat cells produces acid chemicals (ketones), which increases the acid levels in the body. The effect of either high ketone or high sugar (glucose) levels can be life-threatening.  Type 1 diabetes was also previously called juvenile diabetes. It most often occurs before the age of 30, but it can occur at any age. RISK FACTORS A person is predisposed to developing type 1 diabetes if someone in his or her family has the disease and is exposed to certain additional environmental triggers.  SYMPTOMS  Symptoms of type 1 diabetes may develop gradually over days to weeks or suddenly. The symptoms occur due to hyperglycemia. The symptoms can include:   Increased thirst (polydipsia).  Increased urination (polyuria).  Increased urination during the night (nocturia).  Weight loss. This weight loss may be rapid.  Frequent, recurring infections.  Tiredness (fatigue).  Weakness.  Vision changes, such as blurred vision.  Fruity smell to your breath.  Abdominal pain.  Nausea or vomiting. DIAGNOSIS  Type 1 diabetes is diagnosed when symptoms of diabetes are present and when blood glucose levels are increased. Your blood glucose level may be checked by one or more of the following blood tests:  A fasting blood glucose test. You will not be allowed to eat for at least 8 hours before a blood sample is  taken.  A random blood glucose test. Your blood glucose is checked at any time of the day regardless of when you ate.  A hemoglobin A1c blood glucose test. A hemoglobin A1c test provides information about blood glucose control over the previous 3 months. TREATMENT  Although type 1 diabetes cannot be prevented, it can be managed with insulin, diet, and exercise.  You will need to take insulin daily to keep blood glucose in the desired range.  You will need to match insulin dosing with exercise and healthy food choices. The treatment goal is to maintain the before-meal blood sugar (preprandial glucose) level at 70-130 mg/dL.  HOME CARE INSTRUCTIONS   Have your hemoglobin A1c level checked twice a year.  Perform daily blood glucose monitoring as directed by your health care provider.  Monitor urine ketones when you are ill and as directed by your health care provider.  Take your insulin as directed by your health care provider to maintain your blood glucose level in the desired range.  Never run out of insulin. It is needed every day.  Adjust insulin based on your intake of carbohydrates. Carbohydrates can raise blood glucose levels but need to be included in your diet. Carbohydrates provide vitamins, minerals, and fiber, which are an essential part of a healthy diet. Carbohydrates are found in fruits, vegetables, whole grains, dairy products, legumes, and foods containing added sugars.  Eat healthy foods. Alternate 3 meals with 3 snacks.  Maintain a healthy weight.  Carry a medical alert card or wear your medical alert jewelry.  Carry a 15-gram carbohydrate snack   with you at all times to treat low blood glucose (hypoglycemia). Some examples of 15-gram carbohydrate snacks include:  Glucose tablets, 3 or 4.  Glucose gel, 15-gram tube.  Raisins, 2 tablespoons (24 grams).  Jelly beans, 6.  Animal crackers, 8.  Fruit juice, regular soda, or low-fat milk, 4 ounces (120  mL).  Gummy treats, 9.  Recognize hypoglycemia. Hypoglycemia occurs with blood glucose levels of 70 mg/dL and below. The risk for hypoglycemia increases when fasting or skipping meals, during or after intense exercise, and during sleep. Hypoglycemia symptoms can include:  Tremors or shakes.  Decreased ability to concentrate.  Sweating.  Increased heart rate.  Headache.  Dry mouth.  Hunger.  Irritability.  Anxiety.  Restless sleep.  Altered speech or coordination.  Confusion.  Treat hypoglycemia promptly. If you are alert and able to safely swallow, follow the 15:15 rule:  Take 15-20 grams of rapid-acting glucose or carbohydrate. Rapid-acting options include glucose gel, glucose tablets, or 4 ounces (120 mL) of fruit juice, regular soda, or low-fat milk.  Check your blood glucose level 15 minutes after taking the glucose.  Take 15-20 grams more of glucose if the repeat blood glucose level is still 70 mg/dL or below.  Eat a meal or snack within 1 hour once blood glucose levels return to normal.  Be alert to polyuria and polydipsia, which are early signs of hyperglycemia. An early awareness of hyperglycemia allows for prompt treatment. Treat hyperglycemia as directed by your health care provider.  Exercise regularly as directed by your health care provider. This includes:  Performing resistance training twice a week such as push-ups, sit-ups, lifting weights, or using resistance bands.  Performing 150 minutes of cardio exercises each week such as walking, running, or playing sports.  Staying active and spending no more than 90 minutes at one time being inactive.  Adjust your insulin dosing and food intake as needed if you start a new exercise or sport.  Follow your sick-day plan at any time you are unable to eat or drink as usual.   Do not use any tobacco products including cigarettes, chewing tobacco, or electronic cigarettes. If you need help quitting, ask your  health care provider.  Limit alcohol intake to no more than 1 drink per day for nonpregnant women and 2 drinks per day for men. You should drink alcohol only when you are also eating food. Talk with your health care provider about whether alcohol is safe for you. Tell your health care provider if you drink alcohol several times a week.  Keep all follow-up visits as directed by your health care provider.  Schedule an eye exam within 5 years of diagnosis and then annually.  Perform daily skin and foot care. Examine your skin and feet daily for cuts, bruises, redness, nail problems, bleeding, blisters, or sores. A foot exam by a health care provider should be done annually.  Brush your teeth and gums at least twice a day and floss at least once a day. Follow up with your dentist regularly.  Share your diabetes management plan with your workplace or school.  Stay up-to-date with immunizations. It is recommended that people with diabetes who are over 42 years old get the pneumonia vaccine. In some cases, two separate shots may be given. Ask your health care provider if your pneumonia vaccination is up-to-date.  Learn to manage stress.  Obtain ongoing diabetes education and support as needed.  Participate in or seek rehabilitation as needed to maintain or improve independence  and quality of life. Request a physical or occupational therapy referral if you are having foot or hand numbness, or difficulties with grooming, dressing, eating, or physical activity. SEEK MEDICAL CARE IF:   You are unable to eat food or drink fluids for more than 6 hours.  You have nausea and vomiting for more than 6 hours.  Your blood glucose level is over 240 mg/dL.  There is a change in mental status.  You develop an additional serious illness.  You have diarrhea for more than 6 hours.  You have been sick or have had a fever for a couple of days and are not getting better.  You have pain during any physical  activity. SEEK IMMEDIATE MEDICAL CARE IF:  You have difficulty breathing.  You have moderate to large ketone levels. MAKE SURE YOU:  Understand these instructions.  Will watch your condition.  Will get help right away if you are not doing well or get worse. Document Released: 11/11/2000 Document Revised: 03/31/2014 Document Reviewed: 06/12/2012 Exit[employer] Patient Information 2015 [employer]. This information is not intended to replace advice given to you by your health care provider. Make sure you discuss any questions you have with your health care provider.  

## 2015-02-25 NOTE — Progress Notes (Signed)
Subjective:    Patient ID: [name], male    DOB: 12-19-1924, 81 y.o.   MRN: [id number]  Diabetes He presents for his follow-up diabetic visit. He has type 1 diabetes mellitus. His disease course has been stable. There are no hypoglycemic associated symptoms. Pertinent negatives for hypoglycemia include no dizziness, headaches or tremors. Pertinent negatives for diabetes include no blurred vision, no chest pain, no fatigue, no foot paresthesias, no foot ulcerations, no polydipsia, no polyphagia, no polyuria, no visual change, no weakness and no weight loss. There are no hypoglycemic complications. Current diabetic treatment includes insulin injections and intensive insulin program. He is compliant with treatment all of the time. He is following a generally healthy diet. Meal planning includes avoidance of concentrated sweets. He participates in exercise intermittently. There is no change in his home blood glucose trend. An ACE inhibitor/angiotensin II receptor blocker is being taken. He does not see a podiatrist.Eye exam is current.      Review of Systems  Constitutional: Negative.  Negative for weight loss and fatigue.  HENT: Positive for postnasal drip and rhinorrhea. Negative for facial swelling, nosebleeds, sinus pressure, sneezing, sore throat and trouble swallowing.   Eyes: Negative.  Negative for blurred vision.  Respiratory: Negative.  Negative for cough, choking, chest tightness, shortness of breath and stridor.   Cardiovascular: Negative.  Negative for chest pain, palpitations and leg swelling.  Gastrointestinal: Negative.  Negative for nausea, vomiting, diarrhea, constipation and blood in stool.  Endocrine: Negative.  Negative for polydipsia, polyphagia and polyuria.  Genitourinary: Negative.   Musculoskeletal: Negative.  Negative for myalgias, back pain, arthralgias and neck pain.  Skin: Negative.   Allergic/Immunologic: Negative.   Neurological: Negative.  Negative for  dizziness, tremors, weakness, light-headedness and headaches.  Hematological: Negative.  Negative for adenopathy. Does not bruise/bleed easily.  Psychiatric/Behavioral: Negative.        Objective:   Physical Exam  Constitutional: He is oriented to person, place, and time. He appears well-developed and well-nourished. No distress.  HENT:  Head: Normocephalic and atraumatic.  Nose: Mucosal edema and rhinorrhea present. No sinus tenderness or nasal septal hematoma. No epistaxis. Right sinus exhibits no maxillary sinus tenderness and no frontal sinus tenderness. Left sinus exhibits no maxillary sinus tenderness and no frontal sinus tenderness.  Mouth/Throat: Oropharynx is clear and moist and mucous membranes are normal. Mucous membranes are not pale, not dry and not cyanotic. No oral lesions. No trismus in the jaw. No uvula swelling. No oropharyngeal exudate, posterior oropharyngeal edema, posterior oropharyngeal erythema or tonsillar abscesses.  Eyes: Conjunctivae are normal. Right eye exhibits no discharge. Left eye exhibits no discharge. No scleral icterus.  Neck: Normal range of motion. Neck supple. No JVD present. No tracheal deviation present. No thyromegaly present.  Cardiovascular: Normal rate, regular rhythm, normal heart sounds and intact distal pulses.  Exam reveals no gallop and no friction rub.   No murmur heard. Pulmonary/Chest: Effort normal and breath sounds normal. No stridor. No respiratory distress. He has no wheezes. He has no rales. He exhibits no tenderness.  Abdominal: Soft. Bowel sounds are normal. He exhibits no distension and no mass. There is no tenderness. There is no rebound and no guarding.  Musculoskeletal: Normal range of motion. He exhibits no edema or tenderness.  Lymphadenopathy:    He has no cervical adenopathy.  Neurological: He is oriented to person, place, and time.  Skin: Skin is warm and dry. No rash noted. He is not diaphoretic. No erythema. No pallor.  Vitals reviewed.    Lab Results  Component Value Date   WBC 6.2 10/27/2014   HGB 11.5* 10/27/2014   HCT 35.6* 10/27/2014   PLT 146.0* 10/27/2014   GLUCOSE 75 10/27/2014   CHOL 129 10/27/2014   TRIG 67.0 10/27/2014   HDL 39.20 10/27/2014   LDLCALC 76 10/27/2014   ALT 23 10/27/2014   AST 28 10/27/2014   NA 137 10/27/2014   K 4.8 10/27/2014   CL 105 10/27/2014   CREATININE 1.1 10/27/2014   BUN 25* 10/27/2014   CO2 26 10/27/2014   TSH 5.76* 10/27/2014   PSA 2.28 01/04/2010   INR 1.41 07/07/2013   HGBA1C 6.7* 10/27/2014   MICROALBUR 0.7 03/19/2014       Assessment & Plan:

## 2015-02-26 ENCOUNTER — Encounter: Payer: Self-pay | Admitting: Internal Medicine

## 2015-02-26 NOTE — Assessment & Plan Note (Signed)
His TSh is in the normal range Will stay on the current dose

## 2015-02-26 NOTE — Assessment & Plan Note (Signed)
Will start zyrtec for this

## 2015-02-26 NOTE — Assessment & Plan Note (Signed)
He was referred for an eye exam His blood sugars are very well controlled

## 2015-03-06 ENCOUNTER — Telehealth: Payer: Self-pay | Admitting: Internal Medicine

## 2015-03-06 NOTE — Telephone Encounter (Signed)
Is requesting orders for an increase in diabetic testing supplies to be faxed back.  Will fax again.

## 2015-03-30 LAB — HM DIABETES EYE EXAM

## 2015-03-31 ENCOUNTER — Ambulatory Visit: Payer: Medicare Other | Admitting: Internal Medicine

## 2015-03-31 LAB — HM DIABETES EYE EXAM

## 2015-04-06 ENCOUNTER — Encounter: Payer: Self-pay | Admitting: Internal Medicine

## 2015-04-06 ENCOUNTER — Ambulatory Visit (INDEPENDENT_AMBULATORY_CARE_PROVIDER_SITE_OTHER): Payer: Medicare Other | Admitting: Internal Medicine

## 2015-04-06 VITALS — BP 104/58 | HR 60 | Ht 74.0 in | Wt 184.0 lb

## 2015-04-06 DIAGNOSIS — Z79899 Other long term (current) drug therapy: Secondary | ICD-10-CM | POA: Diagnosis not present

## 2015-04-06 DIAGNOSIS — I480 Paroxysmal atrial fibrillation: Secondary | ICD-10-CM

## 2015-04-06 LAB — HEPATIC FUNCTION PANEL
ALT: 22 U/L (ref 0–53)
AST: 27 U/L (ref 0–37)
Albumin: 3.5 g/dL (ref 3.5–5.2)
Alkaline Phosphatase: 144 U/L — ABNORMAL HIGH (ref 39–117)
BILIRUBIN DIRECT: 0.1 mg/dL (ref 0.0–0.3)
BILIRUBIN TOTAL: 0.3 mg/dL (ref 0.2–1.2)
Total Protein: 6.9 g/dL (ref 6.0–8.3)

## 2015-04-06 NOTE — Progress Notes (Signed)
Patient Care Team: Janith Lima, MD as PCP - General (Internal Medicine) Deboraha Sprang, MD as Consulting Physician (Cardiology) Marchia Bond, MD as Consulting Physician (Orthopedic Surgery)   HPI  [name] is a 81 y.o. male seen in followup for paroxysmal atrial fibrillation for which he  Took Tikosyn but now is on amiodarone and tolerating well without recurrent afib.   He had a life-threatening retroperitoneal bleed which prompted the discontinuation of his anticoagulation. It was his decision at follow-up 3/15 to forego further anticoagulation.  He comes in today with a variety of concerns. One is that he has episodes of sudden warming. He took his blood pressure 1 of these occasions in a range from 109--92. He has only been able to do that one time.  He is also has some problems with nausea and fatigue. This seemed to improve when he increased his enzyme replacement therapy.  Physical lower extremity swelling but that has not been too problematic of late.  He lacks energy. He has a sleeping a couple of times a day after meals. His meals are relatively small and certainly not fat filled or hot.      Past Medical History  Diagnosis Date  . Acid reflux disease     history of  . H/O: GI bleed   . Paroxysmal atrial fibrillation     chronic anticoag; tikosyn  . Benign prostatic hypertrophy     history of  . Hepatic damage march 2009    retained hepatic stone , intrahepatic duct catheter  . Diabetes mellitus     insulin dep  . CAD (coronary artery disease)   . Hypertension   . 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 Seizures   . Ventricular tachycardia ? Tikosyn proarrhtyhmia   . Cataracts, bilateral     Past Surgical History  Procedure Laterality Date  . Cardioversion    . Cholecystectomy      history of  .  Partial gastrectomy      history of  . Shoulder surgery      left shoulder repair with 2 pens inserted  . Whipple procedure      operation  . Sarcoma removal from rigth tricep    . Inguinal hernia repair      inguinal herniorrhaphy, right... history of  . Appendectomy      history of  . Cardioversion  03/07/2012    Procedure: CARDIOVERSION;  Surgeon: Deboraha Sprang, MD;  Location: [facility] OR;  Service: Cardiovascular;  Laterality: N/A;    Current Outpatient Prescriptions  Medication Sig Dispense Refill  . amiodarone (PACERONE) 200 MG tablet Take 1 tablet (200 mg  total) by mouth for five  days a week as directed 65 tablet 3  . Ascorbic Acid (VITAMIN C) 500 MG tablet Take 500 mg by mouth daily.     . carvedilol (COREG) 3.125 MG tablet Take 1 tablet (3.125 mg total) by mouth 2 (two) times daily with a meal. 180 tablet 3  . [facility] GLUCOSAMINE-CHONDROITIN PO Take 1 tablet by mouth daily. 1200/600 mg    . ELIQUIS 2.5 MG TABS tablet Take 1 tablet by mouth two  times daily 180 tablet 1  . Emollient (AMLACTIN XL) LOTN Apply 1 application topically daily. Apply lotion to legs    .  finasteride (PROSCAR) 5 MG tablet Take 1 tablet (5 mg total) by mouth daily. 90 tablet 3  . Insulin Lispro, Human, (HUMALOG KWIKPEN) 200 UNIT/ML SOPN Inject 18 Units into the skin 3 (three) times daily with meals. (Patient taking differently: Inject 18 Units into the skin daily. Inject a total of 18 units of insulin into the skin daily. 3 times a day at different doses) 9 mL 3  . Insulin Pen Needle (B-D UF III MINI PEN NEEDLES) 31G X 5 MM MISC Inject 120 Devices into the skin 4 (four) times daily. Use a new needle for injecting insulin4 times a day 360 each 3  . LEVEMIR FLEXTOUCH 100 UNIT/ML Pen Inject 10 Units into the skin at bedtime.     . lipase/protease/amylase (CREON) 12000 UNITS CPEP capsule Take 1 capsule (12,000 Units total) by mouth 3 (three) times daily before meals. 270 capsule 3  . lisinopril (PRINIVIL,ZESTRIL) 2.5  MG tablet Take 1 tablet (2.5 mg total) by mouth daily. 90 tablet 3  . Multiple Vitamins-Minerals (CENTRUM SILVER PO) Take 1 tablet by mouth daily.     Marland Kitchen NOVOLOG FLEXPEN 100 UNIT/ML FlexPen     . Probiotic Product (PROBIOTIC DAILY PO) Take 3 tablets by mouth daily. 3 tabs once a day with meals.    Marland Kitchen SALINE NA Place 1 spray into both nostrils daily.    . tamsulosin (FLOMAX) 0.4 MG CAPS capsule Take 1 capsule (0.4 mg total) by mouth 2 (two) times daily. 180 capsule 3   No current facility-administered medications for this visit.    Allergies  Allergen Reactions  . Clindamycin Other (See Comments)    Reaction unknown  . Other Other (See Comments)    toujeo with perioral tingling only - pt refuses to take further  . Oxycodone-Acetaminophen Other (See Comments)    Reaction unknown  . Penicillins Hives    REACTION: causes hives    Review of Systems negative except from HPI and PMH  Physical Exam BP 104/58 mmHg  Pulse 60  Ht 6\' 2"  (1.88 m)  Wt 184 lb (83.462 kg)  BMI 23.61 kg/m2 Well developed and nourished in no acute distress HENT normal Neck supple with JVP-flat Clear Regular rate and rhythm, no murmurs or gallops Abd-soft with active BS No Clubbing cyanosis edema Skin-warm and dry A & Oriented  Grossly normal sensory and motor function   ECG demonstrates sinus rhythm at 61 intervals 21/09/43 Prior septal infarct Axis -46 No changes since 2012    Assessment and  Plan  Atrial fibrillation on amiodarone  History of retroperitoneal bleed  TSH elevation likely secondary to amiodarone  Ventricular tachycardia  No intercurrent Ventricular tachycardia   Spells of warmth question related to low blood pressure  There spells of warmth which may be related to low blood pressure as noted above. He may also be related to arrhythmia. I've instructed him in how to take his pulse. He will try to clarify his vitals with these spells.  There is some lightheadedness particularly  after eating as well as somnolence after meals. We will discontinue his carvedilol. His last ejection fraction was 50%.

## 2015-04-06 NOTE — Patient Instructions (Signed)
Medication Instructions:  Your physician has recommended you make the following change in your medication:  1) STOP Carvedilol  Labwork: Hepatic function panel today  Testing/Procedures: None  Follow-Up: Your physician wants you to follow-up in: 6 months with Chanetta Marshall, NP.  You will receive a reminder letter in the mail two months in advance. If you don't receive a letter, please call our office to schedule the follow-up appointment.   Thank you for choosing [facility]!!

## 2015-04-09 ENCOUNTER — Telehealth: Payer: Self-pay | Admitting: Internal Medicine

## 2015-04-09 NOTE — Telephone Encounter (Signed)
[facility] is supposed to be faxing in a request of refills for all of patients diabetic supplies and patient wants to be sure that it is enough for him test 4x a day.

## 2015-04-10 NOTE — Telephone Encounter (Signed)
Form received via fax, now pending signature. MD out of the office until 04/13/15.

## 2015-04-20 ENCOUNTER — Other Ambulatory Visit: Payer: Self-pay | Admitting: Internal Medicine

## 2015-04-20 ENCOUNTER — Encounter: Payer: Self-pay | Admitting: Internal Medicine

## 2015-04-20 DIAGNOSIS — E118 Type 2 diabetes mellitus with unspecified complications: Secondary | ICD-10-CM

## 2015-04-28 ENCOUNTER — Encounter: Payer: Self-pay | Admitting: Internal Medicine

## 2015-04-29 ENCOUNTER — Encounter: Payer: Self-pay | Admitting: Internal Medicine

## 2015-04-30 ENCOUNTER — Telehealth: Payer: Self-pay | Admitting: Internal Medicine

## 2015-04-30 ENCOUNTER — Encounter: Payer: Self-pay | Admitting: Internal Medicine

## 2015-04-30 NOTE — Telephone Encounter (Signed)
Form was faxed and sent to be scanned on 04/09/15. Will print off, refax again as well has mail out copy to patient.

## 2015-04-30 NOTE — Telephone Encounter (Signed)
Patient states [facility] had him to call to check on order for diabetic supplies.  Patient states that [facility] has faxed over form but has not received fax back.

## 2015-05-04 NOTE — Telephone Encounter (Signed)
Patient states he spoke with pharmacy today and they had not received fax again.

## 2015-05-04 NOTE — Telephone Encounter (Signed)
New one refaxed

## 2015-06-10 ENCOUNTER — Telehealth: Payer: Self-pay

## 2015-06-10 ENCOUNTER — Other Ambulatory Visit: Payer: Self-pay | Admitting: Internal Medicine

## 2015-06-10 DIAGNOSIS — N138 Other obstructive and reflux uropathy: Secondary | ICD-10-CM | POA: Insufficient documentation

## 2015-06-10 DIAGNOSIS — N401 Enlarged prostate with lower urinary tract symptoms: Principal | ICD-10-CM

## 2015-06-10 MED ORDER — FINASTERIDE 5 MG PO TABS: 5.0000 mg | ORAL_TABLET | Freq: Every day | ORAL | Status: AC

## 2015-06-10 MED ORDER — TAMSULOSIN HCL 0.4 MG PO CAPS: 0.4000 mg | ORAL_CAPSULE | Freq: Two times a day (BID) | ORAL | Status: AC

## 2015-06-10 NOTE — Telephone Encounter (Signed)
Please refill pt's Tamsulosin and Finasteride. I could not refill it because it needs to be attached to a diagnosis?

## 2015-06-10 NOTE — Telephone Encounter (Signed)
done

## 2015-06-24 ENCOUNTER — Ambulatory Visit (INDEPENDENT_AMBULATORY_CARE_PROVIDER_SITE_OTHER): Payer: Medicare Other | Admitting: Podiatry

## 2015-06-24 ENCOUNTER — Other Ambulatory Visit (INDEPENDENT_AMBULATORY_CARE_PROVIDER_SITE_OTHER): Payer: Medicare Other

## 2015-06-24 ENCOUNTER — Encounter: Payer: Self-pay | Admitting: Internal Medicine

## 2015-06-24 ENCOUNTER — Ambulatory Visit (INDEPENDENT_AMBULATORY_CARE_PROVIDER_SITE_OTHER): Payer: Medicare Other | Admitting: Internal Medicine

## 2015-06-24 ENCOUNTER — Encounter: Payer: Self-pay | Admitting: Podiatry

## 2015-06-24 VITALS — BP 120/66 | HR 60 | Temp 97.6°F | Resp 16 | Ht 74.0 in | Wt 185.2 lb

## 2015-06-24 VITALS — BP 109/54 | HR 60 | Resp 14

## 2015-06-24 DIAGNOSIS — E038 Other specified hypothyroidism: Secondary | ICD-10-CM

## 2015-06-24 DIAGNOSIS — G629 Polyneuropathy, unspecified: Secondary | ICD-10-CM

## 2015-06-24 DIAGNOSIS — K8689 Other specified diseases of pancreas: Secondary | ICD-10-CM

## 2015-06-24 DIAGNOSIS — D638 Anemia in other chronic diseases classified elsewhere: Secondary | ICD-10-CM | POA: Diagnosis not present

## 2015-06-24 DIAGNOSIS — E118 Type 2 diabetes mellitus with unspecified complications: Secondary | ICD-10-CM | POA: Diagnosis not present

## 2015-06-24 DIAGNOSIS — K868 Other specified diseases of pancreas: Secondary | ICD-10-CM

## 2015-06-24 DIAGNOSIS — M205X9 Other deformities of toe(s) (acquired), unspecified foot: Secondary | ICD-10-CM

## 2015-06-24 DIAGNOSIS — M2040 Other hammer toe(s) (acquired), unspecified foot: Secondary | ICD-10-CM

## 2015-06-24 DIAGNOSIS — J301 Allergic rhinitis due to pollen: Secondary | ICD-10-CM

## 2015-06-24 DIAGNOSIS — E1142 Type 2 diabetes mellitus with diabetic polyneuropathy: Secondary | ICD-10-CM

## 2015-06-24 LAB — CBC WITH DIFFERENTIAL/PLATELET
Basophils Absolute: 0 10*3/uL (ref 0.0–0.1)
Basophils Relative: 0.5 % (ref 0.0–3.0)
Eosinophils Absolute: 0.6 10*3/uL (ref 0.0–0.7)
Eosinophils Relative: 9.4 % — ABNORMAL HIGH (ref 0.0–5.0)
HCT: 35.3 % — ABNORMAL LOW (ref 39.0–52.0)
HEMOGLOBIN: 11.6 g/dL — AB (ref 13.0–17.0)
LYMPHS ABS: 1.5 10*3/uL (ref 0.7–4.0)
Lymphocytes Relative: 24 % (ref 12.0–46.0)
MCHC: 32.8 g/dL (ref 30.0–36.0)
MCV: 98.3 fl (ref 78.0–100.0)
Monocytes Absolute: 0.8 10*3/uL (ref 0.1–1.0)
Monocytes Relative: 13.2 % — ABNORMAL HIGH (ref 3.0–12.0)
Neutro Abs: 3.2 10*3/uL (ref 1.4–7.7)
Neutrophils Relative %: 52.9 % (ref 43.0–77.0)
PLATELETS: 168 10*3/uL (ref 150.0–400.0)
RBC: 3.6 Mil/uL — AB (ref 4.22–5.81)
RDW: 14.4 % (ref 11.5–15.5)
WBC: 6.1 10*3/uL (ref 4.0–10.5)

## 2015-06-24 LAB — BASIC METABOLIC PANEL
BUN: 20 mg/dL (ref 6–23)
CO2: 28 meq/L (ref 19–32)
Calcium: 8.9 mg/dL (ref 8.4–10.5)
Chloride: 105 mEq/L (ref 96–112)
Creatinine, Ser: 1.03 mg/dL (ref 0.40–1.50)
GFR: 72.42 mL/min (ref >60.00)
Glucose, Bld: 56 mg/dL — ABNORMAL LOW (ref 70–99)
Potassium: 4.6 mEq/L (ref 3.5–5.1)
Sodium: 137 mEq/L (ref 135–145)

## 2015-06-24 LAB — LIPASE: LIPASE: 10 U/L — AB (ref 11.0–59.0)

## 2015-06-24 LAB — HEMOGLOBIN A1C: Hgb A1c MFr Bld: 6 % (ref 4.6–6.5)

## 2015-06-24 LAB — TSH: TSH: 5.72 u[IU]/mL — AB (ref 0.35–4.50)

## 2015-06-24 MED ORDER — AZELASTINE HCL 0.1 % NA SOLN: 2.0000 | Freq: Two times a day (BID) | NASAL | Status: DC

## 2015-06-24 MED ORDER — LEVOTHYROXINE SODIUM 50 MCG PO TABS: 25.0000 ug | ORAL_TABLET | Freq: Every day | ORAL | Status: DC

## 2015-06-24 NOTE — Patient Instructions (Signed)
Our office will contact Dr. Ronnald Ramp for certification for diabetic shoes and will notify you when he responds and schedule a follow-up visit to measure your feet  Diabetes and Foot Care Diabetes may cause you to have problems because of poor blood supply (circulation) to your feet and legs. This may cause the skin on your feet to become thinner, break easier, and heal more slowly. Your skin may become dry, and the skin may peel and crack. You may also have nerve damage in your legs and feet causing decreased feeling in them. You may not notice minor injuries to your feet that could lead to infections or more serious problems. Taking care of your feet is one of the most important things you can do for yourself.  HOME CARE INSTRUCTIONS  Wear shoes at all times, even in the house. Do not go barefoot. Bare feet are easily injured.  Check your feet daily for blisters, cuts, and redness. If you cannot see the bottom of your feet, use a mirror or ask someone for help.  Wash your feet with warm water (do not use hot water) and mild soap. Then pat your feet and the areas between your toes until they are completely dry. Do not soak your feet as this can dry your skin.  Apply a moisturizing lotion or petroleum jelly (that does not contain alcohol and is unscented) to the skin on your feet and to dry, brittle toenails. Do not apply lotion between your toes.  Trim your toenails straight across. Do not dig under them or around the cuticle. File the edges of your nails with an emery board or nail file.  Do not cut corns or calluses or try to remove them with medicine.  Wear clean socks or stockings every day. Make sure they are not too tight. Do not wear knee-high stockings since they may decrease blood flow to your legs.  Wear shoes that fit properly and have enough cushioning. To break in new shoes, wear them for just a few hours a day. This prevents you from injuring your feet. Always look in your shoes before  you put them on to be sure there are no objects inside.  Do not cross your legs. This may decrease the blood flow to your feet.  If you find a minor scrape, cut, or break in the skin on your feet, keep it and the skin around it clean and dry. These areas may be cleansed with mild soap and water. Do not cleanse the area with peroxide, alcohol, or iodine.  When you remove an adhesive bandage, be sure not to damage the skin around it.  If you have a wound, look at it several times a day to make sure it is healing.  Do not use heating pads or hot water bottles. They may burn your skin. If you have lost feeling in your feet or legs, you may not know it is happening until it is too late.  Make sure your health care provider performs a complete foot exam at least annually or more often if you have foot problems. Report any cuts, sores, or bruises to your health care provider immediately. SEEK MEDICAL CARE IF:   You have an injury that is not healing.  You have cuts or breaks in the skin.  You have an ingrown nail.  You notice redness on your legs or feet.  You feel burning or tingling in your legs or feet.  You have pain or cramps in  your legs and feet.  Your legs or feet are numb.  Your feet always feel cold. SEEK IMMEDIATE MEDICAL CARE IF:   There is increasing redness, swelling, or pain in or around a wound.  There is a red line that goes up your leg.  Pus is coming from a wound.  You develop a fever or as directed by your health care provider.  You notice a bad smell coming from an ulcer or wound. Document Released: 11/11/2000 Document Revised: 07/17/2013 Document Reviewed: 04/23/2013 [facility] Patient Information 2015 [place], [place]. This information is not intended to replace advice given to you by your health care provider. Make sure you discuss any questions you have with your health care provider.

## 2015-06-24 NOTE — Progress Notes (Signed)
Pre visit review using our clinic review tool, if applicable. No additional management support is needed unless otherwise documented below in the visit note. 

## 2015-06-24 NOTE — Patient Instructions (Signed)
Hypothyroidism The thyroid is a large gland located in the lower front of your neck. The thyroid gland helps control metabolism. Metabolism is how your body handles food. It controls metabolism with the hormone thyroxine. When this gland is underactive (hypothyroid), it produces too little hormone.  CAUSES These include:   Absence or destruction of thyroid tissue.  Goiter due to iodine deficiency.  Goiter due to medications.  Congenital defects (since birth).  Problems with the pituitary. This causes a lack of TSH (thyroid stimulating hormone). This hormone tells the thyroid to turn out more hormone. SYMPTOMS  Lethargy (feeling as though you have no energy)  Cold intolerance  Weight gain (in spite of normal food intake)  Dry skin  Coarse hair  Menstrual irregularity (if severe, may lead to infertility)  Slowing of thought processes Cardiac problems are also caused by insufficient amounts of thyroid hormone. Hypothyroidism in the newborn is cretinism, and is an extreme form. It is important that this form be treated adequately and immediately or it will lead rapidly to retarded physical and mental development. DIAGNOSIS  To prove hypothyroidism, your caregiver may do blood tests and ultrasound tests. Sometimes the signs are hidden. It may be necessary for your caregiver to watch this illness with blood tests either before or after diagnosis and treatment. TREATMENT  Low levels of thyroid hormone are increased by using synthetic thyroid hormone. This is a safe, effective treatment. It usually takes about four weeks to gain the full effects of the medication. After you have the full effect of the medication, it will generally take another four weeks for problems to leave. Your caregiver may start you on low doses. If you have had heart problems the dose may be gradually increased. It is generally not an emergency to get rapidly to normal. HOME CARE INSTRUCTIONS   Take your  medications as your caregiver suggests. Let your caregiver know of any medications you are taking or start taking. Your caregiver will help you with dosage schedules.  As your condition improves, your dosage needs may increase. It will be necessary to have continuing blood tests as suggested by your caregiver.  Report all suspected medication side effects to your caregiver. SEEK MEDICAL CARE IF: Seek medical care if you develop:  Sweating.  Tremulousness (tremors).  Anxiety.  Rapid weight loss.  Heat intolerance.  Emotional swings.  Diarrhea.  Weakness. SEEK IMMEDIATE MEDICAL CARE IF:  You develop chest pain, an irregular heart beat (palpitations), or a rapid heart beat. MAKE SURE YOU:   Understand these instructions.  Will watch your condition.  Will get help right away if you are not doing well or get worse. Document Released: 11/14/2005 Document Revised: 02/06/2012 Document Reviewed: 07/04/2008 [employer] Patient Information 2015 [employer]. This information is not intended to replace advice given to you by your health care provider. Make sure you discuss any questions you have with your health care provider.  

## 2015-06-24 NOTE — Progress Notes (Signed)
Subjective:  Patient ID: [name], male    DOB: 01-Nov-1925  Age: 81 y.o. MRN: [id number]  CC: Hypothyroidism; Diabetes; and Allergic Rhinitis    HPI [name] presents for follow-up on insulin-dependent diabetes. He complains of an episode of hypoglycemia about once every 1-2 weeks. Otherwise his blood sugars are very well controlled. He complains of persistent allergy symptoms with runny nose, sneezing, postnasal drip, and congestion.  Outpatient Prescriptions Prior to Visit  Medication Sig Dispense Refill  . amiodarone (PACERONE) 200 MG tablet Take 1 tablet (200 mg  total) by mouth for five  days a week as directed 65 tablet 3  . Ascorbic Acid (VITAMIN C) 500 MG tablet Take 500 mg by mouth daily.     . carvedilol (COREG) 3.125 MG tablet Take 1 tablet (3.125 mg total) by mouth 2 (two) times daily with a meal. 180 tablet 3  . [facility] GLUCOSAMINE-CHONDROITIN PO Take 1 tablet by mouth daily. 1200/600 mg    . ELIQUIS 2.5 MG TABS tablet Take 1 tablet by mouth two  times daily 180 tablet 1  . Emollient (AMLACTIN XL) LOTN Apply 1 application topically daily. Apply lotion to legs    . finasteride (PROSCAR) 5 MG tablet Take 1 tablet (5 mg total) by mouth daily. 90 tablet 3  . Insulin Lispro, Human, (HUMALOG KWIKPEN) 200 UNIT/ML SOPN Inject 18 Units into the skin 3 (three) times daily with meals. (Patient taking differently: Inject 18 Units into the skin daily. Inject 6 units, 4 units, 8 units a total of 18 units of insulin into the skin daily. 3 times a day at different times.) 9 mL 3  . Insulin Pen Needle (B-D UF III MINI PEN NEEDLES) 31G X 5 MM MISC Inject 120 Devices into the skin 4 (four) times daily. Use a new needle for injecting insulin4 times a day 360 each 3  . lipase/protease/amylase (CREON) 12000 UNITS CPEP capsule Take 1 capsule (12,000 Units total) by mouth 3 (three) times daily before meals. 270 capsule 3  . lisinopril (PRINIVIL,ZESTRIL) 2.5 MG tablet Take 1 tablet (2.5 mg total)  by mouth daily. 90 tablet 3  . Multiple Vitamins-Minerals (CENTRUM SILVER PO) Take 1 tablet by mouth daily.     . Probiotic Product (PROBIOTIC DAILY PO) Take 3 tablets by mouth daily. 3 tabs once a day with meals.    Marland Kitchen SALINE NA Place 1 spray into both nostrils daily.    . tamsulosin (FLOMAX) 0.4 MG CAPS capsule Take 1 capsule (0.4 mg total) by mouth 2 (two) times daily. 180 capsule 3  . LEVEMIR FLEXTOUCH 100 UNIT/ML Pen Inject 10 Units into the skin at bedtime.     Marland Kitchen NOVOLOG FLEXPEN 100 UNIT/ML FlexPen      No facility-administered medications prior to visit.    ROS Review of Systems  Constitutional: Positive for fatigue. Negative for fever, chills, diaphoresis, activity change, appetite change and unexpected weight change.  HENT: Positive for congestion, postnasal drip and rhinorrhea. Negative for drooling, ear pain, sinus pressure, sore throat, tinnitus and trouble swallowing.   Eyes: Negative.   Respiratory: Negative.  Negative for cough, choking, chest tightness, shortness of breath and stridor.   Cardiovascular: Negative.  Negative for chest pain, palpitations and leg swelling.  Gastrointestinal: Negative.  Negative for nausea, vomiting, abdominal pain, diarrhea, constipation and blood in stool.  Endocrine: Negative.  Negative for polydipsia, polyphagia and polyuria.  Genitourinary: Negative.  Negative for difficulty urinating.  Musculoskeletal: Negative.  Negative for myalgias,  back pain and arthralgias.  Skin: Negative.  Negative for rash.  Allergic/Immunologic: Negative.   Neurological: Negative.  Negative for dizziness, syncope, weakness, light-headedness and numbness.  Hematological: Negative.   Psychiatric/Behavioral: Negative.     Objective:  BP 120/66 mmHg  Pulse 60  Temp(Src) 97.6 F (36.4 C) (Oral)  Resp 16  Ht 6\' 2"  (1.88 m)  Wt 185 lb 4 oz (84.029 kg)  BMI 23.77 kg/m2  SpO2 95%  BP Readings from Last 3 Encounters:  06/24/15 120/66  06/24/15 109/54    04/06/15 104/58    Wt Readings from Last 3 Encounters:  06/24/15 185 lb 4 oz (84.029 kg)  04/06/15 184 lb (83.462 kg)  02/25/15 184 lb 8 oz (83.689 kg)    Physical Exam  Constitutional: He is oriented to person, place, and time.  Non-toxic appearance. He does not have a sickly appearance. He does not appear ill. No distress.  HENT:  Nose: Mucosal edema and rhinorrhea present. No sinus tenderness, nasal deformity or septal deviation. Right sinus exhibits no maxillary sinus tenderness and no frontal sinus tenderness. Left sinus exhibits no maxillary sinus tenderness and no frontal sinus tenderness.  Mouth/Throat: Oropharynx is clear and moist. No oropharyngeal exudate.  Eyes: Right eye exhibits no discharge. Left eye exhibits no discharge. No scleral icterus.  Neck: Normal range of motion. Neck supple. No JVD present. No tracheal deviation present. No thyromegaly present.  Cardiovascular: Normal rate, regular rhythm, normal heart sounds and intact distal pulses.  Exam reveals no gallop and no friction rub.   No murmur heard. Pulmonary/Chest: Effort normal and breath sounds normal. No stridor. No respiratory distress. He has no wheezes. He has no rales. He exhibits no tenderness.  Abdominal: Soft. Bowel sounds are normal. He exhibits no distension and no mass. There is no tenderness. There is no rebound and no guarding.  Musculoskeletal: Normal range of motion. He exhibits no edema or tenderness.  Lymphadenopathy:    He has no cervical adenopathy.  Neurological: He is oriented to person, place, and time.  Skin: Skin is warm and dry. No rash noted. He is not diaphoretic. No erythema. No pallor.    Lab Results  Component Value Date   WBC 6.1 06/24/2015   HGB 11.6* 06/24/2015   HCT 35.3* 06/24/2015   PLT 168.0 06/24/2015   GLUCOSE 56* 06/24/2015   CHOL 129 10/27/2014   TRIG 67.0 10/27/2014   HDL 39.20 10/27/2014   LDLCALC 76 10/27/2014   ALT 22 04/06/2015   AST 27 04/06/2015   NA  137 06/24/2015   K 4.6 06/24/2015   CL 105 06/24/2015   CREATININE 1.03 06/24/2015   BUN 20 06/24/2015   CO2 28 06/24/2015   TSH 5.72* 06/24/2015   PSA 2.28 01/04/2010   INR 1.41 07/07/2013   HGBA1C 6.0 06/24/2015   MICROALBUR <0.7 02/25/2015    Ct Abdomen Wo Contrast  11/12/2014   CLINICAL DATA:  Followup retroperitoneal hematoma/nodule. History of prior Whipple procedure 1998.  EXAM: CT ABDOMEN WITHOUT CONTRAST  TECHNIQUE: Multidetector CT imaging of the abdomen was performed following the standard protocol without IV contrast.  COMPARISON:  09/27/2013  FINDINGS: Further resolution and decrease in the size of the left retroperitoneal hematoma. Small lenticular shaped structure persists in the left retroperitoneum anterior to the left kidney, measuring 2.1 x 1.4 cm compared with 6.0 x 4.4 cm previously.  Changes of prior Whipple procedure. Pneumobilia noted. No focal hepatic lesion. Spleen, adrenals and kidneys have an unremarkable unenhanced appearance. Previously  seen left adrenal nodule no longer visualized. Visualized large and small bowel unremarkable.  Aorta is calcified, non aneurysmal. No acute bony abnormality or focal bone lesion.  Trace right pleural effusion. Minimal atelectasis in the lung bases bilaterally. Coronary artery and valvular calcifications within the heart. Heart is normal size.  IMPRESSION: Further decrease in size of the complex fluid collection in the left retroperitoneum compatible with resolving retroperitoneal hematoma. This currently measures 2.1 x 1.4 cm.  Previously seen left adrenal nodule no longer visualized.  Prior Whipple procedure.  Stable pneumobilia.  Trace right pleural effusion.  Bibasilar atelectasis.   Electronically Signed   By: Rolm Baptise M.D.   On: 11/12/2014 13:55    Assessment & Plan:   Gehrig was seen today for hypothyroidism and diabetes.  Diagnoses and all orders for this visit:  Type II diabetes mellitus with manifestations- his A1c is  down to 6% and he has a few documented episodes of hypoglycemia. I have advised him to discontinue the Levemir. Orders: -     Basic metabolic panel; Future -     Hemoglobin A1c; Future  Other specified hypothyroidism- his TSH is slightly elevated, he is on a amiodarone and complains of fatigue, will start low-dose levothyroxine replacement. Orders: -     TSH; Future  Anemia of chronic disease- this is stable Orders: -     CBC with Differential/Platelet; Future  Pancreatic insufficiency- his lipase remains low, will check his stool for elastase level. Orders: -     Lipase; Future -     Pancreatic elastase, fecal; Future  Allergic rhinitis due to pollen- will add Astelin to his other treatment options for additional symptom relief. Orders: -     azelastine (ASTELIN) 0.1 % nasal spray; Place 2 sprays into both nostrils 2 (two) times daily. Use in each nostril as directed   I have discontinued [name] NOVOLOG FLEXPEN and Moran. I am also having him start on azelastine and levothyroxine. Additionally, I am having him maintain his AMLACTIN XL, Multiple Vitamins-Minerals (CENTRUM SILVER PO), vitamin C, [facility]S GLUCOSAMINE-CHONDROITIN PO, Probiotic Product (PROBIOTIC DAILY PO), SALINE NA, carvedilol, lisinopril, lipase/protease/amylase, Insulin Lispro (Human), amiodarone, ELIQUIS, Insulin Pen Needle, tamsulosin, and finasteride.  Meds ordered this encounter  Medications  . azelastine (ASTELIN) 0.1 % nasal spray    Sig: Place 2 sprays into both nostrils 2 (two) times daily. Use in each nostril as directed    Dispense:  30 mL    Refill:  11  . levothyroxine (SYNTHROID, LEVOTHROID) 50 MCG tablet    Sig: Take 0.5 tablets (25 mcg total) by mouth daily.    Dispense:  45 tablet    Refill:  1     Follow-up: Return in about 4 months (around 10/25/2015).  [name], MD

## 2015-06-24 NOTE — Progress Notes (Signed)
   Subjective:    Patient ID: [name], male    DOB: 1925/10/03, 81 y.o.   MRN: [id number]  HPI  This patient presents today for yearly diabetic foot examination with a history of previous burning and neuropathy. The symptoms have not progressed over time. Also, patient is requesting a replacement diabetic shoes. Patient denies any history of ulceration, claudication or amputation   Review of Systems  Neurological:       Occasional feeling of pins and needles in both feet and ankles       Objective:   Physical Exam Orientated 3  Vascular: Peripheral pitting edema bilaterally DP pulses 2/4 right and 0/4 left PT pulses 1/4 bilaterally Capillary reflex immediate bilaterally  Neurological: Sensation to 10 g monofilament wire intact 5/5 bilaterally Vibratory sensation nonreactive bilaterally Ankle reflexes reactive bilaterally  Dermatological: No open skin lesions noted bilaterally  Musculoskeletal: Hammertoe deformities fourth bilaterally Bunionette's bilaterally Is no restriction ankle, subtalar, midtarsal joints bilaterally       Assessment & Plan:   Assessment: Decrease pedal pulses suggestive of diabetic peripheral arterial disease Diabetic peripheral neuropathy Bunionette's bilaterally  Plan: I reviewed the results of patient's diabetic foot examination today. His neuropathy seems to be stable and no additional treatment recommended Obtain certification for diabetic shoes for the indication of: Type II diabetic with neuropathy Hammertoes fourth bilaterally Other acquired deformities of the foot (bunionette bilaterally) Type 2 diabetes with diabetic polyneuropathy Loss of vibratory sensation Diminished posterior tibial pulses bilaterally  Obtain certification for Dr. [name] and he contact patient when certification is obtained

## 2015-06-25 ENCOUNTER — Other Ambulatory Visit: Payer: Medicare Other

## 2015-06-25 ENCOUNTER — Encounter: Payer: Self-pay | Admitting: Internal Medicine

## 2015-06-25 DIAGNOSIS — K8689 Other specified diseases of pancreas: Secondary | ICD-10-CM

## 2015-07-04 ENCOUNTER — Encounter: Payer: Self-pay | Admitting: Internal Medicine

## 2015-07-04 LAB — PANCREATIC ELASTASE, FECAL

## 2015-07-07 ENCOUNTER — Encounter: Payer: Self-pay | Admitting: Internal Medicine

## 2015-07-27 ENCOUNTER — Telehealth: Payer: Self-pay | Admitting: Internal Medicine

## 2015-07-27 ENCOUNTER — Encounter: Payer: Self-pay | Admitting: Internal Medicine

## 2015-07-27 NOTE — Telephone Encounter (Signed)
New message     Pt states he thinks he is in Afib and heart rate is 115 Pt states heart rate is double his normal range Pt is on amiodarone Pt states no other symptoms but pt is weak

## 2015-07-27 NOTE — Telephone Encounter (Signed)
Called patient back about his symptoms. Patient stated he went into A. Fib this morning and his heart rate is 115. Patient stated this is the first time this has happen since being on Amiodarone. Patient is also having some weakness. Patient is taking Eliquis 2.5 mg, Amiodarone 200 mg , and Coreg 3.125 mg. Consulted [employer] PA, he advised for patient to take an extra Coreg 3.125 mg now, and this evening take Coreg 6.25 mg (2 tabs). Also,  Patient should be seen in A. Fib clinic. Patient verbalized understanding and has appointment with Roderic Palau NP tomorrow. Advised patient if symptoms get worse go to ED.

## 2015-07-27 NOTE — Telephone Encounter (Signed)
This message I think sent to me in error

## 2015-07-28 ENCOUNTER — Ambulatory Visit: Payer: Medicare Other

## 2015-07-28 ENCOUNTER — Encounter (HOSPITAL_COMMUNITY): Payer: Self-pay | Admitting: Nurse Practitioner

## 2015-07-28 ENCOUNTER — Ambulatory Visit (HOSPITAL_COMMUNITY)
Admission: RE | Admit: 2015-07-28 | Discharge: 2015-07-28 | Disposition: A | Discharge status: Home / Self Care | Payer: Medicare Other | Source: Ambulatory Visit | Attending: Nurse Practitioner | Admitting: Nurse Practitioner

## 2015-07-28 VITALS — BP 90/50 | HR 99 | Ht 74.0 in | Wt 182.6 lb

## 2015-07-28 DIAGNOSIS — I4892 Unspecified atrial flutter: Secondary | ICD-10-CM | POA: Diagnosis present

## 2015-07-28 NOTE — Progress Notes (Signed)
Patient ID: [name], male   DOB: Mar 31, 1925, 81 y.o.   MRN: [id number]     Primary Care Physician: Casey Calico, Hoffman Referring Physician: Theodoro Hoffman street triage Electrophysiologist: [name] is a 81 y.o. male with a h/o PAF that previously took [name] but has been maintaining SR on amiodarone for the last 3 years. He starting taking levothyroxine 8 days ago for hypothyroidism and yesterday developed a fast irregular heart beat. He took two doses of extra coreg and this am BP slightly  low around 90 systolic. He measured HR as fast as 115 yesterday. This am ekg suggests aflutter at 99 bpm. He takes amiodarone 200 mg 5 days and has taken the drug this way from initiation.  Today, he denies symptoms of chest pain, shortness of breath, orthopnea, PND, lower extremity edema, dizziness, presyncope, syncope, or neurologic sequela. He c/o mild shortness of breath and weakness. The patient is tolerating medications without difficulties and is otherwise without complaint today.   Past Medical History  Diagnosis Date  . Acid reflux disease     history of  . H/O: GI bleed   . Paroxysmal atrial fibrillation     chronic anticoag; tikosyn  . Benign prostatic hypertrophy     history of  . Hepatic damage march 2009    retained hepatic stone , intrahepatic duct catheter  . Diabetes mellitus     insulin dep  . CAD (coronary artery disease)   . Hypertension   . 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 Seizures   . Ventricular tachycardia ? Tikosyn proarrhtyhmia   . Cataracts, bilateral    Past Surgical History  Procedure Laterality Date  . Cardioversion    . Cholecystectomy      history of  . Partial gastrectomy      history of  . Shoulder surgery      left shoulder repair with 2 pens inserted  . Whipple procedure      operation  .  Sarcoma removal from rigth tricep    . Inguinal hernia repair      inguinal herniorrhaphy, right... history of  . Appendectomy      history of  . Cardioversion  03/07/2012    Procedure: CARDIOVERSION;  Surgeon: [name];  Location: [name];  Service: Cardiovascular;  Laterality: N/A;    Current Outpatient Prescriptions  Medication Sig Dispense Refill  . amiodarone (PACERONE) 200 MG tablet Take 1 tablet (200 mg  total) by mouth for five  days a week as directed 65 tablet 3  . Ascorbic Acid (VITAMIN C) 500 MG tablet Take 500 mg by mouth daily.     . carvedilol (COREG) 3.125 MG tablet Take 1 tablet (3.125 mg total) by mouth 2 (two) times daily with a meal. 180 tablet 3  . [facility]S GLUCOSAMINE-CHONDROITIN PO Take 1 tablet by mouth daily. 1200/600 mg    . ELIQUIS 2.5 MG TABS tablet Take 1 tablet by mouth two  times daily 180 tablet 1  . Emollient (AMLACTIN XL) LOTN Apply 1 application topically daily. Apply lotion to legs    . finasteride (PROSCAR) 5 MG tablet Take 1 tablet (5 mg total) by mouth daily. 90 tablet 3  . Insulin Lispro, Human, (HUMALOG KWIKPEN) 200 UNIT/ML SOPN Inject 18 Units into the skin  3 (three) times daily with meals. (Patient taking differently: Inject 18 Units into the skin daily. Inject 6 units, 4 units, 8 units a total of 18 units of insulin into the skin daily. 3 times a day at different times.) 9 mL 3  . Insulin Pen Needle (B-D UF III MINI PEN NEEDLES) 31G X 5 MM MISC Inject 120 Devices into the skin 4 (four) times daily. Use a new needle for injecting insulin4 times a day 360 each 3  . levothyroxine (SYNTHROID, LEVOTHROID) 50 MCG tablet Take 0.5 tablets (25 mcg total) by mouth daily. 45 tablet 1  . lipase/protease/amylase (CREON) 12000 UNITS CPEP capsule Take 1 capsule (12,000 Units total) by mouth 3 (three) times daily before meals. 270 capsule 3  . lisinopril (PRINIVIL,ZESTRIL) 2.5 MG tablet Take 1 tablet (2.5 mg total) by mouth daily. 90 tablet 3  . Multiple  Vitamins-Minerals (CENTRUM SILVER PO) Take 1 tablet by mouth daily.     . Probiotic Product (PROBIOTIC DAILY PO) Take 3 tablets by mouth daily. 3 tabs once a day with meals.    Marland Kitchen SALINE NA Place 1 spray into both nostrils daily.    . tamsulosin (FLOMAX) 0.4 MG CAPS capsule Take 1 capsule (0.4 mg total) by mouth 2 (two) times daily. 180 capsule 3  . azelastine (ASTELIN) 0.1 % nasal spray Place 2 sprays into both nostrils 2 (two) times daily. Use in each nostril as directed (Patient not taking: Reported on 07/28/2015) 30 mL 11   No current facility-administered medications for this encounter.    Allergies  Allergen Reactions  . Clindamycin Other (See Comments)    Reaction unknown  . Other Other (See Comments)    toujeo with perioral tingling only - pt refuses to take further  . Oxycodone-Acetaminophen Other (See Comments)    Reaction unknown  . Penicillins Hives    REACTION: causes hives    Social History   Social History  . Marital Status: Married    Spouse Name: N/A  . Number of Children: 3  . Years of Education: N/A   Occupational History  . 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     Social History Main Topics  . Smoking status: Former Smoker    Types: Pipe    Quit date: 03/14/1991  . Smokeless tobacco: Never Used  . Alcohol Use: No  . Drug Use: No  . Sexual Activity: Not Currently   Other Topics Concern  . Not on file   Social History Narrative   [employer] -IT sales professional. married 1954. 2 sons - [place]; 1 daughter 68. 7 grandchildren. retired- Training and development officer.  End of Life Care: DNR, no prolonged intubation (more than 2-3 days), no sustaining tube feeding, no futile or heroic measures. (Provided signed Out of Facility order and unsigned MOST form 6/112/12)    Family History  Problem Relation Age of Onset  . Coronary artery disease Father 81  . Heart disease Father     fatal MI  . Osteoarthritis Mother 67  . Arthritis Mother   . Coronary artery  disease Brother     cabg, avr, pvd with sents  . Heart disease Brother     CABG, PVD  . Peripheral vascular disease Sister 63  . Fibromyalgia Sister   . Arthritis Sister   . Coronary artery disease Sister   . Diabetes Sister   . Cancer Brother     ROS- All systems are reviewed and negative except  as per the HPI above  Physical Exam: Filed Vitals:   07/28/15 0902  BP: 90/50  Pulse: 99  Height: 6\' 2"  (1.88 m)  Weight: 182 lb 9.6 oz (82.827 kg)    GEN- The patient is well appearing, alert and oriented x 3 today.   Head- normocephalic, atraumatic Eyes-  Sclera clear, conjunctiva pink Ears- hearing intact Oropharynx- clear Neck- supple, no JVP Lymph- no cervical lymphadenopathy Lungs- Clear to ausculation bilaterally, normal work of breathing Heart- Irregular rate and rhythm, no murmurs, rubs or gallops, PMI not laterally displaced GI- soft, NT, ND, + BS Extremities- no clubbing, cyanosis, or edema MS- no significant deformity or atrophy Skin- no rash or lesion Psych- euthymic mood, full affect Neuro- strength and sensation are intact  EKG- Aflutter 99 bpm with variable block..QS int 100 ms, QTc 490 ms Epic records reviewed  Assessment and Plan: 1. Afib/flutter Possibly triggered by levothyroxine Pt did speak to pcp and the plan is to stop HRT . Discussed with Dr. Rayann Heman and he suggest to wait for one week to see if HR will return to SR off levothyroxine  Will see back in one week. In the interim, he can use an extra 3.25 of coreg for HR over 233 and systolic BP over 435. Continue on amiodarone 200 mg every day for now  Continue eliquis without missed doses If continues in afib, consider increase of amiodarone short term vrs DCCV.   [name], [place] Hoffman [address] [place], [place] [address] [phone]

## 2015-07-29 ENCOUNTER — Encounter: Payer: Self-pay | Admitting: Internal Medicine

## 2015-07-30 ENCOUNTER — Encounter (HOSPITAL_COMMUNITY): Payer: Self-pay | Admitting: Nurse Practitioner

## 2015-08-04 ENCOUNTER — Ambulatory Visit: Payer: Medicare Other | Admitting: *Deleted

## 2015-08-04 DIAGNOSIS — E1142 Type 2 diabetes mellitus with diabetic polyneuropathy: Secondary | ICD-10-CM

## 2015-08-05 NOTE — Progress Notes (Signed)
Patient ID: [name], male   DOB: [birth year], 81 y.o.   MRN: [id number] Patient presents for shoe measurement and orthotic scanning

## 2015-08-06 ENCOUNTER — Ambulatory Visit (HOSPITAL_COMMUNITY)
Admission: RE | Admit: 2015-08-06 | Discharge: 2015-08-06 | Disposition: A | Discharge status: Home / Self Care | Payer: Medicare Other | Source: Ambulatory Visit | Attending: Nurse Practitioner | Admitting: Nurse Practitioner

## 2015-08-06 ENCOUNTER — Encounter: Payer: Self-pay | Admitting: Internal Medicine

## 2015-08-06 VITALS — BP 118/70 | HR 112 | Ht 74.0 in | Wt 185.4 lb

## 2015-08-06 DIAGNOSIS — I481 Persistent atrial fibrillation: Secondary | ICD-10-CM | POA: Insufficient documentation

## 2015-08-06 DIAGNOSIS — I4819 Other persistent atrial fibrillation: Secondary | ICD-10-CM

## 2015-08-06 LAB — CBC
HCT: 36.1 % — ABNORMAL LOW (ref 39.0–52.0)
HEMOGLOBIN: 11.9 g/dL — AB (ref 13.0–17.0)
MCH: 32.7 pg (ref 26.0–34.0)
MCHC: 33 g/dL (ref 30.0–36.0)
MCV: 99.2 fL (ref 78.0–100.0)
Platelets: 147 10*3/uL — ABNORMAL LOW (ref 150–400)
RBC: 3.64 MIL/uL — ABNORMAL LOW (ref 4.22–5.81)
RDW: 13.5 % (ref 11.5–15.5)
WBC: 5.5 10*3/uL (ref 4.0–10.5)

## 2015-08-06 LAB — BASIC METABOLIC PANEL
ANION GAP: 3 — AB (ref 5–15)
BUN: 20 mg/dL (ref 6–20)
CALCIUM: 8.6 mg/dL — AB (ref 8.9–10.3)
CHLORIDE: 105 mmol/L (ref 101–111)
CO2: 29 mmol/L (ref 22–32)
CREATININE: 1.25 mg/dL — AB (ref 0.61–1.24)
GFR calc non Af Amer: 50 mL/min — ABNORMAL LOW (ref >60)
GFR, EST AFRICAN AMERICAN: 57 mL/min — AB (ref >60)
Glucose, Bld: 96 mg/dL (ref 65–99)
Potassium: 4.7 mmol/L (ref 3.5–5.1)
SODIUM: 137 mmol/L (ref 135–145)

## 2015-08-06 NOTE — Patient Instructions (Signed)
Cardioversion scheduled for Monday, September 12th  - Arrive at the [facility] and go to admitting at 12:00pm  -Do not eat or drink anything after midnight the night prior to your procedure.  - Take all your medication with a sip of water prior to arrival.  - You will not be able to drive home after your procedure.  Follow up appointment in 1 week -- parking code [phone]

## 2015-08-07 ENCOUNTER — Encounter (HOSPITAL_COMMUNITY): Payer: Self-pay | Admitting: Nurse Practitioner

## 2015-08-07 NOTE — Addendum Note (Signed)
Encounter addended by: Sherran Needs, NP on: 08/07/2015  8:31 AM<BR>     Documentation filed: Notes Section

## 2015-08-07 NOTE — Progress Notes (Addendum)
Patient ID: [name], male   DOB: Sep 01, 1925, 81 y.o.   MRN: [id number]     Primary Care Physician: [name], MD Referring Physician: Theodoro Kos street triage Electrophysiologist: Dr. Vena Austria is a 81 y.o. male with a h/o PAF that previously took [name] but has been maintaining SR on amiodarone for the last 3 years. He starting taking levothyroxine 8 days ago for hypothyroidism and yesterday developed a fast irregular heart beat. He took two doses of extra coreg and this am BP slightly  low around 90 systolic. He measured HR as fast as 115 yesterday. This am ekg suggests aflutter at 99 bpm. He takes amiodarone 200 mg 5 days and has taken the drug this way from initiation. He was asked to increase amiodarone to qd and he stopped levothyroxine with PCP agreement.     Unfortunately, he returns today to the afib clinic and continues in afib. He has required DCCV x 2 in the past and has failed tikosyn. He would like to pursue DCCV. Has not missed any doses of DOAC.  Today, he denies symptoms of chest pain, shortness of breath, orthopnea, PND, lower extremity edema, dizziness, presyncope, syncope, or neurologic sequela. He c/o mild shortness of breath and weakness with afib.. The patient is tolerating medications without difficulties and is otherwise without complaint today.   Past Medical History  Diagnosis Date  . Acid reflux disease     history of  . H/O: GI bleed   . Paroxysmal atrial fibrillation     chronic anticoag; tikosyn  . Benign prostatic hypertrophy     history of  . Hepatic damage march 2009    retained hepatic stone , intrahepatic duct catheter  . Diabetes mellitus     insulin dep  . CAD (coronary artery disease)   . Hypertension   . 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 Seizures   . Ventricular tachycardia ?  Tikosyn proarrhtyhmia   . Cataracts, bilateral    Past Surgical History  Procedure Laterality Date  . Cardioversion    . Cholecystectomy      history of  . Partial gastrectomy      history of  . Shoulder surgery      left shoulder repair with 2 pens inserted  . Whipple procedure      operation  . Sarcoma removal from rigth tricep    . Inguinal hernia repair      inguinal herniorrhaphy, right... history of  . Appendectomy      history of  . Cardioversion  03/07/2012    Procedure: CARDIOVERSION;  Surgeon: Deboraha Sprang, MD;  [facility];  Service: Cardiovascular;  Laterality: N/A;    Current Outpatient Prescriptions  Medication Sig Dispense Refill  . amiodarone (PACERONE) 200 MG tablet Take 1 tablet (200 mg  total) by mouth for five  days a week as directed 65 tablet 3  . Ascorbic Acid (VITAMIN C) 500 MG tablet Take 500 mg by mouth daily.     . carvedilol (COREG) 3.125 MG tablet Take 1 tablet (3.125 mg total) by mouth 2 (two) times daily with a meal. 180 tablet 3  . CVS GLUCOSAMINE-CHONDROITIN PO Take 1 tablet by mouth daily. 1200/600 mg    . ELIQUIS 2.5 MG TABS tablet Take 1 tablet by mouth  two  times daily 180 tablet 1  . Emollient (AMLACTIN XL) LOTN Apply 1 application topically daily. Apply lotion to legs    . finasteride (PROSCAR) 5 MG tablet Take 1 tablet (5 mg total) by mouth daily. 90 tablet 3  . Insulin Lispro, Human, (HUMALOG KWIKPEN) 200 UNIT/ML SOPN Inject 18 Units into the skin 3 (three) times daily with meals. (Patient taking differently: Inject 18 Units into the skin daily. Inject 6 units, 4 units, 8 units a total of 18 units of insulin into the skin daily. 3 times a day at different times.) 9 mL 3  . Insulin Pen Needle (B-D UF III MINI PEN NEEDLES) 31G X 5 MM MISC Inject 120 Devices into the skin 4 (four) times daily. Use a new needle for injecting insulin4 times a day 360 each 3  . lipase/protease/amylase (CREON) 12000 UNITS CPEP capsule Take 1 capsule (12,000  Units total) by mouth 3 (three) times daily before meals. 270 capsule 3  . lisinopril (PRINIVIL,ZESTRIL) 2.5 MG tablet Take 1 tablet (2.5 mg total) by mouth daily. 90 tablet 3  . Multiple Vitamins-Minerals (CENTRUM SILVER PO) Take 1 tablet by mouth daily.     . Probiotic Product (PROBIOTIC DAILY PO) Take 3 tablets by mouth daily. 3 tabs once a day with meals.    Marland Kitchen SALINE NA Place 1 spray into both nostrils daily.    . tamsulosin (FLOMAX) 0.4 MG CAPS capsule Take 1 capsule (0.4 mg total) by mouth 2 (two) times daily. 180 capsule 3  . azelastine (ASTELIN) 0.1 % nasal spray Place 2 sprays into both nostrils 2 (two) times daily. Use in each nostril as directed (Patient not taking: Reported on 07/28/2015) 30 mL 11   No current facility-administered medications for this encounter.    Allergies  Allergen Reactions  . Clindamycin Other (See Comments)    Reaction unknown  . Other Other (See Comments)    toujeo with perioral tingling only - pt refuses to take further  . Oxycodone-Acetaminophen Other (See Comments)    Reaction unknown  . Penicillins Hives    REACTION: causes hives    Social History   Social History  . Marital Status: Married    Spouse Name: N/A  . Number of Children: 3  . Years of Education: N/A   Occupational History  . 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     Social History Main Topics  . Smoking status: Former Smoker    Types: Pipe    Quit date: 03/14/1991  . Smokeless tobacco: Never Used  . Alcohol Use: No  . Drug Use: No  . Sexual Activity: Not Currently   Other Topics Concern  . Not on file   Social History Narrative   [employer] -IT sales professional. married 1954. 2 sons - [place]; 1 daughter 3. 7 grandchildren. retired- Training and development officer.  End of Life Care: DNR, no prolonged intubation (more than 2-3 days), no sustaining tube feeding, no futile or heroic measures. (Provided signed Out of Facility order and unsigned MOST form 6/112/12)    Family  History  Problem Relation Age of Onset  . Coronary artery disease Father 35  . Heart disease Father     fatal MI  . Osteoarthritis Mother 81  . Arthritis Mother   . Coronary artery disease Brother     cabg, avr, pvd with sents  . Heart disease Brother     CABG, PVD  . Peripheral vascular disease Sister  64  . Fibromyalgia Sister   . Arthritis Sister   . Coronary artery disease Sister   . Diabetes Sister   . Cancer Brother     ROS- All systems are reviewed and negative except as per the HPI above  Physical Exam: Filed Vitals:   08/06/15 1355  BP: 118/70  Pulse: 112  Height: 6\' 2"  (1.88 m)  Weight: 185 lb 6.4 oz (84.097 kg)    GEN- The patient is well appearing, alert and oriented x 3 today.   Head- normocephalic, atraumatic Eyes-  Sclera clear, conjunctiva pink Ears- hearing intact Oropharynx- clear Neck- supple, no JVP Lymph- no cervical lymphadenopathy Lungs- Clear to ausculation bilaterally, normal work of breathing Heart- Irregular rate and rhythm, no murmurs, rubs or gallops, PMI not laterally displaced GI- soft, NT, ND, + BS Extremities- no clubbing, cyanosis, or edema MS- no significant deformity or atrophy Skin- no rash or lesion Psych- euthymic mood, full affect Neuro- strength and sensation are intact  EKG- Afib at 112 bpm, QRS 90 ms, QTc 480 ms Epic records reviewed  Assessment and Plan:  1. Afib/flutter Possibly triggered by levothyroxine HRT stopped On amiodarone 200 mg qd Scheduled for DCCV on Monday Continue eliquis without missed doses Bmet/cbc today  F/u in afib clinic one week post cardioversion   Butch Penny C. Sky Primo, [facility] [address] [place], [place] [address] [phone]

## 2015-08-10 ENCOUNTER — Encounter (HOSPITAL_COMMUNITY)
Admission: RE | Disposition: A | Discharge status: Home / Self Care | Payer: Self-pay | Source: Ambulatory Visit | Attending: Cardiovascular Disease

## 2015-08-10 ENCOUNTER — Ambulatory Visit (HOSPITAL_COMMUNITY)
Admission: RE | Admit: 2015-08-10 | Discharge: 2015-08-10 | Disposition: A | Discharge status: Home / Self Care | Payer: Medicare Other | Source: Ambulatory Visit | Attending: Cardiovascular Disease | Admitting: Cardiovascular Disease

## 2015-08-10 ENCOUNTER — Encounter (HOSPITAL_COMMUNITY): Payer: Self-pay | Admitting: *Deleted

## 2015-08-10 ENCOUNTER — Ambulatory Visit (HOSPITAL_COMMUNITY): Payer: Medicare Other | Admitting: Anesthesiology

## 2015-08-10 DIAGNOSIS — Z87891 Personal history of nicotine dependence: Secondary | ICD-10-CM | POA: Diagnosis not present

## 2015-08-10 DIAGNOSIS — Z8509 Personal history of malignant neoplasm of other digestive organs: Secondary | ICD-10-CM | POA: Insufficient documentation

## 2015-08-10 DIAGNOSIS — I10 Essential (primary) hypertension: Secondary | ICD-10-CM | POA: Diagnosis not present

## 2015-08-10 DIAGNOSIS — K219 Gastro-esophageal reflux disease without esophagitis: Secondary | ICD-10-CM | POA: Insufficient documentation

## 2015-08-10 DIAGNOSIS — I480 Paroxysmal atrial fibrillation: Secondary | ICD-10-CM | POA: Insufficient documentation

## 2015-08-10 DIAGNOSIS — N400 Enlarged prostate without lower urinary tract symptoms: Secondary | ICD-10-CM | POA: Insufficient documentation

## 2015-08-10 DIAGNOSIS — Z7901 Long term (current) use of anticoagulants: Secondary | ICD-10-CM | POA: Diagnosis not present

## 2015-08-10 DIAGNOSIS — Z79899 Other long term (current) drug therapy: Secondary | ICD-10-CM | POA: Diagnosis not present

## 2015-08-10 DIAGNOSIS — Z794 Long term (current) use of insulin: Secondary | ICD-10-CM | POA: Diagnosis not present

## 2015-08-10 DIAGNOSIS — E119 Type 2 diabetes mellitus without complications: Secondary | ICD-10-CM | POA: Insufficient documentation

## 2015-08-10 DIAGNOSIS — I4891 Unspecified atrial fibrillation: Secondary | ICD-10-CM | POA: Diagnosis not present

## 2015-08-10 DIAGNOSIS — I2510 Atherosclerotic heart disease of native coronary artery without angina pectoris: Secondary | ICD-10-CM | POA: Diagnosis not present

## 2015-08-10 HISTORY — PX: CARDIOVERSION: SHX1299

## 2015-08-10 LAB — GLUCOSE, CAPILLARY: GLUCOSE-CAPILLARY: 124 mg/dL — AB (ref 65–99)

## 2015-08-10 SURGERY — CARDIOVERSION
Anesthesia: Monitor Anesthesia Care

## 2015-08-10 MED ORDER — SODIUM CHLORIDE 0.9 % IV SOLN
INTRAVENOUS | Status: DC
  Administered 2015-08-10: 500 mL via INTRAVENOUS

## 2015-08-10 MED ORDER — PROPOFOL 10 MG/ML IV BOLUS
INTRAVENOUS | Status: DC | PRN
  Administered 2015-08-10: 70 mg via INTRAVENOUS

## 2015-08-10 MED ORDER — LIDOCAINE HCL (CARDIAC) 20 MG/ML IV SOLN
INTRAVENOUS | Status: DC | PRN
  Administered 2015-08-10: 40 mg via INTRAVENOUS

## 2015-08-10 NOTE — Anesthesia Postprocedure Evaluation (Signed)
  Anesthesia Post-op Note  Patient: [name]  Procedure(s) Performed: Procedure(s): CARDIOVERSION (N/A)  Patient Location: Endoscopy Unit  Anesthesia Type:MAC  Level of Consciousness: awake, alert , oriented and patient cooperative  Airway and Oxygen Therapy: Patient Spontanous Breathing and Patient connected to nasal cannula oxygen  Post-op Pain: none  Post-op Assessment: Post-op Vital signs reviewed, Patient's Cardiovascular Status Stable, Respiratory Function Stable, Patent Airway and No signs of Nausea or vomiting              Post-op Vital Signs: Reviewed and stable  Last Vitals:  Filed Vitals:   08/10/15 1218  BP:   Temp: 36.5 C  Resp:     Complications: No apparent anesthesia complications

## 2015-08-10 NOTE — Transfer of Care (Signed)
Immediate Anesthesia Transfer of Care Note  Patient: [name]  Procedure(s) Performed: Procedure(s): CARDIOVERSION (N/A)  Patient Location: Endoscopy Unit  Anesthesia Type:MAC  Level of Consciousness: awake, alert , oriented and patient cooperative  Airway & Oxygen Therapy: Patient Spontanous Breathing and Patient connected to nasal cannula oxygen  Post-op Assessment: Report given to RN, Post -op Vital signs reviewed and stable and Patient moving all extremities X 4  Post vital signs: Reviewed and stable  Last Vitals:  Filed Vitals:   08/10/15 1218  BP:   Temp: 36.5 C  Resp:     Complications: No apparent anesthesia complications

## 2015-08-10 NOTE — Discharge Instructions (Signed)

## 2015-08-10 NOTE — Anesthesia Preprocedure Evaluation (Addendum)
Anesthesia Evaluation  Patient identified by MRN, date of birth, ID band Patient awake    Reviewed: Allergy & Precautions, NPO status , Patient's Chart, lab work & pertinent test results  History of Anesthesia Complications Negative for: history of anesthetic complications  Airway Mallampati: II  TM Distance: >3 FB Neck ROM: Full    Dental   Pulmonary former smoker,    breath sounds clear to auscultation       Cardiovascular hypertension, + CAD and + Peripheral Vascular Disease   Rhythm:Irregular Rate:Tachycardia     Neuro/Psych Seizures -,     GI/Hepatic GERD  ,  Endo/Other  diabetesHypothyroidism   Renal/GU      Musculoskeletal   Abdominal   Peds  Hematology  (+) anemia ,   Anesthesia Other Findings   Reproductive/Obstetrics                            Anesthesia Physical Anesthesia Plan  ASA: III  Anesthesia Plan: MAC   Post-op Pain Management:    Induction: Intravenous  Airway Management Planned: Simple Face Mask  Additional Equipment:   Intra-op Plan:   Post-operative Plan: Extubation in OR  Informed Consent: I have reviewed the patients History and Physical, chart, labs and discussed the procedure including the risks, benefits and alternatives for the proposed anesthesia with the patient or authorized representative who has indicated his/her understanding and acceptance.     Plan Discussed with:   Anesthesia Plan Comments:         Anesthesia Quick Evaluation

## 2015-08-10 NOTE — Interval H&P Note (Signed)
History and Physical Interval Note:  08/10/2015 12:59 PM  [name]  has presented today for surgery, with the diagnosis of afib  The various methods of treatment have been discussed with the patient and family. After consideration of risks, benefits and other options for treatment, the patient has consented to  Procedure(s): CARDIOVERSION (N/A) as a surgical intervention .  The patient's history has been reviewed, patient examined, no change in status, stable for surgery.  I have reviewed the patient's chart and labs.  Questions were answered to the patient's satisfaction.     Shomari Matusik, Wonda Cheng

## 2015-08-10 NOTE — H&P (View-Only) (Signed)
Patient ID: [name], male   DOB: Jul 31, 1925, 81 y.o.   MRN: [id number]     Primary Care Physician: Casey Calico, MD Referring Physician: Theodoro Hoffman street triage Electrophysiologist: Dr. Vena Hoffman is a 81 y.o. male with a h/o PAF that previously took [name] but has been maintaining SR on amiodarone for the last 3 years. He starting taking levothyroxine 8 days ago for hypothyroidism and yesterday developed a fast irregular heart beat. He took two doses of extra coreg and this am BP slightly  low around 90 systolic. He measured HR as fast as 115 yesterday. This am ekg suggests aflutter at 99 bpm. He takes amiodarone 200 mg 5 days and has taken the drug this way from initiation. He was asked to increase amiodarone to qd and he stopped levothyroxine with PCP agreement.     Unfortunately, he returns today to the afib clinic and continues in afib. He has required DCCV x 2 in the past and has failed tikosyn. He would like to pursue DCCV. Has not missed any doses of DOAC.  Today, he denies symptoms of chest pain, shortness of breath, orthopnea, PND, lower extremity edema, dizziness, presyncope, syncope, or neurologic sequela. He c/o mild shortness of breath and weakness with afib.. The patient is tolerating medications without difficulties and is otherwise without complaint today.   Past Medical History  Diagnosis Date  . Acid reflux disease     history of  . H/O: GI bleed   . Paroxysmal atrial fibrillation     chronic anticoag; tikosyn  . Benign prostatic hypertrophy     history of  . Hepatic damage march 2009    retained hepatic stone , intrahepatic duct catheter  . Diabetes mellitus     insulin dep  . CAD (coronary artery disease)   . Hypertension   . 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 Seizures   . Ventricular tachycardia ?  Tikosyn proarrhtyhmia   . Cataracts, bilateral    Past Surgical History  Procedure Laterality Date  . Cardioversion    . Cholecystectomy      history of  . Partial gastrectomy      history of  . Shoulder surgery      left shoulder repair with 2 pens inserted  . Whipple procedure      operation  . Sarcoma removal from rigth tricep    . Inguinal hernia repair      inguinal herniorrhaphy, right... history of  . Appendectomy      history of  . Cardioversion  03/07/2012    Procedure: CARDIOVERSION;  Surgeon: Casey Sprang, MD;  Location: [facility];  Service: Cardiovascular;  Laterality: N/A;    Current Outpatient Prescriptions  Medication Sig Dispense Refill  . amiodarone (PACERONE) 200 MG tablet Take 1 tablet (200 mg  total) by mouth for five  days a week as directed 65 tablet 3  . Ascorbic Acid (VITAMIN C) 500 MG tablet Take 500 mg by mouth daily.     . carvedilol (COREG) 3.125 MG tablet Take 1 tablet (3.125 mg total) by mouth 2 (two) times daily with a meal. 180 tablet 3  . [facility]S GLUCOSAMINE-CHONDROITIN PO Take 1 tablet by mouth daily. 1200/600 mg    . ELIQUIS 2.5 MG TABS tablet Take 1 tablet by mouth  two  times daily 180 tablet 1  . Emollient (AMLACTIN XL) LOTN Apply 1 application topically daily. Apply lotion to legs    . finasteride (PROSCAR) 5 MG tablet Take 1 tablet (5 mg total) by mouth daily. 90 tablet 3  . Insulin Lispro, Human, (HUMALOG KWIKPEN) 200 UNIT/ML SOPN Inject 18 Units into the skin 3 (three) times daily with meals. (Patient taking differently: Inject 18 Units into the skin daily. Inject 6 units, 4 units, 8 units a total of 18 units of insulin into the skin daily. 3 times a day at different times.) 9 mL 3  . Insulin Pen Needle (B-D UF III MINI PEN NEEDLES) 31G X 5 MM MISC Inject 120 Devices into the skin 4 (four) times daily. Use a new needle for injecting insulin4 times a day 360 each 3  . lipase/protease/amylase (CREON) 12000 UNITS CPEP capsule Take 1 capsule (12,000  Units total) by mouth 3 (three) times daily before meals. 270 capsule 3  . lisinopril (PRINIVIL,ZESTRIL) 2.5 MG tablet Take 1 tablet (2.5 mg total) by mouth daily. 90 tablet 3  . Multiple Vitamins-Minerals (CENTRUM SILVER PO) Take 1 tablet by mouth daily.     . Probiotic Product (PROBIOTIC DAILY PO) Take 3 tablets by mouth daily. 3 tabs once a day with meals.    Marland Kitchen SALINE NA Place 1 spray into both nostrils daily.    . tamsulosin (FLOMAX) 0.4 MG CAPS capsule Take 1 capsule (0.4 mg total) by mouth 2 (two) times daily. 180 capsule 3  . azelastine (ASTELIN) 0.1 % nasal spray Place 2 sprays into both nostrils 2 (two) times daily. Use in each nostril as directed (Patient not taking: Reported on 07/28/2015) 30 mL 11   No current facility-administered medications for this encounter.    Allergies  Allergen Reactions  . Clindamycin Other (See Comments)    Reaction unknown  . Other Other (See Comments)    toujeo with perioral tingling only - pt refuses to take further  . Oxycodone-Acetaminophen Other (See Comments)    Reaction unknown  . Penicillins Hives    REACTION: causes hives    Social History   Social History  . Marital Status: Married    Spouse Name: N/A  . Number of Children: 3  . Years of Education: N/A   Occupational History  . 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     Social History Main Topics  . Smoking status: Former Smoker    Types: Pipe    Quit date: 03/14/1991  . Smokeless tobacco: Never Used  . Alcohol Use: No  . Drug Use: No  . Sexual Activity: Not Currently   Other Topics Concern  . Not on file   Social History Narrative   [employer] -IT sales professional. married 1954. 2 sons - [name]; 1 daughter 28. 7 grandchildren. retired- Training and development officer.  End of Life Care: DNR, no prolonged intubation (more than 2-3 days), no sustaining tube feeding, no futile or heroic measures. (Provided signed Out of Facility order and unsigned MOST form 6/112/12)    Family  History  Problem Relation Age of Onset  . Coronary artery disease Father 81  . Heart disease Father     fatal MI  . Osteoarthritis Mother 53  . Arthritis Mother   . Coronary artery disease Brother     cabg, avr, pvd with sents  . Heart disease Brother     CABG, PVD  . Peripheral vascular disease Sister  57  . Fibromyalgia Sister   . Arthritis Sister   . Coronary artery disease Sister   . Diabetes Sister   . Cancer Brother     ROS- All systems are reviewed and negative except as per the HPI above  Physical Exam: Filed Vitals:   08/06/15 1355  BP: 118/70  Pulse: 112  Height: 6\' 2"  (1.88 m)  Weight: 185 lb 6.4 oz (84.097 kg)    GEN- The patient is well appearing, alert and oriented x 3 today.   Head- normocephalic, atraumatic Eyes-  Sclera clear, conjunctiva pink Ears- hearing intact Oropharynx- clear Neck- supple, no JVP Lymph- no cervical lymphadenopathy Lungs- Clear to ausculation bilaterally, normal work of breathing Heart- Irregular rate and rhythm, no murmurs, rubs or gallops, PMI not laterally displaced GI- soft, NT, ND, + BS Extremities- no clubbing, cyanosis, or edema MS- no significant deformity or atrophy Skin- no rash or lesion Psych- euthymic mood, full affect Neuro- strength and sensation are intact  EKG- Afib at 112 bpm, QRS 90 ms, QTc 480 ms Epic records reviewed  Assessment and Plan:  1. Afib/flutter Possibly triggered by levothyroxine HRT stopped On amiodarone 200 mg qd Scheduled for DCCV on Monday Continue eliquis without missed doses Bmet/cbc today  F/u in afib clinic one week post cardioversion   Butch Penny C. Ramatoulaye Pack, [facility] [address] [place], [place] [address] [phone]

## 2015-08-11 ENCOUNTER — Encounter: Payer: Self-pay | Admitting: Internal Medicine

## 2015-08-11 ENCOUNTER — Encounter (HOSPITAL_COMMUNITY): Payer: Self-pay | Admitting: Nurse Practitioner

## 2015-08-12 ENCOUNTER — Encounter (HOSPITAL_COMMUNITY): Payer: Self-pay | Admitting: Cardiovascular Disease

## 2015-08-13 ENCOUNTER — Ambulatory Visit (INDEPENDENT_AMBULATORY_CARE_PROVIDER_SITE_OTHER): Payer: Medicare Other | Admitting: Internal Medicine

## 2015-08-13 ENCOUNTER — Encounter: Payer: Self-pay | Admitting: Internal Medicine

## 2015-08-13 VITALS — BP 114/60 | HR 63 | Temp 97.7°F | Resp 16 | Wt 187.0 lb

## 2015-08-13 DIAGNOSIS — J209 Acute bronchitis, unspecified: Secondary | ICD-10-CM | POA: Diagnosis not present

## 2015-08-13 DIAGNOSIS — J310 Chronic rhinitis: Secondary | ICD-10-CM

## 2015-08-13 DIAGNOSIS — K868 Other specified diseases of pancreas: Secondary | ICD-10-CM | POA: Diagnosis not present

## 2015-08-13 DIAGNOSIS — K8689 Other specified diseases of pancreas: Secondary | ICD-10-CM

## 2015-08-13 MED ORDER — DOXYCYCLINE HYCLATE 100 MG PO TABS: 100.0000 mg | ORAL_TABLET | Freq: Two times a day (BID) | ORAL | Status: DC

## 2015-08-13 NOTE — CV Procedure (Signed)
    Cardioversion Note  [name] [id number] [birth year]  Procedure: DC Cardioversion Indications: Atrial fib  Procedure Details Consent: Obtained Time Out: Verified patient identification, verified procedure, site/side was marked, verified correct patient position, special equipment/implants available, Radiology Safety Procedures followed,  medications/allergies/relevent history reviewed, required imaging and test results available.  Performed  The patient has been on adequate anticoagulation.  The patient received IV Propofol  for sedation.  Synchronous cardioversion was performed at 120 joules.  The cardioversion was successful     Complications: No apparent complications Patient did tolerate procedure well.   Thayer Headings, Brooke Bonito., MD, [facility] 08/13/2015, 11:17 PM

## 2015-08-13 NOTE — Patient Instructions (Signed)

## 2015-08-13 NOTE — Progress Notes (Signed)
Pre visit review using our clinic review tool, if applicable. No additional management support is needed unless otherwise documented below in the visit note. 

## 2015-08-13 NOTE — Progress Notes (Signed)
   Subjective:    Patient ID: [name], male    DOB: 1925-11-21, 81 y.o.   MRN: [id number]  HPI   Symptoms began 08/09/15 as congestion in his chest. He was cardioverted 9/12. During the period 9/13-14 he began to have production of clear to yellow/green sputum. Mucinex has been of some benefit. He describes postnasal drainage with clear secretions. He has some shortness of breath with the chest congestion. He also describes weakness.  He has no other upper respiratory tract infection symptoms.  He has had chronic rhinitis for years.  He has diabetes in the context of Whipple's procedure. He is on insulin and glucose ranges from 67-200.  Review of Systems Frontal headache, facial pain , nasal purulence, dental pain, sore throat , otic pain or otic discharge denied. No fever , chills or sweats.     Objective:   Physical Exam  Pattern baldness is present. The nasal septum is deviated to the right and dislocated the left. There is mild erythema present. He has a regular rhythm; there is a diastolic murmur. He has minor rales in the left lower lobe but no increased work of breathing. His gait is slow and slightly unsteady.  General appearance:Adequately nourished; no acute distress or increased work of breathing is present.    Lymphatic: No  lymphadenopathy about the head, neck, or axilla .  Eyes: No conjunctival inflammation or lid edema is present. There is no scleral icterus.  Ears:  External ear exam shows no significant lesions or deformities.  Otoscopic examination reveals clear canals, tympanic membranes are intact bilaterally without bulging, retraction, inflammation or discharge.  Nose:  External nasal examination shows no deformity or inflammation.   Oral exam: Dental hygiene is good; lips and gums are healthy appearing.There is no oropharyngeal erythema or exudate .  Neck:  No deformities, thyromegaly, masses, or tenderness noted.   Supple with full range of motion  without pain.   Heart:  Normal rate and regular rhythm. S1 and S2 normal without gallop,  click, rub or other extra sounds.   Extremities:  No cyanosis, edema, or clubbing  noted   Skin: Warm & dry w/o tenting or jaundice. No significant lesions or rash.     Assessment & Plan:  #1 acute bronchitis w/o bronchospasm. Because of penicillin allergy and potential for possible QT prolongation with azithromycin in the context of amiodarone therapy; doxycycline will be prescribed. #2 chronic nonallergic rhinitis Plan: See orders and recommendations

## 2015-08-14 ENCOUNTER — Encounter (HOSPITAL_COMMUNITY): Payer: Self-pay | Admitting: Nurse Practitioner

## 2015-08-14 ENCOUNTER — Ambulatory Visit (HOSPITAL_COMMUNITY)
Admission: RE | Admit: 2015-08-14 | Discharge: 2015-08-14 | Disposition: A | Discharge status: Home / Self Care | Payer: Medicare Other | Source: Ambulatory Visit | Attending: Nurse Practitioner | Admitting: Nurse Practitioner

## 2015-08-14 VITALS — BP 114/62 | HR 63 | Ht 74.0 in | Wt 188.0 lb

## 2015-08-14 DIAGNOSIS — Z7902 Long term (current) use of antithrombotics/antiplatelets: Secondary | ICD-10-CM | POA: Insufficient documentation

## 2015-08-14 DIAGNOSIS — Z79899 Other long term (current) drug therapy: Secondary | ICD-10-CM | POA: Insufficient documentation

## 2015-08-14 DIAGNOSIS — I10 Essential (primary) hypertension: Secondary | ICD-10-CM | POA: Diagnosis not present

## 2015-08-14 DIAGNOSIS — K219 Gastro-esophageal reflux disease without esophagitis: Secondary | ICD-10-CM | POA: Diagnosis not present

## 2015-08-14 DIAGNOSIS — Z833 Family history of diabetes mellitus: Secondary | ICD-10-CM | POA: Insufficient documentation

## 2015-08-14 DIAGNOSIS — Z87891 Personal history of nicotine dependence: Secondary | ICD-10-CM | POA: Diagnosis not present

## 2015-08-14 DIAGNOSIS — Z880 Allergy status to penicillin: Secondary | ICD-10-CM | POA: Diagnosis not present

## 2015-08-14 DIAGNOSIS — I4891 Unspecified atrial fibrillation: Secondary | ICD-10-CM | POA: Diagnosis present

## 2015-08-14 DIAGNOSIS — E119 Type 2 diabetes mellitus without complications: Secondary | ICD-10-CM | POA: Insufficient documentation

## 2015-08-14 DIAGNOSIS — Z794 Long term (current) use of insulin: Secondary | ICD-10-CM | POA: Diagnosis not present

## 2015-08-14 DIAGNOSIS — I481 Persistent atrial fibrillation: Secondary | ICD-10-CM

## 2015-08-14 DIAGNOSIS — Z8249 Family history of ischemic heart disease and other diseases of the circulatory system: Secondary | ICD-10-CM | POA: Insufficient documentation

## 2015-08-14 DIAGNOSIS — R0989 Other specified symptoms and signs involving the circulatory and respiratory systems: Secondary | ICD-10-CM | POA: Insufficient documentation

## 2015-08-14 DIAGNOSIS — I480 Paroxysmal atrial fibrillation: Secondary | ICD-10-CM | POA: Insufficient documentation

## 2015-08-14 DIAGNOSIS — Z8509 Personal history of malignant neoplasm of other digestive organs: Secondary | ICD-10-CM | POA: Insufficient documentation

## 2015-08-14 DIAGNOSIS — I2510 Atherosclerotic heart disease of native coronary artery without angina pectoris: Secondary | ICD-10-CM | POA: Diagnosis not present

## 2015-08-14 DIAGNOSIS — I4819 Other persistent atrial fibrillation: Secondary | ICD-10-CM

## 2015-08-14 NOTE — Progress Notes (Signed)
Patient ID: [name], male   DOB: 06/22/1925, 81 y.o.   MRN: [id number]     Primary Care Physician: [name], MD Referring Physician: Dr. Vena Austria is a 81 y.o. male with a h/o PAF that recently had DCCV and is here for f/u. It was successful and he is in SR today. He developed some chest congestion, saw his PCP yesterday and is on antibiotics. He continues blood thinner without fail. Prior to [place], he was on amiodarone 5 days a week increased to 7 days a week during time of waiting for DCCV, now will return to 5 days a week.  Today, he denies symptoms of palpitations, chest pain, shortness of breath, orthopnea, PND, lower extremity edema, dizziness, presyncope, syncope, or neurologic sequela. The patient is tolerating medications without difficulties and is otherwise without complaint today.   Past Medical History  Diagnosis Date  . Acid reflux disease     history of  . H/O: GI bleed   . Paroxysmal atrial fibrillation     chronic anticoag; tikosyn  . Benign prostatic hypertrophy     history of  . Hepatic damage march 2009    retained hepatic stone , intrahepatic duct catheter  . Diabetes mellitus     insulin dep  . CAD (coronary artery disease)   . Hypertension   . 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 Seizures   . Ventricular tachycardia ? Tikosyn proarrhtyhmia   . Cataracts, bilateral    Past Surgical History  Procedure Laterality Date  . Cardioversion    . Cholecystectomy      history of  . Partial gastrectomy      history of  . Shoulder surgery      left shoulder repair with 2 pens inserted  . Whipple procedure      operation  . Sarcoma removal from rigth tricep    . Inguinal hernia repair      inguinal herniorrhaphy, right... history of  . Appendectomy      history of  . Cardioversion  03/07/2012   Procedure: CARDIOVERSION;  Surgeon: Deboraha Sprang, MD;  Location: [facility];  Service: Cardiovascular;  Laterality: N/A;  . Cardioversion N/A 08/10/2015    Procedure: CARDIOVERSION;  Surgeon: Thayer Headings, MD;  Location: [facility] ENDOSCOPY;  Service: Cardiovascular;  Laterality: N/A;    Current Outpatient Prescriptions  Medication Sig Dispense Refill  . amiodarone (PACERONE) 200 MG tablet Take 1 tablet (200 mg  total) by mouth for five  days a week as directed 65 tablet 3  . Ascorbic Acid (VITAMIN C) 500 MG tablet Take 500 mg by mouth daily.     . carvedilol (COREG) 3.125 MG tablet Take 1 tablet (3.125 mg total) by mouth 2 (two) times daily with a meal. 180 tablet 3  . CVS GLUCOSAMINE-CHONDROITIN PO Take 1 tablet by mouth daily. 1200/600 mg    . doxycycline (VIBRA-TABS) 100 MG tablet Take 1 tablet (100 mg total) by mouth 2 (two) times daily. 14 tablet 0  . ELIQUIS 2.5 MG TABS tablet Take 1 tablet by mouth two  times daily 180 tablet 1  . Emollient (AMLACTIN XL) LOTN Apply 1 application topically daily. Apply lotion to legs    . finasteride (PROSCAR) 5 MG tablet Take 1 tablet (5 mg total) by  mouth daily. 90 tablet 3  . insulin aspart (NOVOLOG FLEXPEN) 100 UNIT/ML FlexPen Inject 4-8 Units into the skin 3 (three) times daily with meals. Inject 6 units subcutaneously with breakfast, 4 units with lunch and 8 units with supper    . Insulin Detemir (LEVEMIR FLEXTOUCH) 100 UNIT/ML Pen Inject 6 Units into the skin at bedtime.    . Insulin Pen Needle (B-D UF III MINI PEN NEEDLES) 31G X 5 MM MISC Inject 120 Devices into the skin 4 (four) times daily. Use a new needle for injecting insulin4 times a day 360 each 3  . lipase/protease/amylase (CREON) 12000 UNITS CPEP capsule Take 1 capsule (12,000 Units total) by mouth 3 (three) times daily before meals. 270 capsule 3  . lisinopril (PRINIVIL,ZESTRIL) 2.5 MG tablet Take 1 tablet (2.5 mg total) by mouth daily. 90 tablet 3  . Multiple Vitamins-Minerals (CENTRUM SILVER  PO) Take 1 tablet by mouth daily.     . Probiotic Product (PROBIOTIC DAILY PO) Take 3 tablets by mouth daily. 3 tabs once a day with meals.    Marland Kitchen SALINE NA Place 1 spray into both nostrils daily.    . tamsulosin (FLOMAX) 0.4 MG CAPS capsule Take 1 capsule (0.4 mg total) by mouth 2 (two) times daily. 180 capsule 3   No current facility-administered medications for this encounter.    Allergies  Allergen Reactions  . Toujeo Solostar [Insulin Glargine] Other (See Comments)    Shaking, felt bad all over  . Clindamycin Diarrhea  . Other Other (See Comments)    toujeo with perioral tingling only - pt refuses to take further  . Oxycodone-Acetaminophen Diarrhea  . Penicillins Hives    Has patient had a PCN reaction causing immediate rash, facial/tongue/throat swelling, SOB or lightheadedness with hypotension: Yes Has patient had a PCN reaction causing severe rash involving mucus membranes or skin necrosis: no  Has patient had a PCN reaction that required hospitalization No Has patient had a PCN reaction occurring within the last 10 years: No If all of the above answers are "NO", then may proceed with Cephalosporin use.    Social History   Social History  . Marital Status: Married    Spouse Name: N/A  . Number of Children: 3  . Years of Education: N/A   Occupational History  . 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     Social History Main Topics  . Smoking status: Former Smoker    Types: Pipe    Quit date: 03/14/1991  . Smokeless tobacco: Never Used  . Alcohol Use: No  . Drug Use: No  . Sexual Activity: Not Currently   Other Topics Concern  . Not on file   Social History Narrative   [employer] -IT sales professional. married 1954. 2 sons - Rivereno; 1 daughter 36. 7 grandchildren. retired- Training and development officer.  End of Life Care: DNR, no prolonged intubation (more than 2-3 days), no sustaining tube feeding, no futile or heroic measures. (Provided signed Out of Facility order  and unsigned MOST form 6/112/12)    Family History  Problem Relation Age of Onset  . Coronary artery disease Father 103  . Heart disease Father     fatal MI  . Osteoarthritis Mother 19  . Arthritis Mother   . Coronary artery disease Brother     cabg, avr, pvd with sents  . Heart disease Brother     CABG, PVD  . Peripheral vascular disease Sister 34  .  Fibromyalgia Sister   . Arthritis Sister   . Coronary artery disease Sister   . Diabetes Sister   . Cancer Brother     ROS- All systems are reviewed and negative except as per the HPI above  Physical Exam: Filed Vitals:   08/14/15 1035  BP: 114/62  Pulse: 63  Height: 6\' 2"  (1.88 m)  Weight: 188 lb (85.276 kg)    GEN- The patient is well appearing, alert and oriented x 3 today.   Head- normocephalic, atraumatic Eyes-  Sclera clear, conjunctiva pink Ears- hearing intact Oropharynx- clear Neck- supple, no JVP Lymph- no cervical lymphadenopathy Lungs- Clear to ausculation bilaterally, normal work of breathing Heart- Regular rate and rhythm, no murmurs, rubs or gallops, PMI not laterally displaced GI- soft, NT, ND, + BS Extremities- no clubbing, cyanosis, or edema MS- no significant deformity or atrophy Skin- no rash or lesion Psych- euthymic mood, full affect Neuro- strength and sensation are intact  EKG- SR with 1st degree AV block, LAD, Pr int 212 ms, QRS 86 ms, QTc 480 ms  Assessment and Plan:  1.PAF Maintaining SR with successful cardioversion Decrease amiodarone to prior dose, 200 mg 5 days a week Continue eliquis 2.5 mg a day without fail  2. Chest congestion Finish antibiotics Muccinex/robitussin (no D) as needed F/u with PCP if no improvement  F/u with ALynnell Jude as scheduled 11/14  Lizmarie Witters C. Michole Lecuyer, [facility] [address] [place], [place] [address] [phone]

## 2015-08-14 NOTE — Patient Instructions (Signed)
Go back to taking amiodarone 5 days a week as previously prescribed.

## 2015-08-17 ENCOUNTER — Ambulatory Visit (HOSPITAL_COMMUNITY): Payer: Medicare Other | Admitting: Nurse Practitioner

## 2015-08-20 ENCOUNTER — Encounter: Payer: Self-pay | Admitting: Internal Medicine

## 2015-08-25 ENCOUNTER — Ambulatory Visit (INDEPENDENT_AMBULATORY_CARE_PROVIDER_SITE_OTHER): Payer: Medicare Other | Admitting: Podiatry

## 2015-08-25 DIAGNOSIS — M205X9 Other deformities of toe(s) (acquired), unspecified foot: Secondary | ICD-10-CM

## 2015-08-25 DIAGNOSIS — E1142 Type 2 diabetes mellitus with diabetic polyneuropathy: Secondary | ICD-10-CM

## 2015-08-25 DIAGNOSIS — M2040 Other hammer toe(s) (acquired), unspecified foot: Secondary | ICD-10-CM

## 2015-08-25 DIAGNOSIS — R0989 Other specified symptoms and signs involving the circulatory and respiratory systems: Secondary | ICD-10-CM

## 2015-08-25 DIAGNOSIS — M2042 Other hammer toe(s) (acquired), left foot: Secondary | ICD-10-CM | POA: Diagnosis not present

## 2015-08-25 DIAGNOSIS — M2010 Hallux valgus (acquired), unspecified foot: Secondary | ICD-10-CM

## 2015-08-25 DIAGNOSIS — M2041 Other hammer toe(s) (acquired), right foot: Secondary | ICD-10-CM | POA: Diagnosis not present

## 2015-08-25 DIAGNOSIS — M2011 Hallux valgus (acquired), right foot: Secondary | ICD-10-CM | POA: Diagnosis not present

## 2015-08-25 DIAGNOSIS — M2012 Hallux valgus (acquired), left foot: Secondary | ICD-10-CM | POA: Diagnosis not present

## 2015-08-25 NOTE — Patient Instructions (Signed)

## 2015-08-25 NOTE — Progress Notes (Signed)
Patient ID: [name], male   DOB: 11/15/1925, 81 y.o.   MRN: [id number] Patient presents for diabetic shoe pick up, shoes are tried on for good fit.  Patient received 1 Pair Orthofeet [address] black mens size 12.5 wide and 3 pairs custom molded diabetic inserts.  Verbal and written break in and wear instructions given.  Patient will follow up for scheduled routine care.   Satisfactory fit of diabetic shoes with custom foot orthotics  Richard C Tuchman DP and

## 2015-09-02 ENCOUNTER — Encounter: Payer: Self-pay | Admitting: Internal Medicine

## 2015-09-02 ENCOUNTER — Ambulatory Visit (INDEPENDENT_AMBULATORY_CARE_PROVIDER_SITE_OTHER): Payer: Medicare Other | Admitting: Internal Medicine

## 2015-09-02 ENCOUNTER — Other Ambulatory Visit: Payer: Self-pay | Admitting: Internal Medicine

## 2015-09-02 VITALS — BP 102/60 | HR 101 | Ht 74.0 in | Wt 182.6 lb

## 2015-09-02 DIAGNOSIS — I4819 Other persistent atrial fibrillation: Secondary | ICD-10-CM

## 2015-09-02 DIAGNOSIS — I481 Persistent atrial fibrillation: Secondary | ICD-10-CM

## 2015-09-02 MED ORDER — FUROSEMIDE 20 MG PO TABS: ORAL_TABLET | ORAL | Status: DC

## 2015-09-02 MED ORDER — CARVEDILOL 6.25 MG PO TABS: 6.2500 mg | ORAL_TABLET | Freq: Two times a day (BID) | ORAL | Status: DC

## 2015-09-02 NOTE — Patient Instructions (Signed)
Medication Instructions: 1) Stop amiodarone 2) Increase coreg (carvedilol) to 6.25 mg one tablet by mouth twice daily 3) Start Lasix (furosemide) 20 mg one tablet by mouth once every other day  Labwork: - Your physician recommends that you return for lab work in: 2 weeks - BMP  Procedures/Testing: - none  Follow-Up: - Your physician recommends that you schedule a follow-up appointment in: 4 weeks with Roderic Palau, NP ([facility])  Any Additional Special Instructions Will Be Listed Below (If Applicable). - none

## 2015-09-02 NOTE — Progress Notes (Signed)
Patient Care Team: Janith Lima, MD as PCP - General (Internal Medicine) Deboraha Sprang, MD as Consulting Physician (Cardiology) Marchia Bond, MD as Consulting Physician (Orthopedic Surgery)   HPI  [name] is a 81 y.o. male seen in followup for paroxysmal atrial fibrillation for which he  Took Tikosyn but now is on amiodarone    He had a life-threatening retroperitoneal bleed which prompted the discontinuation of his anticoagulation. It was his decision at follow-up 3/15 to forego further anticoagulation.  He developed atirl afibirllation was placed on apixoban and then cardioverted under the care of AFib clinic He is unaware of atrial fibrillation. He had no improvement in symptoms following his cardioversion and the symptoms that he ascribed to the onset of atrial fibrillation, or perhaps more correctly its detection, resolved spontaneously despite persistence of atrial fibrillation  Echocardiogram 4/13 EF 50%     Past Medical History  Diagnosis Date  . Acid reflux disease     history of  . H/O: GI bleed   . Paroxysmal atrial fibrillation (HCC)     chronic anticoag; tikosyn  . Benign prostatic hypertrophy     history of  . Hepatic damage march 2009    retained hepatic stone , intrahepatic duct catheter  . Diabetes mellitus     insulin dep  . CAD (coronary artery disease)   . Hypertension   . 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 Seizures ([place])   . Ventricular tachycardia ? Tikosyn proarrhtyhmia   . Cataracts, bilateral     Past Surgical History  Procedure Laterality Date  . Cardioversion    . Cholecystectomy      history of  . Partial gastrectomy      history of  . Shoulder surgery      left shoulder repair with 2 pens inserted  . Whipple procedure      operation  . Sarcoma removal from rigth  tricep    . Inguinal hernia repair      inguinal herniorrhaphy, right... history of  . Appendectomy      history of  . Cardioversion  03/07/2012    Procedure: CARDIOVERSION;  Surgeon: Deboraha Sprang, MD;  Location: [facility];  Service: Cardiovascular;  Laterality: N/A;  . Cardioversion N/A 08/10/2015    Procedure: CARDIOVERSION;  Surgeon: Thayer Headings, MD;  Location: [facility] ENDOSCOPY;  Service: Cardiovascular;  Laterality: N/A;    Current Outpatient Prescriptions  Medication Sig Dispense Refill  . amiodarone (PACERONE) 200 MG tablet Take 1 tablet (200 mg  total) by mouth for five  days a week as directed 65 tablet 3  . Ascorbic Acid (VITAMIN C) 500 MG tablet Take 500 mg by mouth daily.     . carvedilol (COREG) 3.125 MG tablet Take 1 tablet (3.125 mg total) by mouth 2 (two) times daily with a meal. 180 tablet 3  . CVS GLUCOSAMINE-CHONDROITIN PO Take 1 tablet by mouth daily. 1200/600 mg    . ELIQUIS 2.5 MG TABS tablet Take 1 tablet by mouth two  times daily 180 tablet 1  . Emollient (AMLACTIN XL) LOTN Apply 1 application topically daily. Apply lotion to legs    . finasteride (PROSCAR) 5 MG tablet Take 1 tablet (5 mg total) by mouth daily. 90 tablet 3  . insulin aspart (  NOVOLOG FLEXPEN) 100 UNIT/ML FlexPen Inject 4-8 Units into the skin 3 (three) times daily with meals. Inject 6 units subcutaneously with breakfast, 4 units with lunch and 8 units with supper    . Insulin Detemir (LEVEMIR FLEXTOUCH) 100 UNIT/ML Pen Inject 6 Units into the skin at bedtime.    . Insulin Pen Needle (B-D UF III MINI PEN NEEDLES) 31G X 5 MM MISC Inject 120 Devices into the skin 4 (four) times daily. Use a new needle for injecting insulin4 times a day 360 each 3  . lipase/protease/amylase (CREON) 12000 UNITS CPEP capsule Take 1 capsule (12,000 Units total) by mouth 3 (three) times daily before meals. 270 capsule 3  . lisinopril (PRINIVIL,ZESTRIL) 2.5 MG tablet Take 1 tablet (2.5 mg total) by mouth daily. 90 tablet 3  .  Multiple Vitamins-Minerals (CENTRUM SILVER PO) Take 1 tablet by mouth daily.     . Probiotic Product (PROBIOTIC DAILY PO) Take 3 tablets by mouth daily. 3 tabs once a day with meals.    Marland Kitchen SALINE NA Place 1 spray into both nostrils daily.    . tamsulosin (FLOMAX) 0.4 MG CAPS capsule Take 1 capsule (0.4 mg total) by mouth 2 (two) times daily. 180 capsule 3   No current facility-administered medications for this visit.    Allergies  Allergen Reactions  . Toujeo Solostar [Insulin Glargine] Other (See Comments)    Shaking, felt bad all over  . Clindamycin Diarrhea  . Other Other (See Comments)    toujeo with perioral tingling only - pt refuses to take further  . Oxycodone-Acetaminophen Diarrhea  . Penicillins Hives    Has patient had a PCN reaction causing immediate rash, facial/tongue/throat swelling, SOB or lightheadedness with hypotension: Yes Has patient had a PCN reaction causing severe rash involving mucus membranes or skin necrosis: no  Has patient had a PCN reaction that required hospitalization No Has patient had a PCN reaction occurring within the last 10 years: No If all of the above answers are "NO", then may proceed with Cephalosporin use.    Review of Systems negative except from HPI and PMH  Physical Exam BP 102/60 mmHg  Pulse 101  Ht 6\' 2"  (1.88 m)  Wt 182 lb 9.6 oz (82.827 kg)  BMI 23.43 kg/m2 Well developed and nourished in no acute distress HENT normal Neck supple with JVP-flat Clear Regular rate and rhythm, no murmurs or gallops Abd-soft with active BS No Clubbing cyanosis edema Skin-warm and dry A & Oriented  Grossly normal sensory and motor function   ECG demonstrates atrial fibrillation at 101 Intervals-/09/36  Assessment and  Plan  Atrial fibrillation    History of retroperitoneal bleed  TSH elevation likely secondary to amiodarone  Ventricular tachycardia  No intercurrent Ventricular tachycardia   Spells of warmth question related to low  blood pressure  In the absence of symptoms of atrial fibrillation at this juncture we will discontinue his amiodarone we will increase his carvedilol for heart rate control realizing we may encroach upon his low blood pressure which sometimes is in the 90s albeit asymptomatic. Digoxin may be required for rate control.  He has significant peripheral edema. We will put him on every other day furosemide check his metabolic profile in 2 weeks time  potassium was 4.7   9/16 and creatinine was 1.25

## 2015-09-04 ENCOUNTER — Encounter (HOSPITAL_COMMUNITY): Payer: Self-pay | Admitting: Nurse Practitioner

## 2015-09-09 ENCOUNTER — Ambulatory Visit: Payer: Medicare Other | Admitting: *Deleted

## 2015-09-09 DIAGNOSIS — E1142 Type 2 diabetes mellitus with diabetic polyneuropathy: Secondary | ICD-10-CM

## 2015-09-09 NOTE — Progress Notes (Signed)
Patient ID: [name], male   DOB: [birth year], 81 y.o.   MRN: [id number] Patient presents stating that he feels his shoes are just too big.  Shoes will be returned and we will reorder

## 2015-09-16 ENCOUNTER — Other Ambulatory Visit (INDEPENDENT_AMBULATORY_CARE_PROVIDER_SITE_OTHER): Payer: Medicare Other | Admitting: *Deleted

## 2015-09-16 DIAGNOSIS — I481 Persistent atrial fibrillation: Secondary | ICD-10-CM | POA: Diagnosis not present

## 2015-09-16 DIAGNOSIS — I4729 Other ventricular tachycardia: Secondary | ICD-10-CM

## 2015-09-16 DIAGNOSIS — I472 Ventricular tachycardia: Secondary | ICD-10-CM

## 2015-09-16 DIAGNOSIS — I4819 Other persistent atrial fibrillation: Secondary | ICD-10-CM

## 2015-09-16 DIAGNOSIS — I872 Venous insufficiency (chronic) (peripheral): Secondary | ICD-10-CM

## 2015-09-16 LAB — BASIC METABOLIC PANEL
BUN: 22 mg/dL (ref 7–25)
CHLORIDE: 104 mmol/L (ref 98–110)
CO2: 22 mmol/L (ref 20–31)
CREATININE: 1.25 mg/dL — AB (ref 0.70–1.11)
Calcium: 8.6 mg/dL (ref 8.6–10.3)
GLUCOSE: 118 mg/dL — AB (ref 65–99)
POTASSIUM: 4.3 mmol/L (ref 3.5–5.3)
Sodium: 135 mmol/L (ref 135–146)

## 2015-09-16 NOTE — Addendum Note (Signed)
Addended by: Eulis Foster on: 09/16/2015 10:07 AM   Modules accepted: Orders

## 2015-09-23 ENCOUNTER — Ambulatory Visit: Payer: Medicare Other | Admitting: *Deleted

## 2015-09-23 DIAGNOSIS — E1142 Type 2 diabetes mellitus with diabetic polyneuropathy: Secondary | ICD-10-CM

## 2015-09-23 NOTE — Patient Instructions (Signed)

## 2015-09-23 NOTE — Progress Notes (Signed)
Patient ID: [name], male   DOB: [birth year], 81 y.o.   MRN: [id number] Patient presents for pick up of reordered diabetic shoes, shoes are tried on for good fit.  Patient received 1 Pair of Apex Y900M Ariaya Moc Toe in men's 12.5 wide.  Verbal and written break in and wear instructions given.  Patient will follow up for scheduled routine care.

## 2015-09-30 ENCOUNTER — Ambulatory Visit (HOSPITAL_COMMUNITY)
Admission: RE | Admit: 2015-09-30 | Discharge: 2015-09-30 | Disposition: A | Discharge status: Home / Self Care | Payer: Medicare Other | Source: Ambulatory Visit | Attending: Nurse Practitioner | Admitting: Nurse Practitioner

## 2015-09-30 ENCOUNTER — Encounter (HOSPITAL_COMMUNITY): Payer: Self-pay | Admitting: Nurse Practitioner

## 2015-09-30 VITALS — BP 96/82 | HR 88 | Ht 74.0 in | Wt 183.6 lb

## 2015-09-30 DIAGNOSIS — I959 Hypotension, unspecified: Secondary | ICD-10-CM | POA: Insufficient documentation

## 2015-09-30 DIAGNOSIS — I482 Chronic atrial fibrillation, unspecified: Secondary | ICD-10-CM

## 2015-09-30 DIAGNOSIS — I480 Paroxysmal atrial fibrillation: Secondary | ICD-10-CM | POA: Insufficient documentation

## 2015-09-30 NOTE — Progress Notes (Signed)
Patient ID: [name], male   DOB: 1925/02/16, 81 y.o.   MRN: [id number]     Primary Care Physician: [name], MD Referring Physician: Dr. Vena Austria is a 81 y.o. male with a h/o PAF that has failed tikosyn, amiodarone, recent DCCV with ERAF, that saw Dr. Caryl Comes in f/u 10/5 and it was decided that he would be left in afib since he was relatively asymptomatic. He is rate controlled today. Chronic LLE and is on lasix qod. Still appears to be tolerating chronic afib.  Today, he denies symptoms of palpitations, chest pain, shortness of breath, orthopnea, PND,  chronic lower extremity edema, dizziness, presyncope, syncope, or neurologic sequela. The patient is tolerating medications without difficulties and is otherwise without complaint today.   Past Medical History  Diagnosis Date  . Acid reflux disease     history of  . H/O: GI bleed   . Paroxysmal atrial fibrillation (HCC)     chronic anticoag; tikosyn  . Benign prostatic hypertrophy     history of  . Hepatic damage march 2009    retained hepatic stone , intrahepatic duct catheter  . Diabetes mellitus     insulin dep  . CAD (coronary artery disease)   . Hypertension   . 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 Seizures ([place])   . Ventricular tachycardia ? Tikosyn proarrhtyhmia   . Cataracts, bilateral    Past Surgical History  Procedure Laterality Date  . Cardioversion    . Cholecystectomy      history of  . Partial gastrectomy      history of  . Shoulder surgery      left shoulder repair with 2 pens inserted  . Whipple procedure      operation  . Sarcoma removal from rigth tricep    . Inguinal hernia repair      inguinal herniorrhaphy, right... history of  . Appendectomy      history of  . Cardioversion  03/07/2012    Procedure: CARDIOVERSION;  Surgeon:  Deboraha Sprang, MD;  Location: [facility];  Service: Cardiovascular;  Laterality: N/A;  . Cardioversion N/A 08/10/2015    Procedure: CARDIOVERSION;  Surgeon: Thayer Headings, MD;  Location: [facility] ENDOSCOPY;  Service: Cardiovascular;  Laterality: N/A;    Current Outpatient Prescriptions  Medication Sig Dispense Refill  . Ascorbic Acid (VITAMIN C) 500 MG tablet Take 500 mg by mouth daily.     . carvedilol (COREG) 6.25 MG tablet Take 1 tablet (6.25 mg total) by mouth 2 (two) times daily. (Patient taking differently: Take 3.125 mg by mouth 2 (two) times daily. ) 180 tablet 3  . CVS GLUCOSAMINE-CHONDROITIN PO Take 1 tablet by mouth daily. 1200/600 mg    . ELIQUIS 2.5 MG TABS tablet Take 1 tablet by mouth two  times daily 180 tablet 1  . Emollient (AMLACTIN XL) LOTN Apply 1 application topically daily. Apply lotion to legs    . finasteride (PROSCAR) 5 MG tablet Take 1 tablet (5 mg total) by mouth daily. 90 tablet 3  . furosemide (LASIX) 20 MG tablet TAKE 1 TABLET BY MOUTH EVERY OTHER DAY 45 tablet 1  . insulin aspart (NOVOLOG FLEXPEN) 100 UNIT/ML FlexPen Inject 4-8 Units into the skin 3 (three) times daily with meals. Inject 6 units subcutaneously with  breakfast, 4 units with lunch and 8 units with supper    . Insulin Detemir (LEVEMIR FLEXTOUCH) 100 UNIT/ML Pen Inject 6 Units into the skin at bedtime.    . Insulin Pen Needle (B-D UF III MINI PEN NEEDLES) 31G X 5 MM MISC Inject 120 Devices into the skin 4 (four) times daily. Use a new needle for injecting insulin4 times a day 360 each 3  . lipase/protease/amylase (CREON) 12000 UNITS CPEP capsule Take 1 capsule (12,000 Units total) by mouth 3 (three) times daily before meals. 270 capsule 3  . lisinopril (PRINIVIL,ZESTRIL) 2.5 MG tablet Take 1 tablet (2.5 mg total) by mouth daily. 90 tablet 3  . Multiple Vitamins-Minerals (CENTRUM SILVER PO) Take 1 tablet by mouth daily.     . Probiotic Product (PROBIOTIC DAILY PO) Take 3 tablets by mouth daily. 3 tabs once a day  with meals.    Marland Kitchen SALINE NA Place 1 spray into both nostrils daily.    . tamsulosin (FLOMAX) 0.4 MG CAPS capsule Take 1 capsule (0.4 mg total) by mouth 2 (two) times daily. 180 capsule 3   No current facility-administered medications for this encounter.    Allergies  Allergen Reactions  . Toujeo Solostar [Insulin Glargine] Other (See Comments)    Shaking, felt bad all over  . Clindamycin Diarrhea  . Other Other (See Comments)    toujeo with perioral tingling only - pt refuses to take further  . Oxycodone-Acetaminophen Diarrhea  . Penicillins Hives    Has patient had a PCN reaction causing immediate rash, facial/tongue/throat swelling, SOB or lightheadedness with hypotension: Yes Has patient had a PCN reaction causing severe rash involving mucus membranes or skin necrosis: no  Has patient had a PCN reaction that required hospitalization No Has patient had a PCN reaction occurring within the last 10 years: No If all of the above answers are "NO", then may proceed with Cephalosporin use.    Social History   Social History  . Marital Status: Married    Spouse Name: N/A  . Number of Children: 3  . Years of Education: N/A   Occupational History  . 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     Social History Main Topics  . Smoking status: Former Smoker    Types: Pipe    Quit date: 03/14/1991  . Smokeless tobacco: Never Used  . Alcohol Use: No  . Drug Use: No  . Sexual Activity: Not Currently   Other Topics Concern  . Not on file   Social History Narrative   [employer] -IT sales professional. married 1954. 2 sons - [place]; 1 daughter 19. 7 grandchildren. retired- Training and development officer.  End of Life Care: DNR, no prolonged intubation (more than 2-3 days), no sustaining tube feeding, no futile or heroic measures. (Provided signed Out of Facility order and unsigned MOST form 6/112/12)    Family History  Problem Relation Age of Onset  . Coronary artery disease Father 8  .  Heart disease Father     fatal MI  . Osteoarthritis Mother 41  . Arthritis Mother   . Coronary artery disease Brother     cabg, avr, pvd with sents  . Heart disease Brother     CABG, PVD  . Peripheral vascular disease Sister 62  . Fibromyalgia Sister   . Arthritis Sister   . Coronary artery disease Sister   . Diabetes Sister   . Cancer Brother     ROS- All systems  are reviewed and negative except as per the HPI above  Physical Exam: Filed Vitals:   09/30/15 1028  BP: 96/82  Pulse: 88  Height: 6\' 2"  (1.88 m)  Weight: 183 lb 9.6 oz (83.28 kg)    GEN- The patient is well appearing, alert and oriented x 3 today.   Head- normocephalic, atraumatic Eyes-  Sclera clear, conjunctiva pink Ears- hearing intact Oropharynx- clear Neck- supple, no JVP Lymph- no cervical lymphadenopathy Lungs- Clear to ausculation bilaterally, normal work of breathing Heart- Regular rate and rhythm, no murmurs, rubs or gallops, PMI not laterally displaced GI- soft, NT, ND, + BS Extremities- no clubbing, cyanosis, or edema MS- no significant deformity or atrophy Skin- no rash or lesion Psych- euthymic mood, full affect Neuro- strength and sensation are intact  EKG- afib v rate 88 bpm, qrs int 84 ms, qtc 464 ms Epic records reviewed  Assessment and Plan:  1.PAF ERAF dccv and failure of tiksoyn/amiodarone He be will rate controlled and left in afib, minimally symptomatic Continue eliquis 2.5 mg a day   2.Hyptotension asymptomatic/stable  F/u 3 months   Butch Penny C. Carroll, [facility] [address] [place], [place] [address] [phone]

## 2015-10-12 ENCOUNTER — Ambulatory Visit: Payer: Medicare Other | Admitting: Nurse Practitioner

## 2015-10-13 ENCOUNTER — Encounter: Payer: Self-pay | Admitting: Internal Medicine

## 2015-10-14 NOTE — Telephone Encounter (Signed)
See pt email for request.

## 2015-10-19 ENCOUNTER — Ambulatory Visit: Payer: Medicare Other | Admitting: Nurse Practitioner

## 2015-10-21 ENCOUNTER — Encounter: Payer: Self-pay | Admitting: Internal Medicine

## 2015-10-21 ENCOUNTER — Ambulatory Visit (INDEPENDENT_AMBULATORY_CARE_PROVIDER_SITE_OTHER): Payer: Medicare Other | Admitting: Internal Medicine

## 2015-10-21 ENCOUNTER — Other Ambulatory Visit (INDEPENDENT_AMBULATORY_CARE_PROVIDER_SITE_OTHER): Payer: Medicare Other

## 2015-10-21 VITALS — BP 98/58 | HR 100 | Temp 97.4°F | Resp 16 | Wt 186.5 lb

## 2015-10-21 DIAGNOSIS — I481 Persistent atrial fibrillation: Secondary | ICD-10-CM

## 2015-10-21 DIAGNOSIS — E118 Type 2 diabetes mellitus with unspecified complications: Secondary | ICD-10-CM

## 2015-10-21 DIAGNOSIS — T464X5A Adverse effect of angiotensin-converting-enzyme inhibitors, initial encounter: Secondary | ICD-10-CM | POA: Insufficient documentation

## 2015-10-21 DIAGNOSIS — E038 Other specified hypothyroidism: Secondary | ICD-10-CM

## 2015-10-21 DIAGNOSIS — Z794 Long term (current) use of insulin: Secondary | ICD-10-CM | POA: Diagnosis not present

## 2015-10-21 DIAGNOSIS — I480 Paroxysmal atrial fibrillation: Secondary | ICD-10-CM | POA: Diagnosis not present

## 2015-10-21 DIAGNOSIS — I2510 Atherosclerotic heart disease of native coronary artery without angina pectoris: Secondary | ICD-10-CM

## 2015-10-21 DIAGNOSIS — I4819 Other persistent atrial fibrillation: Secondary | ICD-10-CM

## 2015-10-21 DIAGNOSIS — J301 Allergic rhinitis due to pollen: Secondary | ICD-10-CM

## 2015-10-21 DIAGNOSIS — R05 Cough: Secondary | ICD-10-CM

## 2015-10-21 DIAGNOSIS — I872 Venous insufficiency (chronic) (peripheral): Secondary | ICD-10-CM

## 2015-10-21 LAB — LIPID PANEL
CHOLESTEROL: 114 mg/dL (ref 0–200)
HDL: 46.2 mg/dL (ref >39.00)
LDL CALC: 56 mg/dL (ref 0–99)
NonHDL: 68.12
TRIGLYCERIDES: 59 mg/dL (ref 0.0–149.0)
Total CHOL/HDL Ratio: 2
VLDL: 11.8 mg/dL (ref 0.0–40.0)

## 2015-10-21 LAB — BASIC METABOLIC PANEL
BUN: 19 mg/dL (ref 6–23)
CALCIUM: 8.9 mg/dL (ref 8.4–10.5)
CO2: 25 meq/L (ref 19–32)
Chloride: 105 mEq/L (ref 96–112)
Creatinine, Ser: 1.06 mg/dL (ref 0.40–1.50)
GFR: 70.01 mL/min (ref >60.00)
GLUCOSE: 124 mg/dL — AB (ref 70–99)
POTASSIUM: 4.5 meq/L (ref 3.5–5.1)
Sodium: 137 mEq/L (ref 135–145)

## 2015-10-21 LAB — TSH: TSH: 7.48 u[IU]/mL — ABNORMAL HIGH (ref 0.35–4.50)

## 2015-10-21 LAB — HEMOGLOBIN A1C: HEMOGLOBIN A1C: 6.5 % (ref 4.6–6.5)

## 2015-10-21 MED ORDER — FUROSEMIDE 20 MG PO TABS: 20.0000 mg | ORAL_TABLET | ORAL | Status: DC

## 2015-10-21 MED ORDER — AZELASTINE HCL 0.1 % NA SOLN: 2.0000 | Freq: Two times a day (BID) | NASAL | Status: AC

## 2015-10-21 NOTE — Patient Instructions (Signed)
Hypertension Hypertension, commonly called high blood pressure, is when the force of blood pumping through your arteries is too strong. Your arteries are the blood vessels that carry blood from your heart throughout your body. A blood pressure reading consists of a higher number over a lower number, such as 110/72. The higher number (systolic) is the pressure inside your arteries when your heart pumps. The lower number (diastolic) is the pressure inside your arteries when your heart relaxes. Ideally you want your blood pressure below 120/80. Hypertension forces your heart to work harder to pump blood. Your arteries may become narrow or stiff. Having untreated or uncontrolled hypertension can cause heart attack, stroke, kidney disease, and other problems. RISK FACTORS Some risk factors for high blood pressure are controllable. Others are not.  Risk factors you cannot control include:   Race. You may be at higher risk if you are African American.  Age. Risk increases with age.  Gender. Men are at higher risk than women before age 45 years. After age 65, women are at higher risk than men. Risk factors you can control include:  Not getting enough exercise or physical activity.  Being overweight.  Getting too much fat, sugar, calories, or salt in your diet.  Drinking too much alcohol. SIGNS AND SYMPTOMS Hypertension does not usually cause signs or symptoms. Extremely high blood pressure (hypertensive crisis) may cause headache, anxiety, shortness of breath, and nosebleed. DIAGNOSIS To check if you have hypertension, your health care provider will measure your blood pressure while you are seated, with your arm held at the level of your heart. It should be measured at least twice using the same arm. Certain conditions can cause a difference in blood pressure between your right and left arms. A blood pressure reading that is higher than normal on one occasion does not mean that you need treatment. If  it is not clear whether you have high blood pressure, you may be asked to return on a different day to have your blood pressure checked again. Or, you may be asked to monitor your blood pressure at home for 1 or more weeks. TREATMENT Treating high blood pressure includes making lifestyle changes and possibly taking medicine. Living a healthy lifestyle can help lower high blood pressure. You may need to change some of your habits. Lifestyle changes may include:  Following the DASH diet. This diet is high in fruits, vegetables, and whole grains. It is low in salt, red meat, and added sugars.  Keep your sodium intake below 2,300 mg per day.  Getting at least 30-45 minutes of aerobic exercise at least 4 times per week.  Losing weight if necessary.  Not smoking.  Limiting alcoholic beverages.  Learning ways to reduce stress. Your health care provider may prescribe medicine if lifestyle changes are not enough to get your blood pressure under control, and if one of the following is true:  You are 18-59 years of age and your systolic blood pressure is above 140.  You are 60 years of age or older, and your systolic blood pressure is above 150.  Your diastolic blood pressure is above 90.  You have diabetes, and your systolic blood pressure is over 140 or your diastolic blood pressure is over 90.  You have kidney disease and your blood pressure is above 140/90.  You have heart disease and your blood pressure is above 140/90. Your personal target blood pressure may vary depending on your medical conditions, your age, and other factors. HOME CARE INSTRUCTIONS    Have your blood pressure rechecked as directed by your health care provider.   Take medicines only as directed by your health care provider. Follow the directions carefully. Blood pressure medicines must be taken as prescribed. The medicine does not work as well when you skip doses. Skipping doses also puts you at risk for  problems.  Do not smoke.   Monitor your blood pressure at home as directed by your health care provider. SEEK MEDICAL CARE IF:   You think you are having a reaction to medicines taken.  You have recurrent headaches or feel dizzy.  You have swelling in your ankles.  You have trouble with your vision. SEEK IMMEDIATE MEDICAL CARE IF:  You develop a severe headache or confusion.  You have unusual weakness, numbness, or feel faint.  You have severe chest or abdominal pain.  You vomit repeatedly.  You have trouble breathing. MAKE SURE YOU:   Understand these instructions.  Will watch your condition.  Will get help right away if you are not doing well or get worse.   This information is not intended to replace advice given to you by your health care provider. Make sure you discuss any questions you have with your health care provider.   Document Released: 11/14/2005 Document Revised: 03/31/2015 Document Reviewed: 09/06/2013 Elsevier Interactive Patient Education 2016 [employer]  

## 2015-10-21 NOTE — Progress Notes (Signed)
Pre visit review using our clinic review tool, if applicable. No additional management support is needed unless otherwise documented below in the visit note. 

## 2015-10-21 NOTE — Progress Notes (Signed)
Subjective:  Patient ID: [name], male    DOB: 1925/03/02  Age: 81 y.o. MRN: [id number]  CC: Hypothyroidism; Hypertension; and Diabetes   HPI NOHL TRZCINSKI presents for f/up and he complains of recurrent episodes of throat clearing, a tickle in his throat, and intermittent NP cough.  Outpatient Prescriptions Prior to Visit  Medication Sig Dispense Refill  . Ascorbic Acid (VITAMIN C) 500 MG tablet Take 500 mg by mouth daily.     . carvedilol (COREG) 6.25 MG tablet Take 1 tablet (6.25 mg total) by mouth 2 (two) times daily. (Patient taking differently: Take 3.125 mg by mouth 2 (two) times daily. ) 180 tablet 3  . [facility]S GLUCOSAMINE-CHONDROITIN PO Take 1 tablet by mouth daily. 1200/600 mg    . ELIQUIS 2.5 MG TABS tablet Take 1 tablet by mouth two  times daily 180 tablet 1  . Emollient (AMLACTIN XL) LOTN Apply 1 application topically daily. Apply lotion to legs    . finasteride (PROSCAR) 5 MG tablet Take 1 tablet (5 mg total) by mouth daily. 90 tablet 3  . insulin aspart (NOVOLOG FLEXPEN) 100 UNIT/ML FlexPen Inject 4-8 Units into the skin 3 (three) times daily with meals. Inject 6 units subcutaneously with breakfast, 4 units with lunch and 8 units with supper    . Insulin Detemir (LEVEMIR FLEXTOUCH) 100 UNIT/ML Pen Inject 6 Units into the skin at bedtime.    . Insulin Pen Needle (B-D UF III MINI PEN NEEDLES) 31G X 5 MM MISC Inject 120 Devices into the skin 4 (four) times daily. Use a new needle for injecting insulin4 times a day 360 each 3  . lipase/protease/amylase (CREON) 12000 UNITS CPEP capsule Take 1 capsule (12,000 Units total) by mouth 3 (three) times daily before meals. 270 capsule 3  . Multiple Vitamins-Minerals (CENTRUM SILVER PO) Take 1 tablet by mouth daily.     . Probiotic Product (PROBIOTIC DAILY PO) Take 3 tablets by mouth daily. 3 tabs once a day with meals.    Marland Kitchen SALINE NA Place 1 spray into both nostrils daily.    . tamsulosin (FLOMAX) 0.4 MG CAPS capsule Take 1 capsule  (0.4 mg total) by mouth 2 (two) times daily. 180 capsule 3  . furosemide (LASIX) 20 MG tablet TAKE 1 TABLET BY MOUTH EVERY OTHER DAY 45 tablet 1  . lisinopril (PRINIVIL,ZESTRIL) 2.5 MG tablet Take 1 tablet (2.5 mg total) by mouth daily. 90 tablet 3   No facility-administered medications prior to visit.    ROS Review of Systems  Constitutional: Negative.  Negative for fever, chills, diaphoresis, appetite change and fatigue.  HENT: Negative.  Negative for congestion, facial swelling, sinus pressure, sore throat, trouble swallowing and voice change.   Eyes: Negative.   Respiratory: Positive for cough. Negative for apnea, choking, chest tightness, shortness of breath, wheezing and stridor.   Cardiovascular: Negative.  Negative for chest pain, palpitations and leg swelling.  Gastrointestinal: Negative.  Negative for nausea, vomiting, abdominal pain, diarrhea, constipation and blood in stool.  Endocrine: Negative.  Negative for polydipsia, polyphagia and polyuria.  Genitourinary: Negative.   Musculoskeletal: Negative.  Negative for myalgias, back pain and arthralgias.  Skin: Negative.  Negative for rash.  Allergic/Immunologic: Negative.   Neurological: Negative.  Negative for dizziness, light-headedness and numbness.  Hematological: Negative.  Negative for adenopathy. Does not bruise/bleed easily.  Psychiatric/Behavioral: Negative.     Objective:  BP 98/58 mmHg  Pulse 100  Temp(Src) 97.4 F (36.3 C) (Oral)  Resp 16  Wt 186 lb 8 oz (84.596 kg)  SpO2 97%  BP Readings from Last 3 Encounters:  10/21/15 98/58  09/30/15 96/82  09/02/15 102/60    Wt Readings from Last 3 Encounters:  10/21/15 186 lb 8 oz (84.596 kg)  09/30/15 183 lb 9.6 oz (83.28 kg)  09/02/15 182 lb 9.6 oz (82.827 kg)    Physical Exam  Constitutional: He is oriented to person, place, and time. No distress.  HENT:  Mouth/Throat: Oropharynx is clear and moist. No oropharyngeal exudate.  Eyes: Conjunctivae are  normal. Right eye exhibits no discharge. Left eye exhibits no discharge. No scleral icterus.  Neck: Normal range of motion. Neck supple. No JVD present. No tracheal deviation present. No thyromegaly present.  Cardiovascular: Normal rate and intact distal pulses.  An irregularly irregular rhythm present. Exam reveals no gallop and no friction rub.   Murmur heard.  Systolic murmur is present with a grade of 2/6  Pulmonary/Chest: Effort normal and breath sounds normal. No stridor. No respiratory distress. He has no wheezes. He has no rales. He exhibits no tenderness.  Abdominal: Soft. Bowel sounds are normal. He exhibits no distension and no mass. There is no tenderness. There is no rebound and no guarding.  Musculoskeletal: Normal range of motion. He exhibits no edema or tenderness.  Lymphadenopathy:    He has no cervical adenopathy.  Neurological: He is oriented to person, place, and time.  Skin: Skin is warm and dry. No rash noted. He is not diaphoretic. No erythema. No pallor.    Lab Results  Component Value Date   WBC 5.5 08/06/2015   HGB 11.9* 08/06/2015   HCT 36.1* 08/06/2015   PLT 147* 08/06/2015   GLUCOSE 124* 10/21/2015   CHOL 114 10/21/2015   TRIG 59.0 10/21/2015   HDL 46.20 10/21/2015   LDLCALC 56 10/21/2015   ALT 22 04/06/2015   AST 27 04/06/2015   NA 137 10/21/2015   K 4.5 10/21/2015   CL 105 10/21/2015   CREATININE 1.06 10/21/2015   BUN 19 10/21/2015   CO2 25 10/21/2015   TSH 7.48* 10/21/2015   PSA 2.28 01/04/2010   INR 1.41 07/07/2013   HGBA1C 6.5 10/21/2015   MICROALBUR <0.7 02/25/2015    No results found.  Assessment & Plan:   Simmie was seen today for hypothyroidism, hypertension and diabetes.  Diagnoses and all orders for this visit:  Coronary artery disease involving native coronary artery of native heart without angina pectoris- he has not had any angina or other s/s, will cont to treat his risk factors -     Lipid panel; Future  Paroxysmal atrial  fibrillation ([place])- -     TSH; Future  Other specified hypothyroidism- his Tsh is very mildly elevated but he does not have any s/s that need to be treated, earlier this year he took a low dose synthroid and he reports that his A fib worsened, will monitor him with no thyroid replacement for now -     TSH; Future  Type 2 diabetes mellitus with complication, with long-term current use of insulin ([place])- his blood sugars are well controlled -     Hemoglobin A1c; Future -     Basic metabolic panel; Future  Persistent atrial fibrillation (HCC) - his rate control is good, continue eliquis  Venous insufficiency of both lower extremities -     furosemide (LASIX) 20 MG tablet; Take 1 tablet (20 mg total) by mouth every other day.  Non-seasonal allergic rhinitis due to pollen -  azelastine (ASTELIN) 0.1 % nasal spray; Place 2 sprays into both nostrils 2 (two) times daily. Use in each nostril as directed  Cough due to ACE inhibitor- will discontinue lisinopril  Other orders -     Discontinue: furosemide (LASIX) 20 MG tablet; Take 1 tablet (20 mg total) by mouth every other day.   I have discontinued Mr. Hearns lisinopril. I am also having him start on azelastine. Additionally, I am having him maintain his AMLACTIN XL, Multiple Vitamins-Minerals (CENTRUM SILVER PO), vitamin C, CVS GLUCOSAMINE-CHONDROITIN PO, Probiotic Product (PROBIOTIC DAILY PO), SALINE NA, lipase/protease/amylase, ELIQUIS, Insulin Pen Needle, tamsulosin, finasteride, Insulin Detemir, insulin aspart, carvedilol, and furosemide.  Meds ordered this encounter  Medications  . DISCONTD: furosemide (LASIX) 20 MG tablet    Sig: Take 1 tablet (20 mg total) by mouth every other day.    Dispense:  45 tablet    Refill:  3    **Patient requests 90 days supply**  . furosemide (LASIX) 20 MG tablet    Sig: Take 1 tablet (20 mg total) by mouth every other day.    Dispense:  45 tablet    Refill:  3    **Patient requests 90 days  supply**  . azelastine (ASTELIN) 0.1 % nasal spray    Sig: Place 2 sprays into both nostrils 2 (two) times daily. Use in each nostril as directed    Dispense:  90 mL    Refill:  3     Follow-up: Return in about 3 months (around 01/21/2016).  [name], MD

## 2015-10-29 DIAGNOSIS — J90 Pleural effusion, not elsewhere classified: Secondary | ICD-10-CM

## 2015-10-29 HISTORY — DX: Pleural effusion, not elsewhere classified: J90

## 2015-11-03 ENCOUNTER — Encounter: Payer: Self-pay | Admitting: Internal Medicine

## 2015-11-04 ENCOUNTER — Other Ambulatory Visit: Payer: Self-pay

## 2015-11-04 ENCOUNTER — Inpatient Hospital Stay (HOSPITAL_COMMUNITY): Payer: Medicare Other

## 2015-11-04 ENCOUNTER — Encounter: Payer: Self-pay | Admitting: Internal Medicine

## 2015-11-04 ENCOUNTER — Ambulatory Visit (INDEPENDENT_AMBULATORY_CARE_PROVIDER_SITE_OTHER): Payer: Medicare Other | Admitting: Internal Medicine

## 2015-11-04 ENCOUNTER — Other Ambulatory Visit (INDEPENDENT_AMBULATORY_CARE_PROVIDER_SITE_OTHER): Payer: Medicare Other

## 2015-11-04 ENCOUNTER — Inpatient Hospital Stay (HOSPITAL_COMMUNITY)
Admission: EM | Admit: 2015-11-04 | Discharge: 2015-11-12 | DRG: 286 | Disposition: A | Discharge status: Home / Self Care | Payer: Medicare Other | Attending: Internal Medicine | Admitting: Internal Medicine

## 2015-11-04 ENCOUNTER — Ambulatory Visit (INDEPENDENT_AMBULATORY_CARE_PROVIDER_SITE_OTHER)
Admission: RE | Admit: 2015-11-04 | Discharge: 2015-11-04 | Disposition: A | Discharge status: Home / Self Care | Payer: Medicare Other | Source: Ambulatory Visit | Attending: Internal Medicine | Admitting: Internal Medicine

## 2015-11-04 ENCOUNTER — Encounter (HOSPITAL_COMMUNITY): Payer: Self-pay | Admitting: *Deleted

## 2015-11-04 VITALS — BP 110/68 | HR 93 | Temp 97.6°F | Resp 16 | Ht 74.0 in | Wt 189.0 lb

## 2015-11-04 DIAGNOSIS — I5021 Acute systolic (congestive) heart failure: Secondary | ICD-10-CM | POA: Diagnosis not present

## 2015-11-04 DIAGNOSIS — J90 Pleural effusion, not elsewhere classified: Secondary | ICD-10-CM | POA: Diagnosis present

## 2015-11-04 DIAGNOSIS — I480 Paroxysmal atrial fibrillation: Secondary | ICD-10-CM | POA: Diagnosis present

## 2015-11-04 DIAGNOSIS — R0600 Dyspnea, unspecified: Secondary | ICD-10-CM | POA: Diagnosis not present

## 2015-11-04 DIAGNOSIS — N184 Chronic kidney disease, stage 4 (severe): Secondary | ICD-10-CM

## 2015-11-04 DIAGNOSIS — N401 Enlarged prostate with lower urinary tract symptoms: Secondary | ICD-10-CM | POA: Diagnosis present

## 2015-11-04 DIAGNOSIS — I4819 Other persistent atrial fibrillation: Secondary | ICD-10-CM | POA: Diagnosis present

## 2015-11-04 DIAGNOSIS — R059 Cough, unspecified: Secondary | ICD-10-CM

## 2015-11-04 DIAGNOSIS — Z885 Allergy status to narcotic agent status: Secondary | ICD-10-CM | POA: Diagnosis not present

## 2015-11-04 DIAGNOSIS — R0609 Other forms of dyspnea: Secondary | ICD-10-CM | POA: Diagnosis not present

## 2015-11-04 DIAGNOSIS — R0602 Shortness of breath: Secondary | ICD-10-CM | POA: Insufficient documentation

## 2015-11-04 DIAGNOSIS — I2510 Atherosclerotic heart disease of native coronary artery without angina pectoris: Secondary | ICD-10-CM | POA: Diagnosis present

## 2015-11-04 DIAGNOSIS — R05 Cough: Secondary | ICD-10-CM

## 2015-11-04 DIAGNOSIS — Z8249 Family history of ischemic heart disease and other diseases of the circulatory system: Secondary | ICD-10-CM | POA: Diagnosis not present

## 2015-11-04 DIAGNOSIS — Z79899 Other long term (current) drug therapy: Secondary | ICD-10-CM

## 2015-11-04 DIAGNOSIS — R338 Other retention of urine: Secondary | ICD-10-CM | POA: Diagnosis present

## 2015-11-04 DIAGNOSIS — Z881 Allergy status to other antibiotic agents status: Secondary | ICD-10-CM | POA: Diagnosis not present

## 2015-11-04 DIAGNOSIS — E039 Hypothyroidism, unspecified: Secondary | ICD-10-CM | POA: Diagnosis present

## 2015-11-04 DIAGNOSIS — I481 Persistent atrial fibrillation: Secondary | ICD-10-CM | POA: Diagnosis present

## 2015-11-04 DIAGNOSIS — Z8509 Personal history of malignant neoplasm of other digestive organs: Secondary | ICD-10-CM | POA: Diagnosis not present

## 2015-11-04 DIAGNOSIS — N179 Acute kidney failure, unspecified: Secondary | ICD-10-CM | POA: Diagnosis present

## 2015-11-04 DIAGNOSIS — R609 Edema, unspecified: Secondary | ICD-10-CM | POA: Diagnosis not present

## 2015-11-04 DIAGNOSIS — N182 Chronic kidney disease, stage 2 (mild): Secondary | ICD-10-CM | POA: Diagnosis present

## 2015-11-04 DIAGNOSIS — K219 Gastro-esophageal reflux disease without esophagitis: Secondary | ICD-10-CM | POA: Diagnosis present

## 2015-11-04 DIAGNOSIS — N289 Disorder of kidney and ureter, unspecified: Secondary | ICD-10-CM | POA: Diagnosis present

## 2015-11-04 DIAGNOSIS — Z87891 Personal history of nicotine dependence: Secondary | ICD-10-CM | POA: Diagnosis not present

## 2015-11-04 DIAGNOSIS — I482 Chronic atrial fibrillation: Secondary | ICD-10-CM | POA: Diagnosis present

## 2015-11-04 DIAGNOSIS — E785 Hyperlipidemia, unspecified: Secondary | ICD-10-CM | POA: Diagnosis present

## 2015-11-04 DIAGNOSIS — I130 Hypertensive heart and chronic kidney disease with heart failure and stage 1 through stage 4 chronic kidney disease, or unspecified chronic kidney disease: Secondary | ICD-10-CM | POA: Diagnosis present

## 2015-11-04 DIAGNOSIS — I5023 Acute on chronic systolic (congestive) heart failure: Secondary | ICD-10-CM | POA: Diagnosis present

## 2015-11-04 DIAGNOSIS — Z9889 Other specified postprocedural states: Secondary | ICD-10-CM | POA: Diagnosis not present

## 2015-11-04 DIAGNOSIS — I429 Cardiomyopathy, unspecified: Secondary | ICD-10-CM | POA: Diagnosis present

## 2015-11-04 DIAGNOSIS — D638 Anemia in other chronic diseases classified elsewhere: Secondary | ICD-10-CM | POA: Diagnosis not present

## 2015-11-04 DIAGNOSIS — E11649 Type 2 diabetes mellitus with hypoglycemia without coma: Secondary | ICD-10-CM | POA: Diagnosis present

## 2015-11-04 DIAGNOSIS — Z794 Long term (current) use of insulin: Secondary | ICD-10-CM

## 2015-11-04 DIAGNOSIS — Z7901 Long term (current) use of anticoagulants: Secondary | ICD-10-CM

## 2015-11-04 DIAGNOSIS — Z90411 Acquired partial absence of pancreas: Secondary | ICD-10-CM

## 2015-11-04 DIAGNOSIS — I472 Ventricular tachycardia: Secondary | ICD-10-CM | POA: Diagnosis not present

## 2015-11-04 DIAGNOSIS — R079 Chest pain, unspecified: Secondary | ICD-10-CM

## 2015-11-04 DIAGNOSIS — I509 Heart failure, unspecified: Secondary | ICD-10-CM

## 2015-11-04 DIAGNOSIS — I872 Venous insufficiency (chronic) (peripheral): Secondary | ICD-10-CM

## 2015-11-04 DIAGNOSIS — E118 Type 2 diabetes mellitus with unspecified complications: Secondary | ICD-10-CM | POA: Diagnosis present

## 2015-11-04 DIAGNOSIS — Z880 Allergy status to penicillin: Secondary | ICD-10-CM | POA: Diagnosis not present

## 2015-11-04 DIAGNOSIS — E1122 Type 2 diabetes mellitus with diabetic chronic kidney disease: Secondary | ICD-10-CM | POA: Diagnosis present

## 2015-11-04 DIAGNOSIS — J189 Pneumonia, unspecified organism: Secondary | ICD-10-CM | POA: Insufficient documentation

## 2015-11-04 DIAGNOSIS — Z888 Allergy status to other drugs, medicaments and biological substances status: Secondary | ICD-10-CM | POA: Diagnosis not present

## 2015-11-04 DIAGNOSIS — K8681 Exocrine pancreatic insufficiency: Secondary | ICD-10-CM | POA: Diagnosis present

## 2015-11-04 HISTORY — DX: Chronic kidney disease, stage 2 (mild): N18.2

## 2015-11-04 HISTORY — DX: Acute on chronic systolic (congestive) heart failure: I50.23

## 2015-11-04 HISTORY — DX: Pleural effusion, not elsewhere classified: J90

## 2015-11-04 HISTORY — DX: Other persistent atrial fibrillation: I48.19

## 2015-11-04 LAB — BODY FLUID CELL COUNT WITH DIFFERENTIAL
EOS FL: 7 %
LYMPHS FL: 82 %
Monocyte-Macrophage-Serous Fluid: 7 % — ABNORMAL LOW (ref 50–90)
NEUTROPHIL FLUID: 4 % (ref 0–25)
Total Nucleated Cell Count, Fluid: 723 cu mm (ref 0–1000)

## 2015-11-04 LAB — CBC WITH DIFFERENTIAL/PLATELET
BASOS ABS: 0 10*3/uL (ref 0.0–0.1)
BASOS PCT: 0.6 % (ref 0.0–3.0)
BASOS PCT: 1 %
Basophils Absolute: 0 10*3/uL (ref 0.0–0.1)
EOS PCT: 6.4 % — AB (ref 0.0–5.0)
EOS PCT: 5 %
Eosinophils Absolute: 0.5 10*3/uL (ref 0.0–0.7)
Eosinophils Absolute: 0.4 10*3/uL (ref 0.0–0.7)
HCT: 39.5 % (ref 39.0–52.0)
HCT: 35.6 % — ABNORMAL LOW (ref 39.0–52.0)
Hemoglobin: 12.8 g/dL — ABNORMAL LOW (ref 13.0–17.0)
Hemoglobin: 11.8 g/dL — ABNORMAL LOW (ref 13.0–17.0)
LYMPHS ABS: 1 10*3/uL (ref 0.7–4.0)
LYMPHS PCT: 16 %
Lymphocytes Relative: 13.4 % (ref 12.0–46.0)
Lymphs Abs: 1.1 10*3/uL (ref 0.7–4.0)
MCH: 31.7 pg (ref 26.0–34.0)
MCHC: 32.5 g/dL (ref 30.0–36.0)
MCHC: 33.1 g/dL (ref 30.0–36.0)
MCV: 96.9 fl (ref 78.0–100.0)
MCV: 95.7 fL (ref 78.0–100.0)
MONOS PCT: 14 % — AB (ref 3.0–12.0)
Monocytes Absolute: 1 10*3/uL (ref 0.1–1.0)
Monocytes Absolute: 1.1 10*3/uL — ABNORMAL HIGH (ref 0.1–1.0)
Monocytes Relative: 15 %
NEUTROS ABS: 4.8 10*3/uL (ref 1.4–7.7)
NEUTROS PCT: 65.6 % (ref 43.0–77.0)
Neutro Abs: 4.5 10*3/uL (ref 1.7–7.7)
Neutrophils Relative %: 63 %
PLATELETS: 167 10*3/uL (ref 150.0–400.0)
PLATELETS: 151 10*3/uL (ref 150–400)
RBC: 4.07 Mil/uL — ABNORMAL LOW (ref 4.22–5.81)
RBC: 3.72 MIL/uL — AB (ref 4.22–5.81)
RDW: 14.8 % (ref 11.5–15.5)
RDW: 13.8 % (ref 11.5–15.5)
WBC: 7.4 10*3/uL (ref 4.0–10.5)
WBC: 7.1 10*3/uL (ref 4.0–10.5)

## 2015-11-04 LAB — BASIC METABOLIC PANEL
BUN: 20 mg/dL (ref 6–23)
CO2: 27 meq/L (ref 19–32)
Calcium: 9 mg/dL (ref 8.4–10.5)
Chloride: 103 mEq/L (ref 96–112)
Creatinine, Ser: 1.16 mg/dL (ref 0.40–1.50)
GFR: 63.09 mL/min (ref >60.00)
GLUCOSE: 140 mg/dL — AB (ref 70–99)
POTASSIUM: 5.1 meq/L (ref 3.5–5.1)
SODIUM: 137 meq/L (ref 135–145)

## 2015-11-04 LAB — CK TOTAL AND CKMB (NOT AT ARMC)
CK, MB: 2.5 ng/mL (ref 0.0–5.0)
RELATIVE INDEX: 6 — AB (ref 0.0–4.0)
Total CK: 42 U/L (ref 7–232)

## 2015-11-04 LAB — COMPREHENSIVE METABOLIC PANEL
ALBUMIN: 3 g/dL — AB (ref 3.5–5.0)
ALT: 18 U/L (ref 17–63)
ANION GAP: 5 (ref 5–15)
AST: 28 U/L (ref 15–41)
Alkaline Phosphatase: 123 U/L (ref 38–126)
BILIRUBIN TOTAL: 0.8 mg/dL (ref 0.3–1.2)
BUN: 18 mg/dL (ref 6–20)
CHLORIDE: 104 mmol/L (ref 101–111)
CO2: 26 mmol/L (ref 22–32)
Calcium: 8.7 mg/dL — ABNORMAL LOW (ref 8.9–10.3)
Creatinine, Ser: 1.16 mg/dL (ref 0.61–1.24)
GFR, EST NON AFRICAN AMERICAN: 54 mL/min — AB (ref >60)
Glucose, Bld: 115 mg/dL — ABNORMAL HIGH (ref 65–99)
POTASSIUM: 4.5 mmol/L (ref 3.5–5.1)
SODIUM: 135 mmol/L (ref 135–145)
TOTAL PROTEIN: 6 g/dL — AB (ref 6.5–8.1)

## 2015-11-04 LAB — MAGNESIUM: MAGNESIUM: 2 mg/dL (ref 1.7–2.4)

## 2015-11-04 LAB — I-STAT TROPONIN, ED: TROPONIN I, POC: 0.02 ng/mL (ref 0.00–0.08)

## 2015-11-04 LAB — CBG MONITORING, ED: GLUCOSE-CAPILLARY: 127 mg/dL — AB (ref 65–99)

## 2015-11-04 LAB — TROPONIN I: TNIDX: 0.03 ug/l (ref 0.00–0.06)

## 2015-11-04 LAB — PROTEIN, BODY FLUID: Total protein, fluid: <3 g/dL

## 2015-11-04 LAB — GLUCOSE, CAPILLARY: GLUCOSE-CAPILLARY: 100 mg/dL — AB (ref 65–99)

## 2015-11-04 LAB — GRAM STAIN

## 2015-11-04 LAB — BRAIN NATRIURETIC PEPTIDE
B NATRIURETIC PEPTIDE 5: 469.1 pg/mL — AB (ref 0.0–100.0)
Pro B Natriuretic peptide (BNP): 504 pg/mL — ABNORMAL HIGH (ref 0.0–100.0)

## 2015-11-04 LAB — D-DIMER, QUANTITATIVE (NOT AT ARMC): D DIMER QUANT: 0.63 ug{FEU}/mL — AB (ref 0.00–0.48)

## 2015-11-04 LAB — TSH: TSH: 6.858 u[IU]/mL — ABNORMAL HIGH (ref 0.350–4.500)

## 2015-11-04 LAB — LACTATE DEHYDROGENASE, PLEURAL OR PERITONEAL FLUID: LD FL: 53 U/L — AB (ref 3–23)

## 2015-11-04 MED ORDER — LIDOCAINE-EPINEPHRINE 1 %-1:100000 IJ SOLN
INTRAMUSCULAR | Status: AC
  Filled 2015-11-04: qty 1

## 2015-11-04 MED ORDER — INSULIN ASPART 100 UNIT/ML ~~LOC~~ SOLN
3.0000 [IU] | Freq: Three times a day (TID) | SUBCUTANEOUS | Status: DC
  Administered 2015-11-05 – 2015-11-12 (×20): 3 [IU] via SUBCUTANEOUS

## 2015-11-04 MED ORDER — SODIUM CHLORIDE 0.9 % IV SOLN
250.0000 mL | INTRAVENOUS | Status: DC | PRN
  Administered 2015-11-04: 250 mL via INTRAVENOUS

## 2015-11-04 MED ORDER — ACETAMINOPHEN 325 MG PO TABS
650.0000 mg | ORAL_TABLET | ORAL | Status: DC | PRN
  Filled 2015-11-04: qty 2

## 2015-11-04 MED ORDER — TAMSULOSIN HCL 0.4 MG PO CAPS
0.4000 mg | ORAL_CAPSULE | Freq: Two times a day (BID) | ORAL | Status: DC
  Administered 2015-11-04 – 2015-11-12 (×15): 0.4 mg via ORAL
  Filled 2015-11-04 (×15): qty 1

## 2015-11-04 MED ORDER — ADULT MULTIVITAMIN W/MINERALS CH
1.0000 | ORAL_TABLET | Freq: Every day | ORAL | Status: DC
  Administered 2015-11-05 – 2015-11-12 (×7): 1 via ORAL
  Filled 2015-11-04 (×7): qty 1

## 2015-11-04 MED ORDER — ONDANSETRON HCL 4 MG/2ML IJ SOLN: 4.0000 mg | Freq: Four times a day (QID) | INTRAMUSCULAR | Status: DC | PRN

## 2015-11-04 MED ORDER — CARVEDILOL 6.25 MG PO TABS
6.2500 mg | ORAL_TABLET | Freq: Two times a day (BID) | ORAL | Status: DC
  Administered 2015-11-04 – 2015-11-06 (×2): 6.25 mg via ORAL
  Filled 2015-11-04 (×4): qty 1

## 2015-11-04 MED ORDER — SALINE SPRAY 0.65 % NA SOLN
1.0000 | Freq: Two times a day (BID) | NASAL | Status: DC
  Administered 2015-11-04 – 2015-11-11 (×4): 1 via NASAL
  Filled 2015-11-04: qty 44

## 2015-11-04 MED ORDER — APIXABAN 2.5 MG PO TABS
2.5000 mg | ORAL_TABLET | Freq: Two times a day (BID) | ORAL | Status: DC
  Administered 2015-11-04 – 2015-11-06 (×4): 2.5 mg via ORAL
  Filled 2015-11-04 (×4): qty 1

## 2015-11-04 MED ORDER — FUROSEMIDE 20 MG PO TABS: 20.0000 mg | ORAL_TABLET | ORAL | Status: DC

## 2015-11-04 MED ORDER — CETAPHIL MOISTURIZING EX LOTN
TOPICAL_LOTION | Freq: Every day | CUTANEOUS | Status: DC
  Administered 2015-11-05 – 2015-11-11 (×4): via TOPICAL
  Filled 2015-11-04: qty 473

## 2015-11-04 MED ORDER — PROBIOTIC DAILY PO CAPS: ORAL_CAPSULE | Freq: Every day | ORAL | Status: DC

## 2015-11-04 MED ORDER — PANCRELIPASE (LIP-PROT-AMYL) 12000-38000 UNITS PO CPEP
12000.0000 [IU] | ORAL_CAPSULE | Freq: Three times a day (TID) | ORAL | Status: DC
  Administered 2015-11-04 – 2015-11-12 (×23): 12000 [IU] via ORAL
  Filled 2015-11-04 (×24): qty 1

## 2015-11-04 MED ORDER — FUROSEMIDE 20 MG PO TABS
20.0000 mg | ORAL_TABLET | Freq: Every day | ORAL | Status: DC
  Administered 2015-11-05: 20 mg via ORAL
  Filled 2015-11-04: qty 1

## 2015-11-04 MED ORDER — AMLACTIN XL EX LOTN: 1.0000 "application " | TOPICAL_LOTION | Freq: Every day | CUTANEOUS | Status: DC

## 2015-11-04 MED ORDER — SODIUM CHLORIDE 0.9 % IJ SOLN: 3.0000 mL | INTRAMUSCULAR | Status: DC | PRN

## 2015-11-04 MED ORDER — INSULIN DETEMIR 100 UNIT/ML FLEXPEN: 5.0000 [IU] | PEN_INJECTOR | Freq: Every day | SUBCUTANEOUS | Status: DC

## 2015-11-04 MED ORDER — RISAQUAD PO CAPS
1.0000 | ORAL_CAPSULE | Freq: Every day | ORAL | Status: DC
  Administered 2015-11-05 – 2015-11-07 (×3): 1 via ORAL
  Filled 2015-11-04 (×4): qty 1

## 2015-11-04 MED ORDER — SODIUM CHLORIDE 0.9 % IJ SOLN
3.0000 mL | Freq: Two times a day (BID) | INTRAMUSCULAR | Status: DC
  Administered 2015-11-05 – 2015-11-10 (×10): 3 mL via INTRAVENOUS

## 2015-11-04 MED ORDER — FUROSEMIDE 10 MG/ML IJ SOLN
20.0000 mg | Freq: Once | INTRAMUSCULAR | Status: AC
  Administered 2015-11-04: 20 mg via INTRAVENOUS
  Filled 2015-11-04: qty 2

## 2015-11-04 MED ORDER — FINASTERIDE 5 MG PO TABS
5.0000 mg | ORAL_TABLET | Freq: Every day | ORAL | Status: DC
  Administered 2015-11-05 – 2015-11-12 (×7): 5 mg via ORAL
  Filled 2015-11-04 (×8): qty 1

## 2015-11-04 MED ORDER — AZELASTINE HCL 0.1 % NA SOLN
2.0000 | Freq: Two times a day (BID) | NASAL | Status: DC
  Administered 2015-11-04 – 2015-11-12 (×15): 2 via NASAL
  Filled 2015-11-04 (×2): qty 30

## 2015-11-04 MED ORDER — INSULIN DETEMIR 100 UNIT/ML ~~LOC~~ SOLN
5.0000 [IU] | Freq: Every day | SUBCUTANEOUS | Status: DC
  Administered 2015-11-04 – 2015-11-05 (×2): 5 [IU] via SUBCUTANEOUS
  Filled 2015-11-04 (×3): qty 0.05

## 2015-11-04 MED ORDER — DILTIAZEM HCL 25 MG/5ML IV SOLN
5.0000 mg | Freq: Four times a day (QID) | INTRAVENOUS | Status: DC | PRN
  Administered 2015-11-04 – 2015-11-07 (×3): 5 mg via INTRAVENOUS
  Filled 2015-11-04 (×6): qty 5

## 2015-11-04 MED ORDER — HYDROCOD POLST-CPM POLST ER 10-8 MG/5ML PO SUER
5.0000 mL | Freq: Once | ORAL | Status: AC
  Administered 2015-11-04: 5 mL via ORAL

## 2015-11-04 MED ORDER — SODIUM CHLORIDE 0.9 % IV BOLUS (SEPSIS)
500.0000 mL | Freq: Once | INTRAVENOUS | Status: AC
  Administered 2015-11-04: 500 mL via INTRAVENOUS

## 2015-11-04 MED ORDER — FUROSEMIDE 10 MG/ML IJ SOLN: 20.0000 mg | Freq: Two times a day (BID) | INTRAMUSCULAR | Status: DC

## 2015-11-04 MED ORDER — INSULIN ASPART 100 UNIT/ML ~~LOC~~ SOLN
0.0000 [IU] | Freq: Three times a day (TID) | SUBCUTANEOUS | Status: DC
  Administered 2015-11-04 – 2015-11-07 (×3): 1 [IU] via SUBCUTANEOUS
  Administered 2015-11-07 – 2015-11-08 (×2): 2 [IU] via SUBCUTANEOUS
  Administered 2015-11-08: 1 [IU] via SUBCUTANEOUS
  Administered 2015-11-09: 2 [IU] via SUBCUTANEOUS
  Administered 2015-11-09: 1 [IU] via SUBCUTANEOUS
  Administered 2015-11-09: 2 [IU] via SUBCUTANEOUS
  Administered 2015-11-10 (×2): 1 [IU] via SUBCUTANEOUS
  Administered 2015-11-10: 2 [IU] via SUBCUTANEOUS
  Administered 2015-11-11: 1 [IU] via SUBCUTANEOUS
  Administered 2015-11-11: 3 [IU] via SUBCUTANEOUS
  Administered 2015-11-12 (×2): 1 [IU] via SUBCUTANEOUS
  Filled 2015-11-04: qty 1

## 2015-11-04 MED ORDER — CENTRUM SILVER PO TABS: ORAL_TABLET | Freq: Every day | ORAL | Status: DC

## 2015-11-04 NOTE — Patient Instructions (Signed)

## 2015-11-04 NOTE — ED Notes (Signed)
Carb mod/heart healthy dinner ordered

## 2015-11-04 NOTE — ED Notes (Signed)
This RN called pharmacy to determine when the medications that are due would be sent and was told that the meds would need to be verified by pharmacy and then they would be sent.

## 2015-11-04 NOTE — H&P (Signed)
[facility] History and Physical                                                                                    [name], is a 81 y.o. male  MRN: [id number]   DOB - [birth year]  Admit Date - 11/04/2015  Outpatient Primary MD for the patient is Casey Calico, MD  Referring Physician:  Dr. Oleta Mouse, EDP  Chief Complaint:   Chief Complaint  Patient presents with  . Shortness of Breath  . Atrial Fibrillation     HPI  [name]  is a 81 y.o. male, with permanent atrial fibrillation on Eliquis, diabetes mellitus, seizures, and history of Whipple procedure who presents to the emergency department with shortness of breath. [name] saw his primary care physician this morning for cough and shortness of breath and was sent to the emergency department after an x-ray showed a large right pleural effusion. [name] reports that he has been feeling fatigued and badly for about the past month. But over the last week he has become much worse. He is no longer able to walk his daily 1/2 mile. He is unable to sleep. He has a dry cough but occasionally brings up frothy sputum. He also coughs when he eats or drinks liquids, +orthopnea, +PND, +insomnia.  The patient reports he was taken off of amiodarone approximately 3 mos ago, and was told by his cardiologist, Dr. Caryl Comes, that his afib was going to be permanent.  Review of Systems  Constitutional: Positive for malaise/fatigue.  HENT: Positive for congestion. Negative for sore throat.   Eyes: Negative.   Respiratory: Positive for cough and shortness of breath. Negative for hemoptysis and stridor.   Cardiovascular: Positive for palpitations, orthopnea, leg swelling and PND. Negative for chest pain and claudication.  Gastrointestinal: Negative.   Genitourinary: Negative.   Musculoskeletal: Negative.   Skin: Negative.   Neurological: Positive for weakness.  Endo/Heme/Allergies: Negative.   Psychiatric/Behavioral: Negative.      Past Medical  History  Past Medical History  Diagnosis Date  . Acid reflux disease     history of  . H/O: GI bleed   . Paroxysmal atrial fibrillation (HCC)     chronic anticoag; tikosyn  . Benign prostatic hypertrophy     history of  . Hepatic damage march 2009    retained hepatic stone , intrahepatic duct catheter  . Diabetes mellitus     insulin dep  . CAD (coronary artery disease)   . Hypertension   . 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 Seizures ([place])   . Ventricular tachycardia ? Tikosyn proarrhtyhmia   . Cataracts, bilateral     Past Surgical History  Procedure Laterality Date  . Cardioversion    . Cholecystectomy      history of  . Partial gastrectomy      history of  . Shoulder surgery      left shoulder repair with 2 pens inserted  . Whipple procedure  operation  . Sarcoma removal from rigth tricep    . Inguinal hernia repair      inguinal herniorrhaphy, right... history of  . Appendectomy      history of  . Cardioversion  03/07/2012    Procedure: CARDIOVERSION;  Surgeon: Deboraha Sprang, MD;  Location: [facility];  Service: Cardiovascular;  Laterality: N/A;  . Cardioversion N/A 08/10/2015    Procedure: CARDIOVERSION;  Surgeon: Thayer Headings, MD;  Location: [facility] ENDOSCOPY;  Service: Cardiovascular;  Laterality: N/A;      Social History Social History  Substance Use Topics  . Smoking status: Former Smoker    Types: Pipe    Quit date: 03/14/1991  . Smokeless tobacco: Never Used  . Alcohol Use: No    Family History Family History  Problem Relation Age of Onset  . Coronary artery disease Father 37  . Heart disease Father     fatal MI  . Osteoarthritis Mother 59  . Arthritis Mother   . Coronary artery disease Brother     cabg, avr, pvd with sents  . Heart disease Brother     CABG, PVD  . Peripheral vascular disease  Sister 62  . Fibromyalgia Sister   . Arthritis Sister   . Coronary artery disease Sister   . Diabetes Sister   . Cancer Brother     Prior to Admission medications   Medication Sig Start Date End Date Taking? Authorizing Provider  Ascorbic Acid (VITAMIN C) 500 MG tablet Take 500 mg by mouth daily.    Yes Historical Provider, MD  carvedilol (COREG) 6.25 MG tablet Take 1 tablet (6.25 mg total) by mouth 2 (two) times daily. 09/02/15  Yes Deboraha Sprang, MD  [facility]S GLUCOSAMINE-CHONDROITIN PO Take 1 tablet by mouth daily. 1200/600 mg   Yes Historical Provider, MD  ELIQUIS 2.5 MG TABS tablet Take 1 tablet by mouth two  times daily 02/20/15  Yes Deboraha Sprang, MD  Emollient (AMLACTIN XL) LOTN Apply 1 application topically daily. Apply lotion to legs   Yes Historical Provider, MD  finasteride (PROSCAR) 5 MG tablet Take 1 tablet (5 mg total) by mouth daily. 06/10/15  Yes Janith Lima, MD  furosemide (LASIX) 20 MG tablet Take 1 tablet (20 mg total) by mouth every other day. 10/21/15  Yes Janith Lima, MD  insulin aspart (NOVOLOG FLEXPEN) 100 UNIT/ML FlexPen Inject 4-8 Units into the skin 3 (three) times daily with meals. Inject 5 units subcutaneously with breakfast, 3 units with lunch and 5 units with supper   Yes Historical Provider, MD  Insulin Detemir (LEVEMIR FLEXTOUCH) 100 UNIT/ML Pen Inject 5 Units into the skin at bedtime.    Yes Historical Provider, MD  lipase/protease/amylase (CREON) 12000 UNITS CPEP capsule Take 1 capsule (12,000 Units total) by mouth 3 (three) times daily before meals. 10/27/14  Yes Janith Lima, MD  Multiple Vitamins-Minerals (CENTRUM SILVER PO) Take 1 tablet by mouth daily.    Yes Historical Provider, MD  Probiotic Product (PROBIOTIC DAILY PO) Take 3 tablets by mouth daily. 3 tabs once a day with meals.   Yes Historical Provider, MD  SALINE NA Place 1 spray into both nostrils 2 (two) times daily.    Yes Historical Provider, MD  tamsulosin (FLOMAX) 0.4 MG CAPS capsule Take 1  capsule (0.4 mg total) by mouth 2 (two) times daily. 06/10/15  Yes Janith Lima, MD  azelastine (ASTELIN) 0.1 % nasal spray Place 2 sprays into both nostrils 2 (two) times daily.  Use in each nostril as directed 10/21/15   Janith Lima, MD  Insulin Pen Needle (B-D UF III MINI PEN NEEDLES) 31G X 5 MM MISC Inject 120 Devices into the skin 4 (four) times daily. Use a new needle for injecting insulin4 times a day 02/25/15   Janith Lima, MD    Allergies  Allergen Reactions  . Lisinopril Cough  . Toujeo Solostar [Insulin Glargine] Other (See Comments)    Shaking, felt bad all over  . Clindamycin Diarrhea  . Other Other (See Comments)    toujeo with perioral tingling only - pt refuses to take further  . Oxycodone-Acetaminophen Diarrhea  . Penicillins Hives    Has patient had a PCN reaction causing immediate rash, facial/tongue/throat swelling, SOB or lightheadedness with hypotension: Yes Has patient had a PCN reaction causing severe rash involving mucus membranes or skin necrosis: no  Has patient had a PCN reaction that required hospitalization No Has patient had a PCN reaction occurring within the last 10 years: No If all of the above answers are "NO", then may proceed with Cephalosporin use.    Physical Exam  Vitals  Blood pressure 103/74, pulse 59, temperature 97.5 F (36.4 C), temperature source Oral, resp. rate 22, height 6\' 2"  (1.88 m), weight 88.451 kg (195 lb), SpO2 94 %.   General: Pleasant, thin, elderly male lying in bed in NAD, wife at bedside  Psych:  Normal affect and insight, Not Suicidal or Homicidal, Awake Alert, Oriented X 3.  Neuro:   No F.N deficits, ALL C.Nerves Intact, Strength 5/5 all 4 extremities, Sensation intact all 4 extremities.  ENT:  Ears and Eyes appear Normal, Conjunctivae clear, PER. Moist oral mucosa without erythema or exudates.  Neck:  Supple, No lymphadenopathy appreciated  Respiratory:  Symmetrical chest wall movement, Good air movement  bilaterally, CTAB.  Cardiac:  RRR, No Murmurs, 1+ bilateral LE edema noted, ++JVD.    Abdomen:  Positive bowel sounds, Soft, Non tender, Non distended,  No masses appreciated  Skin:  No Cyanosis, Normal Skin Turgor, No Skin Rash or Bruise.  Extremities:  Able to move all 4. 5/5 strength in each,  no effusions.  Data Review  Wt Readings from Last 3 Encounters:  11/04/15 88.451 kg (195 lb)  11/04/15 85.73 kg (189 lb)  10/21/15 84.596 kg (186 lb 8 oz)    CBC  Recent Labs Lab 11/04/15 1100 11/04/15 1312  WBC 7.4 7.1  HGB 12.8* 11.8*  HCT 39.5 35.6*  PLT 167.0 151  MCV 96.9 95.7  MCH  --  31.7  MCHC 32.5 33.1  RDW 14.8 13.8  LYMPHSABS 1.0 1.1  MONOABS 1.0 1.1*  EOSABS 0.5 0.4  BASOSABS 0.0 0.0    Chemistries   Recent Labs Lab 11/04/15 1100 11/04/15 1312  NA 137 135  K 5.1 4.5  CL 103 104  CO2 27 26  GLUCOSE 140* 115*  BUN 20 18  CREATININE 1.16 1.16  CALCIUM 9.0 8.7*  MG  --  2.0  AST  --  28  ALT  --  18  ALKPHOS  --  123  BILITOT  --  0.8      Lab Results  Component Value Date   HGBA1C 6.5 10/21/2015    CREATININE: 1.16 (11/04/15 1312) Estimated creatinine clearance - 51.2 mL/min     Urinalysis    Component Value Date/Time   COLORURINE YELLOW 02/25/2015 1524   APPEARANCEUR CLEAR 02/25/2015 1524   LABSPEC 1.020 02/25/2015 1524   PHURINE  5.5 02/25/2015 1524   GLUCOSEU NEGATIVE 02/25/2015 1524   GLUCOSEU NEGATIVE 07/06/2013 [place] 02/25/2015 1524   [place] 02/25/2015 1524   [place] 02/25/2015 1524   PROTEINUR NEGATIVE 07/06/2013 1252   UROBILINOGEN 0.2 02/25/2015 1524   NITRITE NEGATIVE 02/25/2015 1524   LEUKOCYTESUR NEGATIVE 02/25/2015 1524    Imaging results:   Dg Chest 2 View  11/04/2015  CLINICAL DATA:  Shortness of breath, nonproductive cough for the past week; history of coronary artery disease diabetes and bile duct malignancy; former smoker. EXAM: CHEST  2 VIEW COMPARISON:  PA and  lateral chest x-ray of June 22, 2013 FINDINGS: There is a new moderate-sized right pleural effusion occupying 1/2 of the right hemithorax. Right basilar atelectasis or pneumonia may be obscured. There is small amount of atelectasis or infiltrate at the left lung base medially. A trace of pleural fluid on the left is suspected. The cardiac silhouette is mildly enlarged. The pulmonary vascularity is normal. The bony thorax exhibits no acute abnormality. IMPRESSION: Since the previous study the patient has developed a moderate sized right pleural effusion. This may be obscuring right basilar atelectasis, pneumonia, or mass. There is a trace left pleural effusion and minimal left basilar atelectasis or infiltrate. There is no evidence of CHF. Chest CT scanning is recommended. Electronically Signed   By: David  Martinique M.D.   On: 11/04/2015 11:30    My personal review of EKG: NSR, No ST changes noted.   Assessment & Plan  Principal Problem:   Pleural effusion Active Problems:   Type II diabetes mellitus with manifestations (HCC)   Persistent atrial fibrillation (HCC)   CAD (coronary artery disease)   Hypothyroid   Right-sided pleural effusion Uncertain etiology: Possibly heart failure versus, pneumonia (less likely), malignancy. No previous history of pleural effusion. Will give IV Lasix in an attempt to gently/carefully diurese. Unfortunately his blood pressure is already somewhat soft. Follow chest x-ray in the a.m.   Addendum:  Patient received quick eval from IR in the ER and thoracentesis was performed.  They removed 1.4 liters fluid.  The fluid was sent for gram stain, cytology, cell count, culture, protein, albumin, and LDH.  Possible diastolic versus systolic heart failure? Patient appears in mild volume overload. + JVD, +1 bilateral lower extremity edema. Update 2-D echocardiogram. Continue Coreg. No ACE inhibitor as blood pressure soft. Patient received 1 dose of IV lasix in the ER.   Will change lasix from 20 mg PO QOD to daily.  Persistent atrial fibrillation Currently not well controlled with rates between 100-125.  Continue Eliquis, Coreg, add diltiazem PRN.  Type 2 diabetes Continue Levemir and NovoLog.  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 Continue Creon supplementation.  Hypothyroidism Not currently on thyroid hormone supplementation? Check TSH.     Consultants Called:    None.  Family Communication:     Wife at bedside  Code Status:   Full code, but does not want long-term life support   Condition:    Guarded  Potential Disposition:   To be determined. Hospital stay will likely be 4+ days in length.  Time spent in minutes : [place],  [place] on 11/04/2015 at 3:03 PM Between 7am to 7pm - Pager - [phone] After 7pm go to [email or web address] - password [email or web address] And look for the night coverage person covering me after hours

## 2015-11-04 NOTE — ED Notes (Signed)
Per [facility] pt is from [facility]. He stated that about 2 weeks ago he began to have a really bad cough.  He went into [facility] today to get help for the cough.  It was noted that he has fluid in both lungs, however, mostly in the left lung.  He was also noted to be in AFib.  VS are as follows: BP: 112/70 HR: 80-100.  CBG: 118 SPO2: 95% on RA

## 2015-11-04 NOTE — Procedures (Signed)
Successful US guided right thoracentesis. Yielded 1.4 liters of yellow fluid. Pt tolerated procedure well. No immediate complications.  Specimen was sent for labs. CXR ordered.  Tsosie Billing D PA-C 11/04/2015 4:18 PM

## 2015-11-04 NOTE — ED Provider Notes (Signed)
CSN: [id number]     Arrival date & time 11/04/15  1240 History   First MD Initiated Contact with Patient 11/04/15 1247     Chief Complaint  Patient presents with  . Shortness of Breath  . Atrial Fibrillation     (Consider location/radiation/quality/duration/timing/severity/associated sxs/prior Treatment) HPI 81 year old male who presents with shortness of breath. History of persistent atrial fibrillation on Eliquis, CAD, DM-II, HTN, and HLD. Has had 2 week history of progressive shortness of breath, initially with exertion, and now at rest. States that after discontinuation of his rhythm control for Afib a month ago, he initially noticed mild SOB but not significant to him until about 2 weeks ago. Has not had increased lower extremity edema, increased abdominal girth or weight gain. Has more recently noted orthopnea, having difficulty sleeping when lying backwards. Denies any PND. Has had mild nonproductive cough, but no sore throat, congestion, fevers or chills. Due to progressive symptoms, he was seen by his primary care doctor today, and had a chest x-ray that was concerning for new onset pleural effusion. He was sent to the ED for further evaluation. No weight loss, night sweats.  Past Medical History  Diagnosis Date  . Acid reflux disease     history of  . H/O: GI bleed   . Paroxysmal atrial fibrillation (HCC)     chronic anticoag; tikosyn  . Benign prostatic hypertrophy     history of  . Hepatic damage march 2009    retained hepatic stone , intrahepatic duct catheter  . Diabetes mellitus     insulin dep  . CAD (coronary artery disease)   . Hypertension   . 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 Seizures ([place])   . Ventricular tachycardia ? Tikosyn proarrhtyhmia   . Cataracts, bilateral    Past Surgical History  Procedure Laterality  Date  . Cardioversion    . Cholecystectomy      history of  . Partial gastrectomy      history of  . Shoulder surgery      left shoulder repair with 2 pens inserted  . Whipple procedure      operation  . Sarcoma removal from rigth tricep    . Inguinal hernia repair      inguinal herniorrhaphy, right... history of  . Appendectomy      history of  . Cardioversion  03/07/2012    Procedure: CARDIOVERSION;  Surgeon: Deboraha Sprang, MD;  Location: [facility];  Service: Cardiovascular;  Laterality: N/A;  . Cardioversion N/A 08/10/2015    Procedure: CARDIOVERSION;  Surgeon: Thayer Headings, MD;  Location: [facility] ENDOSCOPY;  Service: Cardiovascular;  Laterality: N/A;   Family History  Problem Relation Age of Onset  . Coronary artery disease Father 110  . Heart disease Father     fatal MI  . Osteoarthritis Mother 17  . Arthritis Mother   . Coronary artery disease Brother     cabg, avr, pvd with sents  . Heart disease Brother     CABG, PVD  . Peripheral vascular disease Sister 42  . Fibromyalgia Sister   . Arthritis Sister   . Coronary artery disease Sister   . Diabetes Sister   . Cancer Brother    Social History  Substance Use Topics  . Smoking status: Former Smoker  Types: Pipe    Quit date: 03/14/1991  . Smokeless tobacco: Never Used  . Alcohol Use: No    Review of Systems 10/14 systems reviewed and are negative other than those stated in the HPI   Allergies  Lisinopril; [place]jeo solostar; Clindamycin; Other; Oxycodone-acetaminophen; and Penicillins  Home Medications   Prior to Admission medications   Medication Sig Start Date End Date Taking? Authorizing Provider  Ascorbic Acid (VITAMIN C) 500 MG tablet Take 500 mg by mouth daily.    Yes Historical Provider, MD  carvedilol (COREG) 6.25 MG tablet Take 1 tablet (6.25 mg total) by mouth 2 (two) times daily. 09/02/15  Yes Deboraha Sprang, MD  [facility] GLUCOSAMINE-CHONDROITIN PO Take 1 tablet by mouth daily. 1200/600 mg   Yes Historical  Provider, MD  ELIQUIS 2.5 MG TABS tablet Take 1 tablet by mouth two  times daily 02/20/15  Yes Deboraha Sprang, MD  Emollient (AMLACTIN XL) LOTN Apply 1 application topically daily. Apply lotion to legs   Yes Historical Provider, MD  finasteride (PROSCAR) 5 MG tablet Take 1 tablet (5 mg total) by mouth daily. 06/10/15  Yes Janith Lima, MD  furosemide (LASIX) 20 MG tablet Take 1 tablet (20 mg total) by mouth every other day. 10/21/15  Yes Janith Lima, MD  insulin aspart (NOVOLOG FLEXPEN) 100 UNIT/ML FlexPen Inject 4-8 Units into the skin 3 (three) times daily with meals. Inject 5 units subcutaneously with breakfast, 3 units with lunch and 5 units with supper   Yes Historical Provider, MD  Insulin Detemir (LEVEMIR FLEXTOUCH) 100 UNIT/ML Pen Inject 5 Units into the skin at bedtime.    Yes Historical Provider, MD  lipase/protease/amylase (CREON) 12000 UNITS CPEP capsule Take 1 capsule (12,000 Units total) by mouth 3 (three) times daily before meals. 10/27/14  Yes Janith Lima, MD  Multiple Vitamins-Minerals (CENTRUM SILVER PO) Take 1 tablet by mouth daily.    Yes Historical Provider, MD  Probiotic Product (PROBIOTIC DAILY PO) Take 3 tablets by mouth daily. 3 tabs once a day with meals.   Yes Historical Provider, MD  SALINE NA Place 1 spray into both nostrils 2 (two) times daily.    Yes Historical Provider, MD  tamsulosin (FLOMAX) 0.4 MG CAPS capsule Take 1 capsule (0.4 mg total) by mouth 2 (two) times daily. 06/10/15  Yes Janith Lima, MD  azelastine (ASTELIN) 0.1 % nasal spray Place 2 sprays into both nostrils 2 (two) times daily. Use in each nostril as directed 10/21/15   Janith Lima, MD  Insulin Pen Needle (B-D UF III MINI PEN NEEDLES) 31G X 5 MM MISC Inject 120 Devices into the skin 4 (four) times daily. Use a new needle for injecting insulin4 times a day 02/25/15   Janith Lima, MD   BP 103/74 mmHg  Pulse 59  Temp(Src) 97.5 F (36.4 C) (Oral)  Resp 22  Ht 6\' 2"  (1.88 m)  Wt 195 lb  (88.451 kg)  BMI 25.03 kg/m2  SpO2 94% Physical Exam Physical Exam  Nursing note and vitals reviewed. Constitutional: Well developed, well nourished, non-toxic, and in no acute distress Head: Normocephalic and atraumatic.  Mouth/Throat: Oropharynx is clear and moist.  Neck: Normal range of motion. Neck supple.  Cardiovascular: Tachycardic rate, irregularly irregular rhythm. No lower extremity edema. Pulmonary/Chest: Effort normal no conversational dyspnea. Diminished breath sounds over the right side of the lung up to above the mid lung.  Abdominal: Soft. There is no tenderness. There is no rebound and  no guarding.  Musculoskeletal: Normal range of motion.  Neurological: Alert, fluent speech, moves all extremities symmetrically Skin: Skin is warm and dry.  Psychiatric: Cooperative  ED Course  Procedures (including critical care time) Labs Review Labs Reviewed  CBC WITH DIFFERENTIAL/PLATELET - Abnormal; Notable for the following:    RBC 3.72 (*)    Hemoglobin 11.8 (*)    HCT 35.6 (*)    Monocytes Absolute 1.1 (*)    All other components within normal limits  COMPREHENSIVE METABOLIC PANEL - Abnormal; Notable for the following:    Glucose, Bld 115 (*)    Calcium 8.7 (*)    Total Protein 6.0 (*)    Albumin 3.0 (*)    GFR calc non Af Amer 54 (*)    All other components within normal limits  BRAIN NATRIURETIC PEPTIDE - Abnormal; Notable for the following:    B Natriuretic Peptide 469.1 (*)    All other components within normal limits  MAGNESIUM  I-STAT TROPOININ, ED    Imaging Review Dg Chest 2 View  11/04/2015  CLINICAL DATA:  Shortness of breath, nonproductive cough for the past week; history of coronary artery disease diabetes and bile duct malignancy; former smoker. EXAM: CHEST  2 VIEW COMPARISON:  PA and lateral chest x-ray of June 22, 2013 FINDINGS: There is a new moderate-sized right pleural effusion occupying 1/2 of the right hemithorax. Right basilar atelectasis or  pneumonia may be obscured. There is small amount of atelectasis or infiltrate at the left lung base medially. A trace of pleural fluid on the left is suspected. The cardiac silhouette is mildly enlarged. The pulmonary vascularity is normal. The bony thorax exhibits no acute abnormality. IMPRESSION: Since the previous study the patient has developed a moderate sized right pleural effusion. This may be obscuring right basilar atelectasis, pneumonia, or mass. There is a trace left pleural effusion and minimal left basilar atelectasis or infiltrate. There is no evidence of CHF. Chest CT scanning is recommended. Electronically Signed   By: David  Martinique M.D.   On: 11/04/2015 11:30   I have personally reviewed and evaluated these images and lab results as part of my medical decision-making.   EKG Interpretation   Date/Time:  Wednesday November 04 2015 12:30:09 EST Ventricular Rate:  105 PR Interval:    QRS Duration: 84 QT Interval:  362 QTC Calculation: 478 R Axis:   -41 Text Interpretation:  Atrial fibrillation with rapid ventricular response  Left axis deviation Low voltage QRS Septal infarct , age undetermined  Abnormal ECG No significant change since last tracing aside from increased  HR Confirmed by Durinda Buzzelli MD, Hinton Dyer [id number]) on 11/04/2015 12:49:54 PM      MDM   Final diagnoses:  Pleural effusion  Persistent atrial fibrillation ([place])    81 year old male with history of CAD, atrial fibrillation, hypertension, diabetes and hyperlipidemia who presents with 2 weeks of progressive worsening shortness of breath and orthopnea. No prior history of heart failure. On exam, has diminished breath sounds over the right side of his lung up to the mid level. Chest x-ray from his primary care doctor's office is reviewed and shows evidence of a large right-sided pleural effusion. He is on room air, breathing comfortably, and without conversational dyspnea. Otherwise, has no evidence of acute fluid overload.  Unclear if effusion is secondary to possible underlying heart failure from persistent atrial fibrillation. Does not seem infectious in nature as he does not have significant infectious symptoms. No other signs or symptoms suggesting underlying malignancy.  Will likely require thoracentesis and additional workup. Discussed with [facility] hospitalist service who will admit for ongoing management.   Forde Dandy, MD 11/04/15 [phone]

## 2015-11-04 NOTE — Progress Notes (Signed)
Subjective:  Patient ID: [name], male    DOB: 07/10/1925  Age: 81 y.o. MRN: [id number]  CC: Cough   HPI [name] presents for follow-up on cough. Since I last saw him he has stopped taking the ACE inhibitor but his cough has worsened. He complains of a cough that is rarely productive of frothy phlegm. He complains of worsening shortness of breath, orthopnea, fatigue, weakness.  Outpatient Prescriptions Prior to Visit  Medication Sig Dispense Refill  . Ascorbic Acid (VITAMIN C) 500 MG tablet Take 500 mg by mouth daily.     Marland Kitchen azelastine (ASTELIN) 0.1 % nasal spray Place 2 sprays into both nostrils 2 (two) times daily. Use in each nostril as directed 90 mL 3  . carvedilol (COREG) 6.25 MG tablet Take 1 tablet (6.25 mg total) by mouth 2 (two) times daily. (Patient taking differently: Take 3.125 mg by mouth 2 (two) times daily. ) 180 tablet 3  . [facility] GLUCOSAMINE-CHONDROITIN PO Take 1 tablet by mouth daily. 1200/600 mg    . ELIQUIS 2.5 MG TABS tablet Take 1 tablet by mouth two  times daily 180 tablet 1  . Emollient (AMLACTIN XL) LOTN Apply 1 application topically daily. Apply lotion to legs    . finasteride (PROSCAR) 5 MG tablet Take 1 tablet (5 mg total) by mouth daily. 90 tablet 3  . furosemide (LASIX) 20 MG tablet Take 1 tablet (20 mg total) by mouth every other day. 45 tablet 3  . insulin aspart (NOVOLOG FLEXPEN) 100 UNIT/ML FlexPen Inject 4-8 Units into the skin 3 (three) times daily with meals. Inject 6 units subcutaneously with breakfast, 4 units with lunch and 8 units with supper    . Insulin Detemir (LEVEMIR FLEXTOUCH) 100 UNIT/ML Pen Inject 6 Units into the skin at bedtime.    . Insulin Pen Needle (B-D UF III MINI PEN NEEDLES) 31G X 5 MM MISC Inject 120 Devices into the skin 4 (four) times daily. Use a new needle for injecting insulin4 times a day 360 each 3  . lipase/protease/amylase (CREON) 12000 UNITS CPEP capsule Take 1 capsule (12,000 Units total) by mouth 3 (three) times  daily before meals. 270 capsule 3  . Multiple Vitamins-Minerals (CENTRUM SILVER PO) Take 1 tablet by mouth daily.     . Probiotic Product (PROBIOTIC DAILY PO) Take 3 tablets by mouth daily. 3 tabs once a day with meals.    Marland Kitchen SALINE NA Place 1 spray into both nostrils daily.    . tamsulosin (FLOMAX) 0.4 MG CAPS capsule Take 1 capsule (0.4 mg total) by mouth 2 (two) times daily. 180 capsule 3   No facility-administered medications prior to visit.    ROS Review of Systems  Constitutional: Positive for fatigue. Negative for fever, chills and diaphoresis.  HENT: Negative.   Eyes: Negative.   Respiratory: Positive for cough and shortness of breath. Negative for apnea, choking, chest tightness, wheezing and stridor.   Cardiovascular: Negative.  Negative for chest pain, palpitations and leg swelling.  Gastrointestinal: Negative.  Negative for abdominal pain.  Endocrine: Negative.   Genitourinary: Negative.   Musculoskeletal: Negative.   Skin: Negative.   Allergic/Immunologic: Negative.   Neurological: Positive for weakness and light-headedness. Negative for dizziness, syncope, speech difficulty and numbness.  Hematological: Negative.  Negative for adenopathy. Does not bruise/bleed easily.  Psychiatric/Behavioral: Negative.     Objective:  BP 110/68 mmHg  Pulse 93  Temp(Src) 97.6 F (36.4 C) (Oral)  Resp 16  Ht 6\' 2"  (1.88  m)  Wt 189 lb (85.73 kg)  BMI 24.26 kg/m2  SpO2 92%  BP Readings from Last 3 Encounters:  11/04/15 110/68  10/21/15 98/58  09/30/15 96/82    Wt Readings from Last 3 Encounters:  11/04/15 189 lb (85.73 kg)  10/21/15 186 lb 8 oz (84.596 kg)  09/30/15 183 lb 9.6 oz (83.28 kg)    Physical Exam  Constitutional:  Non-toxic appearance. He has a sickly appearance. He appears ill. He appears distressed.  HENT:  Mouth/Throat: Oropharynx is clear and moist. No oropharyngeal exudate.  Eyes: Conjunctivae are normal. Right eye exhibits no discharge. Left eye exhibits  no discharge. No scleral icterus.  Neck: Normal range of motion. Neck supple. No JVD present. No tracheal deviation present. No thyromegaly present.  Cardiovascular: An irregularly irregular rhythm present. Tachycardia present.  Exam reveals gallop and S3.   Murmur heard. EKG - Rate =94. A  Fib but no new Q waves or ST/T wave changes, no changes compared to EKG done about 1 month ago  Pulmonary/Chest: No accessory muscle usage or stridor. No tachypnea. No respiratory distress. He has decreased breath sounds in the right upper field, the right middle field and the left lower field. He has no wheezes. He has no rhonchi. He has no rales.  Lymphadenopathy:    He has no cervical adenopathy.  Vitals reviewed.   Lab Results  Component Value Date   WBC 5.5 08/06/2015   HGB 11.9* 08/06/2015   HCT 36.1* 08/06/2015   PLT 147* 08/06/2015   GLUCOSE 124* 10/21/2015   CHOL 114 10/21/2015   TRIG 59.0 10/21/2015   HDL 46.20 10/21/2015   LDLCALC 56 10/21/2015   ALT 22 04/06/2015   AST 27 04/06/2015   NA 137 10/21/2015   K 4.5 10/21/2015   CL 105 10/21/2015   CREATININE 1.06 10/21/2015   BUN 19 10/21/2015   CO2 25 10/21/2015   TSH 7.48* 10/21/2015   PSA 2.28 01/04/2010   INR 1.41 07/07/2013   HGBA1C 6.5 10/21/2015   MICROALBUR <0.7 02/25/2015   Dg Chest 2 View  11/04/2015  CLINICAL DATA:  Shortness of breath, nonproductive cough for the past week; history of coronary artery disease diabetes and bile duct malignancy; former smoker. EXAM: CHEST  2 VIEW COMPARISON:  PA and lateral chest x-ray of June 22, 2013 FINDINGS: There is a new moderate-sized right pleural effusion occupying 1/2 of the right hemithorax. Right basilar atelectasis or pneumonia may be obscured. There is small amount of atelectasis or infiltrate at the left lung base medially. A trace of pleural fluid on the left is suspected. The cardiac silhouette is mildly enlarged. The pulmonary vascularity is normal. The bony thorax exhibits no  acute abnormality. IMPRESSION: Since the previous study the patient has developed a moderate sized right pleural effusion. This may be obscuring right basilar atelectasis, pneumonia, or mass. There is a trace left pleural effusion and minimal left basilar atelectasis or infiltrate. There is no evidence of CHF. Chest CT scanning is recommended. Electronically Signed   By: David  Martinique M.D.   On: 11/04/2015 11:30    No results found.  Assessment & Plan:   Beto was seen today for cough.  Diagnoses and all orders for this visit:  Coronary artery disease involving native coronary artery of native heart without angina pectoris- his EKG does not reveal any acute changes. He doesn't have any chest pain but has other symptoms that could be related to angina. Will check a set of cardiac  enzymes. -     Troponin I; Future -     Basic metabolic panel; Future -     D-dimer, quantitative (not at [facility]); Future -     Cardiac panel; Future -     Brain natriuretic peptide; Future  DOE (dyspnea on exertion)- his chest x-ray shows a large pleural effusion on the right side obliterating half of the right hemithorax. There is also a small effusion on the left side. He will need an emergent evaluation of this and possibly an emergent thoracentesis so I have referred him to the emergency room. His wife is with him but they have no transportation so [facility] will be [facility] via EMS. -     EKG 12-Lead -     Troponin I; Future -     D-dimer, quantitative (not at [facility]); Future -     Cardiac panel; Future -     Brain natriuretic peptide; Future  Cough- chest x-ray reveals a large pleural effusion. See above for management. -     DG Chest 2 View; Future  Anemia of chronic disease- will recheck his hemoglobin and hematocrit to see if the anemia is contributing to his symptoms today. -     CBC with Differential/Platelet; Future   I am having [name] maintain his AMLACTIN XL, Multiple  Vitamins-Minerals (CENTRUM SILVER PO), vitamin C, [facility]S GLUCOSAMINE-CHONDROITIN PO, Probiotic Product (PROBIOTIC DAILY PO), SALINE NA, lipase/protease/amylase, ELIQUIS, Insulin Pen Needle, tamsulosin, finasteride, Insulin Detemir, insulin aspart, carvedilol, furosemide, and azelastine.  No orders of the defined types were placed in this encounter.     Follow-up: Return in about 1 week (around 11/11/2015).  [name], MD

## 2015-11-04 NOTE — Progress Notes (Signed)
Pre visit review using our clinic review tool, if applicable. No additional management support is needed unless otherwise documented below in the visit note. 

## 2015-11-04 NOTE — ED Notes (Signed)
Dinner tray ordered for patient.

## 2015-11-05 ENCOUNTER — Encounter (HOSPITAL_COMMUNITY): Payer: Medicare Other

## 2015-11-05 ENCOUNTER — Inpatient Hospital Stay (HOSPITAL_COMMUNITY): Payer: Medicare Other

## 2015-11-05 ENCOUNTER — Encounter (HOSPITAL_COMMUNITY): Payer: Self-pay | Admitting: General Practice

## 2015-11-05 DIAGNOSIS — R0600 Dyspnea, unspecified: Secondary | ICD-10-CM

## 2015-11-05 LAB — LACTATE DEHYDROGENASE: LDH: 157 U/L (ref 98–192)

## 2015-11-05 LAB — BASIC METABOLIC PANEL
ANION GAP: 8 (ref 5–15)
BUN: 19 mg/dL (ref 6–20)
CALCIUM: 8.3 mg/dL — AB (ref 8.9–10.3)
CHLORIDE: 103 mmol/L (ref 101–111)
CO2: 24 mmol/L (ref 22–32)
Creatinine, Ser: 1.14 mg/dL (ref 0.61–1.24)
GFR calc non Af Amer: 55 mL/min — ABNORMAL LOW (ref >60)
Glucose, Bld: 153 mg/dL — ABNORMAL HIGH (ref 65–99)
Potassium: 4.2 mmol/L (ref 3.5–5.1)
Sodium: 135 mmol/L (ref 135–145)

## 2015-11-05 LAB — GLUCOSE, CAPILLARY
GLUCOSE-CAPILLARY: 63 mg/dL — AB (ref 65–99)
GLUCOSE-CAPILLARY: 88 mg/dL (ref 65–99)
GLUCOSE-CAPILLARY: 143 mg/dL — AB (ref 65–99)
Glucose-Capillary: 84 mg/dL (ref 65–99)
Glucose-Capillary: 103 mg/dL — ABNORMAL HIGH (ref 65–99)

## 2015-11-05 LAB — MRSA PCR SCREENING: MRSA BY PCR: NEGATIVE

## 2015-11-05 LAB — T4, FREE: FREE T4: 1 ng/dL (ref 0.61–1.12)

## 2015-11-05 MED ORDER — IOHEXOL 350 MG/ML SOLN
80.0000 mL | Freq: Once | INTRAVENOUS | Status: AC | PRN
  Administered 2015-11-05: 80 mL via INTRAVENOUS

## 2015-11-05 NOTE — Progress Notes (Signed)
[facility] PROGRESS NOTE  [name] [id number] DOB: 06-04-1925 DOA: 11/04/2015 PCP: [name], MD  Length of stay: 1   Assessment/Plan: Principal Problem:   Pleural effusion Active Problems:   Type II diabetes mellitus with manifestations ([place])   Persistent atrial fibrillation (HCC)   CAD (coronary artery disease)   Hypothyroid   Acute on chronic heart failure (HCC)   S/P thoracentesis   SOB (shortness of breath)    Brief summary 81 y.o. male, with permanent atrial fibrillation on Eliquis, diabetes mellitus, seizures, and history of Whipple procedure who presents to the emergency department with shortness of breath. [name] saw his primary care physician this morning for cough and shortness of breath and was sent to the emergency department after an x-ray showed a large right pleural effusion. [name] reports that he has been feeling fatigued and badly for about the past month. But over the last week he has become much worse. He is no longer able to walk his daily 1/2 mile. He is unable to sleep. He has a dry cough but occasionally brings up frothy sputum. He also coughs when he eats or drinks liquids, +orthopnea, +PND, +insomnia. The patient reports he was taken off of amiodarone approximately 3 mos ago, and was told by his cardiologist, Dr. Caryl Comes, that his afib was going to be permanent.   Assessment and plan Right-sided pleural effusion Uncertain etiology: Possibly heart failure versus, pneumonia (less likely),vs  malignancy. Appears to be transudative in nature, await cytology results  Continue IV Lasix in an attempt to gently/carefully diurese. Unfortunately his blood pressure is already somewhat soft.  S/p  thoracentesis was performed. They removed 1.4 liters fluid.  Check serum LDH  CT chest to r/o PE, malignancy  R/o dvt due to elevated d-dimer    Possible diastolic versus systolic heart failure? Patient appears in mild volume overload. +  JVD, +1 bilateral lower extremity edema. Repeat  2-D echocardiogram. Continue Coreg. No ACE inhibitor as blood pressure soft. Patient received 1 dose of IV lasix in the ER. Will change lasix from 20 mg PO QOD to daily.   Persistent atrial fibrillation Currently not well controlled with rates between 100-125. Continue Eliquis, Coreg, add diltiazem PRN.  Type 2 diabetes Dc Levemir and continue NovoLog.  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 Continue Creon supplementation.  Hypothyroidism Not currently on thyroid hormone supplementation?  TSH 6.85. Check FT4     DVT prophylaxsis eliquis   Code Status:      Code Status Orders        Start     Ordered   11/04/15 1629  Full code   Continuous     11/04/15 1628    Advance Directive Documentation        Most Recent Value   Type of Advance Directive  Healthcare Power of Attorney   Pre-existing out of facility DNR order (yellow form or pink MOST form)     "MOST" Form in Place?        Family Communication: Discussed in detail with the patient, all imaging results, lab results explained to the patient   Disposition Plan: 2-3 days    Consultants:  None  Procedures:  Thoracentesis  Antibiotics: Anti-infectives    None         HPI/Subjective: Patient had thoracentesis yesterday with removal of 1.4 L of fluid, repeat chest x-ray unchanged However subjectively the patient feels a  whole lot better  Objective: Filed Vitals:   11/04/15 1850 11/04/15 2016 11/04/15 2155 11/05/15 0403  BP: 102/56 94/53 100/64 95/57  Pulse: 124 118 131 115  Temp: 97.9 F (36.6 C) 97.7 F (36.5 C)  97.9 F (36.6 C)  TempSrc: Oral Oral  Oral  Resp: 20 20  20   Height:      Weight:    79.606 kg (175 lb 8 oz)  SpO2: 98% 97% 96% 95%    Intake/Output Summary (Last 24 hours) at 11/05/15 1105 Last data filed at 11/05/15 0800  Gross per 24 hour  Intake    240 ml  Output   1000 ml  Net    -760 ml    Exam:  General: No acute respiratory distress Lungs: Clear to auscultation bilaterally without wheezes or crackles Cardiovascular: Regular rate and rhythm without murmur gallop or rub normal S1 and S2 Abdomen: Nontender, nondistended, soft, bowel sounds positive, no rebound, no ascites, no appreciable mass Extremities: No significant cyanosis, clubbing, or edema bilateral lower extremities     Data Review   Micro Results Recent Results (from the past 240 hour(s))  Culture, body fluid-bottle     Status: None (Preliminary result)   Collection Time: 11/04/15  4:18 PM  Result Value Ref Range Status   Specimen Description FLUID PLEURAL RIGHT  Final   Special Requests BOTTLES DRAWN AEROBIC AND ANAEROBIC 10CC  Final   Culture PENDING  Incomplete   Report Status PENDING  Incomplete  Gram stain     Status: None   Collection Time: 11/04/15  4:18 PM  Result Value Ref Range Status   Specimen Description FLUID PLEURAL RIGHT  Final   Special Requests NONE  Final   Gram Stain   Final    WBC PRESENT, PREDOMINANTLY MONONUCLEAR NO ORGANISMS SEEN CYTOSPIN    Report Status 11/04/2015 FINAL  Final    Radiology Reports Dg Chest 1 View  11/04/2015  CLINICAL DATA:  Initial encounter for status post right thoracentesis. EXAM: CHEST 1 VIEW COMPARISON:  11/04/2015, earlier the same day. FINDINGS: Interval decrease in right pleural effusion no evidence for pneumothorax. Stable appearance of catheter tubing over the medial right hemi thorax and heart silhouette. Left lung remains clear. The cardio pericardial silhouette is enlarged. Telemetry leads overlie the chest. IMPRESSION: No evidence for pneumothorax status post thoracentesis. Electronically Signed   By: Misty Stanley M.D.   On: 11/04/2015 16:56   Dg Chest 2 View  11/05/2015  CLINICAL DATA:  History of recent thoracentesis forl reduction of right pleural effusion, cough and congestion EXAM: CHEST  2 VIEW COMPARISON:  Portable chest  x-ray of 11/04/2015 FINDINGS: After right thoracentesis, there is little change in the small right pleural effusion remaining with right basilar atelectasis. The left lung is clear. Cardiomegaly is stable. No pneumothorax is seen. IMPRESSION: No significant change after right thoracentesis in the volume of the right pleural effusion with right basilar atelectasis. Electronically Signed   By: Ivar Drape M.D.   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 EXAM: CHEST  2 VIEW COMPARISON:  PA and lateral chest x-ray of June 22, 2013 FINDINGS: There is a new moderate-sized right pleural effusion occupying 1/2 of the right hemithorax. Right basilar atelectasis or pneumonia may be obscured. There is small amount of atelectasis or infiltrate at the  left lung base medially. A trace of pleural fluid on the left is suspected. The cardiac silhouette is mildly enlarged. The pulmonary vascularity is normal. The bony thorax exhibits no acute abnormality. IMPRESSION: Since the previous study the patient has developed a moderate sized right pleural effusion. This may be obscuring right basilar atelectasis, pneumonia, or mass. There is a trace left pleural effusion and minimal left basilar atelectasis or infiltrate. There is no evidence of CHF. Chest CT scanning is recommended. Electronically Signed   By: David  Martinique M.D.   On: 11/04/2015 11:30   US Thoracentesis Asp Pleural Space W/img Guide  11/04/2015  INDICATION: Symptomatic right sided pleural effusion EXAM: US THORACENTESIS ASP PLEURAL SPACE W/IMG GUIDE COMPARISON:  CXR 11/04/2015. MEDICATIONS: None COMPLICATIONS: None immediate TECHNIQUE: Informed written consent was obtained from the patient after a discussion of the risks, benefits and alternatives to treatment. A timeout was performed prior to the  initiation of the procedure. Initial ultrasound scanning demonstrates a right pleural effusion. The lower chest was prepped and draped in the usual sterile fashion. 1% lidocaine was used for local anesthesia. An ultrasound image was saved for documentation purposes. A 6 Fr Safe-T-Centesis catheter was introduced. The thoracentesis was performed. The catheter was removed and a dressing was applied. The patient tolerated the procedure well without immediate post procedural complication. The patient was escorted to have an upright chest radiograph. FINDINGS: A total of approximately 1.4 Liters of yellow fluid was removed. Requested samples were sent to the laboratory. IMPRESSION: Successful ultrasound-guided right sided thoracentesis yielding 1.4 liters of pleural fluid. Read By:  Tsosie Billing PA-C Electronically Signed   By: Lucrezia Europe M.D.   On: 11/04/2015 16:31     CBC  Recent Labs Lab 11/04/15 1100 11/04/15 1312  WBC 7.4 7.1  HGB 12.8* 11.8*  HCT 39.5 35.6*  PLT 167.0 151  MCV 96.9 95.7  MCH  --  31.7  MCHC 32.5 33.1  RDW 14.8 13.8  LYMPHSABS 1.0 1.1  MONOABS 1.0 1.1*  EOSABS 0.5 0.4  BASOSABS 0.0 0.0    Chemistries   Recent Labs Lab 11/04/15 1100 11/04/15 1312 11/05/15 0812  NA 137 135 135  K 5.1 4.5 4.2  CL 103 104 103  CO2 27 26 24   GLUCOSE 140* 115* 153*  BUN 20 18 19   CREATININE 1.16 1.16 1.14  CALCIUM 9.0 8.7* 8.3*  MG  --  2.0  --   AST  --  28  --   ALT  --  18  --   ALKPHOS  --  123  --   BILITOT  --  0.8  --    ------------------------------------------------------------------------------------------------------------------ estimated creatinine clearance is 50.4 mL/min (by C-G formula based on Cr of 1.14). ------------------------------------------------------------------------------------------------------------------ No results for input(s): HGBA1C in the last 72  hours. ------------------------------------------------------------------------------------------------------------------ No results for input(s): CHOL, HDL, LDLCALC, TRIG, CHOLHDL, LDLDIRECT in the last 72 hours. ------------------------------------------------------------------------------------------------------------------  Recent Labs  11/04/15 1943  TSH 6.858*   ------------------------------------------------------------------------------------------------------------------ No results for input(s): VITAMINB12, FOLATE, FERRITIN, TIBC, IRON, RETICCTPCT in the last 72 hours.  Coagulation profile No results for input(s): INR, PROTIME in the last 168 hours.   Recent Labs  11/04/15 1100  DDIMER 0.63*    Cardiac Enzymes  Recent Labs Lab 11/04/15 1300  CKMB 2.5   ------------------------------------------------------------------------------------------------------------------ Invalid input(s): POCBNP   CBG:  Recent Labs Lab 11/04/15 1721 11/04/15 2102 11/05/15 0557 11/05/15 0633  GLUCAP 127* 100* 63* 88       Studies: Dg Chest  1 View  11/04/2015  CLINICAL DATA:  Initial encounter for status post right thoracentesis. EXAM: CHEST 1 VIEW COMPARISON:  11/04/2015, earlier the same day. FINDINGS: Interval decrease in right pleural effusion no evidence for pneumothorax. Stable appearance of catheter tubing over the medial right hemi thorax and heart silhouette. Left lung remains clear. The cardio pericardial silhouette is enlarged. Telemetry leads overlie the chest. IMPRESSION: No evidence for pneumothorax status post thoracentesis. Electronically Signed   By: Misty Stanley M.D.   On: 11/04/2015 16:56   Dg Chest 2 View  11/05/2015  CLINICAL DATA:  History of recent thoracentesis forl reduction of right pleural effusion, cough and congestion EXAM: CHEST  2 VIEW COMPARISON:  Portable chest x-ray of 11/04/2015 FINDINGS: After right thoracentesis, there is little change in  the small right pleural effusion remaining with right basilar atelectasis. The left lung is clear. Cardiomegaly is stable. No pneumothorax is seen. IMPRESSION: No significant change after right thoracentesis in the volume of the right pleural effusion with right basilar atelectasis. Electronically Signed   By: Ivar Drape M.D.   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 EXAM: CHEST  2 VIEW COMPARISON:  PA and lateral chest x-ray of June 22, 2013 FINDINGS: There is a new moderate-sized right pleural effusion occupying 1/2 of the right hemithorax. Right basilar atelectasis or pneumonia may be obscured. There is small amount of atelectasis or infiltrate at the left lung base medially. A trace of pleural fluid on the left is suspected. The cardiac silhouette is mildly enlarged. The pulmonary vascularity is normal. The bony thorax exhibits no acute abnormality. IMPRESSION: Since the previous study the patient has developed a moderate sized right pleural effusion. This may be obscuring right basilar atelectasis, pneumonia, or mass. There is a trace left pleural effusion and minimal left basilar atelectasis or infiltrate. There is no evidence of CHF. Chest CT scanning is recommended. Electronically Signed   By: David  Martinique M.D.   On: 11/04/2015 11:30   US Thoracentesis Asp Pleural Space W/img Guide  11/04/2015  INDICATION: Symptomatic right sided pleural effusion EXAM: US THORACENTESIS ASP PLEURAL SPACE W/IMG GUIDE COMPARISON:  CXR 11/04/2015. MEDICATIONS: None COMPLICATIONS: None immediate TECHNIQUE: Informed written consent was obtained from the patient after a discussion of the risks, benefits and alternatives to treatment. A timeout was performed prior to the initiation of the procedure. Initial ultrasound scanning demonstrates a right pleural  effusion. The lower chest was prepped and draped in the usual sterile fashion. 1% lidocaine was used for local anesthesia. An ultrasound image was saved for documentation purposes. A 6 Fr Safe-T-Centesis catheter was introduced. The thoracentesis was performed. The catheter was removed and a dressing was applied. The patient tolerated the procedure well without immediate post procedural complication. The patient was escorted to have an upright chest radiograph. FINDINGS: A total of approximately 1.4 Liters of yellow fluid was removed. Requested samples were sent to the laboratory. IMPRESSION: Successful ultrasound-guided right sided thoracentesis yielding 1.4 liters of pleural fluid. Read By:  Tsosie Billing PA-C Electronically Signed   By: Lucrezia Europe M.D.   On: 11/04/2015 16:31      Lab Results  Component Value Date   HGBA1C 6.5 10/21/2015   HGBA1C 6.0 06/24/2015   HGBA1C 6.3 02/25/2015   Lab Results  Component Value Date   MICROALBUR <0.7  02/25/2015   LDLCALC 56 10/21/2015   CREATININE 1.14 11/05/2015       Scheduled Meds: . acidophilus  1 capsule Oral Daily  . apixaban  2.5 mg Oral BID  . azelastine  2 spray Each Nare BID  . carvedilol  6.25 mg Oral BID  . cetaphil   Topical Daily  . finasteride  5 mg Oral Daily  . furosemide  20 mg Oral Daily  . insulin aspart  0-9 Units Subcutaneous TID WC  . insulin aspart  3 Units Subcutaneous TID WC  . insulin detemir  5 Units Subcutaneous QHS  . lipase/protease/amylase  12,000 Units Oral TID AC  . multivitamin with minerals  1 tablet Oral Daily  . sodium chloride  1 spray Each Nare BID  . sodium chloride  3 mL Intravenous Q12H  . tamsulosin  0.4 mg Oral BID   Continuous Infusions:   Principal Problem:   Pleural effusion Active Problems:   Type II diabetes mellitus with manifestations (HCC)   Persistent atrial fibrillation (HCC)   CAD (coronary artery disease)   Hypothyroid   Acute on chronic heart failure (HCC)   S/P  thoracentesis   SOB (shortness of breath)    Time spent: 45 minutes   [facility] Pager [phone]. If 7PM-7AM, please contact night-coverage at [email or web address], password [facility] 11/05/2015, 11:05 AM  LOS: 1 day

## 2015-11-05 NOTE — Care Management Note (Addendum)
Case Management Note  Patient Details  Name: [name] MRN: [id number] Date of Birth: Dec 07, 1924  Subjective/Objective:    Pt admitted with plueral effusions, underwent thoracentesis yesterday 11/04/15                Action/Plan:  Pt is independent with wife from [employer].  Pt stated he had a scale and will begin using it daily, pt stated he adheres to low sodium diet.  Pt denies difficulty with ambulating or performing ADLS.  CM will continue to monitor for disposition needs   Expected Discharge Date:                  Expected Discharge Plan:  Home/Self Care (Pt is from the Independent portion of [place] with wife)  In-House Referral:     Discharge planning Services     Post Acute Care Choice:    Choice offered to:     DME Arranged:    DME Agency:     HH Arranged:    [place] Agency:     Status of Service:  In process, will continue to follow  Medicare Important Message Given:    Date Medicare IM Given:    Medicare IM give by:    Date Additional Medicare IM Given:    Additional Medicare Important Message give by:     If discussed at [place] of Stay Meetings, dates discussed:    Additional Comments:  Maryclare Labrador, RN 11/05/2015, 2:10 PM

## 2015-11-05 NOTE — Evaluation (Signed)
Clinical/Bedside Swallow Evaluation Patient Details  Name: [name] MRN: [id number] Date of Birth: [birth year]  Today's Date: 11/05/2015 Time: SLP Start Time (ACUTE ONLY): 0945 SLP Stop Time (ACUTE ONLY): 1003 SLP Time Calculation (min) (ACUTE ONLY): 18 min  Past Medical History:  Past Medical History  Diagnosis Date  . Acid reflux disease     history of  . H/O: GI bleed   . Paroxysmal atrial fibrillation (HCC)     chronic anticoag; tikosyn  . Benign prostatic hypertrophy     history of  . Hepatic damage march 2009    retained hepatic stone , intrahepatic duct catheter  . Diabetes mellitus     insulin dep  . CAD (coronary artery disease)   . Hypertension   . 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 Seizures ([place])   . Ventricular tachycardia ? Tikosyn proarrhtyhmia   . Cataracts, bilateral    Past Surgical History:  Past Surgical History  Procedure Laterality Date  . Cardioversion    . Cholecystectomy      history of  . Partial gastrectomy      history of  . Shoulder surgery      left shoulder repair with 2 pens inserted  . Whipple procedure      operation  . Sarcoma removal from rigth tricep    . Inguinal hernia repair      inguinal herniorrhaphy, right... history of  . Appendectomy      history of  . Cardioversion  03/07/2012    Procedure: CARDIOVERSION;  Surgeon: Deboraha Sprang, MD;  Location: [facility];  Service: Cardiovascular;  Laterality: N/A;  . Cardioversion N/A 08/10/2015    Procedure: CARDIOVERSION;  Surgeon: Thayer Headings, MD;  Location: [facility] ENDOSCOPY;  Service: Cardiovascular;  Laterality: N/A;   HPI:  [name] is a 81 y.o. male, with permanent atrial fibrillation on Eliquis, diabetes mellitus, seizures, and history of Whipple procedure who presents to the emergency department with shortness of breath. Mr.  Hoffman saw his primary care physician this morning for cough and shortness of breath and was sent to the emergency department after an x-ray showed a large right pleural effusion. [name] reports that he has been feeling fatigued and badly for about the past month. But over the last week he has become much worse. He is no longer able to walk his daily 1/2 mile. He is unable to sleep. He has a dry cough but occasionally brings up frothy sputum. He also coughs when he eats or drinks liquids,    Assessment / Plan / Recommendation Clinical Impression  Pt demonstrates appearance of strong timely swallow with all textures. However, as more liquids were given pt began demosntrating a delayed cough, which then became frequent with pt reporting feeling the water in his throat. Recommend objective test of swallowing given concerns for aspiration. Pt may continue current diet until MBS complete.     Aspiration Risk  Moderate aspiration risk    Diet Recommendation     Medication Administration: Whole meds with liquid    Other  Recommendations     Follow up Recommendations   (TBD)    Frequency and Duration            Prognosis        Swallow Study   General HPI:  [name] is a 81 y.o. male, with permanent atrial fibrillation on Eliquis, diabetes mellitus, seizures, and history of Whipple procedure who presents to the emergency department with shortness of breath. [name] saw his primary care physician this morning for cough and shortness of breath and was sent to the emergency department after an x-ray showed a large right pleural effusion. [name] reports that he has been feeling fatigued and badly for about the past month. But over the last week he has become much worse. He is no longer able to walk his daily 1/2 mile. He is unable to sleep. He has a dry cough but occasionally brings up frothy sputum. He also coughs when he eats or drinks liquids,  Type of Study: Bedside Swallow  Evaluation Previous Swallow Assessment: none Diet Prior to this Study: Regular;Thin liquids Temperature Spikes Noted: No Respiratory Status: Nasal cannula History of Recent Intubation: No Behavior/Cognition: Alert;Cooperative;Pleasant mood Oral Cavity Assessment: Within Functional Limits Oral Care Completed by SLP: No Oral Cavity - Dentition: Adequate natural dentition Vision: Functional for self-feeding Self-Feeding Abilities: Able to feed self Patient Positioning: Upright in chair Baseline Vocal Quality: Normal Volitional Cough: Strong Volitional Swallow: Able to elicit    Oral/Motor/Sensory Function Overall Oral Motor/Sensory Function: Within functional limits   Ice Chips Ice chips: Not tested   Thin Liquid Thin Liquid: Impaired Presentation: Cup;Straw;Self Fed Pharyngeal  Phase Impairments: Cough - Delayed    Nectar Thick Nectar Thick Liquid: Not tested   Honey Thick Honey Thick Liquid: Not tested   Puree Puree: Within functional limits   Solid Solid: Within functional limits      Herbie Baltimore, MA CCC-SLP [id number]  Lashonne Shull, Katherene Ponto 11/05/2015,10:21 AM

## 2015-11-05 NOTE — Progress Notes (Signed)
Echocardiogram 2D Echocardiogram has been performed.  [name] 11/05/2015, 2:04 PM

## 2015-11-05 NOTE — Progress Notes (Signed)
Speech Language Pathology Treatment: Dysphagia  Patient Details Name: [name] MRN: [id number] DOB: 25-Apr-1925 Today's Date: 11/05/2015 Time: [id number] SLP Time Calculation (min) (ACUTE ONLY): 14 min  Assessment / Plan / Recommendation Clinical Impression  MBS was cancelled this afternoon due to conflict with chest CT scheduling.  Will proceed with study Fri, 12/9. Discussed with pt deferring study until next date.  Observed him consuming thin liquids with mild-throat clearing and mod I cues for upright posture, pacing rate/bolus size.  Pt endorses globus with solids, occasional regurgitation of liquids.  He attributes symptoms to sinus issues.  Pt may remain on current diet (regular, thin), and will complete MBS next date.     HPI HPI: [name] is a 81 y.o. male, with permanent atrial fibrillation on Eliquis, diabetes mellitus, seizures, and history of Whipple procedure who presents to the emergency department with shortness of breath. [name] saw his primary care physician this morning for cough and shortness of breath and was sent to the emergency department after an x-ray showed a large right pleural effusion. [name] reports that he has been feeling fatigued and badly for about the past month. But over the last week he has become much worse. He is no longer able to walk his daily 1/2 mile. He is unable to sleep. He has a dry cough but occasionally brings up frothy sputum. He also coughs when he eats or drinks liquids,       SLP Plan  Continue with current plan of care;MBS (next date)     Recommendations  Diet recommendations: Regular;Thin liquid Liquids provided via: Cup;Straw Medication Administration: Whole meds with liquid Supervision: Patient able to self feed              Follow up Recommendations:  (tbd) Plan: Continue with current plan of care;MBS (next date)   Assunta Curtis 11/05/2015, 3:35 PM

## 2015-11-05 NOTE — Progress Notes (Signed)
CRITICAL VALUE ALERT  Critical value received:  cbg 63 rechecked after 15 minutes it was 88.  Date of notification:  11/05/15  Time of notification:  05:55  Critical value read back:yes  Nurse who received alert: Eldrige Pitkin RN  MD notified (1st page):    Time of first page:   MD notified (2nd page):  Time of second page:  Responding MD:  Time MD responded:

## 2015-11-06 ENCOUNTER — Inpatient Hospital Stay (HOSPITAL_COMMUNITY): Payer: Medicare Other

## 2015-11-06 ENCOUNTER — Ambulatory Visit (HOSPITAL_COMMUNITY): Payer: Medicare Other

## 2015-11-06 DIAGNOSIS — I481 Persistent atrial fibrillation: Secondary | ICD-10-CM

## 2015-11-06 DIAGNOSIS — I5023 Acute on chronic systolic (congestive) heart failure: Secondary | ICD-10-CM

## 2015-11-06 DIAGNOSIS — J189 Pneumonia, unspecified organism: Secondary | ICD-10-CM | POA: Insufficient documentation

## 2015-11-06 DIAGNOSIS — J90 Pleural effusion, not elsewhere classified: Secondary | ICD-10-CM

## 2015-11-06 DIAGNOSIS — Z9889 Other specified postprocedural states: Secondary | ICD-10-CM

## 2015-11-06 LAB — GLUCOSE, CAPILLARY
GLUCOSE-CAPILLARY: 50 mg/dL — AB (ref 65–99)
GLUCOSE-CAPILLARY: 98 mg/dL (ref 65–99)
GLUCOSE-CAPILLARY: 97 mg/dL (ref 65–99)
Glucose-Capillary: 79 mg/dL (ref 65–99)
Glucose-Capillary: 75 mg/dL (ref 65–99)

## 2015-11-06 LAB — CBC
HEMATOCRIT: 35.3 % — AB (ref 39.0–52.0)
Hemoglobin: 11.9 g/dL — ABNORMAL LOW (ref 13.0–17.0)
MCH: 32.3 pg (ref 26.0–34.0)
MCHC: 33.7 g/dL (ref 30.0–36.0)
MCV: 95.9 fL (ref 78.0–100.0)
Platelets: 144 10*3/uL — ABNORMAL LOW (ref 150–400)
RBC: 3.68 MIL/uL — ABNORMAL LOW (ref 4.22–5.81)
RDW: 13.8 % (ref 11.5–15.5)
WBC: 6.9 10*3/uL (ref 4.0–10.5)

## 2015-11-06 LAB — COMPREHENSIVE METABOLIC PANEL
ALT: 17 U/L (ref 17–63)
AST: 25 U/L (ref 15–41)
Albumin: 2.7 g/dL — ABNORMAL LOW (ref 3.5–5.0)
Alkaline Phosphatase: 115 U/L (ref 38–126)
Anion gap: 4 — ABNORMAL LOW (ref 5–15)
BILIRUBIN TOTAL: 0.6 mg/dL (ref 0.3–1.2)
BUN: 17 mg/dL (ref 6–20)
CO2: 27 mmol/L (ref 22–32)
Calcium: 8.3 mg/dL — ABNORMAL LOW (ref 8.9–10.3)
Chloride: 103 mmol/L (ref 101–111)
Creatinine, Ser: 1.15 mg/dL (ref 0.61–1.24)
GFR calc Af Amer: >60 mL/min (ref >60)
GFR, EST NON AFRICAN AMERICAN: 55 mL/min — AB (ref >60)
Glucose, Bld: 103 mg/dL — ABNORMAL HIGH (ref 65–99)
POTASSIUM: 3.7 mmol/L (ref 3.5–5.1)
Sodium: 134 mmol/L — ABNORMAL LOW (ref 135–145)
TOTAL PROTEIN: 5.6 g/dL — AB (ref 6.5–8.1)

## 2015-11-06 MED ORDER — FUROSEMIDE 10 MG/ML IJ SOLN
40.0000 mg | Freq: Two times a day (BID) | INTRAMUSCULAR | Status: DC
Start: 2015-11-06 — End: 2015-11-07
  Administered 2015-11-06 – 2015-11-07 (×2): 40 mg via INTRAVENOUS
  Filled 2015-11-06 (×2): qty 4

## 2015-11-06 MED ORDER — CARVEDILOL 3.125 MG PO TABS
3.1250 mg | ORAL_TABLET | Freq: Two times a day (BID) | ORAL | Status: DC
  Administered 2015-11-06 – 2015-11-10 (×8): 3.125 mg via ORAL
  Filled 2015-11-06 (×8): qty 1

## 2015-11-06 MED ORDER — LIDOCAINE HCL (PF) 1 % IJ SOLN
INTRAMUSCULAR | Status: AC
  Filled 2015-11-06: qty 10

## 2015-11-06 MED ORDER — FUROSEMIDE 10 MG/ML IJ SOLN
40.0000 mg | Freq: Once | INTRAMUSCULAR | Status: AC
  Administered 2015-11-06: 40 mg via INTRAVENOUS
  Filled 2015-11-06: qty 4

## 2015-11-06 MED ORDER — LEVOFLOXACIN IN D5W 500 MG/100ML IV SOLN
500.0000 mg | INTRAVENOUS | Status: DC
  Administered 2015-11-06 – 2015-11-07 (×2): 500 mg via INTRAVENOUS
  Filled 2015-11-06 (×3): qty 100

## 2015-11-06 NOTE — Consult Note (Signed)
CARDIOLOGY CONSULT NOTE   Patient ID: [name] MRN: [id number], DOB/AGE: 1925-09-18   Admit date: 11/04/2015 Date of Consult: 11/06/2015 Reason for Consult: Worsening EF  Primary Physician: [name], MD Primary Cardiologist: Dr. Caryl Comes  HPI: [name] is a 81 y.o. male with past medical history of PAF (Failed Tikosyn, on Eliquis for anticoagulation), Type 2 DM, HTN, and Hypothyroidism who presented to [facility] ED on 11/04/2015 for a cough and shortness of breath.  While in the ED, a CXR showed a moderate sized right pleural effusion. He was seen by IR and a thoaracentesis was performed with 1.4L of fluid removed. D-dimer was elevated to 0.63 and a CTA was performed on 11/05/2015 which showed no pulmonary embolus. The moderate size right pleural effusion and small left pleural effusion were still present. Consolidation with air bronchogram in right lower lobe and right middle lobe highly suspicious for infiltrate/pneumonia was present. He has seen been started on Levaquin for CAP.  On 11/05/2015 a repeat echocardiogram was performed and showed an EF of 25-30% with diffuse hypokinesis.His previous EF was 50% in 02/2012 and showed hypokinesis of the basal inferior segment, basal inferolateral segment, and mid inferolateral segment.  History of CAD is included in his medical history yet he reports having never had a catheterization. His recent CTA this admission does show atherosclerotic calcifications of thoracic aorta and coronary arteries.  His most recent episode of atrial fibrillation was in 07/2015 and he underwent cardioversion on 08/10/2015.  Unfortunately, when he was seen in the office on 09/02/2015, he was back in atrial fibrillation and asymptomatic at that time. He also had a history of DCCV x2 in the past. It was decided to leave him in atrial fibrillation since he was asymptomatic and increase his Carvedilol for better rate control and stop Amiodarone. He would remain on  Eliquis for anticoagulation.  The patient reports he has experienced shortness of breath with activity while being at home the past few months. He reports his acute shortness of breath present at time of admission has since resolved but his dyspnea with exertion has been more chronic. Telemetry shows he has remained in atrial fibrillation with rates in the mid-90's - 160's. He reports having a pulse in the 90's - 110's at home. His BP has been soft at 90/56 - 94/67 in the past 24 hours.  Problem List Past Medical History  Diagnosis Date  . Acid reflux disease     history of  . H/O: GI bleed   . Paroxysmal atrial fibrillation (HCC)     chronic anticoag; tikosyn  . Benign prostatic hypertrophy     history of  . Hepatic damage march 2009    retained hepatic stone , intrahepatic duct catheter  . CAD (coronary artery disease)   . Hypertension   . 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 Seizures ([place])   . Ventricular tachycardia ? Tikosyn proarrhtyhmia   . Cataracts, bilateral   . Diabetes mellitus     insulin dep  . Pleural effusion 10/2015  . Shortness of breath dyspnea     Past Surgical History  Procedure Laterality Date  . Cardioversion    . Cholecystectomy      history of  . Partial gastrectomy      history of  . Shoulder  surgery      left shoulder repair with 2 pens inserted  . Whipple procedure      operation  . Sarcoma removal from rigth tricep    . Inguinal hernia repair      inguinal herniorrhaphy, right... history of  . Appendectomy      history of  . Cardioversion  03/07/2012    Procedure: CARDIOVERSION;  Surgeon: Deboraha Sprang, MD;  Location: [facility];  Service: Cardiovascular;  Laterality: N/A;  . Cardioversion N/A 08/10/2015    Procedure: CARDIOVERSION;  Surgeon: Thayer Headings, MD;  Location: [facility] ENDOSCOPY;  Service:  Cardiovascular;  Laterality: N/A;     Allergies Allergies  Allergen Reactions  . Lisinopril Cough  . Toujeo Solostar [Insulin Glargine] Other (See Comments)    Shaking, felt bad all over  . Clindamycin Diarrhea  . Other Other (See Comments)    toujeo with perioral tingling only - pt refuses to take further  . Oxycodone-Acetaminophen Diarrhea  . Penicillins Hives    Has patient had a PCN reaction causing immediate rash, facial/tongue/throat swelling, SOB or lightheadedness with hypotension: Yes Has patient had a PCN reaction causing severe rash involving mucus membranes or skin necrosis: no  Has patient had a PCN reaction that required hospitalization No Has patient had a PCN reaction occurring within the last 10 years: No If all of the above answers are "NO", then may proceed with Cephalosporin use.      Inpatient Medications . acidophilus  1 capsule Oral Daily  . apixaban  2.5 mg Oral BID  . azelastine  2 spray Each Nare BID  . carvedilol  6.25 mg Oral BID  . cetaphil   Topical Daily  . finasteride  5 mg Oral Daily  . furosemide  40 mg Intravenous Once  . insulin aspart  0-9 Units Subcutaneous TID WC  . insulin aspart  3 Units Subcutaneous TID WC  . levofloxacin (LEVAQUIN) IV  500 mg Intravenous Q24H  . lipase/protease/amylase  12,000 Units Oral TID AC  . multivitamin with minerals  1 tablet Oral Daily  . sodium chloride  1 spray Each Nare BID  . sodium chloride  3 mL Intravenous Q12H  . tamsulosin  0.4 mg Oral BID    Family History Family History  Problem Relation Age of Onset  . Coronary artery disease Father 30  . Heart disease Father     fatal MI  . Osteoarthritis Mother 14  . Arthritis Mother   . Coronary artery disease Brother     cabg, avr, pvd with sents  . Heart disease Brother     CABG, PVD  . Peripheral vascular disease Sister 37  . Fibromyalgia Sister   . Arthritis Sister   . Coronary artery disease Sister   . Diabetes Sister   . Cancer Brother       Social History Social History   Social History  . Marital Status: Married    Spouse Name: N/A  . Number of Children: 3  . Years of Education: N/A   Occupational History  . 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     Social History Main Topics  . Smoking status: Former Smoker    Types: Pipe    Quit date: 03/14/1991  . Smokeless tobacco: Never Used  . Alcohol Use: No  . Drug Use: No  . Sexual Activity: Not Currently   Other Topics Concern  . Not on file   Social History Narrative  [employer] executive. married 1954. 2 sons - [place]; 1 daughter 39. 7 grandchildren. retired- Training and development officer.  End of Life Care: DNR, no prolonged intubation (more than 2-3 days), no sustaining tube feeding, no futile or heroic measures. (Provided signed Out of Facility order and unsigned MOST form 6/112/12)     Review of Systems General:  No chills, fever, night sweats or weight changes.  Cardiovascular:  No chest pain, edema, orthopnea, palpitations, paroxysmal nocturnal dyspnea. Positive for dyspnea on exertion. Dermatological: No rash, lesions/masses Respiratory: No cough, Positive for dyspnea Urologic: No hematuria, dysuria Abdominal:   No nausea, vomiting, diarrhea, bright red blood per rectum, melena, or hematemesis Neurologic:  No visual changes, wkns, changes in mental status. All other systems reviewed and are otherwise negative except as noted above.  Physical Exam Blood pressure 90/57, pulse 73, temperature 97.6 F (36.4 C), temperature source Oral, resp. rate 18, height 6\' 2"  (1.88 m), weight 175 lb 14.4 oz (79.788 kg), SpO2 93 %.  General: Pleasant, elderly Caucasian male in NAD Psych: Normal affect. Neuro: Alert and oriented X 3. Moves all extremities spontaneously. HEENT: Normal  Neck: Supple without bruits. JVD elevated. Lungs:  Resp regular and unlabored, Decreased breath sounds on right.  Heart: Irregularly irregular, tachycardiac, no s3, s4, or  murmurs. Abdomen: Soft, non-tender, non-distended, BS + x 4.  Extremities: No clubbing, cyanosis or edema. DP/PT/Radials 2+ and equal bilaterally.  Labs  Recent Labs  11/04/15 1300  CKTOTAL 42  CKMB 2.5   Lab Results  Component Value Date   WBC 6.9 11/06/2015   HGB 11.9* 11/06/2015   HCT 35.3* 11/06/2015   MCV 95.9 11/06/2015   PLT 144* 11/06/2015    Recent Labs Lab 11/06/15 0233  NA 134*  K 3.7  CL 103  CO2 27  BUN 17  CREATININE 1.15  CALCIUM 8.3*  PROT 5.6*  BILITOT 0.6  ALKPHOS 115  ALT 17  AST 25  GLUCOSE 103*   Lab Results  Component Value Date   CHOL 114 10/21/2015   HDL 46.20 10/21/2015   LDLCALC 56 10/21/2015   TRIG 59.0 10/21/2015   Lab Results  Component Value Date   DDIMER 0.63* 11/04/2015    Radiology/Studies Dg Chest 1 View: 11/04/2015  CLINICAL DATA:  Initial encounter for status post right thoracentesis. EXAM: CHEST 1 VIEW COMPARISON:  11/04/2015, earlier the same day. FINDINGS: Interval decrease in right pleural effusion no evidence for pneumothorax. Stable appearance of catheter tubing over the medial right hemi thorax and heart silhouette. Left lung remains clear. The cardio pericardial silhouette is enlarged. Telemetry leads overlie the chest. IMPRESSION: No evidence for pneumothorax status post thoracentesis. Electronically Signed   By: Misty Stanley M.D.   On: 11/04/2015 16:56   Dg Chest 2 View: 11/05/2015  CLINICAL DATA:  History of recent thoracentesis forl reduction of right pleural effusion, cough and congestion EXAM: CHEST  2 VIEW COMPARISON:  Portable chest x-ray of 11/04/2015 FINDINGS: After right thoracentesis, there is little change in the small right pleural effusion remaining with right basilar atelectasis. The left lung is clear. Cardiomegaly is stable. No pneumothorax is seen. IMPRESSION: No significant change after right thoracentesis in the volume of the right pleural effusion with right basilar atelectasis. Electronically Signed    By: Ivar Drape M.D.   On: 11/05/2015 08:00   Dg Chest 2 View: 11/04/2015  CLINICAL DATA:  Shortness of breath, nonproductive cough for the past week; history of coronary artery disease diabetes and  bile duct malignancy; former smoker. EXAM: CHEST  2 VIEW COMPARISON:  PA and lateral chest x-ray of June 22, 2013 FINDINGS: There is a new moderate-sized right pleural effusion occupying 1/2 of the right hemithorax. Right basilar atelectasis or pneumonia may be obscured. There is small amount of atelectasis or infiltrate at the left lung base medially. A trace of pleural fluid on the left is suspected. The cardiac silhouette is mildly enlarged. The pulmonary vascularity is normal. The bony thorax exhibits no acute abnormality. IMPRESSION: Since the previous study the patient has developed a moderate sized right pleural effusion. This may be obscuring right basilar atelectasis, pneumonia, or mass. There is a trace left pleural effusion and minimal left basilar atelectasis or infiltrate. There is no evidence of CHF. Chest CT scanning is recommended. Electronically Signed   By: David  Martinique M.D.   On: 11/04/2015 11:30   Ct Angio Chest Pe W/cm &/or Wo Cm: 11/05/2015  CLINICAL DATA:  Shortness of breath, pleural effusion. 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 Multiplanar CT image reconstructions and MIPs were obtained to evaluate the vascular anatomy. CONTRAST:  39mL OMNIPAQUE IOHEXOL 350 MG/ML SOLN COMPARISON:  CT abdomen 12/16/ 15 images of the lung bases FINDINGS: Sagittal images of the spine shows osteopenia and degenerative changes thoracic spine. Sagittal view of the sternum is unremarkable. Atherosclerotic calcifications of thoracic aorta and coronary arteries. The study is of excellent technical quality. No pulmonary  embolus is noted. Bilateral perihilar peribronchial thickening is noted. There is no mediastinal hematoma or adenopathy. Images of the upper abdomen shows partially visualized postsurgical changes post Whipple procedure. There is moderate size right pleural effusion. Small left pleural effusion. There is atelectasis in left lower lobe posteriorly. There is patchy airspace disease/infiltrate in right middle lobe and right lower lobe. Findings are highly suspicious for pneumonia. There is a catheter fragment in main pulmonary artery extending from just above the origin in right main pulmonary artery and a small branch. The catheter measures at least 12.5 cm in length. This was only partially visualized on prior exam stable in position from prior exam. Review of the MIP images confirms the above findings. IMPRESSION: 1. No pulmonary embolus. 2. There is moderate size right pleural effusion. Small left pleural effusion. Atelectasis noted in left lower lobe posteriorly. There is consolidation with air bronchogram in right lower lobe and right middle lobe highly suspicious for infiltrate/pneumonia. 3. Mild perihilar bronchitic changes. 4. No mediastinal hematoma or adenopathy. 5. There is a catheter fragment in main pulmonary artery extending from just above the origin in right main pulmonary artery and a small branch. The catheter measures at least 12.5 cm in length. This was only partially visualized on prior exam stable in position from prior exam. These results were called by telephone at the time of interpretation on 11/05/2015 at 1:57 pm to Dr. Reyne Dumas , who verbally acknowledged these results. Electronically Signed   By: Lahoma Crocker M.D.   On: 11/05/2015 14:02   US Thoracentesis Asp Pleural Space W/img Guide: 11/04/2015  INDICATION: Symptomatic right sided pleural effusion EXAM: US THORACENTESIS ASP PLEURAL SPACE W/IMG GUIDE COMPARISON:  CXR 11/04/2015. MEDICATIONS: None COMPLICATIONS: None immediate TECHNIQUE:  Informed written consent was obtained from the patient after a discussion of the risks, benefits and alternatives to treatment. A timeout was performed prior to the initiation of the  procedure. Initial ultrasound scanning demonstrates a right pleural effusion. The lower chest was prepped and draped in the usual sterile fashion. 1% lidocaine was used for local anesthesia. An ultrasound image was saved for documentation purposes. A 6 Fr Safe-T-Centesis catheter was introduced. The thoracentesis was performed. The catheter was removed and a dressing was applied. The patient tolerated the procedure well without immediate post procedural complication. The patient was escorted to have an upright chest radiograph. FINDINGS: A total of approximately 1.4 Liters of yellow fluid was removed. Requested samples were sent to the laboratory. IMPRESSION: Successful ultrasound-guided right sided thoracentesis yielding 1.4 liters of pleural fluid. Read By:  Tsosie Billing PA-C Electronically Signed   By: Lucrezia Europe M.D.   On: 11/04/2015 16:31    ECG: Atrial fibrillation with rate in low 100's.   ECHOCARDIOGRAM:  Study Conclusions - Left ventricle: The cavity size was normal. Systolic function was severely reduced. The estimated ejection fraction was in the range of 25% to 30%. Diffuse hypokinesis. - Aortic valve: Trileaflet; mildly thickened, mildly calcified leaflets. Cusp separation was reduced. - Aorta: The aorta was moderately calcified. - Mitral valve: Moderately calcified annulus. There was mild regurgitation. - Left atrium: The atrium was moderately dilated. - Right ventricle: The cavity size was moderately dilated. Wall thickness was normal. Systolic function was moderately reduced. - Right atrium: The atrium was severely dilated. - Tricuspid valve: There was moderate regurgitation. - Pulmonic valve: There was moderate regurgitation.  Impressions: - EF is reduced when compared to prior study  (50%).  ASSESSMENT AND PLAN  1. Acute Systolic Congestive Heart Failure - EF of 25-30% this admission with diffuse hypokinesis. Previous EF was 50% in 02/2012 and showed hypokinesis of the basal inferior segment, basal inferolateral segment, and mid inferolateral segment. - likely secondary to chronic atrial fibrillation over the recent months but could consider an ischemic cause as he does have coronary calcifications on CT and has never had a catheterization. However, he denies ever having chest pain in the past. - had peripheral edema at time of admission. Was on Lasix 20mg  every other day PTA. Received one dose of Lasix 40mg  IV this AM. Net negative -300 mL this admission. Lower extremity edema has improved. Still has JVD. - continue Coreg 6.25mg  BID.  2. Paroxysmal Atrial Fibrillation - failed Tikosyn in the past. Had 3 DCCV, with most recent being in 07/2015 at which he was noted to be in atrial fibrillation again on 09/02/2015. Decided at that time for him to remain in atrial fibrillation at that time for he was asymptomatic. - has been experiencing shortness of breath with exertion over the past two months. - This patients CHA2DS2-VASc Score and unadjusted Ischemic Stroke Rate (% per year) is equal to 9.7 % stroke rate/year from a score of 6 (CHF, HTN, DM, Vascular, Age x2). Continue Eliquis for anticoagulation. - HR uncontrolled with rate in the 160's at times. Currently on Coreg 6.25mg  BID. Would not titrate at this time due to hypotension. Dr. Caryl Comes had mentioned trying Digoxin for rate control if needed.  - unsure if patient would be candidate for ablation as he is symptomatic currently and failed Tikosyn and multiple DCCV attempts.  3. Type 2 DM - per admitting team  4. HTN - BP has been soft at 90/56 - 94/67 in the past 24 hours  5. Hypothyroidism - TSH 6.858, T4 1.00. - per admitting team   6. Right Pleural Effusion - thoaracentesis was performed on 11/04/2015 with 1.4L of  fluid removed. - CTA on 11/05/2015 showed a persistent moderate size right pleural effusion and small left pleural effusion    Signed, Erma Heritage, PA-C 11/06/2015, 10:33 AM Pager: [phone]

## 2015-11-06 NOTE — Progress Notes (Signed)
CRITICAL VALUE ALERT  Critical value received: cbg 50, orange juice 4 oz, given. Rechecked after 15 min, cbg is 64  Date of notification:  11/06/15  Time of notification: 06:30  Critical value read back:yes  Nurse who received alert:  Greysen Devino  MD notified (1st page):   Time of first page:  MD notified (2nd page):  Time of second page:  Responding MD: Time MD responded:

## 2015-11-06 NOTE — Progress Notes (Addendum)
[facility] PROGRESS NOTE  KOHLTEN MANIERI [id number] DOB: 1925/10/09 DOA: 11/04/2015 PCP: [name], MD  Length of stay: 2   Assessment/Plan: Principal Problem:   Pleural effusion Active Problems:   Type II diabetes mellitus with manifestations ([place])   Persistent atrial fibrillation (HCC)   CAD (coronary artery disease)   Hypothyroid   Acute on chronic heart failure (HCC)   S/P thoracentesis   SOB (shortness of breath)    Brief summary 81 y.o. male, with permanent atrial fibrillation on Eliquis, diabetes mellitus, seizures, and history of Whipple procedure who presents to the emergency department with shortness of breath. Mr. Delarm saw his primary care physician this morning for cough and shortness of breath and was sent to the emergency department after an x-ray showed a large right pleural effusion. Mr. Lovin reports that he has been feeling fatigued and badly for about the past month. But over the last week he has become much worse. He is no longer able to walk his daily 1/2 mile. He is unable to sleep. He has a dry cough but occasionally brings up frothy sputum. He also coughs when he eats or drinks liquids, +orthopnea, +PND, +insomnia. The patient reports he was taken off of amiodarone approximately 3 mos ago, and was told by his cardiologist, Dr. Caryl Comes, that his afib was going to be permanent.   Assessment and plan Right-sided pleural effusion Uncertain etiology: Possibly heart failure versus, pneumonia (less likely),vs  malignancy. Appears to be transudative in nature, await cytology results ,Continue IV Lasix in an attempt to gently/carefully diurese. Unfortunately his blood pressure is already somewhat soft.  S/p  thoracentesis was performed. They removed 1.4 liters fluid on 12/7  Check serum LDH  CT chest shows a moderate-sized right pleural effusion, questionable right lower lobe pneumonia Chronic fragment from a catheter measuring 12.5 cm which was  present on prior CT Patient may need repeat thoracentesis, radiology to make that decision Speech therapy consultation to rule out aspiration Empirically started on levofloxacin 5 days, although I doubt that the patient has a pneumonia clinically no fever no cough no white count  Possible diastolic versus systolic heart failure? Patient appears in mild volume overload. + JVD, +1 bilateral lower extremity edema. Repeat  2-D echocardiogram shows EF of 25-30%. , Previously 50%, cardiology consulted  Continue Coreg. No ACE inhibitor as blood pressure soft.  Repeat IV Lasix   Persistent atrial fibrillation Currently not well controlled with rates between 100-125. Continue Eliquis, Coreg, add diltiazem PRN.  Type 2 diabetes Dc Levemir and continue NovoLog.  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 Continue Creon supplementation.  Hypothyroidism Not currently on thyroid hormone supplementation?  TSH 6.85. Check FT4     DVT prophylaxsis eliquis   Code Status:      Code Status Orders        Start     Ordered   11/04/15 1629  Full code   Continuous     11/04/15 1628    Advance Directive Documentation        Most Recent Value   Type of Advance Directive  Healthcare Power of Attorney   Pre-existing out of facility DNR order (yellow form or pink MOST form)     "MOST" Form in Place?        Family Communication: Discussed in detail with the patient, all imaging results, lab results explained to the patient   Disposition Plan: 2-3 days  Consultants:  None  Procedures:  Thoracentesis  Antibiotics: Anti-infectives    Start     Dose/Rate Route Frequency Ordered Stop   11/06/15 1100  levofloxacin (LEVAQUIN) IVPB 500 mg     500 mg 100 mL/hr over 60 Minutes Intravenous Every 24 hours 11/06/15 0949           HPI/Subjective: Patient denies any subjective feeling of shortness of breath   Objective: Filed Vitals:   11/05/15  0403 11/05/15 1414 11/05/15 2134 11/06/15 0544  BP: 95/57 94/67 91/56  90/57  Pulse: 115 93 115 73  Temp: 97.9 F (36.6 C) 97.6 F (36.4 C) 97.8 F (36.6 C) 97.6 F (36.4 C)  TempSrc: Oral Oral Oral Oral  Resp: 20 18 18 18   Height:      Weight: 79.606 kg (175 lb 8 oz)   79.788 kg (175 lb 14.4 oz)  SpO2: 95% 95% 95% 93%    Intake/Output Summary (Last 24 hours) at 11/06/15 1117 Last data filed at 11/06/15 [id number]  Gross per 24 hour  Intake    960 ml  Output    500 ml  Net    460 ml    Exam:  General: No acute respiratory distress Lungs: Clear to auscultation bilaterally without wheezes or crackles Cardiovascular: Regular rate and rhythm without murmur gallop or rub normal S1 and S2 Abdomen: Nontender, nondistended, soft, bowel sounds positive, no rebound, no ascites, no appreciable mass Extremities: No significant cyanosis, clubbing, or edema bilateral lower extremities     Data Review   Micro Results Recent Results (from the past 240 hour(s))  Culture, body fluid-bottle     Status: None (Preliminary result)   Collection Time: 11/04/15  4:18 PM  Result Value Ref Range Status   Specimen Description FLUID PLEURAL RIGHT  Final   Special Requests BOTTLES DRAWN AEROBIC AND ANAEROBIC 10CC  Final   Culture NO GROWTH < 24 HOURS  Final   Report Status PENDING  Incomplete  Gram stain     Status: None   Collection Time: 11/04/15  4:18 PM  Result Value Ref Range Status   Specimen Description FLUID PLEURAL RIGHT  Final   Special Requests NONE  Final   Gram Stain   Final    WBC PRESENT, PREDOMINANTLY MONONUCLEAR NO ORGANISMS SEEN CYTOSPIN    Report Status 11/04/2015 FINAL  Final  MRSA PCR Screening     Status: None   Collection Time: 11/05/15  8:19 PM  Result Value Ref Range Status   MRSA by PCR NEGATIVE NEGATIVE Final    Comment:        The GeneXpert MRSA Assay (FDA approved for NASAL specimens only), is one component of a comprehensive MRSA colonization surveillance  program. It is not intended to diagnose MRSA infection nor to guide or monitor treatment for MRSA infections.     Radiology Reports Dg Chest 1 View  11/04/2015  CLINICAL DATA:  Initial encounter for status post right thoracentesis. EXAM: CHEST 1 VIEW COMPARISON:  11/04/2015, earlier the same day. FINDINGS: Interval decrease in right pleural effusion no evidence for pneumothorax. Stable appearance of catheter tubing over the medial right hemi thorax and heart silhouette. Left lung remains clear. The cardio pericardial silhouette is enlarged. Telemetry leads overlie the chest. IMPRESSION: No evidence for pneumothorax status post thoracentesis. Electronically Signed   By: Misty Stanley M.D.   On: 11/04/2015 16:56   Dg Chest 2 View  11/05/2015  CLINICAL DATA:  History of recent thoracentesis forl reduction of right  pleural effusion, cough and congestion EXAM: CHEST  2 VIEW COMPARISON:  Portable chest x-ray of 11/04/2015 FINDINGS: After right thoracentesis, there is little change in the small right pleural effusion remaining with right basilar atelectasis. The left lung is clear. Cardiomegaly is stable. No pneumothorax is seen. IMPRESSION: No significant change after right thoracentesis in the volume of the right pleural effusion with right basilar atelectasis. Electronically Signed   By: Ivar Drape M.D.   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 EXAM: CHEST  2 VIEW COMPARISON:  PA and lateral chest x-ray of June 22, 2013 FINDINGS: There is a new moderate-sized right pleural effusion occupying 1/2 of the right hemithorax. Right basilar atelectasis or pneumonia may be obscured. There is small amount of atelectasis or infiltrate at the left lung base medially. A trace of pleural fluid on the left is suspected. The cardiac silhouette is  mildly enlarged. The pulmonary vascularity is normal. The bony thorax exhibits no acute abnormality. IMPRESSION: Since the previous study the patient has developed a moderate sized right pleural effusion. This may be obscuring right basilar atelectasis, pneumonia, or mass. There is a trace left pleural effusion and minimal left basilar atelectasis or infiltrate. There is no evidence of CHF. Chest CT scanning is recommended. Electronically Signed   By: David  Martinique M.D.   On: 11/04/2015 11:30   Ct Angio Chest Pe W/cm &/or Wo Cm  11/05/2015  CLINICAL DATA:  Shortness of breath, pleural effusion. 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 Multiplanar CT image reconstructions and MIPs were obtained to evaluate the vascular anatomy. CONTRAST:  72mL OMNIPAQUE IOHEXOL 350 MG/ML SOLN COMPARISON:  CT abdomen 12/16/ 15 images of the lung bases FINDINGS: Sagittal images of the spine shows osteopenia and degenerative changes thoracic spine. Sagittal view of the sternum is unremarkable. Atherosclerotic calcifications of thoracic aorta and coronary arteries. The study is of excellent technical quality. No pulmonary embolus is noted. Bilateral perihilar peribronchial thickening is noted. There is no mediastinal hematoma or adenopathy. Images of the upper abdomen shows partially visualized postsurgical changes post Whipple procedure. There is moderate size right pleural effusion. Small left pleural effusion. There is atelectasis in left lower lobe posteriorly. There is patchy airspace disease/infiltrate in right middle lobe and right lower lobe. Findings are highly suspicious for pneumonia. There is a catheter fragment in main pulmonary artery extending from just above the origin in right main pulmonary artery and a small branch. The  catheter measures at least 12.5 cm in length. This was only partially visualized on prior exam stable in position from prior exam. Review of the MIP images confirms the above findings. IMPRESSION: 1. No pulmonary embolus. 2. There is moderate size right pleural effusion. Small left pleural effusion. Atelectasis noted in left lower lobe posteriorly. There is consolidation with air bronchogram in right lower lobe and right middle lobe highly suspicious for infiltrate/pneumonia. 3. Mild perihilar bronchitic changes. 4. No mediastinal hematoma or adenopathy. 5. There is a catheter fragment in main pulmonary artery extending from just above the origin in right main pulmonary artery and a small branch. The catheter measures at least 12.5 cm in length. This was only partially visualized on prior exam  stable in position from prior exam. These results were called by telephone at the time of interpretation on 11/05/2015 at 1:57 pm to Dr. Reyne Dumas , who verbally acknowledged these results. Electronically Signed   By: Lahoma Crocker M.D.   On: 11/05/2015 14:02   Dg Swallowing Func-speech Pathology  11/06/2015  Objective Swallowing Evaluation:   Patient Details Name: CALIB WEGLEY MRN: [id number] Date of Birth: Aug 30, 1925 Today's Date: 11/06/2015 Time: SLP Start Time (ACUTE ONLY): 1009-SLP Stop Time (ACUTE ONLY): 1020 SLP Time Calculation (min) (ACUTE ONLY): 11 min Past Medical History: Past Medical History Diagnosis Date . Acid reflux disease    history of . H/O: GI bleed  . Paroxysmal atrial fibrillation (HCC)    chronic anticoag; tikosyn . Benign prostatic hypertrophy    history of . Hepatic damage march 2009   retained hepatic stone , intrahepatic duct catheter . CAD (coronary artery disease)  . Hypertension  . 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 Seizures ([place])  . Ventricular  tachycardia ? Tikosyn proarrhtyhmia  . Cataracts, bilateral  . Diabetes mellitus    insulin dep . Pleural effusion 10/2015 . Shortness of breath dyspnea  Past Surgical History: Past Surgical History Procedure Laterality Date . Cardioversion   . Cholecystectomy     history of . Partial gastrectomy     history of . Shoulder surgery     left shoulder repair with 2 pens inserted . Whipple procedure     operation . 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 Inguinal hernia repair     inguinal herniorrhaphy, right... history of . Appendectomy     history of . Cardioversion  03/07/2012   Procedure: CARDIOVERSION;  Surgeon: Deboraha Sprang, MD;  Location: [facility];  Service: Cardiovascular;  Laterality: N/A; . Cardioversion N/A 08/10/2015   Procedure: CARDIOVERSION;  Surgeon: Thayer Headings, MD;  Location: [facility] ENDOSCOPY;  Service: Cardiovascular;  Laterality: N/A; HPI: Macklyn Parpart is a 81 y.o. male, with permanent atrial fibrillation on Eliquis, diabetes mellitus, seizures, and history of Whipple procedure who presents to the emergency department with shortness of breath. Mr. Stockhausen saw his primary care physician for cough and shortness of breath and was sent to the emergency department after an x-ray showed a large right pleural effusion. Mr. Gochnauer reports that he has been feeling fatigued and badly for about the past month. But over the last week he has become much worse. He is no longer able to walk his daily 1/2 mile. He is unable to sleep. He has a dry cough but occasionally brings up frothy sputum. He also coughs when he eats or drinks liquids,  No Data Recorded Assessment / Plan / Recommendation CHL IP CLINICAL IMPRESSIONS 11/06/2015 Therapy Diagnosis Moderate pharyngeal phase dysphagia Clinical Impression Pt presents with moderate pharyngeal phase dysphagia characterized by one incidence of flash penetration with larger boluses of liquids, but clearance noted without strategies utilized; pt also exhibited moderate vallecular  residue with puree-solid consistencies which required multiple repetitive swallows, effortful swallows and alternating solids/liquids to clear valleculae which place him at a moderate aspiration risk without use of strategies during po intake; decreased epiglottic inversion noted intermittently during study as well which also increases his aspiration risk.  Pt did not aspirate any consistency, but is certainly at risk if strategies are not utilized during po intake; ST  to f/u during hospitalization for diet tolerance/pt education re: aspiration risk/swallowing strategy use and should be f/u with SLP at SNF. Impact on safety and function Moderate aspiration risk   CHL IP TREATMENT RECOMMENDATION 11/06/2015 Treatment Recommendations Therapy as outlined in treatment plan below   Prognosis 11/06/2015 Prognosis for Safe Diet Advancement Good Barriers to Reach Goals -- Barriers/Prognosis Comment -- CHL IP DIET RECOMMENDATION 11/06/2015 SLP Diet Recommendations Regular solids;Thin liquid Liquid Administration via Cup Medication Administration Whole meds with liquid Compensations Slow rate;Small sips/bites;Clear throat intermittently;Effortful swallow Postural Changes Seated upright at 90 degrees   CHL IP OTHER RECOMMENDATIONS 11/06/2015 Recommended Consults -- Oral Care Recommendations Oral care BID Other Recommendations --   CHL IP FOLLOW UP RECOMMENDATIONS 11/05/2015 Follow up Recommendations ST f/u at SNF upon d/c   CHL IP FREQUENCY AND DURATION 11/06/2015 Speech Therapy Frequency (ACUTE ONLY) min 2x/week Treatment Duration 1 week      CHL IP ORAL PHASE 11/06/2015 Oral Phase WFL Oral - Pudding Teaspoon -- Oral - Pudding Cup -- Oral - Honey Teaspoon -- Oral - Honey Cup -- Oral - Nectar Teaspoon -- Oral - Nectar Cup -- Oral - Nectar Straw -- Oral - Thin Teaspoon -- Oral - Thin Cup -- Oral - Thin Straw -- Oral - Puree -- Oral - Mech Soft -- Oral - Regular -- Oral - Multi-Consistency -- Oral - Pill -- Oral Phase - Comment --  CHL  IP PHARYNGEAL PHASE 11/06/2015 Pharyngeal Phase Impaired Pharyngeal- Pudding Teaspoon -- Pharyngeal -- Pharyngeal- Pudding Cup -- Pharyngeal -- Pharyngeal- Honey Teaspoon -- Pharyngeal -- Pharyngeal- Honey Cup -- Pharyngeal -- Pharyngeal- Nectar Teaspoon -- Pharyngeal -- Pharyngeal- Nectar Cup -- Pharyngeal -- Pharyngeal- Nectar Straw -- Pharyngeal -- Pharyngeal- Thin Teaspoon -- Pharyngeal -- Pharyngeal- Thin Cup Penetration/Aspiration during swallow;Other (Comment) Pharyngeal (No Data) Pharyngeal- Thin Straw -- Pharyngeal -- Pharyngeal- Puree Reduced epiglottic inversion;Pharyngeal residue - valleculae;Compensatory strategies attempted (with notebox) Pharyngeal -- Pharyngeal- Mechanical Soft -- Pharyngeal -- Pharyngeal- Regular Reduced epiglottic inversion;Pharyngeal residue - valleculae;Compensatory strategies attempted (with notebox) Pharyngeal -- Pharyngeal- Multi-consistency -- Pharyngeal -- Pharyngeal- Pill -- Pharyngeal -- Pharyngeal Comment --  CHL IP CERVICAL ESOPHAGEAL PHASE 11/06/2015 Cervical Esophageal Phase WFL Pudding Teaspoon -- Pudding Cup -- Honey Teaspoon -- Honey Cup -- Nectar Teaspoon -- Nectar Cup -- Nectar Straw -- Thin Teaspoon -- Thin Cup -- Thin Straw -- Puree -- Mechanical Soft -- Regular -- Multi-consistency -- Pill -- Cervical Esophageal Comment -- ADAMS,PAT, M.S., CCC-SLP 11/06/2015, 10:31 AM              US Thoracentesis Asp Pleural Space W/img Guide  11/04/2015  INDICATION: Symptomatic right sided pleural effusion EXAM: US THORACENTESIS ASP PLEURAL SPACE W/IMG GUIDE COMPARISON:  CXR 11/04/2015. MEDICATIONS: None COMPLICATIONS: None immediate TECHNIQUE: Informed written consent was obtained from the patient after a discussion of the risks, benefits and alternatives to treatment. A timeout was performed prior to the initiation of the procedure. Initial ultrasound scanning demonstrates a right pleural effusion. The lower chest was prepped and draped in the usual sterile fashion. 1%  lidocaine was used for local anesthesia. An ultrasound image was saved for documentation purposes. A 6 Fr Safe-T-Centesis catheter was introduced. The thoracentesis was performed. The catheter was removed and a dressing was applied. The patient tolerated the procedure well without immediate post procedural complication. The patient was escorted to have an upright chest radiograph. FINDINGS: A total of approximately 1.4 Liters of yellow fluid was removed. Requested samples were sent to the laboratory. IMPRESSION: Successful  ultrasound-guided right sided thoracentesis yielding 1.4 liters of pleural fluid. Read By:  Tsosie Billing PA-C Electronically Signed   By: Lucrezia Europe M.D.   On: 11/04/2015 16:31     CBC  Recent Labs Lab 11/04/15 1100 11/04/15 1312 11/06/15 0233  WBC 7.4 7.1 6.9  HGB 12.8* 11.8* 11.9*  HCT 39.5 35.6* 35.3*  PLT 167.0 151 144*  MCV 96.9 95.7 95.9  MCH  --  31.7 32.3  MCHC 32.5 33.1 33.7  RDW 14.8 13.8 13.8  LYMPHSABS 1.0 1.1  --   MONOABS 1.0 1.1*  --   EOSABS 0.5 0.4  --   BASOSABS 0.0 0.0  --     Chemistries   Recent Labs Lab 11/04/15 1100 11/04/15 1312 11/05/15 0812 11/06/15 0233  NA 137 135 135 134*  K 5.1 4.5 4.2 3.7  CL 103 104 103 103  CO2 27 26 24 27   GLUCOSE 140* 115* 153* 103*  BUN 20 18 19 17   CREATININE 1.16 1.16 1.14 1.15  CALCIUM 9.0 8.7* 8.3* 8.3*  MG  --  2.0  --   --   AST  --  28  --  25  ALT  --  18  --  17  ALKPHOS  --  123  --  115  BILITOT  --  0.8  --  0.6   ------------------------------------------------------------------------------------------------------------------ estimated creatinine clearance is 50.1 mL/min (by C-G formula based on Cr of 1.15). ------------------------------------------------------------------------------------------------------------------ No results for input(s): HGBA1C in the last 72  hours. ------------------------------------------------------------------------------------------------------------------ No results for input(s): CHOL, HDL, LDLCALC, TRIG, CHOLHDL, LDLDIRECT in the last 72 hours. ------------------------------------------------------------------------------------------------------------------  Recent Labs  11/04/15 1943  TSH 6.858*   ------------------------------------------------------------------------------------------------------------------ No results for input(s): VITAMINB12, FOLATE, FERRITIN, TIBC, IRON, RETICCTPCT in the last 72 hours.  Coagulation profile No results for input(s): INR, PROTIME in the last 168 hours.   Recent Labs  11/04/15 1100  DDIMER 0.63*    Cardiac Enzymes  Recent Labs Lab 11/04/15 1300  CKMB 2.5   ------------------------------------------------------------------------------------------------------------------ Invalid input(s): POCBNP   CBG:  Recent Labs Lab 11/05/15 1343 11/05/15 1635 11/05/15 2132 11/06/15 0629 11/06/15 0657  GLUCAP 143* 84 103* 50* 79       Studies: Dg Chest 1 View  11/04/2015  CLINICAL DATA:  Initial encounter for status post right thoracentesis. EXAM: CHEST 1 VIEW COMPARISON:  11/04/2015, earlier the same day. FINDINGS: Interval decrease in right pleural effusion no evidence for pneumothorax. Stable appearance of catheter tubing over the medial right hemi thorax and heart silhouette. Left lung remains clear. The cardio pericardial silhouette is enlarged. Telemetry leads overlie the chest. IMPRESSION: No evidence for pneumothorax status post thoracentesis. Electronically Signed   By: Misty Stanley M.D.   On: 11/04/2015 16:56   Dg Chest 2 View  11/05/2015  CLINICAL DATA:  History of recent thoracentesis forl reduction of right pleural effusion, cough and congestion EXAM: CHEST  2 VIEW COMPARISON:  Portable chest x-ray of 11/04/2015 FINDINGS: After right thoracentesis, there is  little change in the small right pleural effusion remaining with right basilar atelectasis. The left lung is clear. Cardiomegaly is stable. No pneumothorax is seen. IMPRESSION: No significant change after right thoracentesis in the volume of the right pleural effusion with right basilar atelectasis. Electronically Signed   By: Ivar Drape M.D.   On: 11/05/2015 08:00   Dg Chest 2 View  11/04/2015  CLINICAL DATA:  Shortness of breath, nonproductive cough for the past week; history of coronary artery disease diabetes and  bile duct malignancy; former smoker. EXAM: CHEST  2 VIEW COMPARISON:  PA and lateral chest x-ray of June 22, 2013 FINDINGS: There is a new moderate-sized right pleural effusion occupying 1/2 of the right hemithorax. Right basilar atelectasis or pneumonia may be obscured. There is small amount of atelectasis or infiltrate at the left lung base medially. A trace of pleural fluid on the left is suspected. The cardiac silhouette is mildly enlarged. The pulmonary vascularity is normal. The bony thorax exhibits no acute abnormality. IMPRESSION: Since the previous study the patient has developed a moderate sized right pleural effusion. This may be obscuring right basilar atelectasis, pneumonia, or mass. There is a trace left pleural effusion and minimal left basilar atelectasis or infiltrate. There is no evidence of CHF. Chest CT scanning is recommended. Electronically Signed   By: David  Martinique M.D.   On: 11/04/2015 11:30   Ct Angio Chest Pe W/cm &/or Wo Cm  11/05/2015  CLINICAL DATA:  Shortness of breath, pleural effusion. 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 Multiplanar CT image reconstructions and MIPs were obtained to evaluate the vascular anatomy. CONTRAST:  68mL OMNIPAQUE IOHEXOL 350 MG/ML SOLN  COMPARISON:  CT abdomen 12/16/ 15 images of the lung bases FINDINGS: Sagittal images of the spine shows osteopenia and degenerative changes thoracic spine. Sagittal view of the sternum is unremarkable. Atherosclerotic calcifications of thoracic aorta and coronary arteries. The study is of excellent technical quality. No pulmonary embolus is noted. Bilateral perihilar peribronchial thickening is noted. There is no mediastinal hematoma or adenopathy. Images of the upper abdomen shows partially visualized postsurgical changes post Whipple procedure. There is moderate size right pleural effusion. Small left pleural effusion. There is atelectasis in left lower lobe posteriorly. There is patchy airspace disease/infiltrate in right middle lobe and right lower lobe. Findings are highly suspicious for pneumonia. There is a catheter fragment in main pulmonary artery extending from just above the origin in right main pulmonary artery and a small branch. The catheter measures at least 12.5 cm in length. This was only partially visualized on prior exam stable in position from prior exam. Review of the MIP images confirms the above findings. IMPRESSION: 1. No pulmonary embolus. 2. There is moderate size right pleural effusion. Small left pleural effusion. Atelectasis noted in left lower lobe posteriorly. There is consolidation with air bronchogram in right lower lobe and right middle lobe highly suspicious for infiltrate/pneumonia. 3. Mild perihilar bronchitic changes. 4. No mediastinal hematoma or adenopathy. 5. There is a catheter fragment in main pulmonary artery extending from just above the origin in right main pulmonary artery and a small branch. The catheter measures at least 12.5 cm in length. This was only partially visualized on prior exam stable in position from prior exam. These results were called by telephone at the time of interpretation on 11/05/2015 at 1:57 pm to Dr. Reyne Dumas , who verbally acknowledged these  results. Electronically Signed   By: Lahoma Crocker M.D.   On: 11/05/2015 14:02   Dg Swallowing Func-speech Pathology  11/06/2015  Objective Swallowing Evaluation:   Patient Details Name: CASSIDY DIEDRICH MRN: [id number] Date of Birth: 07/23/1925 Today's Date: 11/06/2015 Time: SLP Start Time (ACUTE ONLY): 1009-SLP Stop Time (ACUTE ONLY): 1020 SLP Time Calculation (min) (ACUTE ONLY): 11 min Past Medical History: Past Medical History Diagnosis Date . Acid reflux disease  history of . H/O: GI bleed  . Paroxysmal atrial fibrillation (HCC)    chronic anticoag; tikosyn . Benign prostatic hypertrophy    history of . Hepatic damage march 2009   retained hepatic stone , intrahepatic duct catheter . CAD (coronary artery disease)  . Hypertension  . 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 Seizures ([place])  . Ventricular tachycardia ? Tikosyn proarrhtyhmia  . Cataracts, bilateral  . Diabetes mellitus    insulin dep . Pleural effusion 10/2015 . Shortness of breath dyspnea  Past Surgical History: Past Surgical History Procedure Laterality Date . Cardioversion   . Cholecystectomy     history of . Partial gastrectomy     history of . Shoulder surgery     left shoulder repair with 2 pens inserted . Whipple procedure     operation . 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 Inguinal hernia repair     inguinal herniorrhaphy, right... history of . Appendectomy     history of . Cardioversion  03/07/2012   Procedure: CARDIOVERSION;  Surgeon: Deboraha Sprang, MD;  Location: [facility];  Service: Cardiovascular;  Laterality: N/A; . Cardioversion N/A 08/10/2015   Procedure: CARDIOVERSION;  Surgeon: Thayer Headings, MD;  Location: [facility] ENDOSCOPY;  Service: Cardiovascular;  Laterality: N/A; HPI: Besnik Character is a 81 y.o. male, with permanent atrial fibrillation on Eliquis, diabetes mellitus, seizures, and history of Whipple  procedure who presents to the emergency department with shortness of breath. Mr. Mattucci saw his primary care physician for cough and shortness of breath and was sent to the emergency department after an x-ray showed a large right pleural effusion. Mr. Derienzo reports that he has been feeling fatigued and badly for about the past month. But over the last week he has become much worse. He is no longer able to walk his daily 1/2 mile. He is unable to sleep. He has a dry cough but occasionally brings up frothy sputum. He also coughs when he eats or drinks liquids,  No Data Recorded Assessment / Plan / Recommendation CHL IP CLINICAL IMPRESSIONS 11/06/2015 Therapy Diagnosis Moderate pharyngeal phase dysphagia Clinical Impression Pt presents with moderate pharyngeal phase dysphagia characterized by one incidence of flash penetration with larger boluses of liquids, but clearance noted without strategies utilized; pt also exhibited moderate vallecular residue with puree-solid consistencies which required multiple repetitive swallows, effortful swallows and alternating solids/liquids to clear valleculae which place him at a moderate aspiration risk without use of strategies during po intake; decreased epiglottic inversion noted intermittently during study as well which also increases his aspiration risk.  Pt did not aspirate any consistency, but is certainly at risk if strategies are not utilized during po intake; ST to f/u during hospitalization for diet tolerance/pt education re: aspiration risk/swallowing strategy use and should be f/u with SLP at SNF. Impact on safety and function Moderate aspiration risk   CHL IP TREATMENT RECOMMENDATION 11/06/2015 Treatment Recommendations Therapy as outlined in treatment plan below   Prognosis 11/06/2015 Prognosis for Safe Diet Advancement Good Barriers to Reach Goals -- Barriers/Prognosis Comment -- CHL IP DIET RECOMMENDATION 11/06/2015 SLP Diet Recommendations Regular solids;Thin liquid  Liquid Administration via Cup Medication Administration Whole meds with liquid Compensations Slow rate;Small sips/bites;Clear throat intermittently;Effortful swallow Postural Changes Seated upright at 90 degrees   CHL IP OTHER RECOMMENDATIONS 11/06/2015 Recommended Consults -- Oral Care  Recommendations Oral care BID Other Recommendations --   CHL IP FOLLOW UP RECOMMENDATIONS 11/05/2015 Follow up Recommendations ST f/u at SNF upon d/c   CHL IP FREQUENCY AND DURATION 11/06/2015 Speech Therapy Frequency (ACUTE ONLY) min 2x/week Treatment Duration 1 week      CHL IP ORAL PHASE 11/06/2015 Oral Phase WFL Oral - Pudding Teaspoon -- Oral - Pudding Cup -- Oral - Honey Teaspoon -- Oral - Honey Cup -- Oral - Nectar Teaspoon -- Oral - Nectar Cup -- Oral - Nectar Straw -- Oral - Thin Teaspoon -- Oral - Thin Cup -- Oral - Thin Straw -- Oral - Puree -- Oral - Mech Soft -- Oral - Regular -- Oral - Multi-Consistency -- Oral - Pill -- Oral Phase - Comment --  CHL IP PHARYNGEAL PHASE 11/06/2015 Pharyngeal Phase Impaired Pharyngeal- Pudding Teaspoon -- Pharyngeal -- Pharyngeal- Pudding Cup -- Pharyngeal -- Pharyngeal- Honey Teaspoon -- Pharyngeal -- Pharyngeal- Honey Cup -- Pharyngeal -- Pharyngeal- Nectar Teaspoon -- Pharyngeal -- Pharyngeal- Nectar Cup -- Pharyngeal -- Pharyngeal- Nectar Straw -- Pharyngeal -- Pharyngeal- Thin Teaspoon -- Pharyngeal -- Pharyngeal- Thin Cup Penetration/Aspiration during swallow;Other (Comment) Pharyngeal (No Data) Pharyngeal- Thin Straw -- Pharyngeal -- Pharyngeal- Puree Reduced epiglottic inversion;Pharyngeal residue - valleculae;Compensatory strategies attempted (with notebox) Pharyngeal -- Pharyngeal- Mechanical Soft -- Pharyngeal -- Pharyngeal- Regular Reduced epiglottic inversion;Pharyngeal residue - valleculae;Compensatory strategies attempted (with notebox) Pharyngeal -- Pharyngeal- Multi-consistency -- Pharyngeal -- Pharyngeal- Pill -- Pharyngeal -- Pharyngeal Comment --  CHL IP CERVICAL ESOPHAGEAL  PHASE 11/06/2015 Cervical Esophageal Phase WFL Pudding Teaspoon -- Pudding Cup -- Honey Teaspoon -- Honey Cup -- Nectar Teaspoon -- Nectar Cup -- Nectar Straw -- Thin Teaspoon -- Thin Cup -- Thin Straw -- Puree -- Mechanical Soft -- Regular -- Multi-consistency -- Pill -- Cervical Esophageal Comment -- ADAMS,PAT, M.S., CCC-SLP 11/06/2015, 10:31 AM              US Thoracentesis Asp Pleural Space W/img Guide  11/04/2015  INDICATION: Symptomatic right sided pleural effusion EXAM: US THORACENTESIS ASP PLEURAL SPACE W/IMG GUIDE COMPARISON:  CXR 11/04/2015. MEDICATIONS: None COMPLICATIONS: None immediate TECHNIQUE: Informed written consent was obtained from the patient after a discussion of the risks, benefits and alternatives to treatment. A timeout was performed prior to the initiation of the procedure. Initial ultrasound scanning demonstrates a right pleural effusion. The lower chest was prepped and draped in the usual sterile fashion. 1% lidocaine was used for local anesthesia. An ultrasound image was saved for documentation purposes. A 6 Fr Safe-T-Centesis catheter was introduced. The thoracentesis was performed. The catheter was removed and a dressing was applied. The patient tolerated the procedure well without immediate post procedural complication. The patient was escorted to have an upright chest radiograph. FINDINGS: A total of approximately 1.4 Liters of yellow fluid was removed. Requested samples were sent to the laboratory. IMPRESSION: Successful ultrasound-guided right sided thoracentesis yielding 1.4 liters of pleural fluid. Read By:  Tsosie Billing PA-C Electronically Signed   By: Lucrezia Europe M.D.   On: 11/04/2015 16:31      Lab Results  Component Value Date   HGBA1C 6.5 10/21/2015   HGBA1C 6.0 06/24/2015   HGBA1C 6.3 02/25/2015   Lab Results  Component Value Date   MICROALBUR <0.7 02/25/2015   LDLCALC 56 10/21/2015   CREATININE 1.15 11/06/2015       Scheduled Meds: . acidophilus  1  capsule Oral Daily  . apixaban  2.5 mg Oral BID  . azelastine  2 spray Each Nare  BID  . carvedilol  6.25 mg Oral BID  . cetaphil   Topical Daily  . finasteride  5 mg Oral Daily  . furosemide  40 mg Intravenous Once  . insulin aspart  0-9 Units Subcutaneous TID WC  . insulin aspart  3 Units Subcutaneous TID WC  . levofloxacin (LEVAQUIN) IV  500 mg Intravenous Q24H  . lipase/protease/amylase  12,000 Units Oral TID AC  . multivitamin with minerals  1 tablet Oral Daily  . sodium chloride  1 spray Each Nare BID  . sodium chloride  3 mL Intravenous Q12H  . tamsulosin  0.4 mg Oral BID   Continuous Infusions:   Principal Problem:   Pleural effusion Active Problems:   Type II diabetes mellitus with manifestations (HCC)   Persistent atrial fibrillation (HCC)   CAD (coronary artery disease)   Hypothyroid   Acute on chronic heart failure (HCC)   S/P thoracentesis   SOB (shortness of breath)    Time spent: 45 minutes   [facility] Pager [phone]. If 7PM-7AM, please contact night-coverage at [email or web address], password [facility] 11/06/2015, 11:17 AM  LOS: 2 days

## 2015-11-06 NOTE — Progress Notes (Signed)
ANTIBIOTIC CONSULT NOTE - INITIAL  Pharmacy Consult for levaquin Indication: CAP  Allergies  Allergen Reactions  . Lisinopril Cough  . Toujeo Solostar [Insulin Glargine] Other (See Comments)    Shaking, felt bad all over  . Clindamycin Diarrhea  . Other Other (See Comments)    toujeo with perioral tingling only - pt refuses to take further  . Oxycodone-Acetaminophen Diarrhea  . Penicillins Hives    Has patient had a PCN reaction causing immediate rash, facial/tongue/throat swelling, SOB or lightheadedness with hypotension: Yes Has patient had a PCN reaction causing severe rash involving mucus membranes or skin necrosis: no  Has patient had a PCN reaction that required hospitalization No Has patient had a PCN reaction occurring within the last 10 years: No If all of the above answers are "NO", then may proceed with Cephalosporin use.    Patient Measurements: Height: 6\' 2"  (188 cm) Weight: 175 lb 14.4 oz (79.788 kg) IBW/kg (Calculated) : 82.2  Vital Signs: Temp: 97.6 F (36.4 C) (12/09 0544) Temp Source: Oral (12/09 0544) BP: 90/57 mmHg (12/09 0544) Pulse Rate: 73 (12/09 0544) Intake/Output from previous day: 12/08 0701 - 12/09 0700 In: 960 [P.O.:960] Out: 500 [Urine:500] Intake/Output from this shift: Total I/O In: 240 [P.O.:240] Out: -   Labs:  Recent Labs  11/04/15 1100 11/04/15 1312 11/05/15 0812 11/06/15 0233  WBC 7.4 7.1  --  6.9  HGB 12.8* 11.8*  --  11.9*  PLT 167.0 151  --  144*  CREATININE 1.16 1.16 1.14 1.15   Estimated Creatinine Clearance: 50.1 mL/min (by C-G formula based on Cr of 1.15). No results for input(s): VANCOTROUGH, VANCOPEAK, VANCORANDOM, GENTTROUGH, GENTPEAK, GENTRANDOM, TOBRATROUGH, TOBRAPEAK, TOBRARND, AMIKACINPEAK, AMIKACINTROU, AMIKACIN in the last 72 hours.   Microbiology: Recent Results (from the past 720 hour(s))  Culture, body fluid-bottle     Status: None (Preliminary result)   Collection Time: 11/04/15  4:18 PM  Result  Value Ref Range Status   Specimen Description FLUID PLEURAL RIGHT  Final   Special Requests BOTTLES DRAWN AEROBIC AND ANAEROBIC 10CC  Final   Culture NO GROWTH < 24 HOURS  Final   Report Status PENDING  Incomplete  Gram stain     Status: None   Collection Time: 11/04/15  4:18 PM  Result Value Ref Range Status   Specimen Description FLUID PLEURAL RIGHT  Final   Special Requests NONE  Final   Gram Stain   Final    WBC PRESENT, PREDOMINANTLY MONONUCLEAR NO ORGANISMS SEEN CYTOSPIN    Report Status 11/04/2015 FINAL  Final  MRSA PCR Screening     Status: None   Collection Time: 11/05/15  8:19 PM  Result Value Ref Range Status   MRSA by PCR NEGATIVE NEGATIVE Final    Comment:        The GeneXpert MRSA Assay (FDA approved for NASAL specimens only), is one component of a comprehensive MRSA colonization surveillance program. It is not intended to diagnose MRSA infection nor to guide or monitor treatment for MRSA infections.     Medical History: Past Medical History  Diagnosis Date  . Acid reflux disease     history of  . H/O: GI bleed   . Paroxysmal atrial fibrillation (HCC)     chronic anticoag; tikosyn  . Benign prostatic hypertrophy     history of  . Hepatic damage march 2009    retained hepatic stone , intrahepatic duct catheter  . CAD (coronary artery disease)   . Hypertension   .  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 Seizures ([place])   . Ventricular tachycardia ? Tikosyn proarrhtyhmia   . Cataracts, bilateral   . Diabetes mellitus     insulin dep  . Pleural effusion 10/2015  . Shortness of breath dyspnea     Medications:  Prescriptions prior to admission  Medication Sig Dispense Refill Last Dose  . Ascorbic Acid (VITAMIN C) 500 MG tablet Take 500 mg by mouth daily.    11/03/2015 at Unknown time  . carvedilol (COREG)  6.25 MG tablet Take 1 tablet (6.25 mg total) by mouth 2 (two) times daily. 180 tablet 3 11/04/2015 at 0730  . [facility] GLUCOSAMINE-CHONDROITIN PO Take 1 tablet by mouth daily. 1200/600 mg   11/04/2015 at Unknown time  . ELIQUIS 2.5 MG TABS tablet Take 1 tablet by mouth two  times daily 180 tablet 1 11/04/2015 at Unknown time  . Emollient (AMLACTIN XL) LOTN Apply 1 application topically daily. Apply lotion to legs   11/04/2015 at Unknown time  . finasteride (PROSCAR) 5 MG tablet Take 1 tablet (5 mg total) by mouth daily. 90 tablet 3 11/04/2015 at Unknown time  . furosemide (LASIX) 20 MG tablet Take 1 tablet (20 mg total) by mouth every other day. 45 tablet 3 11/03/2015 at Unknown time  . insulin aspart (NOVOLOG FLEXPEN) 100 UNIT/ML FlexPen Inject 4-8 Units into the skin 3 (three) times daily with meals. Inject 5 units subcutaneously with breakfast, 3 units with lunch and 5 units with supper   11/04/2015 at Unknown time  . Insulin Detemir (LEVEMIR FLEXTOUCH) 100 UNIT/ML Pen Inject 5 Units into the skin at bedtime.    11/03/2015 at Unknown time  . lipase/protease/amylase (CREON) 12000 UNITS CPEP capsule Take 1 capsule (12,000 Units total) by mouth 3 (three) times daily before meals. 270 capsule 3 11/04/2015 at Unknown time  . Multiple Vitamins-Minerals (CENTRUM SILVER PO) Take 1 tablet by mouth daily.    11/04/2015 at Unknown time  . Probiotic Product (PROBIOTIC DAILY PO) Take 3 tablets by mouth daily. 3 tabs once a day with meals.   Past Week at Unknown time  . SALINE NA Place 1 spray into both nostrils 2 (two) times daily.    11/04/2015 at Unknown time  . tamsulosin (FLOMAX) 0.4 MG CAPS capsule Take 1 capsule (0.4 mg total) by mouth 2 (two) times daily. 180 capsule 3 11/04/2015 at Unknown time  . azelastine (ASTELIN) 0.1 % nasal spray Place 2 sprays into both nostrils 2 (two) times daily. Use in each nostril as directed 90 mL 3 11/02/2015  . Insulin Pen Needle (B-D UF III MINI PEN NEEDLES) 31G X 5 MM MISC Inject 120  Devices into the skin 4 (four) times daily. Use a new needle for injecting insulin4 times a day 360 each 3 Taking   Scheduled:  . acidophilus  1 capsule Oral Daily  . apixaban  2.5 mg Oral BID  . azelastine  2 spray Each Nare BID  . carvedilol  6.25 mg Oral BID  . cetaphil   Topical Daily  . finasteride  5 mg Oral Daily  . furosemide  40 mg Intravenous Once  . insulin aspart  0-9 Units Subcutaneous TID WC  . insulin aspart  3 Units Subcutaneous TID WC  . lipase/protease/amylase  12,000 Units Oral TID AC  . multivitamin  with minerals  1 tablet Oral Daily  . sodium chloride  1 spray Each Nare BID  . sodium chloride  3 mL Intravenous Q12H  . tamsulosin  0.4 mg Oral BID    Assessment: 81 yo male with SOB and pleural effusion with possible HF vs CAP to begin levaquin.  WBC= 6.9, afebrile, SCr= 1.15 and CrCl ~ 50  12/9 LQ>>  12/7 blood x2- ngtd  Plan:  -Levaquin 500mg  IV q24hr -Will consider change to po in 24-48hrs  Hildred Laser, Pharm D 11/06/2015 9:47 AM

## 2015-11-06 NOTE — Progress Notes (Signed)
UR Completed. Blayton Huttner, RN, BSN.  [phone] 

## 2015-11-06 NOTE — Clinical Documentation Improvement (Signed)
Hospitalist  Can the diagnosis of CHF be further specified?    Type - Systolic, Diastolic, Systolic and Diastolic  Other  Clinically Undetermined   Document any associated diagnoses/conditions   Supporting Information: Echo results now available.  Need to know type associated with acute on chronic CHF.   Please exercise your independent, professional judgment when responding. A specific answer is not anticipated or expected.   Thank You,  [place] [facility] [phone]

## 2015-11-06 NOTE — Progress Notes (Signed)
Speech Pathology Procedure Note  Patient Details Name: [name] MRN: [id number] DOB: [birth year]   MBS completed and report available in imaging section; please see for results/recommendations        Rakel Junio,PAT, M.S., CCC-SLP 11/06/2015, 10:43 AM

## 2015-11-06 NOTE — Procedures (Signed)
Successful US guided right thoracentesis. Yielded 1.6 liters of clear yellow fluid. Pt tolerated procedure well. No immediate complications.  Specimen was not sent for labs. CXR ordered.  Judie Grieve BLAIR PA-C 11/06/2015 4:36 PM

## 2015-11-07 ENCOUNTER — Inpatient Hospital Stay (HOSPITAL_COMMUNITY): Payer: Medicare Other

## 2015-11-07 DIAGNOSIS — N289 Disorder of kidney and ureter, unspecified: Secondary | ICD-10-CM

## 2015-11-07 DIAGNOSIS — I5021 Acute systolic (congestive) heart failure: Secondary | ICD-10-CM

## 2015-11-07 DIAGNOSIS — R609 Edema, unspecified: Secondary | ICD-10-CM

## 2015-11-07 LAB — GLUCOSE, CAPILLARY
GLUCOSE-CAPILLARY: 153 mg/dL — AB (ref 65–99)
Glucose-Capillary: 131 mg/dL — ABNORMAL HIGH (ref 65–99)
Glucose-Capillary: 119 mg/dL — ABNORMAL HIGH (ref 65–99)
Glucose-Capillary: 79 mg/dL (ref 65–99)

## 2015-11-07 LAB — COMPREHENSIVE METABOLIC PANEL
ALK PHOS: 122 U/L (ref 38–126)
ALT: 19 U/L (ref 17–63)
AST: 27 U/L (ref 15–41)
Albumin: 2.7 g/dL — ABNORMAL LOW (ref 3.5–5.0)
Anion gap: 8 (ref 5–15)
BILIRUBIN TOTAL: 1.1 mg/dL (ref 0.3–1.2)
BUN: 19 mg/dL (ref 6–20)
CALCIUM: 8.4 mg/dL — AB (ref 8.9–10.3)
CO2: 28 mmol/L (ref 22–32)
CREATININE: 1.41 mg/dL — AB (ref 0.61–1.24)
Chloride: 99 mmol/L — ABNORMAL LOW (ref 101–111)
GFR calc Af Amer: 50 mL/min — ABNORMAL LOW (ref >60)
GFR, EST NON AFRICAN AMERICAN: 43 mL/min — AB (ref >60)
Glucose, Bld: 142 mg/dL — ABNORMAL HIGH (ref 65–99)
POTASSIUM: 3.9 mmol/L (ref 3.5–5.1)
Sodium: 135 mmol/L (ref 135–145)
TOTAL PROTEIN: 5.7 g/dL — AB (ref 6.5–8.1)

## 2015-11-07 MED ORDER — LEVOFLOXACIN IN D5W 750 MG/150ML IV SOLN: 750.0000 mg | INTRAVENOUS | Status: DC

## 2015-11-07 MED ORDER — DIGOXIN 125 MCG PO TABS
0.1250 mg | ORAL_TABLET | Freq: Every day | ORAL | Status: DC
  Administered 2015-11-07 – 2015-11-12 (×5): 0.125 mg via ORAL
  Filled 2015-11-07 (×5): qty 1

## 2015-11-07 MED ORDER — DIGOXIN 0.25 MG/ML IJ SOLN
0.5000 mg | Freq: Every day | INTRAMUSCULAR | Status: AC
  Administered 2015-11-07: 0.5 mg via INTRAVENOUS
  Filled 2015-11-07: qty 2

## 2015-11-07 MED ORDER — FUROSEMIDE 10 MG/ML IJ SOLN
40.0000 mg | Freq: Every day | INTRAMUSCULAR | Status: DC
Start: 2015-11-08 — End: 2015-11-08
  Filled 2015-11-07: qty 4

## 2015-11-07 MED ORDER — MAGNESIUM HYDROXIDE 400 MG/5ML PO SUSP: 30.0000 mL | Freq: Every day | ORAL | Status: DC | PRN

## 2015-11-07 NOTE — Progress Notes (Signed)
VASCULAR LAB PRELIMINARY  PRELIMINARY  PRELIMINARY  PRELIMINARY  Bilateral lower extremity venous duplex  completed.    Preliminary report:  Bilateral:  No evidence of DVT, superficial thrombosis, or [name]'s Cyst.    Elizabet Schweppe, RVT 11/07/2015, 9:55 AM

## 2015-11-07 NOTE — Progress Notes (Signed)
ANTIBIOTIC CONSULT NOTE  Pharmacy Consult for levaquin Indication: CAP  Allergies  Allergen Reactions  . Lisinopril Cough  . Toujeo Solostar [Insulin Glargine] Other (See Comments)    Shaking, felt bad all over  . Clindamycin Diarrhea  . Other Other (See Comments)    toujeo with perioral tingling only - pt refuses to take further  . Oxycodone-Acetaminophen Diarrhea  . Penicillins Hives    Has patient had a PCN reaction causing immediate rash, facial/tongue/throat swelling, SOB or lightheadedness with hypotension: Yes Has patient had a PCN reaction causing severe rash involving mucus membranes or skin necrosis: no  Has patient had a PCN reaction that required hospitalization No Has patient had a PCN reaction occurring within the last 10 years: No If all of the above answers are "NO", then may proceed with Cephalosporin use.    Patient Measurements: Height: 6\' 2"  (188 cm) Weight: 166 lb 1.6 oz (75.342 kg) IBW/kg (Calculated) : 82.2  Vital Signs: Temp: 97.8 F (36.6 C) (12/10 0406) Temp Source: Oral (12/10 0406) BP: 90/48 mmHg (12/10 0406) Pulse Rate: 116 (12/10 0406) Intake/Output from previous day: 12/09 0701 - 12/10 0700 In: 480 [P.O.:480] Out: 2130 [Urine:2130] Intake/Output from this shift:    Labs:  Recent Labs  11/05/15 0812 11/06/15 0233 11/07/15 0228  WBC  --  6.9  --   HGB  --  11.9*  --   PLT  --  144*  --   CREATININE 1.14 1.15 1.41*   Estimated Creatinine Clearance: 38.6 mL/min (by C-G formula based on Cr of 1.41). No results for input(s): VANCOTROUGH, VANCOPEAK, VANCORANDOM, GENTTROUGH, GENTPEAK, GENTRANDOM, TOBRATROUGH, TOBRAPEAK, TOBRARND, AMIKACINPEAK, AMIKACINTROU, AMIKACIN in the last 72 hours.   Microbiology: Recent Results (from the past 720 hour(s))  Culture, body fluid-bottle     Status: None (Preliminary result)   Collection Time: 11/04/15  4:18 PM  Result Value Ref Range Status   Specimen Description FLUID PLEURAL RIGHT  Final   Special Requests BOTTLES DRAWN AEROBIC AND ANAEROBIC 10CC  Final   Culture NO GROWTH 3 DAYS  Final   Report Status PENDING  Incomplete  Gram stain     Status: None   Collection Time: 11/04/15  4:18 PM  Result Value Ref Range Status   Specimen Description FLUID PLEURAL RIGHT  Final   Special Requests NONE  Final   Gram Stain   Final    WBC PRESENT, PREDOMINANTLY MONONUCLEAR NO ORGANISMS SEEN CYTOSPIN    Report Status 11/04/2015 FINAL  Final  MRSA PCR Screening     Status: None   Collection Time: 11/05/15  8:19 PM  Result Value Ref Range Status   MRSA by PCR NEGATIVE NEGATIVE Final    Comment:        The GeneXpert MRSA Assay (FDA approved for NASAL specimens only), is one component of a comprehensive MRSA colonization surveillance program. It is not intended to diagnose MRSA infection nor to guide or monitor treatment for MRSA infections.     Medical History: Past Medical History  Diagnosis Date  . Acid reflux disease     history of  . H/O: GI bleed   . Paroxysmal atrial fibrillation (HCC)     chronic anticoag; tikosyn  . Benign prostatic hypertrophy     history of  . Hepatic damage march 2009    retained hepatic stone , intrahepatic duct catheter  . CAD (coronary artery disease)   . Hypertension   . 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 Seizures ([place])   . Ventricular tachycardia ? Tikosyn proarrhtyhmia   . Cataracts, bilateral   . Diabetes mellitus     insulin dep  . Pleural effusion 10/2015  . Shortness of breath dyspnea     Medications:  Prescriptions prior to admission  Medication Sig Dispense Refill Last Dose  . Ascorbic Acid (VITAMIN C) 500 MG tablet Take 500 mg by mouth daily.    11/03/2015 at Unknown time  . carvedilol (COREG) 6.25 MG tablet Take 1 tablet (6.25 mg total) by mouth 2 (two) times daily. 180  tablet 3 11/04/2015 at 0730  . [facility] GLUCOSAMINE-CHONDROITIN PO Take 1 tablet by mouth daily. 1200/600 mg   11/04/2015 at Unknown time  . ELIQUIS 2.5 MG TABS tablet Take 1 tablet by mouth two  times daily 180 tablet 1 11/04/2015 at Unknown time  . Emollient (AMLACTIN XL) LOTN Apply 1 application topically daily. Apply lotion to legs   11/04/2015 at Unknown time  . finasteride (PROSCAR) 5 MG tablet Take 1 tablet (5 mg total) by mouth daily. 90 tablet 3 11/04/2015 at Unknown time  . furosemide (LASIX) 20 MG tablet Take 1 tablet (20 mg total) by mouth every other day. 45 tablet 3 11/03/2015 at Unknown time  . insulin aspart (NOVOLOG FLEXPEN) 100 UNIT/ML FlexPen Inject 4-8 Units into the skin 3 (three) times daily with meals. Inject 5 units subcutaneously with breakfast, 3 units with lunch and 5 units with supper   11/04/2015 at Unknown time  . Insulin Detemir (LEVEMIR FLEXTOUCH) 100 UNIT/ML Pen Inject 5 Units into the skin at bedtime.    11/03/2015 at Unknown time  . lipase/protease/amylase (CREON) 12000 UNITS CPEP capsule Take 1 capsule (12,000 Units total) by mouth 3 (three) times daily before meals. 270 capsule 3 11/04/2015 at Unknown time  . Multiple Vitamins-Minerals (CENTRUM SILVER PO) Take 1 tablet by mouth daily.    11/04/2015 at Unknown time  . Probiotic Product (PROBIOTIC DAILY PO) Take 3 tablets by mouth daily. 3 tabs once a day with meals.   Past Week at Unknown time  . SALINE NA Place 1 spray into both nostrils 2 (two) times daily.    11/04/2015 at Unknown time  . tamsulosin (FLOMAX) 0.4 MG CAPS capsule Take 1 capsule (0.4 mg total) by mouth 2 (two) times daily. 180 capsule 3 11/04/2015 at Unknown time  . azelastine (ASTELIN) 0.1 % nasal spray Place 2 sprays into both nostrils 2 (two) times daily. Use in each nostril as directed 90 mL 3 11/02/2015  . Insulin Pen Needle (B-D UF III MINI PEN NEEDLES) 31G X 5 MM MISC Inject 120 Devices into the skin 4 (four) times daily. Use a new needle for injecting insulin4  times a day 360 each 3 Taking   Scheduled:  . acidophilus  1 capsule Oral Daily  . azelastine  2 spray Each Nare BID  . carvedilol  3.125 mg Oral BID WC  . cetaphil   Topical Daily  . digoxin  0.125 mg Oral Daily  . finasteride  5 mg Oral Daily  . [START ON 11/08/2015] furosemide  40 mg Intravenous Daily  . insulin aspart  0-9 Units Subcutaneous TID WC  . insulin aspart  3 Units Subcutaneous TID WC  . [START ON 11/09/2015] levofloxacin (LEVAQUIN) IV  750 mg Intravenous Q48H  . lipase/protease/amylase  12,000  Units Oral TID AC  . multivitamin with minerals  1 tablet Oral Daily  . sodium chloride  1 spray Each Nare BID  . sodium chloride  3 mL Intravenous Q12H  . tamsulosin  0.4 mg Oral BID    Assessment: 81 yo male with SOB and pleural effusion with possible HF vs CAP continuing on levaquin. Afeb, wbc wnl, SCr 1.15>>1.41, CrCl~38.  12/9 LQ>>  12/7 blood x2- ngtd  Plan:  -Levaquin to 750mg  IV q48h -Monitor clinical progress, c/s, renal function, abx plan/LOT -consider change to po in 24-48hrs  Elicia Lamp, PharmD, [facility] Clinical Pharmacist Pager [phone] 11/07/2015 1:56 PM

## 2015-11-07 NOTE — Progress Notes (Signed)
Primary cardiologist: Dr. Virl Axe  Seen for followup: AF, cardiomyopathy  Subjective:    Up in room. No chest pain, reported improvement in breathing status after having thoracentesis. No dizziness or syncope. Good appetite.  Objective:   Temp:  [97.7 F (36.5 C)-97.9 F (36.6 C)] 97.8 F (36.6 C) (12/10 0406) Pulse Rate:  [67-116] 116 (12/10 0406) Resp:  [16-18] 18 (12/10 0406) BP: (90-112)/(48-71) 90/48 mmHg (12/10 0406) SpO2:  [94 %-95 %] 95 % (12/10 0406) Weight:  [166 lb 1.6 oz (75.342 kg)] 166 lb 1.6 oz (75.342 kg) (12/10 0406) Last BM Date: 11/05/15  Filed Weights   11/05/15 0403 11/06/15 0544 11/07/15 0406  Weight: 175 lb 8 oz (79.606 kg) 175 lb 14.4 oz (79.788 kg) 166 lb 1.6 oz (75.342 kg)    Intake/Output Summary (Last 24 hours) at 11/07/15 0910 Last data filed at 11/07/15 0407  Gross per 24 hour  Intake    240 ml  Output   2130 ml  Net  -1890 ml    Telemetry: Atrial fibrillation.  Exam:  General: Elderly male, appears comfortable at rest.  HEENT: Conjunctiva and lids normal, all Cecille Rubin clear.  Lungs: Decreased breath sounds at the right base. No wheezing or labored breathing.  Cardiac: Irregularly irregular with soft apical systolic murmur. No elevated JVP.  Abdomen: NABS.  Extremities: No pitting edema.  Lab Results:  Basic Metabolic Panel:  Recent Labs Lab 11/04/15 1312 11/05/15 0812 11/06/15 0233 11/07/15 0228  NA 135 135 134* 135  K 4.5 4.2 3.7 3.9  CL 104 103 103 99*  CO2 26 24 27 28   GLUCOSE 115* 153* 103* 142*  BUN 18 19 17 19   CREATININE 1.16 1.14 1.15 1.41*  CALCIUM 8.7* 8.3* 8.3* 8.4*  MG 2.0  --   --   --     Liver Function Tests:  Recent Labs Lab 11/04/15 1312 11/06/15 0233 11/07/15 0228  AST 28 25 27   ALT 18 17 19   ALKPHOS 123 115 122  BILITOT 0.8 0.6 1.1  PROT 6.0* 5.6* 5.7*  ALBUMIN 3.0* 2.7* 2.7*    CBC:  Recent Labs Lab 11/04/15 1100 11/04/15 1312 11/06/15 0233  WBC 7.4 7.1 6.9  HGB  12.8* 11.8* 11.9*  HCT 39.5 35.6* 35.3*  MCV 96.9 95.7 95.9  PLT 167.0 151 144*    Cardiac Enzymes:  Recent Labs Lab 11/04/15 1300  CKTOTAL 42  CKMB 2.5   Echocardiogram 11/05/2015: Study Conclusions  - Left ventricle: The cavity size was normal. Systolic function was severely reduced. The estimated ejection fraction was in the range of 25% to 30%. Diffuse hypokinesis. - Aortic valve: Trileaflet; mildly thickened, mildly calcified leaflets. Cusp separation was reduced. - Aorta: The aorta was moderately calcified. - Mitral valve: Moderately calcified annulus. There was mild regurgitation. - Left atrium: The atrium was moderately dilated. - Right ventricle: The cavity size was moderately dilated. Wall thickness was normal. Systolic function was moderately reduced. - Right atrium: The atrium was severely dilated. - Tricuspid valve: There was moderate regurgitation. - Pulmonic valve: There was moderate regurgitation.  Impressions:  - EF is reduced when compared to prior study (50%).   Medications:   Scheduled Medications: . acidophilus  1 capsule Oral Daily  . azelastine  2 spray Each Nare BID  . carvedilol  3.125 mg Oral BID WC  . cetaphil   Topical Daily  . finasteride  5 mg Oral Daily  . furosemide  40 mg Intravenous BID  . insulin  aspart  0-9 Units Subcutaneous TID WC  . insulin aspart  3 Units Subcutaneous TID WC  . levofloxacin (LEVAQUIN) IV  500 mg Intravenous Q24H  . lipase/protease/amylase  12,000 Units Oral TID AC  . multivitamin with minerals  1 tablet Oral Daily  . sodium chloride  1 spray Each Nare BID  . sodium chloride  3 mL Intravenous Q12H  . tamsulosin  0.4 mg Oral BID     PRN Medications: sodium chloride, acetaminophen, diltiazem, magnesium hydroxide, ondansetron (ZOFRAN) IV, sodium chloride   Assessment:   1. Persistent atrial fibrillation, failed Tikosyn and amiodarone. Recent visit with Dr. Caryl Comes was in October at which time  strategy of heart rate control and anticoagulation was pursued. Currently Eliquis has been held in anticipation of diagnostic cardiac catheterization on Monday as per Dr. Debara Pickett.  2. Acute systolic heart failure, LVEF 25-30%, decreased from 50% in 2013. Status post thoracentesis of moderate right sided pleural effusion, and is diuresing on IV Lasix.  3. Coronary artery calcifications noted by chest CT imaging. No prior history of obstructive CAD or myocardial infarction. Troponin I level 0.02 during this hospital stay.  4. History of hypothyroidism, recent TSH 6.8.  5. Essential hypertension, blood pressures have been low and limiting medical therapy to some degree.  6. Acute renal insufficiency, creatinine has bumped up to 1.4.   Plan/Discussion:    Patient scheduled for left and right heart catheterization for early next week as per Dr. Debara Pickett. He has been taken off Eliquis for now. Plan to cut Lasix back to 40 mg once daily with bump in creatinine. Low blood pressure limits further up titration of Coreg, will add low dose Lanoxin to assist with rate control. Hold off on adding ACE inhibitor or ARB as well with low blood pressure and allergy to lisinopril. BMET in AM.   Satira Sark, M.D., F.A.C.C.

## 2015-11-07 NOTE — Progress Notes (Addendum)
[facility] PROGRESS NOTE  [name] [id number] DOB: 28-Aug-1925 DOA: 11/04/2015 PCP: [name], MD  Length of stay: 3   Assessment/Plan: Principal Problem:   Pleural effusion Active Problems:   Type II diabetes mellitus with manifestations ([place])   Persistent atrial fibrillation (HCC)   CAD (coronary artery disease)   Hypothyroid   Acute on chronic heart failure (HCC)   S/P thoracentesis   SOB (shortness of breath)   CAP (community acquired pneumonia)    Brief summary 81 y.o. male, with permanent atrial fibrillation on Eliquis, diabetes mellitus, seizures, and history of Whipple procedure who presents to the emergency department with shortness of breath. [name] saw his primary care physician this morning for cough and shortness of breath and was sent to the emergency department after an x-ray showed a large right pleural effusion. [name] reports that he has been feeling fatigued and badly for about the past month. But over the last week he has become much worse. He is no longer able to walk his daily 1/2 mile. He is unable to sleep. He has a dry cough but occasionally brings up frothy sputum. He also coughs when he eats or drinks liquids, +orthopnea, +PND, +insomnia. The patient reports he was taken off of amiodarone approximately 3 mos ago, and was told by his cardiologist, Dr. Caryl Comes, that his afib was going to be permanent.   Assessment and plan Acute systolic heart failure, LVEF 25-30%, decreased from 50% in 2013.  Right-sided pleural effusion  Appears to be transudative in nature, await cytology results ,Continue IV Lasix in an attempt to gently/carefully diurese. Unfortunately his blood pressure is already somewhat soft.  S/p  thoracentesis was performed. They removed 1.4 liters fluid on 12/7, repeat thoracentesis on 12/9, 1.6 L of yellow fluid removed CT chest shows a moderate-sized right pleural effusion, questionable right lower lobe pneumonia,  started on Levaquin Chronic fragment from a catheter measuring 12.5 cm which was present on prior CT Speech therapy consultation to rule out aspiration, started on regular diet and thin liquid Empirically started on levofloxacin 5 days, although I doubt that the patient has a pneumonia clinically no fever no cough no white count Venous doppler No evidence of DVT, superficial thrombosis, or [name]'s Cyst   Acute on chronic systolic heart failure Patient appears in mild volume overload. + JVD, +1 bilateral lower extremity edema. Repeat  2-D echocardiogram shows EF of 25-30%. , Previously 50%, cardiology consulted  Continue Coreg. No ACE inhibitor as blood pressure soft.  Continue Lasix, reduced dose given increasing creatinine Low blood pressure limits further up titration of Coreg, will add low dose Lanoxin to assist with rate control. Hold off on adding ACE inhibitor or ARB as well with low blood pressure and allergy to lisinopril. BMET in AM  Coronary artery calcifications noted by chest CT imaging. No prior history of obstructive CAD or myocardial infarction. Troponin I level 0.02 during this hospital stay. Cardiac cath Monday .taken off Eliquis for now  Persistent atrial fibrillation failed Tikosyn and amiodarone. Recent visit with Dr. Caryl Comes was in October at which time strategy of heart rate control and anticoagulation was pursued. Currently Eliquis has been held in anticipation of diagnostic cardiac catheterization on Monday as per Dr. Debara Pickett  Type 2 diabetes Dc Levemir and continue NovoLog.  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 Continue Creon supplementation.  Hypothyroidism Not currently on thyroid hormone supplementation?  TSH 6.85.  Free T4 within normal limits    DVT prophylaxsis eliquis   Code Status:      Code Status Orders        Start     Ordered   11/04/15 1629  Full code   Continuous     11/04/15 1628    Advance  Directive Documentation        Most Recent Value   Type of Advance Directive  Healthcare Power of Attorney   Pre-existing out of facility DNR order (yellow form or pink MOST form)     "MOST" Form in Place?        Family Communication: Discussed in detail with the patient, all imaging results, lab results explained to the patient   Disposition Plan: Anticipate discharge  Monday after cath   Consultants:  None  Procedures:  Thoracentesis  Antibiotics: Anti-infectives    Start     Dose/Rate Route Frequency Ordered Stop   11/06/15 1100  levofloxacin (LEVAQUIN) IVPB 500 mg     500 mg 100 mL/hr over 60 Minutes Intravenous Every 24 hours 11/06/15 0949           HPI/Subjective: No chest pain, reported improvement in breathing status after having thoracentesis. No dizziness or syncope. Good appetite.   Objective: Filed Vitals:   11/06/15 1345 11/06/15 1624 11/06/15 1925 11/07/15 0406  BP: 96/60 99/55 95/59  90/48  Pulse: 105  96 116  Temp: 97.7 F (36.5 C)  97.7 F (36.5 C) 97.8 F (36.6 C)  TempSrc: Oral  Oral Oral  Resp: 18  16 18   Height:      Weight:    75.342 kg (166 lb 1.6 oz)  SpO2: 95%  95% 95%    Intake/Output Summary (Last 24 hours) at 11/07/15 1058 Last data filed at 11/07/15 0407  Gross per 24 hour  Intake    240 ml  Output   2130 ml  Net  -1890 ml    Exam:  General: No acute respiratory distress Lungs: Clear to auscultation bilaterally without wheezes or crackles Cardiovascular: Regular rate and rhythm without murmur gallop or rub normal S1 and S2 Abdomen: Nontender, nondistended, soft, bowel sounds positive, no rebound, no ascites, no appreciable mass Extremities: No significant cyanosis, clubbing, or edema bilateral lower extremities     Data Review   Micro Results Recent Results (from the past 240 hour(s))  Culture, body fluid-bottle     Status: None (Preliminary result)   Collection Time: 11/04/15  4:18 PM  Result Value Ref Range  Status   Specimen Description FLUID PLEURAL RIGHT  Final   Special Requests BOTTLES DRAWN AEROBIC AND ANAEROBIC 10CC  Final   Culture NO GROWTH 2 DAYS  Final   Report Status PENDING  Incomplete  Gram stain     Status: None   Collection Time: 11/04/15  4:18 PM  Result Value Ref Range Status   Specimen Description FLUID PLEURAL RIGHT  Final   Special Requests NONE  Final   Gram Stain   Final    WBC PRESENT, PREDOMINANTLY MONONUCLEAR NO ORGANISMS SEEN CYTOSPIN    Report Status 11/04/2015 FINAL  Final  MRSA PCR Screening     Status: None   Collection Time: 11/05/15  8:19 PM  Result Value Ref Range Status   MRSA by PCR NEGATIVE NEGATIVE Final    Comment:        The GeneXpert MRSA Assay (FDA approved for NASAL specimens only), is one component of a comprehensive MRSA colonization surveillance program.  It is not intended to diagnose MRSA infection nor to guide or monitor treatment for MRSA infections.     Radiology Reports Dg Chest 1 View  11/06/2015  CLINICAL DATA:  Post right thoracentesis EXAM: CHEST 1 VIEW COMPARISON:  11/05/2015 FINDINGS: Cardiomediastinal silhouette is stable. The right pleural effusion has resolved. Mild bilateral basilar atelectasis left greater than right. Right hilar catheter is stable in position. No segmental infiltrate or pulmonary edema. No pneumothorax. IMPRESSION: The right pleural effusion has resolved. Mild bilateral basilar atelectasis left greater than right. Right hilar catheter is stable in position. No segmental infiltrate or pulmonary edema. No pneumothorax. Electronically Signed   By: Lahoma Crocker M.D.   On: 11/06/2015 17:13   Dg Chest 1 View  11/04/2015  CLINICAL DATA:  Initial encounter for status post right thoracentesis. EXAM: CHEST 1 VIEW COMPARISON:  11/04/2015, earlier the same day. FINDINGS: Interval decrease in right pleural effusion no evidence for pneumothorax. Stable appearance of catheter tubing over the medial right hemi thorax and  heart silhouette. Left lung remains clear. The cardio pericardial silhouette is enlarged. Telemetry leads overlie the chest. IMPRESSION: No evidence for pneumothorax status post thoracentesis. Electronically Signed   By: Misty Stanley M.D.   On: 11/04/2015 16:56   Dg Chest 2 View  11/05/2015  CLINICAL DATA:  History of recent thoracentesis forl reduction of right pleural effusion, cough and congestion EXAM: CHEST  2 VIEW COMPARISON:  Portable chest x-ray of 11/04/2015 FINDINGS: After right thoracentesis, there is little change in the small right pleural effusion remaining with right basilar atelectasis. The left lung is clear. Cardiomegaly is stable. No pneumothorax is seen. IMPRESSION: No significant change after right thoracentesis in the volume of the right pleural effusion with right basilar atelectasis. Electronically Signed   By: Ivar Drape M.D.   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 EXAM: CHEST  2 VIEW COMPARISON:  PA and lateral chest x-ray of June 22, 2013 FINDINGS: There is a new moderate-sized right pleural effusion occupying 1/2 of the right hemithorax. Right basilar atelectasis or pneumonia may be obscured. There is small amount of atelectasis or infiltrate at the left lung base medially. A trace of pleural fluid on the left is suspected. The cardiac silhouette is mildly enlarged. The pulmonary vascularity is normal. The bony thorax exhibits no acute abnormality. IMPRESSION: Since the previous study the patient has developed a moderate sized right pleural effusion. This may be obscuring right basilar atelectasis, pneumonia, or mass. There is a trace left pleural effusion and minimal left basilar atelectasis or infiltrate. There is no evidence of CHF. Chest CT scanning is recommended. Electronically Signed   By: David   Martinique M.D.   On: 11/04/2015 11:30   Ct Angio Chest Pe W/cm &/or Wo Cm  11/05/2015  CLINICAL DATA:  Shortness of breath, pleural effusion. 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 Multiplanar CT image reconstructions and MIPs were obtained to evaluate the vascular anatomy. CONTRAST:  28mL OMNIPAQUE IOHEXOL 350 MG/ML SOLN COMPARISON:  CT abdomen 12/16/ 15 images of the lung bases FINDINGS: Sagittal images of the spine shows osteopenia and degenerative changes thoracic spine. Sagittal view of the sternum is unremarkable. Atherosclerotic calcifications  of thoracic aorta and coronary arteries. The study is of excellent technical quality. No pulmonary embolus is noted. Bilateral perihilar peribronchial thickening is noted. There is no mediastinal hematoma or adenopathy. Images of the upper abdomen shows partially visualized postsurgical changes post Whipple procedure. There is moderate size right pleural effusion. Small left pleural effusion. There is atelectasis in left lower lobe posteriorly. There is patchy airspace disease/infiltrate in right middle lobe and right lower lobe. Findings are highly suspicious for pneumonia. There is a catheter fragment in main pulmonary artery extending from just above the origin in right main pulmonary artery and a small branch. The catheter measures at least 12.5 cm in length. This was only partially visualized on prior exam stable in position from prior exam. Review of the MIP images confirms the above findings. IMPRESSION: 1. No pulmonary embolus. 2. There is moderate size right pleural effusion. Small left pleural effusion. Atelectasis noted in left lower lobe posteriorly. There is consolidation with air bronchogram in right lower lobe and right middle lobe highly suspicious for  infiltrate/pneumonia. 3. Mild perihilar bronchitic changes. 4. No mediastinal hematoma or adenopathy. 5. There is a catheter fragment in main pulmonary artery extending from just above the origin in right main pulmonary artery and a small branch. The catheter measures at least 12.5 cm in length. This was only partially visualized on prior exam stable in position from prior exam. These results were called by telephone at the time of interpretation on 11/05/2015 at 1:57 pm to Dr. Reyne Dumas , who verbally acknowledged these results. Electronically Signed   By: Lahoma Crocker M.D.   On: 11/05/2015 14:02   Dg Swallowing Func-speech Pathology  11/06/2015  Objective Swallowing Evaluation:   Patient Details Name: [name] MRN: [id number] Date of Birth: 1925/10/23 Today's Date: 11/06/2015 Time: SLP Start Time (ACUTE ONLY): 1009-SLP Stop Time (ACUTE ONLY): 1020 SLP Time Calculation (min) (ACUTE ONLY): 11 min Past Medical History: Past Medical History Diagnosis Date . Acid reflux disease    history of . H/O: GI bleed  . Paroxysmal atrial fibrillation (HCC)    chronic anticoag; tikosyn . Benign prostatic hypertrophy    history of . Hepatic damage march 2009   retained hepatic stone , intrahepatic duct catheter . CAD (coronary artery disease)  . Hypertension  . 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 Seizures ([place])  . Ventricular tachycardia ? Tikosyn proarrhtyhmia  . Cataracts, bilateral  . Diabetes mellitus    insulin dep . Pleural effusion 10/2015 . Shortness of breath dyspnea  Past Surgical History: Past Surgical History Procedure Laterality Date . Cardioversion   . Cholecystectomy     history of . Partial gastrectomy     history of . Shoulder surgery     left shoulder repair with 2 pens inserted . Whipple procedure     operation . 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 Inguinal  hernia repair     inguinal herniorrhaphy, right... history of . Appendectomy     history of . Cardioversion  03/07/2012   Procedure: CARDIOVERSION;  Surgeon: Deboraha Sprang, MD;  Location: [place];  Service: Cardiovascular;  Laterality: N/A; . Cardioversion N/A 08/10/2015   Procedure: CARDIOVERSION;  Surgeon: Thayer Headings, MD;  Location: [facility] ENDOSCOPY;  Service: Cardiovascular;  Laterality: N/A; HPI: Lyell Totino is a 81 y.o. male, with permanent  atrial fibrillation on Eliquis, diabetes mellitus, seizures, and history of Whipple procedure who presents to the emergency department with shortness of breath. [name] saw his primary care physician for cough and shortness of breath and was sent to the emergency department after an x-ray showed a large right pleural effusion. [name] reports that he has been feeling fatigued and badly for about the past month. But over the last week he has become much worse. He is no longer able to walk his daily 1/2 mile. He is unable to sleep. He has a dry cough but occasionally brings up frothy sputum. He also coughs when he eats or drinks liquids,  No Data Recorded Assessment / Plan / Recommendation CHL IP CLINICAL IMPRESSIONS 11/06/2015 Therapy Diagnosis Moderate pharyngeal phase dysphagia Clinical Impression Pt presents with moderate pharyngeal phase dysphagia characterized by one incidence of flash penetration with larger boluses of liquids, but clearance noted without strategies utilized; pt also exhibited moderate vallecular residue with puree-solid consistencies which required multiple repetitive swallows, effortful swallows and alternating solids/liquids to clear valleculae which place him at a moderate aspiration risk without use of strategies during po intake; decreased epiglottic inversion noted intermittently during study as well which also increases his aspiration risk.  Pt did not aspirate any consistency, but is certainly at risk if strategies are not utilized during  po intake; ST to f/u during hospitalization for diet tolerance/pt education re: aspiration risk/swallowing strategy use and should be f/u with SLP at SNF. Impact on safety and function Moderate aspiration risk   CHL IP TREATMENT RECOMMENDATION 11/06/2015 Treatment Recommendations Therapy as outlined in treatment plan below   Prognosis 11/06/2015 Prognosis for Safe Diet Advancement Good Barriers to Reach Goals -- Barriers/Prognosis Comment -- CHL IP DIET RECOMMENDATION 11/06/2015 SLP Diet Recommendations Regular solids;Thin liquid Liquid Administration via Cup Medication Administration Whole meds with liquid Compensations Slow rate;Small sips/bites;Clear throat intermittently;Effortful swallow Postural Changes Seated upright at 90 degrees   CHL IP OTHER RECOMMENDATIONS 11/06/2015 Recommended Consults -- Oral Care Recommendations Oral care BID Other Recommendations --   CHL IP FOLLOW UP RECOMMENDATIONS 11/05/2015 Follow up Recommendations ST f/u at SNF upon d/c   CHL IP FREQUENCY AND DURATION 11/06/2015 Speech Therapy Frequency (ACUTE ONLY) min 2x/week Treatment Duration 1 week      CHL IP ORAL PHASE 11/06/2015 Oral Phase WFL Oral - Pudding Teaspoon -- Oral - Pudding Cup -- Oral - Honey Teaspoon -- Oral - Honey Cup -- Oral - Nectar Teaspoon -- Oral - Nectar Cup -- Oral - Nectar Straw -- Oral - Thin Teaspoon -- Oral - Thin Cup -- Oral - Thin Straw -- Oral - Puree -- Oral - Mech Soft -- Oral - Regular -- Oral - Multi-Consistency -- Oral - Pill -- Oral Phase - Comment --  CHL IP PHARYNGEAL PHASE 11/06/2015 Pharyngeal Phase Impaired Pharyngeal- Pudding Teaspoon -- Pharyngeal -- Pharyngeal- Pudding Cup -- Pharyngeal -- Pharyngeal- Honey Teaspoon -- Pharyngeal -- Pharyngeal- Honey Cup -- Pharyngeal -- Pharyngeal- Nectar Teaspoon -- Pharyngeal -- Pharyngeal- Nectar Cup -- Pharyngeal -- Pharyngeal- Nectar Straw -- Pharyngeal -- Pharyngeal- Thin Teaspoon -- Pharyngeal -- Pharyngeal- Thin Cup Penetration/Aspiration during swallow;Other  (Comment) Pharyngeal (No Data) Pharyngeal- Thin Straw -- Pharyngeal -- Pharyngeal- Puree Reduced epiglottic inversion;Pharyngeal residue - valleculae;Compensatory strategies attempted (with notebox) Pharyngeal -- Pharyngeal- Mechanical Soft -- Pharyngeal -- Pharyngeal- Regular Reduced epiglottic inversion;Pharyngeal residue - valleculae;Compensatory strategies attempted (with notebox) Pharyngeal -- Pharyngeal- Multi-consistency -- Pharyngeal -- Pharyngeal- Pill -- Pharyngeal -- Pharyngeal Comment --  CHL IP  CERVICAL ESOPHAGEAL PHASE 11/06/2015 Cervical Esophageal Phase WFL Pudding Teaspoon -- Pudding Cup -- Honey Teaspoon -- Honey Cup -- Nectar Teaspoon -- Nectar Cup -- Nectar Straw -- Thin Teaspoon -- Thin Cup -- Thin Straw -- Puree -- Mechanical Soft -- Regular -- Multi-consistency -- Pill -- Cervical Esophageal Comment -- ADAMS,PAT, M.S., CCC-SLP 11/06/2015, 10:31 AM              US Thoracentesis Asp Pleural Space W/img Guide  11/06/2015  Ardis Rowan, PA-C     11/06/2015  4:37 PM Successful US guided right thoracentesis. Yielded 1.6 liters of clear yellow fluid. Pt tolerated procedure well. No immediate complications. Specimen was not sent for labs. CXR ordered. Abigail Butts S BLAIR PA-C 11/06/2015 4:36 PM   US Thoracentesis Asp Pleural Space W/img Guide  11/04/2015  INDICATION: Symptomatic right sided pleural effusion EXAM: US THORACENTESIS ASP PLEURAL SPACE W/IMG GUIDE COMPARISON:  CXR 11/04/2015. MEDICATIONS: None COMPLICATIONS: None immediate TECHNIQUE: Informed written consent was obtained from the patient after a discussion of the risks, benefits and alternatives to treatment. A timeout was performed prior to the initiation of the procedure. Initial ultrasound scanning demonstrates a right pleural effusion. The lower chest was prepped and draped in the usual sterile fashion. 1% lidocaine was used for local anesthesia. An ultrasound image was saved for documentation purposes. A 6 Fr Safe-T-Centesis  catheter was introduced. The thoracentesis was performed. The catheter was removed and a dressing was applied. The patient tolerated the procedure well without immediate post procedural complication. The patient was escorted to have an upright chest radiograph. FINDINGS: A total of approximately 1.4 Liters of yellow fluid was removed. Requested samples were sent to the laboratory. IMPRESSION: Successful ultrasound-guided right sided thoracentesis yielding 1.4 liters of pleural fluid. Read By:  Tsosie Billing PA-C Electronically Signed   By: Lucrezia Europe M.D.   On: 11/04/2015 16:31     CBC  Recent Labs Lab 11/04/15 1100 11/04/15 1312 11/06/15 0233  WBC 7.4 7.1 6.9  HGB 12.8* 11.8* 11.9*  HCT 39.5 35.6* 35.3*  PLT 167.0 151 144*  MCV 96.9 95.7 95.9  MCH  --  31.7 32.3  MCHC 32.5 33.1 33.7  RDW 14.8 13.8 13.8  LYMPHSABS 1.0 1.1  --   MONOABS 1.0 1.1*  --   EOSABS 0.5 0.4  --   BASOSABS 0.0 0.0  --     Chemistries   Recent Labs Lab 11/04/15 1100 11/04/15 1312 11/05/15 0812 11/06/15 0233 11/07/15 0228  NA 137 135 135 134* 135  K 5.1 4.5 4.2 3.7 3.9  CL 103 104 103 103 99*  CO2 27 26 24 27 28   GLUCOSE 140* 115* 153* 103* 142*  BUN 20 18 19 17 19   CREATININE 1.16 1.16 1.14 1.15 1.41*  CALCIUM 9.0 8.7* 8.3* 8.3* 8.4*  MG  --  2.0  --   --   --   AST  --  28  --  25 27  ALT  --  18  --  17 19  ALKPHOS  --  123  --  115 122  BILITOT  --  0.8  --  0.6 1.1   ------------------------------------------------------------------------------------------------------------------ estimated creatinine clearance is 38.6 mL/min (by C-G formula based on Cr of 1.41). ------------------------------------------------------------------------------------------------------------------ No results for input(s): HGBA1C in the last 72 hours. ------------------------------------------------------------------------------------------------------------------ No results for input(s): CHOL, HDL, LDLCALC,  TRIG, CHOLHDL, LDLDIRECT in the last 72 hours. ------------------------------------------------------------------------------------------------------------------  Recent Labs  11/04/15 1943  TSH 6.858*   ------------------------------------------------------------------------------------------------------------------ No  results for input(s): VITAMINB12, FOLATE, FERRITIN, TIBC, IRON, RETICCTPCT in the last 72 hours.  Coagulation profile No results for input(s): INR, PROTIME in the last 168 hours.   Recent Labs  11/04/15 1100  DDIMER 0.63*    Cardiac Enzymes  Recent Labs Lab 11/04/15 1300  CKMB 2.5   ------------------------------------------------------------------------------------------------------------------ Invalid input(s): POCBNP   CBG:  Recent Labs Lab 11/06/15 0629 11/06/15 0657 11/06/15 1121 11/06/15 1717 11/06/15 2119  GLUCAP 50* 79 75 98 97       Studies: Dg Chest 1 View  11/06/2015  CLINICAL DATA:  Post right thoracentesis EXAM: CHEST 1 VIEW COMPARISON:  11/05/2015 FINDINGS: Cardiomediastinal silhouette is stable. The right pleural effusion has resolved. Mild bilateral basilar atelectasis left greater than right. Right hilar catheter is stable in position. No segmental infiltrate or pulmonary edema. No pneumothorax. IMPRESSION: The right pleural effusion has resolved. Mild bilateral basilar atelectasis left greater than right. Right hilar catheter is stable in position. No segmental infiltrate or pulmonary edema. No pneumothorax. Electronically Signed   By: Lahoma Crocker M.D.   On: 11/06/2015 17:13   Ct Angio Chest Pe W/cm &/or Wo Cm  11/05/2015  CLINICAL DATA:  Shortness of breath, pleural effusion. 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 Multiplanar CT image  reconstructions and MIPs were obtained to evaluate the vascular anatomy. CONTRAST:  58mL OMNIPAQUE IOHEXOL 350 MG/ML SOLN COMPARISON:  CT abdomen 12/16/ 15 images of the lung bases FINDINGS: Sagittal images of the spine shows osteopenia and degenerative changes thoracic spine. Sagittal view of the sternum is unremarkable. Atherosclerotic calcifications of thoracic aorta and coronary arteries. The study is of excellent technical quality. No pulmonary embolus is noted. Bilateral perihilar peribronchial thickening is noted. There is no mediastinal hematoma or adenopathy. Images of the upper abdomen shows partially visualized postsurgical changes post Whipple procedure. There is moderate size right pleural effusion. Small left pleural effusion. There is atelectasis in left lower lobe posteriorly. There is patchy airspace disease/infiltrate in right middle lobe and right lower lobe. Findings are highly suspicious for pneumonia. There is a catheter fragment in main pulmonary artery extending from just above the origin in right main pulmonary artery and a small branch. The catheter measures at least 12.5 cm in length. This was only partially visualized on prior exam stable in position from prior exam. Review of the MIP images confirms the above findings. IMPRESSION: 1. No pulmonary embolus. 2. There is moderate size right pleural effusion. Small left pleural effusion. Atelectasis noted in left lower lobe posteriorly. There is consolidation with air bronchogram in right lower lobe and right middle lobe highly suspicious for infiltrate/pneumonia. 3. Mild perihilar bronchitic changes. 4. No mediastinal hematoma or adenopathy. 5. There is a catheter fragment in main pulmonary artery extending from just above the origin in right main pulmonary artery and a small branch. The catheter measures at least 12.5 cm in length. This was only partially visualized on prior exam stable in position from prior exam. These results were called  by telephone at the time of interpretation on 11/05/2015 at 1:57 pm to Dr. Reyne Dumas , who verbally acknowledged these results. Electronically Signed   By: Lahoma Crocker M.D.   On: 11/05/2015 14:02   Dg Swallowing Func-speech Pathology  11/06/2015  Objective Swallowing Evaluation:   Patient Details Name: [name] MRN: [id number] Date of Birth: [birth year]  Today's Date: 11/06/2015 Time: SLP Start Time (ACUTE ONLY): 1009-SLP Stop Time (ACUTE ONLY): 1020 SLP Time Calculation (min) (ACUTE ONLY): 11 min Past Medical History: Past Medical History Diagnosis Date . Acid reflux disease    history of . H/O: GI bleed  . Paroxysmal atrial fibrillation (HCC)    chronic anticoag; tikosyn . Benign prostatic hypertrophy    history of . Hepatic damage march 2009   retained hepatic stone , intrahepatic duct catheter . CAD (coronary artery disease)  . Hypertension  . 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 Seizures ([place])  . Ventricular tachycardia ? Tikosyn proarrhtyhmia  . Cataracts, bilateral  . Diabetes mellitus    insulin dep . Pleural effusion 10/2015 . Shortness of breath dyspnea  Past Surgical History: Past Surgical History Procedure Laterality Date . Cardioversion   . Cholecystectomy     history of . Partial gastrectomy     history of . Shoulder surgery     left shoulder repair with 2 pens inserted . Whipple procedure     operation . 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 Inguinal hernia repair     inguinal herniorrhaphy, right... history of . Appendectomy     history of . Cardioversion  03/07/2012   Procedure: CARDIOVERSION;  Surgeon: Deboraha Sprang, MD;  Location: [facility];  Service: Cardiovascular;  Laterality: N/A; . Cardioversion N/A 08/10/2015   Procedure: CARDIOVERSION;  Surgeon: Thayer Headings, MD;  Location: [facility] ENDOSCOPY;  Service: Cardiovascular;  Laterality: N/A; HPI: [name] is a 81 y.o. male, with permanent atrial fibrillation on Eliquis, diabetes mellitus, seizures, and history of Whipple procedure who presents to the emergency department with shortness of breath. Mr. Quintin saw his primary care physician for cough and shortness of breath and was sent to the emergency department after an x-ray showed a large right pleural effusion. Mr. Honold reports that he has been feeling fatigued and badly for about the past month. But over the last week he has become much worse. He is no longer able to walk his daily 1/2 mile. He is unable to sleep. He has a dry cough but occasionally brings up frothy sputum. He also coughs when he eats or drinks liquids,  No Data Recorded Assessment / Plan / Recommendation CHL IP CLINICAL IMPRESSIONS 11/06/2015 Therapy Diagnosis Moderate pharyngeal phase dysphagia Clinical Impression Pt presents with moderate pharyngeal phase dysphagia characterized by one incidence of flash penetration with larger boluses of liquids, but clearance noted without strategies utilized; pt also exhibited moderate vallecular residue with puree-solid consistencies which required multiple repetitive swallows, effortful swallows and alternating solids/liquids to clear valleculae which place him at a moderate aspiration risk without use of strategies during po intake; decreased epiglottic inversion noted intermittently during study as well which also increases his aspiration risk.  Pt did not aspirate any consistency, but is certainly at risk if strategies are not utilized during po intake; ST to f/u during hospitalization for diet tolerance/pt education re: aspiration risk/swallowing strategy use and should be f/u with SLP at SNF. Impact on safety and function Moderate aspiration risk   CHL IP TREATMENT RECOMMENDATION 11/06/2015 Treatment Recommendations Therapy as outlined in treatment plan below   Prognosis 11/06/2015 Prognosis for Safe Diet Advancement Good Barriers to Reach  Goals -- Barriers/Prognosis Comment -- CHL IP DIET RECOMMENDATION 11/06/2015 SLP Diet Recommendations Regular  solids;Thin liquid Liquid Administration via Cup Medication Administration Whole meds with liquid Compensations Slow rate;Small sips/bites;Clear throat intermittently;Effortful swallow Postural Changes Seated upright at 90 degrees   CHL IP OTHER RECOMMENDATIONS 11/06/2015 Recommended Consults -- Oral Care Recommendations Oral care BID Other Recommendations --   CHL IP FOLLOW UP RECOMMENDATIONS 11/05/2015 Follow up Recommendations ST f/u at SNF upon d/c   CHL IP FREQUENCY AND DURATION 11/06/2015 Speech Therapy Frequency (ACUTE ONLY) min 2x/week Treatment Duration 1 week      CHL IP ORAL PHASE 11/06/2015 Oral Phase WFL Oral - Pudding Teaspoon -- Oral - Pudding Cup -- Oral - Honey Teaspoon -- Oral - Honey Cup -- Oral - Nectar Teaspoon -- Oral - Nectar Cup -- Oral - Nectar Straw -- Oral - Thin Teaspoon -- Oral - Thin Cup -- Oral - Thin Straw -- Oral - Puree -- Oral - Mech Soft -- Oral - Regular -- Oral - Multi-Consistency -- Oral - Pill -- Oral Phase - Comment --  CHL IP PHARYNGEAL PHASE 11/06/2015 Pharyngeal Phase Impaired Pharyngeal- Pudding Teaspoon -- Pharyngeal -- Pharyngeal- Pudding Cup -- Pharyngeal -- Pharyngeal- Honey Teaspoon -- Pharyngeal -- Pharyngeal- Honey Cup -- Pharyngeal -- Pharyngeal- Nectar Teaspoon -- Pharyngeal -- Pharyngeal- Nectar Cup -- Pharyngeal -- Pharyngeal- Nectar Straw -- Pharyngeal -- Pharyngeal- Thin Teaspoon -- Pharyngeal -- Pharyngeal- Thin Cup Penetration/Aspiration during swallow;Other (Comment) Pharyngeal (No Data) Pharyngeal- Thin Straw -- Pharyngeal -- Pharyngeal- Puree Reduced epiglottic inversion;Pharyngeal residue - valleculae;Compensatory strategies attempted (with notebox) Pharyngeal -- Pharyngeal- Mechanical Soft -- Pharyngeal -- Pharyngeal- Regular Reduced epiglottic inversion;Pharyngeal residue - valleculae;Compensatory strategies attempted (with notebox) Pharyngeal --  Pharyngeal- Multi-consistency -- Pharyngeal -- Pharyngeal- Pill -- Pharyngeal -- Pharyngeal Comment --  CHL IP CERVICAL ESOPHAGEAL PHASE 11/06/2015 Cervical Esophageal Phase WFL Pudding Teaspoon -- Pudding Cup -- Honey Teaspoon -- Honey Cup -- Nectar Teaspoon -- Nectar Cup -- Nectar Straw -- Thin Teaspoon -- Thin Cup -- Thin Straw -- Puree -- Mechanical Soft -- Regular -- Multi-consistency -- Pill -- Cervical Esophageal Comment -- ADAMS,PAT, M.S., CCC-SLP 11/06/2015, 10:31 AM              US Thoracentesis Asp Pleural Space W/img Guide  11/06/2015  Ardis Rowan, PA-C     11/06/2015  4:37 PM Successful US guided right thoracentesis. Yielded 1.6 liters of clear yellow fluid. Pt tolerated procedure well. No immediate complications. Specimen was not sent for labs. CXR ordered. WENDY S BLAIR PA-C 11/06/2015 4:36 PM      Lab Results  Component Value Date   HGBA1C 6.5 10/21/2015   HGBA1C 6.0 06/24/2015   HGBA1C 6.3 02/25/2015   Lab Results  Component Value Date   MICROALBUR <0.7 02/25/2015   LDLCALC 56 10/21/2015   CREATININE 1.41* 11/07/2015       Scheduled Meds: . acidophilus  1 capsule Oral Daily  . azelastine  2 spray Each Nare BID  . carvedilol  3.125 mg Oral BID WC  . cetaphil   Topical Daily  . digoxin  0.125 mg Oral Daily  . finasteride  5 mg Oral Daily  . [START ON 11/08/2015] furosemide  40 mg Intravenous Daily  . insulin aspart  0-9 Units Subcutaneous TID WC  . insulin aspart  3 Units Subcutaneous TID WC  . levofloxacin (LEVAQUIN) IV  500 mg Intravenous Q24H  . lipase/protease/amylase  12,000 Units Oral TID AC  . multivitamin with minerals  1 tablet Oral Daily  . sodium chloride  1 spray Each Nare BID  . sodium  chloride  3 mL Intravenous Q12H  . tamsulosin  0.4 mg Oral BID   Continuous Infusions:   Principal Problem:   Pleural effusion Active Problems:   Type II diabetes mellitus with manifestations (HCC)   Persistent atrial fibrillation (HCC)   CAD (coronary  artery disease)   Hypothyroid   Acute on chronic heart failure (HCC)   S/P thoracentesis   SOB (shortness of breath)   CAP (community acquired pneumonia)    Time spent: 28 minutes   [facility] Pager [phone]. If 7PM-7AM, please contact night-coverage at [email or web address], password [facility] 11/07/2015, 10:58 AM  LOS: 3 days

## 2015-11-07 NOTE — Progress Notes (Signed)
Patient lying in bed, no pain or distress. Digoxin given, patient tolerated well, asymptomatic. Call light within reach.

## 2015-11-08 LAB — COMPREHENSIVE METABOLIC PANEL
ALK PHOS: 118 U/L (ref 38–126)
ALT: 16 U/L — AB (ref 17–63)
AST: 23 U/L (ref 15–41)
Albumin: 2.5 g/dL — ABNORMAL LOW (ref 3.5–5.0)
Anion gap: 10 (ref 5–15)
BUN: 23 mg/dL — AB (ref 6–20)
CALCIUM: 8.3 mg/dL — AB (ref 8.9–10.3)
CO2: 28 mmol/L (ref 22–32)
CREATININE: 1.45 mg/dL — AB (ref 0.61–1.24)
Chloride: 96 mmol/L — ABNORMAL LOW (ref 101–111)
GFR calc non Af Amer: 41 mL/min — ABNORMAL LOW (ref >60)
GFR, EST AFRICAN AMERICAN: 48 mL/min — AB (ref >60)
Glucose, Bld: 135 mg/dL — ABNORMAL HIGH (ref 65–99)
Potassium: 3.9 mmol/L (ref 3.5–5.1)
SODIUM: 134 mmol/L — AB (ref 135–145)
Total Bilirubin: 1 mg/dL (ref 0.3–1.2)
Total Protein: 5.5 g/dL — ABNORMAL LOW (ref 6.5–8.1)

## 2015-11-08 LAB — CBC
HCT: 38.3 % — ABNORMAL LOW (ref 39.0–52.0)
HCT: 38.3 % — ABNORMAL LOW (ref 39.0–52.0)
Hemoglobin: 13.1 g/dL (ref 13.0–17.0)
Hemoglobin: 13.1 g/dL (ref 13.0–17.0)
MCH: 32.3 pg (ref 26.0–34.0)
MCH: 32.5 pg (ref 26.0–34.0)
MCHC: 34.2 g/dL (ref 30.0–36.0)
MCHC: 34.2 g/dL (ref 30.0–36.0)
MCV: 94.6 fL (ref 78.0–100.0)
MCV: 95 fL (ref 78.0–100.0)
PLATELETS: 159 10*3/uL (ref 150–400)
PLATELETS: 152 10*3/uL (ref 150–400)
RBC: 4.05 MIL/uL — ABNORMAL LOW (ref 4.22–5.81)
RBC: 4.03 MIL/uL — AB (ref 4.22–5.81)
RDW: 13.5 % (ref 11.5–15.5)
RDW: 13.5 % (ref 11.5–15.5)
WBC: 8.1 10*3/uL (ref 4.0–10.5)
WBC: 8.3 10*3/uL (ref 4.0–10.5)

## 2015-11-08 LAB — GLUCOSE, CAPILLARY
GLUCOSE-CAPILLARY: 125 mg/dL — AB (ref 65–99)
GLUCOSE-CAPILLARY: 150 mg/dL — AB (ref 65–99)
Glucose-Capillary: 161 mg/dL — ABNORMAL HIGH (ref 65–99)
Glucose-Capillary: 111 mg/dL — ABNORMAL HIGH (ref 65–99)

## 2015-11-08 MED ORDER — SACCHAROMYCES BOULARDII 250 MG PO CAPS
250.0000 mg | ORAL_CAPSULE | Freq: Every day | ORAL | Status: DC
  Administered 2015-11-08 – 2015-11-12 (×4): 250 mg via ORAL
  Filled 2015-11-08 (×4): qty 1

## 2015-11-08 NOTE — Progress Notes (Signed)
[facility] PROGRESS NOTE  [name] [id number] DOB: [birth year] DOA: 11/04/2015 PCP: [name], MD  Length of stay: 4   Assessment/Plan: Principal Problem:   Pleural effusion Active Problems:   Type II diabetes mellitus with manifestations ([place])   Persistent atrial fibrillation (HCC)   CAD (coronary artery disease)   Hypothyroid   Acute on chronic heart failure (HCC)   S/P thoracentesis   SOB (shortness of breath)   CAP (community acquired pneumonia)    Brief summary 81 y.o. male, with permanent atrial fibrillation on Eliquis, diabetes mellitus, seizures, and history of Whipple procedure who presents to the emergency department with shortness of breath. [name] saw his primary care physician this morning for cough and shortness of breath and was sent to the emergency department after an x-ray showed a large right pleural effusion. [name] reports that he has been feeling fatigued and badly for about the past month. But over the last week he has become much worse. He is no longer able to walk his daily 1/2 mile. He is unable to sleep. He has a dry cough but occasionally brings up frothy sputum. He also coughs when he eats or drinks liquids, +orthopnea, +PND, +insomnia. The Casey reports he was taken off of amiodarone approximately 3 mos ago, and was told by his cardiologist, Dr. Caryl Comes, that his afib was going to be permanent.   Assessment and plan Acute systolic heart failure, LVEF 25-30%, decreased from 50% in 2013.  Right-sided pleural effusion  Appears to be transudative in nature, await cytology results , holding Lasix in anticipation of cardiac cath tomorrow, blood pressure soft  S/p  thoracentesis twice this admission. 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 + JVD, +1 bilateral lower extremity edema. Repeat  2-D echocardiogram shows EF of 25-30%. , Previously 50%, cardiology consulted  Continue Coreg. No ACE inhibitor as blood pressure soft.  Continue Lasix, reduced dose given increasing creatinine Low blood pressure limits further up titration of Coreg, will add low dose Lanoxin to assist with rate control. Hold off on adding ACE inhibitor or ARB as well with low blood pressure and allergy to lisinopril. BMET in AM  Coronary artery calcifications noted by chest CT imaging. No prior history of obstructive CAD or myocardial infarction. Troponin I level 0.02 during this hospital stay. Cardiac cath Monday .taken off Eliquis for now  Persistent atrial fibrillation, uncontrolled despite IV digoxin, IV Cardizem failed Tikosyn and amiodarone. Recent visit with Dr. Caryl Comes was in October at which time strategy of heart rate control and anticoagulation was pursued. Currently Eliquis has been held in anticipation of diagnostic cardiac catheterization on Monday as per Dr. 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 Continue Creon  supplementation.  Hypothyroidism Not currently on thyroid hormone supplementation?  TSH 6.85. Free T4 within normal limits    DVT prophylaxsis eliquis   Code Status:      Code Status Orders        Start     Ordered   11/04/15 1629  Full code   Continuous     11/04/15 1628    Advance Directive Documentation        Most Recent Value   Type of Advance Directive  Healthcare Power of Attorney   Pre-existing out of facility DNR order (yellow form or pink MOST form)     "MOST" Form in Place?        Family Communication: Discussed in detail with the Casey, all imaging results, lab results explained to the Casey   Disposition Plan: Anticipate discharge after cath on  Monday     Consultants:  None  Procedures:  Thoracentesis  Antibiotics: Anti-infectives    Start     Dose/Rate Route Frequency Ordered Stop   11/09/15 1300  levofloxacin (LEVAQUIN) IVPB 750 mg     750 mg 100 mL/hr over 90 Minutes Intravenous Every 48 hours 11/07/15 1354     11/06/15 1100  levofloxacin (LEVAQUIN) IVPB 500 mg  Status:  Discontinued     500 mg 100 mL/hr over 60 Minutes Intravenous Every 24 hours 11/06/15 0949 11/07/15 1354         HPI/Subjective: Casey had tachycardia up into the 140s yesterday and received 1 dose of IV digoxin at 2200, also received IV Cardizem around 1500, heart rate still in the 120s, uncontrolled   Objective: Filed Vitals:   11/07/15 1451 11/07/15 2106 11/08/15 0511 11/08/15 0920  BP: 98/60 97/57 103/50   Pulse: 124 122 105 124  Temp: 98 F (36.7 C) 97.8 F (36.6 C) 97.8 F (36.6 C)   TempSrc: Oral Oral Oral   Resp: 17 18 18    Height:      Weight:   75.025 kg (165 lb 6.4 oz)   SpO2: 97% 95% 94%     Intake/Output Summary (Last 24 hours) at 11/08/15 1022 Last data filed at 11/08/15 0831  Gross per 24 hour  Intake    603 ml  Output   1150 ml  Net   -547 ml    Exam:  General: No acute respiratory distress Lungs: Clear to auscultation  bilaterally without wheezes or crackles Cardiovascular: Regular rate and rhythm without murmur gallop or rub normal S1 and S2 Abdomen: Nontender, nondistended, soft, bowel sounds positive, no rebound, no ascites, no appreciable mass Extremities: No significant cyanosis, clubbing, or edema bilateral lower extremities     Data Review   Micro Results Recent Results (from the past 240 hour(s))  Culture, body fluid-bottle     Status: None (Preliminary result)   Collection Time: 11/04/15  4:18 PM  Result Value Ref Range Status   Specimen Description FLUID PLEURAL RIGHT  Final   Special Requests BOTTLES DRAWN AEROBIC AND ANAEROBIC 10CC  Final   Culture NO GROWTH 3 DAYS  Final   Report Status PENDING  Incomplete  Gram stain     Status: None   Collection Time: 11/04/15  4:18 PM  Result Value Ref Range Status   Specimen Description FLUID PLEURAL RIGHT  Final   Special  Requests NONE  Final   Gram Stain   Final    WBC PRESENT, PREDOMINANTLY MONONUCLEAR NO ORGANISMS SEEN CYTOSPIN    Report Status 11/04/2015 FINAL  Final  MRSA PCR Screening     Status: None   Collection Time: 11/05/15  8:19 PM  Result Value Ref Range Status   MRSA by PCR NEGATIVE NEGATIVE Final    Comment:        The GeneXpert MRSA Assay (FDA approved for NASAL specimens only), is one component of a comprehensive MRSA colonization surveillance program. It is not intended to diagnose MRSA infection nor to guide or monitor treatment for MRSA infections.     Radiology Reports Dg Chest 1 View  11/06/2015  CLINICAL DATA:  Post right thoracentesis EXAM: CHEST 1 VIEW COMPARISON:  11/05/2015 FINDINGS: Cardiomediastinal silhouette is stable. The right pleural effusion has resolved. Mild bilateral basilar atelectasis left greater than right. Right hilar catheter is stable in position. No segmental infiltrate or pulmonary edema. No pneumothorax. IMPRESSION: The right pleural effusion has resolved. Mild bilateral basilar  atelectasis left greater than right. Right hilar catheter is stable in position. No segmental infiltrate or pulmonary edema. No pneumothorax. Electronically Signed   By: Lahoma Crocker M.D.   On: 11/06/2015 17:13   Dg Chest 1 View  11/04/2015  CLINICAL DATA:  Initial encounter for status post right thoracentesis. EXAM: CHEST 1 VIEW COMPARISON:  11/04/2015, earlier the same day. FINDINGS: Interval decrease in right pleural effusion no evidence for pneumothorax. Stable appearance of catheter tubing over the medial right hemi thorax and heart silhouette. Left lung remains clear. The cardio pericardial silhouette is enlarged. Telemetry leads overlie the chest. IMPRESSION: No evidence for pneumothorax status post thoracentesis. Electronically Signed   By: Misty Stanley M.D.   On: 11/04/2015 16:56   Dg Chest 2 View  11/05/2015  CLINICAL DATA:  History of recent thoracentesis forl reduction of right pleural effusion, cough and congestion EXAM: CHEST  2 VIEW COMPARISON:  Portable chest x-ray of 11/04/2015 FINDINGS: After right thoracentesis, there is little change in the small right pleural effusion remaining with right basilar atelectasis. The left lung is clear. Cardiomegaly is stable. No pneumothorax is seen. IMPRESSION: No significant change after right thoracentesis in the volume of the right pleural effusion with right basilar atelectasis. Electronically Signed   By: Ivar Drape M.D.   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 EXAM: CHEST  2 VIEW COMPARISON:  PA and lateral chest x-ray of June 22, 2013 FINDINGS: There is a new moderate-sized right pleural effusion occupying 1/2 of the right hemithorax. Right basilar atelectasis or pneumonia may be obscured. There is small amount of atelectasis or infiltrate at the left lung base medially. A  trace of pleural fluid on the left is suspected. The cardiac silhouette is mildly enlarged. The pulmonary vascularity is normal. The bony thorax exhibits no acute abnormality. IMPRESSION: Since the previous study the Casey has developed a moderate sized right pleural effusion. This may be obscuring right basilar atelectasis, pneumonia, or mass. There is a trace left pleural effusion and minimal left basilar atelectasis or infiltrate. There is no evidence of CHF. Chest CT scanning is recommended. Electronically Signed   By: David  Martinique M.D.   On: 11/04/2015 11:30   Ct Angio Chest Pe W/cm &/or Wo Cm  11/05/2015  CLINICAL DATA:  Shortness of breath, pleural effusion. 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 Multiplanar CT image reconstructions and MIPs were obtained to evaluate the vascular anatomy. CONTRAST:  73mL OMNIPAQUE IOHEXOL 350 MG/ML SOLN COMPARISON:  CT abdomen 12/16/ 15 images of the lung bases FINDINGS: Sagittal images of the spine shows osteopenia and degenerative changes thoracic spine. Sagittal view of the sternum is unremarkable. Atherosclerotic calcifications of thoracic aorta and coronary arteries. The study is of excellent technical quality. No pulmonary embolus is noted. Bilateral perihilar peribronchial thickening is noted. There is no mediastinal hematoma or adenopathy. Images of the upper abdomen shows partially visualized postsurgical changes post Whipple procedure. There is moderate size right pleural effusion. Small left pleural effusion. There is atelectasis in left lower lobe posteriorly. There is patchy airspace disease/infiltrate in right middle lobe and right lower lobe. Findings are highly suspicious for pneumonia. There is a catheter fragment in main pulmonary artery extending from just  above the origin in right main pulmonary artery and a small branch. The catheter measures at least 12.5 cm in length. This was only partially visualized on prior exam stable in position from prior exam. Review of the MIP images confirms the above findings. IMPRESSION: 1. No pulmonary embolus. 2. There is moderate size right pleural effusion. Small left pleural effusion. Atelectasis noted in left lower lobe posteriorly. There is consolidation with air bronchogram in right lower lobe and right middle lobe highly suspicious for infiltrate/pneumonia. 3. Mild perihilar bronchitic changes. 4. No mediastinal hematoma or adenopathy. 5. There is a catheter fragment in main pulmonary artery extending from just above the origin in right main pulmonary artery and a small branch. The catheter measures at least 12.5 cm in length. This was only partially visualized on prior exam stable in position from prior exam. These results were called by telephone at the time of interpretation on 11/05/2015 at 1:57 pm to Dr. Reyne Dumas , who verbally acknowledged these results. Electronically Signed   By: Lahoma Crocker M.D.   On: 11/05/2015 14:02   Dg Swallowing Func-speech Pathology  11/06/2015  Objective Swallowing Evaluation:   [name] MRN: [id number] Date of Birth: Oct 03, 1925 Today's Date: 11/06/2015 Time: SLP Start Time (ACUTE ONLY): 1009-SLP Stop Time (ACUTE ONLY): 1020 SLP Time Calculation (min) (ACUTE ONLY): 11 min Past Medical History: Past Medical History Diagnosis Date . Acid reflux disease    history of . H/O: GI bleed  . Paroxysmal atrial fibrillation (HCC)    chronic anticoag; tikosyn . Benign prostatic hypertrophy    history of . Hepatic damage march 2009   retained hepatic stone , intrahepatic duct catheter . CAD (coronary artery disease)  . Hypertension  . 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 Seizures ([place])  . Ventricular tachycardia ? Tikosyn proarrhtyhmia  . Cataracts, bilateral  . Diabetes mellitus    insulin dep . Pleural effusion 10/2015 . Shortness of breath dyspnea  Past Surgical History: Past Surgical History Procedure Laterality Date . Cardioversion   . Cholecystectomy     history of . Partial gastrectomy     history of . Shoulder surgery     left shoulder repair with 2 pens inserted . Whipple  procedure     operation . 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 Inguinal hernia repair     inguinal herniorrhaphy, right... history of . Appendectomy     history of . Cardioversion  03/07/2012   Procedure: CARDIOVERSION;  Surgeon: Deboraha Sprang, MD;  Location: [place];  Service: Cardiovascular;  Laterality: N/A; . Cardioversion N/A 08/10/2015   Procedure: CARDIOVERSION;  Surgeon: Thayer Headings, MD;  Location[facility];  Service: Cardiovascular;  Laterality: N/A; HPI: [name] is a 81 y.o. male, with permanent atrial fibrillation on Eliquis, diabetes mellitus, seizures, and history of Whipple procedure who presents to the emergency department with shortness of breath. [name] saw his primary care physician for cough and shortness of breath and was sent to the emergency department after an x-ray showed a large right pleural effusion. [name] reports that he has been feeling fatigued and badly for about the past month. But over the last week he has become much worse. He is no longer able to walk his daily 1/2 mile. He is unable to sleep. He has a dry cough but occasionally brings up frothy sputum. He also coughs when he eats or drinks liquids,  No Data Recorded Assessment / Plan / Recommendation CHL IP CLINICAL IMPRESSIONS 11/06/2015 Therapy Diagnosis Moderate pharyngeal phase dysphagia Clinical Impression Pt presents with moderate pharyngeal phase dysphagia characterized by one incidence of flash penetration with larger boluses of liquids, but clearance  noted without strategies utilized; pt also exhibited moderate vallecular residue with puree-solid consistencies which required multiple repetitive swallows, effortful swallows and alternating solids/liquids to clear valleculae which place him at a moderate aspiration risk without use of strategies during po intake; decreased epiglottic inversion noted intermittently during study as well which also increases his aspiration risk.  Pt did not aspirate any consistency, but is certainly at risk if strategies are not utilized during po intake; ST to f/u during hospitalization for diet tolerance/pt education re: aspiration risk/swallowing strategy use and should be f/u with SLP at SNF. Impact on safety and function Moderate aspiration risk   CHL IP TREATMENT RECOMMENDATION 11/06/2015 Treatment Recommendations Therapy as outlined in treatment plan below   Prognosis 11/06/2015 Prognosis for Safe Diet Advancement Good Barriers to Reach Goals -- Barriers/Prognosis Comment -- CHL IP DIET RECOMMENDATION 11/06/2015 SLP Diet Recommendations Regular solids;Thin liquid Liquid Administration via Cup Medication Administration Whole meds with liquid Compensations Slow rate;Small sips/bites;Clear throat intermittently;Effortful swallow Postural Changes Seated upright at 90 degrees   CHL IP OTHER RECOMMENDATIONS 11/06/2015 Recommended Consults -- Oral Care Recommendations Oral care BID Other Recommendations --   CHL IP FOLLOW UP RECOMMENDATIONS 11/05/2015 Follow up Recommendations ST f/u at SNF upon d/c   CHL IP FREQUENCY AND DURATION 11/06/2015 Speech Therapy Frequency (ACUTE ONLY) min 2x/week Treatment Duration 1 week      CHL IP ORAL PHASE 11/06/2015 Oral Phase WFL Oral - Pudding Teaspoon -- Oral - Pudding Cup -- Oral - Honey Teaspoon -- Oral - Honey Cup -- Oral - Nectar Teaspoon -- Oral - Nectar Cup -- Oral - Nectar Straw -- Oral - Thin Teaspoon -- Oral - Thin Cup -- Oral - Thin Straw -- Oral - Puree -- Oral - Mech Soft -- Oral - Regular --  Oral - Multi-Consistency -- Oral - Pill -- Oral Phase - Comment --  CHL IP PHARYNGEAL PHASE 11/06/2015 Pharyngeal Phase Impaired Pharyngeal- Pudding Teaspoon -- Pharyngeal -- Pharyngeal- Pudding Cup -- Pharyngeal -- Pharyngeal- Honey Teaspoon -- Pharyngeal -- Pharyngeal- Honey Cup --  Pharyngeal -- Pharyngeal- Nectar Teaspoon -- Pharyngeal -- Pharyngeal- Nectar Cup -- Pharyngeal -- Pharyngeal- Nectar Straw -- Pharyngeal -- Pharyngeal- Thin Teaspoon -- Pharyngeal -- Pharyngeal- Thin Cup Penetration/Aspiration during swallow;Other (Comment) Pharyngeal (No Data) Pharyngeal- Thin Straw -- Pharyngeal -- Pharyngeal- Puree Reduced epiglottic inversion;Pharyngeal residue - valleculae;Compensatory strategies attempted (with notebox) Pharyngeal -- Pharyngeal- Mechanical Soft -- Pharyngeal -- Pharyngeal- Regular Reduced epiglottic inversion;Pharyngeal residue - valleculae;Compensatory strategies attempted (with notebox) Pharyngeal -- Pharyngeal- Multi-consistency -- Pharyngeal -- Pharyngeal- Pill -- Pharyngeal -- Pharyngeal Comment --  CHL IP CERVICAL ESOPHAGEAL PHASE 11/06/2015 Cervical Esophageal Phase WFL Pudding Teaspoon -- Pudding Cup -- Honey Teaspoon -- Honey Cup -- Nectar Teaspoon -- Nectar Cup -- Nectar Straw -- Thin Teaspoon -- Thin Cup -- Thin Straw -- Puree -- Mechanical Soft -- Regular -- Multi-consistency -- Pill -- Cervical Esophageal Comment -- ADAMS,PAT, M.S., CCC-SLP 11/06/2015, 10:31 AM              US Thoracentesis Asp Pleural Space W/img Guide  11/06/2015  Ardis Rowan, PA-C     11/06/2015  4:37 PM Successful US guided right thoracentesis. Yielded 1.6 liters of clear yellow fluid. Pt tolerated procedure well. No immediate complications. Specimen was not sent for labs. CXR ordered. Abigail Butts S BLAIR PA-C 11/06/2015 4:36 PM   US Thoracentesis Asp Pleural Space W/img Guide  11/04/2015  INDICATION: Symptomatic right sided pleural effusion EXAM: US THORACENTESIS ASP PLEURAL SPACE W/IMG GUIDE COMPARISON:  CXR  11/04/2015. MEDICATIONS: None COMPLICATIONS: None immediate TECHNIQUE: Informed written consent was obtained from the Casey after a discussion of the risks, benefits and alternatives to treatment. A timeout was performed prior to the initiation of the procedure. Initial ultrasound scanning demonstrates a right pleural effusion. The lower chest was prepped and draped in the usual sterile fashion. 1% lidocaine was used for local anesthesia. An ultrasound image was saved for documentation purposes. A 6 Fr Safe-T-Centesis catheter was introduced. The thoracentesis was performed. The catheter was removed and a dressing was applied. The Casey tolerated the procedure well without immediate post procedural complication. The Casey was escorted to have an upright chest radiograph. FINDINGS: A total of approximately 1.4 Liters of yellow fluid was removed. Requested samples were sent to the laboratory. IMPRESSION: Successful ultrasound-guided right sided thoracentesis yielding 1.4 liters of pleural fluid. Read By:  Tsosie Billing PA-C Electronically Signed   By: Lucrezia Europe M.D.   On: 11/04/2015 16:31     CBC  Recent Labs Lab 11/04/15 1100 11/04/15 1312 11/06/15 0233 11/08/15 0040 11/08/15 0257  WBC 7.4 7.1 6.9 8.1 8.3  HGB 12.8* 11.8* 11.9* 13.1 13.1  HCT 39.5 35.6* 35.3* 38.3* 38.3*  PLT 167.0 151 144* 159 152  MCV 96.9 95.7 95.9 94.6 95.0  MCH  --  31.7 32.3 32.3 32.5  MCHC 32.5 33.1 33.7 34.2 34.2  RDW 14.8 13.8 13.8 13.5 13.5  LYMPHSABS 1.0 1.1  --   --   --   MONOABS 1.0 1.1*  --   --   --   EOSABS 0.5 0.4  --   --   --   BASOSABS 0.0 0.0  --   --   --     Chemistries   Recent Labs Lab 11/04/15 1312 11/05/15 0812 11/06/15 0233 11/07/15 0228 11/08/15 0257  NA 135 135 134* 135 134*  K 4.5 4.2 3.7 3.9 3.9  CL 104 103 103 99* 96*  CO2 26 24 27 28 28   GLUCOSE 115* 153* 103* 142* 135*  BUN 18  19 17 19  23*  CREATININE 1.16 1.14 1.15 1.41* 1.45*  CALCIUM 8.7* 8.3* 8.3* 8.4* 8.3*  MG  2.0  --   --   --   --   AST 28  --  25 27 23   ALT 18  --  17 19 16*  ALKPHOS 123  --  115 122 118  BILITOT 0.8  --  0.6 1.1 1.0   ------------------------------------------------------------------------------------------------------------------ estimated creatinine clearance is 37.4 mL/min (by C-G formula based on Cr of 1.45). ------------------------------------------------------------------------------------------------------------------ No results for input(s): HGBA1C in the last 72 hours. ------------------------------------------------------------------------------------------------------------------ No results for input(s): CHOL, HDL, LDLCALC, TRIG, CHOLHDL, LDLDIRECT in the last 72 hours. ------------------------------------------------------------------------------------------------------------------ No results for input(s): TSH, T4TOTAL, T3FREE, THYROIDAB in the last 72 hours.  Invalid input(s): FREET3 ------------------------------------------------------------------------------------------------------------------ No results for input(s): VITAMINB12, FOLATE, FERRITIN, TIBC, IRON, RETICCTPCT in the last 72 hours.  Coagulation profile No results for input(s): INR, PROTIME in the last 168 hours.  No results for input(s): DDIMER in the last 72 hours.  Cardiac Enzymes  Recent Labs Lab 11/04/15 1300  CKMB 2.5   ------------------------------------------------------------------------------------------------------------------ Invalid input(s): POCBNP   CBG:  Recent Labs Lab 11/07/15 0610 11/07/15 1154 11/07/15 1634 11/07/15 2134 11/08/15 0606  GLUCAP 131* 119* 153* 79 125*       Studies: Dg Chest 1 View  11/06/2015  CLINICAL DATA:  Post right thoracentesis EXAM: CHEST 1 VIEW COMPARISON:  11/05/2015 FINDINGS: Cardiomediastinal silhouette is stable. The right pleural effusion has resolved. Mild bilateral basilar atelectasis left greater than right. Right hilar  catheter is stable in position. No segmental infiltrate or pulmonary edema. No pneumothorax. IMPRESSION: The right pleural effusion has resolved. Mild bilateral basilar atelectasis left greater than right. Right hilar catheter is stable in position. No segmental infiltrate or pulmonary edema. No pneumothorax. Electronically Signed   By: Lahoma Crocker M.D.   On: 11/06/2015 17:13   US Thoracentesis Asp Pleural Space W/img Guide  11/06/2015  Ardis Rowan, PA-C     11/06/2015  4:37 PM Successful US guided right thoracentesis. Yielded 1.6 liters of clear yellow fluid. Pt tolerated procedure well. No immediate complications. Specimen was not sent for labs. CXR ordered. WENDY S BLAIR PA-C 11/06/2015 4:36 PM      Lab Results  Component Value Date   HGBA1C 6.5 10/21/2015   HGBA1C 6.0 06/24/2015   HGBA1C 6.3 02/25/2015   Lab Results  Component Value Date   MICROALBUR <0.7 02/25/2015   LDLCALC 56 10/21/2015   CREATININE 1.45* 11/08/2015       Scheduled Meds: . azelastine  2 spray Each Nare BID  . carvedilol  3.125 mg Oral BID WC  . cetaphil   Topical Daily  . digoxin  0.125 mg Oral Daily  . finasteride  5 mg Oral Daily  . insulin aspart  0-9 Units Subcutaneous TID WC  . insulin aspart  3 Units Subcutaneous TID WC  . [START ON 11/09/2015] levofloxacin (LEVAQUIN) IV  750 mg Intravenous Q48H  . lipase/protease/amylase  12,000 Units Oral TID AC  . multivitamin with minerals  1 tablet Oral Daily  . saccharomyces boulardii  250 mg Oral Daily  . sodium chloride  1 spray Each Nare BID  . sodium chloride  3 mL Intravenous Q12H  . tamsulosin  0.4 mg Oral BID   Continuous Infusions:   Principal Problem:   Pleural effusion Active Problems:   Type II diabetes mellitus with manifestations (HCC)   Persistent atrial fibrillation (HCC)   CAD (coronary artery disease)  Hypothyroid   Acute on chronic heart failure (HCC)   S/P thoracentesis   SOB (shortness of breath)   CAP (community acquired  pneumonia)    Time spent: 66 minutes   [facility] Pager [phone]. If 7PM-7AM, please contact night-coverage at [email or web address], password [facility] 11/08/2015, 10:22 AM  LOS: 4 days

## 2015-11-08 NOTE — Progress Notes (Addendum)
Primary cardiologist: Dr. Virl Axe  Seen for followup: AF, cardiomyopathy  Subjective:    No change in breathing status, comfortable at rest. No chest pain or sense of palpitations.  Objective:   Temp:  [97.8 F (36.6 C)-98 F (36.7 C)] 97.8 F (36.6 C) (12/11 0511) Pulse Rate:  [105-124] 105 (12/11 0511) Resp:  [17-18] 18 (12/11 0511) BP: (97-103)/(50-60) 103/50 mmHg (12/11 0511) SpO2:  [94 %-97 %] 94 % (12/11 0511) Weight:  [165 lb 6.4 oz (75.025 kg)] 165 lb 6.4 oz (75.025 kg) (12/11 0511) Last BM Date: 11/07/15  Filed Weights   11/06/15 0544 11/07/15 0406 11/08/15 0511  Weight: 175 lb 14.4 oz (79.788 kg) 166 lb 1.6 oz (75.342 kg) 165 lb 6.4 oz (75.025 kg)    Intake/Output Summary (Last 24 hours) at 11/08/15 [id number] Last data filed at 11/08/15 0511  Gross per 24 hour  Intake    603 ml  Output   1300 ml  Net   -697 ml    Telemetry: Atrial fibrillation.  Exam:  General: Elderly male, appears comfortable at rest.  HEENT: Conjunctiva and lids normal, all Cecille Rubin clear.  Lungs: Decreased breath sounds at the right base. No wheezing or labored breathing.  Cardiac: Irregularly irregular with soft apical systolic murmur. No elevated JVP.  Abdomen: NABS.  Extremities: No pitting edema.  Lab Results:  Basic Metabolic Panel:  Recent Labs Lab 11/04/15 1312  11/06/15 0233 11/07/15 0228 11/08/15 0257  NA 135  < > 134* 135 134*  K 4.5  < > 3.7 3.9 3.9  CL 104  < > 103 99* 96*  CO2 26  < > 27 28 28   GLUCOSE 115*  < > 103* 142* 135*  BUN 18  < > 17 19 23*  CREATININE 1.16  < > 1.15 1.41* 1.45*  CALCIUM 8.7*  < > 8.3* 8.4* 8.3*  MG 2.0  --   --   --   --   < > = values in this interval not displayed.  Liver Function Tests:  Recent Labs Lab 11/06/15 0233 11/07/15 0228 11/08/15 0257  AST 25 27 23   ALT 17 19 16*  ALKPHOS 115 122 118  BILITOT 0.6 1.1 1.0  PROT 5.6* 5.7* 5.5*  ALBUMIN 2.7* 2.7* 2.5*    CBC:  Recent Labs Lab 11/06/15 0233  11/08/15 0040 11/08/15 0257  WBC 6.9 8.1 8.3  HGB 11.9* 13.1 13.1  HCT 35.3* 38.3* 38.3*  MCV 95.9 94.6 95.0  PLT 144* 159 152    Cardiac Enzymes:  Recent Labs Lab 11/04/15 1300  CKTOTAL 42  CKMB 2.5   Echocardiogram 11/05/2015: Study Conclusions  - Left ventricle: The cavity size was normal. Systolic function was severely reduced. The estimated ejection fraction was in the range of 25% to 30%. Diffuse hypokinesis. - Aortic valve: Trileaflet; mildly thickened, mildly calcified leaflets. Cusp separation was reduced. - Aorta: The aorta was moderately calcified. - Mitral valve: Moderately calcified annulus. There was mild regurgitation. - Left atrium: The atrium was moderately dilated. - Right ventricle: The cavity size was moderately dilated. Wall thickness was normal. Systolic function was moderately reduced. - Right atrium: The atrium was severely dilated. - Tricuspid valve: There was moderate regurgitation. - Pulmonic valve: There was moderate regurgitation.  Impressions:  - EF is reduced when compared to prior study (50%).   Medications:   Scheduled Medications: . azelastine  2 spray Each Nare BID  . carvedilol  3.125 mg Oral BID WC  .  cetaphil   Topical Daily  . digoxin  0.125 mg Oral Daily  . finasteride  5 mg Oral Daily  . furosemide  40 mg Intravenous Daily  . insulin aspart  0-9 Units Subcutaneous TID WC  . insulin aspart  3 Units Subcutaneous TID WC  . [START ON 11/09/2015] levofloxacin (LEVAQUIN) IV  750 mg Intravenous Q48H  . lipase/protease/amylase  12,000 Units Oral TID AC  . multivitamin with minerals  1 tablet Oral Daily  . saccharomyces boulardii  250 mg Oral Daily  . sodium chloride  1 spray Each Nare BID  . sodium chloride  3 mL Intravenous Q12H  . tamsulosin  0.4 mg Oral BID     PRN Medications: sodium chloride, acetaminophen, diltiazem, magnesium hydroxide, ondansetron (ZOFRAN) IV, sodium chloride   Assessment:   1.  Persistent atrial fibrillation, failed Tikosyn and amiodarone. Recent visit with Dr. Caryl Comes was in October at which time strategy of heart rate control and anticoagulation was pursued. Currently Eliquis has been held in anticipation of diagnostic cardiac catheterization on Monday as per Dr. Debara Pickett.  2. Acute systolic heart failure, LVEF 25-30%, decreased from 50% in 2013. Status post thoracentesis of moderate right sided pleural effusion, and is diuresing on IV Lasix.  3. Coronary artery calcifications noted by chest CT imaging. No prior history of obstructive CAD or myocardial infarction. Troponin I level 0.02 during this hospital stay.  4. History of hypothyroidism, recent TSH 6.8.  5. Essential hypertension, blood pressures have been low and limiting medical therapy to some degree.  6. Acute renal insufficiency, creatinine has bumped up to 1.4.   Plan/Discussion:    Patient scheduled for left and right heart catheterization tomorrow as per Dr. Debara Pickett. He has been taken off Eliquis for now. Plan to stop Lasix today with bump in creatinine. Low blood pressure limits further up titration of Coreg, Lanoxin was added to assist with rate control. Hold off on adding ACE inhibitor or ARB as well with low blood pressure and allergy to lisinopril. BMET in AM.   Satira Sark, M.D., F.A.C.C.

## 2015-11-09 ENCOUNTER — Encounter (HOSPITAL_COMMUNITY): Payer: Self-pay | Admitting: Internal Medicine

## 2015-11-09 ENCOUNTER — Inpatient Hospital Stay (HOSPITAL_COMMUNITY): Payer: Medicare Other

## 2015-11-09 ENCOUNTER — Encounter (HOSPITAL_COMMUNITY)
Admission: EM | Disposition: A | Discharge status: Home / Self Care | Payer: Medicare Other | Source: Home / Self Care | Attending: Internal Medicine

## 2015-11-09 DIAGNOSIS — I5023 Acute on chronic systolic (congestive) heart failure: Secondary | ICD-10-CM | POA: Insufficient documentation

## 2015-11-09 DIAGNOSIS — I2510 Atherosclerotic heart disease of native coronary artery without angina pectoris: Secondary | ICD-10-CM

## 2015-11-09 HISTORY — PX: CARDIAC CATHETERIZATION: SHX172

## 2015-11-09 LAB — POCT I-STAT 3, VENOUS BLOOD GAS (G3P V)
ACID-BASE EXCESS: 5 mmol/L — AB (ref 0.0–2.0)
Acid-Base Excess: 3 mmol/L — ABNORMAL HIGH (ref 0.0–2.0)
BICARBONATE: 29.5 meq/L — AB (ref 20.0–24.0)
Bicarbonate: 27.2 mEq/L — ABNORMAL HIGH (ref 20.0–24.0)
O2 Saturation: 58 %
O2 Saturation: 61 %
PCO2 VEN: 39.6 mmHg — AB (ref 45.0–50.0)
PH VEN: 7.444 — AB (ref 7.250–7.300)
PH VEN: 7.445 — AB (ref 7.250–7.300)
TCO2: 28 mmol/L (ref 0–100)
TCO2: 31 mmol/L (ref 0–100)
pCO2, Ven: 42.9 mmHg — ABNORMAL LOW (ref 45.0–50.0)
pO2, Ven: 29 mmHg — CL (ref 30.0–45.0)
pO2, Ven: 30 mmHg (ref 30.0–45.0)

## 2015-11-09 LAB — GLUCOSE, CAPILLARY
GLUCOSE-CAPILLARY: 161 mg/dL — AB (ref 65–99)
GLUCOSE-CAPILLARY: 113 mg/dL — AB (ref 65–99)
GLUCOSE-CAPILLARY: 123 mg/dL — AB (ref 65–99)
GLUCOSE-CAPILLARY: 181 mg/dL — AB (ref 65–99)
GLUCOSE-CAPILLARY: 109 mg/dL — AB (ref 65–99)

## 2015-11-09 LAB — POCT I-STAT 3, ART BLOOD GAS (G3+)
ACID-BASE EXCESS: 6 mmol/L — AB (ref 0.0–2.0)
Bicarbonate: 29.2 mEq/L — ABNORMAL HIGH (ref 20.0–24.0)
O2 SAT: 93 %
TCO2: 30 mmol/L (ref 0–100)
pCO2 arterial: 38.2 mmHg (ref 35.0–45.0)
pH, Arterial: 7.49 — ABNORMAL HIGH (ref 7.350–7.450)
pO2, Arterial: 62 mmHg — ABNORMAL LOW (ref 80.0–100.0)

## 2015-11-09 LAB — CULTURE, BODY FLUID W GRAM STAIN -BOTTLE: Culture: NO GROWTH

## 2015-11-09 LAB — PROTIME-INR
INR: 1.32 (ref 0.00–1.49)
PROTHROMBIN TIME: 16.6 s — AB (ref 11.6–15.2)

## 2015-11-09 LAB — CBC
HEMATOCRIT: 40.3 % (ref 39.0–52.0)
HEMOGLOBIN: 13.3 g/dL (ref 13.0–17.0)
MCH: 31.5 pg (ref 26.0–34.0)
MCHC: 33 g/dL (ref 30.0–36.0)
MCV: 95.5 fL (ref 78.0–100.0)
Platelets: 148 10*3/uL — ABNORMAL LOW (ref 150–400)
RBC: 4.22 MIL/uL (ref 4.22–5.81)
RDW: 13.4 % (ref 11.5–15.5)
WBC: 7.9 10*3/uL (ref 4.0–10.5)

## 2015-11-09 LAB — COMPREHENSIVE METABOLIC PANEL
ALK PHOS: 127 U/L — AB (ref 38–126)
ALT: 18 U/L (ref 17–63)
AST: 25 U/L (ref 15–41)
Albumin: 2.5 g/dL — ABNORMAL LOW (ref 3.5–5.0)
Anion gap: 7 (ref 5–15)
BILIRUBIN TOTAL: 0.9 mg/dL (ref 0.3–1.2)
BUN: 26 mg/dL — AB (ref 6–20)
CALCIUM: 8.3 mg/dL — AB (ref 8.9–10.3)
CO2: 29 mmol/L (ref 22–32)
CREATININE: 1.26 mg/dL — AB (ref 0.61–1.24)
Chloride: 97 mmol/L — ABNORMAL LOW (ref 101–111)
GFR, EST AFRICAN AMERICAN: 57 mL/min — AB (ref >60)
GFR, EST NON AFRICAN AMERICAN: 49 mL/min — AB (ref >60)
Glucose, Bld: 134 mg/dL — ABNORMAL HIGH (ref 65–99)
Potassium: 3.8 mmol/L (ref 3.5–5.1)
Sodium: 133 mmol/L — ABNORMAL LOW (ref 135–145)
Total Protein: 5.8 g/dL — ABNORMAL LOW (ref 6.5–8.1)

## 2015-11-09 LAB — CULTURE, BODY FLUID-BOTTLE

## 2015-11-09 SURGERY — RIGHT/LEFT HEART CATH AND CORONARY ANGIOGRAPHY
Anesthesia: LOCAL

## 2015-11-09 MED ORDER — APIXABAN 2.5 MG PO TABS
2.5000 mg | ORAL_TABLET | Freq: Two times a day (BID) | ORAL | Status: DC
  Administered 2015-11-09 – 2015-11-12 (×6): 2.5 mg via ORAL
  Filled 2015-11-09 (×7): qty 1

## 2015-11-09 MED ORDER — LIDOCAINE HCL (PF) 1 % IJ SOLN
INTRAMUSCULAR | Status: DC | PRN
  Administered 2015-11-09: 09:00:00

## 2015-11-09 MED ORDER — CARVEDILOL 3.125 MG PO TABS: 3.1250 mg | ORAL_TABLET | Freq: Two times a day (BID) | ORAL | Status: DC

## 2015-11-09 MED ORDER — NITROGLYCERIN 1 MG/10 ML FOR IR/CATH LAB
INTRA_ARTERIAL | Status: AC
  Filled 2015-11-09: qty 10

## 2015-11-09 MED ORDER — SODIUM CHLORIDE 0.9 % IV SOLN: 250.0000 mL | INTRAVENOUS | Status: DC | PRN

## 2015-11-09 MED ORDER — APIXABAN 2.5 MG PO TABS: 2.5000 mg | ORAL_TABLET | Freq: Two times a day (BID) | ORAL | Status: DC

## 2015-11-09 MED ORDER — SODIUM CHLORIDE 0.9 % IV SOLN
INTRAVENOUS | Status: DC
  Administered 2015-11-09: 07:00:00 via INTRAVENOUS

## 2015-11-09 MED ORDER — LEVOFLOXACIN 750 MG PO TABS: 750.0000 mg | ORAL_TABLET | ORAL | Status: DC

## 2015-11-09 MED ORDER — HEPARIN (PORCINE) IN NACL 2-0.9 UNIT/ML-% IJ SOLN
INTRAMUSCULAR | Status: AC
  Filled 2015-11-09: qty 1000

## 2015-11-09 MED ORDER — SODIUM CHLORIDE 0.9 % IJ SOLN: 3.0000 mL | INTRAMUSCULAR | Status: DC | PRN

## 2015-11-09 MED ORDER — ACETAMINOPHEN 325 MG PO TABS
650.0000 mg | ORAL_TABLET | ORAL | Status: DC | PRN
  Administered 2015-11-09: 650 mg via ORAL

## 2015-11-09 MED ORDER — SODIUM CHLORIDE 0.9 % IJ SOLN
3.0000 mL | Freq: Two times a day (BID) | INTRAMUSCULAR | Status: DC
  Administered 2015-11-09 – 2015-11-11 (×4): 3 mL via INTRAVENOUS

## 2015-11-09 MED ORDER — SODIUM CHLORIDE 0.9 % IJ SOLN
3.0000 mL | Freq: Two times a day (BID) | INTRAMUSCULAR | Status: DC
  Administered 2015-11-10 – 2015-11-11 (×3): 3 mL via INTRAVENOUS

## 2015-11-09 MED ORDER — IOHEXOL 350 MG/ML SOLN
INTRAVENOUS | Status: DC | PRN
  Administered 2015-11-09: 30 mL via INTRACARDIAC

## 2015-11-09 MED ORDER — ASPIRIN 81 MG PO CHEW
81.0000 mg | CHEWABLE_TABLET | ORAL | Status: AC
  Administered 2015-11-09: 81 mg via ORAL
  Filled 2015-11-09: qty 1

## 2015-11-09 MED ORDER — SODIUM CHLORIDE 0.9 % IV SOLN: INTRAVENOUS | Status: AC

## 2015-11-09 MED ORDER — FUROSEMIDE 20 MG PO TABS: 20.0000 mg | ORAL_TABLET | ORAL | Status: DC

## 2015-11-09 MED ORDER — DIGOXIN 125 MCG PO TABS: 0.1250 mg | ORAL_TABLET | Freq: Every day | ORAL | Status: DC

## 2015-11-09 MED ORDER — LEVOFLOXACIN 750 MG PO TABS
750.0000 mg | ORAL_TABLET | ORAL | Status: DC
  Administered 2015-11-09 – 2015-11-11 (×2): 750 mg via ORAL
  Filled 2015-11-09 (×2): qty 1

## 2015-11-09 MED ORDER — ONDANSETRON HCL 4 MG/2ML IJ SOLN: 4.0000 mg | Freq: Four times a day (QID) | INTRAMUSCULAR | Status: DC | PRN

## 2015-11-09 MED ORDER — LIDOCAINE HCL (PF) 1 % IJ SOLN
INTRAMUSCULAR | Status: AC
  Filled 2015-11-09: qty 30

## 2015-11-09 SURGICAL SUPPLY — 10 items
CATH INFINITI 5FR MULTPACK ANG (CATHETERS) ×2 IMPLANT
CATH SWAN GANZ 7F STRAIGHT (CATHETERS) ×2 IMPLANT
KIT HEART LEFT (KITS) ×2 IMPLANT
PACK CARDIAC CATHETERIZATION (CUSTOM PROCEDURE TRAY) ×2 IMPLANT
SHEATH PINNACLE 5F 10CM (SHEATH) ×2 IMPLANT
SHEATH PINNACLE 7F 10CM (SHEATH) ×2 IMPLANT
SYR MEDRAD MARK V 150ML (SYRINGE) ×2 IMPLANT
TRANSDUCER W/STOPCOCK (MISCELLANEOUS) ×2 IMPLANT
TUBING CIL FLEX 10 FLL-RA (TUBING) ×2 IMPLANT
WIRE EMERALD 3MM-J .035X150CM (WIRE) ×2 IMPLANT

## 2015-11-09 NOTE — H&P (View-Only) (Signed)
Primary cardiologist: Dr. Virl Axe  Seen for followup: AF, cardiomyopathy  Subjective:    No change in breathing status, comfortable at rest. No chest pain or sense of palpitations.  Objective:   Temp:  [97.8 F (36.6 C)-98 F (36.7 C)] 97.8 F (36.6 C) (12/11 0511) Pulse Rate:  [105-124] 105 (12/11 0511) Resp:  [17-18] 18 (12/11 0511) BP: (97-103)/(50-60) 103/50 mmHg (12/11 0511) SpO2:  [94 %-97 %] 94 % (12/11 0511) Weight:  [165 lb 6.4 oz (75.025 kg)] 165 lb 6.4 oz (75.025 kg) (12/11 0511) Last BM Date: 11/07/15  Filed Weights   11/06/15 0544 11/07/15 0406 11/08/15 0511  Weight: 175 lb 14.4 oz (79.788 kg) 166 lb 1.6 oz (75.342 kg) 165 lb 6.4 oz (75.025 kg)    Intake/Output Summary (Last 24 hours) at 11/08/15 [id number] Last data filed at 11/08/15 0511  Gross per 24 hour  Intake    603 ml  Output   1300 ml  Net   -697 ml    Telemetry: Atrial fibrillation.  Exam:  General: Elderly male, appears comfortable at rest.  HEENT: Conjunctiva and lids normal, all Cecille Rubin clear.  Lungs: Decreased breath sounds at the right base. No wheezing or labored breathing.  Cardiac: Irregularly irregular with soft apical systolic murmur. No elevated JVP.  Abdomen: NABS.  Extremities: No pitting edema.  Lab Results:  Basic Metabolic Panel:  Recent Labs Lab 11/04/15 1312  11/06/15 0233 11/07/15 0228 11/08/15 0257  NA 135  < > 134* 135 134*  K 4.5  < > 3.7 3.9 3.9  CL 104  < > 103 99* 96*  CO2 26  < > 27 28 28   GLUCOSE 115*  < > 103* 142* 135*  BUN 18  < > 17 19 23*  CREATININE 1.16  < > 1.15 1.41* 1.45*  CALCIUM 8.7*  < > 8.3* 8.4* 8.3*  MG 2.0  --   --   --   --   < > = values in this interval not displayed.  Liver Function Tests:  Recent Labs Lab 11/06/15 0233 11/07/15 0228 11/08/15 0257  AST 25 27 23   ALT 17 19 16*  ALKPHOS 115 122 118  BILITOT 0.6 1.1 1.0  PROT 5.6* 5.7* 5.5*  ALBUMIN 2.7* 2.7* 2.5*    CBC:  Recent Labs Lab 11/06/15 0233  11/08/15 0040 11/08/15 0257  WBC 6.9 8.1 8.3  HGB 11.9* 13.1 13.1  HCT 35.3* 38.3* 38.3*  MCV 95.9 94.6 95.0  PLT 144* 159 152    Cardiac Enzymes:  Recent Labs Lab 11/04/15 1300  CKTOTAL 42  CKMB 2.5   Echocardiogram 11/05/2015: Study Conclusions  - Left ventricle: The cavity size was normal. Systolic function was severely reduced. The estimated ejection fraction was in the range of 25% to 30%. Diffuse hypokinesis. - Aortic valve: Trileaflet; mildly thickened, mildly calcified leaflets. Cusp separation was reduced. - Aorta: The aorta was moderately calcified. - Mitral valve: Moderately calcified annulus. There was mild regurgitation. - Left atrium: The atrium was moderately dilated. - Right ventricle: The cavity size was moderately dilated. Wall thickness was normal. Systolic function was moderately reduced. - Right atrium: The atrium was severely dilated. - Tricuspid valve: There was moderate regurgitation. - Pulmonic valve: There was moderate regurgitation.  Impressions:  - EF is reduced when compared to prior study (50%).   Medications:   Scheduled Medications: . azelastine  2 spray Each Nare BID  . carvedilol  3.125 mg Oral BID WC  .  cetaphil   Topical Daily  . digoxin  0.125 mg Oral Daily  . finasteride  5 mg Oral Daily  . furosemide  40 mg Intravenous Daily  . insulin aspart  0-9 Units Subcutaneous TID WC  . insulin aspart  3 Units Subcutaneous TID WC  . [START ON 11/09/2015] levofloxacin (LEVAQUIN) IV  750 mg Intravenous Q48H  . lipase/protease/amylase  12,000 Units Oral TID AC  . multivitamin with minerals  1 tablet Oral Daily  . saccharomyces boulardii  250 mg Oral Daily  . sodium chloride  1 spray Each Nare BID  . sodium chloride  3 mL Intravenous Q12H  . tamsulosin  0.4 mg Oral BID     PRN Medications: sodium chloride, acetaminophen, diltiazem, magnesium hydroxide, ondansetron (ZOFRAN) IV, sodium chloride   Assessment:   1.  Persistent atrial fibrillation, failed Tikosyn and amiodarone. Recent visit with Dr. Caryl Comes was in October at which time strategy of heart rate control and anticoagulation was pursued. Currently Eliquis has been held in anticipation of diagnostic cardiac catheterization on Monday as per Dr. Debara Pickett.  2. Acute systolic heart failure, LVEF 25-30%, decreased from 50% in 2013. Status post thoracentesis of moderate right sided pleural effusion, and is diuresing on IV Lasix.  3. Coronary artery calcifications noted by chest CT imaging. No prior history of obstructive CAD or myocardial infarction. Troponin I level 0.02 during this hospital stay.  4. History of hypothyroidism, recent TSH 6.8.  5. Essential hypertension, blood pressures have been low and limiting medical therapy to some degree.  6. Acute renal insufficiency, creatinine has bumped up to 1.4.   Plan/Discussion:    Patient scheduled for left and right heart catheterization tomorrow as per Dr. Debara Pickett. He has been taken off Eliquis for now. Plan to stop Lasix today with bump in creatinine. Low blood pressure limits further up titration of Coreg, Lanoxin was added to assist with rate control. Hold off on adding ACE inhibitor or ARB as well with low blood pressure and allergy to lisinopril. BMET in AM.   Satira Sark, M.D., F.A.C.C.

## 2015-11-09 NOTE — Discharge Summary (Addendum)
Physician Discharge Summary  JOSHUE BADAL MRN: [id number] DOB/AGE: Apr 24, 1925 81 y.o.  PCP: [name], MD   Admit date: 11/04/2015 Discharge date: 11/09/2015  Discharge Diagnoses:     Principal Problem:   Pleural effusion Active Problems:   Type II diabetes mellitus with manifestations (HCC)   Persistent atrial fibrillation (HCC)   CAD (coronary artery disease)   Hypothyroid   Acute on chronic heart failure (HCC)   S/P thoracentesis   SOB (shortness of breath)   CAP (community acquired pneumonia)    Follow-up recommendations Follow-up with PCP in 3-5 days , including all  additional recommended appointments as below Follow-up CBC, CMP in 3-5 days Follow-up with cardiology in 1-2 weeks Follow-up chest x-ray in 1-2 weeks for right pleural effusion     Medication List    STOP taking these medications        LEVEMIR FLEXTOUCH 100 UNIT/ML Pen  Generic drug:  Insulin Detemir      TAKE these medications        AMLACTIN XL Lotn  Apply 1 application topically daily. Apply lotion to legs     apixaban 2.5 MG Tabs tablet  Commonly known as:  ELIQUIS  Take 1 tablet (2.5 mg total) by mouth 2 (two) times daily.     azelastine 0.1 % nasal spray  Commonly known as:  ASTELIN  Place 2 sprays into both nostrils 2 (two) times daily. Use in each nostril as directed     carvedilol 3.125 MG tablet  Commonly known as:  COREG  Take 1 tablet (3.125 mg total) by mouth 2 (two) times daily with a meal.     CENTRUM SILVER PO  Take 1 tablet by mouth daily.     [employer] GLUCOSAMINE-CHONDROITIN PO  Take 1 tablet by mouth daily. 1200/600 mg     digoxin 0.125 MG tablet  Commonly known as:  LANOXIN  Take 1 tablet (0.125 mg total) by mouth daily.     finasteride 5 MG tablet  Commonly known as:  PROSCAR  Take 1 tablet (5 mg total) by mouth daily.     furosemide 20 MG tablet  Commonly known as:  LASIX  Take 1 tablet (20 mg total) by mouth every other day.     Insulin Pen Needle  31G X 5 MM Misc  Commonly known as:  B-D UF III MINI PEN NEEDLES  Inject 120 Devices into the skin 4 (four) times daily. Use a new needle for injecting insulin4 times a day     lipase/protease/amylase 12000 UNITS Cpep capsule  Commonly known as:  CREON  Take 1 capsule (12,000 Units total) by mouth 3 (three) times daily before meals.     NOVOLOG FLEXPEN 100 UNIT/ML FlexPen  Generic drug:  insulin aspart  Inject 4-8 Units into the skin 3 (three) times daily with meals. Inject 5 units subcutaneously with breakfast, 3 units with lunch and 5 units with supper     PROBIOTIC DAILY PO  Take 3 tablets by mouth daily. 3 tabs once a day with meals.     SALINE NA  Place 1 spray into both nostrils 2 (two) times daily.     tamsulosin 0.4 MG Caps capsule  Commonly known as:  FLOMAX  Take 1 capsule (0.4 mg total) by mouth 2 (two) times daily.     vitamin C 500 MG tablet  Commonly known as:  ASCORBIC ACID  Take 500 mg by mouth daily.         Discharge  Condition: Stable   Discharge Instructions       Discharge Instructions    Diet - low sodium heart healthy    Complete by:  As directed      Increase activity slowly    Complete by:  As directed                Disposition: 01-Home or Self Care   Consults:  Cardiology  Significant Diagnostic Studies:  Dg Chest 1 View  11/06/2015  CLINICAL DATA:  Post right thoracentesis EXAM: CHEST 1 VIEW COMPARISON:  11/05/2015 FINDINGS: Cardiomediastinal silhouette is stable. The right pleural effusion has resolved. Mild bilateral basilar atelectasis left greater than right. Right hilar catheter is stable in position. No segmental infiltrate or pulmonary edema. No pneumothorax. IMPRESSION: The right pleural effusion has resolved. Mild bilateral basilar atelectasis left greater than right. Right hilar catheter is stable in position. No segmental infiltrate or pulmonary edema. No pneumothorax. Electronically Signed   By: Lahoma Crocker M.D.   On:  11/06/2015 17:13   Dg Chest 1 View  11/04/2015  CLINICAL DATA:  Initial encounter for status post right thoracentesis. EXAM: CHEST 1 VIEW COMPARISON:  11/04/2015, earlier the same day. FINDINGS: Interval decrease in right pleural effusion no evidence for pneumothorax. Stable appearance of catheter tubing over the medial right hemi thorax and heart silhouette. Left lung remains clear. The cardio pericardial silhouette is enlarged. Telemetry leads overlie the chest. IMPRESSION: No evidence for pneumothorax status post thoracentesis. Electronically Signed   By: Misty Stanley M.D.   On: 11/04/2015 16:56   Dg Chest 2 View  11/05/2015  CLINICAL DATA:  History of recent thoracentesis forl reduction of right pleural effusion, cough and congestion EXAM: CHEST  2 VIEW COMPARISON:  Portable chest x-ray of 11/04/2015 FINDINGS: After right thoracentesis, there is little change in the small right pleural effusion remaining with right basilar atelectasis. The left lung is clear. Cardiomegaly is stable. No pneumothorax is seen. IMPRESSION: No significant change after right thoracentesis in the volume of the right pleural effusion with right basilar atelectasis. Electronically Signed   By: Ivar Drape M.D.   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 EXAM: CHEST  2 VIEW COMPARISON:  PA and lateral chest x-ray of June 22, 2013 FINDINGS: There is a new moderate-sized right pleural effusion occupying 1/2 of the right hemithorax. Right basilar atelectasis or pneumonia may be obscured. There is small amount of atelectasis or infiltrate at the left lung base medially. A trace of pleural fluid on the left is suspected. The cardiac silhouette is mildly enlarged. The pulmonary vascularity is normal. The bony thorax exhibits no acute abnormality. IMPRESSION: Since  the previous study the patient has developed a moderate sized right pleural effusion. This may be obscuring right basilar atelectasis, pneumonia, or mass. There is a trace left pleural effusion and minimal left basilar atelectasis or infiltrate. There is no evidence of CHF. Chest CT scanning is recommended. Electronically Signed   By: David  Martinique M.D.   On: 11/04/2015 11:30   Ct Angio Chest Pe W/cm &/or Wo Cm  11/05/2015  CLINICAL DATA:  Shortness of breath, pleural effusion. 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  Multiplanar CT image reconstructions and MIPs were obtained to evaluate the vascular anatomy. CONTRAST:  4m OMNIPAQUE IOHEXOL 350 MG/ML SOLN COMPARISON:  CT abdomen 12/16/ 15 images of the lung bases FINDINGS: Sagittal images of the spine shows osteopenia and degenerative changes thoracic spine. Sagittal view of the sternum is unremarkable. Atherosclerotic calcifications of thoracic aorta and coronary arteries. The study is of excellent technical quality. No pulmonary embolus is noted. Bilateral perihilar peribronchial thickening is noted. There is no mediastinal hematoma or adenopathy. Images of the upper abdomen shows partially visualized postsurgical changes post Whipple procedure. There is moderate size right pleural effusion. Small left pleural effusion. There is atelectasis in left lower lobe posteriorly. There is patchy airspace disease/infiltrate in right middle lobe and right lower lobe. Findings are highly suspicious for pneumonia. There is a catheter fragment in main pulmonary artery extending from just above the origin in right main pulmonary artery and a small branch. The catheter measures at least 12.5 cm in length. This was only partially visualized on prior exam stable in position from prior  exam. Review of the MIP images confirms the above findings. IMPRESSION: 1. No pulmonary embolus. 2. There is moderate size right pleural effusion. Small left pleural effusion. Atelectasis noted in left lower lobe posteriorly. There is consolidation with air bronchogram in right lower lobe and right middle lobe highly suspicious for infiltrate/pneumonia. 3. Mild perihilar bronchitic changes. 4. No mediastinal hematoma or adenopathy. 5. There is a catheter fragment in main pulmonary artery extending from just above the origin in right main pulmonary artery and a small branch. The catheter measures at least 12.5 cm in length. This was only partially visualized on prior exam stable in position from prior exam. These results were called by telephone at the time of interpretation on 11/05/2015 at 1:57 pm to Dr. NReyne Dumas, who verbally acknowledged these results. Electronically Signed   By: LLahoma CrockerM.D.   On: 11/05/2015 14:02   Dg Swallowing Func-speech Pathology  11/06/2015  Objective Swallowing Evaluation:   Patient Details Name: LTREVON STROTHERSMRN: [id number] of Birth: [birth year]Today's Date: 11/06/2015 Time: SLP Start Time (ACUTE ONLY): 1009-SLP Stop Time (ACUTE ONLY): 1020 SLP Time Calculation (min) (ACUTE ONLY): 11 min Past Medical History: Past Medical History Diagnosis Date . Acid reflux disease    history of . H/O: GI bleed  . Paroxysmal atrial fibrillation (HCC)    chronic anticoag; tikosyn . Benign prostatic hypertrophy    history of . Hepatic damage march 2009   retained hepatic stone , intrahepatic duct catheter . CAD (coronary artery disease)  . Hypertension  . 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 [place]   [place] triceps s/p resection, XRT and chemo . [place]  . Ventricular tachycardia ? Tikosyn proarrhtyhmia  . Cataracts, bilateral  . Diabetes mellitus    insulin dep . Pleural effusion 10/2015 .  Shortness of breath dyspnea  Past Surgical History: Past Surgical History Procedure Laterality Date . Cardioversion   . Cholecystectomy     history of . Partial gastrectomy     history of . Shoulder surgery     left shoulder repair with 2 pens inserted . Whipple procedure     operation . 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 Inguinal hernia repair     inguinal herniorrhaphy, right... history of . Appendectomy     history of . Cardioversion  03/07/2012  Procedure: CARDIOVERSION;  Surgeon: Deboraha Sprang, MD;  Location: [place];  Service: Cardiovascular;  Laterality: N/A; . Cardioversion N/A 08/10/2015   Procedure: CARDIOVERSION;  Surgeon: Thayer Headings, MD;  Location: [facility] ENDOSCOPY;  Service: Cardiovascular;  Laterality: N/A; HPI: Arvle Grabe is a 81 y.o. male, with permanent atrial fibrillation on Eliquis, diabetes mellitus, seizures, and history of Whipple procedure who presents to the emergency department with shortness of breath. Mr. Dains saw his primary care physician for cough and shortness of breath and was sent to the emergency department after an x-ray showed a large right pleural effusion. Mr. Granzow reports that he has been feeling fatigued and badly for about the past month. But over the last week he has become much worse. He is no longer able to walk his daily 1/2 mile. He is unable to sleep. He has a dry cough but occasionally brings up frothy sputum. He also coughs when he eats or drinks liquids,  No Data Recorded Assessment / Plan / Recommendation CHL IP CLINICAL IMPRESSIONS 11/06/2015 Therapy Diagnosis Moderate pharyngeal phase dysphagia Clinical Impression Pt presents with moderate pharyngeal phase dysphagia characterized by one incidence of flash penetration with larger boluses of liquids, but clearance noted without strategies utilized; pt also exhibited moderate vallecular residue with puree-solid consistencies which required multiple repetitive swallows, effortful swallows and alternating  solids/liquids to clear valleculae which place him at a moderate aspiration risk without use of strategies during po intake; decreased epiglottic inversion noted intermittently during study as well which also increases his aspiration risk.  Pt did not aspirate any consistency, but is certainly at risk if strategies are not utilized during po intake; ST to f/u during hospitalization for diet tolerance/pt education re: aspiration risk/swallowing strategy use and should be f/u with SLP at SNF. Impact on safety and function Moderate aspiration risk   CHL IP TREATMENT RECOMMENDATION 11/06/2015 Treatment Recommendations Therapy as outlined in treatment plan below   Prognosis 11/06/2015 Prognosis for Safe Diet Advancement Good Barriers to Reach Goals -- Barriers/Prognosis Comment -- CHL IP DIET RECOMMENDATION 11/06/2015 SLP Diet Recommendations Regular solids;Thin liquid Liquid Administration via Cup Medication Administration Whole meds with liquid Compensations Slow rate;Small sips/bites;Clear throat intermittently;Effortful swallow Postural Changes Seated upright at 90 degrees   CHL IP OTHER RECOMMENDATIONS 11/06/2015 Recommended Consults -- Oral Care Recommendations Oral care BID Other Recommendations --   CHL IP FOLLOW UP RECOMMENDATIONS 11/05/2015 Follow up Recommendations ST f/u at SNF upon d/c   CHL IP FREQUENCY AND DURATION 11/06/2015 Speech Therapy Frequency (ACUTE ONLY) min 2x/week Treatment Duration 1 week      CHL IP ORAL PHASE 11/06/2015 Oral Phase WFL Oral - Pudding Teaspoon -- Oral - Pudding Cup -- Oral - Honey Teaspoon -- Oral - Honey Cup -- Oral - Nectar Teaspoon -- Oral - Nectar Cup -- Oral - Nectar Straw -- Oral - Thin Teaspoon -- Oral - Thin Cup -- Oral - Thin Straw -- Oral - Puree -- Oral - Mech Soft -- Oral - Regular -- Oral - Multi-Consistency -- Oral - Pill -- Oral Phase - Comment --  CHL IP PHARYNGEAL PHASE 11/06/2015 Pharyngeal Phase Impaired Pharyngeal- Pudding Teaspoon -- Pharyngeal -- Pharyngeal-  Pudding Cup -- Pharyngeal -- Pharyngeal- Honey Teaspoon -- Pharyngeal -- Pharyngeal- Honey Cup -- Pharyngeal -- Pharyngeal- Nectar Teaspoon -- Pharyngeal -- Pharyngeal- Nectar Cup -- Pharyngeal -- Pharyngeal- Nectar Straw -- Pharyngeal -- Pharyngeal- Thin Teaspoon -- Pharyngeal -- Pharyngeal- Thin Cup Penetration/Aspiration during swallow;Other (Comment) Pharyngeal (No Data) Pharyngeal- Thin Straw --  Pharyngeal -- Pharyngeal- Puree Reduced epiglottic inversion;Pharyngeal residue - valleculae;Compensatory strategies attempted (with notebox) Pharyngeal -- Pharyngeal- Mechanical Soft -- Pharyngeal -- Pharyngeal- Regular Reduced epiglottic inversion;Pharyngeal residue - valleculae;Compensatory strategies attempted (with notebox) Pharyngeal -- Pharyngeal- Multi-consistency -- Pharyngeal -- Pharyngeal- Pill -- Pharyngeal -- Pharyngeal Comment --  CHL IP CERVICAL ESOPHAGEAL PHASE 11/06/2015 Cervical Esophageal Phase WFL Pudding Teaspoon -- Pudding Cup -- Honey Teaspoon -- Honey Cup -- Nectar Teaspoon -- Nectar Cup -- Nectar Straw -- Thin Teaspoon -- Thin Cup -- Thin Straw -- Puree -- Mechanical Soft -- Regular -- Multi-consistency -- Pill -- Cervical Esophageal Comment -- ADAMS,PAT, M.S., CCC-SLP 11/06/2015, 10:31 AM              US Thoracentesis Asp Pleural Space W/img Guide  11/06/2015  Ardis Rowan, PA-C     11/06/2015  4:37 PM Successful US guided right thoracentesis. Yielded 1.6 liters of clear yellow fluid. Pt tolerated procedure well. No immediate complications. Specimen was not sent for labs. CXR ordered. Abigail Butts S BLAIR PA-C 11/06/2015 4:36 PM   US Thoracentesis Asp Pleural Space W/img Guide  11/04/2015  INDICATION: Symptomatic right sided pleural effusion EXAM: US THORACENTESIS ASP PLEURAL SPACE W/IMG GUIDE COMPARISON:  CXR 11/04/2015. MEDICATIONS: None COMPLICATIONS: None immediate TECHNIQUE: Informed written consent was obtained from the patient after a discussion of the risks, benefits and alternatives  to treatment. A timeout was performed prior to the initiation of the procedure. Initial ultrasound scanning demonstrates a right pleural effusion. The lower chest was prepped and draped in the usual sterile fashion. 1% lidocaine was used for local anesthesia. An ultrasound image was saved for documentation purposes. A 6 Fr Safe-T-Centesis catheter was introduced. The thoracentesis was performed. The catheter was removed and a dressing was applied. The patient tolerated the procedure well without immediate post procedural complication. The patient was escorted to have an upright chest radiograph. FINDINGS: A total of approximately 1.4 Liters of yellow fluid was removed. Requested samples were sent to the laboratory. IMPRESSION: Successful ultrasound-guided right sided thoracentesis yielding 1.4 liters of pleural fluid. Read By:  Tsosie Billing PA-C Electronically Signed   By: Lucrezia Europe M.D.   On: 11/04/2015 16:31      2-D echo LV EF: 25% -  30% Impressions:  - EF is reduced when compared to prior study (50%).  Cardiac catheterization Assessment:  1. Mild non-obstructive CAD 2. Nonischemic CM with EF 25% by echo 3. Chronic AF 4. Well-compensated hemodynamics with low filling pressures  Plan/Discussion:  Medical therapy. Restart Eliquis this evening   Filed Weights   11/07/15 0406 11/08/15 0511 11/09/15 0450  Weight: 75.342 kg (166 lb 1.6 oz) 75.025 kg (165 lb 6.4 oz) 75.342 kg (166 lb 1.6 oz)     Microbiology: Recent Results (from the past 240 hour(s))  Culture, body fluid-bottle     Status: None (Preliminary result)   Collection Time: 11/04/15  4:18 PM  Result Value Ref Range Status   Specimen Description FLUID PLEURAL RIGHT  Final   Special Requests BOTTLES DRAWN AEROBIC AND ANAEROBIC 10CC  Final   Culture NO GROWTH 4 DAYS  Final   Report Status PENDING  Incomplete  Gram stain     Status: None   Collection Time: 11/04/15  4:18 PM  Result Value Ref Range Status   Specimen  Description FLUID PLEURAL RIGHT  Final   Special Requests NONE  Final   Gram Stain   Final    WBC PRESENT, PREDOMINANTLY MONONUCLEAR NO ORGANISMS SEEN CYTOSPIN  Report Status 11/04/2015 FINAL  Final  MRSA PCR Screening     Status: None   Collection Time: 11/05/15  8:19 PM  Result Value Ref Range Status   MRSA by PCR NEGATIVE NEGATIVE Final    Comment:        The GeneXpert MRSA Assay (FDA approved for NASAL specimens only), is one component of a comprehensive MRSA colonization surveillance program. It is not intended to diagnose MRSA infection nor to guide or monitor treatment for MRSA infections.        Blood Culture    Component Value Date/Time   SDES FLUID PLEURAL RIGHT 11/04/2015 1618   SDES FLUID PLEURAL RIGHT 11/04/2015 1618   SPECREQUEST BOTTLES DRAWN AEROBIC AND ANAEROBIC 10CC 11/04/2015 1618   SPECREQUEST NONE 11/04/2015 1618   CULT NO GROWTH 4 DAYS 11/04/2015 1618   REPTSTATUS PENDING 11/04/2015 1618   REPTSTATUS 11/04/2015 FINAL 11/04/2015 1618      Labs: Results for orders placed or performed during the hospital encounter of 11/04/15 (from the past 48 hour(s))  Glucose, capillary     Status: Abnormal   Collection Time: 11/07/15 11:54 AM  Result Value Ref Range   Glucose-Capillary 119 (H) 65 - 99 mg/dL  Glucose, capillary     Status: Abnormal   Collection Time: 11/07/15  4:34 PM  Result Value Ref Range   Glucose-Capillary 153 (H) 65 - 99 mg/dL   Comment 1 Notify RN    Comment 2 Document in Chart   Glucose, capillary     Status: None   Collection Time: 11/07/15  9:34 PM  Result Value Ref Range   Glucose-Capillary 79 65 - 99 mg/dL  CBC     Status: Abnormal   Collection Time: 11/08/15 12:40 AM  Result Value Ref Range   WBC 8.1 4.0 - 10.5 K/uL   RBC 4.05 (L) 4.22 - 5.81 MIL/uL   Hemoglobin 13.1 13.0 - 17.0 g/dL   HCT 38.3 (L) 39.0 - 52.0 %   MCV 94.6 78.0 - 100.0 fL   MCH 32.3 26.0 - 34.0 pg   MCHC 34.2 30.0 - 36.0 g/dL   RDW 13.5 11.5 - 15.5  %   Platelets 159 150 - 400 K/uL  Comprehensive metabolic panel     Status: Abnormal   Collection Time: 11/08/15  2:57 AM  Result Value Ref Range   Sodium 134 (L) 135 - 145 mmol/L   Potassium 3.9 3.5 - 5.1 mmol/L   Chloride 96 (L) 101 - 111 mmol/L   CO2 28 22 - 32 mmol/L   Glucose, Bld 135 (H) 65 - 99 mg/dL   BUN 23 (H) 6 - 20 mg/dL   Creatinine, Ser 1.45 (H) 0.61 - 1.24 mg/dL   Calcium 8.3 (L) 8.9 - 10.3 mg/dL   Total Protein 5.5 (L) 6.5 - 8.1 g/dL   Albumin 2.5 (L) 3.5 - 5.0 g/dL   AST 23 15 - 41 U/L   ALT 16 (L) 17 - 63 U/L   Alkaline Phosphatase 118 38 - 126 U/L   Total Bilirubin 1.0 0.3 - 1.2 mg/dL   GFR calc non Af Amer 41 (L) >60 mL/min   GFR calc Af Amer 48 (L) >60 mL/min    Comment: (NOTE) The eGFR has been calculated using the CKD EPI equation. This calculation has not been validated in all clinical situations. eGFR's persistently <60 mL/min signify possible Chronic Kidney Disease.    Anion gap 10 5 - 15  CBC     Status:  Abnormal   Collection Time: 11/08/15  2:57 AM  Result Value Ref Range   WBC 8.3 4.0 - 10.5 K/uL   RBC 4.03 (L) 4.22 - 5.81 MIL/uL   Hemoglobin 13.1 13.0 - 17.0 g/dL   HCT 38.3 (L) 39.0 - 52.0 %   MCV 95.0 78.0 - 100.0 fL   MCH 32.5 26.0 - 34.0 pg   MCHC 34.2 30.0 - 36.0 g/dL   RDW 13.5 11.5 - 15.5 %   Platelets 152 150 - 400 K/uL  Glucose, capillary     Status: Abnormal   Collection Time: 11/08/15  6:06 AM  Result Value Ref Range   Glucose-Capillary 125 (H) 65 - 99 mg/dL  Glucose, capillary     Status: Abnormal   Collection Time: 11/08/15 11:29 AM  Result Value Ref Range   Glucose-Capillary 161 (H) 65 - 99 mg/dL   Comment 1 Notify RN    Comment 2 Document in Chart   Glucose, capillary     Status: Abnormal   Collection Time: 11/08/15  4:34 PM  Result Value Ref Range   Glucose-Capillary 111 (H) 65 - 99 mg/dL   Comment 1 Notify RN    Comment 2 Document in Chart   Glucose, capillary     Status: Abnormal   Collection Time: 11/08/15  9:03  PM  Result Value Ref Range   Glucose-Capillary 150 (H) 65 - 99 mg/dL  CBC     Status: Abnormal   Collection Time: 11/09/15  2:45 AM  Result Value Ref Range   WBC 7.9 4.0 - 10.5 K/uL   RBC 4.22 4.22 - 5.81 MIL/uL   Hemoglobin 13.3 13.0 - 17.0 g/dL   HCT 40.3 39.0 - 52.0 %   MCV 95.5 78.0 - 100.0 fL   MCH 31.5 26.0 - 34.0 pg   MCHC 33.0 30.0 - 36.0 g/dL   RDW 13.4 11.5 - 15.5 %   Platelets 148 (L) 150 - 400 K/uL  Comprehensive metabolic panel     Status: Abnormal   Collection Time: 11/09/15  2:45 AM  Result Value Ref Range   Sodium 133 (L) 135 - 145 mmol/L   Potassium 3.8 3.5 - 5.1 mmol/L   Chloride 97 (L) 101 - 111 mmol/L   CO2 29 22 - 32 mmol/L   Glucose, Bld 134 (H) 65 - 99 mg/dL   BUN 26 (H) 6 - 20 mg/dL   Creatinine, Ser 1.26 (H) 0.61 - 1.24 mg/dL   Calcium 8.3 (L) 8.9 - 10.3 mg/dL   Total Protein 5.8 (L) 6.5 - 8.1 g/dL   Albumin 2.5 (L) 3.5 - 5.0 g/dL   AST 25 15 - 41 U/L   ALT 18 17 - 63 U/L   Alkaline Phosphatase 127 (H) 38 - 126 U/L   Total Bilirubin 0.9 0.3 - 1.2 mg/dL   GFR calc non Af Amer 49 (L) >60 mL/min   GFR calc Af Amer 57 (L) >60 mL/min    Comment: (NOTE) The eGFR has been calculated using the CKD EPI equation. This calculation has not been validated in all clinical situations. eGFR's persistently <60 mL/min signify possible Chronic Kidney Disease.    Anion gap 7 5 - 15  Protime-INR     Status: Abnormal   Collection Time: 11/09/15  2:45 AM  Result Value Ref Range   Prothrombin Time 16.6 (H) 11.6 - 15.2 seconds   INR 1.32 0.00 - 1.49  Glucose, capillary     Status: Abnormal   Collection Time:  11/09/15  6:43 AM  Result Value Ref Range   Glucose-Capillary 161 (H) 65 - 99 mg/dL     Lipid Panel     Component Value Date/Time   CHOL 114 10/21/2015 1049   TRIG 59.0 10/21/2015 1049   TRIG 57 12/05/2006 1459   HDL 46.20 10/21/2015 1049   CHOLHDL 2 10/21/2015 1049   CHOLHDL 3.3 CALC 12/05/2006 1459   VLDL 11.8 10/21/2015 1049   LDLCALC 56  10/21/2015 1049     Lab Results  Component Value Date   HGBA1C 6.5 10/21/2015   HGBA1C 6.0 06/24/2015   HGBA1C 6.3 02/25/2015     Lab Results  Component Value Date   MICROALBUR <0.7 02/25/2015   LDLCALC 56 10/21/2015   CREATININE 1.26* 11/09/2015     81 y.o. male, with permanent atrial fibrillation on Eliquis, diabetes mellitus, seizures, and history of Whipple procedure who presents to the emergency department with shortness of breath. Mr. Garrelts saw his primary care physician this morning for cough and shortness of breath and was sent to the emergency department after an x-ray showed a large right pleural effusion. Mr. Evrard reports that he has been feeling fatigued and badly for about the past month. But over the last week he has become much worse. He is no longer able to walk his daily 1/2 mile. He is unable to sleep. He has a dry cough but occasionally brings up frothy sputum. He also coughs when he eats or drinks liquids, +orthopnea, +PND, +insomnia. The patient reports he was taken off of amiodarone approximately 3 mos ago, and was told by his cardiologist, Dr. Caryl Comes, that his afib was going to be permanent. Patient found to have persistent atrial fibrillation and acute on chronic systolic heart failure, status post thoracentesis on the right side 2, cardiac cath during this admission.   Assessment and plan Shortness of breath likely secondary to Acute systolic heart failure, LVEF 25-30%, decreased from 50% in 2013.  Large Right-sided pleural effusion Appears to be transudative in nature, await cytology results , holding Lasix in anticipation of cardiac cath tomorrow, blood pressure soft  S/p thoracentesis twice this admission. They removed 1.4 liters fluid on 12/7, repeat thoracentesis on 12/9, 1.6 L of yellow fluid removed CT chest shows a moderate-sized right pleural effusion, questionable right lower lobe pneumonia, started on Levaquin Chronic fragment from a catheter  measuring 12.5 cm which was present on prior CT Speech therapy consultation to rule out aspiration, started on regular diet and thin liquid Empirically treated with levofloxacin continue for another 5 days, although I doubt that the patient has a pneumonia clinically no fever no cough no white count Venous doppler No evidence of DVT, superficial thrombosis, or [name]'s Cyst    Acute on chronic systolic heart failure Patient appears in mild volume overload. + JVD, +1 bilateral lower extremity edema. Repeat 2-D echocardiogram shows EF of 25-30%. , Previously 50%, cardiology consulted  Continue Coreg. No ACE inhibitor as blood pressure soft.  Continue lasix at 20 mg /day Low blood pressure limits further up titration of Coreg, Coreg reduced to 3.125 mg BID, cardiology also added Lanoxin to assist with rate control. Hold off on adding ACE inhibitor or ARB as well with low blood pressure and allergy to lisinopril.  Follow BMP   Coronary artery calcifications noted by chest CT imaging. No prior history of obstructive CAD or myocardial infarction. Troponin I level 0.02 during this hospital stay. Cardiac cath done 12/12, normal coronaries .resume Eliquis  Persistent atrial fibrillation, uncontrolled despite IV digoxin, IV Cardizem failed Tikosyn and amiodarone. Recent visit with Dr. Caryl Comes was in October at which time strategy of heart rate control and anticoagulation was pursued.   Eliquis was held in anticipation of diagnostic cardiac catheterization on Monday as per Dr. Debara Pickett, resumed prior to discharge Lanoxin was added to assist with rate control, patient also received 0.5 mg IV digoxin 12/10   Type 2 diabetesCBG stable after discontinuation of Levemir, Levemir discontinued due to hypoglycemia.  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 Continue Creon supplementation.  Hypothyroidism Not currently on thyroid hormone supplementation? TSH 6.85. Free  T4 within normal limits    DVT prophylaxsis eliquis    Discharge Exam:    Blood pressure 102/59, pulse 98, temperature 97.4 F (36.3 C), temperature source Oral, resp. rate 23, height 6' 2"  (1.88 m), weight 75.342 kg (166 lb 1.6 oz), SpO2 95 %.  General: Elderly male, appears comfortable at rest.  HEENT: Conjunctiva and lids normal, all Cecille Rubin clear.  Lungs: Decreased breath sounds at the right base. No wheezing or labored breathing.  Cardiac: Irregularly irregular with soft apical systolic murmur. No elevated JVP.  Abdomen: NABS.  Extremities: No pitting edema.    Follow-up Information    Follow up with [name], MD. Schedule an appointment as soon as possible for a visit in 3 days.   Specialty:  Internal Medicine   Contact information:   [address] [address] [phone]       Follow up with Virl Axe, MD. Schedule an appointment as soon as possible for a visit in 1 week.   Specialty:  Cardiology   Contact information:   [address] [place] [address] [phone]       Signed: Reyne Dumas 11/09/2015, 10:17 AM        Time spent >45 mins

## 2015-11-09 NOTE — Progress Notes (Signed)
Site area: right groin fa and fv sheaths Site Prior to Removal:  Level  0 Pressure Applied For: 20 minutes Manual:   yes Patient Status During Pull:  stable Post Pull Site:  Level  0 Post Pull Instructions Given:  yes Post Pull Pulses Present: yes Dressing Applied:  tegaderm Bedrest begins @  1000 Comments:

## 2015-11-09 NOTE — Care Management Important Message (Signed)
Important Message  Patient Details  Name: [name] MRN: [id number] Date of Birth: 1924-12-17   Medicare Important Message Given:  Yes    Maryclare Labrador, RN 11/09/2015, 10:51 AM

## 2015-11-09 NOTE — Progress Notes (Signed)
SLP Cancellation Note  Patient Details Name: SIDY CURTNER MRN: [id number] DOB: 11-30-1924   Cancelled treatment:       Reason Eval/Treat Not Completed: Patient at procedure or test/unavailable. NPO for procedure   Ifeanyi Mickelson, Katherene Ponto 11/09/2015, 7:33 AM

## 2015-11-09 NOTE — Care Management Note (Addendum)
Case Management Note  Patient Details  Name: [name] MRN: [id number] Date of Birth: 01-09-1925  Subjective/Objective:    Pt admitted with plueral effusions, underwent thoracentesis yesterday 11/04/15                Action/Plan:  Pt is independent with wife from [employer].  Pt stated he had a scale and will begin using it daily, pt stated he adheres to low sodium diet.  Pt denies difficulty with ambulating or performing ADLS.  CM will continue to monitor for disposition needs   Expected Discharge Date:                  Expected Discharge Plan:  Home/Self Care (Pt is from the Independent portion of [place] with wife)  In-House Referral:     Discharge planning Services     Post Acute Care Choice:    Choice offered to:     DME Arranged:    DME Agency:     HH Arranged:    [place] Agency:     Status of Service:  Complete, will sign off  Medicare Important Message Given:    Date Medicare IM Given:    Medicare IM give by:    Date Additional Medicare IM Given:    Additional Medicare Important Message give by:     If discussed at [place] of Stay Meetings, dates discussed:    Additional Comments: CM assessed pt prior to discharge, no CM needs determined  Maryclare Labrador, RN 11/09/2015, 10:49 AM

## 2015-11-09 NOTE — Progress Notes (Signed)
Primary cardiologist: Dr. Virl Axe  Seen for followup: AF, cardiomyopathy  Subjective:    No change in breathing status, comfortable at rest. No chest pain or sense of palpitations. Pt has had his cath . Nonobstructive CAD Low right sided filling pressures.    Objective:   Temp:  [97.4 F (36.3 C)-98 F (36.7 C)] 97.4 F (36.3 C) (12/12 0450) Pulse Rate:  [59-110] 105 (12/12 1400) Resp:  [10-25] 23 (12/12 1010) BP: (83-119)/(37-70) 102/57 mmHg (12/12 1400) SpO2:  [91 %-97 %] 95 % (12/12 1010) Weight:  [166 lb 1.6 oz (75.342 kg)] 166 lb 1.6 oz (75.342 kg) (12/12 0450) Last BM Date: 11/08/15  Filed Weights   11/07/15 0406 11/08/15 0511 11/09/15 0450  Weight: 166 lb 1.6 oz (75.342 kg) 165 lb 6.4 oz (75.025 kg) 166 lb 1.6 oz (75.342 kg)    Intake/Output Summary (Last 24 hours) at 11/09/15 1527 Last data filed at 11/09/15 1038  Gross per 24 hour  Intake    360 ml  Output    625 ml  Net   -265 ml    Telemetry: Atrial fibrillation.  Exam:  General: Elderly male, appears comfortable at rest.   HEENT: Conjunctiva and lids normal, all Cecille Rubin clear.  Lungs: Decreased breath sounds at the right base. No wheezing or labored breathing.  Cardiac: Irregularly irregular with soft apical systolic murmur. No elevated JVP.  Abdomen: NABS.  Extremities: No pitting edema.  Lab Results:  Basic Metabolic Panel:  Recent Labs Lab 11/04/15 1312  11/07/15 0228 11/08/15 0257 11/09/15 0245  NA 135  < > 135 134* 133*  K 4.5  < > 3.9 3.9 3.8  CL 104  < > 99* 96* 97*  CO2 26  < > 28 28 29   GLUCOSE 115*  < > 142* 135* 134*  BUN 18  < > 19 23* 26*  CREATININE 1.16  < > 1.41* 1.45* 1.26*  CALCIUM 8.7*  < > 8.4* 8.3* 8.3*  MG 2.0  --   --   --   --   < > = values in this interval not displayed.  Liver Function Tests:  Recent Labs Lab 11/07/15 0228 11/08/15 0257 11/09/15 0245  AST 27 23 25   ALT 19 16* 18  ALKPHOS 122 118 127*  BILITOT 1.1 1.0 0.9  PROT 5.7*  5.5* 5.8*  ALBUMIN 2.7* 2.5* 2.5*    CBC:  Recent Labs Lab 11/08/15 0040 11/08/15 0257 11/09/15 0245  WBC 8.1 8.3 7.9  HGB 13.1 13.1 13.3  HCT 38.3* 38.3* 40.3  MCV 94.6 95.0 95.5  PLT 159 152 148*    Cardiac Enzymes:  Recent Labs Lab 11/04/15 1300  CKTOTAL 42  CKMB 2.5   Echocardiogram 11/05/2015: Study Conclusions  - Left ventricle: The cavity size was normal. Systolic function was severely reduced. The estimated ejection fraction was in the range of 25% to 30%. Diffuse hypokinesis. - Aortic valve: Trileaflet; mildly thickened, mildly calcified leaflets. Cusp separation was reduced. - Aorta: The aorta was moderately calcified. - Mitral valve: Moderately calcified annulus. There was mild regurgitation. - Left atrium: The atrium was moderately dilated. - Right ventricle: The cavity size was moderately dilated. Wall thickness was normal. Systolic function was moderately reduced. - Right atrium: The atrium was severely dilated. - Tricuspid valve: There was moderate regurgitation. - Pulmonic valve: There was moderate regurgitation.  Impressions:  - EF is reduced when compared to prior study (50%).   Medications:   Scheduled Medications: . apixaban  2.5 mg Oral BID  . azelastine  2 spray Each Nare BID  . carvedilol  3.125 mg Oral BID WC  . cetaphil   Topical Daily  . digoxin  0.125 mg Oral Daily  . finasteride  5 mg Oral Daily  . insulin aspart  0-9 Units Subcutaneous TID WC  . insulin aspart  3 Units Subcutaneous TID WC  . levofloxacin  750 mg Oral Q48H  . lipase/protease/amylase  12,000 Units Oral TID AC  . multivitamin with minerals  1 tablet Oral Daily  . saccharomyces boulardii  250 mg Oral Daily  . sodium chloride  1 spray Each Nare BID  . sodium chloride  3 mL Intravenous Q12H  . sodium chloride  3 mL Intravenous Q12H  . sodium chloride  3 mL Intravenous Q12H  . tamsulosin  0.4 mg Oral BID     PRN Medications: sodium chloride, sodium  chloride, sodium chloride, acetaminophen, acetaminophen, diltiazem, magnesium hydroxide, ondansetron (ZOFRAN) IV, ondansetron (ZOFRAN) IV, sodium chloride, sodium chloride, sodium chloride   Assessment:   1. Persistent atrial fibrillation, failed Tikosyn and amiodarone. Recent visit with Dr. Caryl Comes was in October at which time strategy of heart rate control and anticoagulation was pursued.  Eliquis has been restarted  2. Acute on chronic systolic heart failure, LVEF 25-30%, decreased from 50% in 2013.   No clear etology .  On Coreg 3.125 BID.    Status post thoracentesis of moderate right sided pleural effusion, and is diuresing on IV Lasix.  3. Coronary artery calcifications noted by chest CT imaging.    Cath shows moderate CAD  4. History of hypothyroidism, recent TSH 6.8.  5. Essential hypertension, blood pressures have been low and limiting medical therapy to some degree.  6. Acute renal insufficiency, creatinine has improved slightly     He is very deconditioned .   Has not walked outside of the room.    Family is very concerned about how well he will get around .    May need PT / OT consult.   Will place order to expidite his care    Nahser, Wonda Cheng, MD  11/09/2015 3:46 PM    [address] [place],  [place], [place]  [address] Pager [phone] Phone: [phone]; Fax: [phone]   [facility]  [address] [place], [place]  [address] [phone]   Fax [phone]

## 2015-11-09 NOTE — Interval H&P Note (Signed)
History and Physical Interval Note:  11/09/2015 8:54 AM  [name]  has presented today for surgery, with the diagnosis of decreased ef  The various methods of treatment have been discussed with the patient and family. After consideration of risks, benefits and other options for treatment, the patient has consented to  Procedure(s): Right/Left Heart Cath and Coronary Angiography (N/A) and possible angioplasty as a surgical intervention .  The patient's history has been reviewed, patient examined, no change in status, stable for surgery.  I have reviewed the patient's chart and labs.  Questions were answered to the patient's satisfaction.     Bensimhon, Quillian Quince

## 2015-11-10 LAB — GLUCOSE, CAPILLARY
GLUCOSE-CAPILLARY: 126 mg/dL — AB (ref 65–99)
GLUCOSE-CAPILLARY: 120 mg/dL — AB (ref 65–99)
Glucose-Capillary: 132 mg/dL — ABNORMAL HIGH (ref 65–99)
Glucose-Capillary: 165 mg/dL — ABNORMAL HIGH (ref 65–99)

## 2015-11-10 MED ORDER — METOPROLOL TARTRATE 25 MG PO TABS: 25.0000 mg | ORAL_TABLET | Freq: Two times a day (BID) | ORAL | Status: DC

## 2015-11-10 MED ORDER — DIGOXIN 0.25 MG/ML IJ SOLN
0.2500 mg | Freq: Once | INTRAMUSCULAR | Status: DC
  Filled 2015-11-10: qty 1

## 2015-11-10 MED ORDER — CARVEDILOL 3.125 MG PO TABS: 3.1250 mg | ORAL_TABLET | Freq: Two times a day (BID) | ORAL | Status: DC

## 2015-11-10 MED ORDER — DIGOXIN 125 MCG PO TABS: 0.1250 mg | ORAL_TABLET | Freq: Every day | ORAL | Status: DC

## 2015-11-10 MED ORDER — LEVOFLOXACIN 750 MG PO TABS: 750.0000 mg | ORAL_TABLET | ORAL | Status: DC

## 2015-11-10 MED ORDER — APIXABAN 2.5 MG PO TABS: 2.5000 mg | ORAL_TABLET | Freq: Two times a day (BID) | ORAL | Status: DC

## 2015-11-10 MED ORDER — METOPROLOL TARTRATE 25 MG PO TABS
25.0000 mg | ORAL_TABLET | Freq: Two times a day (BID) | ORAL | Status: DC
  Administered 2015-11-10 – 2015-11-12 (×5): 25 mg via ORAL
  Filled 2015-11-10 (×5): qty 1

## 2015-11-10 MED FILL — Nitroglycerin IV Soln 100 MCG/ML in D5W: INTRA_ARTERIAL | Qty: 10 | Status: AC

## 2015-11-10 NOTE — Progress Notes (Signed)
Primary cardiologist: Dr. Virl Axe  Seen for followup: AF, cardiomyopathy  Subjective:    Pt had rapid rate with his chronic atrial fib this am. Was not able to ambulate with PT    Objective:   Temp:  [97.5 F (36.4 C)-98.1 F (36.7 C)] 97.5 F (36.4 C) (12/13 0449) Pulse Rate:  [94-120] 103 (12/13 0449) Resp:  [18] 18 (12/13 0449) BP: (91-116)/(51-77) 113/70 mmHg (12/13 0726) SpO2:  [93 %-95 %] 95 % (12/13 0449) Weight:  [166 lb 11.2 oz (75.615 kg)] 166 lb 11.2 oz (75.615 kg) (12/13 0449) Last BM Date: 11/09/15  Filed Weights   11/08/15 0511 11/09/15 0450 11/10/15 0449  Weight: 165 lb 6.4 oz (75.025 kg) 166 lb 1.6 oz (75.342 kg) 166 lb 11.2 oz (75.615 kg)    Intake/Output Summary (Last 24 hours) at 11/10/15 1153 Last data filed at 11/10/15 0900  Gross per 24 hour  Intake    480 ml  Output    600 ml  Net   -120 ml    Telemetry: Atrial fibrillation.  Exam:  General: Elderly male, appears comfortable at rest.   HEENT: Conjunctiva and lids normal, all Cecille Rubin clear.  Lungs: Decreased breath sounds at the right base. No wheezing or labored breathing.  Cardiac: Irregularly irregular with soft apical systolic murmur. No elevated JVP.  Abdomen: NABS.   Extremities: No pitting edema.  Lab Results:  Basic Metabolic Panel:  Recent Labs Lab 11/04/15 1312  11/07/15 0228 11/08/15 0257 11/09/15 0245  NA 135  < > 135 134* 133*  K 4.5  < > 3.9 3.9 3.8  CL 104  < > 99* 96* 97*  CO2 26  < > 28 28 29   GLUCOSE 115*  < > 142* 135* 134*  BUN 18  < > 19 23* 26*  CREATININE 1.16  < > 1.41* 1.45* 1.26*  CALCIUM 8.7*  < > 8.4* 8.3* 8.3*  MG 2.0  --   --   --   --   < > = values in this interval not displayed.  Liver Function Tests:  Recent Labs Lab 11/07/15 0228 11/08/15 0257 11/09/15 0245  AST 27 23 25   ALT 19 16* 18  ALKPHOS 122 118 127*  BILITOT 1.1 1.0 0.9  PROT 5.7* 5.5* 5.8*  ALBUMIN 2.7* 2.5* 2.5*    CBC:  Recent Labs Lab  11/08/15 0040 11/08/15 0257 11/09/15 0245  WBC 8.1 8.3 7.9  HGB 13.1 13.1 13.3  HCT 38.3* 38.3* 40.3  MCV 94.6 95.0 95.5  PLT 159 152 148*    Cardiac Enzymes:  Recent Labs Lab 11/04/15 1300  CKTOTAL 42  CKMB 2.5   Echocardiogram 11/05/2015: Study Conclusions  - Left ventricle: The cavity size was normal. Systolic function was severely reduced. The estimated ejection fraction was in the range of 25% to 30%. Diffuse hypokinesis. - Aortic valve: Trileaflet; mildly thickened, mildly calcified leaflets. Cusp separation was reduced. - Aorta: The aorta was moderately calcified. - Mitral valve: Moderately calcified annulus. There was mild regurgitation. - Left atrium: The atrium was moderately dilated. - Right ventricle: The cavity size was moderately dilated. Wall thickness was normal. Systolic function was moderately reduced. - Right atrium: The atrium was severely dilated. - Tricuspid valve: There was moderate regurgitation. - Pulmonic valve: There was moderate regurgitation.  Impressions:  - EF is reduced when compared to prior study (50%).   Medications:   Scheduled Medications: . apixaban  2.5 mg Oral BID  . azelastine  2 spray Each Nare BID  . cetaphil   Topical Daily  . digoxin  0.25 mg Intravenous Once  . digoxin  0.125 mg Oral Daily  . finasteride  5 mg Oral Daily  . insulin aspart  0-9 Units Subcutaneous TID WC  . insulin aspart  3 Units Subcutaneous TID WC  . levofloxacin  750 mg Oral Q48H  . lipase/protease/amylase  12,000 Units Oral TID AC  . metoprolol tartrate  25 mg Oral BID  . multivitamin with minerals  1 tablet Oral Daily  . saccharomyces boulardii  250 mg Oral Daily  . sodium chloride  1 spray Each Nare BID  . sodium chloride  3 mL Intravenous Q12H  . sodium chloride  3 mL Intravenous Q12H  . sodium chloride  3 mL Intravenous Q12H  . tamsulosin  0.4 mg Oral BID     PRN Medications: sodium chloride, sodium chloride, sodium  chloride, acetaminophen, acetaminophen, diltiazem, magnesium hydroxide, ondansetron (ZOFRAN) IV, ondansetron (ZOFRAN) IV, sodium chloride, sodium chloride, sodium chloride   Assessment:   1. Persistent atrial fibrillation, failed Tikosyn and amiodarone. Recent visit with Dr. Caryl Comes was in October at which time strategy of heart rate control and anticoagulation was pursued.  Eliquis has been restarted His rate goes up whenever he gets up - I suspect from his mild volume depletion. I have changed the coreg to metoprolol 25 bid and have encouraged him to drink.  I suspect his HR will decrease as he rehydrates himself and with the metoprolol / digoxin   2. Acute on chronic systolic heart failure, LVEF 25-30%, decreased from 50% in 2013.   No clear etology .  Cath showed minimal coronary irreg.  On metoprolol  He was very dry at the time of the cath.   I think he will feel better with some rehydration .   4. History of hypothyroidism, recent TSH 6.8.  5. Essential hypertension, blood pressures have been low and limiting medical therapy to some degree.  6. Acute renal insufficiency, creatinine has improved slightly   He is very deconditioned .   Has not walked outside of the room.    Family is very concerned about how well he will get around .      Alejo Beamer, Wonda Cheng, MD  11/10/2015 11:53 AM    [address],  [place], [place]  [address] Pager [phone] Phone: [phone]; Fax: [phone]   [facility]  [address] [place], [place]  [address] [phone]   Fax [phone]

## 2015-11-10 NOTE — Progress Notes (Signed)
PT Cancellation Note  Patient Details Name: [name] MRN: [id number] DOB: 18-Nov-1925   Cancelled Treatment:    Reason Eval/Treat Not Completed: Medical issues which prohibited therapy Per RN, pt's HR has been increasing to 160s with mobility. Awaiting Cardiology recs. Will await Cardiology and follow up next available time.   [name] [name] 11/10/2015, 10:18 AM Wray Kearns, PT, DPT [phone]

## 2015-11-10 NOTE — Discharge Summary (Addendum)
Physician Discharge Summary  [name] MRN: [id number] DOB/AGE: Jan 11, 1925 81 y.o.  PCP: [name], MD   Admit date: 11/04/2015 Discharge date: 11/10/2015  Discharge Diagnoses:     Principal Problem:   Pleural effusion Active Problems:   Type II diabetes mellitus with manifestations (HCC)   Persistent atrial fibrillation (HCC)   CAD (coronary artery disease)   Hypothyroid   Acute on chronic heart failure (HCC)   S/P thoracentesis   SOB (shortness of breath)   CAP (community acquired pneumonia)   Acute on chronic systolic CHF (congestive heart failure) (HCC)    Follow-up recommendations Follow-up with PCP in 3-5 days , including all  additional recommended appointments as below Follow-up CBC, CMP in 3-5 days Follow-up with cardiology in 1 week Follow-up chest x-ray in 1-2 weeks for right pleural effusion Patient to be discharged with home health     Medication List    STOP taking these medications        LEVEMIR FLEXTOUCH 100 UNIT/ML Pen  Generic drug:  Insulin Detemir      TAKE these medications        AMLACTIN XL Lotn  Apply 1 application topically daily. Apply lotion to legs     apixaban 2.5 MG Tabs tablet  Commonly known as:  ELIQUIS  Take 1 tablet (2.5 mg total) by mouth 2 (two) times daily.     azelastine 0.1 % nasal spray  Commonly known as:  ASTELIN  Place 2 sprays into both nostrils 2 (two) times daily. Use in each nostril as directed     carvedilol 3.125 MG tablet  Commonly known as:  COREG  Take 1 tablet (3.125 mg total) by mouth 2 (two) times daily with a meal.     CENTRUM SILVER PO  Take 1 tablet by mouth daily.     [employer] GLUCOSAMINE-CHONDROITIN PO  Take 1 tablet by mouth daily. 1200/600 mg     digoxin 0.125 MG tablet  Commonly known as:  LANOXIN  Take 1 tablet (0.125 mg total) by mouth daily.     finasteride 5 MG tablet  Commonly known as:  PROSCAR  Take 1 tablet (5 mg total) by mouth daily.     furosemide 20 MG tablet   Commonly known as:  LASIX  Take 1 tablet (20 mg total) by mouth every other day.  Start taking on:  11/11/2015     Insulin Pen Needle 31G X 5 MM Misc  Commonly known as:  B-D UF III MINI PEN NEEDLES  Inject 120 Devices into the skin 4 (four) times daily. Use a new needle for injecting insulin4 times a day     levofloxacin 750 MG tablet  Commonly known as:  LEVAQUIN  Take 1 tablet (750 mg total) by mouth every other day.     lipase/protease/amylase 12000 UNITS Cpep capsule  Commonly known as:  CREON  Take 1 capsule (12,000 Units total) by mouth 3 (three) times daily before meals.     NOVOLOG FLEXPEN 100 UNIT/ML FlexPen  Generic drug:  insulin aspart  Inject 4-8 Units into the skin 3 (three) times daily with meals. Inject 5 units subcutaneously with breakfast, 3 units with lunch and 5 units with supper     PROBIOTIC DAILY PO  Take 3 tablets by mouth daily. 3 tabs once a day with meals.     SALINE NA  Place 1 spray into both nostrils 2 (two) times daily.     tamsulosin 0.4 MG Caps capsule  Commonly known as:  FLOMAX  Take 1 capsule (0.4 mg total) by mouth 2 (two) times daily.     vitamin C 500 MG tablet  Commonly known as:  ASCORBIC ACID  Take 500 mg by mouth daily.         Discharge Condition: Stable   Discharge Instructions   Discharge Instructions    Diet - low sodium heart healthy    Complete by:  As directed      Diet - low sodium heart healthy    Complete by:  As directed      Increase activity slowly    Complete by:  As directed      Increase activity slowly    Complete by:  As directed                Disposition: 01-Home or Self Care   Consults:  Cardiology  Significant Diagnostic Studies:  Dg Chest 1 View  11/06/2015  CLINICAL DATA:  Post right thoracentesis EXAM: CHEST 1 VIEW COMPARISON:  11/05/2015 FINDINGS: Cardiomediastinal silhouette is stable. The right pleural effusion has resolved. Mild bilateral basilar atelectasis left greater  than right. Right hilar catheter is stable in position. No segmental infiltrate or pulmonary edema. No pneumothorax. IMPRESSION: The right pleural effusion has resolved. Mild bilateral basilar atelectasis left greater than right. Right hilar catheter is stable in position. No segmental infiltrate or pulmonary edema. No pneumothorax. Electronically Signed   By: Lahoma Crocker M.D.   On: 11/06/2015 17:13   Dg Chest 1 View  11/04/2015  CLINICAL DATA:  Initial encounter for status post right thoracentesis. EXAM: CHEST 1 VIEW COMPARISON:  11/04/2015, earlier the same day. FINDINGS: Interval decrease in right pleural effusion no evidence for pneumothorax. Stable appearance of catheter tubing over the medial right hemi thorax and heart silhouette. Left lung remains clear. The cardio pericardial silhouette is enlarged. Telemetry leads overlie the chest. IMPRESSION: No evidence for pneumothorax status post thoracentesis. Electronically Signed   By: Misty Stanley M.D.   On: 11/04/2015 16:56   Dg Chest 2 View  11/05/2015  CLINICAL DATA:  History of recent thoracentesis forl reduction of right pleural effusion, cough and congestion EXAM: CHEST  2 VIEW COMPARISON:  Portable chest x-ray of 11/04/2015 FINDINGS: After right thoracentesis, there is little change in the small right pleural effusion remaining with right basilar atelectasis. The left lung is clear. Cardiomegaly is stable. No pneumothorax is seen. IMPRESSION: No significant change after right thoracentesis in the volume of the right pleural effusion with right basilar atelectasis. Electronically Signed   By: Ivar Drape M.D.   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 EXAM: CHEST  2 VIEW COMPARISON:  PA and lateral chest x-ray of June 22, 2013 FINDINGS: There is a new moderate-sized right pleural  effusion occupying 1/2 of the right hemithorax. Right basilar atelectasis or pneumonia may be obscured. There is small amount of atelectasis or infiltrate at the left lung base medially. A trace of pleural fluid on the left is suspected. The cardiac silhouette is mildly enlarged. The pulmonary vascularity is normal. The bony thorax exhibits no acute abnormality. IMPRESSION: Since the previous study the patient has developed a moderate sized right pleural effusion. This may be obscuring right basilar atelectasis, pneumonia, or mass. There is a trace left pleural effusion and minimal left  basilar atelectasis or infiltrate. There is no evidence of CHF. Chest CT scanning is recommended. Electronically Signed   By: David  Martinique M.D.   On: 11/04/2015 11:30   Ct Angio Chest Pe W/cm &/or Wo Cm  11/05/2015  CLINICAL DATA:  Shortness of breath, pleural effusion. 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 Multiplanar CT image reconstructions and MIPs were obtained to evaluate the vascular anatomy. CONTRAST:  4m OMNIPAQUE IOHEXOL 350 MG/ML SOLN COMPARISON:  CT abdomen 12/16/ 15 images of the lung bases FINDINGS: Sagittal images of the spine shows osteopenia and degenerative changes thoracic spine. Sagittal view of the sternum is unremarkable. Atherosclerotic calcifications of thoracic aorta and coronary arteries. The study is of excellent technical quality. No pulmonary embolus is noted. Bilateral perihilar peribronchial thickening is noted. There is no mediastinal hematoma or adenopathy. Images of the upper abdomen shows partially visualized postsurgical changes post Whipple procedure. There is moderate size right pleural effusion. Small left pleural effusion. There is atelectasis in left lower lobe posteriorly. There is patchy  airspace disease/infiltrate in right middle lobe and right lower lobe. Findings are highly suspicious for pneumonia. There is a catheter fragment in main pulmonary artery extending from just above the origin in right main pulmonary artery and a small branch. The catheter measures at least 12.5 cm in length. This was only partially visualized on prior exam stable in position from prior exam. Review of the MIP images confirms the above findings. IMPRESSION: 1. No pulmonary embolus. 2. There is moderate size right pleural effusion. Small left pleural effusion. Atelectasis noted in left lower lobe posteriorly. There is consolidation with air bronchogram in right lower lobe and right middle lobe highly suspicious for infiltrate/pneumonia. 3. Mild perihilar bronchitic changes. 4. No mediastinal hematoma or adenopathy. 5. There is a catheter fragment in main pulmonary artery extending from just above the origin in right main pulmonary artery and a small branch. The catheter measures at least 12.5 cm in length. This was only partially visualized on prior exam stable in position from prior exam. These results were called by telephone at the time of interpretation on 11/05/2015 at 1:57 pm to Dr. NReyne Dumas, who verbally acknowledged these results. Electronically Signed   By: LLahoma CrockerM.D.   On: 11/05/2015 14:02   Dg Chest Port 1 View  11/09/2015  CLINICAL DATA:  Short of breath, chest pain EXAM: PORTABLE CHEST 1 VIEW COMPARISON:  Radiograph 11/06/2015, 10/30/2012 FINDINGS: There is a catheter in the RIGHT main pulmonary artery unchanged from 10/30/2012. Normal cardiac silhouette. There is increased airspace density in the RIGHT lower lobe. Small RIGHT effusion. No pneumothorax. IMPRESSION: Increasing RIGHT lower lobe airspace disease and small effusions concerning for pneumonia Electronically Signed   By: SSuzy BouchardM.D.   On: 11/09/2015 14:45   Dg Swallowing Func-speech Pathology  11/06/2015  Objective  Swallowing Evaluation:   Patient Details Name: LTIARA BARTOLIMRN: [id number] of Birth: [id number]'s Date: 11/06/2015 Time: SLP Start Time (ACUTE ONLY): 1009-SLP Stop Time (ACUTE ONLY): 1020 SLP Time Calculation (min) (ACUTE ONLY): 11 min Past Medical History: Past Medical History Diagnosis Date . Acid reflux disease    history of . H/O: GI bleed  . Paroxysmal atrial fibrillation (HCC)    chronic anticoag; tikosyn . Benign prostatic hypertrophy    history of . Hepatic damage march 2009  retained hepatic stone , intrahepatic duct catheter . CAD (coronary artery disease)  . Hypertension  . 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 Seizures ([place])  . Ventricular tachycardia ? Tikosyn proarrhtyhmia  . Cataracts, bilateral  . Diabetes mellitus    insulin dep . Pleural effusion 10/2015 . Shortness of breath dyspnea  Past Surgical History: Past Surgical History Procedure Laterality Date . Cardioversion   . Cholecystectomy     history of . Partial gastrectomy     history of . Shoulder surgery     left shoulder repair with 2 pens inserted . Whipple procedure     operation . 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 Inguinal hernia repair     inguinal herniorrhaphy, right... history of . Appendectomy     history of . Cardioversion  03/07/2012   Procedure: CARDIOVERSION;  Surgeon: Deboraha Sprang, MD;  Location: [place];  Service: Cardiovascular;  Laterality: N/A; . Cardioversion N/A 08/10/2015   Procedure: CARDIOVERSION;  Surgeon: Thayer Headings, MD;  Location: [facility] ENDOSCOPY;  Service: Cardiovascular;  Laterality: N/A; HPI: [name] is a 81 y.o. male, with permanent atrial fibrillation on Eliquis, diabetes mellitus, seizures, and history of Whipple procedure who presents to the emergency department with shortness of breath. Mr. Coats saw his primary care physician for cough and  shortness of breath and was sent to the emergency department after an x-ray showed a large right pleural effusion. Mr. Treiber reports that he has been feeling fatigued and badly for about the past month. But over the last week he has become much worse. He is no longer able to walk his daily 1/2 mile. He is unable to sleep. He has a dry cough but occasionally brings up frothy sputum. He also coughs when he eats or drinks liquids,  No Data Recorded Assessment / Plan / Recommendation CHL IP CLINICAL IMPRESSIONS 11/06/2015 Therapy Diagnosis Moderate pharyngeal phase dysphagia Clinical Impression Pt presents with moderate pharyngeal phase dysphagia characterized by one incidence of flash penetration with larger boluses of liquids, but clearance noted without strategies utilized; pt also exhibited moderate vallecular residue with puree-solid consistencies which required multiple repetitive swallows, effortful swallows and alternating solids/liquids to clear valleculae which place him at a moderate aspiration risk without use of strategies during po intake; decreased epiglottic inversion noted intermittently during study as well which also increases his aspiration risk.  Pt did not aspirate any consistency, but is certainly at risk if strategies are not utilized during po intake; ST to f/u during hospitalization for diet tolerance/pt education re: aspiration risk/swallowing strategy use and should be f/u with SLP at SNF. Impact on safety and function Moderate aspiration risk   CHL IP TREATMENT RECOMMENDATION 11/06/2015 Treatment Recommendations Therapy as outlined in treatment plan below   Prognosis 11/06/2015 Prognosis for Safe Diet Advancement Good Barriers to Reach Goals -- Barriers/Prognosis Comment -- CHL IP DIET RECOMMENDATION 11/06/2015 SLP Diet Recommendations Regular solids;Thin liquid Liquid Administration via Cup Medication Administration Whole meds with liquid Compensations Slow rate;Small sips/bites;Clear throat  intermittently;Effortful swallow Postural Changes Seated upright at 90 degrees   CHL IP OTHER RECOMMENDATIONS 11/06/2015 Recommended Consults -- Oral Care Recommendations Oral care BID Other Recommendations --   CHL IP FOLLOW UP RECOMMENDATIONS 11/05/2015 Follow up Recommendations ST f/u at SNF upon d/c   CHL IP FREQUENCY AND DURATION 11/06/2015 Speech Therapy  Frequency (ACUTE ONLY) min 2x/week Treatment Duration 1 week      CHL IP ORAL PHASE 11/06/2015 Oral Phase WFL Oral - Pudding Teaspoon -- Oral - Pudding Cup -- Oral - Honey Teaspoon -- Oral - Honey Cup -- Oral - Nectar Teaspoon -- Oral - Nectar Cup -- Oral - Nectar Straw -- Oral - Thin Teaspoon -- Oral - Thin Cup -- Oral - Thin Straw -- Oral - Puree -- Oral - Mech Soft -- Oral - Regular -- Oral - Multi-Consistency -- Oral - Pill -- Oral Phase - Comment --  CHL IP PHARYNGEAL PHASE 11/06/2015 Pharyngeal Phase Impaired Pharyngeal- Pudding Teaspoon -- Pharyngeal -- Pharyngeal- Pudding Cup -- Pharyngeal -- Pharyngeal- Honey Teaspoon -- Pharyngeal -- Pharyngeal- Honey Cup -- Pharyngeal -- Pharyngeal- Nectar Teaspoon -- Pharyngeal -- Pharyngeal- Nectar Cup -- Pharyngeal -- Pharyngeal- Nectar Straw -- Pharyngeal -- Pharyngeal- Thin Teaspoon -- Pharyngeal -- Pharyngeal- Thin Cup Penetration/Aspiration during swallow;Other (Comment) Pharyngeal (No Data) Pharyngeal- Thin Straw -- Pharyngeal -- Pharyngeal- Puree Reduced epiglottic inversion;Pharyngeal residue - valleculae;Compensatory strategies attempted (with notebox) Pharyngeal -- Pharyngeal- Mechanical Soft -- Pharyngeal -- Pharyngeal- Regular Reduced epiglottic inversion;Pharyngeal residue - valleculae;Compensatory strategies attempted (with notebox) Pharyngeal -- Pharyngeal- Multi-consistency -- Pharyngeal -- Pharyngeal- Pill -- Pharyngeal -- Pharyngeal Comment --  CHL IP CERVICAL ESOPHAGEAL PHASE 11/06/2015 Cervical Esophageal Phase WFL Pudding Teaspoon -- Pudding Cup -- Honey Teaspoon -- Honey Cup -- Nectar Teaspoon --  Nectar Cup -- Nectar Straw -- Thin Teaspoon -- Thin Cup -- Thin Straw -- Puree -- Mechanical Soft -- Regular -- Multi-consistency -- Pill -- Cervical Esophageal Comment -- ADAMS,PAT, M.S., CCC-SLP 11/06/2015, 10:31 AM              US Thoracentesis Asp Pleural Space W/img Guide  11/06/2015  Ardis Rowan, PA-C     11/06/2015  4:37 PM Successful US guided right thoracentesis. Yielded 1.6 liters of clear yellow fluid. Pt tolerated procedure well. No immediate complications. Specimen was not sent for labs. CXR ordered. Abigail Butts S BLAIR PA-C 11/06/2015 4:36 PM   US Thoracentesis Asp Pleural Space W/img Guide  11/04/2015  INDICATION: Symptomatic right sided pleural effusion EXAM: US THORACENTESIS ASP PLEURAL SPACE W/IMG GUIDE COMPARISON:  CXR 11/04/2015. MEDICATIONS: None COMPLICATIONS: None immediate TECHNIQUE: Informed written consent was obtained from the patient after a discussion of the risks, benefits and alternatives to treatment. A timeout was performed prior to the initiation of the procedure. Initial ultrasound scanning demonstrates a right pleural effusion. The lower chest was prepped and draped in the usual sterile fashion. 1% lidocaine was used for local anesthesia. An ultrasound image was saved for documentation purposes. A 6 Fr Safe-T-Centesis catheter was introduced. The thoracentesis was performed. The catheter was removed and a dressing was applied. The patient tolerated the procedure well without immediate post procedural complication. The patient was escorted to have an upright chest radiograph. FINDINGS: A total of approximately 1.4 Liters of yellow fluid was removed. Requested samples were sent to the laboratory. IMPRESSION: Successful ultrasound-guided right sided thoracentesis yielding 1.4 liters of pleural fluid. Read By:  Tsosie Billing PA-C Electronically Signed   By: Lucrezia Europe M.D.   On: 11/04/2015 16:31      2-D echo LV EF: 25% -  30% Impressions:  - EF is reduced when compared  to prior study (50%).  Cardiac catheterization Assessment:  1. Mild non-obstructive CAD 2. Nonischemic CM with EF 25% by echo 3. Chronic AF 4. Well-compensated hemodynamics with low filling pressures  Plan/Discussion:  Medical  therapy. Restart Eliquis this evening   Filed Weights   11/08/15 0511 11/09/15 0450 11/10/15 0449  Weight: 75.025 kg (165 lb 6.4 oz) 75.342 kg (166 lb 1.6 oz) 75.615 kg (166 lb 11.2 oz)     Microbiology: Recent Results (from the past 240 hour(s))  Culture, body fluid-bottle     Status: None   Collection Time: 11/04/15  4:18 PM  Result Value Ref Range Status   Specimen Description FLUID PLEURAL RIGHT  Final   Special Requests BOTTLES DRAWN AEROBIC AND ANAEROBIC 10CC  Final   Culture NO GROWTH 5 DAYS  Final   Report Status 11/09/2015 FINAL  Final  Gram stain     Status: None   Collection Time: 11/04/15  4:18 PM  Result Value Ref Range Status   Specimen Description FLUID PLEURAL RIGHT  Final   Special Requests NONE  Final   Gram Stain   Final    WBC PRESENT, PREDOMINANTLY MONONUCLEAR NO ORGANISMS SEEN CYTOSPIN    Report Status 11/04/2015 FINAL  Final  MRSA PCR Screening     Status: None   Collection Time: 11/05/15  8:19 PM  Result Value Ref Range Status   MRSA by PCR NEGATIVE NEGATIVE Final    Comment:        The GeneXpert MRSA Assay (FDA approved for NASAL specimens only), is one component of a comprehensive MRSA colonization surveillance program. It is not intended to diagnose MRSA infection nor to guide or monitor treatment for MRSA infections.        Blood Culture    Component Value Date/Time   SDES FLUID PLEURAL RIGHT 11/04/2015 1618   SDES FLUID PLEURAL RIGHT 11/04/2015 1618   SPECREQUEST BOTTLES DRAWN AEROBIC AND ANAEROBIC 10CC 11/04/2015 1618   SPECREQUEST NONE 11/04/2015 1618   CULT NO GROWTH 5 DAYS 11/04/2015 1618   REPTSTATUS 11/09/2015 FINAL 11/04/2015 1618   REPTSTATUS 11/04/2015 FINAL 11/04/2015 1618       Labs: Results for orders placed or performed during the hospital encounter of 11/04/15 (from the past 48 hour(s))  Glucose, capillary     Status: Abnormal   Collection Time: 11/08/15 11:29 AM  Result Value Ref Range   Glucose-Capillary 161 (H) 65 - 99 mg/dL   Comment 1 Notify RN    Comment 2 Document in Chart   Glucose, capillary     Status: Abnormal   Collection Time: 11/08/15  4:34 PM  Result Value Ref Range   Glucose-Capillary 111 (H) 65 - 99 mg/dL   Comment 1 Notify RN    Comment 2 Document in Chart   Glucose, capillary     Status: Abnormal   Collection Time: 11/08/15  9:03 PM  Result Value Ref Range   Glucose-Capillary 150 (H) 65 - 99 mg/dL  CBC     Status: Abnormal   Collection Time: 11/09/15  2:45 AM  Result Value Ref Range   WBC 7.9 4.0 - 10.5 K/uL   RBC 4.22 4.22 - 5.81 MIL/uL   Hemoglobin 13.3 13.0 - 17.0 g/dL   HCT 40.3 39.0 - 52.0 %   MCV 95.5 78.0 - 100.0 fL   MCH 31.5 26.0 - 34.0 pg   MCHC 33.0 30.0 - 36.0 g/dL   RDW 13.4 11.5 - 15.5 %   Platelets 148 (L) 150 - 400 K/uL  Comprehensive metabolic panel     Status: Abnormal   Collection Time: 11/09/15  2:45 AM  Result Value Ref Range   Sodium 133 (L) 135 - 145 mmol/L  Potassium 3.8 3.5 - 5.1 mmol/L   Chloride 97 (L) 101 - 111 mmol/L   CO2 29 22 - 32 mmol/L   Glucose, Bld 134 (H) 65 - 99 mg/dL   BUN 26 (H) 6 - 20 mg/dL   Creatinine, Ser 1.26 (H) 0.61 - 1.24 mg/dL   Calcium 8.3 (L) 8.9 - 10.3 mg/dL   Total Protein 5.8 (L) 6.5 - 8.1 g/dL   Albumin 2.5 (L) 3.5 - 5.0 g/dL   AST 25 15 - 41 U/L   ALT 18 17 - 63 U/L   Alkaline Phosphatase 127 (H) 38 - 126 U/L   Total Bilirubin 0.9 0.3 - 1.2 mg/dL   GFR calc non Af Amer 49 (L) >60 mL/min   GFR calc Af Amer 57 (L) >60 mL/min    Comment: (NOTE) The eGFR has been calculated using the CKD EPI equation. This calculation has not been validated in all clinical situations. eGFR's persistently <60 mL/min signify possible Chronic Kidney Disease.    Anion gap  7 5 - 15  Protime-INR     Status: Abnormal   Collection Time: 11/09/15  2:45 AM  Result Value Ref Range   Prothrombin Time 16.6 (H) 11.6 - 15.2 seconds   INR 1.32 0.00 - 1.49  Glucose, capillary     Status: Abnormal   Collection Time: 11/09/15  6:43 AM  Result Value Ref Range   Glucose-Capillary 161 (H) 65 - 99 mg/dL  I-STAT 3, arterial blood gas (G3+)     Status: Abnormal   Collection Time: 11/09/15  9:10 AM  Result Value Ref Range   pH, Arterial 7.490 (H) 7.350 - 7.450   pCO2 arterial 38.2 35.0 - 45.0 mmHg   pO2, Arterial 62.0 (L) 80.0 - 100.0 mmHg   Bicarbonate 29.2 (H) 20.0 - 24.0 mEq/L   TCO2 30 0 - 100 mmol/L   O2 Saturation 93.0 %   Acid-Base Excess 6.0 (H) 0.0 - 2.0 mmol/L   Patient temperature HIDE    Sample type ARTERIAL   I-STAT 3, venous blood gas (G3P V)     Status: Abnormal   Collection Time: 11/09/15  9:14 AM  Result Value Ref Range   pH, Ven 7.444 (H) 7.250 - 7.300   pCO2, Ven 39.6 (L) 45.0 - 50.0 mmHg   pO2, Ven 29.0 (LL) 30.0 - 45.0 mmHg   Bicarbonate 27.2 (H) 20.0 - 24.0 mEq/L   TCO2 28 0 - 100 mmol/L   O2 Saturation 58.0 %   Acid-Base Excess 3.0 (H) 0.0 - 2.0 mmol/L   Patient temperature HIDE    Sample type MIXED VENOUS SAMPLE    Comment NOTIFIED PHYSICIAN   I-STAT 3, venous blood gas (G3P V)     Status: Abnormal   Collection Time: 11/09/15  9:15 AM  Result Value Ref Range   pH, Ven 7.445 (H) 7.250 - 7.300   pCO2, Ven 42.9 (L) 45.0 - 50.0 mmHg   pO2, Ven 30.0 30.0 - 45.0 mmHg   Bicarbonate 29.5 (H) 20.0 - 24.0 mEq/L   TCO2 31 0 - 100 mmol/L   O2 Saturation 61.0 %   Acid-Base Excess 5.0 (H) 0.0 - 2.0 mmol/L   Patient temperature HIDE    Sample type MIXED VENOUS SAMPLE    Comment NOTIFIED PHYSICIAN   Glucose, capillary     Status: Abnormal   Collection Time: 11/09/15  9:37 AM  Result Value Ref Range   Glucose-Capillary 113 (H) 65 - 99 mg/dL  Glucose, capillary  Status: Abnormal   Collection Time: 11/09/15 11:24 AM  Result Value Ref Range    Glucose-Capillary 123 (H) 65 - 99 mg/dL   Comment 1 Notify RN   Glucose, capillary     Status: Abnormal   Collection Time: 11/09/15  4:17 PM  Result Value Ref Range   Glucose-Capillary 181 (H) 65 - 99 mg/dL   Comment 1 Notify RN    Comment 2 Document in Chart   Glucose, capillary     Status: Abnormal   Collection Time: 11/09/15  8:53 PM  Result Value Ref Range   Glucose-Capillary 109 (H) 65 - 99 mg/dL  Glucose, capillary     Status: Abnormal   Collection Time: 11/10/15  6:29 AM  Result Value Ref Range   Glucose-Capillary 132 (H) 65 - 99 mg/dL     Lipid Panel     Component Value Date/Time   CHOL 114 10/21/2015 1049   TRIG 59.0 10/21/2015 1049   TRIG 57 12/05/2006 1459   HDL 46.20 10/21/2015 1049   CHOLHDL 2 10/21/2015 1049   CHOLHDL 3.3 CALC 12/05/2006 1459   VLDL 11.8 10/21/2015 1049   LDLCALC 56 10/21/2015 1049     Lab Results  Component Value Date   HGBA1C 6.5 10/21/2015   HGBA1C 6.0 06/24/2015   HGBA1C 6.3 02/25/2015     Lab Results  Component Value Date   MICROALBUR <0.7 02/25/2015   LDLCALC 56 10/21/2015   CREATININE 1.26* 11/09/2015     81 y.o. male, with permanent atrial fibrillation on Eliquis, diabetes mellitus, seizures, and history of Whipple procedure who presents to the emergency department with shortness of breath. [name] saw his primary care physician this morning for cough and shortness of breath and was sent to the emergency department after an x-ray showed a large right pleural effusion. Mr. Byington reports that he has been feeling fatigued and badly for about the past month. But over the last week he has become much worse. He is no longer able to walk his daily 1/2 mile. He is unable to sleep. He has a dry cough but occasionally brings up frothy sputum. He also coughs when he eats or drinks liquids, +orthopnea, +PND, +insomnia. The patient reports he was taken off of amiodarone approximately 3 mos ago, and was told by his cardiologist, Dr.  Caryl Comes, that his afib was going to be permanent. Patient found to have persistent atrial fibrillation and acute on chronic systolic heart failure, status post thoracentesis on the right side 2, cardiac cath during this admission.   Assessment and plan Shortness of breath likely secondary to Acute systolic heart failure, LVEF 25-30%, decreased from 50% in 2013.  Large Right-sided pleural effusion Appears to be transudative in nature, await cytology results , holding Lasix in anticipation of cardiac cath tomorrow, blood pressure soft  S/p thoracentesis twice this admission. They removed 1.4 liters fluid on 12/7, repeat thoracentesis on 12/9, 1.6 L of yellow fluid removed CT chest shows a moderate-sized right pleural effusion, questionable right lower lobe pneumonia, started on Levaquin Chronic fragment from a catheter measuring 12.5 cm which was present on prior CT Speech therapy consultation to rule out aspiration, started on regular diet and thin liquid Empirically treated with levofloxacin continue for another 5 days, although I doubt that the patient has a pneumonia clinically no fever no cough no white count Venous doppler No evidence of DVT, superficial thrombosis, or [name]'s Cyst    Acute on chronic systolic heart failure Patient appears in mild  volume overload. + JVD, +1 bilateral lower extremity edema. Repeat 2-D echocardiogram shows EF of 25-30%. , Previously 50%, cardiology consulted  Continue Coreg. No ACE inhibitor as blood pressure soft.  Continue lasix at 20 mg /day Low blood pressure limits further up titration of Coreg, Coreg reduced to 3.125 mg BID, cardiology also added Lanoxin to assist with rate control. Hold off on adding ACE inhibitor or ARB as well with low blood pressure and allergy to lisinopril.  Follow BMP   Coronary artery calcifications noted by chest CT imaging. No prior history of obstructive CAD or myocardial infarction. Troponin I level 0.02 during  this hospital stay. Cardiac cath done 12/12, normal coronaries .resume Eliquis   Persistent atrial fibrillation, uncontrolled   failed Tikosyn and amiodarone. Recent visit with Dr. Caryl Comes was in October at which time strategy of heart rate control and anticoagulation was pursued.   Eliquis was held in anticipation of diagnostic cardiac catheterization on Monday as per Dr. Debara Pickett, resumed prior to discharge Very hard to control rate during this stay, Lanoxin was added to assist with rate control, patient also received 0.5 mg IV digoxin 12/10, also received iv diltiazem as needed  Coreg changed to metoprolol 25 bid by Dr Cathie Olden  and  Cards  encouraged him to drink.  Will DC home today if OK with cardiology      Type 2 diabetesCBG stable after discontinuation of Levemir, Levemir discontinued due to hypoglycemia.  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 Continue Creon supplementation.  Hypothyroidism Not currently on thyroid hormone supplementation? TSH 6.85. Free T4 within normal limits    DVT prophylaxsis eliquis    Discharge Exam:    Blood pressure 113/70, pulse 103, temperature 97.5 F (36.4 C), temperature source Oral, resp. rate 18, height 6' 2"  (1.88 m), weight 75.615 kg (166 lb 11.2 oz), SpO2 95 %.  General: Elderly male, appears comfortable at rest.  HEENT: Conjunctiva and lids normal, all Cecille Rubin clear.  Lungs: Decreased breath sounds at the right base. No wheezing or labored breathing.  Cardiac: Irregularly irregular with soft apical systolic murmur. No elevated JVP.  Abdomen: NABS.  Extremities: No pitting edema.    Follow-up Information    Follow up with [name], MD. Schedule an appointment as soon as possible for a visit in 3 days.   Specialty:  Internal Medicine   Why:  pt to call to schedule for appointment    Contact information:   [address] [place] [address] [phone]       Follow up  with Virl Axe, MD. Schedule an appointment as soon as possible for a visit on 11/25/2015.   Specialty:  Cardiology   Why:  pt app  is on 11/25/15 8:00   Contact information:   [address] [place] [address] [phone]       Signed: Reyne Dumas 11/10/2015, 10:33 AM        Time spent >45 mins

## 2015-11-10 NOTE — Progress Notes (Signed)
Pt HR increased to 140s with ambulation to the bathroom. Pt states he feels weak and SOB from this movement. HR returns to low 100s with rest.   Will continue to monitor.   [name]

## 2015-11-10 NOTE — Progress Notes (Signed)
Patient continues to be tachycardic, with minimal exertion, discharge on hold pending further recommendations from cardiology today PT to evaluate patient after he is seen by cardiology

## 2015-11-10 NOTE — Progress Notes (Signed)
UR Completed. Rangel Echeverri, RN, BSN.  [phone] 

## 2015-11-10 NOTE — Care Management Note (Addendum)
Case Management Note  Patient Details  Name: [name] MRN: [id number] Date of Birth: 13-Oct-1925  Subjective/Objective:    Pt admitted with plueral effusions, underwent thoracentesis yesterday 11/04/15                Action/Plan:  Pt is independent with wife from [employer].  Pt stated he had a scale and will begin using it daily, pt stated he adheres to low sodium diet.  Pt denies difficulty with ambulating or performing ADLS.  CM will continue to monitor for disposition needs   Expected Discharge Date:                  Expected Discharge Plan:  Home/Self Care (Pt is from the Independent portion of [place] with wife)  In-House Referral:     Discharge planning Services     Post Acute Care Choice:    Choice offered to:  Patient  DME Arranged:    DME Agency:     HH Arranged:  PT, OT, Nurse's Aide [place] Agency:  [facility]  Status of Service:  Complete, will sign off  Medicare Important Message Given:  Yes Date Medicare IM Given:    Medicare IM give by:    Date Additional Medicare IM Given:    Additional Medicare Important Message give by:     If discussed at [place] of Stay Meetings, dates discussed:    Additional Comments: 11/10/2015 Pt did not discharge yesterday as planned due to uncontrolled HR, medications have been modified, pt will need to monitored here in house to verify HR is controlled prior to discharge.   CM contacted [facility] to verify that in house [facility] could provide ordered [place], CM informed that in house [facility] could not provide all services ordered, CM spoke with RN Debbie with [facility] program within community; CM was instructed that when services can not be provided in house, the preferred agency of choice is [place].  CM spoke with pt and pt is agreeable to agency.  CM contacted agency and referral was accepted.   11/09/15 CM assessed pt prior to discharge, no CM needs determined  Maryclare Labrador, RN 11/10/2015, 2:51 PM

## 2015-11-10 NOTE — Care Management (Signed)
CM text paged PT to request early am evaluation

## 2015-11-10 NOTE — Evaluation (Signed)
Physical Therapy Evaluation Patient Details Name: [name] MRN: [id number] DOB: [birth year] Today's Date: 11/10/2015   History of Present Illness  Patient is a 81 y/o male with hx of GERD, GI bleed, PAF, BPH, DM, CAD, HTN, sarcoma (tricep), cholangiocarcinoma, Szr, and Vtachwho presents with SOB, R pleural effusion and CHF. s/p cardiac cath. CXR-Increasing RIGHT lower lobe airspace disease and small effusions concerning for PNA.  Clinical Impression  Patient presents with functional limitations due to deficits listed in PT problem list (see below). Pt with generalized weakness and abnormal rise in HR during activity to 150 bpm. Cardiology following. Tolerated short distance ambulation with Min guard assist for safety. Recommend use of RW at home for safety. Pt avid walker PTA, walking 1/2 mile per day. Anticipate with increased activity, pt will be able to return to PLOF. Encouraged ambulation multiple times per day with RN. Will follow acutely to maximize independence and mobility prior to return home.    Follow Up Recommendations Home health PT;Supervision - Intermittent    Equipment Recommendations  None recommended by PT    Recommendations for Other Services OT consult     Precautions / Restrictions Precautions Precautions: Fall Precaution Comments: monitor HR Restrictions Weight Bearing Restrictions: No      Mobility  Bed Mobility               General bed mobility comments: Sitting in chair upon PT arrival.   Transfers Overall transfer level: Needs assistance Equipment used: Rolling walker (2 wheeled) Transfers: Sit to/from Stand Sit to Stand: Min guard         General transfer comment: Min guard for safety. Stood from chair x1, from [employer].   Ambulation/Gait Ambulation/Gait assistance: Min guard Ambulation Distance (Feet): 25 Feet (x2 bouts) Assistive device: Rolling walker (2 wheeled) Gait Pattern/deviations: Step-through pattern;Decreased stride  length;Trunk flexed Gait velocity: decreased   General Gait Details: Slow, mildly unsteady gait. Requires use of RW for support. Pt in A-fib, ranged from 121-146 bpm.  Stairs            Wheelchair Mobility    Modified Rankin (Stroke Patients Only)       Balance Overall balance assessment: Needs assistance Sitting-balance support: Feet supported;No upper extremity supported Sitting balance-Leahy Scale: Good     Standing balance support: During functional activity Standing balance-Leahy Scale: Fair Standing balance comment: Furniture walking within room however balance improved with use of RW and UE support.                             Pertinent Vitals/Pain Pain Assessment: No/denies pain    Home Living Family/patient expects to be discharged to:: Private residence Living Arrangements: Spouse/significant other Available Help at Discharge: Available PRN/intermittently (Staff at [employer]) Type of Home: Apartment Home Access: Elevator;Level entry (3rd floor)     Home Layout: One level Home Equipment: [place] - 2 wheels;Cane - single point;Shower seat - built in      Prior Function Level of Independence: Independent               Hand Dominance   Dominant Hand: Right    Extremity/Trunk Assessment   Upper Extremity Assessment: Defer to OT evaluation           Lower Extremity Assessment: Generalized weakness         Communication   Communication: No difficulties  Cognition Arousal/Alertness: Awake/alert Behavior During Therapy: WFL for tasks assessed/performed Overall Cognitive Status:  Within Functional Limits for tasks assessed                      General Comments      Exercises        Assessment/Plan    PT Assessment Patient needs continued PT services  PT Diagnosis Difficulty walking;Generalized weakness   PT Problem List Decreased strength;Cardiopulmonary status limiting activity;Decreased activity  tolerance;Decreased balance;Decreased mobility  PT Treatment Interventions Balance training;Gait training;Functional mobility training;Therapeutic activities;Therapeutic exercise;Patient/family education   PT Goals (Current goals can be found in the Care Plan section) Acute Rehab PT Goals Patient Stated Goal: to go home PT Goal Formulation: With patient Time For Goal Achievement: 11/24/15 Potential to Achieve Goals: Good    Frequency Min 3X/week   Barriers to discharge Decreased caregiver support Lives with wife but she is not able to assist.     Co-evaluation               End of Session Equipment Utilized During Treatment: Gait belt Activity Tolerance: Treatment limited secondary to medical complications (Comment) (elevated HR- A-fib) Patient left: in chair;with call bell/phone within reach Nurse Communication: Mobility status         Time: [id number] PT Time Calculation (min) (ACUTE ONLY): 17 min   Charges:   PT Evaluation $Initial PT Evaluation Tier I: 1 Procedure     PT G Codes:        Gilbert Manolis A Everlene Cunning 11/10/2015, 1:38 PM Wray Kearns, [place], DPT [phone]

## 2015-11-10 NOTE — Evaluation (Addendum)
Occupational Therapy Evaluation Patient Details Name: [name] MRN: [id number] DOB: 1925-02-08 Today's Date: 11/10/2015    History of Present Illness Patient is an 81 y.o. male with hx of GERD, GI bleed, PAF, BPH, DM, CAD, HTN, sarcoma (tricep), cholangiocarcinoma, Szr, and Vtachwho presents with SOB, R pleural effusion and CHF. s/p cardiac cath. CXR-Increasing RIGHT lower lobe airspace disease and small effusions concerning for PNA.   Clinical Impression   Pt admitted with above. Pt independent with ADLs, PTA. Feel pt will benefit from acute OT to increase independence and activity tolerance prior to d/c. HR up to 130s in session.  Follow Up Recommendations  Home health OT;Supervision/Assistance - 24 hour    Equipment Recommendations  None recommended by OT    Recommendations for Other Services       Precautions / Restrictions Precautions Precautions: Fall Precaution Comments: monitor HR Restrictions Weight Bearing Restrictions: No      Mobility Bed Mobility               General bed mobility comments: pt in recliner upon OT arrival  Transfers Overall transfer level: Needs assistance Transfers: Sit to/from Stand Sit to Stand: Min assist (Min guard-Min assist)         General transfer comment: RW in frotn for support    Balance Balance not formally assessed. Ambulated mostly with RW and short distance without it.                       ADL Overall ADL's : Needs assistance/impaired     Grooming: Set up;Sitting;Supervision/safety;Brushing hair               Lower Body Dressing: Min guard;Sit to/from stand   Toilet Transfer: Minimal assistance;Ambulation;RW (Min assist-Min guard for sit to stand; Min guard-walking)           Functional mobility during ADLs: Min guard;Rolling walker (walked short distance without RW) General ADL Comments: Educated on energy conservation.      Vision     Perception     Praxis       Pertinent Vitals/Pain Pain Assessment: No/denies pain; HR up to 130s in session.     Hand Dominance Pt was right handed but had injury to RUE and now uses mostly left hand   Extremity/Trunk Assessment Upper Extremity Assessment Upper Extremity Assessment: RUE deficits/detail;LUE deficits/detail RUE Deficits / Details: previous injury to left arm and now has decreased AROM shoulder flexion and decreased elbow extension due to injury LUE Deficits / Details: slight weakness with left shoulder flexors   Lower Extremity Assessment Lower Extremity Assessment: Defer to PT evaluation       Communication Communication Communication: No difficulties   Cognition Arousal/Alertness: Awake/alert Behavior During Therapy: WFL for tasks assessed/performed Overall Cognitive Status:  (WFL with exception of decreased safety awareness-reports he has been getting up by himself)                      General Comments       Exercises       Shoulder Instructions      Home Living Family/patient expects to be discharged to:: Private residence Living Arrangements: Spouse/significant other (wife uses a cane) Available Help at Discharge: Available PRN/intermittently (staff at [employer]) Type of Home: Apartment Home Access: Elevator;Level entry (3rd floor)     Home Layout: One level     Bathroom Shower/Tub: Occupational psychologist: Standard     Home  Equipment: [place] Rile - 2 wheels;Cane - single point;Shower seat - built in          Prior Functioning/Environment Level of Independence: Independent             OT Diagnosis: Generalized weakness   OT Problem List: Decreased strength;Decreased range of motion;Decreased knowledge of precautions;Decreased knowledge of use of DME or AE;Decreased safety awareness;Decreased activity tolerance   OT Treatment/Interventions: Self-care/ADL training;DME and/or AE instruction;Therapeutic activities;Patient/family  education;Balance training;Cognitive remediation/compensation;Energy conservation;Therapeutic exercise    OT Goals(Current goals can be found in the care plan section) Acute Rehab OT Goals Patient Stated Goal: not stated OT Goal Formulation: With patient Time For Goal Achievement: 11/17/15 Potential to Achieve Goals: Good ADL Goals Pt Will Perform Lower Body Bathing: with supervision;sit to/from stand (including gathering items) Pt Will Transfer to Toilet: ambulating;with supervision Additional ADL Goal #1: Pt will independently verbalize 3/3 energy conservation techniques and utilize in session as needed.  OT Frequency: Min 2X/week   Barriers to D/C:            Co-evaluation              End of Session Equipment Utilized During Treatment: Gait belt;Rolling walker Nurse Communication: Other (comment) (asked tech to get chair alarm for pt)  Activity Tolerance: Patient tolerated treatment well Patient left: in chair;with call bell/phone within reach   Time: [id number] OT Time Calculation (min): 14 min Charges:  OT General Charges $OT Visit: 1 Procedure OT Evaluation $Initial OT Evaluation Tier I: 1 Procedure G-CodesBenito Mccreedy OTR/L [id number] 11/10/2015, 3:44 PM

## 2015-11-10 NOTE — Progress Notes (Signed)
Speech Language Pathology Treatment: Dysphagia  Patient Details Name: [name] MRN: [id number] DOB: 05-10-1925 Today's Date: 11/10/2015 Time: [id number] SLP Time Calculation (min) (ACUTE ONLY): 16 min  Assessment / Plan / Recommendation Clinical Impression  ST follow up for therapeutic diet tolerance.  Chart review indicated that the patient has been afebrile, lungs are clear but diminished and his intake has been good.  However, chest x-ray from 11/09/2015 showing increasing RLL airspace disease.  Nursing reporting no overt issues.   Reviewed swallow precautions with the patient.  Clinically the patient presented with a timely swallow trigger and adequate hyo-laryngeal excursion.  Mastication of dry solids appeared functional.  Overt s/s of aspiration were not observed.  The patient does complain of sinus drainage between meals.  Placed signage in room to remind the patient of his safe swallow precautions.  ST will continue to follow.      HPI HPI: Saleem Fuess is a 81 y.o. male, with permanent atrial fibrillation on Eliquis, diabetes mellitus, seizures, and history of Whipple procedure who presents to the emergency department with shortness of breath. Mr. Hessman saw his primary care physician this morning for cough and shortness of breath and was sent to the emergency department after an x-ray showed a large right pleural effusion. Mr. Yanagi reports that he has been feeling fatigued and badly for about the past month. But over the last week he has become much worse. He is no longer able to walk his daily 1/2 mile. He is unable to sleep. He has a dry cough but occasionally brings up frothy sputum. He also coughs when he eats or drinks liquids,       SLP Plan  Continue with current plan of care     Recommendations  Diet recommendations: Regular;Thin liquid Liquids provided via: Cup;Straw Medication Administration: Whole meds with liquid Supervision: Patient able to self  feed Compensations: Slow rate;Small sips/bites;Clear throat intermittently;Effortful swallow Postural Changes and/or Swallow Maneuvers: Seated upright 90 degrees;Upright 30-60 min after meal              Oral Care Recommendations: Oral care BID Follow up Recommendations: None Plan: Continue with current plan of care  Shelly Flatten, [place], [place] [phone] Lamar Sprinkles 11/10/2015, 9:56 AM

## 2015-11-11 LAB — GLUCOSE, CAPILLARY
GLUCOSE-CAPILLARY: 129 mg/dL — AB (ref 65–99)
GLUCOSE-CAPILLARY: 155 mg/dL — AB (ref 65–99)
Glucose-Capillary: 147 mg/dL — ABNORMAL HIGH (ref 65–99)
Glucose-Capillary: 90 mg/dL (ref 65–99)

## 2015-11-11 MED ORDER — AMIODARONE HCL 200 MG PO TABS
400.0000 mg | ORAL_TABLET | Freq: Two times a day (BID) | ORAL | Status: DC
  Administered 2015-11-11 – 2015-11-12 (×3): 400 mg via ORAL
  Filled 2015-11-11 (×5): qty 2

## 2015-11-11 NOTE — Discharge Summary (Signed)
Physician Discharge Summary  [name] MRN: [id number] DOB/AGE: 1925/04/27 81 y.o.  PCP: [name], MD   Admit date: 11/04/2015 Discharge date: 11/11/2015  Discharge Diagnoses:     Principal Problem:   Pleural effusion Active Problems:   Type II diabetes mellitus with manifestations (HCC)   Persistent atrial fibrillation (HCC)   CAD (coronary artery disease)   Hypothyroid   Acute on chronic heart failure (HCC)   S/P thoracentesis   SOB (shortness of breath)   CAP (community acquired pneumonia)   Acute on chronic systolic CHF (congestive heart failure) (HCC)    Follow-up recommendations Follow-up with PCP in 3-5 days , including all  additional recommended appointments as below Follow-up CBC, CMP in 3-5 days Follow-up with cardiology in 1 week Follow-up chest x-ray in 1-2 weeks for right pleural effusion Patient to be discharged with home health Follow up on cytology results for the right pleural effusion    Medication List    STOP taking these medications        carvedilol 6.25 MG tablet  Commonly known as:  COREG     LEVEMIR FLEXTOUCH 100 UNIT/ML Pen  Generic drug:  Insulin Detemir      TAKE these medications        AMLACTIN XL Lotn  Apply 1 application topically daily. Apply lotion to legs     apixaban 2.5 MG Tabs tablet  Commonly known as:  ELIQUIS  Take 1 tablet (2.5 mg total) by mouth 2 (two) times daily.     azelastine 0.1 % nasal spray  Commonly known as:  ASTELIN  Place 2 sprays into both nostrils 2 (two) times daily. Use in each nostril as directed     CENTRUM SILVER PO  Take 1 tablet by mouth daily.     [employer] GLUCOSAMINE-CHONDROITIN PO  Take 1 tablet by mouth daily. 1200/600 mg     digoxin 0.125 MG tablet  Commonly known as:  LANOXIN  Take 1 tablet (0.125 mg total) by mouth daily.     finasteride 5 MG tablet  Commonly known as:  PROSCAR  Take 1 tablet (5 mg total) by mouth daily.     furosemide 20 MG tablet  Commonly known as:   LASIX  Take 1 tablet (20 mg total) by mouth every other day.     Insulin Pen Needle 31G X 5 MM Misc  Commonly known as:  B-D UF III MINI PEN NEEDLES  Inject 120 Devices into the skin 4 (four) times daily. Use a new needle for injecting insulin4 times a day     levofloxacin 750 MG tablet  Commonly known as:  LEVAQUIN  Take 1 tablet (750 mg total) by mouth every other day.     lipase/protease/amylase 12000 UNITS Cpep capsule  Commonly known as:  CREON  Take 1 capsule (12,000 Units total) by mouth 3 (three) times daily before meals.     metoprolol tartrate 25 MG tablet  Commonly known as:  LOPRESSOR  Take 1 tablet (25 mg total) by mouth 2 (two) times daily.     NOVOLOG FLEXPEN 100 UNIT/ML FlexPen  Generic drug:  insulin aspart  Inject 4-8 Units into the skin 3 (three) times daily with meals. Inject 5 units subcutaneously with breakfast, 3 units with lunch and 5 units with supper     PROBIOTIC DAILY PO  Take 3 tablets by mouth daily. 3 tabs once a day with meals.     SALINE NA  Place 1 spray into both  nostrils 2 (two) times daily.     tamsulosin 0.4 MG Caps capsule  Commonly known as:  FLOMAX  Take 1 capsule (0.4 mg total) by mouth 2 (two) times daily.     vitamin C 500 MG tablet  Commonly known as:  ASCORBIC ACID  Take 500 mg by mouth daily.         Discharge Condition: Stable   Discharge Instructions   Discharge Instructions    Diet - low sodium heart healthy    Complete by:  As directed      Diet - low sodium heart healthy    Complete by:  As directed      Increase activity slowly    Complete by:  As directed      Increase activity slowly    Complete by:  As directed                Disposition: 01-Home or Self Care   Consults:  Cardiology  Significant Diagnostic Studies:  Dg Chest 1 View  11/06/2015  CLINICAL DATA:  Post right thoracentesis EXAM: CHEST 1 VIEW COMPARISON:  11/05/2015 FINDINGS: Cardiomediastinal silhouette is stable. The right  pleural effusion has resolved. Mild bilateral basilar atelectasis left greater than right. Right hilar catheter is stable in position. No segmental infiltrate or pulmonary edema. No pneumothorax. IMPRESSION: The right pleural effusion has resolved. Mild bilateral basilar atelectasis left greater than right. Right hilar catheter is stable in position. No segmental infiltrate or pulmonary edema. No pneumothorax. Electronically Signed   By: Lahoma Crocker M.D.   On: 11/06/2015 17:13   Dg Chest 1 View  11/04/2015  CLINICAL DATA:  Initial encounter for status post right thoracentesis. EXAM: CHEST 1 VIEW COMPARISON:  11/04/2015, earlier the same day. FINDINGS: Interval decrease in right pleural effusion no evidence for pneumothorax. Stable appearance of catheter tubing over the medial right hemi thorax and heart silhouette. Left lung remains clear. The cardio pericardial silhouette is enlarged. Telemetry leads overlie the chest. IMPRESSION: No evidence for pneumothorax status post thoracentesis. Electronically Signed   By: Misty Stanley M.D.   On: 11/04/2015 16:56   Dg Chest 2 View  11/05/2015  CLINICAL DATA:  History of recent thoracentesis forl reduction of right pleural effusion, cough and congestion EXAM: CHEST  2 VIEW COMPARISON:  Portable chest x-ray of 11/04/2015 FINDINGS: After right thoracentesis, there is little change in the small right pleural effusion remaining with right basilar atelectasis. The left lung is clear. Cardiomegaly is stable. No pneumothorax is seen. IMPRESSION: No significant change after right thoracentesis in the volume of the right pleural effusion with right basilar atelectasis. Electronically Signed   By: Ivar Drape M.D.   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 EXAM: CHEST  2 VIEW COMPARISON:  PA and lateral  chest x-ray of June 22, 2013 FINDINGS: There is a new moderate-sized right pleural effusion occupying 1/2 of the right hemithorax. Right basilar atelectasis or pneumonia may be obscured. There is small amount of atelectasis or infiltrate at the left lung base medially. A trace of pleural fluid on the left is suspected. The cardiac silhouette is mildly enlarged. The pulmonary vascularity is normal. The bony thorax exhibits no acute abnormality. IMPRESSION: Since the previous study the patient has developed a moderate sized right pleural effusion. This may be obscuring right  basilar atelectasis, pneumonia, or mass. There is a trace left pleural effusion and minimal left basilar atelectasis or infiltrate. There is no evidence of CHF. Chest CT scanning is recommended. Electronically Signed   By: David  Martinique M.D.   On: 11/04/2015 11:30   Ct Angio Chest Pe W/cm &/or Wo Cm  11/05/2015  CLINICAL DATA:  Shortness of breath, pleural effusion. 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 Multiplanar CT image reconstructions and MIPs were obtained to evaluate the vascular anatomy. CONTRAST:  33mL OMNIPAQUE IOHEXOL 350 MG/ML SOLN COMPARISON:  CT abdomen 12/16/ 15 images of the lung bases FINDINGS: Sagittal images of the spine shows osteopenia and degenerative changes thoracic spine. Sagittal view of the sternum is unremarkable. Atherosclerotic calcifications of thoracic aorta and coronary arteries. The study is of excellent technical quality. No pulmonary embolus is noted. Bilateral perihilar peribronchial thickening is noted. There is no mediastinal hematoma or adenopathy. Images of the upper abdomen shows partially visualized postsurgical changes post Whipple procedure. There is moderate size right pleural effusion. Small left pleural  effusion. There is atelectasis in left lower lobe posteriorly. There is patchy airspace disease/infiltrate in right middle lobe and right lower lobe. Findings are highly suspicious for pneumonia. There is a catheter fragment in main pulmonary artery extending from just above the origin in right main pulmonary artery and a small branch. The catheter measures at least 12.5 cm in length. This was only partially visualized on prior exam stable in position from prior exam. Review of the MIP images confirms the above findings. IMPRESSION: 1. No pulmonary embolus. 2. There is moderate size right pleural effusion. Small left pleural effusion. Atelectasis noted in left lower lobe posteriorly. There is consolidation with air bronchogram in right lower lobe and right middle lobe highly suspicious for infiltrate/pneumonia. 3. Mild perihilar bronchitic changes. 4. No mediastinal hematoma or adenopathy. 5. There is a catheter fragment in main pulmonary artery extending from just above the origin in right main pulmonary artery and a small branch. The catheter measures at least 12.5 cm in length. This was only partially visualized on prior exam stable in position from prior exam. These results were called by telephone at the time of interpretation on 11/05/2015 at 1:57 pm to Dr. Reyne Dumas , who verbally acknowledged these results. Electronically Signed   By: Lahoma Crocker M.D.   On: 11/05/2015 14:02   Dg Chest Port 1 View  11/09/2015  CLINICAL DATA:  Short of breath, chest pain EXAM: PORTABLE CHEST 1 VIEW COMPARISON:  Radiograph 11/06/2015, 10/30/2012 FINDINGS: There is a catheter in the RIGHT main pulmonary artery unchanged from 10/30/2012. Normal cardiac silhouette. There is increased airspace density in the RIGHT lower lobe. Small RIGHT effusion. No pneumothorax. IMPRESSION: Increasing RIGHT lower lobe airspace disease and small effusions concerning for pneumonia Electronically Signed   By: Suzy Bouchard M.D.   On:  11/09/2015 14:45   Dg Swallowing Func-speech Pathology  11/06/2015  Objective Swallowing Evaluation:   Patient Details Name: [name] MRN: [id number] Date of Birth: 1925-04-05 Today's Date: 11/06/2015 Time: SLP Start Time (ACUTE ONLY): 1009-SLP Stop Time (ACUTE ONLY): 1020 SLP Time Calculation (min) (ACUTE ONLY): 11 min Past Medical History: Past Medical History Diagnosis Date . Acid reflux disease    history of . H/O: GI bleed  . Paroxysmal atrial fibrillation (HCC)    chronic anticoag; tikosyn .  Benign prostatic hypertrophy    history of . Hepatic damage march 2009   retained hepatic stone , intrahepatic duct catheter . CAD (coronary artery disease)  . Hypertension  . 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 Seizures ([place])  . Ventricular tachycardia ? Tikosyn proarrhtyhmia  . Cataracts, bilateral  . Diabetes mellitus    insulin dep . Pleural effusion 10/2015 . Shortness of breath dyspnea  Past Surgical History: Past Surgical History Procedure Laterality Date . Cardioversion   . Cholecystectomy     history of . Partial gastrectomy     history of . Shoulder surgery     left shoulder repair with 2 pens inserted . Whipple procedure     operation . 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 Inguinal hernia repair     inguinal herniorrhaphy, right... history of . Appendectomy     history of . Cardioversion  03/07/2012   Procedure: CARDIOVERSION;  Surgeon: Deboraha Sprang, MD;  Location: [facility];  Service: Cardiovascular;  Laterality: N/A; . Cardioversion N/A 08/10/2015   Procedure: CARDIOVERSION;  Surgeon: Thayer Headings, MD;  Location[facility] ENDOSCOPY;  Service: Cardiovascular;  Laterality: N/A; HPI: [name] is a 81 y.o. male, with permanent atrial fibrillation on Eliquis, diabetes mellitus, seizures, and history of Whipple procedure who presents to the emergency department with  shortness of breath. [name] saw his primary care physician for cough and shortness of breath and was sent to the emergency department after an x-ray showed a large right pleural effusion. [name] reports that he has been feeling fatigued and badly for about the past month. But over the last week he has become much worse. He is no longer able to walk his daily 1/2 mile. He is unable to sleep. He has a dry cough but occasionally brings up frothy sputum. He also coughs when he eats or drinks liquids,  No Data Recorded Assessment / Plan / Recommendation CHL IP CLINICAL IMPRESSIONS 11/06/2015 Therapy Diagnosis Moderate pharyngeal phase dysphagia Clinical Impression Pt presents with moderate pharyngeal phase dysphagia characterized by one incidence of flash penetration with larger boluses of liquids, but clearance noted without strategies utilized; pt also exhibited moderate vallecular residue with puree-solid consistencies which required multiple repetitive swallows, effortful swallows and alternating solids/liquids to clear valleculae which place him at a moderate aspiration risk without use of strategies during po intake; decreased epiglottic inversion noted intermittently during study as well which also increases his aspiration risk.  Pt did not aspirate any consistency, but is certainly at risk if strategies are not utilized during po intake; ST to f/u during hospitalization for diet tolerance/pt education re: aspiration risk/swallowing strategy use and should be f/u with SLP at SNF. Impact on safety and function Moderate aspiration risk   CHL IP TREATMENT RECOMMENDATION 11/06/2015 Treatment Recommendations Therapy as outlined in treatment plan below   Prognosis 11/06/2015 Prognosis for Safe Diet Advancement Good Barriers to Reach Goals -- Barriers/Prognosis Comment -- CHL IP DIET RECOMMENDATION 11/06/2015 SLP Diet Recommendations Regular solids;Thin liquid Liquid Administration via Cup Medication Administration  Whole meds with liquid Compensations Slow rate;Small sips/bites;Clear throat intermittently;Effortful swallow Postural Changes Seated upright at 90 degrees   CHL IP OTHER RECOMMENDATIONS 11/06/2015 Recommended Consults -- Oral Care Recommendations Oral care BID Other Recommendations --   CHL IP FOLLOW UP RECOMMENDATIONS 11/05/2015 Follow up Recommendations ST  f/u at SNF upon d/c   CHL IP FREQUENCY AND DURATION 11/06/2015 Speech Therapy Frequency (ACUTE ONLY) min 2x/week Treatment Duration 1 week      CHL IP ORAL PHASE 11/06/2015 Oral Phase WFL Oral - Pudding Teaspoon -- Oral - Pudding Cup -- Oral - Honey Teaspoon -- Oral - Honey Cup -- Oral - Nectar Teaspoon -- Oral - Nectar Cup -- Oral - Nectar Straw -- Oral - Thin Teaspoon -- Oral - Thin Cup -- Oral - Thin Straw -- Oral - Puree -- Oral - Mech Soft -- Oral - Regular -- Oral - Multi-Consistency -- Oral - Pill -- Oral Phase - Comment --  CHL IP PHARYNGEAL PHASE 11/06/2015 Pharyngeal Phase Impaired Pharyngeal- Pudding Teaspoon -- Pharyngeal -- Pharyngeal- Pudding Cup -- Pharyngeal -- Pharyngeal- Honey Teaspoon -- Pharyngeal -- Pharyngeal- Honey Cup -- Pharyngeal -- Pharyngeal- Nectar Teaspoon -- Pharyngeal -- Pharyngeal- Nectar Cup -- Pharyngeal -- Pharyngeal- Nectar Straw -- Pharyngeal -- Pharyngeal- Thin Teaspoon -- Pharyngeal -- Pharyngeal- Thin Cup Penetration/Aspiration during swallow;Other (Comment) Pharyngeal (No Data) Pharyngeal- Thin Straw -- Pharyngeal -- Pharyngeal- Puree Reduced epiglottic inversion;Pharyngeal residue - valleculae;Compensatory strategies attempted (with notebox) Pharyngeal -- Pharyngeal- Mechanical Soft -- Pharyngeal -- Pharyngeal- Regular Reduced epiglottic inversion;Pharyngeal residue - valleculae;Compensatory strategies attempted (with notebox) Pharyngeal -- Pharyngeal- Multi-consistency -- Pharyngeal -- Pharyngeal- Pill -- Pharyngeal -- Pharyngeal Comment --  CHL IP CERVICAL ESOPHAGEAL PHASE 11/06/2015 Cervical Esophageal Phase WFL Pudding  Teaspoon -- Pudding Cup -- Honey Teaspoon -- Honey Cup -- Nectar Teaspoon -- Nectar Cup -- Nectar Straw -- Thin Teaspoon -- Thin Cup -- Thin Straw -- Puree -- Mechanical Soft -- Regular -- Multi-consistency -- Pill -- Cervical Esophageal Comment -- ADAMS,PAT, M.S., CCC-SLP 11/06/2015, 10:31 AM              US Thoracentesis Asp Pleural Space W/img Guide  11/06/2015  Ardis Rowan, PA-C     11/06/2015  4:37 PM Successful US guided right thoracentesis. Yielded 1.6 liters of clear yellow fluid. Pt tolerated procedure well. No immediate complications. Specimen was not sent for labs. CXR ordered. Abigail Butts S BLAIR PA-C 11/06/2015 4:36 PM   US Thoracentesis Asp Pleural Space W/img Guide  11/04/2015  INDICATION: Symptomatic right sided pleural effusion EXAM: US THORACENTESIS ASP PLEURAL SPACE W/IMG GUIDE COMPARISON:  CXR 11/04/2015. MEDICATIONS: None COMPLICATIONS: None immediate TECHNIQUE: Informed written consent was obtained from the patient after a discussion of the risks, benefits and alternatives to treatment. A timeout was performed prior to the initiation of the procedure. Initial ultrasound scanning demonstrates a right pleural effusion. The lower chest was prepped and draped in the usual sterile fashion. 1% lidocaine was used for local anesthesia. An ultrasound image was saved for documentation purposes. A 6 Fr Safe-T-Centesis catheter was introduced. The thoracentesis was performed. The catheter was removed and a dressing was applied. The patient tolerated the procedure well without immediate post procedural complication. The patient was escorted to have an upright chest radiograph. FINDINGS: A total of approximately 1.4 Liters of yellow fluid was removed. Requested samples were sent to the laboratory. IMPRESSION: Successful ultrasound-guided right sided thoracentesis yielding 1.4 liters of pleural fluid. Read By:  Tsosie Billing PA-C Electronically Signed   By: Lucrezia Europe M.D.   On: 11/04/2015 16:31       2-D echo LV EF: 25% -  30% Impressions:  - EF is reduced when compared to prior study (50%).  Cardiac catheterization Assessment:  1. Mild non-obstructive CAD 2. Nonischemic CM with EF 25% by  echo 3. Chronic AF 4. Well-compensated hemodynamics with low filling pressures  Plan/Discussion:  Medical therapy. Restart Eliquis this evening   Filed Weights   11/09/15 0450 11/10/15 0449 11/11/15 0504  Weight: 75.342 kg (166 lb 1.6 oz) 75.615 kg (166 lb 11.2 oz) 75.5 kg (166 lb 7.2 oz)     Microbiology: Recent Results (from the past 240 hour(s))  Culture, body fluid-bottle     Status: None   Collection Time: 11/04/15  4:18 PM  Result Value Ref Range Status   Specimen Description FLUID PLEURAL RIGHT  Final   Special Requests BOTTLES DRAWN AEROBIC AND ANAEROBIC 10CC  Final   Culture NO GROWTH 5 DAYS  Final   Report Status 11/09/2015 FINAL  Final  Gram stain     Status: None   Collection Time: 11/04/15  4:18 PM  Result Value Ref Range Status   Specimen Description FLUID PLEURAL RIGHT  Final   Special Requests NONE  Final   Gram Stain   Final    WBC PRESENT, PREDOMINANTLY MONONUCLEAR NO ORGANISMS SEEN CYTOSPIN    Report Status 11/04/2015 FINAL  Final  MRSA PCR Screening     Status: None   Collection Time: 11/05/15  8:19 PM  Result Value Ref Range Status   MRSA by PCR NEGATIVE NEGATIVE Final    Comment:        The GeneXpert MRSA Assay (FDA approved for NASAL specimens only), is one component of a comprehensive MRSA colonization surveillance program. It is not intended to diagnose MRSA infection nor to guide or monitor treatment for MRSA infections.        Blood Culture    Component Value Date/Time   SDES FLUID PLEURAL RIGHT 11/04/2015 1618   SDES FLUID PLEURAL RIGHT 11/04/2015 1618   SPECREQUEST BOTTLES DRAWN AEROBIC AND ANAEROBIC 10CC 11/04/2015 1618   SPECREQUEST NONE 11/04/2015 1618   CULT NO GROWTH 5 DAYS 11/04/2015 1618   REPTSTATUS 11/09/2015  FINAL 11/04/2015 1618   REPTSTATUS 11/04/2015 FINAL 11/04/2015 1618      Labs: Results for orders placed or performed during the hospital encounter of 11/04/15 (from the past 48 hour(s))  Glucose, capillary     Status: Abnormal   Collection Time: 11/09/15 11:24 AM  Result Value Ref Range   Glucose-Capillary 123 (H) 65 - 99 mg/dL   Comment 1 Notify RN   Glucose, capillary     Status: Abnormal   Collection Time: 11/09/15  4:17 PM  Result Value Ref Range   Glucose-Capillary 181 (H) 65 - 99 mg/dL   Comment 1 Notify RN    Comment 2 Document in Chart   Glucose, capillary     Status: Abnormal   Collection Time: 11/09/15  8:53 PM  Result Value Ref Range   Glucose-Capillary 109 (H) 65 - 99 mg/dL  Glucose, capillary     Status: Abnormal   Collection Time: 11/10/15  6:29 AM  Result Value Ref Range   Glucose-Capillary 132 (H) 65 - 99 mg/dL  Glucose, capillary     Status: Abnormal   Collection Time: 11/10/15 11:24 AM  Result Value Ref Range   Glucose-Capillary 165 (H) 65 - 99 mg/dL   Comment 1 Notify RN   Glucose, capillary     Status: Abnormal   Collection Time: 11/10/15  4:54 PM  Result Value Ref Range   Glucose-Capillary 126 (H) 65 - 99 mg/dL   Comment 1 Notify RN    Comment 2 Document in Chart   Glucose, capillary  Status: Abnormal   Collection Time: 11/10/15  9:07 PM  Result Value Ref Range   Glucose-Capillary 120 (H) 65 - 99 mg/dL  Glucose, capillary     Status: Abnormal   Collection Time: 11/11/15  6:33 AM  Result Value Ref Range   Glucose-Capillary 129 (H) 65 - 99 mg/dL   Comment 1 Notify RN    Comment 2 Document in Chart      Lipid Panel     Component Value Date/Time   CHOL 114 10/21/2015 1049   TRIG 59.0 10/21/2015 1049   TRIG 57 12/05/2006 1459   HDL 46.20 10/21/2015 1049   CHOLHDL 2 10/21/2015 1049   CHOLHDL 3.3 CALC 12/05/2006 1459   VLDL 11.8 10/21/2015 1049   LDLCALC 56 10/21/2015 1049     Lab Results  Component Value Date   HGBA1C 6.5  10/21/2015   HGBA1C 6.0 06/24/2015   HGBA1C 6.3 02/25/2015     Lab Results  Component Value Date   MICROALBUR <0.7 02/25/2015   LDLCALC 56 10/21/2015   CREATININE 1.26* 11/09/2015     81 y.o. male, with permanent atrial fibrillation on Eliquis, diabetes mellitus, seizures, and history of Whipple procedure who presents to the emergency department with shortness of breath. [name] saw his primary care physician this morning for cough and shortness of breath and was sent to the emergency department after an x-ray showed a large right pleural effusion. [name] reports that he has been feeling fatigued and badly for about the past month. But over the last week he has become much worse. He is no longer able to walk his daily 1/2 mile. He is unable to sleep. He has a dry cough but occasionally brings up frothy sputum. He also coughs when he eats or drinks liquids, +orthopnea, +PND, +insomnia. The patient reports he was taken off of amiodarone approximately 3 mos ago, and was told by his cardiologist, Dr. Caryl Comes, that his afib was going to be permanent. Patient found to have persistent atrial fibrillation and acute on chronic systolic heart failure, status post thoracentesis on the right side 2, cardiac cath during this admission.   Assessment and plan Shortness of breath likely secondary to Acute systolic heart failure, LVEF 25-30%, decreased from 50% in 2013.  Large Right-sided pleural effusion Appears to be transudative in nature, await cytology results , held Lasix in anticipation of cardiac cath and low blood pressure. S/p thoracentesis twice this admission.removed 1.4 liters fluid on 12/7, repeat thoracentesis on 12/9, removed 1.6 L. CT chest shows a moderate-sized right pleural effusion, questionable right lower lobe pneumonia, started on Levaquin every 48 hours 7 days Chronic fragment from a catheter measuring 12.5 cm which was present on prior CT, patient states he has had it for  20 years Speech therapy consultation to rule out aspiration, started on regular diet and thin liquid Venous doppler No evidence of DVT, superficial thrombosis, or [name]'s Cyst    Acute on chronic systolic heart failure Patient appears in mild volume overload. + JVD, +1 bilateral lower extremity edema. Repeat 2-D echocardiogram shows EF of 25-30%. , Previously 50%, cardiology consulted  No ACE inhibitor as blood pressure soft. Coreg switched to metoprolol Continue lasix at 20 mg /day  cardiology also added Lanoxin to assist with rate control. Hold off on adding ACE inhibitor or ARB as well with low blood pressure and allergy to lisinopril.  Follow BMP   Coronary artery calcifications noted by chest CT imaging. No prior history of obstructive CAD  or myocardial infarction. Troponin I level 0.02 during this hospital stay. Cardiac cath done 12/12, normal coronaries .resume Eliquis   Persistent atrial fibrillation, uncontrolled   failed Tikosyn and amiodarone. Recent visit with Dr. Caryl Comes was in October at which time strategy of heart rate control and anticoagulation was pursued.   Eliquis was held in anticipation of diagnostic cardiac catheterization on Monday as per Dr. Debara Pickett, resumed prior to discharge Very hard to control rate during this stay, Lanoxin was added to assist with rate control, patient also received 0.5 mg IV digoxin 12/10, also received iv diltiazem as needed  Coreg changed to metoprolol 25 bid by Dr Cathie Olden  and  Cards  encouraged him to drink.  Will DC home today if OK with cardiology      Type 2 diabetesCBG stable after discontinuation of Levemir, Levemir discontinued due to hypoglycemia.  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 Continue Creon supplementation.  Hypothyroidism Not currently on thyroid hormone supplementation? TSH 6.85. Free T4 within normal limits    DVT prophylaxsis eliquis    Discharge Exam:    Blood  pressure 91/38, pulse 99, temperature 97.8 F (36.6 C), temperature source Oral, resp. rate 18, height 6\' 2"  (1.88 m), weight 75.5 kg (166 lb 7.2 oz), SpO2 94 %.  General: Elderly male, appears comfortable at rest.  HEENT: Conjunctiva and lids normal, all [name] clear.  Lungs: Decreased breath sounds at the right base. No wheezing or labored breathing.  Cardiac: Irregularly irregular with soft apical systolic murmur. No elevated JVP.  Abdomen: NABS.  Extremities: No pitting edema.    Follow-up Information    Follow up with [name], MD. Schedule an appointment as soon as possible for a visit in 3 days.   Specialty:  Internal Medicine   Why:  pt to call to schedule for appointment    Contact information:   [address] [address] [phone]       Follow up with Virl Axe, MD. Schedule an appointment as soon as possible for a visit on 11/25/2015.   Specialty:  Cardiology   Why:  pt app  is on 11/25/15 8:00   Contact information:   [address] [place] [address] [phone]       Follow up with [facility].   Why:  physical therapy, occupational therapy, nurse aide   Contact information:   [place] [address] [phone]       Signed: Reyne Dumas 11/11/2015, 9:57 AM        Time spent >45 mins

## 2015-11-11 NOTE — Progress Notes (Addendum)
Primary cardiologist: Dr. Virl Axe  Seen for followup: AF, cardiomyopathy  Subjective:    Pt had rapid rate with his chronic atrial fib this am. Has ambulated with PT ,  Did better today     Objective:   Temp:  [97.6 F (36.4 C)-97.8 F (36.6 C)] 97.8 F (36.6 C) (12/14 0504) Pulse Rate:  [66-99] 99 (12/14 0504) Resp:  [18] 18 (12/14 0504) BP: (91-101)/(38-56) 91/38 mmHg (12/14 0504) SpO2:  [94 %-98 %] 94 % (12/14 0504) Weight:  [166 lb 7.2 oz (75.5 kg)] 166 lb 7.2 oz (75.5 kg) (12/14 0504) Last BM Date: 11/10/15  Filed Weights   11/09/15 0450 11/10/15 0449 11/11/15 0504  Weight: 166 lb 1.6 oz (75.342 kg) 166 lb 11.2 oz (75.615 kg) 166 lb 7.2 oz (75.5 kg)    Intake/Output Summary (Last 24 hours) at 11/11/15 1048 Last data filed at 11/11/15 0800  Gross per 24 hour  Intake   1200 ml  Output    376 ml  Net    824 ml    Telemetry: Atrial fibrillation.  Exam:  General: Elderly male, appears comfortable at rest.   HEENT: Conjunctiva and lids normal, all Cecille Rubin clear.  Lungs: Decreased breath sounds at the right base. No wheezing or labored breathing.  Cardiac: Irregularly irregular with soft apical systolic murmur.  [id number] diastolic murmur/   No elevated JVP.  Abdomen: NABS.   Extremities: No pitting edema.  Lab Results:  Basic Metabolic Panel:  Recent Labs Lab 11/04/15 1312  11/07/15 0228 11/08/15 0257 11/09/15 0245  NA 135  < > 135 134* 133*  K 4.5  < > 3.9 3.9 3.8  CL 104  < > 99* 96* 97*  CO2 26  < > 28 28 29   GLUCOSE 115*  < > 142* 135* 134*  BUN 18  < > 19 23* 26*  CREATININE 1.16  < > 1.41* 1.45* 1.26*  CALCIUM 8.7*  < > 8.4* 8.3* 8.3*  MG 2.0  --   --   --   --   < > = values in this interval not displayed.  Liver Function Tests:  Recent Labs Lab 11/07/15 0228 11/08/15 0257 11/09/15 0245  AST 27 23 25   ALT 19 16* 18  ALKPHOS 122 118 127*  BILITOT 1.1 1.0 0.9  PROT 5.7* 5.5* 5.8*  ALBUMIN 2.7* 2.5* 2.5*     CBC:  Recent Labs Lab 11/08/15 0040 11/08/15 0257 11/09/15 0245  WBC 8.1 8.3 7.9  HGB 13.1 13.1 13.3  HCT 38.3* 38.3* 40.3  MCV 94.6 95.0 95.5  PLT 159 152 148*    Cardiac Enzymes:  Recent Labs Lab 11/04/15 1300  CKTOTAL 42  CKMB 2.5   Echocardiogram 11/05/2015: Study Conclusions  - Left ventricle: The cavity size was normal. Systolic function was severely reduced. The estimated ejection fraction was in the range of 25% to 30%. Diffuse hypokinesis. - Aortic valve: Trileaflet; mildly thickened, mildly calcified leaflets. Cusp separation was reduced. - Aorta: The aorta was moderately calcified. - Mitral valve: Moderately calcified annulus. There was mild regurgitation. - Left atrium: The atrium was moderately dilated. - Right ventricle: The cavity size was moderately dilated. Wall thickness was normal. Systolic function was moderately reduced. - Right atrium: The atrium was severely dilated. - Tricuspid valve: There was moderate regurgitation. - Pulmonic valve: There was moderate regurgitation.  Impressions:  - EF is reduced when compared to prior study (50%).   Medications:   Scheduled Medications: . apixaban  2.5 mg Oral BID  . azelastine  2 spray Each Nare BID  . cetaphil   Topical Daily  . digoxin  0.25 mg Intravenous Once  . digoxin  0.125 mg Oral Daily  . finasteride  5 mg Oral Daily  . insulin aspart  0-9 Units Subcutaneous TID WC  . insulin aspart  3 Units Subcutaneous TID WC  . levofloxacin  750 mg Oral Q48H  . lipase/protease/amylase  12,000 Units Oral TID AC  . metoprolol tartrate  25 mg Oral BID  . multivitamin with minerals  1 tablet Oral Daily  . saccharomyces boulardii  250 mg Oral Daily  . sodium chloride  1 spray Each Nare BID  . sodium chloride  3 mL Intravenous Q12H  . sodium chloride  3 mL Intravenous Q12H  . sodium chloride  3 mL Intravenous Q12H  . tamsulosin  0.4 mg Oral BID     PRN Medications: sodium chloride,  sodium chloride, sodium chloride, acetaminophen, acetaminophen, diltiazem, magnesium hydroxide, ondansetron (ZOFRAN) IV, ondansetron (ZOFRAN) IV, sodium chloride, sodium chloride, sodium chloride   Assessment:   1. Persistent atrial fibrillation, failed Tikosyn and amiodarone. Recent visit with Dr. Caryl Comes was in October at which time strategy of heart rate control and anticoagulation was pursued.  Eliquis has been restarted Still has increased HR with ambulation . He has been on amiodarone in the past but it was stopped due to inability to convert to NSR.   It may be helpful in slowing his HR. Will start amio 400 BID and see how he does.   2. Acute on chronic systolic heart failure, LVEF 25-30%, decreased from 50% in 2013.   No clear etology .  Cath showed minimal coronary irreg.  On metoprolol  He was very dry at the time of the cath.   I think he will feel better with some rehydration .   4. History of hypothyroidism, recent TSH 6.8.  5. Essential hypertension, blood pressures have been low and limiting medical therapy to some degree.  6. Acute renal insufficiency, creatinine has improved slightly   Ambulating with PT.    Nahser, Wonda Cheng, MD  11/11/2015 10:48 AM    [address],  [place], [place]  [address] Pager [phone] Phone: [phone]; Fax: [phone]   [facility]  [address] [place], [place]  [address] [phone]   Fax [phone]

## 2015-11-11 NOTE — Progress Notes (Signed)
Physical Therapy Treatment Patient Details Name: [name] MRN: [id number] DOB: [birth year] Today's Date: 11/11/2015    History of Present Illness Patient is an 81 y.o. male with hx of GERD, GI bleed, PAF, BPH, DM, CAD, HTN, sarcoma (tricep), cholangiocarcinoma, Szr, and Vtachwho presents with SOB, R pleural effusion and CHF. s/p cardiac cath. CXR-Increasing RIGHT lower lobe airspace disease and small effusions concerning for PNA.    PT Comments    Patient progressing well towards PT goals. Improved ambulation distance today. Pt continues to have elevated HR to 142 bpm with ambulation. Longer rest breaks due to HR. Endurance seems to be improving. Encouraged ambulation 2 more times today with RN. Will follow acutely.    Follow Up Recommendations  Home health PT;Supervision - Intermittent     Equipment Recommendations  None recommended by PT    Recommendations for Other Services       Precautions / Restrictions Precautions Precautions: Fall Precaution Comments: monitor HR Restrictions Weight Bearing Restrictions: No    Mobility  Bed Mobility               General bed mobility comments: Pt sitting in chair upon PT arrival.   Transfers Overall transfer level: Needs assistance Equipment used: Rolling walker (2 wheeled) Transfers: Sit to/from Stand Sit to Stand: Min guard         General transfer comment: Min guard for safety. Stood from chair x1, from [employer].   Ambulation/Gait Ambulation/Gait assistance: Min guard Ambulation Distance (Feet): 300 Feet (+ 125') Assistive device: Rolling walker (2 wheeled) Gait Pattern/deviations: Step-through pattern;Decreased stride length;Trunk flexed Gait velocity: decreased   General Gait Details: Slow, mildly unsteady gait. Requires use of RW for support. Pt in A-fib, ranged from 103-142 bpm. 1 longer seated rest break.    Stairs            Wheelchair Mobility    Modified Rankin (Stroke Patients Only)        Balance Overall balance assessment: Needs assistance Sitting-balance support: Feet supported;No upper extremity supported Sitting balance-Leahy Scale: Good     Standing balance support: During functional activity Standing balance-Leahy Scale: Fair                      Cognition Arousal/Alertness: Awake/alert Behavior During Therapy: WFL for tasks assessed/performed Overall Cognitive Status: Within Functional Limits for tasks assessed                      Exercises      General Comments General comments (skin integrity, edema, etc.): Discussed energy conservation techniques and safety at home.       Pertinent Vitals/Pain Pain Assessment: No/denies pain    Home Living                      Prior Function            PT Goals (current goals can now be found in the care plan section) Progress towards PT goals: Progressing toward goals    Frequency  Min 3X/week    PT Plan Current plan remains appropriate    Co-evaluation             End of Session Equipment Utilized During Treatment: Gait belt Activity Tolerance: Treatment limited secondary to medical complications (Comment) (elevated HR in A-fib) Patient left: in chair;with call bell/phone within reach     Time: 0919-0938 PT Time Calculation (min) (ACUTE ONLY): 19 min  Charges:  $  Gait Training: 8-22 mins                    G Codes:      Delrico Minehart A Toryn Mcclinton 11/11/2015, 10:03 AM Wray Kearns, PT, DPT [phone]

## 2015-11-11 NOTE — Progress Notes (Signed)
Speech Language Pathology Treatment: Dysphagia  Patient Details Name: SHANNA RUGAR MRN: [id number] DOB: Jun 05, 1925 Today's Date: 11/11/2015 Time: [id number] SLP Time Calculation (min) (ACUTE ONLY): 13 min  Assessment / Plan / Recommendation Clinical Impression  Pt seen to reinforce recommended precautions. Pt recalls MBS, but reports "everything was fine." SLP reiterated impaired swallow findings and related findings to pts observations of something in his throat. Repeatedly SLP had to reinforce importance of swallow strategies. With moderate verbal cues pt was able to demonstrate multiple hard swallows with thin liquids x5. Also reinforced with RN and indicated visual reminders again. Pt will need home health f/u as aspiration risk will be persistent and pt needs further reinforcement for carry over. Home exercise program could be beneficial.    HPI HPI: Hance Rensel is a 81 y.o. male, with permanent atrial fibrillation on Eliquis, diabetes mellitus, seizures, and history of Whipple procedure who presents to the emergency department with shortness of breath. Mr. Jayson saw his primary care physician this morning for cough and shortness of breath and was sent to the emergency department after an x-ray showed a large right pleural effusion. Mr. Colglazier reports that he has been feeling fatigued and badly for about the past month. But over the last week he has become much worse. He is no longer able to walk his daily 1/2 mile. He is unable to sleep. He has a dry cough but occasionally brings up frothy sputum. He also coughs when he eats or drinks liquids,       SLP Plan  Continue with current plan of care     Recommendations  Diet recommendations: Regular;Thin liquid Liquids provided via: Cup Medication Administration: Whole meds with liquid Supervision: Patient able to self feed Compensations: Slow rate;Small sips/bites;Clear throat intermittently;Effortful swallow Postural Changes and/or  Swallow Maneuvers: Seated upright 90 degrees;Upright 30-60 min after meal              Oral Care Recommendations: Oral care BID Follow up Recommendations: Home health SLP Plan: Continue with current plan of care  [facility] [place] CCC-SLP [id number]  Lynann Beaver 11/11/2015, 9:16 AM

## 2015-11-12 ENCOUNTER — Encounter (HOSPITAL_COMMUNITY): Payer: Self-pay | Admitting: Physician Assistant

## 2015-11-12 ENCOUNTER — Encounter: Payer: Self-pay | Admitting: Physician Assistant

## 2015-11-12 LAB — URINALYSIS, ROUTINE W REFLEX MICROSCOPIC
BILIRUBIN URINE: NEGATIVE
Glucose, UA: NEGATIVE mg/dL
KETONES UR: NEGATIVE mg/dL
LEUKOCYTES UA: NEGATIVE
NITRITE: NEGATIVE
PROTEIN: NEGATIVE mg/dL
Specific Gravity, Urine: 1.022 (ref 1.005–1.030)
pH: 5.5 (ref 5.0–8.0)

## 2015-11-12 LAB — BASIC METABOLIC PANEL
ANION GAP: 8 (ref 5–15)
BUN: 26 mg/dL — AB (ref 6–20)
CALCIUM: 8.4 mg/dL — AB (ref 8.9–10.3)
CO2: 27 mmol/L (ref 22–32)
Chloride: 100 mmol/L — ABNORMAL LOW (ref 101–111)
Creatinine, Ser: 1.17 mg/dL (ref 0.61–1.24)
GFR calc Af Amer: >60 mL/min (ref >60)
GFR, EST NON AFRICAN AMERICAN: 54 mL/min — AB (ref >60)
GLUCOSE: 155 mg/dL — AB (ref 65–99)
Potassium: 4.2 mmol/L (ref 3.5–5.1)
SODIUM: 135 mmol/L (ref 135–145)

## 2015-11-12 LAB — GLUCOSE, CAPILLARY
GLUCOSE-CAPILLARY: 143 mg/dL — AB (ref 65–99)
Glucose-Capillary: 132 mg/dL — ABNORMAL HIGH (ref 65–99)

## 2015-11-12 LAB — URINE MICROSCOPIC-ADD ON

## 2015-11-12 LAB — MAGNESIUM: MAGNESIUM: 2 mg/dL (ref 1.7–2.4)

## 2015-11-12 MED ORDER — AMIODARONE HCL 400 MG PO TABS: 400.0000 mg | ORAL_TABLET | Freq: Two times a day (BID) | ORAL | Status: DC

## 2015-11-12 NOTE — Progress Notes (Signed)
Patient: [name] / Admit Date: 11/04/2015 / Date of Encounter: 11/12/2015, 9:48 AM   Subjective: Feeling pretty good today. No CP or SOB. Had urinary retention last night.   Objective: Telemetry: persistent AF - rates 80s this AM, occ spikes to 140s. 6 beats NSVT overnight Physical Exam: Blood pressure 95/80, pulse 88, temperature 98 F (36.7 C), temperature source Oral, resp. rate 18, height 6\' 2"  (1.88 m), weight 164 lb 3.9 oz (74.5 kg), SpO2 93 %. General: Well developed pale elderly WM in no acute distress. Head: Normocephalic, atraumatic, sclera non-icteric, no xanthomas, nares are without discharge. Neck: JVP not elevated. Lungs: Slightly decreased BS right base. Otherwise clear to auscultation without wheezes, rales, or rhonchi. Breathing is unlabored. Heart: Irregularly irregular, S1 S2 without murmurs, rubs, or gallops.  Abdomen: Soft, non-tender, non-distended with normoactive bowel sounds. No rebound/guarding. Extremities: No clubbing or cyanosis. No edema. Distal pedal pulses are 2+ and equal bilaterally. Neuro: Alert and oriented X 3. Moves all extremities spontaneously. Psych:  Responds to questions appropriately with a normal affect.   Intake/Output Summary (Last 24 hours) at 11/12/15 0948 Last data filed at 11/12/15 0500  Gross per 24 hour  Intake    720 ml  Output    400 ml  Net    320 ml    Inpatient Medications:  . amiodarone  400 mg Oral BID  . apixaban  2.5 mg Oral BID  . azelastine  2 spray Each Nare BID  . cetaphil   Topical Daily  . digoxin  0.25 mg Intravenous Once  . digoxin  0.125 mg Oral Daily  . finasteride  5 mg Oral Daily  . insulin aspart  0-9 Units Subcutaneous TID WC  . insulin aspart  3 Units Subcutaneous TID WC  . levofloxacin  750 mg Oral Q48H  . lipase/protease/amylase  12,000 Units Oral TID AC  . metoprolol tartrate  25 mg Oral BID  . multivitamin with minerals  1 tablet Oral Daily  . saccharomyces boulardii  250 mg Oral Daily   . sodium chloride  1 spray Each Nare BID  . sodium chloride  3 mL Intravenous Q12H  . sodium chloride  3 mL Intravenous Q12H  . sodium chloride  3 mL Intravenous Q12H  . tamsulosin  0.4 mg Oral BID   Infusions:  . sodium chloride 10 mL/hr at 11/09/15 0700    Labs:  Recent Labs  11/12/15 0645  NA 135  K 4.2  CL 100*  CO2 27  GLUCOSE 155*  BUN 26*  CREATININE 1.17  CALCIUM 8.4*   No results for input(s): AST, ALT, ALKPHOS, BILITOT, PROT, ALBUMIN in the last 72 hours. No results for input(s): WBC, NEUTROABS, HGB, HCT, MCV, PLT in the last 72 hours. No results for input(s): CKTOTAL, CKMB, TROPONINI in the last 72 hours. Invalid input(s): POCBNP No results for input(s): HGBA1C in the last 72 hours.   Radiology/Studies:  Dg Chest 1 View  11/06/2015  CLINICAL DATA:  Post right thoracentesis EXAM: CHEST 1 VIEW COMPARISON:  11/05/2015 FINDINGS: Cardiomediastinal silhouette is stable. The right pleural effusion has resolved. Mild bilateral basilar atelectasis left greater than right. Right hilar catheter is stable in position. No segmental infiltrate or pulmonary edema. No pneumothorax. IMPRESSION: The right pleural effusion has resolved. Mild bilateral basilar atelectasis left greater than right. Right hilar catheter is stable in position. No segmental infiltrate or pulmonary edema. No pneumothorax. Electronically Signed   By: Lahoma Crocker M.D.   On: 11/06/2015  17:13   Dg Chest 1 View  11/04/2015  CLINICAL DATA:  Initial encounter for status post right thoracentesis. EXAM: CHEST 1 VIEW COMPARISON:  11/04/2015, earlier the same day. FINDINGS: Interval decrease in right pleural effusion no evidence for pneumothorax. Stable appearance of catheter tubing over the medial right hemi thorax and heart silhouette. Left lung remains clear. The cardio pericardial silhouette is enlarged. Telemetry leads overlie the chest. IMPRESSION: No evidence for pneumothorax status post thoracentesis.  Electronically Signed   By: Misty Stanley M.D.   On: 11/04/2015 16:56   Dg Chest 2 View  11/05/2015  CLINICAL DATA:  History of recent thoracentesis forl reduction of right pleural effusion, cough and congestion EXAM: CHEST  2 VIEW COMPARISON:  Portable chest x-ray of 11/04/2015 FINDINGS: After right thoracentesis, there is little change in the small right pleural effusion remaining with right basilar atelectasis. The left lung is clear. Cardiomegaly is stable. No pneumothorax is seen. IMPRESSION: No significant change after right thoracentesis in the volume of the right pleural effusion with right basilar atelectasis. Electronically Signed   By: Ivar Drape M.D.   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 EXAM: CHEST  2 VIEW COMPARISON:  PA and lateral chest x-ray of June 22, 2013 FINDINGS: There is a new moderate-sized right pleural effusion occupying 1/2 of the right hemithorax. Right basilar atelectasis or pneumonia may be obscured. There is small amount of atelectasis or infiltrate at the left lung base medially. A trace of pleural fluid on the left is suspected. The cardiac silhouette is mildly enlarged. The pulmonary vascularity is normal. The bony thorax exhibits no acute abnormality. IMPRESSION: Since the previous study the patient has developed a moderate sized right pleural effusion. This may be obscuring right basilar atelectasis, pneumonia, or mass. There is a trace left pleural effusion and minimal left basilar atelectasis or infiltrate. There is no evidence of CHF. Chest CT scanning is recommended. Electronically Signed   By: David  Martinique M.D.   On: 11/04/2015 11:30   Ct Angio Chest Pe W/cm &/or Wo Cm  11/05/2015  CLINICAL DATA:  Shortness of breath, pleural effusion. History of sarcoma, head thoracentesis yesterday EXAM:  CT ANGIOGRAPHY CHEST WITH CONTRAST TECHNIQUE: Multidetector CT imaging of the chest was performed using the standard protocol during bolus administration of intravenous contrast. Multiplanar CT image reconstructions and MIPs were obtained to evaluate the vascular anatomy. CONTRAST:  88mL OMNIPAQUE IOHEXOL 350 MG/ML SOLN COMPARISON:  CT abdomen 12/16/ 15 images of the lung bases FINDINGS: Sagittal images of the spine shows osteopenia and degenerative changes thoracic spine. Sagittal view of the sternum is unremarkable. Atherosclerotic calcifications of thoracic aorta and coronary arteries. The study is of excellent technical quality. No pulmonary embolus is noted. Bilateral perihilar peribronchial thickening is noted. There is no mediastinal hematoma or adenopathy. Images of the upper abdomen shows partially visualized postsurgical changes post Whipple procedure. There is moderate size right pleural effusion. Small left pleural effusion. There is atelectasis in left lower lobe posteriorly. There is patchy airspace disease/infiltrate in right middle lobe and right lower lobe. Findings are highly suspicious for pneumonia. There is a catheter fragment in main pulmonary artery extending from just above the origin in right main pulmonary artery and a small branch. The catheter measures at least 12.5 cm in length. This was only partially visualized  on prior exam stable in position from prior exam. Review of the MIP images confirms the above findings. IMPRESSION: 1. No pulmonary embolus. 2. There is moderate size right pleural effusion. Small left pleural effusion. Atelectasis noted in left lower lobe posteriorly. There is consolidation with air bronchogram in right lower lobe and right middle lobe highly suspicious for infiltrate/pneumonia. 3. Mild perihilar bronchitic changes. 4. No mediastinal hematoma or adenopathy. 5. There is a catheter fragment in main pulmonary artery extending from just above the origin in right  main pulmonary artery and a small branch. The catheter measures at least 12.5 cm in length. This was only partially visualized on prior exam stable in position from prior exam. These results were called by telephone at the time of interpretation on 11/05/2015 at 1:57 pm to Dr. Reyne Dumas , who verbally acknowledged these results. Electronically Signed   By: Lahoma Crocker M.D.   On: 11/05/2015 14:02   Dg Chest Port 1 View  11/09/2015  CLINICAL DATA:  Short of breath, chest pain EXAM: PORTABLE CHEST 1 VIEW COMPARISON:  Radiograph 11/06/2015, 10/30/2012 FINDINGS: There is a catheter in the RIGHT main pulmonary artery unchanged from 10/30/2012. Normal cardiac silhouette. There is increased airspace density in the RIGHT lower lobe. Small RIGHT effusion. No pneumothorax. IMPRESSION: Increasing RIGHT lower lobe airspace disease and small effusions concerning for pneumonia Electronically Signed   By: Suzy Bouchard M.D.   On: 11/09/2015 14:45   Dg Swallowing Func-speech Pathology  11/06/2015  Objective Swallowing Evaluation:   Patient Details Name: AYRON FLORANCE MRN: [id number] Date of Birth: [birth year] Today's Date: 11/06/2015 Time: SLP Start Time (ACUTE ONLY): 1009-SLP Stop Time (ACUTE ONLY): 1020 SLP Time Calculation (min) (ACUTE ONLY): 11 min Past Medical History: Past Medical History Diagnosis Date . Acid reflux disease    history of . H/O: GI bleed  . Paroxysmal atrial fibrillation (HCC)    chronic anticoag; tikosyn . Benign prostatic hypertrophy    history of . Hepatic damage march 2009   retained hepatic stone , intrahepatic duct catheter . CAD (coronary artery disease)  . Hypertension  . 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 Seizures ([place])  . Ventricular tachycardia ? Tikosyn proarrhtyhmia  . Cataracts, bilateral  . Diabetes mellitus    insulin dep . Pleural effusion  10/2015 . Shortness of breath dyspnea  Past Surgical History: Past Surgical History Procedure Laterality Date . Cardioversion   . Cholecystectomy     history of . Partial gastrectomy     history of . Shoulder surgery     left shoulder repair with 2 pens inserted . Whipple procedure     operation . 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 Inguinal hernia repair     inguinal herniorrhaphy, right... history of . Appendectomy     history of . Cardioversion  03/07/2012   Procedure: CARDIOVERSION;  Surgeon: Deboraha Sprang, MD;  Location: [place];  Service: Cardiovascular;  Laterality: N/A; . Cardioversion N/A 08/10/2015   Procedure: CARDIOVERSION;  Surgeon: Thayer Headings, MD;  Location: [facility] ENDOSCOPY;  Service: Cardiovascular;  Laterality: N/A; HPI: Jearld Romberg is a 81 y.o. male, with permanent atrial fibrillation on Eliquis, diabetes mellitus, seizures, and history of Whipple procedure who presents to the emergency department with shortness of breath. Mr. Dembek saw his primary care physician for cough and shortness  of breath and was sent to the emergency department after an x-ray showed a large right pleural effusion. Mr. Dostal reports that he has been feeling fatigued and badly for about the past month. But over the last week he has become much worse. He is no longer able to walk his daily 1/2 mile. He is unable to sleep. He has a dry cough but occasionally brings up frothy sputum. He also coughs when he eats or drinks liquids,  No Data Recorded Assessment / Plan / Recommendation CHL IP CLINICAL IMPRESSIONS 11/06/2015 Therapy Diagnosis Moderate pharyngeal phase dysphagia Clinical Impression Pt presents with moderate pharyngeal phase dysphagia characterized by one incidence of flash penetration with larger boluses of liquids, but clearance noted without strategies utilized; pt also exhibited moderate vallecular residue with puree-solid consistencies which required multiple repetitive swallows, effortful swallows and  alternating solids/liquids to clear valleculae which place him at a moderate aspiration risk without use of strategies during po intake; decreased epiglottic inversion noted intermittently during study as well which also increases his aspiration risk.  Pt did not aspirate any consistency, but is certainly at risk if strategies are not utilized during po intake; ST to f/u during hospitalization for diet tolerance/pt education re: aspiration risk/swallowing strategy use and should be f/u with SLP at SNF. Impact on safety and function Moderate aspiration risk   CHL IP TREATMENT RECOMMENDATION 11/06/2015 Treatment Recommendations Therapy as outlined in treatment plan below   Prognosis 11/06/2015 Prognosis for Safe Diet Advancement Good Barriers to Reach Goals -- Barriers/Prognosis Comment -- CHL IP DIET RECOMMENDATION 11/06/2015 SLP Diet Recommendations Regular solids;Thin liquid Liquid Administration via Cup Medication Administration Whole meds with liquid Compensations Slow rate;Small sips/bites;Clear throat intermittently;Effortful swallow Postural Changes Seated upright at 90 degrees   CHL IP OTHER RECOMMENDATIONS 11/06/2015 Recommended Consults -- Oral Care Recommendations Oral care BID Other Recommendations --   CHL IP FOLLOW UP RECOMMENDATIONS 11/05/2015 Follow up Recommendations ST f/u at SNF upon d/c   CHL IP FREQUENCY AND DURATION 11/06/2015 Speech Therapy Frequency (ACUTE ONLY) min 2x/week Treatment Duration 1 week      CHL IP ORAL PHASE 11/06/2015 Oral Phase WFL Oral - Pudding Teaspoon -- Oral - Pudding Cup -- Oral - Honey Teaspoon -- Oral - Honey Cup -- Oral - Nectar Teaspoon -- Oral - Nectar Cup -- Oral - Nectar Straw -- Oral - Thin Teaspoon -- Oral - Thin Cup -- Oral - Thin Straw -- Oral - Puree -- Oral - Mech Soft -- Oral - Regular -- Oral - Multi-Consistency -- Oral - Pill -- Oral Phase - Comment --  CHL IP PHARYNGEAL PHASE 11/06/2015 Pharyngeal Phase Impaired Pharyngeal- Pudding Teaspoon -- Pharyngeal --  Pharyngeal- Pudding Cup -- Pharyngeal -- Pharyngeal- Honey Teaspoon -- Pharyngeal -- Pharyngeal- Honey Cup -- Pharyngeal -- Pharyngeal- Nectar Teaspoon -- Pharyngeal -- Pharyngeal- Nectar Cup -- Pharyngeal -- Pharyngeal- Nectar Straw -- Pharyngeal -- Pharyngeal- Thin Teaspoon -- Pharyngeal -- Pharyngeal- Thin Cup Penetration/Aspiration during swallow;Other (Comment) Pharyngeal (No Data) Pharyngeal- Thin Straw -- Pharyngeal -- Pharyngeal- Puree Reduced epiglottic inversion;Pharyngeal residue - valleculae;Compensatory strategies attempted (with notebox) Pharyngeal -- Pharyngeal- Mechanical Soft -- Pharyngeal -- Pharyngeal- Regular Reduced epiglottic inversion;Pharyngeal residue - valleculae;Compensatory strategies attempted (with notebox) Pharyngeal -- Pharyngeal- Multi-consistency -- Pharyngeal -- Pharyngeal- Pill -- Pharyngeal -- Pharyngeal Comment --  CHL IP CERVICAL ESOPHAGEAL PHASE 11/06/2015 Cervical Esophageal Phase WFL Pudding Teaspoon -- Pudding Cup -- Honey Teaspoon -- Honey Cup -- Nectar Teaspoon -- Nectar Cup -- Nectar Straw -- Thin Teaspoon -- Thin  Cup -- Thin Straw -- Puree -- Mechanical Soft -- Regular -- Multi-consistency -- Pill -- Cervical Esophageal Comment -- ADAMS,PAT, M.S., CCC-SLP 11/06/2015, 10:31 AM              US Thoracentesis Asp Pleural Space W/img Guide  11/06/2015  Ardis Rowan, PA-C     11/06/2015  4:37 PM Successful US guided right thoracentesis. Yielded 1.6 liters of clear yellow fluid. Pt tolerated procedure well. No immediate complications. Specimen was not sent for labs. CXR ordered. Abigail Butts S BLAIR PA-C 11/06/2015 4:36 PM   US Thoracentesis Asp Pleural Space W/img Guide  11/04/2015  INDICATION: Symptomatic right sided pleural effusion EXAM: US THORACENTESIS ASP PLEURAL SPACE W/IMG GUIDE COMPARISON:  CXR 11/04/2015. MEDICATIONS: None COMPLICATIONS: None immediate TECHNIQUE: Informed written consent was obtained from the patient after a discussion of the risks, benefits and  alternatives to treatment. A timeout was performed prior to the initiation of the procedure. Initial ultrasound scanning demonstrates a right pleural effusion. The lower chest was prepped and draped in the usual sterile fashion. 1% lidocaine was used for local anesthesia. An ultrasound image was saved for documentation purposes. A 6 Fr Safe-T-Centesis catheter was introduced. The thoracentesis was performed. The catheter was removed and a dressing was applied. The patient tolerated the procedure well without immediate post procedural complication. The patient was escorted to have an upright chest radiograph. FINDINGS: A total of approximately 1.4 Liters of yellow fluid was removed. Requested samples were sent to the laboratory. IMPRESSION: Successful ultrasound-guided right sided thoracentesis yielding 1.4 liters of pleural fluid. Read By:  Tsosie Billing PA-C Electronically Signed   By: Lucrezia Europe M.D.   On: 11/04/2015 16:31     Assessment and Plan  5M with persistent AF (failed Tikosyn, on Eliquis -> d/p DCCV 07/2015 that did not hold; on rate control as of 08/2015), DM, HTN, HLD, biliary CA s/p Whipple, possible CKD stage II admitted 11/04/2015 for cough, SOB and was diagnosed with pleural effusion s/p thoracentesis (12/7, 12/9) and CAP requiring abx. 2D echo 11/05/15 showed new LV dysfunction with EF 25-30% with diffuse HK, mild MR, mod TR/PR. R/LHC 11/09/15: mild nonobstructive CAD (NICM) with well- compensated hemodynamics with low filling pressures  1. Acute systolic CHF - LVEF [id number], decreased from 50% in 2013.NICM per cath results 11/09/15. No clear etiology (? r/t AF). S/p thoracenteses this admission - path 12/7 neg for malignancy. Diuretic remains on hold - was on drier side at cath, BP running lower. No ACEI/ARB due to renal issue, hypotension. Coreg changed to metoprolol for improved rate control.  2. Persistent atrial fibrillation - now on digoxin, metoprolol, and restarted on amiodarone  11/11/15 with much improved rate control. With 6 beats NSVT this AM will check Mg level.  3. HTN - softer BP's this admission, limiting medical therapy.  4. Acute kidney injury superimposed on probable CKD stage II - stable.  5. Abnormal TSH - per IM. Free T4 nl. TFTs will need to be followed since amio was restarted.  6. Nonobstructive CAD - conservative management.  7. Urinary retention - I sent message to nurse asking her to discuss patient's concern with primary team.  Signed, Melina Copa PA-C Pager: [phone]   Attending Note:   The patient was seen and examined.  Agree with assessment and plan as noted above.  Changes made to the above note as needed.  His HR is better on amiodarone.    He still has occasional episodes of tachycardia but I think  this is about as good as we are going to get.  DC to home today  per Int. Med.   I think that he would benefit from home PT / OT but will leave that decision up to Int. Med and the PT/OT dept here. A message has been sent to the Afib clinic Ceasar Lund) for follow up  He has an appt. With EP Dec. 28, 2016 at [place] Yazmin Locher, Brooke Bonito., MD, [facility] 11/12/2015, 10:02 AM [address],  [place] Pager [phone]

## 2015-11-12 NOTE — Progress Notes (Signed)
Order rcvd to discharge.  Telemetry removed and CCMD notified.  IV's removed with catheters intact.    Discharge education completed with Pt.  Emphasized need to weight daily and any weight increases to report to doctor.  Pt indicates understanding.  Pt discharged via [facility] with son-in-law.  Pt denies chest pain or sob.  Pt stable at discharge.

## 2015-11-12 NOTE — Discharge Summary (Signed)
Physician Discharge Summary  [name] MRN: [id number] DOB/AGE: 79-Apr-1926 79 y.o.  PCP: [name], MD   Admit date: 11/04/2015 Discharge date: 11/12/2015  Discharge Diagnoses:     Principal Problem:   Pleural effusion Active Problems:   Type II diabetes mellitus with manifestations (HCC)   Persistent atrial fibrillation (HCC)   CAD (coronary artery disease)   Hypothyroid   Acute on chronic heart failure (HCC)   S/P thoracentesis   SOB (shortness of breath)   CAP (community acquired pneumonia)   Acute on chronic systolic CHF (congestive heart failure) ([place])    Follow-up recommendations Follow-up with PCP in 3-5 days , including all  additional recommended appointments as below Follow-up CBC, CMP in 3-5 days He has an appt. With EP Dec. 28, 2016 at 8AM  Follow-up chest x-ray in 1-2 weeks for right pleural effusion Patient to be discharged with home health Follow up on cytology results for the right pleural effusion    Medication List    STOP taking these medications        carvedilol 6.25 MG tablet  Commonly known as:  COREG     LEVEMIR FLEXTOUCH 100 UNIT/ML Pen  Generic drug:  Insulin Detemir      TAKE these medications        amiodarone 400 MG tablet  Commonly known as:  PACERONE  Take 1 tablet (400 mg total) by mouth 2 (two) times daily.     AMLACTIN XL Lotn  Apply 1 application topically daily. Apply lotion to legs     apixaban 2.5 MG Tabs tablet  Commonly known as:  ELIQUIS  Take 1 tablet (2.5 mg total) by mouth 2 (two) times daily.     azelastine 0.1 % nasal spray  Commonly known as:  ASTELIN  Place 2 sprays into both nostrils 2 (two) times daily. Use in each nostril as directed     CENTRUM SILVER PO  Take 1 tablet by mouth daily.     [employer] GLUCOSAMINE-CHONDROITIN PO  Take 1 tablet by mouth daily. 1200/600 mg     digoxin 0.125 MG tablet  Commonly known as:  LANOXIN  Take 1 tablet (0.125 mg total) by mouth daily.     finasteride 5 MG  tablet  Commonly known as:  PROSCAR  Take 1 tablet (5 mg total) by mouth daily.     furosemide 20 MG tablet  Commonly known as:  LASIX  Take 1 tablet (20 mg total) by mouth every other day.     Insulin Pen Needle 31G X 5 MM Misc  Commonly known as:  B-D UF III MINI PEN NEEDLES  Inject 120 Devices into the skin 4 (four) times daily. Use a new needle for injecting insulin4 times a day     lipase/protease/amylase 12000 UNITS Cpep capsule  Commonly known as:  CREON  Take 1 capsule (12,000 Units total) by mouth 3 (three) times daily before meals.     NOVOLOG FLEXPEN 100 UNIT/ML FlexPen  Generic drug:  insulin aspart  Inject 4-8 Units into the skin 3 (three) times daily with meals. Inject 5 units subcutaneously with breakfast, 3 units with lunch and 5 units with supper     PROBIOTIC DAILY PO  Take 3 tablets by mouth daily. 3 tabs once a day with meals.     SALINE NA  Place 1 spray into both nostrils 2 (two) times daily.     tamsulosin 0.4 MG Caps capsule  Commonly known as:  FLOMAX  Take 1 capsule (0.4 mg total) by mouth 2 (two) times daily.     vitamin C 500 MG tablet  Commonly known as:  ASCORBIC ACID  Take 500 mg by mouth daily.         Discharge Condition: Stable   Discharge Instructions   Discharge Instructions    Diet - low sodium heart healthy    Complete by:  As directed      Diet - low sodium heart healthy    Complete by:  As directed      Diet - low sodium heart healthy    Complete by:  As directed      Increase activity slowly    Complete by:  As directed      Increase activity slowly    Complete by:  As directed      Increase activity slowly    Complete by:  As directed                Disposition: 01-Home or Self Care   Consults:  Cardiology  Significant Diagnostic Studies:  Dg Chest 1 View  11/06/2015  CLINICAL DATA:  Post right thoracentesis EXAM: CHEST 1 VIEW COMPARISON:  11/05/2015 FINDINGS: Cardiomediastinal silhouette is stable.  The right pleural effusion has resolved. Mild bilateral basilar atelectasis left greater than right. Right hilar catheter is stable in position. No segmental infiltrate or pulmonary edema. No pneumothorax. IMPRESSION: The right pleural effusion has resolved. Mild bilateral basilar atelectasis left greater than right. Right hilar catheter is stable in position. No segmental infiltrate or pulmonary edema. No pneumothorax. Electronically Signed   By: Lahoma Crocker M.D.   On: 11/06/2015 17:13   Dg Chest 1 View  11/04/2015  CLINICAL DATA:  Initial encounter for status post right thoracentesis. EXAM: CHEST 1 VIEW COMPARISON:  11/04/2015, earlier the same day. FINDINGS: Interval decrease in right pleural effusion no evidence for pneumothorax. Stable appearance of catheter tubing over the medial right hemi thorax and heart silhouette. Left lung remains clear. The cardio pericardial silhouette is enlarged. Telemetry leads overlie the chest. IMPRESSION: No evidence for pneumothorax status post thoracentesis. Electronically Signed   By: Misty Stanley M.D.   On: 11/04/2015 16:56   Dg Chest 2 View  11/05/2015  CLINICAL DATA:  History of recent thoracentesis forl reduction of right pleural effusion, cough and congestion EXAM: CHEST  2 VIEW COMPARISON:  Portable chest x-ray of 11/04/2015 FINDINGS: After right thoracentesis, there is little change in the small right pleural effusion remaining with right basilar atelectasis. The left lung is clear. Cardiomegaly is stable. No pneumothorax is seen. IMPRESSION: No significant change after right thoracentesis in the volume of the right pleural effusion with right basilar atelectasis. Electronically Signed   By: Ivar Drape M.D.   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 EXAM: CHEST  2 VIEW COMPARISON:  PA and  lateral chest x-ray of June 22, 2013 FINDINGS: There is a new moderate-sized right pleural effusion occupying 1/2 of the right hemithorax. Right basilar atelectasis or pneumonia may be obscured. There is small amount of atelectasis or infiltrate at the left lung base medially. A trace of pleural fluid on the left is suspected. The cardiac silhouette is mildly enlarged. The pulmonary vascularity is normal. The bony thorax exhibits no acute abnormality. IMPRESSION: Since the previous study the  patient has developed a moderate sized right pleural effusion. This may be obscuring right basilar atelectasis, pneumonia, or mass. There is a trace left pleural effusion and minimal left basilar atelectasis or infiltrate. There is no evidence of CHF. Chest CT scanning is recommended. Electronically Signed   By: David  Martinique M.D.   On: 11/04/2015 11:30   Ct Angio Chest Pe W/cm &/or Wo Cm  11/05/2015  CLINICAL DATA:  Shortness of breath, pleural effusion. 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 Multiplanar CT image reconstructions and MIPs were obtained to evaluate the vascular anatomy. CONTRAST:  47m OMNIPAQUE IOHEXOL 350 MG/ML SOLN COMPARISON:  CT abdomen 12/16/ 15 images of the lung bases FINDINGS: Sagittal images of the spine shows osteopenia and degenerative changes thoracic spine. Sagittal view of the sternum is unremarkable. Atherosclerotic calcifications of thoracic aorta and coronary arteries. The study is of excellent technical quality. No pulmonary embolus is noted. Bilateral perihilar peribronchial thickening is noted. There is no mediastinal hematoma or adenopathy. Images of the upper abdomen shows partially visualized postsurgical changes post Whipple procedure. There is moderate size right pleural effusion. Small left  pleural effusion. There is atelectasis in left lower lobe posteriorly. There is patchy airspace disease/infiltrate in right middle lobe and right lower lobe. Findings are highly suspicious for pneumonia. There is a catheter fragment in main pulmonary artery extending from just above the origin in right main pulmonary artery and a small branch. The catheter measures at least 12.5 cm in length. This was only partially visualized on prior exam stable in position from prior exam. Review of the MIP images confirms the above findings. IMPRESSION: 1. No pulmonary embolus. 2. There is moderate size right pleural effusion. Small left pleural effusion. Atelectasis noted in left lower lobe posteriorly. There is consolidation with air bronchogram in right lower lobe and right middle lobe highly suspicious for infiltrate/pneumonia. 3. Mild perihilar bronchitic changes. 4. No mediastinal hematoma or adenopathy. 5. There is a catheter fragment in main pulmonary artery extending from just above the origin in right main pulmonary artery and a small branch. The catheter measures at least 12.5 cm in length. This was only partially visualized on prior exam stable in position from prior exam. These results were called by telephone at the time of interpretation on 11/05/2015 at 1:57 pm to Dr. NReyne Dumas, who verbally acknowledged these results. Electronically Signed   By: LLahoma CrockerM.D.   On: 11/05/2015 14:02   Dg Chest Port 1 View  11/09/2015  CLINICAL DATA:  Short of breath, chest pain EXAM: PORTABLE CHEST 1 VIEW COMPARISON:  Radiograph 11/06/2015, 10/30/2012 FINDINGS: There is a catheter in the RIGHT main pulmonary artery unchanged from 10/30/2012. Normal cardiac silhouette. There is increased airspace density in the RIGHT lower lobe. Small RIGHT effusion. No pneumothorax. IMPRESSION: Increasing RIGHT lower lobe airspace disease and small effusions concerning for pneumonia Electronically Signed   By: SSuzy BouchardM.D.   On:  11/09/2015 14:45   Dg Swallowing Func-speech Pathology  11/06/2015  Objective Swallowing Evaluation:   Patient Details Name: LGEOVANNI RAHMINGMRN: [id number] of Birth: 6April 25, 1926Today's Date: 11/06/2015 Time: SLP Start Time (ACUTE ONLY): 1009-SLP Stop Time (ACUTE ONLY): 1020 SLP Time Calculation (min) (ACUTE ONLY): 11 min Past Medical History: Past Medical History Diagnosis Date . Acid reflux disease    history of . H/O:  GI bleed  . Paroxysmal atrial fibrillation (HCC)    chronic anticoag; tikosyn . Benign prostatic hypertrophy    history of . Hepatic damage march 2009   retained hepatic stone , intrahepatic duct catheter . CAD (coronary artery disease)  . Hypertension  . 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 Seizures ([place])  . Ventricular tachycardia ? Tikosyn proarrhtyhmia  . Cataracts, bilateral  . Diabetes mellitus    insulin dep . Pleural effusion 10/2015 . Shortness of breath dyspnea  Past Surgical History: Past Surgical History Procedure Laterality Date . Cardioversion   . Cholecystectomy     history of . Partial gastrectomy     history of . Shoulder surgery     left shoulder repair with 2 pens inserted . Whipple procedure     operation . 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 Inguinal hernia repair     inguinal herniorrhaphy, right... history of . Appendectomy     history of . Cardioversion  03/07/2012   Procedure: CARDIOVERSION;  Surgeon: Deboraha Sprang, MD;  Location: [facility];  Service: Cardiovascular;  Laterality: N/A; . Cardioversion N/A 08/10/2015   Procedure: CARDIOVERSION;  Surgeon: Thayer Headings, MD;  Location: [facility];  Service: Cardiovascular;  Laterality: N/A; HPI: [name] is a 81 y.o. male, with permanent atrial fibrillation on Eliquis, diabetes mellitus, seizures, and history of Whipple procedure who presents to the emergency department with  shortness of breath. Mr. Busbee saw his primary care physician for cough and shortness of breath and was sent to the emergency department after an x-ray showed a large right pleural effusion. Mr. Silveria reports that he has been feeling fatigued and badly for about the past month. But over the last week he has become much worse. He is no longer able to walk his daily 1/2 mile. He is unable to sleep. He has a dry cough but occasionally brings up frothy sputum. He also coughs when he eats or drinks liquids,  No Data Recorded Assessment / Plan / Recommendation CHL IP CLINICAL IMPRESSIONS 11/06/2015 Therapy Diagnosis Moderate pharyngeal phase dysphagia Clinical Impression Pt presents with moderate pharyngeal phase dysphagia characterized by one incidence of flash penetration with larger boluses of liquids, but clearance noted without strategies utilized; pt also exhibited moderate vallecular residue with puree-solid consistencies which required multiple repetitive swallows, effortful swallows and alternating solids/liquids to clear valleculae which place him at a moderate aspiration risk without use of strategies during po intake; decreased epiglottic inversion noted intermittently during study as well which also increases his aspiration risk.  Pt did not aspirate any consistency, but is certainly at risk if strategies are not utilized during po intake; ST to f/u during hospitalization for diet tolerance/pt education re: aspiration risk/swallowing strategy use and should be f/u with SLP at SNF. Impact on safety and function Moderate aspiration risk   CHL IP TREATMENT RECOMMENDATION 11/06/2015 Treatment Recommendations Therapy as outlined in treatment plan below   Prognosis 11/06/2015 Prognosis for Safe Diet Advancement Good Barriers to Reach Goals -- Barriers/Prognosis Comment -- CHL IP DIET RECOMMENDATION 11/06/2015 SLP Diet Recommendations Regular solids;Thin liquid Liquid Administration via Cup Medication Administration  Whole meds with liquid Compensations Slow rate;Small sips/bites;Clear throat intermittently;Effortful swallow Postural Changes Seated upright at 90 degrees   CHL IP OTHER RECOMMENDATIONS 11/06/2015 Recommended Consults -- Oral Care Recommendations Oral care BID  Other Recommendations --   CHL IP FOLLOW UP RECOMMENDATIONS 11/05/2015 Follow up Recommendations ST f/u at SNF upon d/c   CHL IP FREQUENCY AND DURATION 11/06/2015 Speech Therapy Frequency (ACUTE ONLY) min 2x/week Treatment Duration 1 week      CHL IP ORAL PHASE 11/06/2015 Oral Phase WFL Oral - Pudding Teaspoon -- Oral - Pudding Cup -- Oral - Honey Teaspoon -- Oral - Honey Cup -- Oral - Nectar Teaspoon -- Oral - Nectar Cup -- Oral - Nectar Straw -- Oral - Thin Teaspoon -- Oral - Thin Cup -- Oral - Thin Straw -- Oral - Puree -- Oral - Mech Soft -- Oral - Regular -- Oral - Multi-Consistency -- Oral - Pill -- Oral Phase - Comment --  CHL IP PHARYNGEAL PHASE 11/06/2015 Pharyngeal Phase Impaired Pharyngeal- Pudding Teaspoon -- Pharyngeal -- Pharyngeal- Pudding Cup -- Pharyngeal -- Pharyngeal- Honey Teaspoon -- Pharyngeal -- Pharyngeal- Honey Cup -- Pharyngeal -- Pharyngeal- Nectar Teaspoon -- Pharyngeal -- Pharyngeal- Nectar Cup -- Pharyngeal -- Pharyngeal- Nectar Straw -- Pharyngeal -- Pharyngeal- Thin Teaspoon -- Pharyngeal -- Pharyngeal- Thin Cup Penetration/Aspiration during swallow;Other (Comment) Pharyngeal (No Data) Pharyngeal- Thin Straw -- Pharyngeal -- Pharyngeal- Puree Reduced epiglottic inversion;Pharyngeal residue - valleculae;Compensatory strategies attempted (with notebox) Pharyngeal -- Pharyngeal- Mechanical Soft -- Pharyngeal -- Pharyngeal- Regular Reduced epiglottic inversion;Pharyngeal residue - valleculae;Compensatory strategies attempted (with notebox) Pharyngeal -- Pharyngeal- Multi-consistency -- Pharyngeal -- Pharyngeal- Pill -- Pharyngeal -- Pharyngeal Comment --  CHL IP CERVICAL ESOPHAGEAL PHASE 11/06/2015 Cervical Esophageal Phase WFL Pudding  Teaspoon -- Pudding Cup -- Honey Teaspoon -- Honey Cup -- Nectar Teaspoon -- Nectar Cup -- Nectar Straw -- Thin Teaspoon -- Thin Cup -- Thin Straw -- Puree -- Mechanical Soft -- Regular -- Multi-consistency -- Pill -- Cervical Esophageal Comment -- ADAMS,PAT, M.S., CCC-SLP 11/06/2015, 10:31 AM              US Thoracentesis Asp Pleural Space W/img Guide  11/06/2015  Ardis Rowan, PA-C     11/06/2015  4:37 PM Successful US guided right thoracentesis. Yielded 1.6 liters of clear yellow fluid. Pt tolerated procedure well. No immediate complications. Specimen was not sent for labs. CXR ordered. Abigail Butts S BLAIR PA-C 11/06/2015 4:36 PM   US Thoracentesis Asp Pleural Space W/img Guide  11/04/2015  INDICATION: Symptomatic right sided pleural effusion EXAM: US THORACENTESIS ASP PLEURAL SPACE W/IMG GUIDE COMPARISON:  CXR 11/04/2015. MEDICATIONS: None COMPLICATIONS: None immediate TECHNIQUE: Informed written consent was obtained from the patient after a discussion of the risks, benefits and alternatives to treatment. A timeout was performed prior to the initiation of the procedure. Initial ultrasound scanning demonstrates a right pleural effusion. The lower chest was prepped and draped in the usual sterile fashion. 1% lidocaine was used for local anesthesia. An ultrasound image was saved for documentation purposes. A 6 Fr Safe-T-Centesis catheter was introduced. The thoracentesis was performed. The catheter was removed and a dressing was applied. The patient tolerated the procedure well without immediate post procedural complication. The patient was escorted to have an upright chest radiograph. FINDINGS: A total of approximately 1.4 Liters of yellow fluid was removed. Requested samples were sent to the laboratory. IMPRESSION: Successful ultrasound-guided right sided thoracentesis yielding 1.4 liters of pleural fluid. Read By:  Tsosie Billing PA-C Electronically Signed   By: Lucrezia Europe M.D.   On: 11/04/2015 16:31       2-D echo LV EF: 25% -  30% Impressions:  - EF is reduced when compared to prior study (50%).  Cardiac catheterization Assessment:  1. Mild non-obstructive CAD 2. Nonischemic CM with EF 25% by echo 3. Chronic AF 4. Well-compensated hemodynamics with low filling pressures  Plan/Discussion:  Medical therapy. Restart Eliquis this evening   Filed Weights   11/10/15 0449 11/11/15 0504 11/12/15 0503  Weight: 75.615 kg (166 lb 11.2 oz) 75.5 kg (166 lb 7.2 oz) 74.5 kg (164 lb 3.9 oz)     Microbiology: Recent Results (from the past 240 hour(s))  Culture, body fluid-bottle     Status: None   Collection Time: 11/04/15  4:18 PM  Result Value Ref Range Status   Specimen Description FLUID PLEURAL RIGHT  Final   Special Requests BOTTLES DRAWN AEROBIC AND ANAEROBIC 10CC  Final   Culture NO GROWTH 5 DAYS  Final   Report Status 11/09/2015 FINAL  Final  Gram stain     Status: None   Collection Time: 11/04/15  4:18 PM  Result Value Ref Range Status   Specimen Description FLUID PLEURAL RIGHT  Final   Special Requests NONE  Final   Gram Stain   Final    WBC PRESENT, PREDOMINANTLY MONONUCLEAR NO ORGANISMS SEEN CYTOSPIN    Report Status 11/04/2015 FINAL  Final  MRSA PCR Screening     Status: None   Collection Time: 11/05/15  8:19 PM  Result Value Ref Range Status   MRSA by PCR NEGATIVE NEGATIVE Final    Comment:        The GeneXpert MRSA Assay (FDA approved for NASAL specimens only), is one component of a comprehensive MRSA colonization surveillance program. It is not intended to diagnose MRSA infection nor to guide or monitor treatment for MRSA infections.        Blood Culture    Component Value Date/Time   SDES FLUID PLEURAL RIGHT 11/04/2015 1618   SDES FLUID PLEURAL RIGHT 11/04/2015 1618   SPECREQUEST BOTTLES DRAWN AEROBIC AND ANAEROBIC 10CC 11/04/2015 1618   SPECREQUEST NONE 11/04/2015 1618   CULT NO GROWTH 5 DAYS 11/04/2015 1618   REPTSTATUS 11/09/2015  FINAL 11/04/2015 1618   REPTSTATUS 11/04/2015 FINAL 11/04/2015 1618      Labs: Results for orders placed or performed during the hospital encounter of 11/04/15 (from the past 48 hour(s))  Glucose, capillary     Status: Abnormal   Collection Time: 11/10/15 11:24 AM  Result Value Ref Range   Glucose-Capillary 165 (H) 65 - 99 mg/dL   Comment 1 Notify RN   Glucose, capillary     Status: Abnormal   Collection Time: 11/10/15  4:54 PM  Result Value Ref Range   Glucose-Capillary 126 (H) 65 - 99 mg/dL   Comment 1 Notify RN    Comment 2 Document in Chart   Glucose, capillary     Status: Abnormal   Collection Time: 11/10/15  9:07 PM  Result Value Ref Range   Glucose-Capillary 120 (H) 65 - 99 mg/dL  Glucose, capillary     Status: Abnormal   Collection Time: 11/11/15  6:33 AM  Result Value Ref Range   Glucose-Capillary 129 (H) 65 - 99 mg/dL   Comment 1 Notify RN    Comment 2 Document in Chart   Glucose, capillary     Status: Abnormal   Collection Time: 11/11/15 11:34 AM  Result Value Ref Range   Glucose-Capillary 147 (H) 65 - 99 mg/dL   Comment 1 Notify RN   Glucose, capillary     Status: None   Collection Time: 11/11/15  4:29 PM  Result Value Ref  Range   Glucose-Capillary 90 65 - 99 mg/dL   Comment 1 Notify RN    Comment 2 Document in Chart   Glucose, capillary     Status: Abnormal   Collection Time: 11/11/15  8:37 PM  Result Value Ref Range   Glucose-Capillary 155 (H) 65 - 99 mg/dL   Comment 1 Notify RN    Comment 2 Document in Chart   Basic metabolic panel     Status: Abnormal   Collection Time: 11/12/15  6:45 AM  Result Value Ref Range   Sodium 135 135 - 145 mmol/L   Potassium 4.2 3.5 - 5.1 mmol/L   Chloride 100 (L) 101 - 111 mmol/L   CO2 27 22 - 32 mmol/L   Glucose, Bld 155 (H) 65 - 99 mg/dL   BUN 26 (H) 6 - 20 mg/dL   Creatinine, Ser 1.17 0.61 - 1.24 mg/dL   Calcium 8.4 (L) 8.9 - 10.3 mg/dL   GFR calc non Af Amer 54 (L) >60 mL/min   GFR calc Af Amer >60 >60 mL/min     Comment: (NOTE) The eGFR has been calculated using the CKD EPI equation. This calculation has not been validated in all clinical situations. eGFR's persistently <60 mL/min signify possible Chronic Kidney Disease.    Anion gap 8 5 - 15  Magnesium     Status: None   Collection Time: 11/12/15  6:45 AM  Result Value Ref Range   Magnesium 2.0 1.7 - 2.4 mg/dL  Glucose, capillary     Status: Abnormal   Collection Time: 11/12/15  7:45 AM  Result Value Ref Range   Glucose-Capillary 143 (H) 65 - 99 mg/dL     Lipid Panel     Component Value Date/Time   CHOL 114 10/21/2015 1049   TRIG 59.0 10/21/2015 1049   TRIG 57 12/05/2006 1459   HDL 46.20 10/21/2015 1049   CHOLHDL 2 10/21/2015 1049   CHOLHDL 3.3 CALC 12/05/2006 1459   VLDL 11.8 10/21/2015 1049   LDLCALC 56 10/21/2015 1049     Lab Results  Component Value Date   HGBA1C 6.5 10/21/2015   HGBA1C 6.0 06/24/2015   HGBA1C 6.3 02/25/2015     Lab Results  Component Value Date   MICROALBUR <0.7 02/25/2015   LDLCALC 56 10/21/2015   CREATININE 1.17 11/12/2015     81 y.o. male, with permanent atrial fibrillation on Eliquis, diabetes mellitus, seizures, and history of Whipple procedure who presents to the emergency department with shortness of breath. Mr. Malina saw his primary care physician this morning for cough and shortness of breath and was sent to the emergency department after an x-ray showed a large right pleural effusion. Mr. Kohles reports that he has been feeling fatigued and badly for about the past month. But over the last week he has become much worse. He is no longer able to walk his daily 1/2 mile. He is unable to sleep. He has a dry cough but occasionally brings up frothy sputum. He also coughs when he eats or drinks liquids, +orthopnea, +PND, +insomnia. The patient reports he was taken off of amiodarone approximately 3 mos ago, and was told by his cardiologist, Dr. Caryl Comes, that his afib was going to be permanent.  Patient found to have persistent atrial fibrillation and acute on chronic systolic heart failure, status post thoracentesis on the right side 2, cardiac cath during this admission.   Assessment and plan Shortness of breath likely secondary to Acute systolic heart failure, LVEF 25-30%,  decreased from 50% in 2013.  Large Right-sided pleural effusion Appears to be transudative in nature, await cytology results , held Lasix in anticipation of cardiac cath and low blood pressure. S/p thoracentesis twice this admission.removed 1.4 liters fluid on 12/7, repeat thoracentesis on 12/9, removed 1.6 L. CT chest shows a moderate-sized right pleural effusion, questionable right lower lobe pneumonia, completed treatment with levofloxacin 8 days Chronic fragment from a catheter measuring 12.5 cm which was present on prior CT, patient states he has had it for 20 years Speech therapy consultation to rule out aspiration, started on regular diet and thin liquid Venous doppler No evidence of DVT, superficial thrombosis, or [name]'s Cyst    Acute on chronic systolic heart failure Patient appears in mild volume overload. + JVD, +1 bilateral lower extremity edema. Repeat 2-D echocardiogram shows EF of 25-30%. , Previously 50%, cardiology consulted  No ACE inhibitor as blood pressure soft. Coreg switched to metoprolol Continue lasix at 20 mg /every other day if tolerated due to low blood pressure  Hold off on adding ACE inhibitor or ARB as well with low blood pressure and allergy to lisinopril.  Follow BMP   Coronary artery calcifications noted by chest CT imaging. No prior history of obstructive CAD or myocardial infarction. Troponin I level 0.02 during this hospital stay. Cardiac cath done 12/12, normal coronaries .resume Eliquis   Persistent atrial fibrillation, uncontrolled   failed Tikosyn and amiodarone. Recent visit with Dr. Caryl Comes was in October at which time strategy of heart rate control and  anticoagulation was pursued.   Eliquis was held in anticipation of diagnostic cardiac catheterization on Monday as per Dr. Debara Pickett, resumed prior to discharge Very hard to control rate during this stay, Lanoxin was added to assist with rate control, patient also received 0.5 mg IV digoxin 12/10, also received iv diltiazem as needed  Coreg changed to metoprolol 25 bid by Dr Cathie Olden  , subsequently metoprolol was discontinued and patient was started on amiodarone Will DC home today as per cardiology      Type 2 diabetesCBG stable after discontinuation of Levemir, Levemir discontinued due to hypoglycemia.  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 Continue Creon supplementation.  Hypothyroidism Not currently on thyroid hormone supplementation? TSH 6.85. Free T4 within normal limits  Urinary retention Patient has a history of BPH on Flomax Continue Flomax cannot increased dose because of orthostatic hypotension UA to rule out UTI if negative patient will discharge home today  DVT prophylaxsis eliquis    Discharge Exam:    Blood pressure 95/80, pulse 88, temperature 98 F (36.7 C), temperature source Oral, resp. rate 18, height _0  (1.88 m), weight 74.5 kg (164 lb 3.9 oz), SpO2 93 %.  General: Elderly male, appears comfortable at rest.  HEENT: Conjunctiva and lids normal, all Cecille Rubin clear.  Lungs: Decreased breath sounds at the right base. No wheezing or labored breathing.  Cardiac: Irregularly irregular with soft apical systolic murmur. No elevated JVP.  Abdomen: NABS.  Extremities: No pitting edema.    Follow-up Information    Follow up with [name], MD. Schedule an appointment as soon as possible for a visit in 3 days.   Specialty:  Internal Medicine   Why:  pt to call to schedule for appointment    Contact information:   [address] [place] [address] [phone]       Follow up with Baldwin Jamaica, PA-C  On 11/25/2015.  Specialty:  Cardiology   Why:  [facility] - 11/25/15 at [place] information:   [place] [address] [phone]       Follow up with [facility].   Why:  physical therapy, occupational therapy, nurse aide   Contact information:   [place] [place] [address] [phone]       Follow up with CARROLL,DONNA, NP.   Specialties:  Nurse Practitioner, Cardiology   Why:  Office will call you for your followup appointment to be seen in 1 week. Call office if you have not heard back in 3 days.   Contact information:   [place] [address] [phone]       Signed: Reyne Dumas 11/12/2015, 10:40 AM        Time spent >45 mins

## 2015-11-12 NOTE — Care Management Important Message (Signed)
Important Message  Patient Details  Name: [name] MRN: [id number] Date of Birth: July 04, 1925   Medicare Important Message Given:  Yes    Louanne Belton 11/12/2015, 2:37 [place] Message  Patient Details  Name: [name] MRN: [id number] Date of Birth: [birth year]   Medicare Important Message Given:  Yes    Kalene Cutler G 11/12/2015, 2:37 PM

## 2015-11-12 NOTE — Progress Notes (Signed)
Patient had 6 beats of vtach, RN assessed patient is asymptomatic, VSS, no complaints at this time. Patient states he was up moving around.   Sharene Skeans, RN

## 2015-11-12 NOTE — Care Management Note (Signed)
Case Management Note  Patient Details  Name: [name] MRN: [id number] Date of Birth: [birth year]  Subjective/Objective:    Pt admitted with plueral effusions, underwent thoracentesis yesterday 11/04/15                Action/Plan:  Pt is independent with wife from [employer].  Pt stated he had a scale and will begin using it daily, pt stated he adheres to low sodium diet.  Pt denies difficulty with ambulating or performing ADLS.  CM will continue to monitor for disposition needs   Expected Discharge Date:                  Expected Discharge Plan:  Home/Self Care (Pt is from the Independent portion of [place] with wife)  In-House Referral:     Discharge planning Services     Post Acute Care Choice:    Choice offered to:  Patient  DME Arranged:    DME Agency:     HH Arranged:  PT, OT, Nurse's Aide, Speech Therapy HH Agency:  [facility]  Status of Service:  Complete, will sign off  Medicare Important Message Given:  Yes Date Medicare IM Given:    Medicare IM give by:    Date Additional Medicare IM Given:    Additional Medicare Important Message give by:     If discussed at [place] of Stay Meetings, dates discussed:    Additional Comments: 11/12/2015  CM informed Nathaneil Canary that [facility] speech therapy is also now recommended.  CM requested order from MD  11/10/15 Pt did not discharge yesterday as planned due to uncontrolled HR, medications have been modified, pt will need to monitored here in house to verify HR is controlled prior to discharge.   CM contacted [place] to verify that in house [facility] could provide ordered [place], CM informed that in house [facility] could not provide all services ordered, CM spoke with RN Debbie with [facility] program within community; CM was instructed that when services can not be provided in house, the preferred agency of choice is [place].  CM spoke with pt and pt is agreeable to agency.  CM  contacted agency and referral was accepted.   11/09/15 CM assessed pt prior to discharge, no CM needs determined  Maryclare Labrador, RN 11/12/2015, 10:51 AM

## 2015-11-13 ENCOUNTER — Telehealth: Payer: Self-pay | Admitting: Internal Medicine

## 2015-11-13 ENCOUNTER — Telehealth: Payer: Self-pay | Admitting: *Deleted

## 2015-11-13 ENCOUNTER — Other Ambulatory Visit: Payer: Self-pay | Admitting: Physician Assistant

## 2015-11-13 MED ORDER — METOPROLOL TARTRATE 25 MG PO TABS: 25.0000 mg | ORAL_TABLET | Freq: Two times a day (BID) | ORAL | Status: DC

## 2015-11-13 NOTE — Telephone Encounter (Signed)
I called and spoke with the patient. He had questions about his discharge medications. Questions were answered. The patient is scheduled to see his PCP on Monday and see Renee, PA in our office on 12/28.

## 2015-11-13 NOTE — Telephone Encounter (Signed)
Follow Up   Pt called. Was recently in the hospital and states that his medications were mixed up. Pt is requesting a call back to go over his list of medications

## 2015-11-13 NOTE — Telephone Encounter (Signed)
Transition Care Management Follow-up Telephone Call   Date discharged? 11/12/15   How have you been since you were released from the hospital? Called pt spoke with wife she states he is doing ok   Do you understand why you were in the hospital? YES   Do you understand the discharge instructions? YES   Where were you discharged to? Home   Items Reviewed:  Medications reviewed: YES  Allergies reviewed: YES  Dietary changes reviewed: NO  Referrals reviewed: NO   Functional Questionnaire:   Activities of Daily Living (ADLs):   she states he are independent in the following: ambulation, bathing and hygiene, feeding, continence, grooming, toileting and dressing States he doesn't require assistance    Any transportation issues/concerns?: NO   Any patient concerns? NO   Confirmed importance and date/time of follow-up visits scheduled YES, made 11/17/15  Provider Appointment booked with Dr. Ronnald Ramp  Confirmed with patient if condition begins to worsen call PCP or go to the ER.  Patient was given the office number and encouraged to call back with question or concerns.  : YES

## 2015-11-17 ENCOUNTER — Telehealth: Payer: Self-pay | Admitting: Internal Medicine

## 2015-11-17 ENCOUNTER — Inpatient Hospital Stay: Payer: Medicare Other | Admitting: Internal Medicine

## 2015-11-17 ENCOUNTER — Encounter: Payer: Self-pay | Admitting: Physician Assistant

## 2015-11-17 NOTE — Telephone Encounter (Signed)
Andee Poles, PT from [facility], called request verbal order for PT 2 time a week for 4 week. Please give her a call back  Phone # [phone]

## 2015-11-19 NOTE — Telephone Encounter (Signed)
LMOVM

## 2015-11-19 NOTE — Telephone Encounter (Signed)
LMVOM 

## 2015-11-20 ENCOUNTER — Other Ambulatory Visit: Payer: Self-pay

## 2015-11-20 NOTE — Telephone Encounter (Signed)
A user error has taken place.

## 2015-11-20 NOTE — Telephone Encounter (Signed)
3rd attempt

## 2015-11-20 NOTE — Telephone Encounter (Deleted)
Received incoming fax from [facility]. Ok to fill?

## 2015-11-24 NOTE — Telephone Encounter (Signed)
4th attempt

## 2015-11-25 ENCOUNTER — Encounter: Payer: Medicare Other | Admitting: Physician Assistant

## 2015-11-26 ENCOUNTER — Encounter: Payer: Self-pay | Admitting: Internal Medicine

## 2015-11-27 ENCOUNTER — Encounter: Payer: Self-pay | Admitting: Physician Assistant

## 2015-11-27 ENCOUNTER — Telehealth: Payer: Self-pay | Admitting: Internal Medicine

## 2015-11-27 ENCOUNTER — Ambulatory Visit (INDEPENDENT_AMBULATORY_CARE_PROVIDER_SITE_OTHER): Payer: Medicare Other | Admitting: Physician Assistant

## 2015-11-27 VITALS — BP 108/62 | HR 70 | Ht 74.0 in | Wt 175.0 lb

## 2015-11-27 DIAGNOSIS — I2510 Atherosclerotic heart disease of native coronary artery without angina pectoris: Secondary | ICD-10-CM | POA: Diagnosis not present

## 2015-11-27 DIAGNOSIS — I481 Persistent atrial fibrillation: Secondary | ICD-10-CM | POA: Diagnosis not present

## 2015-11-27 DIAGNOSIS — I4819 Other persistent atrial fibrillation: Secondary | ICD-10-CM

## 2015-11-27 DIAGNOSIS — I429 Cardiomyopathy, unspecified: Secondary | ICD-10-CM | POA: Diagnosis not present

## 2015-11-27 DIAGNOSIS — I420 Dilated cardiomyopathy: Secondary | ICD-10-CM

## 2015-11-27 DIAGNOSIS — I10 Essential (primary) hypertension: Secondary | ICD-10-CM | POA: Diagnosis not present

## 2015-11-27 DIAGNOSIS — I5023 Acute on chronic systolic (congestive) heart failure: Secondary | ICD-10-CM

## 2015-11-27 LAB — BASIC METABOLIC PANEL
BUN: 21 mg/dL (ref 7–25)
CHLORIDE: 100 mmol/L (ref 98–110)
CO2: 25 mmol/L (ref 20–31)
CREATININE: 1.24 mg/dL — AB (ref 0.70–1.11)
Calcium: 8.4 mg/dL — ABNORMAL LOW (ref 8.6–10.3)
GLUCOSE: 54 mg/dL — AB (ref 65–99)
POTASSIUM: 4.6 mmol/L (ref 3.5–5.3)
Sodium: 134 mmol/L — ABNORMAL LOW (ref 135–146)

## 2015-11-27 LAB — CBC
HEMATOCRIT: 36.6 % — AB (ref 39.0–52.0)
Hemoglobin: 13 g/dL (ref 13.0–17.0)
MCH: 34.2 pg — AB (ref 26.0–34.0)
MCHC: 35.5 g/dL (ref 30.0–36.0)
MCV: 96.3 fL (ref 78.0–100.0)
MPV: 10.2 fL (ref 8.6–12.4)
Platelets: 186 10*3/uL (ref 150–400)
RBC: 3.8 MIL/uL — ABNORMAL LOW (ref 4.22–5.81)
RDW: 14.3 % (ref 11.5–15.5)
WBC: 7.1 10*3/uL (ref 4.0–10.5)

## 2015-11-27 LAB — DIGOXIN LEVEL: Digoxin Level: 1.6 ug/L (ref 0.8–2.0)

## 2015-11-27 LAB — HEPATIC FUNCTION PANEL
ALBUMIN: 3 g/dL — AB (ref 3.6–5.1)
ALK PHOS: 130 U/L — AB (ref 40–115)
ALT: 22 U/L (ref 9–46)
AST: 32 U/L (ref 10–35)
BILIRUBIN DIRECT: 0.1 mg/dL (ref <=0.2)
BILIRUBIN TOTAL: 0.5 mg/dL (ref 0.2–1.2)
Indirect Bilirubin: 0.4 mg/dL (ref 0.2–1.2)
Total Protein: 6 g/dL — ABNORMAL LOW (ref 6.1–8.1)

## 2015-11-27 MED ORDER — LEVOTHYROXINE SODIUM 25 MCG PO TABS: 25.0000 ug | ORAL_TABLET | Freq: Every day | ORAL | Status: DC

## 2015-11-27 MED ORDER — AMIODARONE HCL 200 MG PO TABS: 200.0000 mg | ORAL_TABLET | Freq: Two times a day (BID) | ORAL | Status: AC

## 2015-11-27 MED ORDER — DIGOXIN 125 MCG PO TABS
0.1250 mg | ORAL_TABLET | ORAL | Status: DC
Start: 2015-11-27 — End: 2016-03-30

## 2015-11-27 NOTE — Patient Instructions (Addendum)
Medication Instructions:   START TAKING SYNTHROID 25 MCG ONCE A DAY   LASIX:  TAKE 20 MG ONCE A DAY FOR THREE DAYS THEN TAKE EVERY OTHER DAY   DIGOXIN: TAKE 0.125MG  ONCE EVERY OTHER DAY   AMIODARONE: TAKE 200 MG TWICE A DAY     If you need a refill on your cardiac medications before your next appointment, please call your pharmacy.  Labwork: CBC BMET TSH AND LFT DIGOXIN   Testing/Procedures: [facility].Marland KitchenFIRST LEVEL   . A chest x-ray takes a picture of the organs and structures inside the chest, including the heart, lungs, and blood vessels. This test can show several things, including, whether the heart is enlarges; whether fluid is building up in the lungs; and whether pacemaker / defibrillator leads are still in place.    Follow-Up: IN 2 WEEKS WITH DR Caryl Comes OR RENEE URSUY WHICHEVER ONE HAS OPEN AVAILABILTY   Any Other Special Instructions Will Be Listed Below (If Applicable).                                                                                                                                                  \

## 2015-11-27 NOTE — Telephone Encounter (Signed)
[name] from [facility] called and requesting verbal orders for OT 2x wk for 2 wks Can she use the breather for him for improving resperitory muscle strength and decrease shortness of breath. She can be reached at [phone]

## 2015-11-27 NOTE — Progress Notes (Signed)
Cardiology Office Note Date:  11/27/2015  Patient ID:  [name], [name] 09/26/1925, MRN [id number] PCP:  [name], MD   Electrophysiologist: Dr. Caryl Comes    Chief Complaint:  Post hospital evaluation  History of Present Illness: [name] is a 81 y.o. 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   Recently discharged from [facility] 11/12/15 with acute CHF, CAP, difficult rate to control with his AF and found with new CM with a large L pleural effusion requiring 2 thoras, d/c summary states, CT chest shows a moderate-sized right pleural effusion, questionable right lower lobe pneumonia, completed treatment with levofloxacin 8 days, Chronic fragment from a catheter measuring 12.5 cm which was present on prior CT, patient states he has had it for 20 years.  He was diuresed, rate was controlled and eventually discharged.  His coreg was changed to metoprolol, unable to tolerate ARB with soft BP and has allergy to lisinopril (cough), he had LHC without obstructive disease, his AF rate was difficult to control, he was put on metoprolol, then dig and eventually back on amiodarone.  He comes in today feeling like he is again with decreased exertional tolerance, getting winded/tired easily with ambulation and his home PT sent a note as well noticing he was SOB/coughingwith his session yesterday noting he had diminished BS on the R by the therapist.  The patient (and therapist) have noted steady weight gain since his discharge home, today at home without clothing 175lbs, his discharge weight was 164.  He has edema today 2+, reports at baseline he has some, maybe slightly less, but always has a component of LE swelling.  He has been unable  to see his PMD and have any labs or f/u until today. This being said he tells me today that while he does feel more tired/weak or unsteady and lack of stamina from a few months ago, all in all he feels stronger and significantly improved then he did the day he left the hospital, reporting that he is doing about 1/6th of a mile ambulating.  He will get winded with faster pace, but when he went home was walking very slowly with a walker and now a better pace with a cane only sometimes or when outside.  He denies any CP, no dizziness, near syncope or syncope, no palpitations, he does not perceive his AF.     Past Medical History  Diagnosis Date  . Acid reflux disease     history of  . H/O: GI bleed   . Persistent atrial fibrillation ([place])     a. Failed tikosyn. b. DCCV did not hold 07/2015.  Marland Kitchen Benign prostatic hypertrophy     history of  . Hepatic damage march 2009    retained hepatic stone , intrahepatic duct catheter  . CAD (coronary artery disease)     a. R/LHC 11/09/15: mild nonobstructive CAD (NICM) with well- compensated hemodynamics with low filling pressures.  . Hypertension   . 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 Seizures ([place])   . Ventricular tachycardia ? Tikosyn  proarrhtyhmia   . Cataracts, bilateral   . Diabetes mellitus     insulin dep  . Pleural effusion 10/2015  . Acute on chronic systolic CHF (congestive heart failure) ([place])   . CKD (chronic kidney disease), stage II     Past Surgical History  Procedure Laterality Date  . Cardioversion    . Cholecystectomy      history of  . Partial gastrectomy      history of  . Shoulder surgery      left shoulder repair with 2 pens inserted  . Whipple procedure      operation  . Sarcoma removal from rigth tricep    . Inguinal hernia repair      inguinal herniorrhaphy, right...  history of  . Appendectomy      history of  . Cardioversion  03/07/2012    Procedure: CARDIOVERSION;  Surgeon: Deboraha Sprang, MD;  Location: [facility];  Service: Cardiovascular;  Laterality: N/A;  . Cardioversion N/A 08/10/2015    Procedure: CARDIOVERSION;  Surgeon: Thayer Headings, MD;  Location: [facility] ENDOSCOPY;  Service: Cardiovascular;  Laterality: N/A;  . Cardiac catheterization N/A 11/09/2015    Procedure: Right/Left Heart Cath and Coronary Angiography;  Surgeon: Jolaine Artist, MD;  Location: [facility] CV LAB;  Service: Cardiovascular;  Laterality: N/A;    Current Outpatient Prescriptions  Medication Sig Dispense Refill  . apixaban (ELIQUIS) 2.5 MG TABS tablet Take 1 tablet (2.5 mg total) by mouth 2 (two) times daily. 60 tablet 2  . Ascorbic Acid (VITAMIN C) 500 MG tablet Take 500 mg by mouth daily.     Marland Kitchen azelastine (ASTELIN) 0.1 % nasal spray Place 2 sprays into both nostrils 2 (two) times daily. Use in each nostril as directed 90 mL 3  . CVS GLUCOSAMINE-CHONDROITIN PO Take 1 tablet by mouth daily. 1200/600 mg    . digoxin (LANOXIN) 0.125 MG tablet Take 1 tablet (0.125 mg total) by mouth every other day. 90 tablet 2  . Emollient (AMLACTIN XL) LOTN Apply 1 application topically daily. Apply lotion to legs    . finasteride (PROSCAR) 5 MG tablet Take 1 tablet (5 mg total) by mouth daily. 90 tablet 3  . furosemide (LASIX) 20 MG tablet Take 1 tablet (20 mg total) by mouth every other day. 45 tablet 3  . insulin aspart (NOVOLOG FLEXPEN) 100 UNIT/ML FlexPen Inject 4-8 Units into the skin 3 (three) times daily with meals. Inject 5 units subcutaneously with breakfast, 3 units with lunch and 5 units with supper    . Insulin Pen Needle (B-D UF III MINI PEN NEEDLES) 31G X 5 MM MISC Inject 120 Devices into the skin 4 (four) times daily. Use a new needle for injecting insulin4 times a day 360 each 3  . lipase/protease/amylase (CREON) 12000 UNITS CPEP capsule Take 1 capsule (12,000 Units total) by  mouth 3 (three) times daily before meals. 270 capsule 3  . metoprolol tartrate (LOPRESSOR) 25 MG tablet Take 1 tablet (25 mg total) by mouth 2 (two) times daily.    . Multiple Vitamins-Minerals (CENTRUM SILVER PO) Take 1 tablet by mouth daily.     . Probiotic Product (PROBIOTIC DAILY PO) Take 3 tablets by mouth daily. 3 tabs once a day with meals.    Marland Kitchen SALINE NA Place 1 spray into both nostrils 2 (two) times daily.     . tamsulosin (FLOMAX) 0.4 MG CAPS capsule Take 1 capsule (0.4 mg total) by mouth 2 (two) times daily. 180 capsule  3  . amiodarone (PACERONE) 200 MG tablet Take 1 tablet (200 mg total) by mouth 2 (two) times daily. 180 tablet 2  . levothyroxine (SYNTHROID, LEVOTHROID) 25 MCG tablet Take 1 tablet (25 mcg total) by mouth daily before breakfast. 30 tablet 5   No current facility-administered medications for this visit.    Allergies:   Lisinopril; Toujeo solostar; Clindamycin; Other; Oxycodone-acetaminophen; and Penicillins   Social History:  The patient  reports that he quit smoking about 24 years ago. His smoking use included Pipe. He has never used smokeless tobacco. He reports that he does not drink alcohol or use illicit drugs.   Family History:  The patient's family history includes Arthritis in his mother and sister; Cancer in his brother; Coronary artery disease in his brother and sister; Coronary artery disease (age of onset: 17) in his father; Diabetes in his sister; Fibromyalgia in his sister; Heart disease in his brother and father; Osteoarthritis (age of onset: 64) in his mother; Peripheral vascular disease (age of onset: 35) in his sister.  ROS:  Please see the history of present illness.  All other systems are reviewed and otherwise negative.   PHYSICAL EXAM:  VS:  BP 108/62 mmHg  Pulse 70  Ht 6\' 2"  (1.88 m)  Wt 175 lb (79.379 kg)  BMI 22.46 kg/m2 BMI: Body mass index is 22.46 kg/(m^2).  His O2 sat on RA is 96% Thin, elderly WM, in no acute distress, he looks pale  today, the patient and wife though don't feel he appears any different then usual. HEENT: normocephalic, atraumatic Neck: no JVD, carotid bruits or masses Cardiac:  normal S1, S2; RRR; no significant murmurs, no rubs, or gallops Lungs:  Diminished at the bases R>L, no crackles, no wheezing, rhonchi or rales Abd: soft, nontender MS: no deformity, age appropriate atrophy Ext: 2+ edema to mid-shin b/l Skin: warm and dry, no rash Neuro:  No gross deficits appreciated Psych: euthymic mood, full affect  EKG:  Done today shows AFib 57bpm  11/09/15 LHC 1. Mild non-obstructive CAD 2. Nonischemic CM with EF 25% by echo 3. Chronic AF 4. Well-compensated hemodynamics with low filling pressures  11/05/15: Echocardiogram Study Conclusions - Left ventricle: The cavity size was normal. Systolic function was severely reduced. The estimated ejection fraction was in the range of 25% to 30%. Diffuse hypokinesis. - Aortic valve: Trileaflet; mildly thickened, mildly calcified leaflets. Cusp separation was reduced. - Aorta: The aorta was moderately calcified. - Mitral valve: Moderately calcified annulus. There was mild regurgitation. - Left atrium: The atrium was moderately dilated. - Right ventricle: The cavity size was moderately dilated. Wall thickness was normal. Systolic function was moderately reduced. - Right atrium: The atrium was severely dilated. - Tricuspid valve: There was moderate regurgitation. - Pulmonic valve: There was moderate regurgitation. Impressions: - EF is reduced when compared to prior study (50%).   Recent Labs: 11/04/2015: B Natriuretic Peptide 469.1*; Pro B Natriuretic peptide (BNP) 504.0*; TSH 6.858* 11/09/2015: ALT 18; Hemoglobin 13.3; Platelets 148* 11/12/2015: BUN 26*; Creatinine, Ser 1.17; Magnesium 2.0; Potassium 4.2; Sodium 135  10/21/2015: Cholesterol 114; HDL 46.20; LDL Cholesterol 56; Total CHOL/HDL Ratio 2; Triglycerides 59.0; VLDL 11.8  11/04/15:  TSH 6.858 11/05/15 free T4 1.00 (wnl)  Estimated Creatinine Clearance: 49 mL/min (by C-G formula based on Cr of 1.17).   Wt Readings from Last 3 Encounters:  11/27/15 175 lb (79.379 kg)  11/12/15 164 lb 3.9 oz (74.5 kg)  11/04/15 189 lb (85.73 kg)     Other  studies reviewed: Additional studies/records reviewed today include: summarized above  ASSESSMENT AND PLAN:  1. persistent AFib October his amio was stopped with elevated TSH, at his hospital stay this month was decieded to resume given difficulty with rate management, noted abn TSH and normal free T4, plan was to monitor this out patient and continue amiodarone CHADS2Vasc is at least 5 on Eliquis 2.5mg  BID H/H looked OK 11/09/15, he appears somewhat pale, no bleeding or signs of bleeding reported by the patient He was temporarily on synthroid though states at that time when he went back into AF there was thought the synthroid contributed to it.  Though record appears that he had been in AF without clear symptoms with it and under rate contrrol strategy.  2. CHF New NICM BP unable to tolerate ARB, and intolerant/allergic to ACE Exam today with steady weight gain by patient's home weights, up 11 lbs from hospital weight counselled on CHF  3. HTN  soft BP described during his admission   Disposition: Decrease his amiodarone to 200mg  PO BID, decrease his digoxin to 0.125mg  PO QOD.  Start synthroid 20mcg daily, increase his lasix to 20mg  QD for 3 days then resume QOD.  We will get BMET, CBC, TSH, LFTs and dig level today, send him for CXR and see him back in 2 weeks, sooner if needed.  He mentions he sees his PMD next week as well.  Current medicines are reviewed at length with the patient today.  The patient did not have any concerns regarding medicines.  Haywood Lasso, PA-C 11/27/2015 12:46 PM     [place] [address] [place] [address] [phone] (office)  [phone]  (fax)

## 2015-11-28 LAB — TSH: TSH: 9.203 u[IU]/mL — ABNORMAL HIGH (ref 0.350–4.500)

## 2015-12-01 ENCOUNTER — Ambulatory Visit (INDEPENDENT_AMBULATORY_CARE_PROVIDER_SITE_OTHER)
Admission: RE | Admit: 2015-12-01 | Discharge: 2015-12-01 | Disposition: A | Discharge status: Home / Self Care | Payer: Medicare Other | Source: Ambulatory Visit | Attending: Internal Medicine | Admitting: Internal Medicine

## 2015-12-01 ENCOUNTER — Ambulatory Visit (INDEPENDENT_AMBULATORY_CARE_PROVIDER_SITE_OTHER): Payer: Medicare Other | Admitting: Internal Medicine

## 2015-12-01 ENCOUNTER — Encounter: Payer: Self-pay | Admitting: Physician Assistant

## 2015-12-01 ENCOUNTER — Encounter: Payer: Self-pay | Admitting: Internal Medicine

## 2015-12-01 ENCOUNTER — Other Ambulatory Visit: Payer: Self-pay

## 2015-12-01 VITALS — BP 106/58 | HR 80 | Temp 97.4°F | Resp 16 | Ht 74.0 in | Wt 184.0 lb

## 2015-12-01 DIAGNOSIS — F409 Phobic anxiety disorder, unspecified: Secondary | ICD-10-CM

## 2015-12-01 DIAGNOSIS — J90 Pleural effusion, not elsewhere classified: Secondary | ICD-10-CM | POA: Diagnosis not present

## 2015-12-01 DIAGNOSIS — F5105 Insomnia due to other mental disorder: Secondary | ICD-10-CM

## 2015-12-01 MED ORDER — METOPROLOL TARTRATE 25 MG PO TABS: 25.0000 mg | ORAL_TABLET | Freq: Two times a day (BID) | ORAL | Status: AC

## 2015-12-01 MED ORDER — SUVOREXANT 10 MG PO TABS: 1.0000 | ORAL_TABLET | Freq: Every evening | ORAL | Status: DC | PRN

## 2015-12-01 NOTE — Progress Notes (Signed)
Pre visit review using our clinic review tool, if applicable. No additional management support is needed unless otherwise documented below in the visit note. 

## 2015-12-01 NOTE — Telephone Encounter (Signed)
Ok for verbal 

## 2015-12-01 NOTE — Progress Notes (Signed)
Subjective:  Patient ID: [name], male    DOB: 02-10-1925  Age: 82 y.o. MRN: [id number]  CC: Atrial Fibrillation and Congestive Heart Failure   HPI [name] presents for a hospital follow-up. He complains that he is not feeling much better with continued shortness of breath, persistent/mild nonproductive cough, fatigue, weakness, edema , and with a new complaint of severe insomnia.  Outpatient Prescriptions Prior to Visit  Medication Sig Dispense Refill  . amiodarone (PACERONE) 200 MG tablet Take 1 tablet (200 mg total) by mouth 2 (two) times daily. 180 tablet 2  . apixaban (ELIQUIS) 2.5 MG TABS tablet Take 1 tablet (2.5 mg total) by mouth 2 (two) times daily. 60 tablet 2  . Ascorbic Acid (VITAMIN C) 500 MG tablet Take 500 mg by mouth daily.     Marland Kitchen azelastine (ASTELIN) 0.1 % nasal spray Place 2 sprays into both nostrils 2 (two) times daily. Use in each nostril as directed 90 mL 3  . [facility] GLUCOSAMINE-CHONDROITIN PO Take 1 tablet by mouth daily. 1200/600 mg    . digoxin (LANOXIN) 0.125 MG tablet Take 1 tablet (0.125 mg total) by mouth every other day. 90 tablet 2  . Emollient (AMLACTIN XL) LOTN Apply 1 application topically daily. Apply lotion to legs    . finasteride (PROSCAR) 5 MG tablet Take 1 tablet (5 mg total) by mouth daily. 90 tablet 3  . furosemide (LASIX) 20 MG tablet Take 1 tablet (20 mg total) by mouth every other day. 45 tablet 3  . insulin aspart (NOVOLOG FLEXPEN) 100 UNIT/ML FlexPen Inject 4-8 Units into the skin 3 (three) times daily with meals. Inject 5 units subcutaneously with breakfast, 3 units with lunch and 5 units with supper    . Insulin Pen Needle (B-D UF III MINI PEN NEEDLES) 31G X 5 MM MISC Inject 120 Devices into the skin 4 (four) times daily. Use a new needle for injecting insulin4 times a day 360 each 3  . levothyroxine (SYNTHROID, LEVOTHROID) 25 MCG tablet Take 1 tablet (25 mcg total) by mouth daily before breakfast. 30 tablet 5  .  lipase/protease/amylase (CREON) 12000 UNITS CPEP capsule Take 1 capsule (12,000 Units total) by mouth 3 (three) times daily before meals. 270 capsule 3  . metoprolol tartrate (LOPRESSOR) 25 MG tablet Take 1 tablet (25 mg total) by mouth 2 (two) times daily. 180 tablet 3  . Multiple Vitamins-Minerals (CENTRUM SILVER PO) Take 1 tablet by mouth daily.     . Probiotic Product (PROBIOTIC DAILY PO) Take 3 tablets by mouth daily. 3 tabs once a day with meals.    Marland Kitchen SALINE NA Place 1 spray into both nostrils 2 (two) times daily.     . tamsulosin (FLOMAX) 0.4 MG CAPS capsule Take 1 capsule (0.4 mg total) by mouth 2 (two) times daily. 180 capsule 3   No facility-administered medications prior to visit.    ROS Review of Systems  Constitutional: Positive for fatigue. Negative for fever, chills, diaphoresis, activity change, appetite change and unexpected weight change.  HENT: Negative.  Negative for sinus pressure, trouble swallowing and voice change.   Eyes: Negative.   Respiratory: Positive for cough and shortness of breath. Negative for apnea, choking, chest tightness, wheezing and stridor.   Cardiovascular: Negative.  Negative for chest pain, palpitations and leg swelling.  Gastrointestinal: Negative.  Negative for nausea, abdominal pain, diarrhea, constipation and blood in stool.  Endocrine: Negative.   Genitourinary: Negative.   Musculoskeletal: Negative.  Negative for arthralgias  and neck pain.  Skin: Negative.  Negative for color change and rash.  Allergic/Immunologic: Negative.   Neurological: Negative.  Negative for dizziness, tremors, weakness and light-headedness.  Hematological: Negative.  Negative for adenopathy. Does not bruise/bleed easily.  Psychiatric/Behavioral: Negative.     Objective:  BP 106/58 mmHg  Pulse 80  Temp(Src) 97.4 F (36.3 C) (Oral)  Resp 16  Ht 6\' 2"  (1.88 m)  Wt 184 lb (83.462 kg)  BMI 23.61 kg/m2  SpO2 95%  BP Readings from Last 3 Encounters:  12/01/15  106/58  11/27/15 108/62  11/12/15 111/51    Wt Readings from Last 3 Encounters:  12/01/15 184 lb (83.462 kg)  11/27/15 175 lb (79.379 kg)  11/12/15 164 lb 3.9 oz (74.5 kg)    Physical Exam  Constitutional: He is oriented to person, place, and time. He appears well-developed and well-nourished. No distress.  HENT:  Mouth/Throat: Oropharynx is clear and moist. No oropharyngeal exudate.  Eyes: Conjunctivae are normal. Right eye exhibits no discharge. Left eye exhibits no discharge. No scleral icterus.  Neck: Normal range of motion. Neck supple. No JVD present. No tracheal deviation present. No thyromegaly present.  Cardiovascular: Normal rate, S1 normal, S2 normal and intact distal pulses.  An irregularly irregular rhythm present. Exam reveals gallop and S3. Exam reveals no friction rub.   Murmur heard.  Decrescendo systolic murmur is present with a grade of 2/6   No diastolic murmur is present  Pulmonary/Chest: Effort normal. No accessory muscle usage or stridor. No respiratory distress. He has decreased breath sounds in the right lower field. He has no wheezes. He has no rhonchi. He has no rales. He exhibits no tenderness.  Abdominal: Soft. Bowel sounds are normal. He exhibits no distension and no mass. There is no tenderness. There is no rebound and no guarding.  Musculoskeletal: Normal range of motion. He exhibits edema (2+ pitting edema in BLE). He exhibits no tenderness.  Lymphadenopathy:    He has no cervical adenopathy.  Neurological: He is oriented to person, place, and time.  Skin: Skin is warm and dry. No rash noted. He is not diaphoretic. No erythema. No pallor.  Vitals reviewed.   Lab Results  Component Value Date   WBC 7.1 11/27/2015   HGB 13.0 11/27/2015   HCT 36.6* 11/27/2015   PLT 186 11/27/2015   GLUCOSE 54* 11/27/2015   CHOL 114 10/21/2015   TRIG 59.0 10/21/2015   HDL 46.20 10/21/2015   LDLCALC 56 10/21/2015   ALT 22 11/27/2015   AST 32 11/27/2015   NA 134*  11/27/2015   K 4.6 11/27/2015   CL 100 11/27/2015   CREATININE 1.24* 11/27/2015   BUN 21 11/27/2015   CO2 25 11/27/2015   TSH 9.203* 11/27/2015   PSA 2.28 01/04/2010   INR 1.32 11/09/2015   HGBA1C 6.5 10/21/2015   MICROALBUR <0.7 02/25/2015    Dg Chest 1 View  11/04/2015  CLINICAL DATA:  Initial encounter for status post right thoracentesis. EXAM: CHEST 1 VIEW COMPARISON:  11/04/2015, earlier the same day. FINDINGS: Interval decrease in right pleural effusion no evidence for pneumothorax. Stable appearance of catheter tubing over the medial right hemi thorax and heart silhouette. Left lung remains clear. The cardio pericardial silhouette is enlarged. Telemetry leads overlie the chest. IMPRESSION: No evidence for pneumothorax status post thoracentesis. Electronically Signed   By: Misty Stanley M.D.   On: 11/04/2015 16:56   Dg Chest 2 View  11/05/2015  CLINICAL DATA:  History of recent thoracentesis  forl reduction of right pleural effusion, cough and congestion EXAM: CHEST  2 VIEW COMPARISON:  Portable chest x-ray of 11/04/2015 FINDINGS: After right thoracentesis, there is little change in the small right pleural effusion remaining with right basilar atelectasis. The left lung is clear. Cardiomegaly is stable. No pneumothorax is seen. IMPRESSION: No significant change after right thoracentesis in the volume of the right pleural effusion with right basilar atelectasis. Electronically Signed   By: Ivar Drape M.D.   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 EXAM: CHEST  2 VIEW COMPARISON:  PA and lateral chest x-ray of June 22, 2013 FINDINGS: There is a new moderate-sized right pleural effusion occupying 1/2 of the right hemithorax. Right basilar atelectasis or pneumonia may be obscured. There is small amount of atelectasis or  infiltrate at the left lung base medially. A trace of pleural fluid on the left is suspected. The cardiac silhouette is mildly enlarged. The pulmonary vascularity is normal. The bony thorax exhibits no acute abnormality. IMPRESSION: Since the previous study the patient has developed a moderate sized right pleural effusion. This may be obscuring right basilar atelectasis, pneumonia, or mass. There is a trace left pleural effusion and minimal left basilar atelectasis or infiltrate. There is no evidence of CHF. Chest CT scanning is recommended. Electronically Signed   By: David  Martinique M.D.   On: 11/04/2015 11:30   Ct Angio Chest Pe W/cm &/or Wo Cm  11/05/2015  CLINICAL DATA:  Shortness of breath, pleural effusion. 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 Multiplanar CT image reconstructions and MIPs were obtained to evaluate the vascular anatomy. CONTRAST:  54mL OMNIPAQUE IOHEXOL 350 MG/ML SOLN COMPARISON:  CT abdomen 12/16/ 15 images of the lung bases FINDINGS: Sagittal images of the spine shows osteopenia and degenerative changes thoracic spine. Sagittal view of the sternum is unremarkable. Atherosclerotic calcifications of thoracic aorta and coronary arteries. The study is of excellent technical quality. No pulmonary embolus is noted. Bilateral perihilar peribronchial thickening is noted. There is no mediastinal hematoma or adenopathy. Images of the upper abdomen shows partially visualized postsurgical changes post Whipple procedure. There is moderate size right pleural effusion. Small left pleural effusion. There is atelectasis in left lower lobe posteriorly. There is patchy airspace disease/infiltrate in right middle lobe and right lower lobe. Findings are highly suspicious for pneumonia. There is a catheter fragment in  main pulmonary artery extending from just above the origin in right main pulmonary artery and a small branch. The catheter measures at least 12.5 cm in length. This was only partially visualized on prior exam stable in position from prior exam. Review of the MIP images confirms the above findings. IMPRESSION: 1. No pulmonary embolus. 2. There is moderate size right pleural effusion. Small left pleural effusion. Atelectasis noted in left lower lobe posteriorly. There is consolidation with air bronchogram in right lower lobe and right middle lobe highly suspicious for infiltrate/pneumonia. 3. Mild perihilar bronchitic changes. 4. No mediastinal hematoma or adenopathy. 5. There is a catheter fragment in main pulmonary artery extending from just above the origin in right main pulmonary artery and a small branch. The catheter measures at least 12.5 cm in length. This was only partially  visualized on prior exam stable in position from prior exam. These results were called by telephone at the time of interpretation on 11/05/2015 at 1:57 pm to Dr. Reyne Dumas , who verbally acknowledged these results. Electronically Signed   By: Lahoma Crocker M.D.   On: 11/05/2015 14:02   US Thoracentesis Asp Pleural Space W/img Guide  11/04/2015  INDICATION: Symptomatic right sided pleural effusion EXAM: US THORACENTESIS ASP PLEURAL SPACE W/IMG GUIDE COMPARISON:  CXR 11/04/2015. MEDICATIONS: None COMPLICATIONS: None immediate TECHNIQUE: Informed written consent was obtained from the patient after a discussion of the risks, benefits and alternatives to treatment. A timeout was performed prior to the initiation of the procedure. Initial ultrasound scanning demonstrates a right pleural effusion. The lower chest was prepped and draped in the usual sterile fashion. 1% lidocaine was used for local anesthesia. An ultrasound image was saved for documentation purposes. A 6 Fr Safe-T-Centesis catheter was introduced. The thoracentesis was performed.  The catheter was removed and a dressing was applied. The patient tolerated the procedure well without immediate post procedural complication. The patient was escorted to have an upright chest radiograph. FINDINGS: A total of approximately 1.4 Liters of yellow fluid was removed. Requested samples were sent to the laboratory. IMPRESSION: Successful ultrasound-guided right sided thoracentesis yielding 1.4 liters of pleural fluid. Read By:  Tsosie Billing PA-C Electronically Signed   By: Lucrezia Europe M.D.   On: 11/04/2015 16:31   Dg Chest 2 View  12/01/2015  CLINICAL DATA:  Follow up right pleural effusion EXAM: CHEST  2 VIEW COMPARISON:  November 09, 2015 FINDINGS: No pneumothorax. The catheter in the right pulmonary artery is stable. There is a right-sided pleural effusion with underlying opacity, increased in the interval. The effusion is now moderate in size. No other interval changes or acute abnormalities identified. IMPRESSION: Increasing right-sided pleural effusion with underlying opacity. Electronically Signed   By: Dorise Bullion III M.D   On: 12/01/2015 15:18    Assessment & Plan:   Younis was seen today for atrial fibrillation and congestive heart failure.  Diagnoses and all orders for this visit:  Pleural effusion-  He is status post thoracentesis on the right side with no definite diagnosis. His chest x-ray today shows an enlarging right-sided pleural effusion with an underlying opacity.  I have informed him of this information and have asked him to pursue options to have another thoracentesis performed whether this is as an inpatient or an outpatient. -     DG Chest 2 View; Future  Insomnia due to anxiety and fear- will start belsomra -     Suvorexant (BELSOMRA) 10 MG TABS; Take 1 tablet by mouth at bedtime as needed.   I am having Mr. Ridgley start on Suvorexant. I am also having him maintain his AMLACTIN XL, Multiple Vitamins-Minerals (CENTRUM SILVER PO), vitamin C, [facility]  GLUCOSAMINE-CHONDROITIN PO, Probiotic Product (PROBIOTIC DAILY PO), SALINE NA, lipase/protease/amylase, Insulin Pen Needle, tamsulosin, finasteride, insulin aspart, azelastine, furosemide, apixaban, digoxin, amiodarone, levothyroxine, and metoprolol tartrate.  Meds ordered this encounter  Medications  . Suvorexant (BELSOMRA) 10 MG TABS    Sig: Take 1 tablet by mouth at bedtime as needed.    Dispense:  30 tablet    Refill:  5     Follow-up: Return in about 2 months (around 01/29/2016).  [name], MD

## 2015-12-01 NOTE — Telephone Encounter (Signed)
yes

## 2015-12-01 NOTE — Patient Instructions (Signed)
Edema °Edema is an abnormal buildup of fluids in your body tissues. Edema is somewhat dependent on gravity to pull the fluid to the lowest place in your body. That makes the condition more common in the legs and thighs (lower extremities). Painless swelling of the feet and ankles is common and becomes more likely as you get older. It is also common in looser tissues, like around your eyes.  °When the affected area is squeezed, the fluid may move out of that spot and leave a dent for a few moments. This dent is called pitting.  °CAUSES  °There are many possible causes of edema. Eating too much salt and being on your feet or sitting for a long time can cause edema in your legs and ankles. Hot weather may make edema worse. Common medical causes of edema include: °· Heart failure. °· Liver disease. °· Kidney disease. °· Weak blood vessels in your legs. °· Cancer. °· An injury. °· Pregnancy. °· Some medications. °· Obesity.  °SYMPTOMS  °Edema is usually painless. Your skin may look swollen or shiny.  °DIAGNOSIS  °Your health care provider may be able to diagnose edema by asking about your medical history and doing a physical exam. You may need to have tests such as X-rays, an electrocardiogram, or blood tests to check for medical conditions that may cause edema.  °TREATMENT  °Edema treatment depends on the cause. If you have heart, liver, or kidney disease, you need the treatment appropriate for these conditions. General treatment may include: °· Elevation of the affected body part above the level of your heart. °· Compression of the affected body part. Pressure from elastic bandages or support stockings squeezes the tissues and forces fluid back into the blood vessels. This keeps fluid from entering the tissues. °· Restriction of fluid and salt intake. °· Use of a water pill (diuretic). These medications are appropriate only for some types of edema. They pull fluid out of your body and make you urinate more often. This  gets rid of fluid and reduces swelling, but diuretics can have side effects. Only use diuretics as directed by your health care provider. °HOME CARE INSTRUCTIONS  °· Keep the affected body part above the level of your heart when you are lying down.   °· Do not sit still or stand for prolonged periods.   °· Do not put anything directly under your knees when lying down. °· Do not wear constricting clothing or garters on your upper legs.   °· Exercise your legs to work the fluid back into your blood vessels. This may help the swelling go down.   °· Wear elastic bandages or support stockings to reduce ankle swelling as directed by your health care provider.   °· Eat a low-salt diet to reduce fluid if your health care provider recommends it.   °· Only take medicines as directed by your health care provider.  °SEEK MEDICAL CARE IF:  °· Your edema is not responding to treatment. °· You have heart, liver, or kidney disease and notice symptoms of edema. °· You have edema in your legs that does not improve after elevating them.   °· You have sudden and unexplained weight gain. °SEEK IMMEDIATE MEDICAL CARE IF:  °· You develop shortness of breath or chest pain.   °· You cannot breathe when you lie down. °· You develop pain, redness, or warmth in the swollen areas.   °· You have heart, liver, or kidney disease and suddenly get edema. °· You have a fever and your symptoms suddenly get worse. °MAKE SURE YOU:  °·   Understand these instructions. °· Will watch your condition. °· Will get help right away if you are not doing well or get worse. °  °This information is not intended to replace advice given to you by your health care provider. Make sure you discuss any questions you have with your health care provider. °  °Document Released: 11/14/2005 Document Revised: 12/05/2014 Document Reviewed: 09/06/2013 °[employer]vier Interactive Patient Education ©2016 [employer] ° °

## 2015-12-02 ENCOUNTER — Encounter (HOSPITAL_COMMUNITY): Payer: Self-pay | Admitting: *Deleted

## 2015-12-02 ENCOUNTER — Inpatient Hospital Stay (HOSPITAL_COMMUNITY)
Admission: EM | Admit: 2015-12-02 | Discharge: 2015-12-06 | DRG: 291 | Disposition: A | Discharge status: Home Health | Payer: Medicare Other | Attending: Internal Medicine | Admitting: Internal Medicine

## 2015-12-02 ENCOUNTER — Inpatient Hospital Stay (HOSPITAL_COMMUNITY): Payer: Medicare Other

## 2015-12-02 DIAGNOSIS — K219 Gastro-esophageal reflux disease without esophagitis: Secondary | ICD-10-CM | POA: Diagnosis present

## 2015-12-02 DIAGNOSIS — R0602 Shortness of breath: Secondary | ICD-10-CM

## 2015-12-02 DIAGNOSIS — I2510 Atherosclerotic heart disease of native coronary artery without angina pectoris: Secondary | ICD-10-CM | POA: Diagnosis present

## 2015-12-02 DIAGNOSIS — F5105 Insomnia due to other mental disorder: Secondary | ICD-10-CM

## 2015-12-02 DIAGNOSIS — Z880 Allergy status to penicillin: Secondary | ICD-10-CM | POA: Diagnosis not present

## 2015-12-02 DIAGNOSIS — I472 Ventricular tachycardia: Secondary | ICD-10-CM | POA: Diagnosis not present

## 2015-12-02 DIAGNOSIS — D649 Anemia, unspecified: Secondary | ICD-10-CM | POA: Diagnosis present

## 2015-12-02 DIAGNOSIS — E1122 Type 2 diabetes mellitus with diabetic chronic kidney disease: Secondary | ICD-10-CM | POA: Diagnosis present

## 2015-12-02 DIAGNOSIS — R569 Unspecified convulsions: Secondary | ICD-10-CM | POA: Diagnosis present

## 2015-12-02 DIAGNOSIS — Z794 Long term (current) use of insulin: Secondary | ICD-10-CM | POA: Diagnosis not present

## 2015-12-02 DIAGNOSIS — N182 Chronic kidney disease, stage 2 (mild): Secondary | ICD-10-CM | POA: Diagnosis present

## 2015-12-02 DIAGNOSIS — I5023 Acute on chronic systolic (congestive) heart failure: Secondary | ICD-10-CM | POA: Diagnosis present

## 2015-12-02 DIAGNOSIS — Z888 Allergy status to other drugs, medicaments and biological substances status: Secondary | ICD-10-CM

## 2015-12-02 DIAGNOSIS — R0609 Other forms of dyspnea: Secondary | ICD-10-CM | POA: Diagnosis present

## 2015-12-02 DIAGNOSIS — Z881 Allergy status to other antibiotic agents status: Secondary | ICD-10-CM | POA: Diagnosis not present

## 2015-12-02 DIAGNOSIS — J301 Allergic rhinitis due to pollen: Secondary | ICD-10-CM

## 2015-12-02 DIAGNOSIS — I5022 Chronic systolic (congestive) heart failure: Secondary | ICD-10-CM | POA: Diagnosis present

## 2015-12-02 DIAGNOSIS — I481 Persistent atrial fibrillation: Secondary | ICD-10-CM | POA: Diagnosis not present

## 2015-12-02 DIAGNOSIS — N138 Other obstructive and reflux uropathy: Secondary | ICD-10-CM | POA: Diagnosis present

## 2015-12-02 DIAGNOSIS — I482 Chronic atrial fibrillation: Secondary | ICD-10-CM | POA: Diagnosis present

## 2015-12-02 DIAGNOSIS — N401 Enlarged prostate with lower urinary tract symptoms: Secondary | ICD-10-CM | POA: Diagnosis present

## 2015-12-02 DIAGNOSIS — Z9889 Other specified postprocedural states: Secondary | ICD-10-CM

## 2015-12-02 DIAGNOSIS — Z8509 Personal history of malignant neoplasm of other digestive organs: Secondary | ICD-10-CM

## 2015-12-02 DIAGNOSIS — E038 Other specified hypothyroidism: Secondary | ICD-10-CM

## 2015-12-02 DIAGNOSIS — I4819 Other persistent atrial fibrillation: Secondary | ICD-10-CM | POA: Diagnosis present

## 2015-12-02 DIAGNOSIS — I872 Venous insufficiency (chronic) (peripheral): Secondary | ICD-10-CM

## 2015-12-02 DIAGNOSIS — F409 Phobic anxiety disorder, unspecified: Secondary | ICD-10-CM

## 2015-12-02 DIAGNOSIS — D638 Anemia in other chronic diseases classified elsewhere: Secondary | ICD-10-CM

## 2015-12-02 DIAGNOSIS — J948 Other specified pleural conditions: Secondary | ICD-10-CM | POA: Diagnosis not present

## 2015-12-02 DIAGNOSIS — E039 Hypothyroidism, unspecified: Secondary | ICD-10-CM | POA: Diagnosis present

## 2015-12-02 DIAGNOSIS — K8689 Other specified diseases of pancreas: Secondary | ICD-10-CM

## 2015-12-02 DIAGNOSIS — E118 Type 2 diabetes mellitus with unspecified complications: Secondary | ICD-10-CM

## 2015-12-02 DIAGNOSIS — I130 Hypertensive heart and chronic kidney disease with heart failure and stage 1 through stage 4 chronic kidney disease, or unspecified chronic kidney disease: Secondary | ICD-10-CM | POA: Diagnosis present

## 2015-12-02 DIAGNOSIS — Z886 Allergy status to analgesic agent status: Secondary | ICD-10-CM

## 2015-12-02 DIAGNOSIS — Z87891 Personal history of nicotine dependence: Secondary | ICD-10-CM | POA: Diagnosis not present

## 2015-12-02 DIAGNOSIS — J90 Pleural effusion, not elsewhere classified: Secondary | ICD-10-CM | POA: Diagnosis present

## 2015-12-02 DIAGNOSIS — I4729 Other ventricular tachycardia: Secondary | ICD-10-CM

## 2015-12-02 LAB — BASIC METABOLIC PANEL
ANION GAP: 8 (ref 5–15)
BUN: 19 mg/dL (ref 6–20)
CALCIUM: 8.7 mg/dL — AB (ref 8.9–10.3)
CHLORIDE: 107 mmol/L (ref 101–111)
CO2: 25 mmol/L (ref 22–32)
Creatinine, Ser: 1.3 mg/dL — ABNORMAL HIGH (ref 0.61–1.24)
GFR calc non Af Amer: 47 mL/min — ABNORMAL LOW (ref >60)
GFR, EST AFRICAN AMERICAN: 55 mL/min — AB (ref >60)
Glucose, Bld: 138 mg/dL — ABNORMAL HIGH (ref 65–99)
Potassium: 5 mmol/L (ref 3.5–5.1)
Sodium: 140 mmol/L (ref 135–145)

## 2015-12-02 LAB — CBC
HCT: 37.4 % — ABNORMAL LOW (ref 39.0–52.0)
HEMOGLOBIN: 12.3 g/dL — AB (ref 13.0–17.0)
MCH: 31.5 pg (ref 26.0–34.0)
MCHC: 32.9 g/dL (ref 30.0–36.0)
MCV: 95.9 fL (ref 78.0–100.0)
Platelets: 149 10*3/uL — ABNORMAL LOW (ref 150–400)
RBC: 3.9 MIL/uL — AB (ref 4.22–5.81)
RDW: 14.5 % (ref 11.5–15.5)
WBC: 5.9 10*3/uL (ref 4.0–10.5)

## 2015-12-02 LAB — GLUCOSE, CAPILLARY: GLUCOSE-CAPILLARY: 194 mg/dL — AB (ref 65–99)

## 2015-12-02 LAB — I-STAT TROPONIN, ED: Troponin i, poc: 0.03 ng/mL (ref 0.00–0.08)

## 2015-12-02 MED ORDER — FUROSEMIDE 20 MG PO TABS
20.0000 mg | ORAL_TABLET | ORAL | Status: DC
  Administered 2015-12-04 – 2015-12-06 (×2): 20 mg via ORAL
  Filled 2015-12-02 (×3): qty 1

## 2015-12-02 MED ORDER — PANCRELIPASE (LIP-PROT-AMYL) 12000-38000 UNITS PO CPEP
12000.0000 [IU] | ORAL_CAPSULE | Freq: Three times a day (TID) | ORAL | Status: DC
  Administered 2015-12-03 – 2015-12-06 (×10): 12000 [IU] via ORAL
  Filled 2015-12-02 (×10): qty 1

## 2015-12-02 MED ORDER — VITAMIN C 500 MG PO TABS
500.0000 mg | ORAL_TABLET | Freq: Every day | ORAL | Status: DC
  Administered 2015-12-03 – 2015-12-06 (×4): 500 mg via ORAL
  Filled 2015-12-02 (×4): qty 1

## 2015-12-02 MED ORDER — FINASTERIDE 5 MG PO TABS
5.0000 mg | ORAL_TABLET | Freq: Every day | ORAL | Status: DC
  Administered 2015-12-03 – 2015-12-06 (×4): 5 mg via ORAL
  Filled 2015-12-02 (×4): qty 1

## 2015-12-02 MED ORDER — INSULIN ASPART 100 UNIT/ML FLEXPEN
4.0000 [IU] | PEN_INJECTOR | Freq: Three times a day (TID) | SUBCUTANEOUS | Status: DC
  Filled 2015-12-02: qty 3

## 2015-12-02 MED ORDER — DIGOXIN 125 MCG PO TABS
0.1250 mg | ORAL_TABLET | ORAL | Status: DC
  Administered 2015-12-04 – 2015-12-06 (×2): 0.125 mg via ORAL
  Filled 2015-12-02 (×3): qty 1

## 2015-12-02 MED ORDER — TAMSULOSIN HCL 0.4 MG PO CAPS
0.4000 mg | ORAL_CAPSULE | Freq: Two times a day (BID) | ORAL | Status: DC
  Administered 2015-12-02 – 2015-12-06 (×8): 0.4 mg via ORAL
  Filled 2015-12-02 (×8): qty 1

## 2015-12-02 MED ORDER — LEVOTHYROXINE SODIUM 25 MCG PO TABS
25.0000 ug | ORAL_TABLET | Freq: Every day | ORAL | Status: DC
  Administered 2015-12-03 – 2015-12-06 (×4): 25 ug via ORAL
  Filled 2015-12-02 (×4): qty 1

## 2015-12-02 MED ORDER — ONDANSETRON HCL 4 MG PO TABS: 4.0000 mg | ORAL_TABLET | Freq: Four times a day (QID) | ORAL | Status: DC | PRN

## 2015-12-02 MED ORDER — AMIODARONE HCL 200 MG PO TABS
200.0000 mg | ORAL_TABLET | Freq: Two times a day (BID) | ORAL | Status: DC
  Administered 2015-12-02 – 2015-12-06 (×8): 200 mg via ORAL
  Filled 2015-12-02 (×8): qty 1

## 2015-12-02 MED ORDER — INSULIN ASPART 100 UNIT/ML ~~LOC~~ SOLN
0.0000 [IU] | Freq: Three times a day (TID) | SUBCUTANEOUS | Status: DC
  Administered 2015-12-05: 1 [IU] via SUBCUTANEOUS
  Administered 2015-12-05: 2 [IU] via SUBCUTANEOUS
  Administered 2015-12-06: 1 [IU] via SUBCUTANEOUS

## 2015-12-02 MED ORDER — INSULIN ASPART 100 UNIT/ML ~~LOC~~ SOLN
4.0000 [IU] | Freq: Three times a day (TID) | SUBCUTANEOUS | Status: DC
  Administered 2015-12-03 (×2): 5 [IU] via SUBCUTANEOUS

## 2015-12-02 MED ORDER — AZELASTINE HCL 0.1 % NA SOLN
2.0000 | Freq: Two times a day (BID) | NASAL | Status: DC
  Filled 2015-12-02 (×2): qty 30

## 2015-12-02 MED ORDER — METOPROLOL TARTRATE 25 MG PO TABS
25.0000 mg | ORAL_TABLET | Freq: Two times a day (BID) | ORAL | Status: DC
  Administered 2015-12-02 – 2015-12-06 (×8): 25 mg via ORAL
  Filled 2015-12-02 (×8): qty 1

## 2015-12-02 MED ORDER — ONDANSETRON HCL 4 MG/2ML IJ SOLN: 4.0000 mg | Freq: Four times a day (QID) | INTRAMUSCULAR | Status: DC | PRN

## 2015-12-02 NOTE — ED Notes (Signed)
Gave pt turkey sandwich and diet sprite 

## 2015-12-02 NOTE — ED Provider Notes (Signed)
CSN: [id number]     Arrival date & time 12/02/15  1339 History   First MD Initiated Contact with Patient 12/02/15 1808     Chief Complaint  Patient presents with  . Shortness of Breath     (Consider location/radiation/quality/duration/timing/severity/associated sxs/prior Treatment) HPI Patient presents with recurrent of right-sided pleural effusion. Recently discharged from hospital for the same. Was evaluated by his primary doctor yesterday with chest x-ray that showed effusion and opacity. Patient states he's had a dry nonproductive cough. States he has ongoing shortness of breath especially with any exertion. He denies any chest pain. He's had no fever or chills. Has ongoing bilateral lower extremity edema. He is on Lasix every other day. He takes Eliquis for chronic atrial fibrillation. Past Medical History  Diagnosis Date  . Acid reflux disease     history of  . H/O: GI bleed   . Persistent atrial fibrillation ([place])     a. Failed tikosyn. b. DCCV did not hold 07/2015.  Marland Kitchen Benign prostatic hypertrophy     history of  . Hepatic damage march 2009    retained hepatic stone , intrahepatic duct catheter  . CAD (coronary artery disease)     a. R/LHC 11/09/15: mild nonobstructive CAD (NICM) with well- compensated hemodynamics with low filling pressures.  . Hypertension   . 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 Seizures ([place])   . Ventricular tachycardia ? Tikosyn proarrhtyhmia   . Cataracts, bilateral   . Diabetes mellitus     insulin dep  . Pleural effusion 10/2015  . Acute on chronic systolic CHF (congestive heart failure) ([place])   . CKD (chronic kidney disease), stage II    Past Surgical History  Procedure Laterality Date  . Cardioversion    . Cholecystectomy      history of  . Partial gastrectomy      history of  . Shoulder surgery       left shoulder repair with 2 pens inserted  . Whipple procedure      operation  . Sarcoma removal from rigth tricep    . Inguinal hernia repair      inguinal herniorrhaphy, right... history of  . Appendectomy      history of  . Cardioversion  03/07/2012    Procedure: CARDIOVERSION;  Surgeon: Deboraha Sprang, MD;  Location: [place];  Service: Cardiovascular;  Laterality: N/A;  . Cardioversion N/A 08/10/2015    Procedure: CARDIOVERSION;  Surgeon: Thayer Headings, MD;  Location[facility] ENDOSCOPY;  Service: Cardiovascular;  Laterality: N/A;  . Cardiac catheterization N/A 11/09/2015    Procedure: Right/Left Heart Cath and Coronary Angiography;  Surgeon: Jolaine Artist, MD;  Location: [place] CV LAB;  Service: Cardiovascular;  Laterality: N/A;   Family History  Problem Relation Age of Onset  . Coronary artery disease Father 50  . Heart disease Father     fatal MI  . Osteoarthritis Mother 64  . Arthritis Mother   . Coronary artery disease Brother     cabg, avr, pvd with sents  . Heart disease Brother     CABG, PVD  . Peripheral vascular disease Sister 88  . Fibromyalgia Sister   . Arthritis Sister   . Coronary artery disease Sister   . Diabetes Sister   . Cancer Brother    Social  History  Substance Use Topics  . Smoking status: Former Smoker    Types: Pipe    Quit date: 03/14/1991  . Smokeless tobacco: Never Used  . Alcohol Use: No    Review of Systems  Constitutional: Negative for fever and chills.  Respiratory: Positive for cough and shortness of breath.   Cardiovascular: Positive for leg swelling. Negative for chest pain and palpitations.  Gastrointestinal: Negative for nausea, vomiting, abdominal pain and diarrhea.  Musculoskeletal: Negative for back pain, neck pain and neck stiffness.  Skin: Negative for rash and wound.  Neurological: Negative for dizziness, weakness, light-headedness, numbness and headaches.  All other systems reviewed and are  negative.     Allergies  Lisinopril; Toujeo solostar; Clindamycin; Other; Oxycodone-acetaminophen; and Penicillins  Home Medications   Prior to Admission medications   Medication Sig Start Date End Date Taking? Authorizing Provider  amiodarone (PACERONE) 200 MG tablet Take 1 tablet (200 mg total) by mouth 2 (two) times daily. 11/27/15   Renee Dyane Dustman, PA-C  apixaban (ELIQUIS) 2.5 MG TABS tablet Take 1 tablet (2.5 mg total) by mouth 2 (two) times daily. 11/10/15   Reyne Dumas, MD  Ascorbic Acid (VITAMIN C) 500 MG tablet Take 500 mg by mouth daily.     Historical Provider, MD  azelastine (ASTELIN) 0.1 % nasal spray Place 2 sprays into both nostrils 2 (two) times daily. Use in each nostril as directed 10/21/15   Janith Lima, MD  [employer] GLUCOSAMINE-CHONDROITIN PO Take 1 tablet by mouth daily. 1200/600 mg    Historical Provider, MD  digoxin (LANOXIN) 0.125 MG tablet Take 1 tablet (0.125 mg total) by mouth every other day. 11/27/15   Renee Dyane Dustman, PA-C  Emollient (AMLACTIN XL) LOTN Apply 1 application topically daily. Apply lotion to legs    Historical Provider, MD  finasteride (PROSCAR) 5 MG tablet Take 1 tablet (5 mg total) by mouth daily. 06/10/15   Janith Lima, MD  furosemide (LASIX) 20 MG tablet Take 1 tablet (20 mg total) by mouth every other day. 11/11/15   Reyne Dumas, MD  insulin aspart (NOVOLOG FLEXPEN) 100 UNIT/ML FlexPen Inject 4-8 Units into the skin 3 (three) times daily with meals. Inject 5 units subcutaneously with breakfast, 3 units with lunch and 5 units with supper    Historical Provider, MD  Insulin Pen Needle (B-D UF III MINI PEN NEEDLES) 31G X 5 MM MISC Inject 120 Devices into the skin 4 (four) times daily. Use a new needle for injecting insulin4 times a day 02/25/15   Janith Lima, MD  levothyroxine (SYNTHROID, LEVOTHROID) 25 MCG tablet Take 1 tablet (25 mcg total) by mouth daily before breakfast. 11/27/15   Renee Dyane Dustman, PA-C  lipase/protease/amylase (CREON)  12000 UNITS CPEP capsule Take 1 capsule (12,000 Units total) by mouth 3 (three) times daily before meals. 10/27/14   Janith Lima, MD  metoprolol tartrate (LOPRESSOR) 25 MG tablet Take 1 tablet (25 mg total) by mouth 2 (two) times daily. 12/01/15   Renee Dyane Dustman, PA-C  Multiple Vitamins-Minerals (CENTRUM SILVER PO) Take 1 tablet by mouth daily.     Historical Provider, MD  Probiotic Product (PROBIOTIC DAILY PO) Take 3 tablets by mouth daily. 3 tabs once a day with meals.    Historical Provider, MD  SALINE NA Place 1 spray into both nostrils 2 (two) times daily.     Historical Provider, MD  Suvorexant (BELSOMRA) 10 MG TABS Take 1 tablet by mouth at bedtime as needed. 12/01/15  Janith Lima, MD  tamsulosin (FLOMAX) 0.4 MG CAPS capsule Take 1 capsule (0.4 mg total) by mouth 2 (two) times daily. 06/10/15   Janith Lima, MD   BP 126/70 mmHg  Pulse 88  Temp(Src) 97.3 F (36.3 C) (Oral)  Resp 22  Ht 6\' 2"  (1.88 m)  Wt 184 lb 1.6 oz (83.507 kg)  BMI 23.63 kg/m2  SpO2 95% Physical Exam  Constitutional: He is oriented to person, place, and time. He appears well-developed and well-nourished. No distress.  HENT:  Head: Normocephalic and atraumatic.  Mouth/Throat: Oropharynx is clear and moist.  Eyes: EOM are normal. Pupils are equal, round, and reactive to light.  Neck: Normal range of motion. Neck supple.  Cardiovascular: Normal rate and regular rhythm.   Pulmonary/Chest: Effort normal. No respiratory distress. He has no wheezes. He has no rales.  Decreased breath sounds right base.  Abdominal: Soft. Bowel sounds are normal. He exhibits no distension and no mass. There is no tenderness. There is no rebound and no guarding.  Musculoskeletal: Normal range of motion. He exhibits edema. He exhibits no tenderness.  2+ bilateral pitting edema to the lower extremities. Distal pulses intact.  Neurological: He is alert and oriented to person, place, and time.  Skin: Skin is warm and dry. No rash  noted. No erythema.  Psychiatric: He has a normal mood and affect. His behavior is normal.  Nursing note and vitals reviewed.   ED Course  Procedures (including critical care time) Labs Review Labs Reviewed  BASIC METABOLIC PANEL - Abnormal; Notable for the following:    Glucose, Bld 138 (*)    Creatinine, Ser 1.30 (*)    Calcium 8.7 (*)    GFR calc non Af Amer 47 (*)    GFR calc Af Amer 55 (*)    All other components within normal limits  CBC - Abnormal; Notable for the following:    RBC 3.90 (*)    Hemoglobin 12.3 (*)    HCT 37.4 (*)    Platelets 149 (*)    All other components within normal limits  I-STAT TROPOININ, ED    Imaging Review Dg Chest 2 View  12/01/2015  CLINICAL DATA:  Follow up right pleural effusion EXAM: CHEST  2 VIEW COMPARISON:  November 09, 2015 FINDINGS: No pneumothorax. The catheter in the right pulmonary artery is stable. There is a right-sided pleural effusion with underlying opacity, increased in the interval. The effusion is now moderate in size. No other interval changes or acute abnormalities identified. IMPRESSION: Increasing right-sided pleural effusion with underlying opacity. Electronically Signed   By: Dorise Bullion III M.D   On: 12/01/2015 15:18   I have personally reviewed and evaluated these images and lab results as part of my medical decision-making.   EKG Interpretation   Date/Time:  Wednesday December 02 2015 14:08:48 EST Ventricular Rate:  78 PR Interval:    QRS Duration: 86 QT Interval:  462 QTC Calculation: 526 R Axis:   87 Text Interpretation:  Atrial fibrillation Low voltage QRS Cannot rule out  Anteroseptal infarct , age undetermined Prolonged QT Abnormal ECG  Confirmed by Lita Mains  MD, Yasiel Goyne ([id number]) on 12/02/2015 7:20:31 PM      MDM   Final diagnoses:  DOE (dyspnea on exertion)  Recurrent right pleural effusion     Patient with increasing right pleural effusion. He has no infective symptoms. He is afebrile in the  emergency department. He has a normal white blood cell count. Suspicion for pneumonia.  Will discuss with [facility] hospitalists regarding thoracentesis.  Discussed with [facility]. Will admit to telemetry bed.  Julianne Rice, MD 12/05/15 1730

## 2015-12-02 NOTE — H&P (Signed)
[facility] History and Physical  DARINEL NOH [id number] DOB: November 15, 1925 DOA: 12/02/2015  Referring physician: Dr. Lita Mains. PCP: [name], MD  Specialists: Dr. Acie Fredrickson. Cardiologist.  Chief Complaint: Shortness of breath.  HPI: [name] is a 82 y.o. male with history of chronic systolic heart failure, atrial fibrillation, hypothyroidism, diabetes mellitus, chronic anemia who was admitted in December 2016 for pleural effusion and has had thoracentesis twice present to the ER because of chest x-ray showing recurrence of pleural effusion. Patient states he has been getting short of breath progressively after discharge last month and his PCP had ordered chest x-ray which showed recurrence of pleural effusion and was advised to come to the ER. Chest x-ray done today shows worsening of pleural effusion and patient had been admitted for thoracentesis. Patient denies any chest pain. Has some nonproductive cough. Denies any fever or chills. Patient does have mild edema of the lower extremities.  Review of Systems: As presented in the history of presenting illness, rest negative.  Past Medical History  Diagnosis Date  . Acid reflux disease     history of  . H/O: GI bleed   . Persistent atrial fibrillation ([place])     a. Failed tikosyn. b. DCCV did not hold 07/2015.  Marland Kitchen Benign prostatic hypertrophy     history of  . Hepatic damage march 2009    retained hepatic stone , intrahepatic duct catheter  . CAD (coronary artery disease)     a. R/LHC 11/09/15: mild nonobstructive CAD ([facility]) with well- compensated hemodynamics with low filling pressures.  . Hypertension   . 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 Seizures ([place])   . Ventricular tachycardia ? Tikosyn proarrhtyhmia   . Cataracts, bilateral   . Diabetes mellitus     insulin dep   . Pleural effusion 10/2015  . Acute on chronic systolic CHF (congestive heart failure) ([place])   . CKD (chronic kidney disease), stage II    Past Surgical History  Procedure Laterality Date  . Cardioversion    . Cholecystectomy      history of  . Partial gastrectomy      history of  . Shoulder surgery      left shoulder repair with 2 pens inserted  . Whipple procedure      operation  . Sarcoma removal from rigth tricep    . Inguinal hernia repair      inguinal herniorrhaphy, right... history of  . Appendectomy      history of  . Cardioversion  03/07/2012    Procedure: CARDIOVERSION;  Surgeon: Deboraha Sprang, MD;  Location: [place];  Service: Cardiovascular;  Laterality: N/A;  . Cardioversion N/A 08/10/2015    Procedure: CARDIOVERSION;  Surgeon: Thayer Headings, MD;  Location[facility] ENDOSCOPY;  Service: Cardiovascular;  Laterality: N/A;  . Cardiac catheterization N/A 11/09/2015    Procedure: Right/Left Heart Cath and Coronary Angiography;  Surgeon: Jolaine Artist, MD;  Location: [place] CV LAB;  Service: Cardiovascular;  Laterality: N/A;   Social History:  reports that he quit smoking about 24 years ago. His smoking use included Pipe. He has never used smokeless tobacco. He reports that he does not drink alcohol or use illicit drugs. Where does patient live at home. Can patient participate in ADLs? Yes.  Allergies  Allergen Reactions  .  Lisinopril Cough  . Toujeo Solostar [Insulin Glargine] Other (See Comments)    Shaking, felt bad all over  . Clindamycin Diarrhea  . Other Other (See Comments)    toujeo with perioral tingling only - pt refuses to take further  . Oxycodone-Acetaminophen Diarrhea  . Penicillins Hives    Has patient had a PCN reaction causing immediate rash, facial/tongue/throat swelling, SOB or lightheadedness with hypotension: Yes Has patient had a PCN reaction causing severe rash involving mucus membranes or skin necrosis: no  Has patient had a PCN  reaction that required hospitalization No Has patient had a PCN reaction occurring within the last 10 years: No If all of the above answers are "NO", then may proceed with Cephalosporin use.    Family History:  Family History  Problem Relation Age of Onset  . Coronary artery disease Father 24  . Heart disease Father     fatal MI  . Osteoarthritis Mother 65  . Arthritis Mother   . Coronary artery disease Brother     cabg, avr, pvd with sents  . Heart disease Brother     CABG, PVD  . Peripheral vascular disease Sister 10  . Fibromyalgia Sister   . Arthritis Sister   . Coronary artery disease Sister   . Diabetes Sister   . Cancer Brother       Prior to Admission medications   Medication Sig Start Date End Date Taking? Authorizing Provider  amiodarone (PACERONE) 200 MG tablet Take 1 tablet (200 mg total) by mouth 2 (two) times daily. 11/27/15   Renee Dyane Dustman, PA-C  apixaban (ELIQUIS) 2.5 MG TABS tablet Take 1 tablet (2.5 mg total) by mouth 2 (two) times daily. 11/10/15   Reyne Dumas, MD  Ascorbic Acid (VITAMIN C) 500 MG tablet Take 500 mg by mouth daily.     Historical Provider, MD  azelastine (ASTELIN) 0.1 % nasal spray Place 2 sprays into both nostrils 2 (two) times daily. Use in each nostril as directed 10/21/15   Janith Lima, MD  [employer] GLUCOSAMINE-CHONDROITIN PO Take 1 tablet by mouth daily. 1200/600 mg    Historical Provider, MD  digoxin (LANOXIN) 0.125 MG tablet Take 1 tablet (0.125 mg total) by mouth every other day. 11/27/15   Renee Dyane Dustman, PA-C  Emollient (AMLACTIN XL) LOTN Apply 1 application topically daily. Apply lotion to legs    Historical Provider, MD  finasteride (PROSCAR) 5 MG tablet Take 1 tablet (5 mg total) by mouth daily. 06/10/15   Janith Lima, MD  furosemide (LASIX) 20 MG tablet Take 1 tablet (20 mg total) by mouth every other day. 11/11/15   Reyne Dumas, MD  insulin aspart (NOVOLOG FLEXPEN) 100 UNIT/ML FlexPen Inject 4-8 Units into the skin 3  (three) times daily with meals. Inject 5 units subcutaneously with breakfast, 3 units with lunch and 5 units with supper    Historical Provider, MD  Insulin Pen Needle (B-D UF III MINI PEN NEEDLES) 31G X 5 MM MISC Inject 120 Devices into the skin 4 (four) times daily. Use a new needle for injecting insulin4 times a day 02/25/15   Janith Lima, MD  levothyroxine (SYNTHROID, LEVOTHROID) 25 MCG tablet Take 1 tablet (25 mcg total) by mouth daily before breakfast. 11/27/15   Renee Dyane Dustman, PA-C  lipase/protease/amylase (CREON) 12000 UNITS CPEP capsule Take 1 capsule (12,000 Units total) by mouth 3 (three) times daily before meals. 10/27/14   Janith Lima, MD  metoprolol tartrate (LOPRESSOR) 25 MG tablet  Take 1 tablet (25 mg total) by mouth 2 (two) times daily. 12/01/15   Renee Dyane Dustman, PA-C  Multiple Vitamins-Minerals (CENTRUM SILVER PO) Take 1 tablet by mouth daily.     Historical Provider, MD  Probiotic Product (PROBIOTIC DAILY PO) Take 3 tablets by mouth daily. 3 tabs once a day with meals.    Historical Provider, MD  SALINE NA Place 1 spray into both nostrils 2 (two) times daily.     Historical Provider, MD  Suvorexant (BELSOMRA) 10 MG TABS Take 1 tablet by mouth at bedtime as needed. 12/01/15   Janith Lima, MD  tamsulosin (FLOMAX) 0.4 MG CAPS capsule Take 1 capsule (0.4 mg total) by mouth 2 (two) times daily. 06/10/15   Janith Lima, MD    Physical Exam: Filed Vitals:   12/02/15 1915 12/02/15 1953 12/02/15 2021 12/02/15 2026  BP: 122/48 114/49  113/46  Pulse: 83 72  98  Temp:    97.6 F (36.4 C)  TempSrc:    Oral  Resp: 21 11  18   Height:   6\' 3"  (1.905 m)   Weight:   80.967 kg (178 lb 8 oz)   SpO2: 95% 97%  96%     General:  Moderately built and nourished.  Eyes: Anicteric pallor.  ENT: No discharge from the ears eyes nose or mouth.  Neck: No mass felt. No JVD appreciated.  Cardiovascular: S1 and S2 heard.  Respiratory: No rhonchi or crepitations.  Abdomen: Soft  nontender bowel sounds present.  Skin: Chronic skin changes.  Musculoskeletal: Bilateral lower extremity edema.  Psychiatric: Appears normal.  Neurologic: Alert awake oriented to time place and person. Moves all extremities.  Labs on Admission:  Basic Metabolic Panel:  Recent Labs Lab 11/27/15 1211 12/02/15 1401  NA 134* 140  K 4.6 5.0  CL 100 107  CO2 25 25  GLUCOSE 54* 138*  BUN 21 19  CREATININE 1.24* 1.30*  CALCIUM 8.4* 8.7*   Liver Function Tests:  Recent Labs Lab 11/27/15 1211  AST 32  ALT 22  ALKPHOS 130*  BILITOT 0.5  PROT 6.0*  ALBUMIN 3.0*   No results for input(s): LIPASE, AMYLASE in the last 168 hours. No results for input(s): AMMONIA in the last 168 hours. CBC:  Recent Labs Lab 11/27/15 1211 12/02/15 1401  WBC 7.1 5.9  HGB 13.0 12.3*  HCT 36.6* 37.4*  MCV 96.3 95.9  PLT 186 149*   Cardiac Enzymes: No results for input(s): CKTOTAL, CKMB, CKMBINDEX, TROPONINI in the last 168 hours.  BNP (last 3 results)  Recent Labs  11/04/15 1312  BNP 469.1*    ProBNP (last 3 results)  Recent Labs  11/04/15 1100  PROBNP 504.0*    CBG: No results for input(s): GLUCAP in the last 168 hours.  Radiological Exams on Admission: Dg Chest 2 View  12/01/2015  CLINICAL DATA:  Follow up right pleural effusion EXAM: CHEST  2 VIEW COMPARISON:  November 09, 2015 FINDINGS: No pneumothorax. The catheter in the right pulmonary artery is stable. There is a right-sided pleural effusion with underlying opacity, increased in the interval. The effusion is now moderate in size. No other interval changes or acute abnormalities identified. IMPRESSION: Increasing right-sided pleural effusion with underlying opacity. Electronically Signed   By: Dorise Bullion III M.D   On: 12/01/2015 15:18   Dg Chest Port 1 View  12/02/2015  CLINICAL DATA:  Progressive shortness of breath. History of right pleural effusion. EXAM: PORTABLE CHEST 1 VIEW COMPARISON:  Frontal and lateral  views yesterday. Chest CT 11/05/2015 FINDINGS: Catheter in the right hemithorax, corresponding to right pulmonary artery on CT unchanged in position. Progressive right pleural effusion and hazy opacity throughout the right hemithorax. Stable blunting of left costophrenic angle. Cardiomediastinal contours are unchanged. No pneumothorax. The cortex of the right proximal humerus appears irregular, only partially included in the field of view. IMPRESSION: 1. Increasing right pleural effusion, moderate to large in size. 2. Question of irregular cortex right proximal humerus, only partially included. Dedicated humerus radiographs recommended, as well as correlation for any pain in this region. Electronically Signed   By: Jeb Levering M.D.   On: 12/02/2015 22:01     Assessment/Plan Principal Problem:   Recurrent pleural effusion on right Active Problems:   Persistent atrial fibrillation ([facility])   Paroxysmal VT-proarrhythmia 2/2 tikosyn   DOE (dyspnea on exertion)   Chronic systolic CHF (congestive heart failure) ([place])  #1. Recurrent right-sided pleural effusion - cause was not clear. At this time I'm holding off patient's Apixaban. Last dose was today morning. I have requested ultrasound-guided thoracentesis. Patient has frequent recurrent pleural effusion will need cardiothoracic surgery consult. Follow thoracentesis labs. Chest x-ray shows possible opacity but patient has no signs of infection at this time. #2. Chronic atrial fibrillation - rate controlled. Patient's Apixaban is on hold for thoracentesis. Patient is agreeable. Continue amiodarone metoprolol and digoxin. #3. Chronic systolic heart failure last EF measured was 25% - continue Lasix. After thoracentesis patient may need increased dose of Lasix if patient still looks fluid overloaded. #4. Diabetes mellitus type 2 - continue home medications with sliding scale coverage. #5. Chronic anemia - follow CBC. #6. Hypothyroidism on Synthroid. #7.  History of malignant neoplasm of the gallbladder.   DVT Prophylaxis SCDs.  Code Status: Full code.  Family Communication: Discussed with patient.  Disposition Plan: Admit to inpatient.    Era Parr N. [facility] Pager [phone].  If 7PM-7AM, please contact night-coverage [email or web address] Password [email or web address] 12/02/2015, 10:29 PM

## 2015-12-02 NOTE — Telephone Encounter (Signed)
Verbal given 

## 2015-12-02 NOTE — Progress Notes (Signed)
Pt arrived to floor in NAD, VSS, steady on feet with cane, A/Ox4. Admitting MD paged, pt with no complaints at this time.

## 2015-12-02 NOTE — Progress Notes (Signed)
Assumed care from [place], [place].

## 2015-12-02 NOTE — ED Notes (Signed)
Pt had recent admission for sob and right side pleural effusion. Pt had repeat xray done today and sent here due to increase in pleural effusion. No resp distress noted at triage, spo2 95%.

## 2015-12-02 NOTE — Progress Notes (Signed)
Pt requested bed alarm be turned off, states he will call the RN if he needs help.

## 2015-12-03 DIAGNOSIS — I5022 Chronic systolic (congestive) heart failure: Secondary | ICD-10-CM

## 2015-12-03 DIAGNOSIS — I472 Ventricular tachycardia: Secondary | ICD-10-CM

## 2015-12-03 DIAGNOSIS — J90 Pleural effusion, not elsewhere classified: Secondary | ICD-10-CM

## 2015-12-03 DIAGNOSIS — I481 Persistent atrial fibrillation: Secondary | ICD-10-CM

## 2015-12-03 DIAGNOSIS — J948 Other specified pleural conditions: Secondary | ICD-10-CM

## 2015-12-03 DIAGNOSIS — R0609 Other forms of dyspnea: Secondary | ICD-10-CM

## 2015-12-03 LAB — CBC
HCT: 32.4 % — ABNORMAL LOW (ref 39.0–52.0)
Hemoglobin: 10.8 g/dL — ABNORMAL LOW (ref 13.0–17.0)
MCH: 32.5 pg (ref 26.0–34.0)
MCHC: 33.3 g/dL (ref 30.0–36.0)
MCV: 97.6 fL (ref 78.0–100.0)
PLATELETS: 129 10*3/uL — AB (ref 150–400)
RBC: 3.32 MIL/uL — AB (ref 4.22–5.81)
RDW: 14.6 % (ref 11.5–15.5)
WBC: 4.5 10*3/uL (ref 4.0–10.5)

## 2015-12-03 LAB — BASIC METABOLIC PANEL
Anion gap: 8 (ref 5–15)
BUN: 16 mg/dL (ref 6–20)
CALCIUM: 8.4 mg/dL — AB (ref 8.9–10.3)
CHLORIDE: 107 mmol/L (ref 101–111)
CO2: 25 mmol/L (ref 22–32)
CREATININE: 1.07 mg/dL (ref 0.61–1.24)
GFR calc non Af Amer: 60 mL/min — ABNORMAL LOW (ref >60)
Glucose, Bld: 135 mg/dL — ABNORMAL HIGH (ref 65–99)
Potassium: 4.1 mmol/L (ref 3.5–5.1)
SODIUM: 140 mmol/L (ref 135–145)

## 2015-12-03 LAB — GLUCOSE, CAPILLARY
GLUCOSE-CAPILLARY: 107 mg/dL — AB (ref 65–99)
GLUCOSE-CAPILLARY: 73 mg/dL (ref 65–99)
GLUCOSE-CAPILLARY: 118 mg/dL — AB (ref 65–99)
Glucose-Capillary: 83 mg/dL (ref 65–99)

## 2015-12-03 LAB — TSH: TSH: 8.142 u[IU]/mL — AB (ref 0.350–4.500)

## 2015-12-03 MED ORDER — LEVOFLOXACIN IN D5W 750 MG/150ML IV SOLN
750.0000 mg | INTRAVENOUS | Status: AC
  Administered 2015-12-04: 750 mg via INTRAVENOUS
  Filled 2015-12-03: qty 150

## 2015-12-03 MED ORDER — ZOLPIDEM TARTRATE 5 MG PO TABS
5.0000 mg | ORAL_TABLET | Freq: Once | ORAL | Status: AC
  Administered 2015-12-04: 5 mg via ORAL
  Filled 2015-12-03: qty 1

## 2015-12-03 NOTE — Progress Notes (Signed)
MD TEXT FOR PRN FOR SLEEP. PT HAD ORDERED PRESCRIPTION  AT HOME BUT NEVER USED IT. PT REQUESTING THIS MED--BELSOMRA 10MG 

## 2015-12-03 NOTE — Consult Note (Signed)
CARDIOLOGY CONSULT NOTE   Patient ID: [name] MRN: [id number], DOB/AGE: Aug 16, 1925   Admit date: 12/02/2015 Date of Consult: 12/03/2015 Reason for Consult: Worsening EF  Primary Physician: [name], MD Primary Cardiologist: Dr. Caryl Comes  HPI: [name] is a 82 y.o. male with past medical history of PAF (Failed Tikosyn, on Eliquis for anticoagulation), Type 2 DM, HTN, and Hypothyroidism who presented to [facility] ED on 11/04/2015 for a cough and shortness of breath.  He was recently admitted to the hospital before Christmas with worsening dyspnea.   Was found to have a large pleural effusion and severe LV dysfunction with EF 25-30%. Cardiac cath showed mild - mod nonobstructive CAD  He was seen in his medical doctor's office on Jan. 2 .  Repeat CXR show a recurrent large pleural effusion and he was sent to the ER for admission      Problem List Past Medical History  Diagnosis Date  . Acid reflux disease     history of  . H/O: GI bleed   . Persistent atrial fibrillation ([place])     a. Failed tikosyn. b. DCCV did not hold 07/2015.  Marland Kitchen Benign prostatic hypertrophy     history of  . Hepatic damage march 2009    retained hepatic stone , intrahepatic duct catheter  . CAD (coronary artery disease)     a. R/LHC 11/09/15: mild nonobstructive CAD ([facility]) with well- compensated hemodynamics with low filling pressures.  . Hypertension   . 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 Seizures ([place])   . Ventricular tachycardia ? Tikosyn proarrhtyhmia   . Cataracts, bilateral   . Diabetes mellitus     insulin dep  . Pleural effusion 10/2015  . Acute on chronic systolic CHF (congestive heart failure) ([place])   . CKD (chronic kidney disease), stage II     Past Surgical History  Procedure Laterality Date  . Cardioversion    . Cholecystectomy       history of  . Partial gastrectomy      history of  . Shoulder surgery      left shoulder repair with 2 pens inserted  . Whipple procedure      operation  . Sarcoma removal from rigth tricep    . Inguinal hernia repair      inguinal herniorrhaphy, right... history of  . Appendectomy      history of  . Cardioversion  03/07/2012    Procedure: CARDIOVERSION;  Surgeon: Deboraha Sprang, MD;  Location: [place];  Service: Cardiovascular;  Laterality: N/A;  . Cardioversion N/A 08/10/2015    Procedure: CARDIOVERSION;  Surgeon: Thayer Headings, MD;  Location: [facility] ENDOSCOPY;  Service: Cardiovascular;  Laterality: N/A;  . Cardiac catheterization N/A 11/09/2015    Procedure: Right/Left Heart Cath and Coronary Angiography;  Surgeon: Jolaine Artist, MD;  Location: [place] CV LAB;  Service: Cardiovascular;  Laterality: N/A;     Allergies Allergies  Allergen Reactions  . Lisinopril Cough  . Toujeo Solostar [Insulin Glargine] Other (See Comments)    Shaking, felt bad all over  . Clindamycin Diarrhea  . Other Other (See Comments)    toujeo with perioral tingling only - pt refuses to take further  . Oxycodone-Acetaminophen Diarrhea  . Penicillins Hives    Has patient had a PCN  reaction causing immediate rash, facial/tongue/throat swelling, SOB or lightheadedness with hypotension: Yes Has patient had a PCN reaction causing severe rash involving mucus membranes or skin necrosis: no  Has patient had a PCN reaction that required hospitalization No Has patient had a PCN reaction occurring within the last 10 years: No If all of the above answers are "NO", then may proceed with Cephalosporin use.      Inpatient Medications . amiodarone  200 mg Oral BID  . azelastine  2 spray Each Nare BID  . [START ON 12/04/2015] digoxin  0.125 mg Oral QODAY  . finasteride  5 mg Oral Daily  . [START ON 12/04/2015] furosemide  20 mg Oral QODAY  . insulin aspart  0-9 Units Subcutaneous TID WC  . insulin aspart   4-8 Units Subcutaneous TID WC  . levothyroxine  25 mcg Oral QAC breakfast  . lipase/protease/amylase  12,000 Units Oral TID AC  . metoprolol tartrate  25 mg Oral BID  . tamsulosin  0.4 mg Oral BID  . vitamin C  500 mg Oral Daily    Family History Family History  Problem Relation Age of Onset  . Coronary artery disease Father 20  . Heart disease Father     fatal MI  . Osteoarthritis Mother 105  . Arthritis Mother   . Coronary artery disease Brother     cabg, avr, pvd with sents  . Heart disease Brother     CABG, PVD  . Peripheral vascular disease Sister 62  . Fibromyalgia Sister   . Arthritis Sister   . Coronary artery disease Sister   . Diabetes Sister   . Cancer Brother      Social History Social History   Social History  . Marital Status: Married    Spouse Name: N/A  . Number of Children: 3  . Years of Education: N/A   Occupational History  . 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     Social History Main Topics  . Smoking status: Former Smoker    Types: Pipe    Quit date: 03/14/1991  . Smokeless tobacco: Never Used  . Alcohol Use: No  . Drug Use: No  . Sexual Activity: Not Currently   Other Topics Concern  . Not on file   Social History Narrative   [employer] -IT sales professional. married 1954. 2 sons - [place]; 1 daughter 27. 7 grandchildren. retired- Training and development officer.  End of Life Care: DNR, no prolonged intubation (more than 2-3 days), no sustaining tube feeding, no futile or heroic measures. (Provided signed Out of Facility order and unsigned MOST form 6/112/12)     Review of Systems General:  No chills, fever, night sweats or weight changes.  Cardiovascular:  No chest pain, edema, orthopnea, palpitations, paroxysmal nocturnal dyspnea. Positive for dyspnea on exertion. Dermatological: No rash, lesions/masses Respiratory: No cough, Positive for dyspnea Urologic: No hematuria, dysuria Abdominal:   No nausea, vomiting, diarrhea, bright red blood  per rectum, melena, or hematemesis Neurologic:  No visual changes, wkns, changes in mental status. All other systems reviewed and are otherwise negative except as noted above.  Physical Exam Blood pressure 100/50, pulse 77, temperature 97.6 F (36.4 C), temperature source Oral, resp. rate 18, height 6\' 3"  (1.905 m), weight 177 lb 1.6 oz (80.332 kg), SpO2 98 %.  General: Pleasant, elderly Caucasian male in NAD Psych: Normal affect. Neuro: Alert and oriented X 3. Moves all extremities spontaneously. HEENT: Normal  Neck: Supple  without bruits. JVD elevated. Lungs:  Resp regular and unlabored, Decreased breath sounds on right.  Heart: Irregularly irregular, tachycardiac, no s3, s4, or murmurs. Abdomen: Soft, non-tender, non-distended, BS + x 4.  Extremities: No clubbing, cyanosis or edema. DP/PT/Radials 2+ and equal bilaterally.  Labs No results for input(s): CKTOTAL, CKMB, TROPONINI in the last 72 hours. Lab Results  Component Value Date   WBC 4.5 12/03/2015   HGB 10.8* 12/03/2015   HCT 32.4* 12/03/2015   MCV 97.6 12/03/2015   PLT 129* 12/03/2015    Recent Labs Lab 11/27/15 1211  12/03/15 0307  NA 134*  < > 140  K 4.6  < > 4.1  CL 100  < > 107  CO2 25  < > 25  BUN 21  < > 16  CREATININE 1.24*  < > 1.07  CALCIUM 8.4*  < > 8.4*  PROT 6.0*  --   --   BILITOT 0.5  --   --   ALKPHOS 130*  --   --   ALT 22  --   --   AST 32  --   --   GLUCOSE 54*  < > 135*  < > = values in this interval not displayed. Lab Results  Component Value Date   CHOL 114 10/21/2015   HDL 46.20 10/21/2015   LDLCALC 56 10/21/2015   TRIG 59.0 10/21/2015   Lab Results  Component Value Date   DDIMER 0.63* 11/04/2015    Radiology/Studies Dg Chest 1 View: 11/04/2015  CLINICAL DATA:  Initial encounter for status post right thoracentesis. EXAM: CHEST 1 VIEW COMPARISON:  11/04/2015, earlier the same day. FINDINGS: Interval decrease in right pleural effusion no evidence for pneumothorax. Stable appearance  of catheter tubing over the medial right hemi thorax and heart silhouette. Left lung remains clear. The cardio pericardial silhouette is enlarged. Telemetry leads overlie the chest. IMPRESSION: No evidence for pneumothorax status post thoracentesis. Electronically Signed   By: Misty Stanley M.D.   On: 11/04/2015 16:56   Dg Chest 2 View: 11/05/2015  CLINICAL DATA:  History of recent thoracentesis forl reduction of right pleural effusion, cough and congestion EXAM: CHEST  2 VIEW COMPARISON:  Portable chest x-ray of 11/04/2015 FINDINGS: After right thoracentesis, there is little change in the small right pleural effusion remaining with right basilar atelectasis. The left lung is clear. Cardiomegaly is stable. No pneumothorax is seen. IMPRESSION: No significant change after right thoracentesis in the volume of the right pleural effusion with right basilar atelectasis. Electronically Signed   By: Ivar Drape M.D.   On: 11/05/2015 08:00   Dg Chest 2 View: 11/04/2015  CLINICAL DATA:  Shortness of breath, nonproductive cough for the past week; history of coronary artery disease diabetes and bile duct malignancy; former smoker. EXAM: CHEST  2 VIEW COMPARISON:  PA and lateral chest x-ray of June 22, 2013 FINDINGS: There is a new moderate-sized right pleural effusion occupying 1/2 of the right hemithorax. Right basilar atelectasis or pneumonia may be obscured. There is small amount of atelectasis or infiltrate at the left lung base medially. A trace of pleural fluid on the left is suspected. The cardiac silhouette is mildly enlarged. The pulmonary vascularity is normal. The bony thorax exhibits no acute abnormality. IMPRESSION: Since the previous study the patient has developed a moderate sized right pleural effusion. This may be obscuring right basilar atelectasis, pneumonia, or mass. There is a trace left pleural effusion and minimal left basilar atelectasis or infiltrate. There is no  evidence of CHF. Chest CT scanning is  recommended. Electronically Signed   By: David  Martinique M.D.   On: 11/04/2015 11:30   Ct Angio Chest Pe W/cm &/or Wo Cm: 11/05/2015  CLINICAL DATA:  Shortness of breath, pleural effusion. 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 Multiplanar CT image reconstructions and MIPs were obtained to evaluate the vascular anatomy. CONTRAST:  38mL OMNIPAQUE IOHEXOL 350 MG/ML SOLN COMPARISON:  CT abdomen 12/16/ 15 images of the lung bases FINDINGS: Sagittal images of the spine shows osteopenia and degenerative changes thoracic spine. Sagittal view of the sternum is unremarkable. Atherosclerotic calcifications of thoracic aorta and coronary arteries. The study is of excellent technical quality. No pulmonary embolus is noted. Bilateral perihilar peribronchial thickening is noted. There is no mediastinal hematoma or adenopathy. Images of the upper abdomen shows partially visualized postsurgical changes post Whipple procedure. There is moderate size right pleural effusion. Small left pleural effusion. There is atelectasis in left lower lobe posteriorly. There is patchy airspace disease/infiltrate in right middle lobe and right lower lobe. Findings are highly suspicious for pneumonia. There is a catheter fragment in main pulmonary artery extending from just above the origin in right main pulmonary artery and a small branch. The catheter measures at least 12.5 cm in length. This was only partially visualized on prior exam stable in position from prior exam. Review of the MIP images confirms the above findings. IMPRESSION: 1. No pulmonary embolus. 2. There is moderate size right pleural effusion. Small left pleural effusion. Atelectasis noted in left lower lobe posteriorly. There is consolidation with air bronchogram in right lower lobe and  right middle lobe highly suspicious for infiltrate/pneumonia. 3. Mild perihilar bronchitic changes. 4. No mediastinal hematoma or adenopathy. 5. There is a catheter fragment in main pulmonary artery extending from just above the origin in right main pulmonary artery and a small branch. The catheter measures at least 12.5 cm in length. This was only partially visualized on prior exam stable in position from prior exam. These results were called by telephone at the time of interpretation on 11/05/2015 at 1:57 pm to Dr. Reyne Dumas , who verbally acknowledged these results. Electronically Signed   By: [place] Crocker M.D.   On: 11/05/2015 14:02   US Thoracentesis Asp Pleural Space W/img Guide: 11/04/2015  INDICATION: Symptomatic right sided pleural effusion EXAM: US THORACENTESIS ASP PLEURAL SPACE W/IMG GUIDE COMPARISON:  CXR 11/04/2015. MEDICATIONS: None COMPLICATIONS: None immediate TECHNIQUE: Informed written consent was obtained from the patient after a discussion of the risks, benefits and alternatives to treatment. A timeout was performed prior to the initiation of the procedure. Initial ultrasound scanning demonstrates a right pleural effusion. The lower chest was prepped and draped in the usual sterile fashion. 1% lidocaine was used for local anesthesia. An ultrasound image was saved for documentation purposes. A 6 Fr Safe-T-Centesis catheter was introduced. The thoracentesis was performed. The catheter was removed and a dressing was applied. The patient tolerated the procedure well without immediate post procedural complication. The patient was escorted to have an upright chest radiograph. FINDINGS: A total of approximately 1.4 Liters of yellow fluid was removed. Requested samples were sent to the laboratory. IMPRESSION: Successful ultrasound-guided right sided thoracentesis yielding 1.4 liters of pleural fluid. Read By:  Tsosie Billing PA-C Electronically Signed   By: Lucrezia Europe M.D.   On: 11/04/2015 16:31  ECG: personally reviewed   Atrial fibrillation with rate in low 60s   ECHOCARDIOGRAM:  Study Conclusions - Left ventricle: The cavity size was normal. Systolic function was severely reduced. The estimated ejection fraction was in the range of 25% to 30%. Diffuse hypokinesis. - Aortic valve: Trileaflet; mildly thickened, mildly calcified leaflets. Cusp separation was reduced. - Aorta: The aorta was moderately calcified. - Mitral valve: Moderately calcified annulus. There was mild regurgitation. - Left atrium: The atrium was moderately dilated. - Right ventricle: The cavity size was moderately dilated. Wall thickness was normal. Systolic function was moderately reduced. - Right atrium: The atrium was severely dilated. - Tricuspid valve: There was moderate regurgitation. - Pulmonic valve: There was moderate regurgitation.  Impressions: - EF is reduced when compared to prior study (50%).  ASSESSMENT AND PLAN  1. Acute Systolic Congestive Heart Failure His EF is low but he has not had any worsening symptoms of CHF.  In fact, he is feeling better.   He was admitted with recurrent right pleural effusion  Continue current meds.  I have reviewed the cath from his previous admission  No changes.   2. Paroxysmal Atrial Fibrillation HR is stable , Eliquis is now on hold for   chest tube / indwelling catheter   3. Type 2 DM - per admitting team  4. HTN Well controlled   5. Hypothyroidism  TSH is still high.  Plans per medical team   6. Right Pleural Effusion Has a recurrent right pleural effusion  Further plans per TCTS and IM .     Alyssa Rotondo, Wonda Cheng, MD  12/03/2015 2:21 PM    [address],  [address] [place], [place]  [id number] Pager [phone] Phone: [phone]; Fax: [phone]

## 2015-12-03 NOTE — Progress Notes (Signed)
Nutrition Brief Note  Patient identified on the Malnutrition Screening Tool (MST) Report  Wt Readings from Last 15 Encounters:  12/03/15 177 lb 1.6 oz (80.332 kg)  12/01/15 184 lb (83.462 kg)  11/27/15 175 lb (79.379 kg)  11/12/15 164 lb 3.9 oz (74.5 kg)  11/04/15 189 lb (85.73 kg)  10/21/15 186 lb 8 oz (84.596 kg)  09/30/15 183 lb 9.6 oz (83.28 kg)  09/02/15 182 lb 9.6 oz (82.827 kg)  08/14/15 188 lb (85.276 kg)  08/13/15 187 lb (84.823 kg)  08/06/15 185 lb 6.4 oz (84.097 kg)  07/28/15 182 lb 9.6 oz (82.827 kg)  06/24/15 185 lb 4 oz (84.029 kg)  04/06/15 184 lb (83.462 kg)  02/25/15 184 lb 8 oz (83.689 kg)    Body mass index is 22.14 kg/(m^2). Patient meets criteria for Normal weight based on current BMI. Pt relates recent weight loss to thoracentesis. He states that his appetite has been good and he has been eating normally with 3 meals daily.   Current diet order is Heart Healthy/Carb Modified, patient is consuming approximately 50-75% of meals at this time. He states that he receives 3 times ad much food here as he does at his ALF. He feels he is eating well and he declines snacks and supplements at this time. Labs and medications reviewed.   No nutrition interventions warranted at this time. If nutrition issues arise, please consult RD.   Scarlette Ar RD, LDN Inpatient Clinical Dietitian Pager: [phone] After Hours Pager: [phone]

## 2015-12-03 NOTE — Consult Note (Signed)
[address]       [place] [address]             [phone]        Reason for Consult: Recurrent right pleural effusion Referring Physician: Dr. Estill Cotta  [name] is an 82 y.o. male.  HPI:   He has a history of PAF on Eliquis, Type 2 DM, HTN, hypothyroidism and chronic systolic heart failure with an EF of 25-30% by recent echo on 11/05/2015. There was mild MR, moderate PI and moderate TR. The RV was moderately dilated with moderate systolic dysfunction. He was admitted in December with shortness of breath and a CT of the chest that showed a moderate right pleural effusion and possible right middle and lower lobe pneumonia. He had a right thoracentesis of 1.4 L on 12/7 and then 1.6 L on 12/9. Cytology was negative for malignancy and it appeared to be a transudate felt to be related to heart failure. He was treated empirically with Levaquin although he had no fever or leukocytosis. He has been back at [facility] where he lives with his wife and has now developed progressive shortness of breath. CXR now shows recurrent right pleural effusion of moderate size. He has had no fever. He has non-productive cough, no orthopnea or PND. Mild LE edema.  Past Medical History  Diagnosis Date  . Acid reflux disease     history of  . H/O: GI bleed   . Persistent atrial fibrillation ([name])     a. Failed tikosyn. b. DCCV did not hold 07/2015.  Marland Kitchen Benign prostatic hypertrophy     history of  . Hepatic damage march 2009    retained hepatic stone , intrahepatic duct catheter  . CAD (coronary artery disease)     a. R/LHC 11/09/15: mild nonobstructive CAD ([facility]) with well- compensated hemodynamics with low filling pressures.  . Hypertension   . 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 Seizures ([place])   . Ventricular tachycardia ?  Tikosyn proarrhtyhmia   . Cataracts, bilateral   . Diabetes mellitus     insulin dep  . Pleural effusion 10/2015  . Acute on chronic systolic CHF (congestive heart failure) ([place])   . CKD (chronic kidney disease), stage II     Past Surgical History  Procedure Laterality Date  . Cardioversion    . Cholecystectomy      history of  . Partial gastrectomy      history of  . Shoulder surgery      left shoulder repair with 2 pens inserted  . Whipple procedure      operation  . Sarcoma removal from rigth tricep    . Inguinal hernia repair      inguinal herniorrhaphy, right... history of  . Appendectomy      history of  . Cardioversion  03/07/2012    Procedure: CARDIOVERSION;  Surgeon: Deboraha Sprang, MD;  Location: [facility];  Service: Cardiovascular;  Laterality: N/A;  . Cardioversion N/A 08/10/2015    Procedure: CARDIOVERSION;  Surgeon: Thayer Headings, MD;  Location: [facility] ENDOSCOPY;  Service: Cardiovascular;  Laterality: N/A;  . Cardiac catheterization N/A 11/09/2015    Procedure: Right/Left Heart Cath and Coronary Angiography;  Surgeon: Jolaine Artist, MD;  Location: [facility] CV LAB;  Service: Cardiovascular;  Laterality: N/A;    Family History  Problem Relation Age of Onset  . Coronary artery disease Father 74  . Heart disease Father     fatal MI  . Osteoarthritis Mother 57  . Arthritis Mother   . Coronary artery disease Brother     cabg, avr, pvd with sents  . Heart disease Brother     CABG, PVD  . Peripheral vascular disease Sister 34  . Fibromyalgia Sister   . Arthritis Sister   . Coronary artery disease Sister   . Diabetes Sister   . Cancer Brother     Social History:  reports that he quit smoking about 24 years ago. His smoking use included Pipe. He has never used smokeless tobacco. He reports that he does not drink alcohol or use illicit drugs.  Allergies:  Allergies  Allergen Reactions  . Lisinopril Cough  . Toujeo Solostar [Insulin Glargine] Other (See  Comments)    Shaking, felt bad all over  . Clindamycin Diarrhea  . Other Other (See Comments)    toujeo with perioral tingling only - pt refuses to take further  . Oxycodone-Acetaminophen Diarrhea  . Penicillins Hives    Has patient had a PCN reaction causing immediate rash, facial/tongue/throat swelling, SOB or lightheadedness with hypotension: Yes Has patient had a PCN reaction causing severe rash involving mucus membranes or skin necrosis: no  Has patient had a PCN reaction that required hospitalization No Has patient had a PCN reaction occurring within the last 10 years: No If all of the above answers are "NO", then may proceed with Cephalosporin use.    Medications:  I have reviewed the patient's current medications. Prior to Admission:  Prescriptions prior to admission  Medication Sig Dispense Refill Last Dose  . amiodarone (PACERONE) 200 MG tablet Take 1 tablet (200 mg total) by mouth 2 (two) times daily. 180 tablet 2 12/02/2015 at Unknown time  . apixaban (ELIQUIS) 2.5 MG TABS tablet Take 1 tablet (2.5 mg total) by mouth 2 (two) times daily. 60 tablet 2 12/02/2015 at Unknown time  . Ascorbic Acid (VITAMIN C) 500 MG tablet Take 500 mg by mouth daily.    12/02/2015 at Unknown time  . azelastine (ASTELIN) 0.1 % nasal spray Place 2 sprays into both nostrils 2 (two) times daily. Use in each nostril as directed 90 mL 3 12/02/2015 at Unknown time  . [facility] GLUCOSAMINE-CHONDROITIN PO Take 1 tablet by mouth daily. 1200/600 mg   12/02/2015 at Unknown time  . digoxin (LANOXIN) 0.125 MG tablet Take 1 tablet (0.125 mg total) by mouth every other day. 90 tablet 2 12/02/2015 at Unknown time  . Emollient (AMLACTIN XL) LOTN Apply 1 application topically daily. Apply lotion to legs   12/02/2015 at Unknown time  . finasteride (PROSCAR) 5 MG tablet Take 1 tablet (5 mg total) by mouth daily. 90 tablet 3 12/02/2015 at Unknown time  . furosemide (LASIX) 20 MG tablet Take 1 tablet (20 mg total) by mouth every other day. 45  tablet 3 12/02/2015 at Unknown time  . insulin aspart (NOVOLOG FLEXPEN) 100 UNIT/ML FlexPen Inject 4-8 Units into the skin 3 (three) times daily with meals. Inject 5 units subcutaneously with breakfast, 3 units with lunch and 5 units with supper   12/02/2015 at Unknown time  . levothyroxine (SYNTHROID, LEVOTHROID) 25 MCG tablet Take 1 tablet (25 mcg total) by mouth daily before breakfast. 30 tablet 5 12/02/2015 at Unknown time  . lipase/protease/amylase (CREON) 12000 UNITS CPEP capsule  Take 1 capsule (12,000 Units total) by mouth 3 (three) times daily before meals. 270 capsule 3 12/02/2015 at Unknown time  . metoprolol tartrate (LOPRESSOR) 25 MG tablet Take 1 tablet (25 mg total) by mouth 2 (two) times daily. 180 tablet 3 12/02/2015 at 0730  . Multiple Vitamins-Minerals (CENTRUM SILVER PO) Take 1 tablet by mouth daily.    12/02/2015 at Unknown time  . Probiotic Product (PROBIOTIC DAILY PO) Take 3 tablets by mouth daily. 3 tabs once a day with meals.   12/02/2015 at Unknown time  . tamsulosin (FLOMAX) 0.4 MG CAPS capsule Take 1 capsule (0.4 mg total) by mouth 2 (two) times daily. 180 capsule 3 12/02/2015 at Unknown time  . Insulin Pen Needle (B-D UF III MINI PEN NEEDLES) 31G X 5 MM MISC Inject 120 Devices into the skin 4 (four) times daily. Use a new needle for injecting insulin4 times a day 360 each 3 Taking  . SALINE NA Place 1 spray into both nostrils 2 (two) times daily. Reported on 12/02/2015   Not Taking at Unknown time  . Suvorexant (BELSOMRA) 10 MG TABS Take 1 tablet by mouth at bedtime as needed. (Patient not taking: Reported on 12/02/2015) 30 tablet 5 Not Taking at Unknown time   Scheduled: . amiodarone  200 mg Oral BID  . azelastine  2 spray Each Nare BID  . [START ON 12/04/2015] digoxin  0.125 mg Oral QODAY  . finasteride  5 mg Oral Daily  . [START ON 12/04/2015] furosemide  20 mg Oral QODAY  . insulin aspart  0-9 Units Subcutaneous TID WC  . insulin aspart  4-8 Units Subcutaneous TID WC  . levothyroxine  25  mcg Oral QAC breakfast  . lipase/protease/amylase  12,000 Units Oral TID AC  . metoprolol tartrate  25 mg Oral BID  . tamsulosin  0.4 mg Oral BID  . vitamin C  500 mg Oral Daily   Continuous:  [id number] **OR** ondansetron (ZOFRAN) IV Anti-infectives    None      Results for orders placed or performed during the hospital encounter of 12/02/15 (from the past 48 hour(s))  Basic metabolic panel     Status: Abnormal   Collection Time: 12/02/15  2:01 PM  Result Value Ref Range   Sodium 140 135 - 145 mmol/L   Potassium 5.0 3.5 - 5.1 mmol/L   Chloride 107 101 - 111 mmol/L   CO2 25 22 - 32 mmol/L   Glucose, Bld 138 (H) 65 - 99 mg/dL   BUN 19 6 - 20 mg/dL   Creatinine, Ser 1.30 (H) 0.61 - 1.24 mg/dL   Calcium 8.7 (L) 8.9 - 10.3 mg/dL   GFR calc non Af Amer 47 (L) >60 mL/min   GFR calc Af Amer 55 (L) >60 mL/min    Comment: (NOTE) The eGFR has been calculated using the CKD EPI equation. This calculation has not been validated in all clinical situations. eGFR's persistently <60 mL/min signify possible Chronic Kidney Disease.    Anion gap 8 5 - 15  CBC     Status: Abnormal   Collection Time: 12/02/15  2:01 PM  Result Value Ref Range   WBC 5.9 4.0 - 10.5 K/uL   RBC 3.90 (L) 4.22 - 5.81 MIL/uL   Hemoglobin 12.3 (L) 13.0 - 17.0 g/dL   HCT 37.4 (L) 39.0 - 52.0 %   MCV 95.9 78.0 - 100.0 fL   MCH 31.5 26.0 - 34.0 pg   MCHC 32.9 30.0 - 36.0 g/dL  RDW 14.5 11.5 - 15.5 %   Platelets 149 (L) 150 - 400 K/uL  I-stat troponin, ED (not at [facility])     Status: None   Collection Time: 12/02/15  2:27 PM  Result Value Ref Range   Troponin i, poc 0.03 0.00 - 0.08 ng/mL   Comment 3            Comment: Due to the release kinetics of cTnI, a negative result within the first hours of the onset of symptoms does not rule out myocardial infarction with certainty. If myocardial infarction is still suspected, repeat the test at appropriate intervals.   Glucose, capillary     Status: Abnormal    Collection Time: 12/02/15 10:20 PM  Result Value Ref Range   Glucose-Capillary 194 (H) 65 - 99 mg/dL   Comment 1 Notify RN    Comment 2 Document in Chart   Basic metabolic panel     Status: Abnormal   Collection Time: 12/03/15  3:07 AM  Result Value Ref Range   Sodium 140 135 - 145 mmol/L   Potassium 4.1 3.5 - 5.1 mmol/L    Comment: DELTA CHECK NOTED   Chloride 107 101 - 111 mmol/L   CO2 25 22 - 32 mmol/L   Glucose, Bld 135 (H) 65 - 99 mg/dL   BUN 16 6 - 20 mg/dL   Creatinine, Ser 1.07 0.61 - 1.24 mg/dL   Calcium 8.4 (L) 8.9 - 10.3 mg/dL   GFR calc non Af Amer 60 (L) >60 mL/min   GFR calc Af Amer >60 >60 mL/min    Comment: (NOTE) The eGFR has been calculated using the CKD EPI equation. This calculation has not been validated in all clinical situations. eGFR's persistently <60 mL/min signify possible Chronic Kidney Disease.    Anion gap 8 5 - 15  CBC     Status: Abnormal   Collection Time: 12/03/15  3:07 AM  Result Value Ref Range   WBC 4.5 4.0 - 10.5 K/uL   RBC 3.32 (L) 4.22 - 5.81 MIL/uL   Hemoglobin 10.8 (L) 13.0 - 17.0 g/dL   HCT 32.4 (L) 39.0 - 52.0 %   MCV 97.6 78.0 - 100.0 fL   MCH 32.5 26.0 - 34.0 pg   MCHC 33.3 30.0 - 36.0 g/dL   RDW 14.6 11.5 - 15.5 %   Platelets 129 (L) 150 - 400 K/uL  Glucose, capillary     Status: Abnormal   Collection Time: 12/03/15  6:12 AM  Result Value Ref Range   Glucose-Capillary 107 (H) 65 - 99 mg/dL  TSH     Status: Abnormal   Collection Time: 12/03/15 10:04 AM  Result Value Ref Range   TSH 8.142 (H) 0.350 - 4.500 uIU/mL  Glucose, capillary     Status: None   Collection Time: 12/03/15 10:26 AM  Result Value Ref Range   Glucose-Capillary 73 65 - 99 mg/dL    Dg Chest Port 1 View  12/02/2015  CLINICAL DATA:  Progressive shortness of breath. History of right pleural effusion. EXAM: PORTABLE CHEST 1 VIEW COMPARISON:  Frontal and lateral views yesterday. Chest CT 11/05/2015 FINDINGS: Catheter in the right hemithorax, corresponding  to right pulmonary artery on CT unchanged in position. Progressive right pleural effusion and hazy opacity throughout the right hemithorax. Stable blunting of left costophrenic angle. Cardiomediastinal contours are unchanged. No pneumothorax. The cortex of the right proximal humerus appears irregular, only partially included in the field of view. IMPRESSION: 1. Increasing right  pleural effusion, moderate to large in size. 2. Question of irregular cortex right proximal humerus, only partially included. Dedicated humerus radiographs recommended, as well as correlation for any pain in this region. Electronically Signed   By: Jeb Levering M.D.   On: 12/02/2015 22:01    Review of Systems  Constitutional: Positive for malaise/fatigue. Negative for fever, chills and weight loss.  HENT: Negative.   Eyes: Negative.   Respiratory: Positive for cough and shortness of breath. Negative for hemoptysis and sputum production.   Cardiovascular: Positive for leg swelling. Negative for chest pain, orthopnea and PND.  Gastrointestinal: Negative.   Genitourinary: Negative.   Musculoskeletal: Negative.   Skin: Negative.   Neurological: Negative.   Endo/Heme/Allergies: Negative.   Psychiatric/Behavioral: Negative.    Blood pressure 100/50, pulse 77, temperature 97.6 F (36.4 C), temperature source Oral, resp. rate 18, height _0  (1.905 m), weight 80.332 kg (177 lb 1.6 oz), SpO2 98 %. Physical Exam  Constitutional: He is oriented to person, place, and time.  Elderly, frail-appearing gentleman in no distress.  HENT:  Head: Normocephalic and atraumatic.  Eyes: EOM are normal. Pupils are equal, round, and reactive to light.  Neck: Normal range of motion. Neck supple. JVD present. No thyromegaly present.  Cardiovascular: Normal heart sounds.   No murmur heard. Irregularly irregular rate and rhythm  Respiratory: Effort normal. No respiratory distress.  Absent breath sounds over the RLL  GI: Soft. Bowel  sounds are normal. He exhibits no distension and no mass. There is no tenderness.  Musculoskeletal: Normal range of motion. He exhibits edema. He exhibits no tenderness.  Lymphadenopathy:    He has no cervical adenopathy.  Neurological: He is alert and oriented to person, place, and time.  Skin: Skin is warm and dry.  Psychiatric: He has a normal mood and affect.   CLINICAL DATA: Progressive shortness of breath. History of right pleural effusion.  EXAM: PORTABLE CHEST 1 VIEW  COMPARISON: Frontal and lateral views yesterday. Chest CT 11/05/2015  FINDINGS: Catheter in the right hemithorax, corresponding to right pulmonary artery on CT unchanged in position. Progressive right pleural effusion and hazy opacity throughout the right hemithorax. Stable blunting of left costophrenic angle. Cardiomediastinal contours are unchanged. No pneumothorax. The cortex of the right proximal humerus appears irregular, only partially included in the field of view.  IMPRESSION: 1. Increasing right pleural effusion, moderate to large in size. 2. Question of irregular cortex right proximal humerus, only partially included. Dedicated humerus radiographs recommended, as well as correlation for any pain in this region.   Electronically Signed  By: Jeb Levering M.D.  On: 12/02/2015 22:01   Assessment/Plan:  He has a recurrent right pleural effusion that is likely due to chronic biventricular systolic heart failure. He has had two thoracenteses last month for a total of 3L of effusion. I think the best option at this point is to place a right PleurX catheter to allow outpatient management of the effusion. Hopefully this will allow resolution of the effusion and the catheter can be removed after a few weeks. If the effusion does not resolve then I would consider a talc pleurodesis through the catheter. I discussed the procedure with the patient and his family including alternatives, benefits,  and risks including but not limited to bleeding, infection, catheter malfunction, lung injury, recurrent effusion and he understands and agrees to proceed. I will do this tomorrow afternoon. He will need to have this drained at [facility] daily and then I will follow him in the  office.  Gaye Pollack 12/03/2015, 4:28 PM

## 2015-12-03 NOTE — Progress Notes (Signed)
[facility]                                                                              Patient Demographics  [name], is a 82 y.o. male, DOB - 1925-01-13, [id number]  Admit date - 12/02/2015   Admitting Physician Rise Patience, MD  Outpatient Primary MD for the patient is [name], MD  LOS - 1   Chief Complaint  Patient presents with  . Shortness of Breath       Brief HPI   [name] is a 82 y.o. male with history of chronic systolic chf heart failure, atrial fibrillation, hypothyroidism, diabetes mellitus, chronic anemia who was admitted in December 2016 for pleural effusion and has had thoracentesis twice presented to the ER because of dyspnea and chest x-ray showing recurrence of pleural effusion. Patient reported that he has been getting short of breath progressively after discharge last month and his PCP had ordered chest x-ray which showed recurrence of pleural effusion and was advised to come to the ER. Chest x-ray showed worsening of pleural effusion and patient was admitted for thoracentesis. Patient denied any chest pain. Has some nonproductive cough. Denied any fever or chills. Patient does have mild edema of the lower extremities.   Assessment & Plan     Recurrent right-sided pleural effusion - cause possibly due to chronic systolic CHF, entirely not clear.  - Patient had last thoracentesis on 12/9, 12/7, cytology showed no malignancy. CT chest had shown no malignancy.  - continue to hold Apixaban.  last dose on 12/02/15 a.m.  - Due to frequent recurrent pleural effusion, I have requested for cardiology consult and cardiothoracic surgery consult. Discussed with Dr. Mohammed Kindle, who felt Pleurx catheter may be a better choice at this time.  - Chest x-ray shows possible opacity but no signs of infection.    Chronic atrial fibrillation - rate controlled.  - Patient's Apixaban is on hold for thoracentesis. Patient is agreeable.  -  Continue amiodarone metoprolol and digoxin.   Chronic systolic heart failure last EF measured was 25% - - continue Lasix.  cardiology consulted, may need to be on a higher dose of Lasix, will follow recommendations    Diabetes mellitus type 2 - -  continue home medications with sliding scale coverage.  Chronic anemia - follow CBC.  Hypothyroidism  - Continue  Synthroid.  History of malignant neoplasm of the gallbladder.  Code Status: full CODE STATUS  Family Communication: Discussed in detail with the patient, all imaging results, lab results explained to the patient    Disposition Plan:   Time Spent in minutes 25 minutes  Procedures  None   Consults   Cardiology CTVS  DVT Prophylaxis   SCD's, apixaban on hold   Medications  Scheduled Meds: . amiodarone  200 mg Oral BID  . azelastine  2 spray Each Nare BID  . [START ON 12/04/2015] digoxin  0.125 mg Oral QODAY  . finasteride  5 mg Oral Daily  . [START ON 12/04/2015] furosemide  20 mg Oral QODAY  . insulin aspart  0-9 Units Subcutaneous TID WC  . insulin aspart  4-8 Units Subcutaneous TID WC  . levothyroxine  25 mcg Oral QAC breakfast  . lipase/protease/amylase  12,000 Units Oral TID AC  . metoprolol tartrate  25 mg Oral BID  . tamsulosin  0.4 mg Oral BID  . vitamin C  500 mg Oral Daily   Continuous Infusions:     Antibiotics   Anti-infectives    None        Subjective:   [name] was seen and examined today.  Shortness of breath with exertion, otherwise denies any chest pain.  Patient denies dizziness, abdominal pain, N/V/D/C, new weakness, numbess, tingling. No acute events overnight.    Objective:   Blood pressure 100/50, pulse 77, temperature 97.6 F (36.4 C), temperature source Oral, resp. rate 18, height 6\' 3"  (1.905 m), weight 80.332 kg (177 lb 1.6 oz), SpO2 98 %.  Wt Readings from Last 3 Encounters:  12/03/15 80.332 kg (177 lb 1.6 oz)  12/01/15 83.462 kg (184 lb)  11/27/15 79.379 kg (175  lb)     Intake/Output Summary (Last 24 hours) at 12/03/15 1238 Last data filed at 12/03/15 0900  Gross per 24 hour  Intake    220 ml  Output    300 ml  Net    -80 ml    Exam  General: Alert and oriented x 3, NAD  HEENT:  PERRLA, EOMI, Anicteric Sclera, mucous membranes moist.   Neck: Supple, no JVD, no masses  CVS: S1 S2 auscultated, no rubs, murmurs or gallops. Regular rate and rhythm.  Respiratory: decreased breath sound, Rt >LT  Abdomen: Soft, nontender, nondistended, + bowel sounds  Ext: no cyanosis clubbing or edema  Neuro: AAOx3, Cr N's II- XII. Strength 5/5 upper and lower extremities bilaterally  Skin: No rashes  Psych: Normal affect and demeanor, alert and oriented x3    Data Review   Micro Results No results found for this or any previous visit (from the past 240 hour(s)).  Radiology Reports Dg Chest 1 View  11/06/2015  CLINICAL DATA:  Post right thoracentesis EXAM: CHEST 1 VIEW COMPARISON:  11/05/2015 FINDINGS: Cardiomediastinal silhouette is stable. The right pleural effusion has resolved. Mild bilateral basilar atelectasis left greater than right. Right hilar catheter is stable in position. No segmental infiltrate or pulmonary edema. No pneumothorax. IMPRESSION: The right pleural effusion has resolved. Mild bilateral basilar atelectasis left greater than right. Right hilar catheter is stable in position. No segmental infiltrate or pulmonary edema. No pneumothorax. Electronically Signed   By: Lahoma Crocker M.D.   On: 11/06/2015 17:13   Dg Chest 1 View  11/04/2015  CLINICAL DATA:  Initial encounter for status post right thoracentesis. EXAM: CHEST 1 VIEW COMPARISON:  11/04/2015, earlier the same day. FINDINGS: Interval decrease in right pleural effusion no evidence for pneumothorax. Stable appearance of catheter tubing over the medial right hemi thorax and heart silhouette. Left lung remains clear. The cardio pericardial silhouette is enlarged. Telemetry leads  overlie the chest. IMPRESSION: No evidence for pneumothorax status post thoracentesis. Electronically Signed   By: Misty Stanley M.D.   On: 11/04/2015 16:56   Dg Chest 2 View  12/01/2015  CLINICAL DATA:  Follow up right pleural effusion EXAM: CHEST  2 VIEW COMPARISON:  November 09, 2015 FINDINGS: No pneumothorax. The catheter in the right pulmonary artery is stable. There is a right-sided pleural effusion with underlying opacity, increased in the interval. The effusion is now moderate in size. No other interval changes or acute abnormalities identified. IMPRESSION: Increasing right-sided pleural effusion  with underlying opacity. Electronically Signed   By: Dorise Bullion III M.D   On: 12/01/2015 15:18   Dg Chest 2 View  11/05/2015  CLINICAL DATA:  History of recent thoracentesis forl reduction of right pleural effusion, cough and congestion EXAM: CHEST  2 VIEW COMPARISON:  Portable chest x-ray of 11/04/2015 FINDINGS: After right thoracentesis, there is little change in the small right pleural effusion remaining with right basilar atelectasis. The left lung is clear. Cardiomegaly is stable. No pneumothorax is seen. IMPRESSION: No significant change after right thoracentesis in the volume of the right pleural effusion with right basilar atelectasis. Electronically Signed   By: Ivar Drape M.D.   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 EXAM: CHEST  2 VIEW COMPARISON:  PA and lateral chest x-ray of June 22, 2013 FINDINGS: There is a new moderate-sized right pleural effusion occupying 1/2 of the right hemithorax. Right basilar atelectasis or pneumonia may be obscured. There is small amount of atelectasis or infiltrate at the left lung base medially. A trace of pleural fluid on the left is suspected. The cardiac silhouette is mildly enlarged.  The pulmonary vascularity is normal. The bony thorax exhibits no acute abnormality. IMPRESSION: Since the previous study the patient has developed a moderate sized right pleural effusion. This may be obscuring right basilar atelectasis, pneumonia, or mass. There is a trace left pleural effusion and minimal left basilar atelectasis or infiltrate. There is no evidence of CHF. Chest CT scanning is recommended. Electronically Signed   By: David  Martinique M.D.   On: 11/04/2015 11:30   Ct Angio Chest Pe W/cm &/or Wo Cm  11/05/2015  CLINICAL DATA:  Shortness of breath, pleural effusion. 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 Multiplanar CT image reconstructions and MIPs were obtained to evaluate the vascular anatomy. CONTRAST:  64mL OMNIPAQUE IOHEXOL 350 MG/ML SOLN COMPARISON:  CT abdomen 12/16/ 15 images of the lung bases FINDINGS: Sagittal images of the spine shows osteopenia and degenerative changes thoracic spine. Sagittal view of the sternum is unremarkable. Atherosclerotic calcifications of thoracic aorta and coronary arteries. The study is of excellent technical quality. No pulmonary embolus is noted. Bilateral perihilar peribronchial thickening is noted. There is no mediastinal hematoma or adenopathy. Images of the upper abdomen shows partially visualized postsurgical changes post Whipple procedure. There is moderate size right pleural effusion. Small left pleural effusion. There is atelectasis in left lower lobe posteriorly. There is patchy airspace disease/infiltrate in right middle lobe and right lower lobe. Findings are highly suspicious for pneumonia. There is a catheter fragment in main pulmonary artery extending from just above the origin in right main pulmonary artery and a small branch. The catheter measures at  least 12.5 cm in length. This was only partially visualized on prior exam stable in position from prior exam. Review of the MIP images confirms the above findings. IMPRESSION: 1. No pulmonary embolus. 2. There is moderate size right pleural effusion. Small left pleural effusion. Atelectasis noted in left lower lobe posteriorly. There is consolidation with air bronchogram in right lower lobe and right middle lobe highly suspicious for infiltrate/pneumonia. 3. Mild perihilar bronchitic changes. 4. No mediastinal hematoma or adenopathy. 5. There is a  catheter fragment in main pulmonary artery extending from just above the origin in right main pulmonary artery and a small branch. The catheter measures at least 12.5 cm in length. This was only partially visualized on prior exam stable in position from prior exam. These results were called by telephone at the time of interpretation on 11/05/2015 at 1:57 pm to Dr. Reyne Dumas , who verbally acknowledged these results. Electronically Signed   By: Lahoma Crocker M.D.   On: 11/05/2015 14:02   Dg Chest Port 1 View  12/02/2015  CLINICAL DATA:  Progressive shortness of breath. History of right pleural effusion. EXAM: PORTABLE CHEST 1 VIEW COMPARISON:  Frontal and lateral views yesterday. Chest CT 11/05/2015 FINDINGS: Catheter in the right hemithorax, corresponding to right pulmonary artery on CT unchanged in position. Progressive right pleural effusion and hazy opacity throughout the right hemithorax. Stable blunting of left costophrenic angle. Cardiomediastinal contours are unchanged. No pneumothorax. The cortex of the right proximal humerus appears irregular, only partially included in the field of view. IMPRESSION: 1. Increasing right pleural effusion, moderate to large in size. 2. Question of irregular cortex right proximal humerus, only partially included. Dedicated humerus radiographs recommended, as well as correlation for any pain in this region. Electronically Signed    By: Jeb Levering M.D.   On: 12/02/2015 22:01   Dg Chest Port 1 View  11/09/2015  CLINICAL DATA:  Short of breath, chest pain EXAM: PORTABLE CHEST 1 VIEW COMPARISON:  Radiograph 11/06/2015, 10/30/2012 FINDINGS: There is a catheter in the RIGHT main pulmonary artery unchanged from 10/30/2012. Normal cardiac silhouette. There is increased airspace density in the RIGHT lower lobe. Small RIGHT effusion. No pneumothorax. IMPRESSION: Increasing RIGHT lower lobe airspace disease and small effusions concerning for pneumonia Electronically Signed   By: Suzy Bouchard M.D.   On: 11/09/2015 14:45   Dg Swallowing Func-speech Pathology  11/06/2015  Objective Swallowing Evaluation:   Patient Details Name: [name] MRN: [id number] Date of Birth: January 27, 1925 Today's Date: 11/06/2015 Time: SLP Start Time (ACUTE ONLY): 1009-SLP Stop Time (ACUTE ONLY): 1020 SLP Time Calculation (min) (ACUTE ONLY): 11 min Past Medical History: Past Medical History Diagnosis Date . Acid reflux disease    history of . H/O: GI bleed  . Paroxysmal atrial fibrillation (HCC)    chronic anticoag; tikosyn . Benign prostatic hypertrophy    history of . Hepatic damage march 2009   retained hepatic stone , intrahepatic duct catheter . CAD (coronary artery disease)  . Hypertension  . 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 Seizures ([place])  . Ventricular tachycardia ? Tikosyn proarrhtyhmia  . Cataracts, bilateral  . Diabetes mellitus    insulin dep . Pleural effusion 10/2015 . Shortness of breath dyspnea  Past Surgical History: Past Surgical History Procedure Laterality Date . Cardioversion   . Cholecystectomy     history of . Partial gastrectomy     history of . Shoulder surgery     left shoulder repair with 2 pens inserted . Whipple procedure     operation . 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 Inguinal hernia  repair     inguinal herniorrhaphy, right... history of . Appendectomy     history of . Cardioversion  03/07/2012   Procedure: CARDIOVERSION;  Surgeon: Deboraha Sprang, MD;  Location: [facility];  Service: Cardiovascular;  Laterality:  N/A; . Cardioversion N/A 08/10/2015   Procedure: CARDIOVERSION;  Surgeon: Thayer Headings, MD;  Location[facility] ENDOSCOPY;  Service: Cardiovascular;  Laterality: N/A; HPI: [name] is a 82 y.o. male, with permanent atrial fibrillation on Eliquis, diabetes mellitus, seizures, and history of Whipple procedure who presents to the emergency department with shortness of breath. [name] saw his primary care physician for cough and shortness of breath and was sent to the emergency department after an x-ray showed a large right pleural effusion. Mr. Manjarres reports that he has been feeling fatigued and badly for about the past month. But over the last week he has become much worse. He is no longer able to walk his daily 1/2 mile. He is unable to sleep. He has a dry cough but occasionally brings up frothy sputum. He also coughs when he eats or drinks liquids,  No Data Recorded Assessment / Plan / Recommendation CHL IP CLINICAL IMPRESSIONS 11/06/2015 Therapy Diagnosis Moderate pharyngeal phase dysphagia Clinical Impression Pt presents with moderate pharyngeal phase dysphagia characterized by one incidence of flash penetration with larger boluses of liquids, but clearance noted without strategies utilized; pt also exhibited moderate vallecular residue with puree-solid consistencies which required multiple repetitive swallows, effortful swallows and alternating solids/liquids to clear valleculae which place him at a moderate aspiration risk without use of strategies during po intake; decreased epiglottic inversion noted intermittently during study as well which also increases his aspiration risk.  Pt did not aspirate any consistency, but is certainly at risk if strategies are not utilized during po  intake; ST to f/u during hospitalization for diet tolerance/pt education re: aspiration risk/swallowing strategy use and should be f/u with SLP at SNF. Impact on safety and function Moderate aspiration risk   CHL IP TREATMENT RECOMMENDATION 11/06/2015 Treatment Recommendations Therapy as outlined in treatment plan below   Prognosis 11/06/2015 Prognosis for Safe Diet Advancement Good Barriers to Reach Goals -- Barriers/Prognosis Comment -- CHL IP DIET RECOMMENDATION 11/06/2015 SLP Diet Recommendations Regular solids;Thin liquid Liquid Administration via Cup Medication Administration Whole meds with liquid Compensations Slow rate;Small sips/bites;Clear throat intermittently;Effortful swallow Postural Changes Seated upright at 90 degrees   CHL IP OTHER RECOMMENDATIONS 11/06/2015 Recommended Consults -- Oral Care Recommendations Oral care BID Other Recommendations --   CHL IP FOLLOW UP RECOMMENDATIONS 11/05/2015 Follow up Recommendations ST f/u at SNF upon d/c   CHL IP FREQUENCY AND DURATION 11/06/2015 Speech Therapy Frequency (ACUTE ONLY) min 2x/week Treatment Duration 1 week      CHL IP ORAL PHASE 11/06/2015 Oral Phase WFL Oral - Pudding Teaspoon -- Oral - Pudding Cup -- Oral - Honey Teaspoon -- Oral - Honey Cup -- Oral - Nectar Teaspoon -- Oral - Nectar Cup -- Oral - Nectar Straw -- Oral - Thin Teaspoon -- Oral - Thin Cup -- Oral - Thin Straw -- Oral - Puree -- Oral - Mech Soft -- Oral - Regular -- Oral - Multi-Consistency -- Oral - Pill -- Oral Phase - Comment --  CHL IP PHARYNGEAL PHASE 11/06/2015 Pharyngeal Phase Impaired Pharyngeal- Pudding Teaspoon -- Pharyngeal -- Pharyngeal- Pudding Cup -- Pharyngeal -- Pharyngeal- Honey Teaspoon -- Pharyngeal -- Pharyngeal- Honey Cup -- Pharyngeal -- Pharyngeal- Nectar Teaspoon -- Pharyngeal -- Pharyngeal- Nectar Cup -- Pharyngeal -- Pharyngeal- Nectar Straw -- Pharyngeal -- Pharyngeal- Thin Teaspoon -- Pharyngeal -- Pharyngeal- Thin Cup Penetration/Aspiration during swallow;Other  (Comment) Pharyngeal (No Data) Pharyngeal- Thin Straw -- Pharyngeal -- Pharyngeal- Puree Reduced epiglottic inversion;Pharyngeal residue - valleculae;Compensatory strategies attempted (with notebox) Pharyngeal -- Pharyngeal-  Mechanical Soft -- Pharyngeal -- Pharyngeal- Regular Reduced epiglottic inversion;Pharyngeal residue - valleculae;Compensatory strategies attempted (with notebox) Pharyngeal -- Pharyngeal- Multi-consistency -- Pharyngeal -- Pharyngeal- Pill -- Pharyngeal -- Pharyngeal Comment --  CHL IP CERVICAL ESOPHAGEAL PHASE 11/06/2015 Cervical Esophageal Phase WFL Pudding Teaspoon -- Pudding Cup -- Honey Teaspoon -- Honey Cup -- Nectar Teaspoon -- Nectar Cup -- Nectar Straw -- Thin Teaspoon -- Thin Cup -- Thin Straw -- Puree -- Mechanical Soft -- Regular -- Multi-consistency -- Pill -- Cervical Esophageal Comment -- ADAMS,PAT, M.S., CCC-SLP 11/06/2015, 10:31 AM              US Thoracentesis Asp Pleural Space W/img Guide  11/06/2015  Ardis Rowan, PA-C     11/06/2015  4:37 PM Successful US guided right thoracentesis. Yielded 1.6 liters of clear yellow fluid. Pt tolerated procedure well. No immediate complications. Specimen was not sent for labs. CXR ordered. Abigail Butts S BLAIR PA-C 11/06/2015 4:36 PM   US Thoracentesis Asp Pleural Space W/img Guide  11/04/2015  INDICATION: Symptomatic right sided pleural effusion EXAM: US THORACENTESIS ASP PLEURAL SPACE W/IMG GUIDE COMPARISON:  CXR 11/04/2015. MEDICATIONS: None COMPLICATIONS: None immediate TECHNIQUE: Informed written consent was obtained from the patient after a discussion of the risks, benefits and alternatives to treatment. A timeout was performed prior to the initiation of the procedure. Initial ultrasound scanning demonstrates a right pleural effusion. The lower chest was prepped and draped in the usual sterile fashion. 1% lidocaine was used for local anesthesia. An ultrasound image was saved for documentation purposes. A 6 Fr Safe-T-Centesis  catheter was introduced. The thoracentesis was performed. The catheter was removed and a dressing was applied. The patient tolerated the procedure well without immediate post procedural complication. The patient was escorted to have an upright chest radiograph. FINDINGS: A total of approximately 1.4 Liters of yellow fluid was removed. Requested samples were sent to the laboratory. IMPRESSION: Successful ultrasound-guided right sided thoracentesis yielding 1.4 liters of pleural fluid. Read By:  Tsosie Billing PA-C Electronically Signed   By: Lucrezia Europe M.D.   On: 11/04/2015 16:31    CBC  Recent Labs Lab 11/27/15 1211 12/02/15 1401 12/03/15 0307  WBC 7.1 5.9 4.5  HGB 13.0 12.3* 10.8*  HCT 36.6* 37.4* 32.4*  PLT 186 149* 129*  MCV 96.3 95.9 97.6  MCH 34.2* 31.5 32.5  MCHC 35.5 32.9 33.3  RDW 14.3 14.5 14.6    Chemistries   Recent Labs Lab 11/27/15 1211 12/02/15 1401 12/03/15 0307  NA 134* 140 140  K 4.6 5.0 4.1  CL 100 107 107  CO2 25 25 25   GLUCOSE 54* 138* 135*  BUN 21 19 16   CREATININE 1.24* 1.30* 1.07  CALCIUM 8.4* 8.7* 8.4*  AST 32  --   --   ALT 22  --   --   ALKPHOS 130*  --   --   BILITOT 0.5  --   --    ------------------------------------------------------------------------------------------------------------------ estimated creatinine clearance is 54.2 mL/min (by C-G formula based on Cr of 1.07). ------------------------------------------------------------------------------------------------------------------ No results for input(s): HGBA1C in the last 72 hours. ------------------------------------------------------------------------------------------------------------------ No results for input(s): CHOL, HDL, LDLCALC, TRIG, CHOLHDL, LDLDIRECT in the last 72 hours. ------------------------------------------------------------------------------------------------------------------  Recent Labs  12/03/15 1004  TSH 8.142*    ------------------------------------------------------------------------------------------------------------------ No results for input(s): VITAMINB12, FOLATE, FERRITIN, TIBC, IRON, RETICCTPCT in the last 72 hours.  Coagulation profile No results for input(s): INR, PROTIME in the last 168 hours.  No results for input(s): DDIMER in the last 72 hours.  Cardiac Enzymes No results for input(s): CKMB, TROPONINI, MYOGLOBIN in the last 168 hours.  Invalid input(s): CK ------------------------------------------------------------------------------------------------------------------ Invalid input(s): POCBNP   Recent Labs  12/02/15 2220 12/03/15 0612 12/03/15 1026  GLUCAP 194* 51* 49     Kindrick Lankford M.D. [facility] 12/03/2015, 12:38 PM  Pager: [phone] Between 7am to 7pm - call Pager - [phone]  After 7pm go to [email or web address] - password [email or web address]  Call night coverage person covering after 7pm

## 2015-12-03 NOTE — Progress Notes (Signed)
Heart Failure Navigator Consult Note  Presentation: [name] is a 82 y.o. male with past medical history of PAF (Failed Tikosyn, on Eliquis for anticoagulation), Type 2 DM, HTN, and Hypothyroidism who presented to [facility] ED on 11/04/2015 for a cough and shortness of breath.  He was recently admitted to the hospital before Christmas with worsening dyspnea. Was found to have a large pleural effusion and severe LV dysfunction with EF 25-30%.   Past Medical History  Diagnosis Date  . Acid reflux disease     history of  . H/O: GI bleed   . Persistent atrial fibrillation ([place])     a. Failed tikosyn. b. DCCV did not hold 07/2015.  Marland Kitchen Benign prostatic hypertrophy     history of  . Hepatic damage march 2009    retained hepatic stone , intrahepatic duct catheter  . CAD (coronary artery disease)     a. R/LHC 11/09/15: mild nonobstructive CAD (NICM) with well- compensated hemodynamics with low filling pressures.  . Hypertension   . 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 Seizures ([place])   . Ventricular tachycardia ? Tikosyn proarrhtyhmia   . Cataracts, bilateral   . Diabetes mellitus     insulin dep  . Pleural effusion 10/2015  . Acute on chronic systolic CHF (congestive heart failure) ([place])   . CKD (chronic kidney disease), stage II     Social History   Social History  . Marital Status: Married    Spouse Name: N/A  . Number of Children: 3  . Years of Education: N/A   Occupational History  . 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     Social History Main Topics  . Smoking status: Former Smoker    Types: Pipe    Quit date: 03/14/1991  . Smokeless tobacco: Never Used  . Alcohol Use: No  . Drug Use: No  . Sexual Activity: Not Currently   Other Topics Concern  . None   Social History Narrative   Leisure centre manager. married 1954. 2 sons - [place]; 1 daughter 71. 7 grandchildren. retired- Training and development officer.  End of Life Care: DNR, no prolonged intubation (more than 2-3 days), no sustaining tube feeding, no futile or heroic measures. (Provided signed Out of Facility order and unsigned MOST form 6/112/12)    ECHO:-11/05/15 Study Conclusions  - Left ventricle: The cavity size was normal. Systolic function was severely reduced. The estimated ejection fraction was in the range of 25% to 30%. Diffuse hypokinesis. - Aortic valve: Trileaflet; mildly thickened, mildly calcified leaflets. Cusp separation was reduced. - Aorta: The aorta was moderately calcified. - Mitral valve: Moderately calcified annulus. There was mild regurgitation. - Left atrium: The atrium was moderately dilated. - Right ventricle: The cavity size was moderately dilated. Wall thickness was normal. Systolic function was moderately reduced. - Right atrium: The atrium was severely dilated. - Tricuspid valve: There was moderate regurgitation. - Pulmonic valve: There was moderate regurgitation.  Impressions:  - EF is reduced when compared to prior study (50%).  Transthoracic echocardiography. M-mode, complete 2D, spectral Doppler, and color Doppler. Birthdate: Patient birthdate: 21-Jul-1925. Age: Patient is 82 yr old. Sex: Gender: male. BMI: 22.5 kg/m^2. Blood pressure:   110/68 Patient status: Inpatient. Study date: Study date: 11/05/2015. Study time: 12:11  PM. Location: Bedside. BNP    Component Value Date/Time   BNP 469.1* 11/04/2015 1312    ProBNP    Component Value Date/Time   PROBNP 504.0* 11/04/2015 1100     Education Assessment and Provision:  Detailed education and instructions provided on heart failure disease management including the following:  Signs and symptoms of Heart Failure When to call the physician Importance of daily weights Low sodium diet Fluid  restriction Medication management Anticipated future follow-up appointments  Patient education given on each of the above topics.  Patient acknowledges understanding and acceptance of all instructions.  I spoke briefly with [name] and his daughter regarding his HF.  He was recently in the hospital with a pleural effusion and newly reduced EF.  He was given education at that time and can teach back topics listed above.  He is weighing each day at home and notes a 10 pound weight gain since his discharge from the hospital on 11/12/15.  I reviewed a low sodium diet and high sodium foods to avoid.  He denies any issues getting or taking prescribed medications.  He and his wife live at [place].  He follows with Dr Caryl Comes at [facility].  Education Materials:  "Living Better With Heart Failure" Booklet, Daily Weight Tracker Tool    High Risk Criteria for Readmission and/or Poor Patient Outcomes:   EF <30%- Yes 25-30%  2 or more admissions in 6 months- Yes  Difficult social situation- No-lives at [place] with his wife  Demonstrates medication noncompliance- No- denies   Barriers of Care: Knowledge and compliance   Discharge Planning:   Plans to return home with wife.  He would benefit from [facility] for ongoing education and compliance reinforcement.

## 2015-12-04 ENCOUNTER — Telehealth: Payer: Self-pay | Admitting: Internal Medicine

## 2015-12-04 ENCOUNTER — Inpatient Hospital Stay (HOSPITAL_COMMUNITY): Payer: Medicare Other

## 2015-12-04 ENCOUNTER — Inpatient Hospital Stay (HOSPITAL_COMMUNITY): Payer: Medicare Other | Admitting: Certified Registered Nurse Anesthetist

## 2015-12-04 ENCOUNTER — Encounter (HOSPITAL_COMMUNITY): Payer: Self-pay | Admitting: Certified Registered"

## 2015-12-04 ENCOUNTER — Encounter (HOSPITAL_COMMUNITY)
Admission: EM | Disposition: A | Discharge status: Home Health | Payer: Self-pay | Source: Home / Self Care | Attending: Internal Medicine

## 2015-12-04 DIAGNOSIS — J90 Pleural effusion, not elsewhere classified: Secondary | ICD-10-CM

## 2015-12-04 HISTORY — PX: CHEST TUBE INSERTION: SHX231

## 2015-12-04 LAB — CBC
HEMATOCRIT: 36 % — AB (ref 39.0–52.0)
Hemoglobin: 11.7 g/dL — ABNORMAL LOW (ref 13.0–17.0)
MCH: 31.6 pg (ref 26.0–34.0)
MCHC: 32.5 g/dL (ref 30.0–36.0)
MCV: 97.3 fL (ref 78.0–100.0)
PLATELETS: 133 10*3/uL — AB (ref 150–400)
RBC: 3.7 MIL/uL — ABNORMAL LOW (ref 4.22–5.81)
RDW: 14.4 % (ref 11.5–15.5)
WBC: 5.4 10*3/uL (ref 4.0–10.5)

## 2015-12-04 LAB — GLUCOSE, CAPILLARY
GLUCOSE-CAPILLARY: 97 mg/dL (ref 65–99)
GLUCOSE-CAPILLARY: 111 mg/dL — AB (ref 65–99)
GLUCOSE-CAPILLARY: 148 mg/dL — AB (ref 65–99)
Glucose-Capillary: 107 mg/dL — ABNORMAL HIGH (ref 65–99)
Glucose-Capillary: 109 mg/dL — ABNORMAL HIGH (ref 65–99)

## 2015-12-04 LAB — BASIC METABOLIC PANEL
Anion gap: 8 (ref 5–15)
BUN: 15 mg/dL (ref 6–20)
CALCIUM: 8.3 mg/dL — AB (ref 8.9–10.3)
CO2: 23 mmol/L (ref 22–32)
CREATININE: 1.17 mg/dL (ref 0.61–1.24)
Chloride: 106 mmol/L (ref 101–111)
GFR calc Af Amer: >60 mL/min (ref >60)
GFR, EST NON AFRICAN AMERICAN: 54 mL/min — AB (ref >60)
GLUCOSE: 107 mg/dL — AB (ref 65–99)
Potassium: 4.2 mmol/L (ref 3.5–5.1)
Sodium: 137 mmol/L (ref 135–145)

## 2015-12-04 SURGERY — INSERTION, PLEURAL DRAINAGE CATHETER
Anesthesia: Monitor Anesthesia Care | Site: Chest | Laterality: Right

## 2015-12-04 MED ORDER — TRAMADOL HCL 50 MG PO TABS: 50.0000 mg | ORAL_TABLET | Freq: Four times a day (QID) | ORAL | Status: DC | PRN

## 2015-12-04 MED ORDER — PROPOFOL 500 MG/50ML IV EMUL
INTRAVENOUS | Status: DC | PRN
  Administered 2015-12-04: 50 ug/kg/min via INTRAVENOUS

## 2015-12-04 MED ORDER — LACTATED RINGERS IV SOLN: INTRAVENOUS | Status: DC

## 2015-12-04 MED ORDER — LIDOCAINE HCL (CARDIAC) 20 MG/ML IV SOLN
INTRAVENOUS | Status: AC
  Filled 2015-12-04: qty 5

## 2015-12-04 MED ORDER — LIDOCAINE HCL (PF) 1 % IJ SOLN
INTRAMUSCULAR | Status: DC | PRN
  Administered 2015-12-04: 7 mL

## 2015-12-04 MED ORDER — PROPOFOL 10 MG/ML IV BOLUS
INTRAVENOUS | Status: DC | PRN
  Administered 2015-12-04: 10 mg via INTRAVENOUS
  Administered 2015-12-04: 20 mg via INTRAVENOUS

## 2015-12-04 MED ORDER — FENTANYL CITRATE (PF) 100 MCG/2ML IJ SOLN: 25.0000 ug | INTRAMUSCULAR | Status: DC | PRN

## 2015-12-04 MED ORDER — SUCCINYLCHOLINE CHLORIDE 20 MG/ML IJ SOLN
INTRAMUSCULAR | Status: AC
  Filled 2015-12-04: qty 1

## 2015-12-04 MED ORDER — LIDOCAINE HCL (PF) 1 % IJ SOLN
INTRAMUSCULAR | Status: AC
  Filled 2015-12-04: qty 5

## 2015-12-04 MED ORDER — FENTANYL CITRATE (PF) 250 MCG/5ML IJ SOLN
INTRAMUSCULAR | Status: AC
  Filled 2015-12-04: qty 5

## 2015-12-04 MED ORDER — LACTATED RINGERS IV SOLN
INTRAVENOUS | Status: DC
  Administered 2015-12-04: 14:00:00 via INTRAVENOUS

## 2015-12-04 MED ORDER — PROMETHAZINE HCL 25 MG/ML IJ SOLN: 6.2500 mg | INTRAMUSCULAR | Status: DC | PRN

## 2015-12-04 MED ORDER — ONDANSETRON HCL 4 MG/2ML IJ SOLN
INTRAMUSCULAR | Status: AC
  Filled 2015-12-04: qty 2

## 2015-12-04 MED ORDER — FENTANYL CITRATE (PF) 100 MCG/2ML IJ SOLN
INTRAMUSCULAR | Status: DC | PRN
  Administered 2015-12-04 (×2): 25 ug via INTRAVENOUS

## 2015-12-04 MED ORDER — LIDOCAINE HCL (PF) 1 % IJ SOLN
INTRAMUSCULAR | Status: AC
Start: 2015-12-04 — End: 2015-12-04
  Filled 2015-12-04: qty 5

## 2015-12-04 MED ORDER — HYDROCODONE-ACETAMINOPHEN 7.5-325 MG PO TABS: 1.0000 | ORAL_TABLET | Freq: Once | ORAL | Status: DC | PRN

## 2015-12-04 MED ORDER — LIDOCAINE HCL (CARDIAC) 20 MG/ML IV SOLN
INTRAVENOUS | Status: DC | PRN
  Administered 2015-12-04: 30 mg via INTRATRACHEAL

## 2015-12-04 SURGICAL SUPPLY — 26 items
BRUSH SCRUB EZ PLAIN DRY (MISCELLANEOUS) ×2 IMPLANT
CANISTER SUCTION 2500CC (MISCELLANEOUS) ×2 IMPLANT
COVER SURGICAL LIGHT HANDLE (MISCELLANEOUS) ×2 IMPLANT
DERMABOND ADVANCED (GAUZE/BANDAGES/DRESSINGS) ×1
DERMABOND ADVANCED .7 DNX12 (GAUZE/BANDAGES/DRESSINGS) ×1 IMPLANT
DRAPE C-ARM 42X72 X-RAY (DRAPES) ×2 IMPLANT
DRAPE LAPAROSCOPIC ABDOMINAL (DRAPES) ×2 IMPLANT
GLOVE EUDERMIC 7 POWDERFREE (GLOVE) ×2 IMPLANT
GOWN STRL REUS W/ TWL LRG LVL3 (GOWN DISPOSABLE) ×1 IMPLANT
GOWN STRL REUS W/ TWL XL LVL3 (GOWN DISPOSABLE) ×1 IMPLANT
GOWN STRL REUS W/TWL LRG LVL3 (GOWN DISPOSABLE) ×1
GOWN STRL REUS W/TWL XL LVL3 (GOWN DISPOSABLE) ×1
KIT BASIN OR (CUSTOM PROCEDURE TRAY) ×2 IMPLANT
KIT PLEURX DRAIN CATH 1000ML (MISCELLANEOUS) ×2 IMPLANT
KIT PLEURX DRAIN CATH 15.5FR (DRAIN) ×2 IMPLANT
KIT ROOM TURNOVER OR (KITS) ×2 IMPLANT
NS IRRIG 1000ML POUR BTL (IV SOLUTION) ×2 IMPLANT
PACK GENERAL/GYN (CUSTOM PROCEDURE TRAY) ×2 IMPLANT
PAD ARMBOARD 7.5X6 YLW CONV (MISCELLANEOUS) ×4 IMPLANT
SET DRAINAGE LINE (MISCELLANEOUS) IMPLANT
SUT ETHILON 3 0 PS 1 (SUTURE) ×2 IMPLANT
SUT VIC AB 3-0 X1 27 (SUTURE) ×2 IMPLANT
TOWEL OR 17X24 6PK STRL BLUE (TOWEL DISPOSABLE) ×2 IMPLANT
TOWEL OR 17X26 10 PK STRL BLUE (TOWEL DISPOSABLE) ×2 IMPLANT
VALVE REPLACEMENT CAP (MISCELLANEOUS) IMPLANT
WATER STERILE IRR 1000ML POUR (IV SOLUTION) ×2 IMPLANT

## 2015-12-04 NOTE — Anesthesia Preprocedure Evaluation (Addendum)
Anesthesia Evaluation  Patient identified by MRN, date of birth, ID band Patient awake    Reviewed: Allergy & Precautions, NPO status , Patient's Chart, lab work & pertinent test results  History of Anesthesia Complications Negative for: history of anesthetic complications  Airway Mallampati: II  TM Distance: >3 FB Neck ROM: Full    Dental  (+) Poor Dentition, Chipped, Teeth Intact,    Pulmonary former smoker,    breath sounds clear to auscultation       Cardiovascular hypertension, Pt. on home beta blockers + CAD, + Peripheral Vascular Disease, +CHF and + DOE  + dysrhythmias Atrial Fibrillation  Rhythm:Irregular Rate:Tachycardia  EF 25-30%. Mild Non-obstructive CAD. NICM   Neuro/Psych Seizures -,     GI/Hepatic Neg liver ROS, GERD  ,S/p whipple   Endo/Other  diabetes, Type 2, Insulin DependentHypothyroidism   Renal/GU Renal disease     Musculoskeletal   Abdominal   Peds  Hematology  (+) anemia ,   Anesthesia Other Findings   Reproductive/Obstetrics                           Lab Results  Component Value Date   WBC 5.4 12/04/2015   HGB 11.7* 12/04/2015   HCT 36.0* 12/04/2015   MCV 97.3 12/04/2015   PLT 133* 12/04/2015   Lab Results  Component Value Date   CREATININE 1.17 12/04/2015   BUN 15 12/04/2015   NA 137 12/04/2015   K 4.2 12/04/2015   CL 106 12/04/2015   CO2 23 12/04/2015    Anesthesia Physical  Anesthesia Plan  ASA: IV  Anesthesia Plan: MAC   Post-op Pain Management:    Induction: Intravenous  Airway Management Planned: Simple Face Mask  Additional Equipment:   Intra-op Plan:   Post-operative Plan: Extubation in OR  Informed Consent: I have reviewed the patients History and Physical, chart, labs and discussed the procedure including the risks, benefits and alternatives for the proposed anesthesia with the patient or authorized representative who has  indicated his/her understanding and acceptance.     Plan Discussed with: CRNA  Anesthesia Plan Comments:         Anesthesia Quick Evaluation

## 2015-12-04 NOTE — Anesthesia Procedure Notes (Signed)
Procedure Name: MAC Date/Time: 12/04/2015 2:15 PM Performed by: Barrington Ellison Pre-anesthesia Checklist: Patient identified, Emergency Drugs available, Suction available, Patient being monitored and Timeout performed Patient Re-evaluated:Patient Re-evaluated prior to inductionOxygen Delivery Method: Simple face mask

## 2015-12-04 NOTE — Op Note (Signed)
CARDIOTHORACIC SURGERY OPERATIVE NOTE   12/04/2015 MONEY ETLING [id number]  Surgeon: Gaye Pollack, MD   First Assistant: none   Preoperative Diagnosis: Recurrent right pleural effusion  Postoperative Diagnosis: same   Procedure:   1.Insertion of Right PleurX catheter   Anesthesia: MAC with local   Clinical History/Surgical Indication:   He has a history of PAF on Eliquis, Type 2 DM, HTN, hypothyroidism and chronic systolic heart failure with an EF of 25-30% by recent echo on 11/05/2015. There was mild MR, moderate PI and moderate TR. The RV was moderately dilated with moderate systolic dysfunction. He was admitted in December with shortness of breath and a CT of the chest that showed a moderate right pleural effusion and possible right middle and lower lobe pneumonia. He had a right thoracentesis of 1.4 L on 12/7 and then 1.6 L on 12/9. Cytology was negative for malignancy and it appeared to be a transudate felt to be related to heart failure. He was treated empirically with Levaquin although he had no fever or leukocytosis. He has been back at [facility] where he lives with his wife and has now developed progressive shortness of breath. CXR now shows recurrent right pleural effusion of moderate size. He has had no fever. He has non-productive cough, no orthopnea or PND. Mild LE edema.  He has a recurrent right pleural effusion that is likely due to chronic biventricular systolic heart failure. He has had two thoracenteses last month for a total of 3L of effusion. I think the best option at this point is to place a right PleurX catheter to allow outpatient management of the effusion. Hopefully this will allow resolution of the effusion and the catheter can be removed after a few weeks. If the effusion does not resolve then I would consider a talc pleurodesis through the catheter. I discussed the procedure with the patient and his family including alternatives, benefits, and risks including  but not limited to bleeding, infection, catheter malfunction, lung injury, recurrent effusion and he understands and agrees to proceed.  Preparation:  The patient was seen in the preoperative holding area and the correct patient, correct operation, correct operative sidewere confirmed with the patient after reviewing the medical record and CT scan. The right side of the chest was signed by me. The consent was signed by me. Preoperative antibiotics were given. The patient was taken back to the operating room and positioned supine on the operating room table. After intravenous sedation by anesthesia the chest was prepped with betadine soap and solution and draped in the usual sterile manner. A surgical time-out was taken and the correct patient,operative side, and operative procedure were confirmed with the nursing and anesthesia staff.   Operative Procedure:   The chest wall entry site was located on the right lateral chest approximately in the 8th ICS. 1% lidocaine was infiltrated in the skin and subcutaneous tissue down to the pleural space. Serous fluid was encountered. A small incision was made and the right pleural space was entered with a needle catheter. The needle was removed and the guidewire inserted into the pleural space. Its position was checked with floroscopy. The skin exit site was chosen on the anterior chest wall just above the costal margin and lidocaine was infiltrated here and along a subcutaneous tunnel over to the chest wall entry site. A small incision was made at the skin exit site and the Pleurx catheter was tunneled from the exit site over to the chest wall entry site and  positioned with the cuff just inside the exit site. An introducer and sheath were inserted into the pleural space over the guide wire and the introducer and wire removed. The catheter was inserted into the pleural space and the sheath removed. The catheter was connected to vacuum bottle suction and 2 liters of  serous pleural fluid was removed. A sample was not sent for any testing since this had already been done.  The catheter was fixed to the skin at the exit site with a nylon suture and the other incision was closed with a 3-0 vicryl subcuticular suture and Dermabond. The catheter was capped and a dressing applied over the exit site. The patient tolerated the procedure well with mild coughing. The sponge, needle and instrument counts were correct according to the nurses and the patient was transported to the PACU in stable and satisfactory condition.

## 2015-12-04 NOTE — Progress Notes (Signed)
[facility]                                                                              Patient Demographics  [name], is a 82 y.o. male, DOB - [birth year], [id number]  Admit date - 12/02/2015   Admitting Physician Rise Patience, MD  Outpatient Primary MD for the patient is [name], MD  LOS - 2   Chief Complaint  Patient presents with  . Shortness of Breath       Brief HPI   [name] is a 82 y.o. male with history of chronic systolic chf heart failure, atrial fibrillation, hypothyroidism, diabetes mellitus, chronic anemia who was admitted in December 2016 for pleural effusion and has had thoracentesis twice presented to the ER because of dyspnea and chest x-ray showing recurrence of pleural effusion. Patient reported that he has been getting short of breath progressively after discharge last month and his PCP had ordered chest x-ray which showed recurrence of pleural effusion and was advised to come to the ER. Chest x-ray showed worsening of pleural effusion and patient was admitted for thoracentesis. Patient denied any chest pain. Has some nonproductive cough. Denied any fever or chills. Patient does have mild edema of the lower extremities.   Assessment & Plan     Recurrent right-sided pleural effusion - cause possibly due to chronic systolic CHF, entirely not clear.  - Patient had last thoracentesis on 12/9, 12/7, cytology showed no malignancy. CT chest had shown no malignancy.  - continue to hold Apixaban.  last dose on 12/02/15 a.m.  - Due to frequent recurrent pleural effusion, cardiology and cardiothoracic surgery consulted.  - Plan for Pleurx catheter placement today - Chest x-ray shows possible opacity but no signs of infection.    Chronic atrial fibrillation - rate controlled.  - Patient's Apixaban is on hold for Pleurx catheter placement today . Patient is agreeable.  - Continue amiodarone metoprolol and digoxin.   Chronic  systolic heart failure last EF measured was 25% - - continue Lasix.   - cardiology following   Diabetes mellitus type 2 - -  continue home medications with sliding scale coverage.  Chronic anemia - follow CBC.  Hypothyroidism  - Continue  Synthroid.  History of malignant neoplasm of the gallbladder.  Code Status: full CODE STATUS  Family Communication: Discussed in detail with the patient, all imaging results, lab results explained to the patient    Disposition Plan: DC likely in am or when cleared by [facility]   Time Spent in minutes 15 minutes  Procedures  None   Consults   Cardiology [facility]  DVT Prophylaxis   SCD's, apixaban on hold   Medications  Scheduled Meds: . amiodarone  200 mg Oral BID  . azelastine  2 spray Each Nare BID  . digoxin  0.125 mg Oral QODAY  . finasteride  5 mg Oral Daily  . furosemide  20 mg Oral QODAY  . insulin aspart  0-9 Units Subcutaneous TID WC  . insulin aspart  4-8 Units Subcutaneous TID WC  . levofloxacin (LEVAQUIN) IV  750 mg Intravenous To SS-Surg  . levothyroxine  25 mcg Oral QAC breakfast  . lipase/protease/amylase  12,000 Units Oral TID AC  . metoprolol tartrate  25 mg Oral BID  . tamsulosin  0.4 mg Oral BID  . vitamin C  500 mg Oral Daily   Continuous Infusions:     Antibiotics   Anti-infectives    Start     Dose/Rate Route Frequency Ordered Stop   12/04/15 1314  levofloxacin (LEVAQUIN) IVPB 750 mg     750 mg 100 mL/hr over 90 Minutes Intravenous To ShortStay Surgical 12/03/15 1658 12/05/15 1315        Subjective:   [name] was seen and examined today.  Currently stable, no significant dyspnea, denies any chest pain.  Patient denies dizziness, abdominal pain, N/V/D/C, new weakness, numbess, tingling. No acute events overnight.    Objective:   Blood pressure 107/52, pulse 83, temperature 97.6 F (36.4 C), temperature source Oral, resp. rate 19, height 6\' 3"  (1.905 m), weight 79.697 kg (175 lb 11.2 oz), SpO2 92  %.  Wt Readings from Last 3 Encounters:  12/04/15 79.697 kg (175 lb 11.2 oz)  12/01/15 83.462 kg (184 lb)  11/27/15 79.379 kg (175 lb)     Intake/Output Summary (Last 24 hours) at 12/04/15 1142 Last data filed at 12/04/15 0757  Gross per 24 hour  Intake    240 ml  Output    600 ml  Net   -360 ml    Exam  General: Alert and oriented x 3, NAD  HEENT:  PERRLA, EOMI, Anicteric Sclera, mucous membranes moist.   Neck: Supple, no JVD, no masses  CVS: S1 S2 auscultated, no rubs, murmurs or gallops. Regular rate and rhythm.  Respiratory: decreased breath sound, Rt >LT  Abdomen: Soft, nontender, nondistended, + bowel sounds  Ext: no cyanosis clubbing or edema  Neuro: no new deficits  Skin: No rashes  Psych: Normal affect and demeanor, alert and oriented x3    Data Review   Micro Results No results found for this or any previous visit (from the past 240 hour(s)).  Radiology Reports Dg Chest 1 View  11/06/2015  CLINICAL DATA:  Post right thoracentesis EXAM: CHEST 1 VIEW COMPARISON:  11/05/2015 FINDINGS: Cardiomediastinal silhouette is stable. The right pleural effusion has resolved. Mild bilateral basilar atelectasis left greater than right. Right hilar catheter is stable in position. No segmental infiltrate or pulmonary edema. No pneumothorax. IMPRESSION: The right pleural effusion has resolved. Mild bilateral basilar atelectasis left greater than right. Right hilar catheter is stable in position. No segmental infiltrate or pulmonary edema. No pneumothorax. Electronically Signed   By: Lahoma Crocker M.D.   On: 11/06/2015 17:13   Dg Chest 1 View  11/04/2015  CLINICAL DATA:  Initial encounter for status post right thoracentesis. EXAM: CHEST 1 VIEW COMPARISON:  11/04/2015, earlier the same day. FINDINGS: Interval decrease in right pleural effusion no evidence for pneumothorax. Stable appearance of catheter tubing over the medial right hemi thorax and heart silhouette. Left lung  remains clear. The cardio pericardial silhouette is enlarged. Telemetry leads overlie the chest. IMPRESSION: No evidence for pneumothorax status post thoracentesis. Electronically Signed   By: Misty Stanley M.D.   On: 11/04/2015 16:56   Dg Chest 2 View  12/01/2015  CLINICAL DATA:  Follow up right pleural effusion EXAM: CHEST  2 VIEW COMPARISON:  November 09, 2015 FINDINGS: No pneumothorax. The catheter in the right pulmonary artery is stable. There is a right-sided pleural effusion with underlying opacity, increased in the interval. The effusion  is now moderate in size. No other interval changes or acute abnormalities identified. IMPRESSION: Increasing right-sided pleural effusion with underlying opacity. Electronically Signed   By: Dorise Bullion III M.D   On: 12/01/2015 15:18   Dg Chest 2 View  11/05/2015  CLINICAL DATA:  History of recent thoracentesis forl reduction of right pleural effusion, cough and congestion EXAM: CHEST  2 VIEW COMPARISON:  Portable chest x-ray of 11/04/2015 FINDINGS: After right thoracentesis, there is little change in the small right pleural effusion remaining with right basilar atelectasis. The left lung is clear. Cardiomegaly is stable. No pneumothorax is seen. IMPRESSION: No significant change after right thoracentesis in the volume of the right pleural effusion with right basilar atelectasis. Electronically Signed   By: Ivar Drape M.D.   On: 11/05/2015 08:00   Ct Angio Chest Pe W/cm &/or Wo Cm  11/05/2015  CLINICAL DATA:  Shortness of breath, pleural effusion. 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 Multiplanar CT image reconstructions and MIPs were obtained to evaluate the vascular anatomy. CONTRAST:  40mL OMNIPAQUE IOHEXOL 350 MG/ML SOLN COMPARISON:  CT abdomen 12/16/ 15 images of  the lung bases FINDINGS: Sagittal images of the spine shows osteopenia and degenerative changes thoracic spine. Sagittal view of the sternum is unremarkable. Atherosclerotic calcifications of thoracic aorta and coronary arteries. The study is of excellent technical quality. No pulmonary embolus is noted. Bilateral perihilar peribronchial thickening is noted. There is no mediastinal hematoma or adenopathy. Images of the upper abdomen shows partially visualized postsurgical changes post Whipple procedure. There is moderate size right pleural effusion. Small left pleural effusion. There is atelectasis in left lower lobe posteriorly. There is patchy airspace disease/infiltrate in right middle lobe and right lower lobe. Findings are highly suspicious for pneumonia. There is a catheter fragment in main pulmonary artery extending from just above the origin in right main pulmonary artery and a small branch. The catheter measures at least 12.5 cm in length. This was only partially visualized on prior exam stable in position from prior exam. Review of the MIP images confirms the above findings. IMPRESSION: 1. No pulmonary embolus. 2. There is moderate size right pleural effusion. Small left pleural effusion. Atelectasis noted in left lower lobe posteriorly. There is consolidation with air bronchogram in right lower lobe and right middle lobe highly suspicious for infiltrate/pneumonia. 3. Mild perihilar bronchitic changes. 4. No mediastinal hematoma or adenopathy. 5. There is a catheter fragment in main pulmonary artery extending from just above the origin in right main pulmonary artery and a small branch. The catheter measures at least 12.5 cm in length. This was only partially visualized on prior exam stable in position from prior exam. These results were called by telephone at the time of interpretation on 11/05/2015 at 1:57 pm to Dr. Reyne Dumas , who verbally acknowledged these results. Electronically Signed   By: Lahoma Crocker M.D.   On: 11/05/2015 14:02   Dg Chest Port 1 View  12/02/2015  CLINICAL DATA:  Progressive shortness of breath. History of right pleural effusion. EXAM: PORTABLE CHEST 1 VIEW COMPARISON:  Frontal and lateral views yesterday. Chest CT 11/05/2015 FINDINGS: Catheter in the right hemithorax, corresponding to right pulmonary artery on CT unchanged in position. Progressive right pleural effusion and hazy opacity throughout the right hemithorax. Stable blunting of left costophrenic angle. Cardiomediastinal contours are unchanged. No pneumothorax.  The cortex of the right proximal humerus appears irregular, only partially included in the field of view. IMPRESSION: 1. Increasing right pleural effusion, moderate to large in size. 2. Question of irregular cortex right proximal humerus, only partially included. Dedicated humerus radiographs recommended, as well as correlation for any pain in this region. Electronically Signed   By: Jeb Levering M.D.   On: 12/02/2015 22:01   Dg Chest Port 1 View  11/09/2015  CLINICAL DATA:  Short of breath, chest pain EXAM: PORTABLE CHEST 1 VIEW COMPARISON:  Radiograph 11/06/2015, 10/30/2012 FINDINGS: There is a catheter in the RIGHT main pulmonary artery unchanged from 10/30/2012. Normal cardiac silhouette. There is increased airspace density in the RIGHT lower lobe. Small RIGHT effusion. No pneumothorax. IMPRESSION: Increasing RIGHT lower lobe airspace disease and small effusions concerning for pneumonia Electronically Signed   By: Suzy Bouchard M.D.   On: 11/09/2015 14:45   Dg Swallowing Func-speech Pathology  11/06/2015  Objective Swallowing Evaluation:   Patient Details Name: YANDIEL STINER MRN: [id number] Date of Birth: 28-Feb-1925 Today's Date: 11/06/2015 Time: SLP Start Time (ACUTE ONLY): 1009-SLP Stop Time (ACUTE ONLY): 1020 SLP Time Calculation (min) (ACUTE ONLY): 11 min Past Medical History: Past Medical History Diagnosis Date . Acid reflux disease    history of . H/O:  GI bleed  . Paroxysmal atrial fibrillation (HCC)    chronic anticoag; tikosyn . Benign prostatic hypertrophy    history of . Hepatic damage march 2009   retained hepatic stone , intrahepatic duct catheter . CAD (coronary artery disease)  . Hypertension  . 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 Seizures ([place])  . Ventricular tachycardia ? Tikosyn proarrhtyhmia  . Cataracts, bilateral  . Diabetes mellitus    insulin dep . Pleural effusion 10/2015 . Shortness of breath dyspnea  Past Surgical History: Past Surgical History Procedure Laterality Date . Cardioversion   . Cholecystectomy     history of . Partial gastrectomy     history of . Shoulder surgery     left shoulder repair with 2 pens inserted . Whipple procedure     operation . 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 Inguinal hernia repair     inguinal herniorrhaphy, right... history of . Appendectomy     history of . Cardioversion  03/07/2012   Procedure: CARDIOVERSION;  Surgeon: Deboraha Sprang, MD;  Location: [facility];  Service: Cardiovascular;  Laterality: N/A; . Cardioversion N/A 08/10/2015   Procedure: CARDIOVERSION;  Surgeon: Thayer Headings, MD;  Location: [facility] ENDOSCOPY;  Service: Cardiovascular;  Laterality: N/A; HPI: Azazel Ezernack is a 82 y.o. male, with permanent atrial fibrillation on Eliquis, diabetes mellitus, seizures, and history of Whipple procedure who presents to the emergency department with shortness of breath. Mr. Cayson saw his primary care physician for cough and shortness of breath and was sent to the emergency department after an x-ray showed a large right pleural effusion. Mr. Stuebe reports that he has been feeling fatigued and badly for about the past month. But over the last week he has become much worse. He is no longer able to walk his daily 1/2 mile. He is unable to sleep. He has a dry cough but  occasionally brings up frothy sputum. He also coughs when he eats or drinks liquids,  No Data Recorded Assessment / Plan / Recommendation CHL IP CLINICAL  IMPRESSIONS 11/06/2015 Therapy Diagnosis Moderate pharyngeal phase dysphagia Clinical Impression Pt presents with moderate pharyngeal phase dysphagia characterized by one incidence of flash penetration with larger boluses of liquids, but clearance noted without strategies utilized; pt also exhibited moderate vallecular residue with puree-solid consistencies which required multiple repetitive swallows, effortful swallows and alternating solids/liquids to clear valleculae which place him at a moderate aspiration risk without use of strategies during po intake; decreased epiglottic inversion noted intermittently during study as well which also increases his aspiration risk.  Pt did not aspirate any consistency, but is certainly at risk if strategies are not utilized during po intake; ST to f/u during hospitalization for diet tolerance/pt education re: aspiration risk/swallowing strategy use and should be f/u with SLP at SNF. Impact on safety and function Moderate aspiration risk   CHL IP TREATMENT RECOMMENDATION 11/06/2015 Treatment Recommendations Therapy as outlined in treatment plan below   Prognosis 11/06/2015 Prognosis for Safe Diet Advancement Good Barriers to Reach Goals -- Barriers/Prognosis Comment -- CHL IP DIET RECOMMENDATION 11/06/2015 SLP Diet Recommendations Regular solids;Thin liquid Liquid Administration via Cup Medication Administration Whole meds with liquid Compensations Slow rate;Small sips/bites;Clear throat intermittently;Effortful swallow Postural Changes Seated upright at 90 degrees   CHL IP OTHER RECOMMENDATIONS 11/06/2015 Recommended Consults -- Oral Care Recommendations Oral care BID Other Recommendations --   CHL IP FOLLOW UP RECOMMENDATIONS 11/05/2015 Follow up Recommendations ST f/u at SNF upon d/c   CHL IP FREQUENCY AND DURATION 11/06/2015  Speech Therapy Frequency (ACUTE ONLY) min 2x/week Treatment Duration 1 week      CHL IP ORAL PHASE 11/06/2015 Oral Phase WFL Oral - Pudding Teaspoon -- Oral - Pudding Cup -- Oral - Honey Teaspoon -- Oral - Honey Cup -- Oral - Nectar Teaspoon -- Oral - Nectar Cup -- Oral - Nectar Straw -- Oral - Thin Teaspoon -- Oral - Thin Cup -- Oral - Thin Straw -- Oral - Puree -- Oral - Mech Soft -- Oral - Regular -- Oral - Multi-Consistency -- Oral - Pill -- Oral Phase - Comment --  CHL IP PHARYNGEAL PHASE 11/06/2015 Pharyngeal Phase Impaired Pharyngeal- Pudding Teaspoon -- Pharyngeal -- Pharyngeal- Pudding Cup -- Pharyngeal -- Pharyngeal- Honey Teaspoon -- Pharyngeal -- Pharyngeal- Honey Cup -- Pharyngeal -- Pharyngeal- Nectar Teaspoon -- Pharyngeal -- Pharyngeal- Nectar Cup -- Pharyngeal -- Pharyngeal- Nectar Straw -- Pharyngeal -- Pharyngeal- Thin Teaspoon -- Pharyngeal -- Pharyngeal- Thin Cup Penetration/Aspiration during swallow;Other (Comment) Pharyngeal (No Data) Pharyngeal- Thin Straw -- Pharyngeal -- Pharyngeal- Puree Reduced epiglottic inversion;Pharyngeal residue - valleculae;Compensatory strategies attempted (with notebox) Pharyngeal -- Pharyngeal- Mechanical Soft -- Pharyngeal -- Pharyngeal- Regular Reduced epiglottic inversion;Pharyngeal residue - valleculae;Compensatory strategies attempted (with notebox) Pharyngeal -- Pharyngeal- Multi-consistency -- Pharyngeal -- Pharyngeal- Pill -- Pharyngeal -- Pharyngeal Comment --  CHL IP CERVICAL ESOPHAGEAL PHASE 11/06/2015 Cervical Esophageal Phase WFL Pudding Teaspoon -- Pudding Cup -- Honey Teaspoon -- Honey Cup -- Nectar Teaspoon -- Nectar Cup -- Nectar Straw -- Thin Teaspoon -- Thin Cup -- Thin Straw -- Puree -- Mechanical Soft -- Regular -- Multi-consistency -- Pill -- Cervical Esophageal Comment -- ADAMS,PAT, M.S., CCC-SLP 11/06/2015, 10:31 AM              US Thoracentesis Asp Pleural Space W/img Guide  11/06/2015  Ardis Rowan, PA-C     11/06/2015  4:37 PM  Successful US guided right thoracentesis. Yielded 1.6 liters of clear yellow fluid. Pt tolerated procedure well. No immediate complications. Specimen was not sent for labs. CXR ordered. WENDY S BLAIR PA-C  11/06/2015 4:36 PM   US Thoracentesis Asp Pleural Space W/img Guide  11/04/2015  INDICATION: Symptomatic right sided pleural effusion EXAM: US THORACENTESIS ASP PLEURAL SPACE W/IMG GUIDE COMPARISON:  CXR 11/04/2015. MEDICATIONS: None COMPLICATIONS: None immediate TECHNIQUE: Informed written consent was obtained from the patient after a discussion of the risks, benefits and alternatives to treatment. A timeout was performed prior to the initiation of the procedure. Initial ultrasound scanning demonstrates a right pleural effusion. The lower chest was prepped and draped in the usual sterile fashion. 1% lidocaine was used for local anesthesia. An ultrasound image was saved for documentation purposes. A 6 Fr Safe-T-Centesis catheter was introduced. The thoracentesis was performed. The catheter was removed and a dressing was applied. The patient tolerated the procedure well without immediate post procedural complication. The patient was escorted to have an upright chest radiograph. FINDINGS: A total of approximately 1.4 Liters of yellow fluid was removed. Requested samples were sent to the laboratory. IMPRESSION: Successful ultrasound-guided right sided thoracentesis yielding 1.4 liters of pleural fluid. Read By:  Tsosie Billing PA-C Electronically Signed   By: Lucrezia Europe M.D.   On: 11/04/2015 16:31    CBC  Recent Labs Lab 11/27/15 1211 12/02/15 1401 12/03/15 0307 12/04/15 0330  WBC 7.1 5.9 4.5 5.4  HGB 13.0 12.3* 10.8* 11.7*  HCT 36.6* 37.4* 32.4* 36.0*  PLT 186 149* 129* 133*  MCV 96.3 95.9 97.6 97.3  MCH 34.2* 31.5 32.5 31.6  MCHC 35.5 32.9 33.3 32.5  RDW 14.3 14.5 14.6 14.4    Chemistries   Recent Labs Lab 11/27/15 1211 12/02/15 1401 12/03/15 0307 12/04/15 0330  NA 134* 140 140 137  K  4.6 5.0 4.1 4.2  CL 100 107 107 106  CO2 25 25 25 23   GLUCOSE 54* 138* 135* 107*  BUN 21 19 16 15   CREATININE 1.24* 1.30* 1.07 1.17  CALCIUM 8.4* 8.7* 8.4* 8.3*  AST 32  --   --   --   ALT 22  --   --   --   ALKPHOS 130*  --   --   --   BILITOT 0.5  --   --   --    ------------------------------------------------------------------------------------------------------------------ estimated creatinine clearance is 49.2 mL/min (by C-G formula based on Cr of 1.17). ------------------------------------------------------------------------------------------------------------------ No results for input(s): HGBA1C in the last 72 hours. ------------------------------------------------------------------------------------------------------------------ No results for input(s): CHOL, HDL, LDLCALC, TRIG, CHOLHDL, LDLDIRECT in the last 72 hours. ------------------------------------------------------------------------------------------------------------------  Recent Labs  12/03/15 1004  TSH 8.142*   ------------------------------------------------------------------------------------------------------------------ No results for input(s): VITAMINB12, FOLATE, FERRITIN, TIBC, IRON, RETICCTPCT in the last 72 hours.  Coagulation profile No results for input(s): INR, PROTIME in the last 168 hours.  No results for input(s): DDIMER in the last 72 hours.  Cardiac Enzymes No results for input(s): CKMB, TROPONINI, MYOGLOBIN in the last 168 hours.  Invalid input(s): CK ------------------------------------------------------------------------------------------------------------------ Invalid input(s): [place]  12/02/15 2220 12/03/15 0612 12/03/15 1026 12/03/15 1641 12/03/15 2206 12/04/15 0611  GLUCAP 194* 107* 73 118* 28 97     Hasani Diemer M.D. [facility] 12/04/2015, 11:42 AM  Pager: [phone] Between 7am to 7pm - call Pager - [phone]  After 7pm go to [email or web address]  - password [email or web address]  Call night coverage person covering after 7pm

## 2015-12-04 NOTE — Care Management Note (Signed)
Case Management Note  Patient Details  Name: [name] MRN: [id number] Date of Birth: [birth year]  Subjective/Objective:     Admitted with Recurrent Pleural Effusion               Action/Plan: Patient lives at [facility], is active with [facility] for HHRN,PT and OT. Attending MD at discharge please document how ofter the [facility] is to drain the PleurX catheter. Mary with [facility] is aware case / following for DCP.  Expected Discharge Date:  Possibly 12/08/2015            Expected Discharge Plan:  [place]  Discharge planning Services  CM Consult   Choice offered to:  Patient  HH Arranged:  RN, PT, OT [facility] Agency:  [place]  Status of Service:  In process, will continue to follow  Medicare Important Message Given:  Yes  Sherrilyn Rist [phone] 12/04/2015, 2:57 PM

## 2015-12-04 NOTE — Transfer of Care (Signed)
Immediate Anesthesia Transfer of Care Note  Patient: [name]  Procedure(s) Performed: Procedure(s): INSERTION RIGHT PLEURAL DRAINAGE CATHETER (Right)  Patient Location: PACU  Anesthesia Type:MAC  Level of Consciousness: awake, alert  and oriented  Airway & Oxygen Therapy: Patient Spontanous Breathing  Post-op Assessment: Report given to RN  Post vital signs: Reviewed and stable  Last Vitals:  Filed Vitals:   12/04/15 1201 12/04/15 1511  BP: 99/42   Pulse: 78 73  Temp: 36.4 C   Resp: 18     Complications: No apparent anesthesia complications

## 2015-12-04 NOTE — Brief Op Note (Signed)
12/02/2015 - 12/04/2015  4:39 PM  PATIENT:  [name]  82 y.o. male  PRE-OPERATIVE DIAGNOSIS:  RECURRENT RIGHT PLEURAL EFFUSION  POST-OPERATIVE DIAGNOSIS:  RECURRENT RIGHT PLEURAL EFFUSION  PROCEDURE:  Procedure(s): INSERTION RIGHT PLEURAL DRAINAGE CATHETER (Right)  SURGEON:  Surgeon(s) and Role:    * Gaye Pollack, MD - Primary  PHYSICIAN ASSISTANT: none  ASSISTANTS: none   ANESTHESIA:   local and MAC  EBL:  Total I/O In: 400 [I.V.:400] Out: 200 [Urine:200]  BLOOD ADMINISTERED:none  DRAINS: none   LOCAL MEDICATIONS USED:  LIDOCAINE   SPECIMEN:  No Specimen  DISPOSITION OF SPECIMEN:  N/A  COUNTS:  YES  TOURNIQUET:  * No tourniquets in log *  DICTATION: .Note written in EPIC  PLAN OF CARE: Admit to inpatient   PATIENT DISPOSITION:  PACU - hemodynamically stable.   Delay start of Pharmacological VTE agent (>24hrs) due to surgical blood loss or risk of bleeding: yes

## 2015-12-04 NOTE — Progress Notes (Signed)
PROGRESS NOTE  Subjective:   82 y.o. male with past medical history of PAF (Failed Tikosyn, on Eliquis for anticoagulation), Type 2 DM, HTN, and Hypothyroidism who presented to [facility] ED on 11/04/2015 for a cough and shortness of breath  Has a recurrent right pleural effusion   Objective:    Vital Signs:   Temp:  [97.6 F (36.4 C)-97.7 F (36.5 C)] 97.6 F (36.4 C) (01/06 1201) Pulse Rate:  [76-83] 78 (01/06 1201) Resp:  [18-19] 18 (01/06 1201) BP: (97-114)/(42-55) 99/42 mmHg (01/06 1201) SpO2:  [92 %-99 %] 97 % (01/06 1201) Weight:  [175 lb 11.2 oz (79.697 kg)] 175 lb 11.2 oz (79.697 kg) (01/06 0528)  Last BM Date: 12/03/15   24-hour weight change: Weight change: -8 lb 6.4 oz (-3.81 kg)  Weight trends: Filed Weights   12/02/15 2021 12/03/15 0423 12/04/15 0528  Weight: 178 lb 8 oz (80.967 kg) 177 lb 1.6 oz (80.332 kg) 175 lb 11.2 oz (79.697 kg)    Intake/Output:  01/05 0701 - 01/06 0700 In: 460 [P.O.:460] Out: 400 [Urine:400] Total I/O In: 0  Out: 200 [Urine:200]   Physical Exam: BP 99/42 mmHg  Pulse 78  Temp(Src) 97.6 F (36.4 C) (Oral)  Resp 18  Ht 6\' 3"  (1.905 m)  Wt 175 lb 11.2 oz (79.697 kg)  BMI 21.96 kg/m2  SpO2 97%  Wt Readings from Last 3 Encounters:  12/04/15 175 lb 11.2 oz (79.697 kg)  12/01/15 184 lb (83.462 kg)  11/27/15 175 lb (79.379 kg)    General: Vital signs reviewed and noted.   Head: Normocephalic, atraumatic.  Eyes: conjunctivae/corneas clear.  EOM's intact.   Throat: normal  Neck:  normal   Lungs:    decreased breath sound right base   Heart:  Irreg. Irreg.   Abdomen:  Soft, non-tender, non-distended    Extremities: No significant edema    Neurologic: A&O X3, CN II - XII are grossly intact.   Psych: Normal     Labs: BMET:  Recent Labs  12/03/15 0307 12/04/15 0330  NA 140 137  K 4.1 4.2  CL 107 106  CO2 25 23  GLUCOSE 135* 107*  BUN 16 15  CREATININE 1.07 1.17  CALCIUM 8.4* 8.3*    Liver function  tests: No results for input(s): AST, ALT, ALKPHOS, BILITOT, PROT, ALBUMIN in the last 72 hours. No results for input(s): LIPASE, AMYLASE in the last 72 hours.  CBC:  Recent Labs  12/03/15 0307 12/04/15 0330  WBC 4.5 5.4  HGB 10.8* 11.7*  HCT 32.4* 36.0*  MCV 97.6 97.3  PLT 129* 133*    Cardiac Enzymes: No results for input(s): CKTOTAL, CKMB, TROPONINI in the last 72 hours.  Coagulation Studies: No results for input(s): LABPROT, INR in the last 72 hours.  Other: Invalid input(s): POCBNP No results for input(s): DDIMER in the last 72 hours. No results for input(s): HGBA1C in the last 72 hours. No results for input(s): CHOL, HDL, LDLCALC, TRIG, CHOLHDL in the last 72 hours.  Recent Labs  12/03/15 1004  TSH 8.142*   No results for input(s): VITAMINB12, FOLATE, FERRITIN, TIBC, IRON, RETICCTPCT in the last 72 hours.   Other results:  tele  ( personally reviewed )  Atrial fib   Medications:    Infusions:    Scheduled Medications: . amiodarone  200 mg Oral BID  . azelastine  2 spray Each Nare BID  . digoxin  0.125 mg Oral QODAY  . finasteride  5 mg Oral Daily  . furosemide  20 mg Oral QODAY  . insulin aspart  0-9 Units Subcutaneous TID WC  . insulin aspart  4-8 Units Subcutaneous TID WC  . levofloxacin (LEVAQUIN) IV  750 mg Intravenous To SS-Surg  . levothyroxine  25 mcg Oral QAC breakfast  . lipase/protease/amylase  12,000 Units Oral TID AC  . metoprolol tartrate  25 mg Oral BID  . tamsulosin  0.4 mg Oral BID  . vitamin C  500 mg Oral Daily    Assessment/ Plan:   Principal Problem:   Recurrent pleural effusion on right Active Problems:   Persistent atrial fibrillation (HCC)   Paroxysmal VT-proarrhythmia 2/2 tikosyn   DOE (dyspnea on exertion)   Chronic systolic CHF (congestive heart failure) ([place])  1. Acute Systolic Congestive Heart Failure His EF is low but he has not had any worsening symptoms of CHF. In fact, he is feeling better. He was admitted  with recurrent right pleural effusion  Continue current meds.  I have reviewed the cath from his previous admission  No changes.   2. Paroxysmal Atrial Fibrillation HR is stable , Eliquis is now on hold forchest tube    3. Type 2 DM - per admitting team  4. HTN Well controlled   5. Hypothyroidism TSH is still high. Plans per medical team   6. Right Pleural Effusion Has a recurrent right pleural effusion  Further plans per TCTS and IM .    Disposition:  Length of Stay: 2  Ramond Dial., MD, [facility] 12/04/2015, 12:21 PM Office [phone] Pager [phone]

## 2015-12-04 NOTE — Telephone Encounter (Signed)
New message     Pharmacy calling amiodarone 200 mg - unable to fill rx need physician name not the APP.

## 2015-12-04 NOTE — Care Management Important Message (Signed)
Important Message  Patient Details  Name: [name] MRN: [id number] Date of Birth: 1925/01/11   Medicare Important Message Given:  Yes    [name] P [name] 12/04/2015, 2:26 PM

## 2015-12-04 NOTE — Telephone Encounter (Signed)
Have not received form as of 12/04/15

## 2015-12-04 NOTE — Consult Note (Signed)
   [facility] CM Inpatient Consult   12/04/2015  [name] 1925-05-09 [id number] Referral received to assess for care management services. Explained that [place] [facility] is a covered benefit of insurance.   Met with the patient regarding the benefits of [facility] Care Management services of his Medicare [place] beneficiary.  He endorses that Dr. [name] is his primary care provider.  Review information for [facility] Care Management and a brochure was provided with contact information.   Explained that [facility] does not interfere with or replace any services arranged by the inpatient care management staff.  Patient states he feel that they have full services as he is a resident of [place].  States no other services were needed but would share brochure information with his wife.  For questions, please contact: Natividad Brood, RN BSN [facility] Liaison  [phone] business mobile phone

## 2015-12-04 NOTE — Progress Notes (Signed)
Ring and underclothes brought back to pts room by nurse tech from the floor

## 2015-12-05 LAB — GLUCOSE, CAPILLARY
GLUCOSE-CAPILLARY: 158 mg/dL — AB (ref 65–99)
Glucose-Capillary: 128 mg/dL — ABNORMAL HIGH (ref 65–99)
Glucose-Capillary: 83 mg/dL (ref 65–99)
Glucose-Capillary: 72 mg/dL (ref 65–99)

## 2015-12-05 MED ORDER — INSULIN ASPART 100 UNIT/ML ~~LOC~~ SOLN: 3.0000 [IU] | Freq: Every day | SUBCUTANEOUS | Status: DC

## 2015-12-05 MED ORDER — INSULIN ASPART 100 UNIT/ML ~~LOC~~ SOLN
5.0000 [IU] | Freq: Two times a day (BID) | SUBCUTANEOUS | Status: DC
  Administered 2015-12-05 – 2015-12-06 (×3): 5 [IU] via SUBCUTANEOUS

## 2015-12-05 MED ORDER — INSULIN ASPART 100 UNIT/ML ~~LOC~~ SOLN: 5.0000 [IU] | Freq: Two times a day (BID) | SUBCUTANEOUS | Status: DC

## 2015-12-05 MED ORDER — APIXABAN 2.5 MG PO TABS
2.5000 mg | ORAL_TABLET | Freq: Two times a day (BID) | ORAL | Status: DC
  Administered 2015-12-05 – 2015-12-06 (×3): 2.5 mg via ORAL
  Filled 2015-12-05 (×3): qty 1

## 2015-12-05 NOTE — Progress Notes (Signed)
[facility]                                                                              Patient Demographics  [name], is a 82 y.o. male, DOB - [birth year], [id number]  Admit date - 12/02/2015   Admitting Physician Rise Patience, MD  Outpatient Primary MD for the patient is [name], MD  LOS - 3   Chief Complaint  Patient presents with  . Shortness of Breath       Brief HPI   [name] is a 82 y.o. male with history of chronic systolic chf heart failure, atrial fibrillation, hypothyroidism, diabetes mellitus, chronic anemia who was admitted in December 2016 for pleural effusion and has had thoracentesis twice presented to the ER because of dyspnea and chest x-ray showing recurrence of pleural effusion. Patient reported that he has been getting short of breath progressively after discharge last month and his PCP had ordered chest x-ray which showed recurrence of pleural effusion and was advised to come to the ER. Chest x-ray showed worsening of pleural effusion and patient was admitted for thoracentesis. Patient denied any chest pain. Has some nonproductive cough. Denied any fever or chills. Patient does have mild edema of the lower extremities.   Assessment & Plan     Recurrent right-sided pleural effusion - cause possibly due to chronic systolic CHF, entirely not clear.  - Patient had last thoracentesis on 12/9, 12/7, cytology showed no malignancy. CT chest had shown no malignancy.  - continue to hold Apixaban.  last dose on 12/02/15 a.m.  - Due to frequent recurrent pleural effusion, cardiology and cardiothoracic surgery consulted.  -  Pleurx catheter placed on 1/6, 2 L drained. Postop chest x-ray do not show any complications - Await CTVS recommendations for disposition.   Chronic atrial fibrillation - rate controlled.  - Restart apixaban - Continue amiodarone metoprolol and digoxin. - [place] vasc 4   Chronic systolic heart failure last  EF measured was 25% - - continue Lasix.   - cardiology following   Diabetes mellitus type 2 - -  continue home medications with sliding scale coverage.  Chronic anemia - follow CBC.  Hypothyroidism  - Continue  Synthroid.  History of malignant neoplasm of the gallbladder.  Code Status: full CODE STATUS  Family Communication: Discussed in detail with the patient, all imaging results, lab results explained to the patient    Disposition Plan: DC  when cleared by CTVS   Time Spent in minutes 15 minutes  Procedures  None   Consults   Cardiology CTVS  DVT Prophylaxis   SCD's, apixaban on hold   Medications  Scheduled Meds: . amiodarone  200 mg Oral BID  . azelastine  2 spray Each Nare BID  . digoxin  0.125 mg Oral QODAY  . finasteride  5 mg Oral Daily  . furosemide  20 mg Oral QODAY  . insulin aspart  0-9 Units Subcutaneous TID WC  . insulin aspart  3 Units Subcutaneous Q lunch  . insulin aspart  5 Units Subcutaneous BID WC  . levothyroxine  25 mcg Oral QAC breakfast  .  lipase/protease/amylase  12,000 Units Oral TID AC  . metoprolol tartrate  25 mg Oral BID  . tamsulosin  0.4 mg Oral BID  . vitamin C  500 mg Oral Daily   Continuous Infusions: . lactated ringers        Antibiotics   Anti-infectives    Start     Dose/Rate Route Frequency Ordered Stop   12/04/15 1314  [MAR Hold]  levofloxacin (LEVAQUIN) IVPB 750 mg     (MAR Hold since 12/04/15 1251)   750 mg 100 mL/hr over 90 Minutes Intravenous To [facility]Stay [facility] 12/03/15 1658 12/04/15 1517        Subjective:   [name] was seen and examined today. Feels a lot better today, denies any specific complaints, Pleurx catheter placed.    Patient denies dizziness, abdominal pain, N/V/D/C, new weakness, numbess, tingling. No acute events overnight.    Objective:   Blood pressure 97/40, pulse 66, temperature 98 F (36.7 C), temperature source Oral, resp. rate 18, height 6\' 3"  (1.905 m), weight 76.522 kg  (168 lb 11.2 oz), SpO2 96 %.  Wt Readings from Last 3 Encounters:  12/05/15 76.522 kg (168 lb 11.2 oz)  12/01/15 83.462 kg (184 lb)  11/27/15 79.379 kg (175 lb)     Intake/Output Summary (Last 24 hours) at 12/05/15 1017 Last data filed at 12/05/15 [id number]  Gross per 24 hour  Intake   1120 ml  Output   1500 ml  Net   -380 ml    Exam  General: Alert and oriented x 3, NAD  HEENT:  PERRLA, EOMI  Neck: Supple, no JVD, no masses  CVS: S1 S2 auscultated, no rubs, murmurs or gallops. Regular rate and rhythm.  Respiratory: Decreased breath sound on the right however significant improved, Pleurx catheter +  Abdomen: Soft, NT, ND, NBS  Ext: no cyanosis clubbing or edema  Neuro: no new deficits  Skin: No rashes  Psych: Normal affect and demeanor, alert and oriented x3    Data Review   Micro Results No results found for this or any previous visit (from the past 240 hour(s)).  Radiology Reports Dg Chest 1 View  11/06/2015  CLINICAL DATA:  Post right thoracentesis EXAM: CHEST 1 VIEW COMPARISON:  11/05/2015 FINDINGS: Cardiomediastinal silhouette is stable. The right pleural effusion has resolved. Mild bilateral basilar atelectasis left greater than right. Right hilar catheter is stable in position. No segmental infiltrate or pulmonary edema. No pneumothorax. IMPRESSION: The right pleural effusion has resolved. Mild bilateral basilar atelectasis left greater than right. Right hilar catheter is stable in position. No segmental infiltrate or pulmonary edema. No pneumothorax. Electronically Signed   By: Lahoma Crocker M.D.   On: 11/06/2015 17:13   Dg Chest 2 View  12/01/2015  CLINICAL DATA:  Follow up right pleural effusion EXAM: CHEST  2 VIEW COMPARISON:  November 09, 2015 FINDINGS: No pneumothorax. The catheter in the right pulmonary artery is stable. There is a right-sided pleural effusion with underlying opacity, increased in the interval. The effusion is now moderate in size. No other  interval changes or acute abnormalities identified. IMPRESSION: Increasing right-sided pleural effusion with underlying opacity. Electronically Signed   By: Dorise Bullion III M.D   On: 12/01/2015 15:18   Ct Angio Chest Pe W/cm &/or Wo Cm  11/05/2015  CLINICAL DATA:  Shortness of breath, pleural effusion. History of sarcoma, head thoracentesis yesterday EXAM: CT ANGIOGRAPHY CHEST WITH CONTRAST TECHNIQUE: Multidetector CT imaging of the chest was performed using the standard protocol  during bolus administration of intravenous contrast. Multiplanar CT image reconstructions and MIPs were obtained to evaluate the vascular anatomy. CONTRAST:  9mL OMNIPAQUE IOHEXOL 350 MG/ML SOLN COMPARISON:  CT abdomen 12/16/ 15 images of the lung bases FINDINGS: Sagittal images of the spine shows osteopenia and degenerative changes thoracic spine. Sagittal view of the sternum is unremarkable. Atherosclerotic calcifications of thoracic aorta and coronary arteries. The study is of excellent technical quality. No pulmonary embolus is noted. Bilateral perihilar peribronchial thickening is noted. There is no mediastinal hematoma or adenopathy. Images of the upper abdomen shows partially visualized postsurgical changes post Whipple procedure. There is moderate size right pleural effusion. Small left pleural effusion. There is atelectasis in left lower lobe posteriorly. There is patchy airspace disease/infiltrate in right middle lobe and right lower lobe. Findings are highly suspicious for pneumonia. There is a catheter fragment in main pulmonary artery extending from just above the origin in right main pulmonary artery and a small branch. The catheter measures at least 12.5 cm in length. This was only partially visualized on prior exam stable in position from prior exam. Review of the MIP images confirms the above findings. IMPRESSION: 1. No pulmonary embolus. 2. There is moderate size right pleural effusion. Small left pleural effusion.  Atelectasis noted in left lower lobe posteriorly. There is consolidation with air bronchogram in right lower lobe and right middle lobe highly suspicious for infiltrate/pneumonia. 3. Mild perihilar bronchitic changes. 4. No mediastinal hematoma or adenopathy. 5. There is a catheter fragment in main pulmonary artery extending from just above the origin in right main pulmonary artery and a small branch. The catheter measures at least 12.5 cm in length. This was only partially visualized on prior exam stable in position from prior exam. These results were called by telephone at the time of interpretation on 11/05/2015 at 1:57 pm to Dr. Reyne Dumas , who verbally acknowledged these results. Electronically Signed   By: Lahoma Crocker M.D.   On: 11/05/2015 14:02   Dg Chest Port 1 View  12/04/2015  CLINICAL DATA:  Follow-up from right-sided PleurX catheter insertion for recurrent right-sided pneumothorax. EXAM: PORTABLE CHEST 1 VIEW COMPARISON:  Portable chest x-ray of December 02, 2015 FINDINGS: The PleurX catheter is in place on the right with its tip projecting over the medial cardiophrenic angle. The pleural effusion volume has markedly decreased. There is no pneumothorax. The left lung is well-expanded. There is no left-sided pneumothorax. The heart is mildly enlarged but stable. The central pulmonary vascularity is prominent. The mediastinum is normal in width. A catheter fragment is stable projecting over the right mid hemi thorax. IMPRESSION: There is no postprocedure complication following PleurX catheter placement on the right. The right pleural effusion appears to bemuch smaller and is nearly not visible. Electronically Signed   By: David  Martinique M.D.   On: 12/04/2015 15:38   Dg Chest Port 1 View  12/02/2015  CLINICAL DATA:  Progressive shortness of breath. History of right pleural effusion. EXAM: PORTABLE CHEST 1 VIEW COMPARISON:  Frontal and lateral views yesterday. Chest CT 11/05/2015 FINDINGS: Catheter in the  right hemithorax, corresponding to right pulmonary artery on CT unchanged in position. Progressive right pleural effusion and hazy opacity throughout the right hemithorax. Stable blunting of left costophrenic angle. Cardiomediastinal contours are unchanged. No pneumothorax. The cortex of the right proximal humerus appears irregular, only partially included in the field of view. IMPRESSION: 1. Increasing right pleural effusion, moderate to large in size. 2. Question of irregular cortex right proximal  humerus, only partially included. Dedicated humerus radiographs recommended, as well as correlation for any pain in this region. Electronically Signed   By: Jeb Levering M.D.   On: 12/02/2015 22:01   Dg Chest Port 1 View  11/09/2015  CLINICAL DATA:  Short of breath, chest pain EXAM: PORTABLE CHEST 1 VIEW COMPARISON:  Radiograph 11/06/2015, 10/30/2012 FINDINGS: There is a catheter in the RIGHT main pulmonary artery unchanged from 10/30/2012. Normal cardiac silhouette. There is increased airspace density in the RIGHT lower lobe. Small RIGHT effusion. No pneumothorax. IMPRESSION: Increasing RIGHT lower lobe airspace disease and small effusions concerning for pneumonia Electronically Signed   By: Suzy Bouchard M.D.   On: 11/09/2015 14:45   Dg Swallowing Func-speech Pathology  11/06/2015  Objective Swallowing Evaluation:   Patient Details Name: [name] MRN: [id number] Date of Birth: Jan 01, 1925 Today's Date: 11/06/2015 Time: SLP Start Time (ACUTE ONLY): 1009-SLP Stop Time (ACUTE ONLY): 1020 SLP Time Calculation (min) (ACUTE ONLY): 11 min Past Medical History: Past Medical History Diagnosis Date . Acid reflux disease    history of . H/O: GI bleed  . Paroxysmal atrial fibrillation (HCC)    chronic anticoag; tikosyn . Benign prostatic hypertrophy    history of . Hepatic damage march 2009   retained hepatic stone , intrahepatic duct catheter . CAD (coronary artery disease)  . Hypertension  . Malignant neoplasm of  other specified sites of gallbladder and extrahepatic bile ducts 1998   s/p whipple and chole . 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 Seizures ([place])  . Ventricular tachycardia ? Tikosyn proarrhtyhmia  . Cataracts, bilateral  . Diabetes mellitus    insulin dep . Pleural effusion 10/2015 . Shortness of breath dyspnea  Past Surgical History: Past Surgical History Procedure Laterality Date . Cardioversion   . Cholecystectomy     history of . Partial gastrectomy     history of . Shoulder surgery     left shoulder repair with 2 pens inserted . Whipple procedure     operation . 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 Inguinal hernia repair     inguinal herniorrhaphy, right... history of . Appendectomy     history of . Cardioversion  03/07/2012   Procedure: CARDIOVERSION;  Surgeon: Deboraha Sprang, MD;  Location: [facility];  Service: Cardiovascular;  Laterality: N/A; . Cardioversion N/A 08/10/2015   Procedure: CARDIOVERSION;  Surgeon: Thayer Headings, MD;  Location: [facility];  Service: Cardiovascular;  Laterality: N/A; HPI: Stony Lipper is a 82 y.o. male, with permanent atrial fibrillation on Eliquis, diabetes mellitus, seizures, and history of Whipple procedure who presents to the emergency department with shortness of breath. Mr. Suite saw his primary care physician for cough and shortness of breath and was sent to the emergency department after an x-ray showed a large right pleural effusion. Mr. Locastro reports that he has been feeling fatigued and badly for about the past month. But over the last week he has become much worse. He is no longer able to walk his daily 1/2 mile. He is unable to sleep. He has a dry cough but occasionally brings up frothy sputum. He also coughs when he eats or drinks liquids,  No Data Recorded Assessment / Plan / Recommendation CHL IP CLINICAL IMPRESSIONS 11/06/2015 Therapy Diagnosis Moderate pharyngeal phase dysphagia Clinical Impression Pt presents with moderate  pharyngeal phase dysphagia characterized by one incidence of flash penetration with larger boluses of liquids, but clearance noted without strategies utilized;  pt also exhibited moderate vallecular residue with puree-solid consistencies which required multiple repetitive swallows, effortful swallows and alternating solids/liquids to clear valleculae which place him at a moderate aspiration risk without use of strategies during po intake; decreased epiglottic inversion noted intermittently during study as well which also increases his aspiration risk.  Pt did not aspirate any consistency, but is certainly at risk if strategies are not utilized during po intake; ST to f/u during hospitalization for diet tolerance/pt education re: aspiration risk/swallowing strategy use and should be f/u with SLP at SNF. Impact on safety and function Moderate aspiration risk   CHL IP TREATMENT RECOMMENDATION 11/06/2015 Treatment Recommendations Therapy as outlined in treatment plan below   Prognosis 11/06/2015 Prognosis for Safe Diet Advancement Good Barriers to Reach Goals -- Barriers/Prognosis Comment -- CHL IP DIET RECOMMENDATION 11/06/2015 SLP Diet Recommendations Regular solids;Thin liquid Liquid Administration via Cup Medication Administration Whole meds with liquid Compensations Slow rate;Small sips/bites;Clear throat intermittently;Effortful swallow Postural Changes Seated upright at 90 degrees   CHL IP OTHER RECOMMENDATIONS 11/06/2015 Recommended Consults -- Oral Care Recommendations Oral care BID Other Recommendations --   CHL IP FOLLOW UP RECOMMENDATIONS 11/05/2015 Follow up Recommendations ST f/u at SNF upon d/c   CHL IP FREQUENCY AND DURATION 11/06/2015 Speech Therapy Frequency (ACUTE ONLY) min 2x/week Treatment Duration 1 week      CHL IP ORAL PHASE 11/06/2015 Oral Phase WFL Oral - Pudding Teaspoon -- Oral - Pudding Cup -- Oral - Honey Teaspoon -- Oral - Honey Cup -- Oral - Nectar Teaspoon -- Oral - Nectar Cup -- Oral - Nectar  Straw -- Oral - Thin Teaspoon -- Oral - Thin Cup -- Oral - Thin Straw -- Oral - Puree -- Oral - Mech Soft -- Oral - Regular -- Oral - Multi-Consistency -- Oral - Pill -- Oral Phase - Comment --  CHL IP PHARYNGEAL PHASE 11/06/2015 Pharyngeal Phase Impaired Pharyngeal- Pudding Teaspoon -- Pharyngeal -- Pharyngeal- Pudding Cup -- Pharyngeal -- Pharyngeal- Honey Teaspoon -- Pharyngeal -- Pharyngeal- Honey Cup -- Pharyngeal -- Pharyngeal- Nectar Teaspoon -- Pharyngeal -- Pharyngeal- Nectar Cup -- Pharyngeal -- Pharyngeal- Nectar Straw -- Pharyngeal -- Pharyngeal- Thin Teaspoon -- Pharyngeal -- Pharyngeal- Thin Cup Penetration/Aspiration during swallow;Other (Comment) Pharyngeal (No Data) Pharyngeal- Thin Straw -- Pharyngeal -- Pharyngeal- Puree Reduced epiglottic inversion;Pharyngeal residue - valleculae;Compensatory strategies attempted (with notebox) Pharyngeal -- Pharyngeal- Mechanical Soft -- Pharyngeal -- Pharyngeal- Regular Reduced epiglottic inversion;Pharyngeal residue - valleculae;Compensatory strategies attempted (with notebox) Pharyngeal -- Pharyngeal- Multi-consistency -- Pharyngeal -- Pharyngeal- Pill -- Pharyngeal -- Pharyngeal Comment --  CHL IP CERVICAL ESOPHAGEAL PHASE 11/06/2015 Cervical Esophageal Phase WFL Pudding Teaspoon -- Pudding Cup -- Honey Teaspoon -- Honey Cup -- Nectar Teaspoon -- Nectar Cup -- Nectar Straw -- Thin Teaspoon -- Thin Cup -- Thin Straw -- Puree -- Mechanical Soft -- Regular -- Multi-consistency -- Pill -- Cervical Esophageal Comment -- ADAMS,PAT, M.S., CCC-SLP 11/06/2015, 10:31 AM              Dg C-arm 1-60 Min-no Report  12/04/2015  CLINICAL DATA: pleural catheter insertion C-ARM 1-60 MINUTES Fluoroscopy was utilized by the requesting physician.  No radiographic interpretation.   US Thoracentesis Asp Pleural Space W/img Guide  11/06/2015  Ardis Rowan, PA-C     11/06/2015  4:37 PM Successful US guided right thoracentesis. Yielded 1.6 liters of clear yellow fluid. Pt  tolerated procedure well. No immediate complications. Specimen was not sent for labs. CXR ordered. Judie Grieve BLAIR PA-C 11/06/2015 4:36 PM  CBC  Recent Labs Lab 12/02/15 1401 12/03/15 0307 12/04/15 0330  WBC 5.9 4.5 5.4  HGB 12.3* 10.8* 11.7*  HCT 37.4* 32.4* 36.0*  PLT 149* 129* 133*  MCV 95.9 97.6 97.3  MCH 31.5 32.5 31.6  MCHC 32.9 33.3 32.5  RDW 14.5 14.6 14.4    Chemistries   Recent Labs Lab 12/02/15 1401 12/03/15 0307 12/04/15 0330  NA 140 140 137  K 5.0 4.1 4.2  CL 107 107 106  CO2 25 25 23   GLUCOSE 138* 135* 107*  BUN 19 16 15   CREATININE 1.30* 1.07 1.17  CALCIUM 8.7* 8.4* 8.3*   ------------------------------------------------------------------------------------------------------------------ estimated creatinine clearance is 47.2 mL/min (by C-G formula based on Cr of 1.17). ------------------------------------------------------------------------------------------------------------------ No results for input(s): HGBA1C in the last 72 hours. ------------------------------------------------------------------------------------------------------------------ No results for input(s): CHOL, HDL, LDLCALC, TRIG, CHOLHDL, LDLDIRECT in the last 72 hours. ------------------------------------------------------------------------------------------------------------------  Recent Labs  12/03/15 1004  TSH 8.142*   ------------------------------------------------------------------------------------------------------------------ No results for input(s): VITAMINB12, FOLATE, FERRITIN, TIBC, IRON, RETICCTPCT in the last 72 hours.  Coagulation profile No results for input(s): INR, PROTIME in the last 168 hours.  No results for input(s): DDIMER in the last 72 hours.  Cardiac Enzymes No results for input(s): CKMB, TROPONINI, MYOGLOBIN in the last 168 hours.  Invalid input(s):  CK ------------------------------------------------------------------------------------------------------------------ Invalid input(s): [place]  12/04/15 0611 12/04/15 1200 12/04/15 1513 12/04/15 1559 12/04/15 2050 12/05/15 0613  GLUCAP 97 107* 109* 111* 148* 128*     RAI,RIPUDEEP M.D. [facility] 12/05/2015, 10:17 AM  Pager: [phone] Between 7am to 7pm - call Pager - [phone]  After 7pm go to [email or web address] - password [email or web address]  Call night coverage person covering after 7pm

## 2015-12-05 NOTE — Progress Notes (Signed)
Pt requesting to see doctor for update regarding pleurx catheter.  Spoke with Dr. Roxy Manns and instructed pt  need to continue with order for pleux cath.  Karie Kirks, Therapist, sports.

## 2015-12-06 LAB — GLUCOSE, CAPILLARY
GLUCOSE-CAPILLARY: 115 mg/dL — AB (ref 65–99)
Glucose-Capillary: 129 mg/dL — ABNORMAL HIGH (ref 65–99)
Glucose-Capillary: 122 mg/dL — ABNORMAL HIGH (ref 65–99)

## 2015-12-06 MED ORDER — TRAMADOL HCL 50 MG PO TABS: 50.0000 mg | ORAL_TABLET | Freq: Four times a day (QID) | ORAL | Status: DC | PRN

## 2015-12-06 NOTE — Discharge Instructions (Signed)
Pleural Effusion °A pleural effusion is an abnormal buildup of fluid in the layers of tissue between your lungs and the inside of your chest (pleural space). These two layers of tissue that line both your lungs and the inside of your chest are called pleura. Usually, there is no air in the space between the pleura, only a thin layer of fluid. If left untreated, a large amount of fluid can build up and cause the lung to collapse. A pleural effusion is usually caused by another disease that requires treatment. °The two main types of pleural effusion are: °· Transudative pleural effusion. This happens when fluid leaks into the pleural space because of a low protein count in your blood or high blood pressure in your vessels. Heart failure often causes this. °· Exudative infusion. This occurs when fluid collects in the pleural space from blocked blood vessels or lymph vessels. Some lung diseases, injuries, and cancers can cause this type of effusion. °CAUSES °Pleural effusion can be caused by: °· Heart failure. °· A blood clot in the lung (pulmonary embolism). °· Pneumonia. °· Cancer. °· Liver failure (cirrhosis). °· Kidney disease. °· Complications from surgery, such as from open heart surgery. °SIGNS AND SYMPTOMS °In some cases, pleural effusion may cause no symptoms. Symptoms can include: °· Shortness of breath, especially when lying down. °· Chest pain, often worse when taking a deep breath. °· Fever. °· Dry cough that is lasting (chronic). °· Hiccups. °· Rapid breathing. °An underlying condition that is causing the pleural effusion (such as heart failure, pneumonia, blood clots, tuberculosis, or cancer) may also cause additional symptoms. °DIAGNOSIS °Your health care provider may suspect pleural effusion based on your symptoms and medical history. Your health care provider will also do a physical exam and a chest X-ray. If the X-ray shows there is fluid in your chest, you may need to have this fluid removed using a  needle (thoracentesis) so it can be tested. °You may also have: °· Imaging studies of the chest, such as: °¨ Ultrasound. °¨ CT scan. °· Blood tests for kidney and liver function. °TREATMENT °Treatment depends on the cause of the pleural effusion. Treatment may include: °· Taking antibiotic medicines to clear up an infection that is causing the pleural effusion. °· Placing a tube in the chest to drain the effusion (tube thoracostomy). This procedure is often used when there is an infection in the fluid. °· Surgery to remove the fibrous outer layer of tissue from the pleural space (decortication). °· Thoracentesis, which can improve cough and shortness of breath. °· A procedure to put medicine into the chest cavity to seal the pleural space to prevent fluid buildup (pleurodesis). °· Chemotherapy and radiation therapy. These may be required in the case of cancerous (malignant) pleural effusion. °HOME CARE INSTRUCTIONS °· Take medicines only as directed by your health care provider. °· Keep track of how long you can gently exercise before you get short of breath. Try simply walking at first. °· Do not use any tobacco products, including cigarettes, chewing tobacco, or electronic cigarettes. If you need help quitting, ask your health care provider. °· Keep all follow-up visits as directed by your health care provider. This is important. °SEEK MEDICAL CARE IF: °· The amount of time that you are able to exercise decreases or does not improve with time. °· You have pain or signs of infection at the puncture site if you had thoracentesis. Watch for: °¨ Drainage. °¨ Redness. °¨ Swelling. °· You have a fever. °  SEEK IMMEDIATE MEDICAL CARE IF: °· You are short of breath. °· You develop chest pain. °· You develop a new cough. °MAKE SURE YOU: °· Understand these instructions. °· Will watch your condition. °· Will get help right away if you are not doing well or get worse. °  °This information is not intended to replace advice  given to you by your health care provider. Make sure you discuss any questions you have with your health care provider. °  °Document Released: 11/14/2005 Document Revised: 12/05/2014 Document Reviewed: 04/09/2014 °Elsevier Interactive Patient Education ©2016 [employer] ° °

## 2015-12-06 NOTE — Discharge Summary (Signed)
Physician Discharge Summary   Patient ID: [name] MRN: [id number] DOB/AGE: 03/10/1925 82 y.o.  Admit date: 12/02/2015 Discharge date: 12/06/2015  Primary Care Physician:  Casey Calico, MD  Discharge Diagnoses:     . DOE (dyspnea on exertion) . Recurrent pleural effusion on right . Persistent atrial fibrillation ([name]) . Chronic systolic CHF (congestive heart failure) ([name])  Consults:   Cardiology Cardiac thoracic surgery, Dr. Cyndia Hoffman  Recommendations for Outpatient Follow-up:  1. Home health RN, PT arranged 2. Per CT surgery, home health RN to drain Pleurx catheter every 3 days until it stops draining 3. Please repeat CBC/BMET at next visit 4. Follow-up with CT surgery in 2 weeks   DIET: Heart healthy diet    Allergies:   Allergies  Allergen Reactions  . Lisinopril Cough  . Toujeo Solostar [Insulin Glargine] Other (See Comments)    Shaking, felt bad all over  . Clindamycin Diarrhea  . Other Other (See Comments)    toujeo with perioral tingling only - pt refuses to take further  . Oxycodone-Acetaminophen Diarrhea  . Penicillins Hives    Has patient had a PCN reaction causing immediate rash, facial/tongue/throat swelling, SOB or lightheadedness with hypotension: Yes Has patient had a PCN reaction causing severe rash involving mucus membranes or skin necrosis: no  Has patient had a PCN reaction that required hospitalization No Has patient had a PCN reaction occurring within the last 10 years: No If all of the above answers are "NO", then may proceed with Cephalosporin use.     DISCHARGE MEDICATIONS: Current Discharge Medication List    START taking these medications   Details  traMADol (ULTRAM) 50 MG tablet Take 1 tablet (50 mg total) by mouth every 6 (six) hours as needed for moderate pain. Qty: 30 tablet, Refills: 0      CONTINUE these medications which have NOT CHANGED   Details  amiodarone (PACERONE) 200 MG tablet Take 1 tablet (200 mg total) by mouth  2 (two) times daily. Qty: 180 tablet, Refills: 2    apixaban (ELIQUIS) 2.5 MG TABS tablet Take 1 tablet (2.5 mg total) by mouth 2 (two) times daily. Qty: 60 tablet, Refills: 2    Ascorbic Acid (VITAMIN C) 500 MG tablet Take 500 mg by mouth daily.     azelastine (ASTELIN) 0.1 % nasal spray Place 2 sprays into both nostrils 2 (two) times daily. Use in each nostril as directed Qty: 90 mL, Refills: 3   Associated Diagnoses: Non-seasonal allergic rhinitis due to pollen    [employer] GLUCOSAMINE-CHONDROITIN PO Take 1 tablet by mouth daily. 1200/600 mg    digoxin (LANOXIN) 0.125 MG tablet Take 1 tablet (0.125 mg total) by mouth every other day. Qty: 90 tablet, Refills: 2    Emollient (AMLACTIN XL) LOTN Apply 1 application topically daily. Apply lotion to legs    finasteride (PROSCAR) 5 MG tablet Take 1 tablet (5 mg total) by mouth daily. Qty: 90 tablet, Refills: 3   Associated Diagnoses: BPH (benign prostatic hypertrophy) with urinary obstruction    furosemide (LASIX) 20 MG tablet Take 1 tablet (20 mg total) by mouth every other day. Qty: 45 tablet, Refills: 3   Associated Diagnoses: Venous insufficiency of both lower extremities    insulin aspart (NOVOLOG FLEXPEN) 100 UNIT/ML FlexPen Inject 4-8 Units into the skin 3 (three) times daily with meals. Inject 5 units subcutaneously with breakfast, 3 units with lunch and 5 units with supper    levothyroxine (SYNTHROID, LEVOTHROID) 25 MCG tablet Take 1 tablet (  25 mcg total) by mouth daily before breakfast. Qty: 30 tablet, Refills: 5    lipase/protease/amylase (CREON) 12000 UNITS CPEP capsule Take 1 capsule (12,000 Units total) by mouth 3 (three) times daily before meals. Qty: 270 capsule, Refills: 3   Associated Diagnoses: Pancreatic insufficiency (HCC)    metoprolol tartrate (LOPRESSOR) 25 MG tablet Take 1 tablet (25 mg total) by mouth 2 (two) times daily. Qty: 180 tablet, Refills: 3    Multiple Vitamins-Minerals (CENTRUM SILVER PO) Take 1 tablet  by mouth daily.     Probiotic Product (PROBIOTIC DAILY PO) Take 3 tablets by mouth daily. 3 tabs once a day with meals.    tamsulosin (FLOMAX) 0.4 MG CAPS capsule Take 1 capsule (0.4 mg total) by mouth 2 (two) times daily. Qty: 180 capsule, Refills: 3   Associated Diagnoses: BPH (benign prostatic hypertrophy) with urinary obstruction    Insulin Pen Needle (B-D UF III MINI PEN NEEDLES) 31G X 5 MM MISC Inject 120 Devices into the skin 4 (four) times daily. Use a new needle for injecting insulin4 times a day Qty: 360 each, Refills: 3   Associated Diagnoses: Type II diabetes mellitus with manifestations ([name])    SALINE NA Place 1 spray into both nostrils 2 (two) times daily. Reported on 12/02/2015    Suvorexant (BELSOMRA) 10 MG TABS Take 1 tablet by mouth at bedtime as needed. Qty: 30 tablet, Refills: 5   Associated Diagnoses: Insomnia due to anxiety and fear         Brief H and P: For complete details please refer to admission H and P, but in brief [name] is a 82 y.o. male with history of chronic systolic chf heart failure, atrial fibrillation, hypothyroidism, diabetes mellitus, chronic anemia who was admitted in December 2016 for pleural effusion and has had thoracentesis twice presented to the ER because of dyspnea and chest x-ray showing recurrence of pleural effusion. Patient reported that he has been getting short of breath progressively after discharge last month and his PCP had ordered chest x-ray which showed recurrence of pleural effusion and was advised to come to the ER. Chest x-ray showed worsening of pleural effusion and patient was admitted for thoracentesis. Patient denied any chest pain. Has some nonproductive cough. Denied any fever or chills. Patient does have mild edema of the lower extremities.  Hospital Course:   Recurrent right-sided pleural effusion - cause possibly due to chronic systolic CHF, entirely not clear.  - Patient had last thoracentesis on 12/9, 12/7,  cytology showed no malignancy. CT chest had shown no malignancy.  -  Due to frequent recurrent pleural effusion, cardiology and cardiothoracic surgery were consulted. The accident was placed on hold. Patient was seen by cardiothoracic surgery, Dr [name] and recommended Pleurx catheter. - Pleurx catheter placed on 1/6, 2 L drained. Postop chest x-ray do not show any complications - Discussed with cardiothoracic surgery PA, Jadene Pierini prior to discharge who recommended home health nurse to drain Pleurx catheter every 3 days, outpatient follow-up with cardiothoracic surgery in 2 weeks.   Chronic atrial fibrillation - rate controlled.  - Continue apixaban - Continue amiodarone metoprolol and digoxin. - [place] vasc 4  Chronic systolic heart failure last EF measured was 25% - - continue Lasix. Currently stable from cardiac standpoint per cardiology, was consulted as well. Patient to continue the current cardiac medications.  Diabetes mellitus type 2 - - continue home medications with sliding scale coverage.  Chronic anemia - - H&H stable  Hypothyroidism  -  Continue Synthroid.  History of malignant neoplasm of the gallbladder.  Day of Discharge BP 96/66 mmHg  Pulse 82  Temp(Src) 97.5 F (36.4 C) (Oral)  Resp 20  Ht 6\' 3"  (1.905 m)  Wt 75.841 kg (167 lb 3.2 oz)  BMI 20.90 kg/m2  SpO2 96%  Physical Exam: General: Alert and awake oriented x3 not in any acute distress. HEENT: anicteric sclera, pupils reactive to light and accommodation CVS: S1-S2 clear no murmur rubs or gallops Chest: Decreased breath sounds at the bases, Pleurx catheter on the right Abdomen: soft nontender, nondistended, normal bowel sounds Extremities: no cyanosis, clubbing or edema noted bilaterally Neuro: Cranial nerves II-XII intact, no focal neurological deficits   The results of significant diagnostics from this hospitalization (including imaging, microbiology, ancillary and laboratory) are listed  below for reference.    LAB RESULTS: Basic Metabolic Panel:  Recent Labs Lab 12/03/15 0307 12/04/15 0330  NA 140 137  K 4.1 4.2  CL 107 106  CO2 25 23  GLUCOSE 135* 107*  BUN 16 15  CREATININE 1.07 1.17  CALCIUM 8.4* 8.3*   Liver Function Tests: No results for input(s): AST, ALT, ALKPHOS, BILITOT, PROT, ALBUMIN in the last 168 hours. No results for input(s): LIPASE, AMYLASE in the last 168 hours. No results for input(s): AMMONIA in the last 168 hours. CBC:  Recent Labs Lab 12/03/15 0307 12/04/15 0330  WBC 4.5 5.4  HGB 10.8* 11.7*  HCT 32.4* 36.0*  MCV 97.6 97.3  PLT 129* 133*   Cardiac Enzymes: No results for input(s): CKTOTAL, CKMB, CKMBINDEX, TROPONINI in the last 168 hours. BNP: Invalid input(s): POCBNP CBG:  Recent Labs Lab 12/05/15 2141 12/06/15 0600  GLUCAP 72 129*    Significant Diagnostic Studies:  Dg Chest Port 1 View  12/02/2015  CLINICAL DATA:  Progressive shortness of breath. History of right pleural effusion. EXAM: PORTABLE CHEST 1 VIEW COMPARISON:  Frontal and lateral views yesterday. Chest CT 11/05/2015 FINDINGS: Catheter in the right hemithorax, corresponding to right pulmonary artery on CT unchanged in position. Progressive right pleural effusion and hazy opacity throughout the right hemithorax. Stable blunting of left costophrenic angle. Cardiomediastinal contours are unchanged. No pneumothorax. The cortex of the right proximal humerus appears irregular, only partially included in the field of view. IMPRESSION: 1. Increasing right pleural effusion, moderate to large in size. 2. Question of irregular cortex right proximal humerus, only partially included. Dedicated humerus radiographs recommended, as well as correlation for any pain in this region. Electronically Signed   By: Jeb Levering M.D.   On: 12/02/2015 22:01    2D ECHO:   Disposition and Follow-up: Discharge Instructions    ([place]) Call MD:  Anytime you have any of  the following symptoms: 1) 3 pound weight gain in 24 hours or 5 pounds in 1 week 2) shortness of breath, with or without a dry hacking cough 3) swelling in the hands, feet or stomach 4) if you have to sleep on extra pillows at night in order to breathe.    Complete by:  As directed      Diet - low sodium heart healthy    Complete by:  As directed      Diet Carb Modified    Complete by:  As directed      Discharge instructions    Complete by:  As directed   Home health nurse to drain the Pleurx catheter every 3 days until it stops draining.  The patient will need to follow-up  with Dr. Cyndia Hoffman or CT surgery office in 2weeks     Increase activity slowly    Complete by:  As directed             DISPOSITION: Independent living facility   DISCHARGE FOLLOW-UP Follow-up Information    Follow up with [facility].   Why:  They will continue to do your home health care at your home   Contact information:   [address] [address] [phone]       Follow up with Gaye Pollack, MD.   Specialty:  Cardiothoracic Surgery   Why:  2 weeks- the office will contact you . Please also obtain a chest xray at [facility] 1/2 hour prior to appointment. It is located in the same office complex   Contact information:   [address] [place] [address] [phone]       Follow up with Casey Calico, MD. Schedule an appointment as soon as possible for a visit in 2 weeks.   Specialty:  Internal Medicine   Why:  for hospital follow-up   Contact information:   [address] [address] [phone]        Time spent on Discharge: 35 minutes  Signed:   Krish Bailly M.D. [facility] 12/06/2015, 11:29 AM Pager: [phone]

## 2015-12-06 NOTE — Progress Notes (Signed)
Patient was discharge at 13:45 accompanied by NT via wheelchair. Patient was sent home via taxi as per patient request. Prescriptions, discharge instructions and education given. Patient verbalized understanding. No signs and symptoms of distress noted. Patient's stable. Telemetry box and IV removed prior to discharge.

## 2015-12-07 ENCOUNTER — Encounter (HOSPITAL_COMMUNITY): Payer: Self-pay | Admitting: Surgery

## 2015-12-07 ENCOUNTER — Telehealth: Payer: Self-pay

## 2015-12-07 NOTE — Telephone Encounter (Signed)
Pt dc after Pleurx catheter.   Pt is on TCM list.   Discharge instructions are to follow up with cardiothoracic surgery in 2 weeks.

## 2015-12-08 ENCOUNTER — Ambulatory Visit: Payer: Medicare Other | Admitting: Physician Assistant

## 2015-12-08 DIAGNOSIS — J90 Pleural effusion, not elsewhere classified: Secondary | ICD-10-CM | POA: Diagnosis not present

## 2015-12-09 ENCOUNTER — Other Ambulatory Visit: Payer: Self-pay | Admitting: Pharmacist

## 2015-12-09 ENCOUNTER — Telehealth: Payer: Self-pay | Admitting: Internal Medicine

## 2015-12-09 NOTE — Patient Outreach (Signed)
[facility]) Care Management  [place]   12/09/2015  [name] 21-Sep-1925 [id number]  Subjective: [name] is a [phone] with a recent hospitalization for dyspnea on exertion, recurrent pleural effusion, persistent atrial fibrillation, and chronic systolic heart failure (EF 25-30% on 11/05/15).  I identified patient as eligible for pharmacy transition of care project.  Medication review completed.  I called patient and read consent.  Patient agreed to participate in transition of care project.  I reviewed all medications with the patient.  Patient reports adherence with medications.    Patient reports weighing daily as instructed and denies weight gain.  Reports weight of 166 lb yesterday and 165 lb today.  Reviewed heart failure zones and action plan with patient.  Patient voices understanding.    Patient has hospital follow up scheduled for 12/17/15 with his primary care provider and 12/23/15 with cardiothoracic.    Objective:   Current Medications: Current Outpatient Prescriptions  Medication Sig Dispense Refill  . amiodarone (PACERONE) 200 MG tablet Take 1 tablet (200 mg total) by mouth 2 (two) times daily. 180 tablet 2  . apixaban (ELIQUIS) 2.5 MG TABS tablet Take 1 tablet (2.5 mg total) by mouth 2 (two) times daily. 60 tablet 2  . Ascorbic Acid (VITAMIN C) 500 MG tablet Take 500 mg by mouth daily.     Marland Kitchen azelastine (ASTELIN) 0.1 % nasal spray Place 2 sprays into both nostrils 2 (two) times daily. Use in each nostril as directed 90 mL 3  . [employer]S GLUCOSAMINE-CHONDROITIN PO Take 1 tablet by mouth daily. 1200/600 mg    . digoxin (LANOXIN) 0.125 MG tablet Take 1 tablet (0.125 mg total) by mouth every other day. 90 tablet 2  . Emollient (AMLACTIN XL) LOTN Apply 1 application topically daily. Apply lotion to legs    . finasteride (PROSCAR) 5 MG tablet Take 1 tablet (5 mg total) by mouth daily. 90 tablet 3  . furosemide (LASIX) 20 MG tablet Take 1 tablet (20 mg  total) by mouth every other day. 45 tablet 3  . insulin aspart (NOVOLOG FLEXPEN) 100 UNIT/ML FlexPen Inject 4-8 Units into the skin 3 (three) times daily with meals. Inject 5 units subcutaneously with breakfast, 3 units with lunch and 5 units with supper    . Insulin Pen Needle (B-D UF III MINI PEN NEEDLES) 31G X 5 MM MISC Inject 120 Devices into the skin 4 (four) times daily. Use a new needle for injecting insulin4 times a day 360 each 3  . levothyroxine (SYNTHROID, LEVOTHROID) 25 MCG tablet Take 1 tablet (25 mcg total) by mouth daily before breakfast. 30 tablet 5  . lipase/protease/amylase (CREON) 12000 UNITS CPEP capsule Take 1 capsule (12,000 Units total) by mouth 3 (three) times daily before meals. 270 capsule 3  . metoprolol tartrate (LOPRESSOR) 25 MG tablet Take 1 tablet (25 mg total) by mouth 2 (two) times daily. 180 tablet 3  . Multiple Vitamins-Minerals (CENTRUM SILVER PO) Take 1 tablet by mouth daily.     . Probiotic Product (PROBIOTIC DAILY PO) Take 3 tablets by mouth daily. 3 tabs once a day with meals.    Marland Kitchen SALINE NA Place 1 spray into both nostrils 2 (two) times daily. Reported on 12/02/2015    . tamsulosin (FLOMAX) 0.4 MG CAPS capsule Take 1 capsule (0.4 mg total) by mouth 2 (two) times daily. 180 capsule 3  . Suvorexant (BELSOMRA) 10 MG TABS Take 1 tablet by mouth at bedtime as needed. (Patient not taking:  Reported on 12/02/2015) 30 tablet 5  . traMADol (ULTRAM) 50 MG tablet Take 1 tablet (50 mg total) by mouth every 6 (six) hours as needed for moderate pain. (Patient not taking: Reported on 12/09/2015) 30 tablet 0   No current facility-administered medications for this visit.   Functional Status: In your present state of health, do you have any difficulty performing the following activities: 12/02/2015 12/02/2015  Hearing? - N  Vision? - N  Difficulty concentrating or making decisions? - N  Walking or climbing stairs? - N  Dressing or bathing? - N  Doing errands, shopping? N -    Fall/Depression Screening: PHQ 2/9 Scores 06/24/2015 04/27/2014 04/25/2014  PHQ - 2 Score 0 0 0    Assessment: 1.  Medication review:   Drugs sorted by system:  Neurologic/Psychologic: none  Cardiovascular: amiodarone, apixaban, digoxin, furosemide, metoprolol tartrate  Pulmonary/Allergy: azelastine nasal spray, saline nasal spray,   Gastrointestinal: pancrelipase  Endocrine: Novolog, levothyroxine  Renal: none  Topical: emollient  Pain: tramadol (patient reports he is not taking)   Vitamins/Minerals: ascorbic acid, multiple vitamin  Infectious Diseases: none  Miscellaneous: glucosamine-chondroitin, probiotic, finasteride, tamsulosin, suvorexant (patient reports he is not taking)    Duplications in therapy: none noted Gaps in therapy: ACEi or ARB for LVSD (patient has allergy listed to lisinopril - cough) Medications to avoid in the elderly: amiodarone and digoxin are not recommended as first-line therapy for atrial fibrillation in the elderly - chart reviewed and noted that patient has persistent atrial fibrillation that has been difficult to rate control and has failed Tikosyn Drug interactions: none noted Other issues noted: apixaban 5mg  BID is indicated for atrial fibrillation in 82yo patient with Cr 1.17 (12/04/15) and weight 75kg - chart reviewed and noted that patient was prescribed 2.5mg  BID dose due to hospitalization with life-threatening retroperitoneal blood on warfarin   2.  Medication adherence:  Patient reports adherence with all discharge medications.    Plan: 1.  Medication review:   - Patient is currently on metoprolol tartrate for atrial fibrillation.  Due to diagnosis of HFrEF, would recommend use of 1 of the 3 beta blockers (bisoprolol, carvedilol, and sustained-release metoprolol succinate) proven to reduce mortality in patients with HFrEF.  Please consider changing patient from metoprolol tartrate to metoprolol succinate.   - Patient has LVSD which is  compelling indication for ACE inhibitor or ARB use.  Patient has allergy listed to lisinopril (cough).  Reviewed chart and noted patient has been unable to tolerate ACEi or ARB due to low blood pressure.  Please consider initiation of ARB in the future if patient's blood pressure tolerates.   Will send a fax to patient's cardiology office with this information.  2.  Encouraged patient to continue to take his medications as prescribed.  Elisabeth Most, Pharm.D. Pharmacy Resident [place] [phone]

## 2015-12-09 NOTE — Telephone Encounter (Signed)
Ok with me 

## 2015-12-09 NOTE — Telephone Encounter (Signed)
Please advise, thanks.

## 2015-12-09 NOTE — Telephone Encounter (Signed)
Is requesting verbal order to resume OT for two times a week for 4 weeks for strengthening and endurance training.

## 2015-12-09 NOTE — Telephone Encounter (Signed)
Left message for [name]/[facility] giving OK for OT request per dr Ronnald Ramp note

## 2015-12-09 NOTE — Anesthesia Postprocedure Evaluation (Signed)
Anesthesia Post Note  Patient: [name]  Procedure(s) Performed: Procedure(s) (LRB): INSERTION RIGHT PLEURAL DRAINAGE CATHETER (Right)  Patient location during evaluation: PACU Anesthesia Type: MAC Level of consciousness: awake and alert Pain management: pain level controlled Vital Signs Assessment: post-procedure vital signs reviewed and stable Respiratory status: spontaneous breathing Cardiovascular status: blood pressure returned to baseline Anesthetic complications: no    Last Vitals:  Filed Vitals:   12/06/15 0452 12/06/15 1019  BP: 106/72 96/66  Pulse: 80 82  Temp: 36.4 C 36.4 C  Resp: 20 18    Last Pain:  Filed Vitals:   12/06/15 1134  PainSc: 0-No pain                 Tiajuana Amass

## 2015-12-10 ENCOUNTER — Telehealth: Payer: Self-pay | Admitting: Internal Medicine

## 2015-12-10 ENCOUNTER — Emergency Department (HOSPITAL_COMMUNITY): Payer: Medicare Other

## 2015-12-10 ENCOUNTER — Inpatient Hospital Stay (HOSPITAL_COMMUNITY)
Admission: EM | Admit: 2015-12-10 | Discharge: 2015-12-15 | DRG: 194 | Disposition: A | Discharge status: Skilled Nursing Facility | Payer: Medicare Other | Attending: Internal Medicine | Admitting: Internal Medicine

## 2015-12-10 ENCOUNTER — Encounter (HOSPITAL_COMMUNITY): Payer: Self-pay | Admitting: Emergency Medicine

## 2015-12-10 ENCOUNTER — Telehealth: Payer: Self-pay | Admitting: Physician Assistant

## 2015-12-10 DIAGNOSIS — I482 Chronic atrial fibrillation: Secondary | ICD-10-CM | POA: Diagnosis present

## 2015-12-10 DIAGNOSIS — Z888 Allergy status to other drugs, medicaments and biological substances status: Secondary | ICD-10-CM

## 2015-12-10 DIAGNOSIS — Z885 Allergy status to narcotic agent status: Secondary | ICD-10-CM | POA: Diagnosis not present

## 2015-12-10 DIAGNOSIS — J189 Pneumonia, unspecified organism: Principal | ICD-10-CM

## 2015-12-10 DIAGNOSIS — Z79899 Other long term (current) drug therapy: Secondary | ICD-10-CM

## 2015-12-10 DIAGNOSIS — D649 Anemia, unspecified: Secondary | ICD-10-CM | POA: Diagnosis present

## 2015-12-10 DIAGNOSIS — E118 Type 2 diabetes mellitus with unspecified complications: Secondary | ICD-10-CM | POA: Diagnosis not present

## 2015-12-10 DIAGNOSIS — Y95 Nosocomial condition: Secondary | ICD-10-CM | POA: Diagnosis present

## 2015-12-10 DIAGNOSIS — N182 Chronic kidney disease, stage 2 (mild): Secondary | ICD-10-CM | POA: Diagnosis present

## 2015-12-10 DIAGNOSIS — E1122 Type 2 diabetes mellitus with diabetic chronic kidney disease: Secondary | ICD-10-CM | POA: Diagnosis present

## 2015-12-10 DIAGNOSIS — Z8589 Personal history of malignant neoplasm of other organs and systems: Secondary | ICD-10-CM

## 2015-12-10 DIAGNOSIS — N138 Other obstructive and reflux uropathy: Secondary | ICD-10-CM | POA: Diagnosis present

## 2015-12-10 DIAGNOSIS — N401 Enlarged prostate with lower urinary tract symptoms: Secondary | ICD-10-CM | POA: Diagnosis present

## 2015-12-10 DIAGNOSIS — J90 Pleural effusion, not elsewhere classified: Secondary | ICD-10-CM | POA: Diagnosis not present

## 2015-12-10 DIAGNOSIS — Z7901 Long term (current) use of anticoagulants: Secondary | ICD-10-CM

## 2015-12-10 DIAGNOSIS — Z87891 Personal history of nicotine dependence: Secondary | ICD-10-CM

## 2015-12-10 DIAGNOSIS — Z880 Allergy status to penicillin: Secondary | ICD-10-CM

## 2015-12-10 DIAGNOSIS — I5022 Chronic systolic (congestive) heart failure: Secondary | ICD-10-CM | POA: Diagnosis present

## 2015-12-10 DIAGNOSIS — H269 Unspecified cataract: Secondary | ICD-10-CM | POA: Diagnosis present

## 2015-12-10 DIAGNOSIS — K8689 Other specified diseases of pancreas: Secondary | ICD-10-CM | POA: Diagnosis present

## 2015-12-10 DIAGNOSIS — I481 Persistent atrial fibrillation: Secondary | ICD-10-CM | POA: Diagnosis present

## 2015-12-10 DIAGNOSIS — Z9889 Other specified postprocedural states: Secondary | ICD-10-CM

## 2015-12-10 DIAGNOSIS — E038 Other specified hypothyroidism: Secondary | ICD-10-CM | POA: Diagnosis not present

## 2015-12-10 DIAGNOSIS — Z794 Long term (current) use of insulin: Secondary | ICD-10-CM | POA: Diagnosis not present

## 2015-12-10 DIAGNOSIS — K219 Gastro-esophageal reflux disease without esophagitis: Secondary | ICD-10-CM | POA: Diagnosis present

## 2015-12-10 DIAGNOSIS — I2510 Atherosclerotic heart disease of native coronary artery without angina pectoris: Secondary | ICD-10-CM | POA: Diagnosis present

## 2015-12-10 DIAGNOSIS — Z881 Allergy status to other antibiotic agents status: Secondary | ICD-10-CM

## 2015-12-10 DIAGNOSIS — I130 Hypertensive heart and chronic kidney disease with heart failure and stage 1 through stage 4 chronic kidney disease, or unspecified chronic kidney disease: Secondary | ICD-10-CM | POA: Diagnosis present

## 2015-12-10 DIAGNOSIS — E039 Hypothyroidism, unspecified: Secondary | ICD-10-CM | POA: Diagnosis present

## 2015-12-10 HISTORY — DX: Pneumonia, unspecified organism: J18.9

## 2015-12-10 LAB — URINALYSIS, ROUTINE W REFLEX MICROSCOPIC
BILIRUBIN URINE: NEGATIVE
Glucose, UA: NEGATIVE mg/dL
HGB URINE DIPSTICK: NEGATIVE
Ketones, ur: NEGATIVE mg/dL
Leukocytes, UA: NEGATIVE
Nitrite: NEGATIVE
Protein, ur: NEGATIVE mg/dL
SPECIFIC GRAVITY, URINE: 1.019 (ref 1.005–1.030)
pH: 5 (ref 5.0–8.0)

## 2015-12-10 LAB — CBC WITH DIFFERENTIAL/PLATELET
Basophils Absolute: 0 10*3/uL (ref 0.0–0.1)
Basophils Relative: 0 %
EOS ABS: 0.1 10*3/uL (ref 0.0–0.7)
EOS PCT: 1 %
HCT: 38.6 % — ABNORMAL LOW (ref 39.0–52.0)
HEMOGLOBIN: 12.9 g/dL — AB (ref 13.0–17.0)
LYMPHS ABS: 0.9 10*3/uL (ref 0.7–4.0)
Lymphocytes Relative: 10 %
MCH: 31.9 pg (ref 26.0–34.0)
MCHC: 33.4 g/dL (ref 30.0–36.0)
MCV: 95.3 fL (ref 78.0–100.0)
MONO ABS: 2 10*3/uL — AB (ref 0.1–1.0)
MONOS PCT: 23 %
Neutro Abs: 5.6 10*3/uL (ref 1.7–7.7)
Neutrophils Relative %: 66 %
PLATELETS: 159 10*3/uL (ref 150–400)
RBC: 4.05 MIL/uL — ABNORMAL LOW (ref 4.22–5.81)
RDW: 14.1 % (ref 11.5–15.5)
WBC: 8.5 10*3/uL (ref 4.0–10.5)

## 2015-12-10 LAB — BASIC METABOLIC PANEL
Anion gap: 8 (ref 5–15)
BUN: 19 mg/dL (ref 6–20)
CALCIUM: 8.2 mg/dL — AB (ref 8.9–10.3)
CHLORIDE: 99 mmol/L — AB (ref 101–111)
CO2: 27 mmol/L (ref 22–32)
CREATININE: 1.27 mg/dL — AB (ref 0.61–1.24)
GFR calc Af Amer: 56 mL/min — ABNORMAL LOW (ref >60)
GFR calc non Af Amer: 49 mL/min — ABNORMAL LOW (ref >60)
Glucose, Bld: 184 mg/dL — ABNORMAL HIGH (ref 65–99)
Potassium: 4.5 mmol/L (ref 3.5–5.1)
SODIUM: 134 mmol/L — AB (ref 135–145)

## 2015-12-10 LAB — I-STAT TROPONIN, ED: TROPONIN I, POC: 0.05 ng/mL (ref 0.00–0.08)

## 2015-12-10 LAB — PROTIME-INR
INR: 1.42 (ref 0.00–1.49)
PROTHROMBIN TIME: 17.4 s — AB (ref 11.6–15.2)

## 2015-12-10 LAB — GLUCOSE, CAPILLARY: Glucose-Capillary: 132 mg/dL — ABNORMAL HIGH (ref 65–99)

## 2015-12-10 LAB — APTT: aPTT: 40 seconds — ABNORMAL HIGH (ref 24–37)

## 2015-12-10 MED ORDER — PANCRELIPASE (LIP-PROT-AMYL) 12000-38000 UNITS PO CPEP
12000.0000 [IU] | ORAL_CAPSULE | Freq: Three times a day (TID) | ORAL | Status: DC
  Administered 2015-12-11 – 2015-12-15 (×13): 12000 [IU] via ORAL
  Filled 2015-12-10 (×13): qty 1

## 2015-12-10 MED ORDER — SODIUM CHLORIDE 0.9 % IJ SOLN: 3.0000 mL | INTRAMUSCULAR | Status: DC | PRN

## 2015-12-10 MED ORDER — FUROSEMIDE 20 MG PO TABS: 20.0000 mg | ORAL_TABLET | ORAL | Status: DC

## 2015-12-10 MED ORDER — METOPROLOL TARTRATE 25 MG PO TABS
25.0000 mg | ORAL_TABLET | Freq: Two times a day (BID) | ORAL | Status: DC
  Administered 2015-12-11 – 2015-12-14 (×6): 25 mg via ORAL
  Filled 2015-12-10 (×8): qty 1

## 2015-12-10 MED ORDER — ACETAMINOPHEN 325 MG PO TABS
650.0000 mg | ORAL_TABLET | Freq: Once | ORAL | Status: AC
  Administered 2015-12-10: 650 mg via ORAL
  Filled 2015-12-10: qty 2

## 2015-12-10 MED ORDER — DEXTROSE 5 % IV SOLN
2.0000 g | Freq: Three times a day (TID) | INTRAVENOUS | Status: DC
  Administered 2015-12-10 – 2015-12-12 (×5): 2 g via INTRAVENOUS
  Filled 2015-12-10 (×9): qty 2

## 2015-12-10 MED ORDER — AZELASTINE HCL 0.1 % NA SOLN
2.0000 | Freq: Two times a day (BID) | NASAL | Status: DC
  Administered 2015-12-12 – 2015-12-13 (×4): 2 via NASAL
  Filled 2015-12-10: qty 30

## 2015-12-10 MED ORDER — PANTOPRAZOLE SODIUM 40 MG PO TBEC
40.0000 mg | DELAYED_RELEASE_TABLET | Freq: Every day | ORAL | Status: DC
  Administered 2015-12-11 – 2015-12-15 (×5): 40 mg via ORAL
  Filled 2015-12-10 (×6): qty 1

## 2015-12-10 MED ORDER — ONDANSETRON HCL 4 MG PO TABS: 4.0000 mg | ORAL_TABLET | Freq: Four times a day (QID) | ORAL | Status: DC | PRN

## 2015-12-10 MED ORDER — FUROSEMIDE 20 MG PO TABS
20.0000 mg | ORAL_TABLET | ORAL | Status: DC
  Administered 2015-12-12 – 2015-12-14 (×2): 20 mg via ORAL
  Filled 2015-12-10 (×4): qty 1

## 2015-12-10 MED ORDER — IPRATROPIUM BROMIDE 0.02 % IN SOLN: 0.5000 mg | Freq: Four times a day (QID) | RESPIRATORY_TRACT | Status: DC | PRN

## 2015-12-10 MED ORDER — AMIODARONE HCL 200 MG PO TABS
200.0000 mg | ORAL_TABLET | Freq: Two times a day (BID) | ORAL | Status: DC
  Administered 2015-12-10 – 2015-12-15 (×10): 200 mg via ORAL
  Filled 2015-12-10 (×10): qty 1

## 2015-12-10 MED ORDER — SODIUM CHLORIDE 0.9 % IJ SOLN
3.0000 mL | Freq: Two times a day (BID) | INTRAMUSCULAR | Status: DC
  Administered 2015-12-10 – 2015-12-14 (×5): 3 mL via INTRAVENOUS

## 2015-12-10 MED ORDER — VANCOMYCIN HCL 10 G IV SOLR
1500.0000 mg | Freq: Once | INTRAVENOUS | Status: DC
  Filled 2015-12-10: qty 1500

## 2015-12-10 MED ORDER — SODIUM CHLORIDE 0.9 % IJ SOLN
3.0000 mL | Freq: Two times a day (BID) | INTRAMUSCULAR | Status: DC
  Administered 2015-12-12 – 2015-12-15 (×4): 3 mL via INTRAVENOUS

## 2015-12-10 MED ORDER — VANCOMYCIN HCL IN DEXTROSE 1-5 GM/200ML-% IV SOLN: 1000.0000 mg | Freq: Once | INTRAVENOUS | Status: DC

## 2015-12-10 MED ORDER — VITAMIN C 500 MG PO TABS
500.0000 mg | ORAL_TABLET | Freq: Every day | ORAL | Status: DC
  Administered 2015-12-11 – 2015-12-15 (×5): 500 mg via ORAL
  Filled 2015-12-10 (×6): qty 1

## 2015-12-10 MED ORDER — ONDANSETRON HCL 4 MG/2ML IJ SOLN: 4.0000 mg | Freq: Four times a day (QID) | INTRAMUSCULAR | Status: DC | PRN

## 2015-12-10 MED ORDER — LEVOTHYROXINE SODIUM 25 MCG PO TABS
25.0000 ug | ORAL_TABLET | Freq: Every day | ORAL | Status: DC
  Administered 2015-12-11 – 2015-12-15 (×5): 25 ug via ORAL
  Filled 2015-12-10 (×5): qty 1

## 2015-12-10 MED ORDER — LEVOFLOXACIN IN D5W 750 MG/150ML IV SOLN
750.0000 mg | Freq: Once | INTRAVENOUS | Status: AC
  Administered 2015-12-10: 750 mg via INTRAVENOUS
  Filled 2015-12-10: qty 150

## 2015-12-10 MED ORDER — LEVALBUTEROL HCL 0.63 MG/3ML IN NEBU: 1.2500 mg | INHALATION_SOLUTION | Freq: Four times a day (QID) | RESPIRATORY_TRACT | Status: DC | PRN

## 2015-12-10 MED ORDER — SODIUM CHLORIDE 0.9 % IV SOLN: 250.0000 mL | INTRAVENOUS | Status: DC | PRN

## 2015-12-10 MED ORDER — DIGOXIN 125 MCG PO TABS
0.1250 mg | ORAL_TABLET | ORAL | Status: DC
  Administered 2015-12-12 – 2015-12-14 (×2): 0.125 mg via ORAL
  Filled 2015-12-10 (×5): qty 1

## 2015-12-10 MED ORDER — TAMSULOSIN HCL 0.4 MG PO CAPS
0.4000 mg | ORAL_CAPSULE | Freq: Two times a day (BID) | ORAL | Status: DC
  Administered 2015-12-10 – 2015-12-15 (×10): 0.4 mg via ORAL
  Filled 2015-12-10 (×10): qty 1

## 2015-12-10 MED ORDER — FINASTERIDE 5 MG PO TABS
5.0000 mg | ORAL_TABLET | Freq: Every day | ORAL | Status: DC
  Administered 2015-12-11 – 2015-12-15 (×5): 5 mg via ORAL
  Filled 2015-12-10 (×6): qty 1

## 2015-12-10 MED ORDER — APIXABAN 2.5 MG PO TABS
2.5000 mg | ORAL_TABLET | Freq: Two times a day (BID) | ORAL | Status: DC
  Administered 2015-12-10 – 2015-12-15 (×10): 2.5 mg via ORAL
  Filled 2015-12-10 (×10): qty 1

## 2015-12-10 MED ORDER — LEVOFLOXACIN IN D5W 750 MG/150ML IV SOLN
750.0000 mg | INTRAVENOUS | Status: DC
  Administered 2015-12-11 – 2015-12-14 (×4): 750 mg via INTRAVENOUS
  Filled 2015-12-10 (×5): qty 150

## 2015-12-10 NOTE — H&P (Signed)
[facility] History and Physical  [name] [id number] DOB: 1925/10/17 DOA: 12/10/2015  Referring physician: Nona Dell, PA-C PCP: [name], MD   Chief Complaint: Shortness of breath.  HPI: [name] is a 82 y.o. male with a past medical history of atrial fibrillation, CAD, chronic systolic CHF, hypertension, type 2 diabetes who returns to the hospital for 4 days after being discharged due to recurrent pleural effusion on the right, for which he got a thoracentesis and insertion of a right Pleurx catheter tissue to worsening of chronic dyspnea and fever of 100.85F.  Per patient, he started feeling worsening of dyspnea and fatigue this morning. He states that he was evaluated by his home health nurse, who also noticed worsening of his dyspnea and fever. He states that he was feeling well and that 2 days ago 750 mL of pleural fluid were drained by the home health nurse from his thoracic catheter with improvement of his dyspnea. However, after he woke up this morning,  he started feeling worse.    In the ER, workup revealed a new RML-RLL infiltrate and reaccumulation of fluid in his right base. He was started on supplemental oxygen and IV antibiotics. He states that he feels better with treatment.   Review of Systems:  Constitutional:  Positive for Fevers, chills, fatigue. No weight loss, night sweats   HEENT:  No headaches, Difficulty swallowing,Tooth/dental problems,Sore throat,  No sneezing, itching, ear ache, nasal congestion, post nasal drip,  Cardio-vascular:  Positive swelling in lower extremities. No chest pain, Orthopnea, PND, anasarca, dizziness, palpitations  GI:  Positive loss of appetite.  No heartburn, indigestion, abdominal pain, nausea, vomiting, diarrhea, change in bowel habits, Resp:  Positive dyspnea, positive dry cough, no wheezing, no hemoptysis. Skin:  no rash or lesions.  GU:  no dysuria, change in color of urine, no  urgency or frequency. No flank pain.  Musculoskeletal:  No joint pain or swelling. No decreased range of motion. No back pain.  Psych:  No change in mood or affect. No depression or anxiety. No memory loss.   Past Medical History  Diagnosis Date  . Acid reflux disease     history of  . H/O: GI bleed   . Persistent atrial fibrillation ([place])     a. Failed tikosyn. b. DCCV did not hold 07/2015.  Marland Kitchen Benign prostatic hypertrophy     history of  . Hepatic damage march 2009    retained hepatic stone , intrahepatic duct catheter  . CAD (coronary artery disease)     a. R/LHC 11/09/15: mild nonobstructive CAD ([facility]) with well- compensated hemodynamics with low filling pressures.  . Hypertension   . 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 Seizures ([place])   . Ventricular tachycardia ? Tikosyn proarrhtyhmia   . Cataracts, bilateral   . Diabetes mellitus     insulin dep  . Pleural effusion 10/2015  . Acute on chronic systolic CHF (congestive heart failure) ([place])   . CKD (chronic kidney disease), stage II    Past Surgical History  Procedure Laterality Date  . Cardioversion    . Cholecystectomy      history of  . Partial gastrectomy      history of  . Shoulder surgery      left shoulder repair with 2 pens inserted  .  Whipple procedure      operation  . Sarcoma removal from rigth tricep    . Inguinal hernia repair      inguinal herniorrhaphy, right... history of  . Appendectomy      history of  . Cardioversion  03/07/2012    Procedure: CARDIOVERSION;  Surgeon: Deboraha Sprang, MD;  Location: [place];  Service: Cardiovascular;  Laterality: N/A;  . Cardioversion N/A 08/10/2015    Procedure: CARDIOVERSION;  Surgeon: Thayer Headings, MD;  Location: [facility];  Service: Cardiovascular;  Laterality: N/A;  . Cardiac catheterization N/A 11/09/2015     Procedure: Right/Left Heart Cath and Coronary Angiography;  Surgeon: Jolaine Artist, MD;  Location: [place] [facility];  Service: Cardiovascular;  Laterality: N/A;  . Chest tube insertion Right 12/04/2015    Procedure: INSERTION RIGHT PLEURAL DRAINAGE CATHETER;  Surgeon: Gaye Pollack, MD;  Location: [place] OR;  Service: Thoracic;  Laterality: Right;   Social History:  reports that he quit smoking about 24 years ago. His smoking use included Pipe. He has never used smokeless tobacco. He reports that he does not drink alcohol or use illicit drugs.  Allergies  Allergen Reactions  . Lisinopril Cough  . Toujeo Solostar [Insulin Glargine] Other (See Comments)    Shaking, felt bad all over  . Clindamycin Diarrhea  . Other Other (See Comments)    toujeo with perioral tingling only - pt refuses to take further  . Oxycodone-Acetaminophen Diarrhea  . Penicillins Hives    Has patient had a PCN reaction causing immediate rash, facial/tongue/throat swelling, SOB or lightheadedness with hypotension: Yes Has patient had a PCN reaction causing severe rash involving mucus membranes or skin necrosis: no  Has patient had a PCN reaction that required hospitalization No Has patient had a PCN reaction occurring within the last 10 years: No If all of the above answers are "NO", then may proceed with Cephalosporin use.    Family History  Problem Relation Age of Onset  . Coronary artery disease Father 35  . Heart disease Father     fatal MI  . Osteoarthritis Mother 62  . Arthritis Mother   . Coronary artery disease Brother     cabg, avr, pvd with sents  . Heart disease Brother     CABG, PVD  . Peripheral vascular disease Sister 38  . Fibromyalgia Sister   . Arthritis Sister   . Coronary artery disease Sister   . Diabetes Sister   . Cancer Brother     Prior to Admission medications   Medication Sig Start Date End Date Taking? Authorizing Provider  amiodarone (PACERONE) 200 MG tablet Take 1 tablet  (200 mg total) by mouth 2 (two) times daily. 11/27/15  Yes Renee Dyane Dustman, PA-C  apixaban (ELIQUIS) 2.5 MG TABS tablet Take 1 tablet (2.5 mg total) by mouth 2 (two) times daily. 11/10/15  Yes Reyne Dumas, MD  Ascorbic Acid (VITAMIN C) 500 MG tablet Take 500 mg by mouth daily.    Yes Historical Provider, MD  azelastine (ASTELIN) 0.1 % nasal spray Place 2 sprays into both nostrils 2 (two) times daily. Use in each nostril as directed 10/21/15  Yes Janith Lima, MD  [facility] GLUCOSAMINE-CHONDROITIN PO Take 1 tablet by mouth daily. 1200/600 mg   Yes Historical Provider, MD  digoxin (LANOXIN) 0.125 MG tablet Take 1 tablet (0.125 mg total) by mouth every other day. 11/27/15  Yes Renee Dyane Dustman, PA-C  finasteride (PROSCAR) 5 MG tablet Take 1 tablet (  5 mg total) by mouth daily. 06/10/15  Yes Janith Lima, MD  furosemide (LASIX) 20 MG tablet Take 1 tablet (20 mg total) by mouth every other day. 11/11/15  Yes Reyne Dumas, MD  insulin aspart (NOVOLOG FLEXPEN) 100 UNIT/ML FlexPen Inject 4-8 Units into the skin 3 (three) times daily with meals. Inject 6 units subcutaneously with breakfast, 3-4 units with lunch and 5-6 units with supper   Yes Historical Provider, MD  Insulin Pen Needle (B-D UF III MINI PEN NEEDLES) 31G X 5 MM MISC Inject 120 Devices into the skin 4 (four) times daily. Use a new needle for injecting insulin4 times a day 02/25/15  Yes Janith Lima, MD  levothyroxine (SYNTHROID, LEVOTHROID) 25 MCG tablet Take 1 tablet (25 mcg total) by mouth daily before breakfast. 11/27/15  Yes Renee Dyane Dustman, PA-C  lipase/protease/amylase (CREON) 12000 UNITS CPEP capsule Take 1 capsule (12,000 Units total) by mouth 3 (three) times daily before meals. 10/27/14  Yes Janith Lima, MD  metoprolol tartrate (LOPRESSOR) 25 MG tablet Take 1 tablet (25 mg total) by mouth 2 (two) times daily. 12/01/15  Yes Renee Dyane Dustman, PA-C  Multiple Vitamins-Minerals (CENTRUM SILVER PO) Take 1 tablet by mouth daily.    Yes  Historical Provider, MD  Probiotic Product (PROBIOTIC DAILY PO) Take 3 tablets by mouth daily. 3 tabs once a day with meals.   Yes Historical Provider, MD  tamsulosin (FLOMAX) 0.4 MG CAPS capsule Take 1 capsule (0.4 mg total) by mouth 2 (two) times daily. 06/10/15  Yes Janith Lima, MD  Suvorexant (BELSOMRA) 10 MG TABS Take 1 tablet by mouth at bedtime as needed. Patient not taking: Reported on 12/02/2015 12/01/15   Janith Lima, MD  traMADol (ULTRAM) 50 MG tablet Take 1 tablet (50 mg total) by mouth every 6 (six) hours as needed for moderate pain. Patient not taking: Reported on 12/09/2015 12/06/15   Ripudeep Krystal Eaton, MD   Physical Exam: Filed Vitals:   12/10/15 1815 12/10/15 1845 12/10/15 1900 12/10/15 2016  BP: 98/83 112/60 107/65 91/52  Pulse: 98 102 98 103  Temp:      TempSrc:      Resp:    18  SpO2: 99% 98% 98% 92%    Wt Readings from Last 3 Encounters:  12/06/15 75.841 kg (167 lb 3.2 oz)  12/01/15 83.462 kg (184 lb)  11/27/15 79.379 kg (175 lb)    General:  Appears calm and comfortable Eyes: PERRL, normal lids, irises & conjunctiva ENT: grossly normal hearing, nasal canula in place, lips normal, oral mucosa is moist. Neck: no LAD, masses or thyromegaly Cardiovascular: Irregularly irregular, no m/r/g. 1+ LE edema. Telemetry: Atrial fibrillation at 96 bpm.  Respiratory: Decreased breath sounds on right base, no wheezing or ronchi. Chest: Right Pleurx catheter present.  Abdomen: soft, ntnd,  Skin: no rash or induration seen on limited exam Musculoskeletal: grossly normal tone BUE/BLE Psychiatric: grossly normal mood and affect, speech fluent and appropriate Neurologic: AAOx3, grossly non-focal.          Labs on Admission:  Basic Metabolic Panel:  Recent Labs Lab 12/04/15 0330 12/10/15 1730  NA 137 134*  K 4.2 4.5  CL 106 99*  CO2 23 27  GLUCOSE 107* 184*  BUN 15 19  CREATININE 1.17 1.27*  CALCIUM 8.3* 8.2*   CBC:  Recent Labs Lab 12/04/15 0330 12/10/15 1730   WBC 5.4 8.5  NEUTROABS  --  5.6  HGB 11.7* 12.9*  HCT 36.0* 38.6*  MCV 97.3 95.3  PLT 133* 159    BNP (last 3 results)  Recent Labs  11/04/15 1312  BNP 469.1*    ProBNP (last 3 results)  Recent Labs  11/04/15 1100  PROBNP 504.0*    CBG:  Recent Labs Lab 12/05/15 1610 12/05/15 2141 12/06/15 0600 12/06/15 1132 12/06/15 1140  GLUCAP 158* 72 129* 115* 122*    Radiological Exams on Admission: Dg Chest 2 View  12/10/2015  CLINICAL DATA:  Shortness of breath.  Fever.  Weakness. EXAM: CHEST  2 VIEW COMPARISON:  12/04/2015 chest radiograph. FINDINGS: Catheter fragment appears stable in position in the mediastinum and anterior mid to lower right lung. Right basilar chest tube is stable. Stable cardiomediastinal silhouette with mild cardiomegaly. No pneumothorax. There is interval reaccumulation of a small posterior basilar right pleural effusion. No left pleural effusion. There is new patchy opacity in the mid to lower right lung. There is stable mild patchy opacity in the medial left lung base. No overt pulmonary edema. IMPRESSION: 1. Interval reaccumulation of a small posterior basilar right pleural effusion despite stable position of the basilar right chest tube. No pneumothorax. 2. New patchy opacity in the mid to lower right lung, suspicious for new pneumonia. 3. Stable mild patchy opacity in the medial left lung base, favor atelectasis. 4. Stable mild cardiomegaly without overt pulmonary edema. Electronically Signed   By: Ilona Sorrel M.D.   On: 12/10/2015 17:25    EKG: Independently reviewed. Vent. rate 104 BPM PR interval * ms QRS duration 93 ms QT/QTc 367/483 ms P-R-T axes -1 -66 70 Atrial fibrillation Inferior infarct, old Anterior infarct, old Lateral leads are also involved  Assessment/Plan Principal Problem:   HCAP (healthcare-associated pneumonia) Admit to inpatient. Continue supplemental oxygen. Get blood cultures and sensitivity 2. Check lactic acid  level. Nosocomial pneumonia IV antibiotic coverage.  Active Problems:   S/P thoracentesis   Recurrent pleural effusion on right Continue pleural fluid drainage through Pleurx catheter as needed.    Type II diabetes mellitus with manifestations (HCC) Carbohydrate modified diet. CBG monitoring before meals. TURP or use of insulin if needed, since the patient has a history of significant side effects with Lantus use.    CAD (coronary artery disease) Continue metoprolol 25 mg by mouth twice a day. Continue Eliquis 2.5 mg by mouth twice a day    Atrial fibrillation CHA2DS2-VASc Score was 6 points. Continue metoprolol 25 mg PO BID and amiodarone 200 mg PO BID for rate control. Continue Apixaban 2.5 mg by mouth twice a day.    Chronic systolic CHF (congestive heart failure) (HCC) Continue on Lanoxin, furosemide and beta blocker. Check digoxin level.    Hypothyroid Continue levothyroxine and check TSH.    Pancreatic insufficiency (HCC) Continue pancreatic enzymes supplementation.        Code Status: Full code. DVT Prophylaxis: On Long term anticoagulation. Family Communication:  Disposition Plan: Admit to telemetry for IV antibiotic therapy.  Time spent: Over 70 minutes were spent during the process of this admission.   Reubin Milan [facility] Pager [phone]

## 2015-12-10 NOTE — Telephone Encounter (Signed)
Patient is having a temp of 100.0.  Heart rate elevated to 104 at rest and having decreased urinary output.  Also, having chest pain on right side.  Would like to know what Dr. Ronnald Ramp advises to do.   I explained that Dr. Ronnald Ramp is out of the office until next week.  I advised home health that patient needed to go to ER.

## 2015-12-10 NOTE — ED Notes (Signed)
Pt reports that is home health RN came today and found him SOB w/ a fever.  He had fluid drained from his Right lung 2 days ago and since then has experienced SOB.  Tube is still in place.  Pt was 93-94% on RA however once placed on 4L of O2 for comfort 95%.  CBG 202, 110/58, pulse 100.

## 2015-12-10 NOTE — Telephone Encounter (Signed)
Called and spoke to pt wife. EMS was currently there. Pt will cal with any further questions or concerns

## 2015-12-10 NOTE — ED Notes (Signed)
Attempted to give report 

## 2015-12-10 NOTE — ED Provider Notes (Signed)
CSN: [id number]     Arrival date & time 12/10/15  1551 History   First MD Initiated Contact with Patient 12/10/15 1604     Chief Complaint  Patient presents with  . Shortness of Breath     (Consider location/radiation/quality/duration/timing/severity/associated sxs/prior Treatment) HPI  Patient is a 82 year old male with past medical history of CHF, A. Fib (on Eliquis), DM and chronic anemia who presents the ED with complaint of shortness of breath. Patient states he was recently admitted on 12/02/15 for right pleural effusion and had a thoracentesis performed and was d/c home on 12/06/15. Pt reports he has had a right pleural effusion multiple times since December.  Patient states he has had ongoing shortness of breath with exertion since having initial onset a pleural effusion. Endorses associated dry cough. He also states he started feeling fatigued this morning and had a fever at home. He notes he has also had mild discomfort superior to his drain. Denies redness, swelling, or drainag surrounding his drain. Endorses chronic bilateral lower extremity edema she notes is slightly worse. Pt reports his home nurse came 2 days ago to drain fluid, pt reports draining 761mL of fluid. Denies headache, chills, lightheadedness, dizziness, visual changes, wheezing, palpitations, abdominal pain, N/V/D, urinary sxs, numbness, tingling, weakness.  Past Medical History  Diagnosis Date  . Acid reflux disease     history of  . H/O: GI bleed   . Persistent atrial fibrillation ([place])     a. Failed tikosyn. b. DCCV did not hold 07/2015.  Marland Kitchen Benign prostatic hypertrophy     history of  . Hepatic damage march 2009    retained hepatic stone , intrahepatic duct catheter  . CAD (coronary artery disease)     a. R/LHC 11/09/15: mild nonobstructive CAD (NICM) with well- compensated hemodynamics with low filling pressures.  . Hypertension   . 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 Seizures ([place])   . Ventricular tachycardia ? Tikosyn proarrhtyhmia   . Cataracts, bilateral   . Diabetes mellitus     insulin dep  . Pleural effusion 10/2015  . Acute on chronic systolic CHF (congestive heart failure) ([place])   . CKD (chronic kidney disease), stage II    Past Surgical History  Procedure Laterality Date  . Cardioversion    . Cholecystectomy      history of  . Partial gastrectomy      history of  . Shoulder surgery      left shoulder repair with 2 pens inserted  . Whipple procedure      operation  . Sarcoma removal from rigth tricep    . Inguinal hernia repair      inguinal herniorrhaphy, right... history of  . Appendectomy      history of  . Cardioversion  03/07/2012    Procedure: CARDIOVERSION;  Surgeon: Deboraha Sprang, MD;  Location: [place];  Service: Cardiovascular;  Laterality: N/A;  . Cardioversion N/A 08/10/2015    Procedure: CARDIOVERSION;  Surgeon: Thayer Headings, MD;  Location: [facility] ENDOSCOPY;  Service: Cardiovascular;  Laterality: N/A;  . Cardiac catheterization N/A 11/09/2015    Procedure: Right/Left Heart Cath and Coronary Angiography;  Surgeon: Jolaine Artist, MD;  Location: [facility] LAB;  Service: Cardiovascular;  Laterality: N/A;  . Chest tube insertion Right 12/04/2015  Procedure: INSERTION RIGHT PLEURAL DRAINAGE CATHETER;  Surgeon: Gaye Pollack, MD;  Location: [facility] OR;  Service: Thoracic;  Laterality: Right;   Family History  Problem Relation Age of Onset  . Coronary artery disease Father 62  . Heart disease Father     fatal MI  . Osteoarthritis Mother 76  . Arthritis Mother   . Coronary artery disease Brother     cabg, avr, pvd with sents  . Heart disease Brother     CABG, PVD  . Peripheral vascular disease Sister 73  . Fibromyalgia Sister   . Arthritis Sister   . Coronary artery disease Sister   . Diabetes Sister   .  Cancer Brother    Social History  Substance Use Topics  . Smoking status: Former Smoker    Types: Pipe    Quit date: 03/14/1991  . Smokeless tobacco: Never Used  . Alcohol Use: No    Review of Systems  Constitutional: Positive for fever and fatigue.  Respiratory: Positive for cough and shortness of breath.   Cardiovascular: Positive for leg swelling.      Allergies  Lisinopril; Toujeo solostar; Clindamycin; Other; Oxycodone-acetaminophen; and Penicillins  Home Medications   Prior to Admission medications   Medication Sig Start Date End Date Taking? Authorizing Provider  amiodarone (PACERONE) 200 MG tablet Take 1 tablet (200 mg total) by mouth 2 (two) times daily. 11/27/15  Yes Renee Dyane Dustman, PA-C  apixaban (ELIQUIS) 2.5 MG TABS tablet Take 1 tablet (2.5 mg total) by mouth 2 (two) times daily. 11/10/15  Yes Reyne Dumas, MD  Ascorbic Acid (VITAMIN C) 500 MG tablet Take 500 mg by mouth daily.    Yes Historical Provider, MD  azelastine (ASTELIN) 0.1 % nasal spray Place 2 sprays into both nostrils 2 (two) times daily. Use in each nostril as directed 10/21/15  Yes Janith Lima, MD  [facility] GLUCOSAMINE-CHONDROITIN PO Take 1 tablet by mouth daily. 1200/600 mg   Yes Historical Provider, MD  digoxin (LANOXIN) 0.125 MG tablet Take 1 tablet (0.125 mg total) by mouth every other day. 11/27/15  Yes Renee Dyane Dustman, PA-C  finasteride (PROSCAR) 5 MG tablet Take 1 tablet (5 mg total) by mouth daily. 06/10/15  Yes Janith Lima, MD  furosemide (LASIX) 20 MG tablet Take 1 tablet (20 mg total) by mouth every other day. 11/11/15  Yes Reyne Dumas, MD  insulin aspart (NOVOLOG FLEXPEN) 100 UNIT/ML FlexPen Inject 4-8 Units into the skin 3 (three) times daily with meals. Inject 6 units subcutaneously with breakfast, 3-4 units with lunch and 5-6 units with supper   Yes Historical Provider, MD  Insulin Pen Needle (B-D UF III MINI PEN NEEDLES) 31G X 5 MM MISC Inject 120 Devices into the skin 4 (four) times  daily. Use a new needle for injecting insulin4 times a day 02/25/15  Yes Janith Lima, MD  levothyroxine (SYNTHROID, LEVOTHROID) 25 MCG tablet Take 1 tablet (25 mcg total) by mouth daily before breakfast. 11/27/15  Yes Renee Dyane Dustman, PA-C  lipase/protease/amylase (CREON) 12000 UNITS CPEP capsule Take 1 capsule (12,000 Units total) by mouth 3 (three) times daily before meals. 10/27/14  Yes Janith Lima, MD  metoprolol tartrate (LOPRESSOR) 25 MG tablet Take 1 tablet (25 mg total) by mouth 2 (two) times daily. 12/01/15  Yes Renee Dyane Dustman, PA-C  Multiple Vitamins-Minerals (CENTRUM SILVER PO) Take 1 tablet by mouth daily.    Yes Historical Provider, MD  Probiotic Product (PROBIOTIC DAILY PO) Take 3 tablets  by mouth daily. 3 tabs once a day with meals.   Yes Historical Provider, MD  tamsulosin (FLOMAX) 0.4 MG CAPS capsule Take 1 capsule (0.4 mg total) by mouth 2 (two) times daily. 06/10/15  Yes Janith Lima, MD  Suvorexant (BELSOMRA) 10 MG TABS Take 1 tablet by mouth at bedtime as needed. Patient not taking: Reported on 12/02/2015 12/01/15   Janith Lima, MD  traMADol (ULTRAM) 50 MG tablet Take 1 tablet (50 mg total) by mouth every 6 (six) hours as needed for moderate pain. Patient not taking: Reported on 12/09/2015 12/06/15   Ripudeep K Rai, MD   BP 109/56 mmHg  Pulse 95  Temp(Src) 100.1 F (37.8 C) (Rectal)  Resp 16  SpO2 92% Physical Exam  Constitutional: He is oriented to person, place, and time. He appears well-developed and well-nourished. No distress.  HENT:  Head: Normocephalic and atraumatic.  Mouth/Throat: Oropharynx is clear and moist. No oropharyngeal exudate.  Eyes: Conjunctivae and EOM are normal. Right eye exhibits no discharge. Left eye exhibits no discharge. No scleral icterus.  Neck: Normal range of motion. Neck supple.  Cardiovascular: Normal rate, regular rhythm, normal heart sounds and intact distal pulses.   Pulmonary/Chest: Effort normal. No respiratory distress. He has  no wheezes. He has no rales. He exhibits no tenderness.  Decreased breath sounds at right base. Pleurx catheter present.   Abdominal: Soft. Bowel sounds are normal. He exhibits no distension and no mass. There is no tenderness. There is no rebound and no guarding.  Musculoskeletal: Normal range of motion.  1+ pitting edema to BLE.  2+ PT pulses.  Lymphadenopathy:    He has no cervical adenopathy.  Neurological: He is alert and oriented to person, place, and time.  Skin: Skin is warm and dry. He is not diaphoretic.  Nursing note and vitals reviewed.   ED Course  Procedures (including critical care time) Labs Review Labs Reviewed  CBC WITH DIFFERENTIAL/PLATELET - Abnormal; Notable for the following:    RBC 4.05 (*)    Hemoglobin 12.9 (*)    HCT 38.6 (*)    Monocytes Absolute 2.0 (*)    All other components within normal limits  BASIC METABOLIC PANEL - Abnormal; Notable for the following:    Sodium 134 (*)    Chloride 99 (*)    Glucose, Bld 184 (*)    Creatinine, Ser 1.27 (*)    Calcium 8.2 (*)    GFR calc non Af Amer 49 (*)    GFR calc Af Amer 56 (*)    All other components within normal limits  URINALYSIS, ROUTINE W REFLEX MICROSCOPIC (NOT AT [facility])  Randolm Idol, ED    Imaging Review Dg Chest 2 View  12/10/2015  CLINICAL DATA:  Shortness of breath.  Fever.  Weakness. EXAM: CHEST  2 VIEW COMPARISON:  12/04/2015 chest radiograph. FINDINGS: Catheter fragment appears stable in position in the mediastinum and anterior mid to lower right lung. Right basilar chest tube is stable. Stable cardiomediastinal silhouette with mild cardiomegaly. No pneumothorax. There is interval reaccumulation of a small posterior basilar right pleural effusion. No left pleural effusion. There is new patchy opacity in the mid to lower right lung. There is stable mild patchy opacity in the medial left lung base. No overt pulmonary edema. IMPRESSION: 1. Interval reaccumulation of a small posterior basilar  right pleural effusion despite stable position of the basilar right chest tube. No pneumothorax. 2. New patchy opacity in the mid to lower right lung, suspicious  for new pneumonia. 3. Stable mild patchy opacity in the medial left lung base, favor atelectasis. 4. Stable mild cardiomegaly without overt pulmonary edema. Electronically Signed   By: Ilona Sorrel M.D.   On: 12/10/2015 17:25   I have personally reviewed and evaluated these images and lab results as part of my medical decision-making.   EKG Interpretation   Date/Time:  Thursday December 10 2015 15:54:17 EST Ventricular Rate:  104 PR Interval:    QRS Duration: 93 QT Interval:  367 QTC Calculation: 483 R Axis:   -66 Text Interpretation:  Atrial fibrillation Inferior infarct, old Anterior  infarct, old Lateral leads are also involved no acute ischemia No  significant change since last tracing Confirmed by Gerald Leitz  [phone]) on 12/10/2015 5:02:28 PM     Filed Vitals:   12/10/15 1601  BP: 109/56  Pulse: 95  Temp: 100.1 F (37.8 C)  Resp: 16     MDM   Final diagnoses:  HAP (hospital-acquired pneumonia)    Patient presents with shortness of breath, fever and fatigue. History of recurrent right pleural effusion over the past month requiring multiple admissions. He was discharged on 12/05/15 s/p thoracentesis for right pleural effusion. Temp 100.1, HR 95. O2 92% on RA, pt placed on 2L [place]. Exam revealed decreased breath sounds at right base, Pleurx catheter present without signs of infection or drainage. 1+ pitting edema to bilateral lower extremities.  EKG showed atrial fibrillation with no significant changes from prior. This x-ray revealed interval reaccumulation of small posterior basilar right pleural effusion despite stable position of the basilar right chest tube, no pneumothorax, new patchy opacity and right mid to lower lung suspicious for new pneumonia. Patient started on IV antibiotics in the ED for HAP. Consult at  hospitalists. Dr. Olevia Bowens agrees to admission, orders placed for telemetry bed. Discussed results and plan for admission with patient.    Chesley Noon [place], [place] 12/10/15 [place], MD 12/11/15 [phone]

## 2015-12-10 NOTE — Telephone Encounter (Signed)
New message     Calling to let the doctor know about a possible drug interaction between the pacerone 200mg  and lanoxin 0.125.  Do you still want to have these prescriptions filled?

## 2015-12-10 NOTE — Progress Notes (Signed)
ANTIBIOTIC CONSULT NOTE - INITIAL  Pharmacy Consult for Aztreonam Indication: HAP  Allergies  Allergen Reactions  . Lisinopril Cough  . Toujeo Solostar [Insulin Glargine] Other (See Comments)    Shaking, felt bad all over  . Clindamycin Diarrhea  . Other Other (See Comments)    toujeo with perioral tingling only - pt refuses to take further  . Oxycodone-Acetaminophen Diarrhea  . Penicillins Hives    Has patient had a PCN reaction causing immediate rash, facial/tongue/throat swelling, SOB or lightheadedness with hypotension: Yes Has patient had a PCN reaction causing severe rash involving mucus membranes or skin necrosis: no  Has patient had a PCN reaction that required hospitalization No Has patient had a PCN reaction occurring within the last 10 years: No If all of the above answers are "NO", then may proceed with Cephalosporin use.    Patient Measurements: Total Body Weight: 75.8kg Ideal Body Weight: 84.5 kg  Vital Signs: Temp: 100.1 F (37.8 C) (01/12 1601) Temp Source: Rectal (01/12 1601) BP: 109/56 mmHg (01/12 1601) Pulse Rate: 95 (01/12 1601) Intake/Output from previous day:   Intake/Output from this shift:    Labs:  Recent Labs  12/10/15 1730  WBC 8.5  HGB 12.9*  PLT 159   Estimated Creatinine Clearance: 46.8 mL/min (by C-G formula based on Cr of 1.17). No results for input(s): VANCOTROUGH, VANCOPEAK, VANCORANDOM, GENTTROUGH, GENTPEAK, GENTRANDOM, TOBRATROUGH, TOBRAPEAK, TOBRARND, AMIKACINPEAK, AMIKACINTROU, AMIKACIN in the last 72 hours.   Microbiology: No results found for this or any previous visit (from the past 720 hour(s)).  Medical History: Past Medical History  Diagnosis Date  . Acid reflux disease     history of  . H/O: GI bleed   . Persistent atrial fibrillation ([place])     a. Failed tikosyn. b. DCCV did not hold 07/2015.  Marland Kitchen Benign prostatic hypertrophy     history of  . Hepatic damage march 2009    retained hepatic stone , intrahepatic  duct catheter  . CAD (coronary artery disease)     a. R/LHC 11/09/15: mild nonobstructive CAD (NICM) with well- compensated hemodynamics with low filling pressures.  . Hypertension   . 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 Seizures ([place])   . Ventricular tachycardia ? Tikosyn proarrhtyhmia   . Cataracts, bilateral   . Diabetes mellitus     insulin dep  . Pleural effusion 10/2015  . Acute on chronic systolic CHF (congestive heart failure) ([place])   . CKD (chronic kidney disease), stage II     Medications:   (Not in a hospital admission) Scheduled:  . acetaminophen  650 mg Oral Once   Infusions:  . levofloxacin (LEVAQUIN) IV    . vancomycin     Assessment: 82yo M w/ hx of chronic systolic HF, Afib, hypothyroidism, DM, chronic anemia. Admitted in December 2016 for PE and has had thoracentesis twice. Presented w/ recurrence of PE. Pharmacy consulted to dose Aztreonam for HAP. Temp 100.1, WBC 8.5, sCr 1.17, CrCl 46.8, no cx.  Pt has already received Vancomycin 1500mg  IV x1 and Levaquin 750mg  IV x1 in ED.  1/12>>Levaquin (x1) 1/12>>Vancomycin (x1) 1/12>>Aztreonam>>  Goal of Therapy:  Eradication of infection   Plan:  Aztreonam 2g IV q8 Measure antibiotic drug levels at steady state Follow up culture results  Continue Vanc/Levo?  Georgiann Mohs, PharmD Student

## 2015-12-11 ENCOUNTER — Encounter: Payer: Self-pay | Admitting: Pharmacist

## 2015-12-11 DIAGNOSIS — J90 Pleural effusion, not elsewhere classified: Secondary | ICD-10-CM

## 2015-12-11 DIAGNOSIS — I5022 Chronic systolic (congestive) heart failure: Secondary | ICD-10-CM

## 2015-12-11 DIAGNOSIS — E118 Type 2 diabetes mellitus with unspecified complications: Secondary | ICD-10-CM

## 2015-12-11 DIAGNOSIS — J189 Pneumonia, unspecified organism: Principal | ICD-10-CM

## 2015-12-11 DIAGNOSIS — E038 Other specified hypothyroidism: Secondary | ICD-10-CM

## 2015-12-11 DIAGNOSIS — I2510 Atherosclerotic heart disease of native coronary artery without angina pectoris: Secondary | ICD-10-CM

## 2015-12-11 LAB — COMPREHENSIVE METABOLIC PANEL
ALBUMIN: 2 g/dL — AB (ref 3.5–5.0)
ALT: 20 U/L (ref 17–63)
AST: 25 U/L (ref 15–41)
Alkaline Phosphatase: 109 U/L (ref 38–126)
Anion gap: 4 — ABNORMAL LOW (ref 5–15)
BILIRUBIN TOTAL: 1 mg/dL (ref 0.3–1.2)
BUN: 16 mg/dL (ref 6–20)
CHLORIDE: 102 mmol/L (ref 101–111)
CO2: 28 mmol/L (ref 22–32)
Calcium: 7.9 mg/dL — ABNORMAL LOW (ref 8.9–10.3)
Creatinine, Ser: 1.1 mg/dL (ref 0.61–1.24)
GFR calc Af Amer: >60 mL/min (ref >60)
GFR calc non Af Amer: 58 mL/min — ABNORMAL LOW (ref >60)
GLUCOSE: 148 mg/dL — AB (ref 65–99)
POTASSIUM: 4.1 mmol/L (ref 3.5–5.1)
SODIUM: 134 mmol/L — AB (ref 135–145)
TOTAL PROTEIN: 4.9 g/dL — AB (ref 6.5–8.1)

## 2015-12-11 LAB — GLUCOSE, CAPILLARY
GLUCOSE-CAPILLARY: 199 mg/dL — AB (ref 65–99)
Glucose-Capillary: 132 mg/dL — ABNORMAL HIGH (ref 65–99)

## 2015-12-11 LAB — LEGIONELLA ANTIGEN, URINE

## 2015-12-11 LAB — CBC WITH DIFFERENTIAL/PLATELET
BASOS ABS: 0 10*3/uL (ref 0.0–0.1)
BASOS PCT: 0 %
EOS ABS: 0.2 10*3/uL (ref 0.0–0.7)
Eosinophils Relative: 3 %
HEMATOCRIT: 37.1 % — AB (ref 39.0–52.0)
HEMOGLOBIN: 12.3 g/dL — AB (ref 13.0–17.0)
Lymphocytes Relative: 11 %
Lymphs Abs: 0.7 10*3/uL (ref 0.7–4.0)
MCH: 31.9 pg (ref 26.0–34.0)
MCHC: 33.2 g/dL (ref 30.0–36.0)
MCV: 96.1 fL (ref 78.0–100.0)
MONO ABS: 1.5 10*3/uL — AB (ref 0.1–1.0)
Monocytes Relative: 22 %
NEUTROS ABS: 4.2 10*3/uL (ref 1.7–7.7)
NEUTROS PCT: 64 %
Platelets: 145 10*3/uL — ABNORMAL LOW (ref 150–400)
RBC: 3.86 MIL/uL — ABNORMAL LOW (ref 4.22–5.81)
RDW: 14.1 % (ref 11.5–15.5)
WBC: 6.7 10*3/uL (ref 4.0–10.5)

## 2015-12-11 LAB — PHOSPHORUS: Phosphorus: 2.5 mg/dL (ref 2.5–4.6)

## 2015-12-11 LAB — LACTIC ACID, PLASMA
LACTIC ACID, VENOUS: 0.9 mmol/L (ref 0.5–2.0)
Lactic Acid, Venous: 0.8 mmol/L (ref 0.5–2.0)

## 2015-12-11 LAB — PROCALCITONIN: Procalcitonin: 0.17 ng/mL

## 2015-12-11 LAB — STREP PNEUMONIAE URINARY ANTIGEN: STREP PNEUMO URINARY ANTIGEN: NEGATIVE

## 2015-12-11 LAB — MAGNESIUM: Magnesium: 1.7 mg/dL (ref 1.7–2.4)

## 2015-12-11 MED ORDER — INSULIN ASPART 100 UNIT/ML ~~LOC~~ SOLN
0.0000 [IU] | Freq: Three times a day (TID) | SUBCUTANEOUS | Status: DC
  Administered 2015-12-12 – 2015-12-13 (×3): 2 [IU] via SUBCUTANEOUS
  Administered 2015-12-13 – 2015-12-14 (×2): 1 [IU] via SUBCUTANEOUS
  Administered 2015-12-14 – 2015-12-15 (×2): 2 [IU] via SUBCUTANEOUS
  Administered 2015-12-15: 1 [IU] via SUBCUTANEOUS

## 2015-12-11 NOTE — Progress Notes (Signed)
Called by 5W RN to drain Pleurex per MD order. Drained 900cc serous fluid. Dressing changed, cap applied. Pt tolerated very well, no SOB, no pain, sats 95% on room air. Meribeth Mattes RN

## 2015-12-11 NOTE — Progress Notes (Signed)
PATIENT DETAILS Name: [name] Age: 82 y.o. Sex: male Date of Birth: [birth year] Admit Date: 12/10/2015 Admitting Physician Reubin Milan, MD [id number] Ronnald Ramp, MD  Subjective: [name]-no fever.  Assessment/Plan: Principal Problem: HCAP (healthcare-associated pneumonia): Clinically improved-no fever overnight, continue IV vancomycin/Levaquin/Aztreonam. Urine Legionella/streptococcal antigen negative. Await culture data.  Active Problems: Recurrent pleural effusion on right: Pleurx catheter placed recently-continue to drain every 3 days. Presumed secondary to chronic systolic heart failure   Chronic systolic heart failure: compensated, EF on 11/05/15 by TTE 25-30%. Continue Lasix, and metoprolol. Allergic to ACEI.  Chronic atrial fibrillation: rate controlled. Continue amiodarone metoprolol and digoxin.CHADs2vasc 6-continue Eliquis  History of CAD: Nonobstructive by last cardiac catheterization. No chest pain or shortness of breath currently.  Hypothyroidism: Continue Synthroid.  Type 2 diabetes: CBG stable, continue SSI.  BPH: Continue Flomax and Proscar  Disposition: Remain inpatient  Antimicrobial agents  See below  Anti-infectives    Start     Dose/Rate Route Frequency Ordered Stop   12/11/15 2000  levofloxacin (LEVAQUIN) IVPB 750 mg     750 mg 100 mL/hr over 90 Minutes Intravenous Every 24 hours 12/10/15 2026     12/10/15 2200  aztreonam (AZACTAM) 2 g in dextrose 5 % 50 mL IVPB     2 g 100 mL/hr over 30 Minutes Intravenous 3 times per day 12/10/15 1817     12/10/15 1830  vancomycin (VANCOCIN) 1,500 mg in sodium chloride 0.9 % 500 mL IVPB     1,500 mg 250 mL/hr over 120 Minutes Intravenous  Once 12/10/15 1816     12/10/15 1815  levofloxacin (LEVAQUIN) IVPB 750 mg     750 mg 100 mL/hr over 90 Minutes Intravenous  Once 12/10/15 1806 12/10/15 2019   12/10/15 1815  vancomycin (VANCOCIN) IVPB 1000 mg/200 mL premix  Status:  Discontinued      1,000 mg 200 mL/hr over 60 Minutes Intravenous  Once 12/10/15 1806 12/10/15 1816      DVT Prophylaxis: Eliquis  Code Status: Full code  Family Communication None  Procedures: None  CONSULTS:  None  Time spent 30 minutes-Greater than 50% of this time was spent in counseling, explanation of diagnosis, planning of further management, and coordination of care.  MEDICATIONS: Scheduled Meds: . amiodarone  200 mg Oral BID  . apixaban  2.5 mg Oral BID  . azelastine  2 spray Each Nare BID  . aztreonam  2 g Intravenous 3 times per day  . digoxin  0.125 mg Oral QODAY  . finasteride  5 mg Oral Daily  . [START ON 12/12/2015] furosemide  20 mg Oral QODAY  . levofloxacin (LEVAQUIN) IV  750 mg Intravenous Q24H  . levothyroxine  25 mcg Oral QAC breakfast  . lipase/protease/amylase  12,000 Units Oral TID AC  . metoprolol tartrate  25 mg Oral BID  . pantoprazole  40 mg Oral Daily  . sodium chloride  3 mL Intravenous Q12H  . sodium chloride  3 mL Intravenous Q12H  . tamsulosin  0.4 mg Oral BID  . vancomycin  1,500 mg Intravenous Once  . vitamin C  500 mg Oral Daily   Continuous Infusions:  PRN Meds:.sodium chloride, ipratropium, levalbuterol, ondansetron **OR** ondansetron (ZOFRAN) IV, sodium chloride    PHYSICAL EXAM: Vital signs in last 24 hours: Filed Vitals:   12/10/15 2016 12/10/15 2215 12/11/15 0633 12/11/15 1329  BP: 91/52 95/46 107/49 105/42  Pulse: 103 99  97 90  Temp:   98.2 F (36.8 C)   TempSrc:   Oral   Resp: 18     Height:   6\' 3"  (1.905 m)   Weight:   77.1 kg (169 lb 15.6 oz)   SpO2: 92% 95% 95%     Weight change:  Filed Weights   12/11/15 0633  Weight: 77.1 kg (169 lb 15.6 oz)   Body mass index is 21.25 kg/(m^2).   Gen Exam: Awake and alert with clear speech.   Neck: Supple, No JVD.   Chest: B/L Clear.   CVS: S1 S2 irregular Abdomen: soft, BS +, non tender, non distended.  Extremities: no edema, lower extremities warm to touch. Neurologic:  Non Focal.   Skin: No Rash.   Wounds: N/A.   Intake/Output from previous day:  Intake/Output Summary (Last 24 hours) at 12/11/15 1422 Last data filed at 12/11/15 0838  Gross per 24 hour  Intake      0 ml  Output    680 ml  Net   -680 ml     LAB RESULTS: CBC  Recent Labs Lab 12/10/15 1730 12/11/15 0646  WBC 8.5 6.7  HGB 12.9* 12.3*  HCT 38.6* 37.1*  PLT 159 145*  MCV 95.3 96.1  MCH 31.9 31.9  MCHC 33.4 33.2  RDW 14.1 14.1  LYMPHSABS 0.9 0.7  MONOABS 2.0* 1.5*  EOSABS 0.1 0.2  BASOSABS 0.0 0.0    Chemistries   Recent Labs Lab 12/10/15 1730 12/11/15 0646  NA 134* 134*  K 4.5 4.1  CL 99* 102  CO2 27 28  GLUCOSE 184* 148*  BUN 19 16  CREATININE 1.27* 1.10  CALCIUM 8.2* 7.9*  MG  --  1.7    CBG:  Recent Labs Lab 12/06/15 0600 12/06/15 1132 12/06/15 1140 12/10/15 2226 12/11/15 0801  GLUCAP 129* 115* 122* 132* 132*    GFR Estimated Creatinine Clearance: 50.6 mL/min (by C-G formula based on Cr of 1.1).  Coagulation profile  Recent Labs Lab 12/10/15 2306  INR 1.42    Cardiac Enzymes No results for input(s): CKMB, TROPONINI, MYOGLOBIN in the last 168 hours.  Invalid input(s): CK  Invalid input(s): POCBNP No results for input(s): DDIMER in the last 72 hours. No results for input(s): HGBA1C in the last 72 hours. No results for input(s): CHOL, HDL, LDLCALC, TRIG, CHOLHDL, LDLDIRECT in the last 72 hours. No results for input(s): TSH, T4TOTAL, T3FREE, THYROIDAB in the last 72 hours.  Invalid input(s): FREET3 No results for input(s): VITAMINB12, FOLATE, FERRITIN, TIBC, IRON, RETICCTPCT in the last 72 hours. No results for input(s): LIPASE, AMYLASE in the last 72 hours.  Urine Studies No results for input(s): UHGB, CRYS in the last 72 hours.  Invalid input(s): UACOL, UAPR, USPG, UPH, UTP, UGL, UKET, UBIL, UNIT, UROB, ULEU, UEPI, UWBC, URBC, UBAC, CAST, UCOM, BILUA  MICROBIOLOGY: No results found for this or any previous visit (from the  past 240 hour(s)).  RADIOLOGY STUDIES/RESULTS: Dg Chest 2 View  12/10/2015  CLINICAL DATA:  Shortness of breath.  Fever.  Weakness. EXAM: CHEST  2 VIEW COMPARISON:  12/04/2015 chest radiograph. FINDINGS: Catheter fragment appears stable in position in the mediastinum and anterior mid to lower right lung. Right basilar chest tube is stable. Stable cardiomediastinal silhouette with mild cardiomegaly. No pneumothorax. There is interval reaccumulation of a small posterior basilar right pleural effusion. No left pleural effusion. There is new patchy opacity in the mid to lower right lung. There is stable mild patchy opacity  in the medial left lung base. No overt pulmonary edema. IMPRESSION: 1. Interval reaccumulation of a small posterior basilar right pleural effusion despite stable position of the basilar right chest tube. No pneumothorax. 2. New patchy opacity in the mid to lower right lung, suspicious for new pneumonia. 3. Stable mild patchy opacity in the medial left lung base, favor atelectasis. 4. Stable mild cardiomegaly without overt pulmonary edema. Electronically Signed   By: Ilona Sorrel M.D.   On: 12/10/2015 17:25   Dg Chest 2 View  12/01/2015  CLINICAL DATA:  Follow up right pleural effusion EXAM: CHEST  2 VIEW COMPARISON:  November 09, 2015 FINDINGS: No pneumothorax. The catheter in the right pulmonary artery is stable. There is a right-sided pleural effusion with underlying opacity, increased in the interval. The effusion is now moderate in size. No other interval changes or acute abnormalities identified. IMPRESSION: Increasing right-sided pleural effusion with underlying opacity. Electronically Signed   By: Dorise Bullion III M.D   On: 12/01/2015 15:18   Dg Chest Port 1 View  12/04/2015  CLINICAL DATA:  Follow-up from right-sided PleurX catheter insertion for recurrent right-sided pneumothorax. EXAM: PORTABLE CHEST 1 VIEW COMPARISON:  Portable chest x-ray of December 02, 2015 FINDINGS: The PleurX  catheter is in place on the right with its tip projecting over the medial cardiophrenic angle. The pleural effusion volume has markedly decreased. There is no pneumothorax. The left lung is well-expanded. There is no left-sided pneumothorax. The heart is mildly enlarged but stable. The central pulmonary vascularity is prominent. The mediastinum is normal in width. A catheter fragment is stable projecting over the right mid hemi thorax. IMPRESSION: There is no postprocedure complication following PleurX catheter placement on the right. The right pleural effusion appears to bemuch smaller and is nearly not visible. Electronically Signed   By: David  Martinique M.D.   On: 12/04/2015 15:38   Dg Chest Port 1 View  12/02/2015  CLINICAL DATA:  Progressive shortness of breath. History of right pleural effusion. EXAM: PORTABLE CHEST 1 VIEW COMPARISON:  Frontal and lateral views yesterday. Chest CT 11/05/2015 FINDINGS: Catheter in the right hemithorax, corresponding to right pulmonary artery on CT unchanged in position. Progressive right pleural effusion and hazy opacity throughout the right hemithorax. Stable blunting of left costophrenic angle. Cardiomediastinal contours are unchanged. No pneumothorax. The cortex of the right proximal humerus appears irregular, only partially included in the field of view. IMPRESSION: 1. Increasing right pleural effusion, moderate to large in size. 2. Question of irregular cortex right proximal humerus, only partially included. Dedicated humerus radiographs recommended, as well as correlation for any pain in this region. Electronically Signed   By: Jeb Levering M.D.   On: 12/02/2015 22:01   Dg C-arm 1-60 Min-no Report  12/04/2015  CLINICAL DATA: pleural catheter insertion C-ARM 1-60 MINUTES Fluoroscopy was utilized by the requesting physician.  No radiographic interpretation.    Oren Binet, MD  [facility] Pager:[phone]  If 7PM-7AM, please contact  night-coverage [email or web address] Password [email or web address] 12/11/2015, 2:22 PM   LOS: 1 day

## 2015-12-11 NOTE — Care Management Note (Addendum)
Case Management Note  Patient Details  Name: [name] MRN: [id number] Date of Birth: 11/07/1925  Subjective/Objective:                 Patient from [facility] with PNA. [facility] patient entered into [facility] for PNA. Being followed for RN PT OT by Arville Go, referral and resumption of services order placed.    Action/Plan:  DC to ILF with HH. Expected Discharge Date:                  Expected Discharge Plan:  [place]  In-House Referral:     Discharge planning Services  CM Consult  Post Acute Care Choice:  Home Health Choice offered to:  Patient  DME Arranged:    DME Agency:     HH Arranged:  RN, PT, OT HH Agency:  [facility]  Status of Service:  Completed, signed off  Medicare Important Message Given:    Date Medicare IM Given:    Medicare IM give by:    Date Additional Medicare IM Given:    Additional Medicare Important Message give by:     If discussed at [place] of Stay Meetings, dates discussed:    Additional Comments:  Carles Collet, RN 12/11/2015, 2:14 PM

## 2015-12-11 NOTE — Progress Notes (Signed)
   12/11/15 1400  Clinical Encounter Type  Visited With Patient  Visit Type Spiritual support  Referral From Nurse  Spiritual Encounters  Spiritual Needs Emotional;Prayer  Stress Factors  Patient Stress Factors Health changes  Patient requested prayer but also seemed to wish to talk. He said that his wife worries about him and what's going to happen. He is not worried about her as they have a good living situation at [facility].

## 2015-12-12 LAB — COMPREHENSIVE METABOLIC PANEL
ALT: 19 U/L (ref 17–63)
ANION GAP: 7 (ref 5–15)
AST: 28 U/L (ref 15–41)
Albumin: 1.9 g/dL — ABNORMAL LOW (ref 3.5–5.0)
Alkaline Phosphatase: 110 U/L (ref 38–126)
BUN: 16 mg/dL (ref 6–20)
CHLORIDE: 100 mmol/L — AB (ref 101–111)
CO2: 26 mmol/L (ref 22–32)
CREATININE: 1.01 mg/dL (ref 0.61–1.24)
Calcium: 7.9 mg/dL — ABNORMAL LOW (ref 8.9–10.3)
Glucose, Bld: 122 mg/dL — ABNORMAL HIGH (ref 65–99)
Potassium: 4.2 mmol/L (ref 3.5–5.1)
SODIUM: 133 mmol/L — AB (ref 135–145)
Total Bilirubin: 0.6 mg/dL (ref 0.3–1.2)
Total Protein: 4.9 g/dL — ABNORMAL LOW (ref 6.5–8.1)

## 2015-12-12 LAB — GLUCOSE, CAPILLARY
GLUCOSE-CAPILLARY: 136 mg/dL — AB (ref 65–99)
GLUCOSE-CAPILLARY: 152 mg/dL — AB (ref 65–99)
GLUCOSE-CAPILLARY: 165 mg/dL — AB (ref 65–99)
Glucose-Capillary: 112 mg/dL — ABNORMAL HIGH (ref 65–99)

## 2015-12-12 NOTE — Progress Notes (Signed)
PATIENT DETAILS Name: [name] Age: 82 y.o. Sex: male Date of Birth: 27-Aug-1925 Admit Date: 12/10/2015 Admitting Physician Reubin Milan, MD [id number] Ronnald Ramp, MD  Subjective: Feels better-still cought. No fever.  Assessment/Plan: Principal Problem: HCAP (healthcare-associated pneumonia): Clinically improved-no fever overnight-main complaint is some cough-will begin to narrow Abx-, will d/c Azactam-continue IV vancomycin/Levaquin. Urine Legionella/streptococcal antigen negative. Await culture data.  Active Problems: Recurrent pleural effusion on right: Pleurx catheter placed recently-continue to drain every 3 days. Presumed secondary to chronic systolic heart failure   Chronic systolic heart failure: compensated, EF on 11/05/15 by TTE 25-30%. Continue Lasix, and metoprolol. Allergic to ACEI.  Chronic atrial fibrillation: rate controlled. Continue amiodarone metoprolol and digoxin.CHADs2vasc 6-continue Eliquis  History of CAD: Nonobstructive by last cardiac catheterization. No chest pain or shortness of breath currently.  Hypothyroidism: Continue Synthroid.  Type 2 diabetes: CBG stable, continue SSI.  BPH: Continue Flomax and Proscar  Disposition: Remain inpatient-Home with Home health services in 1-2 days  Antimicrobial agents  See below  Anti-infectives    Start     Dose/Rate Route Frequency Ordered Stop   12/11/15 2000  levofloxacin (LEVAQUIN) IVPB 750 mg     750 mg 100 mL/hr over 90 Minutes Intravenous Every 24 hours 12/10/15 2026     12/10/15 2200  aztreonam (AZACTAM) 2 g in dextrose 5 % 50 mL IVPB     2 g 100 mL/hr over 30 Minutes Intravenous 3 times per day 12/10/15 1817     12/10/15 1830  vancomycin (VANCOCIN) 1,500 mg in sodium chloride 0.9 % 500 mL IVPB     1,500 mg 250 mL/hr over 120 Minutes Intravenous  Once 12/10/15 1816     12/10/15 1815  levofloxacin (LEVAQUIN) IVPB 750 mg     750 mg 100 mL/hr over 90 Minutes Intravenous   Once 12/10/15 1806 12/10/15 2019   12/10/15 1815  vancomycin (VANCOCIN) IVPB 1000 mg/200 mL premix  Status:  Discontinued     1,000 mg 200 mL/hr over 60 Minutes Intravenous  Once 12/10/15 1806 12/10/15 1816      DVT Prophylaxis: Eliquis  Code Status: Full code  Family Communication None  Procedures: None  CONSULTS:  None  Time spent 25 minutes-Greater than 50% of this time was spent in counseling, explanation of diagnosis, planning of further management, and coordination of care.  MEDICATIONS: Scheduled Meds: . amiodarone  200 mg Oral BID  . apixaban  2.5 mg Oral BID  . azelastine  2 spray Each Nare BID  . aztreonam  2 g Intravenous 3 times per day  . digoxin  0.125 mg Oral QODAY  . finasteride  5 mg Oral Daily  . furosemide  20 mg Oral QODAY  . insulin aspart  0-9 Units Subcutaneous TID WC  . levofloxacin (LEVAQUIN) IV  750 mg Intravenous Q24H  . levothyroxine  25 mcg Oral QAC breakfast  . lipase/protease/amylase  12,000 Units Oral TID AC  . metoprolol tartrate  25 mg Oral BID  . pantoprazole  40 mg Oral Daily  . sodium chloride  3 mL Intravenous Q12H  . sodium chloride  3 mL Intravenous Q12H  . tamsulosin  0.4 mg Oral BID  . vancomycin  1,500 mg Intravenous Once  . vitamin C  500 mg Oral Daily   Continuous Infusions:  PRN Meds:.sodium chloride, ipratropium, levalbuterol, ondansetron **OR** ondansetron (ZOFRAN) IV, sodium chloride    PHYSICAL EXAM: Vital signs  in last 24 hours: Filed Vitals:   12/11/15 1329 12/11/15 1429 12/11/15 2037 12/12/15 1011  BP: 105/42 107/55 114/63 109/53  Pulse: 90 91 100 87  Temp:  98.8 F (37.1 C) 98.2 F (36.8 C)   TempSrc:  Oral Oral   Resp:  18 18   Height:      Weight:      SpO2:  93% 95%     Weight change:  Filed Weights   12/11/15 0633  Weight: 77.1 kg (169 lb 15.6 oz)   Body mass index is 21.25 kg/(m^2).   Gen Exam: Awake and alert with clear speech.   Neck: Supple, No JVD.   Chest: B/L Clear.   CVS: S1  S2 irregular Abdomen: soft, BS +, non tender, non distended.  Extremities: no edema, lower extremities warm to touch. Neurologic: Non Focal.   Skin: No Rash.   Wounds: N/A.   Intake/Output from previous day:  Intake/Output Summary (Last 24 hours) at 12/12/15 1325 Last data filed at 12/12/15 1145  Gross per 24 hour  Intake    250 ml  Output   2125 ml  Net  -1875 ml     LAB RESULTS: CBC  Recent Labs Lab 12/10/15 1730 12/11/15 0646  WBC 8.5 6.7  HGB 12.9* 12.3*  HCT 38.6* 37.1*  PLT 159 145*  MCV 95.3 96.1  MCH 31.9 31.9  MCHC 33.4 33.2  RDW 14.1 14.1  LYMPHSABS 0.9 0.7  MONOABS 2.0* 1.5*  EOSABS 0.1 0.2  BASOSABS 0.0 0.0    Chemistries   Recent Labs Lab 12/10/15 1730 12/11/15 0646 12/12/15 0546  NA 134* 134* 133*  K 4.5 4.1 4.2  CL 99* 102 100*  CO2 27 28 26   GLUCOSE 184* 148* 122*  BUN 19 16 16   CREATININE 1.27* 1.10 1.01  CALCIUM 8.2* 7.9* 7.9*  MG  --  1.7  --     CBG:  Recent Labs Lab 12/11/15 0801 12/11/15 1700 12/12/15 0011 12/12/15 0939 12/12/15 1139  GLUCAP 132* 199* 136* 165* 152*    GFR Estimated Creatinine Clearance: 55.1 mL/min (by C-G formula based on Cr of 1.01).  Coagulation profile  Recent Labs Lab 12/10/15 2306  INR 1.42    Cardiac Enzymes No results for input(s): CKMB, TROPONINI, MYOGLOBIN in the last 168 hours.  Invalid input(s): CK  Invalid input(s): POCBNP No results for input(s): DDIMER in the last 72 hours. No results for input(s): HGBA1C in the last 72 hours. No results for input(s): CHOL, HDL, LDLCALC, TRIG, CHOLHDL, LDLDIRECT in the last 72 hours. No results for input(s): TSH, T4TOTAL, T3FREE, THYROIDAB in the last 72 hours.  Invalid input(s): FREET3 No results for input(s): VITAMINB12, FOLATE, FERRITIN, TIBC, IRON, RETICCTPCT in the last 72 hours. No results for input(s): LIPASE, AMYLASE in the last 72 hours.  Urine Studies No results for input(s): UHGB, CRYS in the last 72 hours.  Invalid  input(s): UACOL, UAPR, USPG, UPH, UTP, UGL, UKET, UBIL, UNIT, UROB, ULEU, UEPI, UWBC, URBC, UBAC, CAST, UCOM, BILUA  MICROBIOLOGY: No results found for this or any previous visit (from the past 240 hour(s)).  RADIOLOGY STUDIES/RESULTS: Dg Chest 2 View  12/10/2015  CLINICAL DATA:  Shortness of breath.  Fever.  Weakness. EXAM: CHEST  2 VIEW COMPARISON:  12/04/2015 chest radiograph. FINDINGS: Catheter fragment appears stable in position in the mediastinum and anterior mid to lower right lung. Right basilar chest tube is stable. Stable cardiomediastinal silhouette with mild cardiomegaly. No pneumothorax. There is interval reaccumulation  of a small posterior basilar right pleural effusion. No left pleural effusion. There is new patchy opacity in the mid to lower right lung. There is stable mild patchy opacity in the medial left lung base. No overt pulmonary edema. IMPRESSION: 1. Interval reaccumulation of a small posterior basilar right pleural effusion despite stable position of the basilar right chest tube. No pneumothorax. 2. New patchy opacity in the mid to lower right lung, suspicious for new pneumonia. 3. Stable mild patchy opacity in the medial left lung base, favor atelectasis. 4. Stable mild cardiomegaly without overt pulmonary edema. Electronically Signed   By: Ilona Sorrel M.D.   On: 12/10/2015 17:25   Dg Chest 2 View  12/01/2015  CLINICAL DATA:  Follow up right pleural effusion EXAM: CHEST  2 VIEW COMPARISON:  November 09, 2015 FINDINGS: No pneumothorax. The catheter in the right pulmonary artery is stable. There is a right-sided pleural effusion with underlying opacity, increased in the interval. The effusion is now moderate in size. No other interval changes or acute abnormalities identified. IMPRESSION: Increasing right-sided pleural effusion with underlying opacity. Electronically Signed   By: Dorise Bullion III M.D   On: 12/01/2015 15:18   Dg Chest Port 1 View  12/04/2015  CLINICAL DATA:   Follow-up from right-sided PleurX catheter insertion for recurrent right-sided pneumothorax. EXAM: PORTABLE CHEST 1 VIEW COMPARISON:  Portable chest x-ray of December 02, 2015 FINDINGS: The PleurX catheter is in place on the right with its tip projecting over the medial cardiophrenic angle. The pleural effusion volume has markedly decreased. There is no pneumothorax. The left lung is well-expanded. There is no left-sided pneumothorax. The heart is mildly enlarged but stable. The central pulmonary vascularity is prominent. The mediastinum is normal in width. A catheter fragment is stable projecting over the right mid hemi thorax. IMPRESSION: There is no postprocedure complication following PleurX catheter placement on the right. The right pleural effusion appears to bemuch smaller and is nearly not visible. Electronically Signed   By: David  Martinique M.D.   On: 12/04/2015 15:38   Dg Chest Port 1 View  12/02/2015  CLINICAL DATA:  Progressive shortness of breath. History of right pleural effusion. EXAM: PORTABLE CHEST 1 VIEW COMPARISON:  Frontal and lateral views yesterday. Chest CT 11/05/2015 FINDINGS: Catheter in the right hemithorax, corresponding to right pulmonary artery on CT unchanged in position. Progressive right pleural effusion and hazy opacity throughout the right hemithorax. Stable blunting of left costophrenic angle. Cardiomediastinal contours are unchanged. No pneumothorax. The cortex of the right proximal humerus appears irregular, only partially included in the field of view. IMPRESSION: 1. Increasing right pleural effusion, moderate to large in size. 2. Question of irregular cortex right proximal humerus, only partially included. Dedicated humerus radiographs recommended, as well as correlation for any pain in this region. Electronically Signed   By: Jeb Levering M.D.   On: 12/02/2015 22:01   Dg C-arm 1-60 Min-no Report  12/04/2015  CLINICAL DATA: pleural catheter insertion C-ARM 1-60 MINUTES  Fluoroscopy was utilized by the requesting physician.  No radiographic interpretation.    Oren Binet, MD  [facility] Pager:[phone]  If 7PM-7AM, please contact night-coverage [email or web address] Password [email or web address] 12/12/2015, 1:25 PM   LOS: 2 days

## 2015-12-12 NOTE — Evaluation (Signed)
Physical Therapy Evaluation Patient Details Name: [name] MRN: [id number] DOB: [birth year] Today's Date: 12/12/2015   History of Present Illness  82 y.o. male re-admitted 4 days post hospital d/c with pna. Recent recurrent Rt pleural effusion with pleurex catheter placed. PMHx-PAF, DM, CAD, HTN, sarcoma (tricep), seizure, and Vtach  Clinical Impression  Pt admitted with above diagnosis. Patient was progressing well at home with HHPT until recent illness. Feel patient will be able to return to that setting (hopefully 1/16). Remains weak, light-headed when mobilizing. Pt currently with functional limitations due to the deficits listed below (see PT Problem List).  Pt will benefit from skilled PT to increase their independence and safety with mobility to allow discharge to the venue listed below.       Follow Up Recommendations Home health PT;Supervision for mobility/OOB    Equipment Recommendations  None recommended by PT    Recommendations for Other Services OT consult     Precautions / Restrictions Precautions Precautions: Fall Precaution Comments: Rt pleurex catheter      Mobility  Bed Mobility Overal bed mobility: Needs Assistance Bed Mobility: Supine to Sit     Supine to sit: Min guard     General bed mobility comments: HOB 30, with rail (pt wanting to do for himself); nearly required assist x 2 as losing balance posteriorly (able to recover)  Transfers Overall transfer level: Needs assistance Equipment used: Rolling walker (2 wheeled) Transfers: Sit to/from Stand Sit to Stand: Min assist         General transfer comment: steadying   Ambulation/Gait Ambulation/Gait assistance: Min assist Ambulation Distance (Feet): 10 Feet Assistive device: Rolling walker (2 wheeled) Gait Pattern/deviations: Step-through pattern;Decreased stride length;Trunk flexed Gait velocity: slow Gait velocity interpretation: Below normal speed for age/gender General Gait Details:  limited by dizziness and general fatigue  Stairs            Wheelchair Mobility    Modified Rankin (Stroke Patients Only)       Balance Overall balance assessment: Needs assistance Sitting-balance support: No upper extremity supported;Feet supported Sitting balance-Leahy Scale: Poor Sitting balance - Comments: loss of balance posteriorly   Standing balance support: Bilateral upper extremity supported Standing balance-Leahy Scale: Poor                               Pertinent Vitals/Pain SaO2 93-97% on RA; lots of productive coughing during session (very fatiguing to pt)  Pain Assessment: Faces Faces Pain Scale: Hurts even more Pain Location: chest when coughs Pain Descriptors / Indicators: Grimacing Pain Intervention(s): Monitored during session;Limited activity within patient's tolerance;Repositioned    Home Living Family/patient expects to be discharged to:: Private residence Living Arrangements: Spouse/significant other Available Help at Discharge: Family;Available PRN/intermittently Type of Home: Independent living facility Home Access: Elevator;Level entry     Home Layout: One level Home Equipment: Mobile - 2 wheels;Cane - single point;Shower seat - built in      Prior Function Level of Independence: Independent with assistive device(s)         Comments: receiving HHPT s/p d/c and progressed to using cane at home     Hand Dominance   Dominant Hand: Right    Extremity/Trunk Assessment   Upper Extremity Assessment: Generalized weakness           Lower Extremity Assessment: Generalized weakness      Cervical / Trunk Assessment: Normal  Communication   Communication: No difficulties  Cognition  Arousal/Alertness: Awake/alert Behavior During Therapy: WFL for tasks assessed/performed Overall Cognitive Status: Within Functional Limits for tasks assessed                      General Comments      Exercises General  Exercises - Upper Extremity Elbow Flexion: AROM;Both;20 reps;Seated Digit Composite Flexion: AROM;Both;20 reps;Seated      Assessment/Plan    PT Assessment Patient needs continued PT services  PT Diagnosis Difficulty walking;Generalized weakness   PT Problem List Decreased strength;Decreased activity tolerance;Decreased balance;Decreased mobility;Cardiopulmonary status limiting activity;Pain  PT Treatment Interventions DME instruction;Gait training;Functional mobility training;Therapeutic activities;Therapeutic exercise;Balance training;Patient/family education   PT Goals (Current goals can be found in the Care Plan section) Acute Rehab PT Goals Patient Stated Goal: return to ILF with wife PT Goal Formulation: With patient Time For Goal Achievement: 05-Jan-2016 Potential to Achieve Goals: Good    Frequency Min 3X/week   Barriers to discharge        Co-evaluation               End of Session   Activity Tolerance: Patient limited by fatigue;Other (comment) (lots of coughing) Patient left: in chair;with call bell/phone within reach;with chair alarm set Nurse Communication: Mobility status         Time: [id number] PT Time Calculation (min) (ACUTE ONLY): 31 min   Charges:   PT Evaluation $PT Eval Moderate Complexity: 1 Procedure PT Treatments $Therapeutic Activity: 8-22 mins   PT G Codes:        Trenity Pha January 05, 2016, 12:50 PM Pager [phone]

## 2015-12-13 LAB — COMPREHENSIVE METABOLIC PANEL
ALBUMIN: 1.8 g/dL — AB (ref 3.5–5.0)
ALK PHOS: 106 U/L (ref 38–126)
ALT: 21 U/L (ref 17–63)
AST: 35 U/L (ref 15–41)
Anion gap: 9 (ref 5–15)
BUN: 17 mg/dL (ref 6–20)
CALCIUM: 7.8 mg/dL — AB (ref 8.9–10.3)
CO2: 26 mmol/L (ref 22–32)
CREATININE: 1.08 mg/dL (ref 0.61–1.24)
Chloride: 98 mmol/L — ABNORMAL LOW (ref 101–111)
GFR calc Af Amer: >60 mL/min (ref >60)
GFR calc non Af Amer: 59 mL/min — ABNORMAL LOW (ref >60)
GLUCOSE: 127 mg/dL — AB (ref 65–99)
Potassium: 3.8 mmol/L (ref 3.5–5.1)
SODIUM: 133 mmol/L — AB (ref 135–145)
Total Bilirubin: 0.7 mg/dL (ref 0.3–1.2)
Total Protein: 5 g/dL — ABNORMAL LOW (ref 6.5–8.1)

## 2015-12-13 LAB — GLUCOSE, CAPILLARY
Glucose-Capillary: 122 mg/dL — ABNORMAL HIGH (ref 65–99)
Glucose-Capillary: 188 mg/dL — ABNORMAL HIGH (ref 65–99)
Glucose-Capillary: 101 mg/dL — ABNORMAL HIGH (ref 65–99)
Glucose-Capillary: 179 mg/dL — ABNORMAL HIGH (ref 65–99)

## 2015-12-13 NOTE — Progress Notes (Signed)
Physical Therapy Treatment Patient Details Name: [name] MRN: [id number] DOB: 05-30-1925 Today's Date: 12/13/2015    History of Present Illness 82 y.o. male re-admitted 4 days post hospital d/c with pna. Recent recurrent Rt pleural effusion with pleurex catheter placed. PMHx-PAF, DM, CAD, HTN, sarcoma (tricep), seizure, and Vtach    PT Comments    Pt progressing slowly towards physical therapy goals. Was able to improve ambulation distance this session however continues to demonstrate an overall poor tolerance for functional activity. Pt is open to short term rehab at d/c to improve overall function and independence, and lessen burden of care on his wife. Will continue to follow.  Follow Up Recommendations  Supervision for mobility/OOB;SNF     Equipment Recommendations  None recommended by PT    Recommendations for Other Services OT consult     Precautions / Restrictions Precautions Precautions: Fall Precaution Comments: Rt pleurex catheter Restrictions Weight Bearing Restrictions: No    Mobility  Bed Mobility Overal bed mobility: Needs Assistance Bed Mobility: Supine to Sit     Supine to sit: Min assist     General bed mobility comments: HOB elevated. Pt attempted to achieve full sitting position x3 before he would accept assist from therapist to fully elevate shoulders.   Transfers Overall transfer level: Needs assistance Equipment used: Rolling walker (2 wheeled) Transfers: Sit to/from Stand Sit to Stand: Min assist         General transfer comment: Assist to power-up to full standing position from bed in lowest position.   Ambulation/Gait Ambulation/Gait assistance: Min guard Ambulation Distance (Feet): 30 Feet Assistive device: Rolling walker (2 wheeled) Gait Pattern/deviations: Step-through pattern;Decreased stride length;Trunk flexed Gait velocity: Decreased Gait velocity interpretation: Below normal speed for age/gender General Gait Details:  Pt required encouragement to get to the hallway. Was not willing to get too far away from the room as he was fearful of getting too fatigued to make it back to the chair.    Stairs            Wheelchair Mobility    Modified Rankin (Stroke Patients Only)       Balance Overall balance assessment: Needs assistance Sitting-balance support: Feet supported;No upper extremity supported Sitting balance-Leahy Scale: Poor Sitting balance - Comments: Initially   Standing balance support: Bilateral upper extremity supported;During functional activity Standing balance-Leahy Scale: Poor Standing balance comment: Requires UE support on the walker to maintain standing balance.                     Cognition Arousal/Alertness: Awake/alert Behavior During Therapy: WFL for tasks assessed/performed Overall Cognitive Status: Within Functional Limits for tasks assessed                      Exercises      General Comments        Pertinent Vitals/Pain Pain Assessment: Faces Faces Pain Scale: Hurts a little bit Pain Location: Chest with coughing Pain Descriptors / Indicators: Grimacing Pain Intervention(s): Limited activity within patient's tolerance;Monitored during session;Repositioned    Home Living                      Prior Function            PT Goals (current goals can now be found in the care plan section) Acute Rehab PT Goals Patient Stated Goal: return to ILF with wife PT Goal Formulation: With patient Time For Goal Achievement: 12/12/15 Potential to Achieve  Goals: Good Progress towards PT goals: Progressing toward goals    Frequency  Min 3X/week    PT Plan Discharge plan needs to be updated    Co-evaluation             End of Session Equipment Utilized During Treatment: Gait belt Activity Tolerance: Patient limited by fatigue Patient left: in chair;with call bell/phone within reach;with chair alarm set     Time: 1031-1053 PT  Time Calculation (min) (ACUTE ONLY): 22 min  Charges:  $Gait Training: 8-22 mins                    G Codes:      Rolinda Roan December 17, 2015, 11:34 AM   Rolinda Roan, PT, DPT Acute Rehabilitation Services Pager: [phone]

## 2015-12-13 NOTE — Clinical Social Work Note (Signed)
CSW met with patient to discuss SNF referral, patient is in agreement to going to SNF for short term rehab.  CSW to begin SNF search process, formal assessment to follow.  Patient is from [facility], but would like [place] [facility]F.  Eric R. Anterhaus, MSW, LCSWA [phone] 12/13/2015 6:43 PM  

## 2015-12-13 NOTE — Progress Notes (Signed)
PATIENT DETAILS Name: [name] Age: 82 y.o. Sex: male Date of Birth: [birth year] Admit Date: 12/10/2015 Admitting Physician Reubin Milan, MD [id number] Ronnald Ramp, MD  Subjective: Feels better-still cought. No fever.But still very weak  Assessment/Plan: Principal Problem: HCAP (healthcare-associated pneumonia): Clinically improved-afebrile cough improving. Antibiotics narrowed down to levofloxacin. Urine Legionella/streptococcal antigen negative. Blood cultures negative so far. Is very deconditioned, would need to go to SNF on discharge. Social work consult.  Active Problems: Recurrent pleural effusion on right: Pleurx catheter placed recently-continue to drain every 3 days. Presumed secondary to chronic systolic heart failure   Chronic systolic heart failure: compensated, EF on 11/05/15 by TTE 25-30%. Continue Lasix, and metoprolol. Allergic to ACEI.  Chronic atrial fibrillation: rate controlled. Continue amiodarone metoprolol and digoxin.CHADs2vasc 6-continue Eliquis  History of CAD: Nonobstructive CAD by last cardiac catheterization. No chest pain or shortness of breath currently.  Hypothyroidism: Continue Synthroid.  Type 2 diabetes: CBG stable, continue SSI.  BPH: Continue Flomax and Proscar  Disposition: Remain inpatient-SNF tomorrow  Antimicrobial agents  See below  Anti-infectives    Start     Dose/Rate Route Frequency Ordered Stop   12/11/15 2000  levofloxacin (LEVAQUIN) IVPB 750 mg     750 mg 100 mL/hr over 90 Minutes Intravenous Every 24 hours 12/10/15 2026     12/10/15 2200  aztreonam (AZACTAM) 2 g in dextrose 5 % 50 mL IVPB  Status:  Discontinued     2 g 100 mL/hr over 30 Minutes Intravenous 3 times per day 12/10/15 1817 12/12/15 1326   12/10/15 1830  vancomycin (VANCOCIN) 1,500 mg in sodium chloride 0.9 % 500 mL IVPB  Status:  Discontinued     1,500 mg 250 mL/hr over 120 Minutes Intravenous  Once 12/10/15 1816 12/12/15 1438   12/10/15 1815  levofloxacin (LEVAQUIN) IVPB 750 mg     750 mg 100 mL/hr over 90 Minutes Intravenous  Once 12/10/15 1806 12/10/15 2019   12/10/15 1815  vancomycin (VANCOCIN) IVPB 1000 mg/200 mL premix  Status:  Discontinued     1,000 mg 200 mL/hr over 60 Minutes Intravenous  Once 12/10/15 1806 12/10/15 1816      DVT Prophylaxis: Eliquis  Code Status: Full code  Family Communication None  Procedures: None  CONSULTS:  None  Time spent 25 minutes-Greater than 50% of this time was spent in counseling, explanation of diagnosis, planning of further management, and coordination of care.  MEDICATIONS: Scheduled Meds: . amiodarone  200 mg Oral BID  . apixaban  2.5 mg Oral BID  . azelastine  2 spray Each Nare BID  . digoxin  0.125 mg Oral QODAY  . finasteride  5 mg Oral Daily  . furosemide  20 mg Oral QODAY  . insulin aspart  0-9 Units Subcutaneous TID WC  . levofloxacin (LEVAQUIN) IV  750 mg Intravenous Q24H  . levothyroxine  25 mcg Oral QAC breakfast  . lipase/protease/amylase  12,000 Units Oral TID AC  . metoprolol tartrate  25 mg Oral BID  . pantoprazole  40 mg Oral Daily  . sodium chloride  3 mL Intravenous Q12H  . sodium chloride  3 mL Intravenous Q12H  . tamsulosin  0.4 mg Oral BID  . vitamin C  500 mg Oral Daily   Continuous Infusions:  PRN Meds:.sodium chloride, ipratropium, levalbuterol, ondansetron **OR** ondansetron (ZOFRAN) IV, sodium chloride    PHYSICAL EXAM: Vital signs in last 24 hours: Filed Vitals:  12/12/15 1342 12/12/15 2105 12/13/15 0630 12/13/15 0936  BP: 110/60  97/50 95/45  Pulse: 90  93 95  Temp: 98.1 F (36.7 C)  97.7 F (36.5 C)   TempSrc: Oral  Oral   Resp: 18  18   Height:      Weight:  77.3 kg (170 lb 6.7 oz) 77.4 kg (170 lb 10.2 oz)   SpO2: 96%  94%     Weight change:  Filed Weights   12/11/15 0633 12/12/15 2105 12/13/15 0630  Weight: 77.1 kg (169 lb 15.6 oz) 77.3 kg (170 lb 6.7 oz) 77.4 kg (170 lb 10.2 oz)   Body mass  index is 21.33 kg/(m^2).   Gen Exam: Awake and alert with clear speech.   Neck: Supple, No JVD.   Chest: B/L Clear.   CVS: S1 S2 irregular Abdomen: soft, BS +, non tender, non distended.  Extremities: no edema, lower extremities warm to touch. Neurologic: Non Focal.   Skin: No Rash.   Wounds: N/A.   Intake/Output from previous day:  Intake/Output Summary (Last 24 hours) at 12/13/15 1305 Last data filed at 12/13/15 0750  Gross per 24 hour  Intake      0 ml  Output    550 ml  Net   -550 ml     LAB RESULTS: CBC  Recent Labs Lab 12/10/15 1730 12/11/15 0646  WBC 8.5 6.7  HGB 12.9* 12.3*  HCT 38.6* 37.1*  PLT 159 145*  MCV 95.3 96.1  MCH 31.9 31.9  MCHC 33.4 33.2  RDW 14.1 14.1  LYMPHSABS 0.9 0.7  MONOABS 2.0* 1.5*  EOSABS 0.1 0.2  BASOSABS 0.0 0.0    Chemistries   Recent Labs Lab 12/10/15 1730 12/11/15 0646 12/12/15 0546 12/13/15 0611  NA 134* 134* 133* 133*  K 4.5 4.1 4.2 3.8  CL 99* 102 100* 98*  CO2 27 28 26 26   GLUCOSE 184* 148* 122* 127*  BUN 19 16 16 17   CREATININE 1.27* 1.10 1.01 1.08  CALCIUM 8.2* 7.9* 7.9* 7.8*  MG  --  1.7  --   --     CBG:  Recent Labs Lab 12/12/15 0939 12/12/15 1139 12/12/15 1712 12/13/15 0809 12/13/15 1202  GLUCAP 165* 152* 112* 122* 188*    GFR Estimated Creatinine Clearance: 51.8 mL/min (by C-G formula based on Cr of 1.08).  Coagulation profile  Recent Labs Lab 12/10/15 2306  INR 1.42    Cardiac Enzymes No results for input(s): CKMB, TROPONINI, MYOGLOBIN in the last 168 hours.  Invalid input(s): CK  Invalid input(s): POCBNP No results for input(s): DDIMER in the last 72 hours. No results for input(s): HGBA1C in the last 72 hours. No results for input(s): CHOL, HDL, LDLCALC, TRIG, CHOLHDL, LDLDIRECT in the last 72 hours. No results for input(s): TSH, T4TOTAL, T3FREE, THYROIDAB in the last 72 hours.  Invalid input(s): FREET3 No results for input(s): VITAMINB12, FOLATE, FERRITIN, TIBC, IRON,  RETICCTPCT in the last 72 hours. No results for input(s): LIPASE, AMYLASE in the last 72 hours.  Urine Studies No results for input(s): UHGB, CRYS in the last 72 hours.  Invalid input(s): UACOL, UAPR, USPG, UPH, UTP, UGL, UKET, UBIL, UNIT, UROB, ULEU, UEPI, UWBC, URBC, UBAC, CAST, UCOM, BILUA  MICROBIOLOGY: Recent Results (from the past 240 hour(s))  Culture, blood (routine x 2) Call MD if unable to obtain prior to antibiotics being given     Status: None (Preliminary result)   Collection Time: 12/10/15 11:06 PM  Result Value Ref Range  Status   Specimen Description BLOOD LEFT ARM  Final   Special Requests BOTTLES DRAWN AEROBIC AND ANAEROBIC 5ML  Final   Culture NO GROWTH 1 DAY  Final   Report Status PENDING  Incomplete  Culture, blood (routine x 2) Call MD if unable to obtain prior to antibiotics being given     Status: None (Preliminary result)   Collection Time: 12/10/15 11:13 PM  Result Value Ref Range Status   Specimen Description BLOOD RIGHT ARM  Final   Special Requests BOTTLES DRAWN AEROBIC AND ANAEROBIC 5ML  Final   Culture NO GROWTH 1 DAY  Final   Report Status PENDING  Incomplete    RADIOLOGY STUDIES/RESULTS: Dg Chest 2 View  12/10/2015  CLINICAL DATA:  Shortness of breath.  Fever.  Weakness. EXAM: CHEST  2 VIEW COMPARISON:  12/04/2015 chest radiograph. FINDINGS: Catheter fragment appears stable in position in the mediastinum and anterior mid to lower right lung. Right basilar chest tube is stable. Stable cardiomediastinal silhouette with mild cardiomegaly. No pneumothorax. There is interval reaccumulation of a small posterior basilar right pleural effusion. No left pleural effusion. There is new patchy opacity in the mid to lower right lung. There is stable mild patchy opacity in the medial left lung base. No overt pulmonary edema. IMPRESSION: 1. Interval reaccumulation of a small posterior basilar right pleural effusion despite stable position of the basilar right chest tube.  No pneumothorax. 2. New patchy opacity in the mid to lower right lung, suspicious for new pneumonia. 3. Stable mild patchy opacity in the medial left lung base, favor atelectasis. 4. Stable mild cardiomegaly without overt pulmonary edema. Electronically Signed   By: Ilona Sorrel M.D.   On: 12/10/2015 17:25   Dg Chest 2 View  12/01/2015  CLINICAL DATA:  Follow up right pleural effusion EXAM: CHEST  2 VIEW COMPARISON:  November 09, 2015 FINDINGS: No pneumothorax. The catheter in the right pulmonary artery is stable. There is a right-sided pleural effusion with underlying opacity, increased in the interval. The effusion is now moderate in size. No other interval changes or acute abnormalities identified. IMPRESSION: Increasing right-sided pleural effusion with underlying opacity. Electronically Signed   By: Dorise Bullion III M.D   On: 12/01/2015 15:18   Dg Chest Port 1 View  12/04/2015  CLINICAL DATA:  Follow-up from right-sided PleurX catheter insertion for recurrent right-sided pneumothorax. EXAM: PORTABLE CHEST 1 VIEW COMPARISON:  Portable chest x-ray of December 02, 2015 FINDINGS: The PleurX catheter is in place on the right with its tip projecting over the medial cardiophrenic angle. The pleural effusion volume has markedly decreased. There is no pneumothorax. The left lung is well-expanded. There is no left-sided pneumothorax. The heart is mildly enlarged but stable. The central pulmonary vascularity is prominent. The mediastinum is normal in width. A catheter fragment is stable projecting over the right mid hemi thorax. IMPRESSION: There is no postprocedure complication following PleurX catheter placement on the right. The right pleural effusion appears to bemuch smaller and is nearly not visible. Electronically Signed   By: David  Martinique M.D.   On: 12/04/2015 15:38   Dg Chest Port 1 View  12/02/2015  CLINICAL DATA:  Progressive shortness of breath. History of right pleural effusion. EXAM: PORTABLE CHEST 1  VIEW COMPARISON:  Frontal and lateral views yesterday. Chest CT 11/05/2015 FINDINGS: Catheter in the right hemithorax, corresponding to right pulmonary artery on CT unchanged in position. Progressive right pleural effusion and hazy opacity throughout the right hemithorax. Stable blunting of  left costophrenic angle. Cardiomediastinal contours are unchanged. No pneumothorax. The cortex of the right proximal humerus appears irregular, only partially included in the field of view. IMPRESSION: 1. Increasing right pleural effusion, moderate to large in size. 2. Question of irregular cortex right proximal humerus, only partially included. Dedicated humerus radiographs recommended, as well as correlation for any pain in this region. Electronically Signed   By: Jeb Levering M.D.   On: 12/02/2015 22:01   Dg C-arm 1-60 Min-no Report  12/04/2015  CLINICAL DATA: pleural catheter insertion C-ARM 1-60 MINUTES Fluoroscopy was utilized by the requesting physician.  No radiographic interpretation.    Oren Binet, MD  [facility] Pager:[phone]  If 7PM-7AM, please contact night-coverage [email or web address] Password [email or web address] 12/13/2015, 1:05 PM   LOS: 3 days

## 2015-12-14 LAB — GLUCOSE, CAPILLARY
GLUCOSE-CAPILLARY: 119 mg/dL — AB (ref 65–99)
Glucose-Capillary: 145 mg/dL — ABNORMAL HIGH (ref 65–99)
Glucose-Capillary: 152 mg/dL — ABNORMAL HIGH (ref 65–99)

## 2015-12-14 MED ORDER — LEVOFLOXACIN 750 MG PO TABS: 750.0000 mg | ORAL_TABLET | Freq: Every day | ORAL | Status: AC

## 2015-12-14 NOTE — Progress Notes (Signed)
Per SW-son in law was requesting d/c back to [facility]-Independent living with [facility] services. Spoke with patient-who is completely awake/alert and oriented. He has tried [facility] services at the facilty in the past and thinks that it is not enough and is wanting to go to [facility]. Have spoke/notified Education officer, museum.

## 2015-12-14 NOTE — Care Management Note (Addendum)
Case Management Note  Patient Details  Name: LUCERO ELDEN MRN: [id number] Date of Birth: 11-09-1925             Subjective/Objective: Patient from [place] with PNA. THN patient entered into [facility] for PNA. Being followed for RN PT OT by [facility], referral and resumption of services order placed.    Action/Plan:  Addendum 12-14-15. Patient to DC to SNF as facilitated through [facility]. Patient is getting PLeurex drain changed today (will need again in 3 days). He states that his son in law will be able to being his box from home to the facility he discharges to.  [facility] notified of DC to SNF.   Expected Discharge Date:                  Expected Discharge Plan:  [place]  In-House Referral:     Discharge planning Services  CM Consult  Post Acute Care Choice:  Home Health Choice offered to:  Patient  DME Arranged:   (Patient states that his son in law can bring box of pleaurex drains to him at the SNF.) DME Agency:     HH Arranged:    [place] Agency:     Status of Service:  Completed, signed off  Medicare Important Message Given:    Date Medicare IM Given:    Medicare IM give by:    Date Additional Medicare IM Given:    Additional Medicare Important Message give by:     If discussed at [place] of Stay Meetings, dates discussed:    Additional Comments:  Carles Collet, RN 12/14/2015, 12:39 PM

## 2015-12-14 NOTE — Discharge Summary (Addendum)
Physician Discharge Summary  DENVIL ALEXIOU [id number] DOB: 1925-07-08 DOA: 12/10/2015  PCP: [name], MD  Admit date: 12/10/2015 Discharge date: 12/14/2015  Time spent: 35 minutes  Recommendations for Outpatient Follow-up:  1. Continue oral Levofloxacin for 3 more days 2. Please recheck CBC and BMET at follow up visit 3. Please check chest x-ray in 4-6 weeks to document resolution of pneumonia 4. Drain Pleurx catheter every 3 days. 5. Ensure follow-up with cardiothoracic surgery-Dr. Cyndia Bent  Discharge Diagnoses:  Principal Problem:   HCAP (healthcare-associated pneumonia) Active Problems:   Type II diabetes mellitus with manifestations ([place])   CAD (coronary artery disease)   Hypothyroid   Pancreatic insufficiency (HCC)   S/P thoracentesis   Recurrent pleural effusion on right   Chronic systolic CHF (congestive heart failure) ([place])   Discharge Condition: Stable  Diet recommendation: Low sodium, heart healthy  Filed Weights   12/11/15 0633 12/12/15 2105 12/13/15 0630  Weight: 77.1 kg (169 lb 15.6 oz) 77.3 kg (170 lb 6.7 oz) 77.4 kg (170 lb 10.2 oz)    History of present illness:  See H&P, Labs, Consult and Test reports for all details in brief, patient is a 82 yo male, with history of atrial fibrillation, CAD, chronic systolic CHF, hypertension, and type 2 diabetes, presented with shortness of breath. Further evaluation revealed health-care associated pneumonia.    Hospital Course:  Principal Problem: HCAP (healthcare-associated pneumonia): Clinically improved-afebrile, cough continues to improve. Antibiotics narrowed down to levofloxacin. Urine Legionella/streptococcal antigen negative. Blood cultures negative. Is very deconditioned, discharging to SNF today with 3 more days of oral levofloxacin.   Active Problems: Recurrent pleural effusion on right: Pleurx catheter placed recently-continue to drain every 3 days. Presumed secondary to chronic systolic heart failure.  Ensure follow up with Dr Bartle-cardiothoracic surgery  Chronic systolic heart failure: Compensated, EF on 11/05/15 by TTE 25-30%. Continue Lasix, and metoprolol. Allergic to ACEI.  Chronic atrial fibrillation: Rate controlled. Continue amiodarone metoprolol and digoxin.CHADs2vasc 6-continue Eliquis  History of CAD: Nonobstructive CAD by last cardiac catheterization. No chest pain or shortness of breath currently.  Hypothyroidism: Continue Synthroid.  Type 2 diabetes: CBG stable, continue SSI.  BPH: Continue Flomax and Proscar  Procedures: None  Consultations: None  Subjective: Is feeling better today, no fever but still feels weak.  Objective: Discharge Exam: Filed Vitals:   12/14/15 0602 12/14/15 0940  BP: 95/54   Pulse: 93 96  Temp: 97.8 F (36.6 C)   Resp: 18     General: Awake and alert, in no acute distress Cardiovascular: S1, S2 irregular Respiratory: CTA bilaterally  Abdomen: Soft, non-tender Extremities: no edema  Discharge Instructions Check CBGs before meals and at bedtime  Discharge Instructions    Call MD for:  difficulty breathing, headache or visual disturbances    Complete by:  As directed      Call MD for:  temperature >100.4    Complete by:  As directed      Diet - low sodium heart healthy    Complete by:  As directed      Increase activity slowly    Complete by:  As directed           Current Discharge Medication List    START taking these medications   Details  levofloxacin (LEVAQUIN) 750 MG tablet Take 1 tablet (750 mg total) by mouth daily. 3 more days from 1/17 Refills: 0      CONTINUE these medications which have NOT CHANGED   Details  amiodarone (PACERONE)  200 MG tablet Take 1 tablet (200 mg total) by mouth 2 (two) times daily. Qty: 180 tablet, Refills: 2    apixaban (ELIQUIS) 2.5 MG TABS tablet Take 1 tablet (2.5 mg total) by mouth 2 (two) times daily. Qty: 60 tablet, Refills: 2    Ascorbic Acid (VITAMIN C) 500 MG tablet  Take 500 mg by mouth daily.     azelastine (ASTELIN) 0.1 % nasal spray Place 2 sprays into both nostrils 2 (two) times daily. Use in each nostril as directed Qty: 90 mL, Refills: 3   Associated Diagnoses: Non-seasonal allergic rhinitis due to pollen    [employer] GLUCOSAMINE-CHONDROITIN PO Take 1 tablet by mouth daily. 1200/600 mg    digoxin (LANOXIN) 0.125 MG tablet Take 1 tablet (0.125 mg total) by mouth every other day. Qty: 90 tablet, Refills: 2    finasteride (PROSCAR) 5 MG tablet Take 1 tablet (5 mg total) by mouth daily. Qty: 90 tablet, Refills: 3   Associated Diagnoses: BPH (benign prostatic hypertrophy) with urinary obstruction    furosemide (LASIX) 20 MG tablet Take 1 tablet (20 mg total) by mouth every other day. Qty: 45 tablet, Refills: 3   Associated Diagnoses: Venous insufficiency of both lower extremities    insulin aspart (NOVOLOG FLEXPEN) 100 UNIT/ML FlexPen Inject 4-8 Units into the skin 3 (three) times daily with meals. Inject 6 units subcutaneously with breakfast, 3-4 units with lunch and 5-6 units with supper    Insulin Pen Needle (B-D UF III MINI PEN NEEDLES) 31G X 5 MM MISC Inject 120 Devices into the skin 4 (four) times daily. Use a new needle for injecting insulin4 times a day Qty: 360 each, Refills: 3   Associated Diagnoses: Type II diabetes mellitus with manifestations (HCC)    levothyroxine (SYNTHROID, LEVOTHROID) 25 MCG tablet Take 1 tablet (25 mcg total) by mouth daily before breakfast. Qty: 30 tablet, Refills: 5    lipase/protease/amylase (CREON) 12000 UNITS CPEP capsule Take 1 capsule (12,000 Units total) by mouth 3 (three) times daily before meals. Qty: 270 capsule, Refills: 3   Associated Diagnoses: Pancreatic insufficiency (HCC)    metoprolol tartrate (LOPRESSOR) 25 MG tablet Take 1 tablet (25 mg total) by mouth 2 (two) times daily. Qty: 180 tablet, Refills: 3    Multiple Vitamins-Minerals (CENTRUM SILVER PO) Take 1 tablet by mouth daily.     Probiotic  Product (PROBIOTIC DAILY PO) Take 3 tablets by mouth daily. 3 tabs once a day with meals.    tamsulosin (FLOMAX) 0.4 MG CAPS capsule Take 1 capsule (0.4 mg total) by mouth 2 (two) times daily. Qty: 180 capsule, Refills: 3   Associated Diagnoses: BPH (benign prostatic hypertrophy) with urinary obstruction      STOP taking these medications     Suvorexant (BELSOMRA) 10 MG TABS      traMADol (ULTRAM) 50 MG tablet        Allergies  Allergen Reactions  . Lisinopril Cough  . Toujeo Solostar [Insulin Glargine] Other (See Comments)    Shaking, felt bad all over  . Clindamycin Diarrhea  . Other Other (See Comments)    toujeo with perioral tingling only - pt refuses to take further  . Oxycodone-Acetaminophen Diarrhea  . Penicillins Hives    Has patient had a PCN reaction causing immediate rash, facial/tongue/throat swelling, SOB or lightheadedness with hypotension: Yes Has patient had a PCN reaction causing severe rash involving mucus membranes or skin necrosis: no  Has patient had a PCN reaction that required hospitalization No Has patient  had a PCN reaction occurring within the last 10 years: No If all of the above answers are "NO", then may proceed with Cephalosporin use.   Follow-up Information    Follow up with [name], MD In 1 week.   Specialty:  Internal Medicine   Why:  Post hospital follow-up   Contact information:   [address] [address] [phone]       Follow up with Gaye Pollack, MD. Schedule an appointment as soon as possible for a visit in 1 week.   Specialty:  Cardiothoracic Surgery   Why:  For wound re-check, Hospital follow up   Contact information:   [address] [place] [address] [phone]        The results of significant diagnostics from this hospitalization (including imaging, microbiology, ancillary and laboratory) are listed below for reference.    Significant Diagnostic Studies: Dg  Chest 2 View  12/10/2015  CLINICAL DATA:  Shortness of breath.  Fever.  Weakness. EXAM: CHEST  2 VIEW COMPARISON:  12/04/2015 chest radiograph. FINDINGS: Catheter fragment appears stable in position in the mediastinum and anterior mid to lower right lung. Right basilar chest tube is stable. Stable cardiomediastinal silhouette with mild cardiomegaly. No pneumothorax. There is interval reaccumulation of a small posterior basilar right pleural effusion. No left pleural effusion. There is new patchy opacity in the mid to lower right lung. There is stable mild patchy opacity in the medial left lung base. No overt pulmonary edema. IMPRESSION: 1. Interval reaccumulation of a small posterior basilar right pleural effusion despite stable position of the basilar right chest tube. No pneumothorax. 2. New patchy opacity in the mid to lower right lung, suspicious for new pneumonia. 3. Stable mild patchy opacity in the medial left lung base, favor atelectasis. 4. Stable mild cardiomegaly without overt pulmonary edema. Electronically Signed   By: Ilona Sorrel M.D.   On: 12/10/2015 17:25   Dg Chest 2 View  12/01/2015  CLINICAL DATA:  Follow up right pleural effusion EXAM: CHEST  2 VIEW COMPARISON:  November 09, 2015 FINDINGS: No pneumothorax. The catheter in the right pulmonary artery is stable. There is a right-sided pleural effusion with underlying opacity, increased in the interval. The effusion is now moderate in size. No other interval changes or acute abnormalities identified. IMPRESSION: Increasing right-sided pleural effusion with underlying opacity. Electronically Signed   By: Dorise Bullion III M.D   On: 12/01/2015 15:18   Dg Chest Port 1 View  12/04/2015  CLINICAL DATA:  Follow-up from right-sided PleurX catheter insertion for recurrent right-sided pneumothorax. EXAM: PORTABLE CHEST 1 VIEW COMPARISON:  Portable chest x-ray of December 02, 2015 FINDINGS: The PleurX catheter is in place on the right with its tip  projecting over the medial cardiophrenic angle. The pleural effusion volume has markedly decreased. There is no pneumothorax. The left lung is well-expanded. There is no left-sided pneumothorax. The heart is mildly enlarged but stable. The central pulmonary vascularity is prominent. The mediastinum is normal in width. A catheter fragment is stable projecting over the right mid hemi thorax. IMPRESSION: There is no postprocedure complication following PleurX catheter placement on the right. The right pleural effusion appears to bemuch smaller and is nearly not visible. Electronically Signed   By: David  Martinique M.D.   On: 12/04/2015 15:38   Dg Chest Port 1 View  12/02/2015  CLINICAL DATA:  Progressive shortness of breath. History of right pleural effusion. EXAM: PORTABLE CHEST  1 VIEW COMPARISON:  Frontal and lateral views yesterday. Chest CT 11/05/2015 FINDINGS: Catheter in the right hemithorax, corresponding to right pulmonary artery on CT unchanged in position. Progressive right pleural effusion and hazy opacity throughout the right hemithorax. Stable blunting of left costophrenic angle. Cardiomediastinal contours are unchanged. No pneumothorax. The cortex of the right proximal humerus appears irregular, only partially included in the field of view. IMPRESSION: 1. Increasing right pleural effusion, moderate to large in size. 2. Question of irregular cortex right proximal humerus, only partially included. Dedicated humerus radiographs recommended, as well as correlation for any pain in this region. Electronically Signed   By: Jeb Levering M.D.   On: 12/02/2015 22:01   Dg C-arm 1-60 Min-no Report  12/04/2015  CLINICAL DATA: pleural catheter insertion C-ARM 1-60 MINUTES Fluoroscopy was utilized by the requesting physician.  No radiographic interpretation.    Microbiology: Recent Results (from the past 240 hour(s))  Culture, blood (routine x 2) Call MD if unable to obtain prior to antibiotics being given      Status: None (Preliminary result)   Collection Time: 12/10/15 11:06 PM  Result Value Ref Range Status   Specimen Description BLOOD LEFT ARM  Final   Special Requests BOTTLES DRAWN AEROBIC AND ANAEROBIC 5ML  Final   Culture NO GROWTH 2 DAYS  Final   Report Status PENDING  Incomplete  Culture, blood (routine x 2) Call MD if unable to obtain prior to antibiotics being given     Status: None (Preliminary result)   Collection Time: 12/10/15 11:13 PM  Result Value Ref Range Status   Specimen Description BLOOD RIGHT ARM  Final   Special Requests BOTTLES DRAWN AEROBIC AND ANAEROBIC 5ML  Final   Culture NO GROWTH 2 DAYS  Final   Report Status PENDING  Incomplete     Labs: Basic Metabolic Panel:  Recent Labs Lab 12/10/15 1730 12/11/15 0646 12/12/15 0546 12/13/15 0611  NA 134* 134* 133* 133*  K 4.5 4.1 4.2 3.8  CL 99* 102 100* 98*  CO2 27 28 26 26   GLUCOSE 184* 148* 122* 127*  BUN 19 16 16 17   CREATININE 1.27* 1.10 1.01 1.08  CALCIUM 8.2* 7.9* 7.9* 7.8*  MG  --  1.7  --   --   PHOS  --  2.5  --   --    Liver Function Tests:  Recent Labs Lab 12/11/15 0646 12/12/15 0546 12/13/15 0611  AST 25 28 35  ALT 20 19 21   ALKPHOS 109 110 106  BILITOT 1.0 0.6 0.7  PROT 4.9* 4.9* 5.0*  ALBUMIN 2.0* 1.9* 1.8*   No results for input(s): LIPASE, AMYLASE in the last 168 hours. No results for input(s): AMMONIA in the last 168 hours. CBC:  Recent Labs Lab 12/10/15 1730 12/11/15 0646  WBC 8.5 6.7  NEUTROABS 5.6 4.2  HGB 12.9* 12.3*  HCT 38.6* 37.1*  MCV 95.3 96.1  PLT 159 145*   Cardiac Enzymes: No results for input(s): CKTOTAL, CKMB, CKMBINDEX, TROPONINI in the last 168 hours. BNP: BNP (last 3 results)  Recent Labs  11/04/15 1312  BNP 469.1*    ProBNP (last 3 results)  Recent Labs  11/04/15 1100  PROBNP 504.0*    CBG:  Recent Labs Lab 12/13/15 0809 12/13/15 1202 12/13/15 1724 12/13/15 2122 12/14/15 0822  GLUCAP 122* 188* 101* 179* 119*    Signed:   Carlena Hurl, PA-S [facility] 12/14/2015, 11:01 AM  Attending MD note  Patient was seen, examined,treatment plan was discussed  with the PA-S.  I have personally reviewed the clinical findings, lab, imaging studies and management of this patient in detail. I agree with the documentation, as recorded by the PA-S   82 year old male admitted with pneumonia-much improved-afebrile-no leukocytosis. Continues to have some mild lingering cough. Lungs are clear to exam. His deconditioned-and weak-seen by PT-recommendations are for SNF.  in's clinically improve, patient is stable for discharge to SNF today.    [facility]

## 2015-12-14 NOTE — Clinical Social Work Note (Signed)
Anell Barr Social Worker Signed Clinical Social Work Clinical Social Work Placement 12/14/2015 6:37 AM    Expand All Collapse All     CLINICAL SOCIAL WORK PLACEMENT NOTE  Date: 12/14/2015  Patient Details  Name: [name] MRN: [id number] Date of Birth: 07/17/1941  Clinical Social Work is seeking post-discharge placement for this patient at the [place] level of care (*CSW will initial, date and re-position this form in chart as items are completed):  Yes Patient/family provided with [facility] Work Department's list of facilities offering this level of care within the geographic area requested by the patient (or if unable, by the patient's family).  Yes Patient/family informed of their freedom to choose among providers that offer the needed level of care, that participate in Medicare, Medicaid or managed care program needed by the patient, have an available bed and are willing to accept the patient.  Yes Patient/family informed of [facility]'s ownership interest in [facility] and [facility], as well as of the fact that they are under no obligation to receive care at these facilities.  PASRR submitted to EDS on 12/14/15   PASRR number received on     Existing PASRR number confirmed on 12/14/15   FL2 transmitted to all facilities in geographic area requested by pt/family on 12/14/15   FL2 transmitted to all facilities within larger geographic area on     Patient informed that his/her managed care company has contracts with or will negotiate with certain facilities, including the following:       Patient/family informed of bed offers received.  Patient chooses bed at     Physician recommends and patient chooses bed at    Patient to be transferred to   on  .  Patient to be transferred to facility by     Patient family notified on   of transfer.  Name of family member notified:      PHYSICIAN Please sign FL2   Additional Comment:    _______________________________________________ Ross Ludwig, LCSWA 12/14/2015, 6:37 AM

## 2015-12-14 NOTE — Care Management Important Message (Signed)
Important Message  Patient Details  Name: [name] MRN: [id number] Date of Birth: [birth year]   Medicare Important Message Given:  Yes    Loann Quill 12/14/2015, 12:46 PM

## 2015-12-14 NOTE — Progress Notes (Signed)
Chest tube drained with Pluerex drain.. Drained 360cc serosanguinous fluid. Dressing changed, cap applied. Pt tolerated very well, no SOB, no pain.

## 2015-12-14 NOTE — Clinical Social Work Note (Addendum)
[name], LCSWA Social Worker Addendum Clinical Social Work Clinical Social Work Note 12/13/2015 3:05 PM    Expand All Collapse All   Clinical Social Work Assessment  Patient Details  Name: [name] MRN: [id number] Date of Birth: [birth year]  Date of referral: 12/13/15   Reason for consult: Facility Placement     Permission sought to share information with: Facility Sport and exercise psychologist, Family Supports Permission granted to share information:: Yes, Verbal Permission Granted Name::  [name], [name] [phone]    Agency:: SNF admissions Relationship::   Contact Information:    Housing/Transportation Living arrangements for the past 2 months: [place] of Information: Patient Patient Interpreter Needed: None Criminal Activity/Legal Involvement Pertinent to Current Situation/Hospitalization: No - Comment as needed Significant Relationships: Spouse Lives with: Spouse Do you feel safe going back to the place where you live? No (Patient feels he needs some short term rehab before he can return back to [place].) Need for family participation in patient care: No (Coment)  Care giving concerns: Patient would like to go to SNF for short term rehab.   Social Worker assessment / plan: Patient is a pleasant 82 year old male who is married and lives with his wife at [facility]. Patient stated that he would like some rehab at a SNF in order to get his strength back up to return back home. Patient states he has not been to SNF for rehab in the past, but he has received home health. Patient is alert and oriented x4 and able to make his own decisions. Patient was explained the process for SNF search and is in agreement to having CSW begin bed search. Patient expressed he did not have any concerns or issues with going to a SNF. Patient was pleasant and  talkative, expressed that CSW can talk to his wife to help with decision making if needed.  Employment status: Retired Forensic scientist: Medicare PT Recommendations: Inpatient [place], Early / Referral to community resources: [place]  Patient/Family's Response to care: Patient in agreement to going to a SNF for short term rehab.  Patient/Family's Understanding of and Emotional Response to Diagnosis, Current Treatment, and Prognosis: Patient aware of current diagnosis and current treatment plan. Patient did not express any concerns.  Emotional Assessment Appearance: Appears stated age Attitude/Demeanor/Rapport:   Affect (typically observed): Appropriate, Calm, Pleasant Orientation: Oriented to Self, Oriented to Place, Oriented to Time, Oriented to Situation Alcohol / Substance use: Not Applicable Psych involvement (Current and /or in the community): No (Comment)  Discharge Needs  Concerns to be addressed: No discharge needs identified Readmission within the last 30 days: Yes (12/03/15 back to [facility]) Current discharge risk: None Barriers to Discharge: No Barriers Identified   [name] 12/14/2015, 6:27 AM       Revision History     Date/Time User Provider Type Action   12/14/2015 6:32 AM [name], LCSWA Social Worker Addend   12/14/2015 6:31 AM [name] Social Worker Sign   View Details Report

## 2015-12-14 NOTE — NC FL2 (Signed)
[place] LEVEL OF CARE SCREENING TOOL     IDENTIFICATION  Patient Name: [name] Birthdate: Jul 19, 1925 Sex: male Admission Date (Current Location): 12/10/2015  [facility] and [place] Number:  Herbalist and Address:  [facility], [place] [address], [place], [place] [address]      Provider Number: [id number]  Attending Physician Name and Address:  Jonetta Osgood, MD  Relative Name and Phone Number:  Maher Laudadio, Spouse [phone]    Current Level of Care: Hospital Recommended Level of Care: [place] Prior Approval Number:    Date Approved/Denied:   PASRR Number: [id number]  Discharge Plan: SNF    Current Diagnoses: Patient Active Problem List   Diagnosis Date Noted  . HAP (hospital-acquired pneumonia) 12/10/2015  . HCAP (healthcare-associated pneumonia) 12/10/2015  . Recurrent pleural effusion on right 12/02/2015  . Chronic systolic CHF (congestive heart failure) ([place]) 12/02/2015  . Insomnia due to anxiety and fear 12/01/2015  . Acute on chronic systolic CHF (congestive heart failure) ([place])   . DOE (dyspnea on exertion) 11/04/2015  . Pleural effusion 11/04/2015  . S/P thoracentesis   . BPH (benign prostatic hypertrophy) with urinary obstruction 06/10/2015  . Pancreatic insufficiency ([place]) 10/28/2014  . Allergic rhinitis due to pollen 10/27/2014  . Anemia of chronic disease 04/25/2014  . Hypothyroid 03/22/2014  . CAD (coronary artery disease) 10/30/2012  . Venous insufficiency of leg 09/11/2012  . Paroxysmal VT-proarrhythmia 2/2 tikosyn 03/04/2012  . NEOP, MALIGNANT, BILIARY DUCTS NEC 09/19/2007  . Type II diabetes mellitus with manifestations ([place]) 09/19/2007  . Persistent atrial fibrillation ([place]) 09/19/2007    Orientation RESPIRATION BLADDER Height & Weight    Self, Time, Situation, Place  Normal Continent 6\' 3"  (190.5 cm) 170 lbs.  BEHAVIORAL SYMPTOMS/MOOD NEUROLOGICAL BOWEL NUTRITION  STATUS      Continent Diet (Carb Modified)  AMBULATORY STATUS COMMUNICATION OF NEEDS Skin   Limited Assist Verbally Normal                       Personal Care Assistance Level of Assistance  Bathing, Dressing Bathing Assistance: Limited assistance   Dressing Assistance: Limited assistance     Functional Limitations Info             SPECIAL CARE FACTORS FREQUENCY  PT (By licensed PT)     PT Frequency: 3x a week              Contractures      Additional Factors Info  Allergies, Insulin Sliding Scale   Allergies Info: LISINOPRIL, TOUJEO SOLOSTAR, CLINDAMYCIN, OTHER, OXYCODONE-ACETAMINOPHEN, PENICILLINS   Insulin Sliding Scale Info: 3x a day       Current Medications (12/14/2015):  This is the current hospital active medication list Current Facility-Administered Medications  Medication Dose Route Frequency Provider Last Rate Last Dose  . 0.9 %  sodium chloride infusion  250 mL Intravenous PRN Reubin Milan, MD      . amiodarone (PACERONE) tablet 200 mg  200 mg Oral BID Reubin Milan, MD   200 mg at 12/13/15 2115  . apixaban (ELIQUIS) tablet 2.5 mg  2.5 mg Oral BID Reubin Milan, MD   2.5 mg at 12/13/15 2115  . azelastine (ASTELIN) 0.1 % nasal spray 2 spray  2 spray Each Nare BID Reubin Milan, MD   2 spray at 12/13/15 2200  . digoxin (LANOXIN) tablet 0.125 mg  0.125 mg Oral QODAY Reubin Milan,  MD   0.125 mg at 12/12/15 1004  . finasteride (PROSCAR) tablet 5 mg  5 mg Oral Daily Reubin Milan, MD   5 mg at 12/13/15 0940  . furosemide (LASIX) tablet 20 mg  20 mg Oral QODAY Reubin Milan, MD   20 mg at 12/12/15 1007  . insulin aspart (novoLOG) injection 0-9 Units  0-9 Units Subcutaneous TID WC Jonetta Osgood, MD   2 Units at 12/13/15 1307  . ipratropium (ATROVENT) nebulizer solution 0.5 mg  0.5 mg Nebulization Q6H PRN Reubin Milan, MD      . levalbuterol [facility]) nebulizer solution 1.25 mg  1.25 mg Nebulization Q6H PRN  Reubin Milan, MD      . levofloxacin [facility]) IVPB 750 mg  750 mg Intravenous Q24H Reubin Milan, MD   750 mg at 12/13/15 2115  . levothyroxine (SYNTHROID, LEVOTHROID) tablet 25 mcg  25 mcg Oral QAC breakfast Reubin Milan, MD   25 mcg at 12/13/15 0753  . lipase/protease/amylase (CREON) capsule 12,000 Units  12,000 Units Oral TID [facility] Reubin Milan, MD   12,000 Units at 12/13/15 1751  . metoprolol tartrate (LOPRESSOR) tablet 25 mg  25 mg Oral BID Reubin Milan, MD   25 mg at 12/13/15 2115  . ondansetron (ZOFRAN) tablet 4 mg  4 mg Oral Q6H PRN Reubin Milan, MD       Or  . ondansetron [facility]) injection 4 mg  4 mg Intravenous Q6H PRN Reubin Milan, MD      . pantoprazole (PROTONIX) EC tablet 40 mg  40 mg Oral Daily Reubin Milan, MD   40 mg at 12/13/15 0939  . sodium chloride 0.9 % injection 3 mL  3 mL Intravenous Q12H Reubin Milan, MD   3 mL at 12/13/15 2200  . sodium chloride 0.9 % injection 3 mL  3 mL Intravenous Q12H Reubin Milan, MD   3 mL at 12/13/15 2200  . sodium chloride 0.9 % injection 3 mL  3 mL Intravenous PRN Reubin Milan, MD      . tamsulosin [facility]) capsule 0.4 mg  0.4 mg Oral BID Reubin Milan, MD   0.4 mg at 12/13/15 2115  . vitamin C (ASCORBIC ACID) tablet 500 mg  500 mg Oral Daily Reubin Milan, MD   500 mg at 12/13/15 0940     Discharge Medications: Please see discharge summary for a list of discharge medications.  Relevant Imaging Results:  Relevant Lab Results:   Additional Information    Gianluca Chhim, [place]room, LCSWA

## 2015-12-15 LAB — GLUCOSE, CAPILLARY
Glucose-Capillary: 157 mg/dL — ABNORMAL HIGH (ref 65–99)
Glucose-Capillary: 138 mg/dL — ABNORMAL HIGH (ref 65–99)
Glucose-Capillary: 189 mg/dL — ABNORMAL HIGH (ref 65–99)

## 2015-12-15 MED ORDER — METOPROLOL TARTRATE 25 MG PO TABS
25.0000 mg | ORAL_TABLET | Freq: Two times a day (BID) | ORAL | Status: DC
  Administered 2015-12-15: 25 mg via ORAL
  Filled 2015-12-15: qty 1

## 2015-12-15 MED ORDER — LEVOFLOXACIN 750 MG PO TABS: 750.0000 mg | ORAL_TABLET | Freq: Every evening | ORAL | Status: DC

## 2015-12-15 NOTE — Progress Notes (Signed)
Patient's son in law came to me about the patient not having a bath since he has been here.  Discussed this issue with the NT Vickie and asked her to see him next for a bath before being d/c to [facility]

## 2015-12-15 NOTE — Progress Notes (Signed)
Patient will DC to: [name] Anticipated DC date: 12/15/15 Family notified: Wife, Copy by: PTAR  CSW signing off.  Cedric Fishman, [place] Social Worker [phone]

## 2015-12-15 NOTE — Progress Notes (Signed)
Report given to Stephanie at [facility].  

## 2015-12-15 NOTE — Clinical Social Work Placement (Signed)
   CLINICAL SOCIAL WORK PLACEMENT  NOTE  Date:  12/15/2015  Patient Details  Name: [name] MRN: [id number] Date of Birth: 11/02/1925  Clinical Social Work is seeking post-discharge placement for this patient at the [place] level of care (*CSW will initial, date and re-position this form in  chart as items are completed):  Yes   Patient/family provided with [place] Work Department's list of facilities offering this level of care within the geographic area requested by the patient (or if unable, by the patient's family).  Yes   Patient/family informed of their freedom to choose among providers that offer the needed level of care, that participate in Medicare, Medicaid or managed care program needed by the patient, have an available bed and are willing to accept the patient.  Yes   Patient/family informed of [place]'s ownership interest in [facility] and [facility], as well as of the fact that they are under no obligation to receive care at these facilities.  PASRR submitted to EDS on 12/14/15     PASRR number received on 12/14/15     Existing PASRR number confirmed on       FL2 transmitted to all facilities in geographic area requested by pt/family on 12/14/15     FL2 transmitted to all facilities within larger geographic area on       Patient informed that his/her managed care company has contracts with or will negotiate with certain facilities, including the following:        Yes   Patient/family informed of bed offers received.  Patient chooses bed at [facility]     Physician recommends and patient chooses bed at      Patient to be transferred to [facility] on 12/15/15.  Patient to be transferred to facility by PTAR     Patient family notified on 12/15/15 of transfer.  Name of family member notified:  Jonni Sanger, son-in-law     PHYSICIAN Please prepare prescriptions     Additional Comment:     _______________________________________________ Benard Halsted, [place] 12/15/2015, 12:29 PM

## 2015-12-15 NOTE — Progress Notes (Signed)
Seen and examined, remained stable. Afebrile. Still feels weak. Lungs: Decreased air entry on the right base-but otherwise clear. Stable for discharge to SNF He does not want me to talk to his family/son-in-law-he wants to go test placed today. Note-patient is awake and alert.

## 2015-12-15 NOTE — Progress Notes (Signed)
PT Cancellation Note  Patient Details Name: [name] MRN: [id number] DOB: 1925-04-29   Cancelled Treatment:    Reason Eval/Treat Not Completed: Patient declined, no reason specified (Awaiting transport to SNF).  Will try later if pt remains in building.   Ramond Dial 12/15/2015, 12:45 PM   Mee Hives, PT MS Acute Rehab Dept. Number: [facility] [id number] and [place] [phone]

## 2015-12-15 NOTE — Progress Notes (Signed)
[name] to be D/C'd Skilled nursing facility per MD order.  Discussed with the patient and all questions fully answered.  Escorted via PTAR to [facility]  [place], Augustine Leverette C 12/15/2015 2:50 PM

## 2015-12-16 ENCOUNTER — Telehealth: Payer: Self-pay | Admitting: *Deleted

## 2015-12-16 LAB — CULTURE, BLOOD (ROUTINE X 2)
Culture: NO GROWTH
Culture: NO GROWTH

## 2015-12-16 NOTE — Telephone Encounter (Signed)
Called pt to get TCM appt set-up no answer LMOM RTC...[name]

## 2015-12-17 ENCOUNTER — Encounter: Payer: Self-pay | Admitting: Internal Medicine

## 2015-12-17 ENCOUNTER — Non-Acute Institutional Stay (SKILLED_NURSING_FACILITY): Payer: Medicare Other | Admitting: Internal Medicine

## 2015-12-17 ENCOUNTER — Ambulatory Visit: Payer: Medicare Other | Admitting: Internal Medicine

## 2015-12-17 DIAGNOSIS — I482 Chronic atrial fibrillation, unspecified: Secondary | ICD-10-CM

## 2015-12-17 DIAGNOSIS — J90 Pleural effusion, not elsewhere classified: Secondary | ICD-10-CM | POA: Diagnosis not present

## 2015-12-17 DIAGNOSIS — E039 Hypothyroidism, unspecified: Secondary | ICD-10-CM | POA: Diagnosis not present

## 2015-12-17 DIAGNOSIS — J189 Pneumonia, unspecified organism: Secondary | ICD-10-CM

## 2015-12-17 DIAGNOSIS — R5381 Other malaise: Secondary | ICD-10-CM | POA: Diagnosis not present

## 2015-12-17 DIAGNOSIS — N401 Enlarged prostate with lower urinary tract symptoms: Secondary | ICD-10-CM | POA: Diagnosis not present

## 2015-12-17 DIAGNOSIS — E871 Hypo-osmolality and hyponatremia: Secondary | ICD-10-CM | POA: Diagnosis not present

## 2015-12-17 DIAGNOSIS — E46 Unspecified protein-calorie malnutrition: Secondary | ICD-10-CM

## 2015-12-17 DIAGNOSIS — D638 Anemia in other chronic diseases classified elsewhere: Secondary | ICD-10-CM

## 2015-12-17 DIAGNOSIS — E118 Type 2 diabetes mellitus with unspecified complications: Secondary | ICD-10-CM | POA: Diagnosis not present

## 2015-12-17 DIAGNOSIS — E038 Other specified hypothyroidism: Secondary | ICD-10-CM | POA: Diagnosis not present

## 2015-12-17 DIAGNOSIS — I5022 Chronic systolic (congestive) heart failure: Secondary | ICD-10-CM

## 2015-12-17 DIAGNOSIS — N138 Other obstructive and reflux uropathy: Secondary | ICD-10-CM

## 2015-12-17 NOTE — Telephone Encounter (Signed)
Wife called in stating patient has been transferred to [facility].  Not sure how long patient will be there.

## 2015-12-17 NOTE — Progress Notes (Deleted)
Patient ID: [name], male   DOB: December 14, 1924, 82 y.o.   MRN: [id number]    Location:  [facility]  Place of Service: SNF (31)  PCP: [name], MD  Code Status: Full Code  Extended Emergency Contact Information Primary Emergency Contact: Weissinger,Nancy Address: [place], [place] Phone: [phone] Relation: Spouse Secondary Emergency Contact: [place] of [place] Mobile Phone: [phone] Relation: Daughter  Allergies  Allergen Reactions  . Lisinopril Cough  . Toujeo Solostar [Insulin Glargine] Other (See Comments)    Shaking, felt bad all over  . Clindamycin Diarrhea  . Other Other (See Comments)    toujeo with perioral tingling only - pt refuses to take further  . Oxycodone-Acetaminophen Diarrhea  . Penicillins Hives    Has patient had a PCN reaction causing immediate rash, facial/tongue/throat swelling, SOB or lightheadedness with hypotension: Yes Has patient had a PCN reaction causing severe rash involving mucus membranes or skin necrosis: no  Has patient had a PCN reaction that required hospitalization No Has patient had a PCN reaction occurring within the last 10 years: No If all of the above answers are "NO", then may proceed with Cephalosporin use.    Chief Complaint  Patient presents with  . New Admit To SNF    New admission to facility    HPI:  ***    Past Medical History  Diagnosis Date  . Acid reflux disease     history of  . H/O: GI bleed   . Persistent atrial fibrillation ([place])     a. Failed tikosyn. b. DCCV did not hold 07/2015.  Marland Kitchen Benign prostatic hypertrophy     history of  . Hepatic damage march 2009    retained hepatic stone , intrahepatic duct catheter  . CAD (coronary artery disease)     a. R/LHC 11/09/15: mild nonobstructive CAD (NICM) with well- compensated hemodynamics with low filling pressures.  . Hypertension   . 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 Seizures ([place])   . Ventricular tachycardia ? Tikosyn proarrhtyhmia   . Cataracts, bilateral   . Diabetes mellitus     insulin dep  . Pleural effusion 10/2015  . Acute on chronic systolic CHF (congestive heart failure) ([place])   . CKD (chronic kidney disease), stage II   . HCAP (healthcare-associated pneumonia) 12/10/2015  . Hypothyroid   . Pancreatic insufficiency ([place])   . Systolic CHF [facility])     Past Surgical History  Procedure Laterality Date  . Cataract extraction w/ intraocular lens  implant, bilateral Bilateral ~ 2014  . Cholecystectomy    . Partial gastrectomy    . Orif shoulder fracture Left 1941    2 pens inserted  . Whipple procedure      operation  . Tumor excision Right     sarcoma removal from tricep  . Inguinal hernia repair Right X 2  . Appendectomy    . Cardioversion  03/07/2012    Procedure: CARDIOVERSION;  Surgeon: Deboraha Sprang, MD;  Location: [place];  Service: Cardiovascular;  Laterality: N/A;  . Cardioversion N/A 08/10/2015    Procedure: CARDIOVERSION;  Surgeon: Thayer Headings, MD;  Location: [place];  Service: Cardiovascular;  Laterality: N/A;  .  Cardiac catheterization N/A 11/09/2015    Procedure: Right/Left Heart Cath and Coronary Angiography;  Surgeon: Jolaine Artist, MD;  Location: [place] [facility];  Service: Cardiovascular;  Laterality: N/A;  . Chest tube insertion Right 12/04/2015    Procedure: INSERTION RIGHT PLEURAL DRAINAGE CATHETER;  Surgeon: Gaye Pollack, MD;  Location: [place] [facility];  Service: Thoracic;  Laterality: Right;  . Fracture surgery    . Thoracentesis      CONSULTANTS ***  PAST PROCEDURES ***  Social History: Social History   Social History  . Marital Status: Married    Spouse Name: N/A  . Number of Children: 3  . Years of Education: N/A    Occupational History  . 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     Social History Main Topics  . Smoking status: Former Smoker    Types: Pipe    Quit date: 03/14/1991  . Smokeless tobacco: Never Used  . Alcohol Use: No  . Drug Use: No  . Sexual Activity: Not Currently   Other Topics Concern  . None   Social History Narrative   Dispensing optician. married 1954. 2 sons - [place]; 1 daughter 16. 7 grandchildren. retired- Training and development officer.  End of Life Care: DNR, no prolonged intubation (more than 2-3 days), no sustaining tube feeding, no futile or heroic measures. (Provided signed Out of Facility order and unsigned MOST form 6/112/12)    Family History Family Status  Relation Status Death Age  . Father Deceased 34  . Mother Deceased 46  . Brother Alive   . Sister Deceased     complications  . Sister Alive   . Sister Alive   . Brother Deceased 86    cancer-liver  . Sister Deceased 26  . Daughter      8  . Son      61  . Son      20   Family History  Problem Relation Age of Onset  . Coronary artery disease Father 36  . Heart disease Father     fatal MI  . Osteoarthritis Mother 33  . Arthritis Mother   . Coronary artery disease Brother     cabg, avr, pvd with sents  . Heart disease Brother     CABG, PVD  . Peripheral vascular disease Sister 5  . Fibromyalgia Sister   . Arthritis Sister   . Coronary artery disease Sister   . Diabetes Sister   . Cancer Brother      Medications: Patient's Medications  New Prescriptions   No medications on file  Previous Medications   AMIODARONE (PACERONE) 200 MG TABLET    Take 1 tablet (200 mg total) by mouth 2 (two) times daily.   APIXABAN (ELIQUIS) 2.5 MG TABS TABLET    Take 1 tablet (2.5 mg total) by mouth 2 (two) times daily.   ASCORBIC ACID (VITAMIN C) 500 MG TABLET    Take 500 mg by mouth daily.    AZELASTINE (ASTELIN) 0.1 % NASAL SPRAY    Place 2 sprays into both nostrils 2 (two) times  daily. Use in each nostril as directed   DIGOXIN (LANOXIN) 0.125 MG TABLET    Take 1 tablet (0.125 mg total) by mouth every other day.   FINASTERIDE (PROSCAR) 5 MG TABLET    Take 1 tablet (5 mg total) by mouth daily.   FUROSEMIDE (LASIX) 20 MG TABLET    Take 1 tablet (20 mg  total) by mouth every other day.   INSULIN ASPART (NOVOLOG FLEXPEN) 100 UNIT/ML FLEXPEN    Inject 5 Units into the skin daily. With supper   INSULIN ASPART (NOVOLOG FLEXPEN) 100 UNIT/ML FLEXPEN    Inject 6 Units into the skin daily. With breakfast   INSULIN ASPART (NOVOLOG FLEXPEN) 100 UNIT/ML FLEXPEN    Inject 3 Units into the skin daily. With lunch   LEVOFLOXACIN (LEVAQUIN) 750 MG TABLET    Take 750 mg by mouth 2 (two) times daily.   LEVOTHYROXINE (SYNTHROID, LEVOTHROID) 25 MCG TABLET    Take 1 tablet (25 mcg total) by mouth daily before breakfast.   LIPASE/PROTEASE/AMYLASE (CREON) 12000 UNITS CPEP CAPSULE    Take 1 capsule (12,000 Units total) by mouth 3 (three) times daily before meals.   METOPROLOL TARTRATE (LOPRESSOR) 25 MG TABLET    Take 1 tablet (25 mg total) by mouth 2 (two) times daily.   MISC NATURAL PRODUCTS (GLUCOSAMINE CHONDROITIN MSM PO)    Take 1 tablet by mouth daily. Glucosamine-chondroitin 500-400 MG   MULTIPLE VITAMINS-MINERALS (CENTRUM SILVER PO)    Take 1 tablet by mouth daily.    PROBIOTIC PRODUCT (PROBIOTIC DAILY PO)    Take 1 tablet by mouth 2 (two) times daily. With meals   TAMSULOSIN (FLOMAX) 0.4 MG CAPS CAPSULE    Take 1 capsule (0.4 mg total) by mouth 2 (two) times daily.  Modified Medications   No medications on file  Discontinued Medications   [facility] GLUCOSAMINE-CHONDROITIN PO    Take 1 tablet by mouth daily. 1200/600 mg   INSULIN ASPART (NOVOLOG FLEXPEN) 100 UNIT/ML FLEXPEN    Inject 4-8 Units into the skin 3 (three) times daily with meals. Inject 6 units subcutaneously with breakfast, 3-4 units with lunch and 5-6 units with supper   INSULIN PEN NEEDLE (B-D UF III MINI PEN NEEDLES) 31G X 5 MM MISC     Inject 120 Devices into the skin 4 (four) times daily. Use a new needle for injecting insulin4 times a day    Immunization History  Administered Date(s) Administered  . Influenza Split 09/28/2012  . Influenza Whole 08/29/2009  . Influenza,inj,Quad PF,36+ Mos 08/28/2014  . Influenza-Unspecified 09/03/2013, 09/03/2015, 12/15/2015  . PPD Test 12/15/2015  . Pneumococcal Conjugate-13 12/02/2013  . Pneumococcal Polysaccharide-23 02/25/2015  . Tdap 10/30/2012     Review of Systems    Filed Vitals:   12/17/15 0914  BP: 126/80  Pulse: 74  Temp: 97.2 F (36.2 C)  TempSrc: Oral  Resp: 20  Height: _0  (1.905 m)  Weight: 163 lb 14 oz (74.333 kg)  SpO2: 98%   Body mass index is 20.48 kg/(m^2).  Physical Exam      Labs reviewed: Admission on 12/10/2015, Discharged on 12/15/2015  Component Date Value Ref Range Status  . WBC 12/10/2015 8.5  4.0 - 10.5 K/uL Final  . RBC 12/10/2015 4.05* 4.22 - 5.81 MIL/uL Final  . Hemoglobin 12/10/2015 12.9* 13.0 - 17.0 g/dL Final  . HCT 12/10/2015 38.6* 39.0 - 52.0 % Final  . MCV 12/10/2015 95.3  78.0 - 100.0 fL Final  . MCH 12/10/2015 31.9  26.0 - 34.0 pg Final  . MCHC 12/10/2015 33.4  30.0 - 36.0 g/dL Final  . RDW 12/10/2015 14.1  11.5 - 15.5 % Final  . Platelets 12/10/2015 159  150 - 400 K/uL Final  . Neutrophils Relative % 12/10/2015 66   Final  . Neutro Abs 12/10/2015 5.6  1.7 - 7.7 K/uL Final  . Lymphocytes  Relative 12/10/2015 10   Final  . Lymphs Abs 12/10/2015 0.9  0.7 - 4.0 K/uL Final  . Monocytes Relative 12/10/2015 23   Final  . Monocytes Absolute 12/10/2015 2.0* 0.1 - 1.0 K/uL Final  . Eosinophils Relative 12/10/2015 1   Final  . Eosinophils Absolute 12/10/2015 0.1  0.0 - 0.7 K/uL Final  . Basophils Relative 12/10/2015 0   Final  . Basophils Absolute 12/10/2015 0.0  0.0 - 0.1 K/uL Final  . Sodium 12/10/2015 134* 135 - 145 mmol/L Final  . Potassium 12/10/2015 4.5  3.5 - 5.1 mmol/L Final  . Chloride 12/10/2015 99* 101 -  111 mmol/L Final  . CO2 12/10/2015 27  22 - 32 mmol/L Final  . Glucose, Bld 12/10/2015 184* 65 - 99 mg/dL Final  . BUN 12/10/2015 19  6 - 20 mg/dL Final  . Creatinine, Ser 12/10/2015 1.27* 0.61 - 1.24 mg/dL Final  . Calcium 12/10/2015 8.2* 8.9 - 10.3 mg/dL Final  . GFR calc non Af Amer 12/10/2015 49* >60 mL/min Final  . GFR calc Af Amer 12/10/2015 56* >60 mL/min Final   Comment: (NOTE) The eGFR has been calculated using the CKD EPI equation. This calculation has not been validated in all clinical situations. eGFR's persistently <60 mL/min signify possible Chronic Kidney Disease.   . Anion gap 12/10/2015 8  5 - 15 Final  . Color, Urine 12/10/2015 AMBER* YELLOW Final   BIOCHEMICALS MAY BE AFFECTED BY COLOR  . APPearance 12/10/2015 CLEAR  CLEAR Final  . Specific Gravity, Urine 12/10/2015 1.019  1.005 - 1.030 Final  . pH 12/10/2015 5.0  5.0 - 8.0 Final  . Glucose, UA 12/10/2015 NEGATIVE  NEGATIVE mg/dL Final  . Hgb urine dipstick 12/10/2015 NEGATIVE  NEGATIVE Final  . Bilirubin Urine 12/10/2015 NEGATIVE  NEGATIVE Final  . Ketones, ur 12/10/2015 NEGATIVE  NEGATIVE mg/dL Final  . Protein, ur 12/10/2015 NEGATIVE  NEGATIVE mg/dL Final  . Nitrite 12/10/2015 NEGATIVE  NEGATIVE Final  . Leukocytes, UA 12/10/2015 NEGATIVE  NEGATIVE Final   MICROSCOPIC NOT DONE ON URINES WITH NEGATIVE PROTEIN, BLOOD, LEUKOCYTES, NITRITE, OR GLUCOSE <1000 mg/dL.  . Troponin i, poc 12/10/2015 0.05  0.00 - 0.08 ng/mL Final  . Comment 3 12/10/2015          Final   Comment: Due to the release kinetics of cTnI, a negative result within the first hours of the onset of symptoms does not rule out myocardial infarction with certainty. If myocardial infarction is still suspected, repeat the test at appropriate intervals.   Marland Kitchen Specimen Description 12/10/2015 BLOOD RIGHT ARM   Final  . Special Requests 12/10/2015 BOTTLES DRAWN AEROBIC AND ANAEROBIC 5ML   Final  . Culture 12/10/2015 NO GROWTH 5 DAYS   Final  . Report  Status 12/10/2015 12/16/2015 FINAL   Final  . Specimen Description 12/10/2015 BLOOD LEFT ARM   Final  . Special Requests 12/10/2015 BOTTLES DRAWN AEROBIC AND ANAEROBIC 5ML   Final  . Culture 12/10/2015 NO GROWTH 5 DAYS   Final  . Report Status 12/10/2015 12/16/2015 FINAL   Final  . Strep Pneumo Urinary Antigen 12/10/2015 NEGATIVE  NEGATIVE Final   Comment:        Infection due to S. pneumoniae cannot be absolutely ruled out since the antigen present may be below the detection limit of the test.   . WBC 12/11/2015 6.7  4.0 - 10.5 K/uL Final  . RBC 12/11/2015 3.86* 4.22 - 5.81 MIL/uL Final  . Hemoglobin 12/11/2015 12.3* 13.0 -  17.0 g/dL Final  . HCT 12/11/2015 37.1* 39.0 - 52.0 % Final  . MCV 12/11/2015 96.1  78.0 - 100.0 fL Final  . MCH 12/11/2015 31.9  26.0 - 34.0 pg Final  . MCHC 12/11/2015 33.2  30.0 - 36.0 g/dL Final  . RDW 12/11/2015 14.1  11.5 - 15.5 % Final  . Platelets 12/11/2015 145* 150 - 400 K/uL Final  . Neutrophils Relative % 12/11/2015 64   Final  . Neutro Abs 12/11/2015 4.2  1.7 - 7.7 K/uL Final  . Lymphocytes Relative 12/11/2015 11   Final  . Lymphs Abs 12/11/2015 0.7  0.7 - 4.0 K/uL Final  . Monocytes Relative 12/11/2015 22   Final  . Monocytes Absolute 12/11/2015 1.5* 0.1 - 1.0 K/uL Final  . Eosinophils Relative 12/11/2015 3   Final  . Eosinophils Absolute 12/11/2015 0.2  0.0 - 0.7 K/uL Final  . Basophils Relative 12/11/2015 0   Final  . Basophils Absolute 12/11/2015 0.0  0.0 - 0.1 K/uL Final  . Sodium 12/11/2015 134* 135 - 145 mmol/L Final  . Potassium 12/11/2015 4.1  3.5 - 5.1 mmol/L Final  . Chloride 12/11/2015 102  101 - 111 mmol/L Final  . CO2 12/11/2015 28  22 - 32 mmol/L Final  . Glucose, Bld 12/11/2015 148* 65 - 99 mg/dL Final  . BUN 12/11/2015 16  6 - 20 mg/dL Final  . Creatinine, Ser 12/11/2015 1.10  0.61 - 1.24 mg/dL Final  . Calcium 12/11/2015 7.9* 8.9 - 10.3 mg/dL Final  . Total Protein 12/11/2015 4.9* 6.5 - 8.1 g/dL Final  . Albumin 12/11/2015  2.0* 3.5 - 5.0 g/dL Final  . AST 12/11/2015 25  15 - 41 U/L Final  . ALT 12/11/2015 20  17 - 63 U/L Final  . Alkaline Phosphatase 12/11/2015 109  38 - 126 U/L Final  . Total Bilirubin 12/11/2015 1.0  0.3 - 1.2 mg/dL Final  . GFR calc non Af Amer 12/11/2015 58* >60 mL/min Final  . GFR calc Af Amer 12/11/2015 >60  >60 mL/min Final   Comment: (NOTE) The eGFR has been calculated using the CKD EPI equation. This calculation has not been validated in all clinical situations. eGFR's persistently <60 mL/min signify possible Chronic Kidney Disease.   . Anion gap 12/11/2015 4* 5 - 15 Final  . Magnesium 12/11/2015 1.7  1.7 - 2.4 mg/dL Final  . Phosphorus 12/11/2015 2.5  2.5 - 4.6 mg/dL Final  . Specimen Description 12/10/2015 URINE, CLEAN CATCH   Final  . Special Requests 12/10/2015 NONE   Final  . Legionella Antigen, Urine 12/10/2015    Final                   Value:Negative for Legionella pneumophila serogroup 1                                                              Legionella pneumophila serogroup 1 antigen can be detected in urine within 2 to 3 days of infection and may persist even after treatment. This  assay does not detect other Legionella species or serogroups. Performed at [facility]   . Report Status 12/10/2015 12/11/2015 FINAL   Final  . Lactic Acid, Venous 12/10/2015 0.9  0.5 - 2.0 mmol/L Final  . Procalcitonin 12/10/2015 0.17  Final   Comment:        Interpretation: PCT (Procalcitonin) <= 0.5 ng/mL: Systemic infection (sepsis) is not likely. Local bacterial infection is possible. (NOTE)         ICU PCT Algorithm               Non ICU PCT Algorithm    ----------------------------     ------------------------------         PCT < 0.25 ng/mL                 PCT < 0.1 ng/mL     Stopping of antibiotics            Stopping of antibiotics       strongly encouraged.               strongly encouraged.    ----------------------------     ------------------------------        PCT level decrease by               PCT < 0.25 ng/mL       >= 80% from peak PCT       OR PCT 0.25 - 0.5 ng/mL          Stopping of antibiotics                                             encouraged.     Stopping of antibiotics           encouraged.    ----------------------------     ------------------------------       PCT level decrease by              PCT >= 0.25 ng/mL       < 80% from peak PCT        AND PCT >= 0.5 ng/mL            Continuin                          g antibiotics                                              encouraged.       Continuing antibiotics            encouraged.    ----------------------------     ------------------------------     PCT level increase compared          PCT > 0.5 ng/mL         with peak PCT AND          PCT >= 0.5 ng/mL             Escalation of antibiotics                                          strongly encouraged.      Escalation of antibiotics        strongly encouraged.   . Prothrombin Time 12/10/2015 17.4* 11.6 - 15.2 seconds Final  . INR 12/10/2015 1.42  0.00 - 1.49 Final  . aPTT 12/10/2015  40* 24 - 37 seconds Final   Comment:        IF BASELINE aPTT IS ELEVATED, SUGGEST PATIENT RISK ASSESSMENT BE USED TO DETERMINE APPROPRIATE ANTICOAGULANT THERAPY.   . Glucose-Capillary 12/10/2015 132* 65 - 99 mg/dL Final  . Comment 1 12/10/2015 Notify RN   Final  . Comment 2 12/10/2015 Document in Chart   Final  . Lactic Acid, Venous 12/11/2015 0.8  0.5 - 2.0 mmol/L Final  . Glucose-Capillary 12/11/2015 132* 65 - 99 mg/dL Final  . Sodium 12/12/2015 133* 135 - 145 mmol/L Final  . Potassium 12/12/2015 4.2  3.5 - 5.1 mmol/L Final  . Chloride 12/12/2015 100* 101 - 111 mmol/L Final  . CO2 12/12/2015 26  22 - 32 mmol/L Final  . Glucose, Bld 12/12/2015 122* 65 - 99 mg/dL Final  . BUN 12/12/2015 16  6 - 20 mg/dL Final  . Creatinine, Ser 12/12/2015 1.01  0.61 - 1.24 mg/dL Final  . Calcium 12/12/2015 7.9* 8.9 - 10.3 mg/dL Final  . Total  Protein 12/12/2015 4.9* 6.5 - 8.1 g/dL Final  . Albumin 12/12/2015 1.9* 3.5 - 5.0 g/dL Final  . AST 12/12/2015 28  15 - 41 U/L Final  . ALT 12/12/2015 19  17 - 63 U/L Final  . Alkaline Phosphatase 12/12/2015 110  38 - 126 U/L Final  . Total Bilirubin 12/12/2015 0.6  0.3 - 1.2 mg/dL Final  . GFR calc non Af Amer 12/12/2015 >60  >60 mL/min Final  . GFR calc Af Amer 12/12/2015 >60  >60 mL/min Final   Comment: (NOTE) The eGFR has been calculated using the CKD EPI equation. This calculation has not been validated in all clinical situations. eGFR's persistently <60 mL/min signify possible Chronic Kidney Disease.   . Anion gap 12/12/2015 7  5 - 15 Final  . Glucose-Capillary 12/11/2015 199* 65 - 99 mg/dL Final  . Glucose-Capillary 12/12/2015 136* 65 - 99 mg/dL Final  . Glucose-Capillary 12/12/2015 165* 65 - 99 mg/dL Final  . Glucose-Capillary 12/12/2015 152* 65 - 99 mg/dL Final  . Sodium 12/13/2015 133* 135 - 145 mmol/L Final  . Potassium 12/13/2015 3.8  3.5 - 5.1 mmol/L Final  . Chloride 12/13/2015 98* 101 - 111 mmol/L Final  . CO2 12/13/2015 26  22 - 32 mmol/L Final  . Glucose, Bld 12/13/2015 127* 65 - 99 mg/dL Final  . BUN 12/13/2015 17  6 - 20 mg/dL Final  . Creatinine, Ser 12/13/2015 1.08  0.61 - 1.24 mg/dL Final  . Calcium 12/13/2015 7.8* 8.9 - 10.3 mg/dL Final  . Total Protein 12/13/2015 5.0* 6.5 - 8.1 g/dL Final  . Albumin 12/13/2015 1.8* 3.5 - 5.0 g/dL Final  . AST 12/13/2015 35  15 - 41 U/L Final  . ALT 12/13/2015 21  17 - 63 U/L Final  . Alkaline Phosphatase 12/13/2015 106  38 - 126 U/L Final  . Total Bilirubin 12/13/2015 0.7  0.3 - 1.2 mg/dL Final  . GFR calc non Af Amer 12/13/2015 59* >60 mL/min Final  . GFR calc Af Amer 12/13/2015 >60  >60 mL/min Final   Comment: (NOTE) The eGFR has been calculated using the CKD EPI equation. This calculation has not been validated in all clinical situations. eGFR's persistently <60 mL/min signify possible Chronic Kidney Disease.   .  Anion gap 12/13/2015 9  5 - 15 Final  . Glucose-Capillary 12/12/2015 112* 65 - 99 mg/dL Final  . Glucose-Capillary 12/13/2015 122* 65 - 99 mg/dL Final  . Glucose-Capillary 12/13/2015 188* 65 -  99 mg/dL Final  . Glucose-Capillary 12/13/2015 101* 65 - 99 mg/dL Final  . Glucose-Capillary 12/13/2015 179* 65 - 99 mg/dL Final  . Comment 1 12/13/2015 Notify RN   Final  . Comment 2 12/13/2015 Document in Chart   Final  . Glucose-Capillary 12/14/2015 119* 65 - 99 mg/dL Final  . Glucose-Capillary 12/14/2015 145* 65 - 99 mg/dL Final  . Glucose-Capillary 12/14/2015 152* 65 - 99 mg/dL Final  . Glucose-Capillary 12/14/2015 157* 65 - 99 mg/dL Final  . Glucose-Capillary 12/15/2015 138* 65 - 99 mg/dL Final  . Glucose-Capillary 12/15/2015 189* 65 - 99 mg/dL Final  Admission on 12/02/2015, Discharged on 12/06/2015  Component Date Value Ref Range Status  . Sodium 12/02/2015 140  135 - 145 mmol/L Final  . Potassium 12/02/2015 5.0  3.5 - 5.1 mmol/L Final  . Chloride 12/02/2015 107  101 - 111 mmol/L Final  . CO2 12/02/2015 25  22 - 32 mmol/L Final  . Glucose, Bld 12/02/2015 138* 65 - 99 mg/dL Final  . BUN 12/02/2015 19  6 - 20 mg/dL Final  . Creatinine, Ser 12/02/2015 1.30* 0.61 - 1.24 mg/dL Final  . Calcium 12/02/2015 8.7* 8.9 - 10.3 mg/dL Final  . GFR calc non Af Amer 12/02/2015 47* >60 mL/min Final  . GFR calc Af Amer 12/02/2015 55* >60 mL/min Final   Comment: (NOTE) The eGFR has been calculated using the CKD EPI equation. This calculation has not been validated in all clinical situations. eGFR's persistently <60 mL/min signify possible Chronic Kidney Disease.   . Anion gap 12/02/2015 8  5 - 15 Final  . WBC 12/02/2015 5.9  4.0 - 10.5 K/uL Final  . RBC 12/02/2015 3.90* 4.22 - 5.81 MIL/uL Final  . Hemoglobin 12/02/2015 12.3* 13.0 - 17.0 g/dL Final  . HCT 12/02/2015 37.4* 39.0 - 52.0 % Final  . MCV 12/02/2015 95.9  78.0 - 100.0 fL Final  . MCH 12/02/2015 31.5  26.0 - 34.0 pg Final  . MCHC 12/02/2015  32.9  30.0 - 36.0 g/dL Final  . RDW 12/02/2015 14.5  11.5 - 15.5 % Final  . Platelets 12/02/2015 149* 150 - 400 K/uL Final  . Troponin i, poc 12/02/2015 0.03  0.00 - 0.08 ng/mL Final  . Comment 3 12/02/2015          Final   Comment: Due to the release kinetics of cTnI, a negative result within the first hours of the onset of symptoms does not rule out myocardial infarction with certainty. If myocardial infarction is still suspected, repeat the test at appropriate intervals.   . Sodium 12/03/2015 140  135 - 145 mmol/L Final  . Potassium 12/03/2015 4.1  3.5 - 5.1 mmol/L Final   DELTA CHECK NOTED  . Chloride 12/03/2015 107  101 - 111 mmol/L Final  . CO2 12/03/2015 25  22 - 32 mmol/L Final  . Glucose, Bld 12/03/2015 135* 65 - 99 mg/dL Final  . BUN 12/03/2015 16  6 - 20 mg/dL Final  . Creatinine, Ser 12/03/2015 1.07  0.61 - 1.24 mg/dL Final  . Calcium 12/03/2015 8.4* 8.9 - 10.3 mg/dL Final  . GFR calc non Af Amer 12/03/2015 60* >60 mL/min Final  . GFR calc Af Amer 12/03/2015 >60  >60 mL/min Final   Comment: (NOTE) The eGFR has been calculated using the CKD EPI equation. This calculation has not been validated in all clinical situations. eGFR's persistently <60 mL/min signify possible Chronic Kidney Disease.   . Anion gap 12/03/2015 8  5 -  15 Final  . WBC 12/03/2015 4.5  4.0 - 10.5 K/uL Final  . RBC 12/03/2015 3.32* 4.22 - 5.81 MIL/uL Final  . Hemoglobin 12/03/2015 10.8* 13.0 - 17.0 g/dL Final  . HCT 12/03/2015 32.4* 39.0 - 52.0 % Final  . MCV 12/03/2015 97.6  78.0 - 100.0 fL Final  . MCH 12/03/2015 32.5  26.0 - 34.0 pg Final  . MCHC 12/03/2015 33.3  30.0 - 36.0 g/dL Final  . RDW 12/03/2015 14.6  11.5 - 15.5 % Final  . Platelets 12/03/2015 129* 150 - 400 K/uL Final  . Glucose-Capillary 12/02/2015 194* 65 - 99 mg/dL Final  . Comment 1 12/02/2015 Notify RN   Final  . Comment 2 12/02/2015 Document in Chart   Final  . Glucose-Capillary 12/03/2015 107* 65 - 99 mg/dL Final  . TSH  12/03/2015 8.142* 0.350 - 4.500 uIU/mL Final  . Glucose-Capillary 12/03/2015 73  65 - 99 mg/dL Final  . WBC 12/04/2015 5.4  4.0 - 10.5 K/uL Final  . RBC 12/04/2015 3.70* 4.22 - 5.81 MIL/uL Final  . Hemoglobin 12/04/2015 11.7* 13.0 - 17.0 g/dL Final  . HCT 12/04/2015 36.0* 39.0 - 52.0 % Final  . MCV 12/04/2015 97.3  78.0 - 100.0 fL Final  . MCH 12/04/2015 31.6  26.0 - 34.0 pg Final  . MCHC 12/04/2015 32.5  30.0 - 36.0 g/dL Final  . RDW 12/04/2015 14.4  11.5 - 15.5 % Final  . Platelets 12/04/2015 133* 150 - 400 K/uL Final  . Sodium 12/04/2015 137  135 - 145 mmol/L Final  . Potassium 12/04/2015 4.2  3.5 - 5.1 mmol/L Final  . Chloride 12/04/2015 106  101 - 111 mmol/L Final  . CO2 12/04/2015 23  22 - 32 mmol/L Final  . Glucose, Bld 12/04/2015 107* 65 - 99 mg/dL Final  . BUN 12/04/2015 15  6 - 20 mg/dL Final  . Creatinine, Ser 12/04/2015 1.17  0.61 - 1.24 mg/dL Final  . Calcium 12/04/2015 8.3* 8.9 - 10.3 mg/dL Final  . GFR calc non Af Amer 12/04/2015 54* >60 mL/min Final  . GFR calc Af Amer 12/04/2015 >60  >60 mL/min Final   Comment: (NOTE) The eGFR has been calculated using the CKD EPI equation. This calculation has not been validated in all clinical situations. eGFR's persistently <60 mL/min signify possible Chronic Kidney Disease.   . Anion gap 12/04/2015 8  5 - 15 Final  . Glucose-Capillary 12/03/2015 118* 65 - 99 mg/dL Final  . Glucose-Capillary 12/03/2015 83  65 - 99 mg/dL Final  . Glucose-Capillary 12/04/2015 97  65 - 99 mg/dL Final  . Comment 1 12/04/2015 Notify RN   Final  . Glucose-Capillary 12/04/2015 107* 65 - 99 mg/dL Final  . Glucose-Capillary 12/04/2015 109* 65 - 99 mg/dL Final  . Glucose-Capillary 12/04/2015 111* 65 - 99 mg/dL Final  . Glucose-Capillary 12/04/2015 148* 65 - 99 mg/dL Final  . Glucose-Capillary 12/05/2015 128* 65 - 99 mg/dL Final  . Glucose-Capillary 12/05/2015 83  65 - 99 mg/dL Final  . Comment 1 12/05/2015 Notify RN   Final  . Glucose-Capillary  12/05/2015 158* 65 - 99 mg/dL Final  . Glucose-Capillary 12/05/2015 72  65 - 99 mg/dL Final  . Glucose-Capillary 12/06/2015 129* 65 - 99 mg/dL Final  . Glucose-Capillary 12/06/2015 115* 65 - 99 mg/dL Final  . Comment 1 12/06/2015 Notify RN   Final  . Glucose-Capillary 12/06/2015 122* 65 - 99 mg/dL Final  . Comment 1 12/06/2015 Notify RN   Final  . Tacey Ruiz  2 12/06/2015 Document in Chart   Final  Office Visit on 11/27/2015  Component Date Value Ref Range Status  . Sodium 11/27/2015 134* 135 - 146 mmol/L Final  . Potassium 11/27/2015 4.6  3.5 - 5.3 mmol/L Final  . Chloride 11/27/2015 100  98 - 110 mmol/L Final  . CO2 11/27/2015 25  20 - 31 mmol/L Final  . Glucose, Bld 11/27/2015 54* 65 - 99 mg/dL Final  . BUN 11/27/2015 21  7 - 25 mg/dL Final  . Creat 11/27/2015 1.24* 0.70 - 1.11 mg/dL Final  . Calcium 11/27/2015 8.4* 8.6 - 10.3 mg/dL Final  . WBC 11/27/2015 7.1  4.0 - 10.5 K/uL Final  . RBC 11/27/2015 3.80* 4.22 - 5.81 MIL/uL Final  . Hemoglobin 11/27/2015 13.0  13.0 - 17.0 g/dL Final  . HCT 11/27/2015 36.6* 39.0 - 52.0 % Final  . MCV 11/27/2015 96.3  78.0 - 100.0 fL Final  . MCH 11/27/2015 34.2* 26.0 - 34.0 pg Final  . MCHC 11/27/2015 35.5  30.0 - 36.0 g/dL Final  . RDW 11/27/2015 14.3  11.5 - 15.5 % Final  . Platelets 11/27/2015 186  150 - 400 K/uL Final  . MPV 11/27/2015 10.2  8.6 - 12.4 fL Final  . TSH 11/27/2015 9.203* 0.350 - 4.500 uIU/mL Final  . Total Bilirubin 11/27/2015 0.5  0.2 - 1.2 mg/dL Final  . Bilirubin, Direct 11/27/2015 0.1  <=0.2 mg/dL Final  . Indirect Bilirubin 11/27/2015 0.4  0.2 - 1.2 mg/dL Final  . Alkaline Phosphatase 11/27/2015 130* 40 - 115 U/L Final  . AST 11/27/2015 32  10 - 35 U/L Final  . ALT 11/27/2015 22  9 - 46 U/L Final  . Total Protein 11/27/2015 6.0* 6.1 - 8.1 g/dL Final  . Albumin 11/27/2015 3.0* 3.6 - 5.1 g/dL Final  . Digoxin Level 11/27/2015 1.6  0.8 - 2.0 ug/L Final   Comment: ** Please note change in unit of measure. ** FAB Anti-Digoxin  (Digibind(R)) in serum/plasma of patients under toxicity therapy may interfere with the digoxin immunoassay.   Admission on 11/04/2015, Discharged on 11/12/2015  No results displayed because visit has over 200 results.    Lab on 11/04/2015  Component Date Value Ref Range Status  . TNIDX 11/04/2015 0.03  0.00 - 0.06 ug/l Final  . Sodium 11/04/2015 137  135 - 145 mEq/L Final  . Potassium 11/04/2015 5.1  3.5 - 5.1 mEq/L Final  . Chloride 11/04/2015 103  96 - 112 mEq/L Final  . CO2 11/04/2015 27  19 - 32 mEq/L Final  . Glucose, Bld 11/04/2015 140* 70 - 99 mg/dL Final  . BUN 11/04/2015 20  6 - 23 mg/dL Final  . Creatinine, Ser 11/04/2015 1.16  0.40 - 1.50 mg/dL Final  . Calcium 11/04/2015 9.0  8.4 - 10.5 mg/dL Final  . GFR 11/04/2015 63.09  >60.00 mL/min Final  . WBC 11/04/2015 7.4  4.0 - 10.5 K/uL Final  . RBC 11/04/2015 4.07* 4.22 - 5.81 Mil/uL Final  . Hemoglobin 11/04/2015 12.8* 13.0 - 17.0 g/dL Final  . HCT 11/04/2015 39.5  39.0 - 52.0 % Final  . MCV 11/04/2015 96.9  78.0 - 100.0 fl Final  . MCHC 11/04/2015 32.5  30.0 - 36.0 g/dL Final  . RDW 11/04/2015 14.8  11.5 - 15.5 % Final  . Platelets 11/04/2015 167.0  150.0 - 400.0 K/uL Final  . Neutrophils Relative % 11/04/2015 65.6  43.0 - 77.0 % Final  . Lymphocytes Relative 11/04/2015 13.4  12.0 - 46.0 %  Final  . Monocytes Relative 11/04/2015 14.0* 3.0 - 12.0 % Final  . Eosinophils Relative 11/04/2015 6.4* 0.0 - 5.0 % Final  . Basophils Relative 11/04/2015 0.6  0.0 - 3.0 % Final  . Neutro Abs 11/04/2015 4.8  1.4 - 7.7 K/uL Final  . Lymphs Abs 11/04/2015 1.0  0.7 - 4.0 K/uL Final  . Monocytes Absolute 11/04/2015 1.0  0.1 - 1.0 K/uL Final  . Eosinophils Absolute 11/04/2015 0.5  0.0 - 0.7 K/uL Final  . Basophils Absolute 11/04/2015 0.0  0.0 - 0.1 K/uL Final  . D-Dimer, Quant 11/04/2015 0.63* 0.00 - 0.48 ug/mL-FEU Final   Comment: At the inhouse established cutoff value of 0.48 ug/mL FEU, this methology has been documented in the  literature to have a sensitivity and negative predictive value of at least 98-99%.  The test result should be correlated with an assessment of the clinical probability of DVT/VTE.   Marland Kitchen Pro B Natriuretic peptide (BNP) 11/04/2015 504.0* 0.0 - 100.0 pg/mL Final  . Total CK 11/04/2015 42  7 - 232 U/L Final  . CK, MB 11/04/2015 2.5  0.0 - 5.0 ng/mL Final  . Relative Index 11/04/2015 6.0* 0.0 - 4.0 Final   Relative Index not valid when CK is < 100  Appointment on 10/21/2015  Component Date Value Ref Range Status  . Cholesterol 10/21/2015 114  0 - 200 mg/dL Final   ATP III Classification       Desirable:  < 200 mg/dL               Borderline High:  200 - 239 mg/dL          High:  > = 240 mg/dL  . Triglycerides 10/21/2015 59.0  0.0 - 149.0 mg/dL Final   Normal:  <150 mg/dLBorderline High:  150 - 199 mg/dL  . HDL 10/21/2015 46.20  >39.00 mg/dL Final  . VLDL 10/21/2015 11.8  0.0 - 40.0 mg/dL Final  . LDL Cholesterol 10/21/2015 56  0 - 99 mg/dL Final  . Total CHOL/HDL Ratio 10/21/2015 2   Final                  Men          Women1/2 Average Risk     3.4          3.3Average Risk          5.0          4.42X Average Risk          9.6          7.13X Average Risk          15.0          11.0                      . NonHDL 10/21/2015 68.12   Final   NOTE:  Non-HDL goal should be 30 mg/dL higher than patient's LDL goal (i.e. LDL goal of < 70 mg/dL, would have non-HDL goal of < 100 mg/dL)  . TSH 10/21/2015 7.48* 0.35 - 4.50 uIU/mL Final  . Hgb A1c MFr Bld 10/21/2015 6.5  4.6 - 6.5 % Final   Glycemic Control Guidelines for People with Diabetes:Non Diabetic:  <6%Goal of Therapy: <7%Additional Action Suggested:  >8%   . Sodium 10/21/2015 137  135 - 145 mEq/L Final  . Potassium 10/21/2015 4.5  3.5 - 5.1 mEq/L Final  . Chloride 10/21/2015 105  96 - 112 mEq/L Final  .  CO2 10/21/2015 25  19 - 32 mEq/L Final  . Glucose, Bld 10/21/2015 124* 70 - 99 mg/dL Final  . BUN 10/21/2015 19  6 - 23 mg/dL Final  . Creatinine,  Ser 10/21/2015 1.06  0.40 - 1.50 mg/dL Final  . Calcium 10/21/2015 8.9  8.4 - 10.5 mg/dL Final  . GFR 10/21/2015 70.01  >60.00 mL/min Final     Assessment/Plan

## 2015-12-17 NOTE — Progress Notes (Signed)
Patient ID: [name], male   DOB: [birth year], 82 y.o.   MRN: [id number]     Facility: [facility]    PCP: [name], MD  Code Status: full code  Allergies  Allergen Reactions  . Lisinopril Cough  . Toujeo Solostar [Insulin Glargine] Other (See Comments)    Shaking, felt bad all over  . Clindamycin Diarrhea  . Other Other (See Comments)    toujeo with perioral tingling only - pt refuses to take further  . Oxycodone-Acetaminophen Diarrhea  . Penicillins Hives    Has patient had a PCN reaction causing immediate rash, facial/tongue/throat swelling, SOB or lightheadedness with hypotension: Yes Has patient had a PCN reaction causing severe rash involving mucus membranes or skin necrosis: no  Has patient had a PCN reaction that required hospitalization No Has patient had a PCN reaction occurring within the last 10 years: No If all of the above answers are "NO", then may proceed with Cephalosporin use.    Chief Complaint  Patient presents with  . New Admit To SNF    New admission to facility     HPI:  82 y.o. patient is here for short term rehabilitation post hospital admission from 12/10/15-12/14/15 with HCAP and right sided pleural effusion. He was started on antibiotics and had pleurx catheter drained. He has PMH of CHF, CAD, afib, type 2 DM, hypothyroidism. He is seen in his room today. His breathing is stable, he has some cough with clear phlegm. Has low energy level. Denies any concerns.   Review of Systems:  Constitutional: Negative for fever, chills, diaphoresis.  HENT: Negative for headache, congestion, nasal discharge, difficulty swallowing.   Eyes: Negative for blurred vision, double vision and discharge.  Respiratory: Negative for shortness of breath and wheezing.   Cardiovascular: Negative for chest pain, palpitations, leg swelling.  Gastrointestinal: Negative for heartburn, nausea, vomiting, abdominal pain. Had bowel movement last  night.  Genitourinary: Negative for dysuria and flank pain.  Musculoskeletal: Negative for back pain, falls in the facility Skin: Negative for itching, rash.  Neurological: Negative for dizziness, tingling, focal weakness Psychiatric/Behavioral: Negative for depression    Past Medical History  Diagnosis Date  . Acid reflux disease     history of  . H/O: GI bleed   . Persistent atrial fibrillation ([place])     a. Failed tikosyn. b. DCCV did not hold 07/2015.  Marland Kitchen Benign prostatic hypertrophy     history of  . Hepatic damage march 2009    retained hepatic stone , intrahepatic duct catheter  . CAD (coronary artery disease)     a. R/LHC 11/09/15: mild nonobstructive CAD ([facility]) with well- compensated hemodynamics with low filling pressures.  . Hypertension   . 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 Seizures ([place])   . Ventricular tachycardia ? Tikosyn proarrhtyhmia   . Cataracts, bilateral   . Diabetes mellitus     insulin dep  . Pleural effusion 10/2015  . Acute on chronic systolic CHF (congestive heart failure) ([place])   . CKD (chronic kidney disease), stage II   . HCAP (healthcare-associated pneumonia) 12/10/2015  . Hypothyroid   . Pancreatic insufficiency ([place])   . Systolic CHF [facility])    Past Surgical History  Procedure Laterality Date  . Cataract extraction w/ intraocular lens  implant, bilateral  Bilateral ~ 2014  . Cholecystectomy    . Partial gastrectomy    . Orif shoulder fracture Left 1941    2 pens inserted  . Whipple procedure      operation  . Tumor excision Right     sarcoma removal from tricep  . Inguinal hernia repair Right X 2  . Appendectomy    . Cardioversion  03/07/2012    Procedure: CARDIOVERSION;  Surgeon: Deboraha Sprang, MD;  Location: [facility];  Service: Cardiovascular;  Laterality: N/A;  . Cardioversion N/A 08/10/2015     Procedure: CARDIOVERSION;  Surgeon: Thayer Headings, MD;  Location: [facility] ENDOSCOPY;  Service: Cardiovascular;  Laterality: N/A;  . Cardiac catheterization N/A 11/09/2015    Procedure: Right/Left Heart Cath and Coronary Angiography;  Surgeon: Jolaine Artist, MD;  Location: [facility] CV LAB;  Service: Cardiovascular;  Laterality: N/A;  . Chest tube insertion Right 12/04/2015    Procedure: INSERTION RIGHT PLEURAL DRAINAGE CATHETER;  Surgeon: Gaye Pollack, MD;  Location: [facility] OR;  Service: Thoracic;  Laterality: Right;  . Fracture surgery    . Thoracentesis     Social History:   reports that he quit smoking about 24 years ago. His smoking use included Pipe. He has never used smokeless tobacco. He reports that he does not drink alcohol or use illicit drugs.  Family History  Problem Relation Age of Onset  . Coronary artery disease Father 31  . Heart disease Father     fatal MI  . Osteoarthritis Mother 1  . Arthritis Mother   . Coronary artery disease Brother     cabg, avr, pvd with sents  . Heart disease Brother     CABG, PVD  . Peripheral vascular disease Sister 49  . Fibromyalgia Sister   . Arthritis Sister   . Coronary artery disease Sister   . Diabetes Sister   . Cancer Brother     Medications:   Medication List       This list is accurate as of: 12/17/15  4:08 PM.  Always use your most recent med list.               amiodarone 200 MG tablet  Commonly known as:  PACERONE  Take 1 tablet (200 mg total) by mouth 2 (two) times daily.     apixaban 2.5 MG Tabs tablet  Commonly known as:  ELIQUIS  Take 1 tablet (2.5 mg total) by mouth 2 (two) times daily.     azelastine 0.1 % nasal spray  Commonly known as:  ASTELIN  Place 2 sprays into both nostrils 2 (two) times daily. Use in each nostril as directed     CENTRUM SILVER PO  Take 1 tablet by mouth daily.     digoxin 0.125 MG tablet  Commonly known as:  LANOXIN  Take 1 tablet (0.125 mg total) by mouth every other  day.     finasteride 5 MG tablet  Commonly known as:  PROSCAR  Take 1 tablet (5 mg total) by mouth daily.     furosemide 20 MG tablet  Commonly known as:  LASIX  Take 1 tablet (20 mg total) by mouth every other day.     GLUCOSAMINE CHONDROITIN MSM PO  Take 1 tablet by mouth daily. Glucosamine-chondroitin 500-400 MG     levofloxacin 750 MG tablet  Commonly known as:  LEVAQUIN  Take 750 mg by mouth 2 (two) times daily.     levothyroxine 25 MCG tablet  Commonly  known as:  SYNTHROID, LEVOTHROID  Take 1 tablet (25 mcg total) by mouth daily before breakfast.     lipase/protease/amylase 12000 units Cpep capsule  Commonly known as:  CREON  Take 1 capsule (12,000 Units total) by mouth 3 (three) times daily before meals.     metoprolol tartrate 25 MG tablet  Commonly known as:  LOPRESSOR  Take 1 tablet (25 mg total) by mouth 2 (two) times daily.     NOVOLOG FLEXPEN 100 UNIT/ML FlexPen  Generic drug:  insulin aspart  Inject 5 Units into the skin daily. With supper     NOVOLOG FLEXPEN 100 UNIT/ML FlexPen  Generic drug:  insulin aspart  Inject 6 Units into the skin daily. With breakfast     NOVOLOG FLEXPEN 100 UNIT/ML FlexPen  Generic drug:  insulin aspart  Inject 3 Units into the skin daily. With lunch     PROBIOTIC DAILY PO  Take 1 tablet by mouth 2 (two) times daily. With meals     tamsulosin 0.4 MG Caps capsule  Commonly known as:  FLOMAX  Take 1 capsule (0.4 mg total) by mouth 2 (two) times daily.     vitamin C 500 MG tablet  Commonly known as:  ASCORBIC ACID  Take 500 mg by mouth daily.         Physical Exam: Filed Vitals:   12/17/15 0914  BP: 126/80  Pulse: 74  Temp: 97.2 F (36.2 C)  TempSrc: Oral  Resp: 20  Height: 6\' 3"  (1.905 m)  Weight: 163 lb 14 oz (74.333 kg)  SpO2: 98%    General- elderly male, thin built, in no acute distress Head- normocephalic, atraumatic Nose- no maxillary or frontal sinus tenderness, no nasal discharge Throat- moist  mucus membrane  Eyes- PERRLA, EOMI, no pallor, no icterus, no discharge, normal conjunctiva, normal sclera Neck- no cervical lymphadenopathy Cardiovascular- normal s1,s2, no murmurs, trace leg edema Respiratory- bilateral poor air entry, no wheeze, no rhonchi, no crackles, no use of accessory muscles Abdomen- bowel sounds present, soft, non tender Musculoskeletal- able to move all 4 extremities, generalized weakness  Neurological- no focal deficit, alert and oriented to person, place and time Skin- warm and dry Psychiatry- normal mood and affect    Labs reviewed: Basic Metabolic Panel:  Recent Labs  11/04/15 1312  11/12/15 0645  12/11/15 0646 12/12/15 0546 12/13/15 0611  NA 135  < > 135  < > 134* 133* 133*  K 4.5  < > 4.2  < > 4.1 4.2 3.8  CL 104  < > 100*  < > 102 100* 98*  CO2 26  < > 27  < > 28 26 26   GLUCOSE 115*  < > 155*  < > 148* 122* 127*  BUN 18  < > 26*  < > 16 16 17   CREATININE 1.16  < > 1.17  < > 1.10 1.01 1.08  CALCIUM 8.7*  < > 8.4*  < > 7.9* 7.9* 7.8*  MG 2.0  --  2.0  --  1.7  --   --   PHOS  --   --   --   --  2.5  --   --   < > = values in this interval not displayed. Liver Function Tests:  Recent Labs  12/11/15 0646 12/12/15 0546 12/13/15 0611  AST 25 28 35  ALT 20 19 21   ALKPHOS 109 110 106  BILITOT 1.0 0.6 0.7  PROT 4.9* 4.9* 5.0*  ALBUMIN 2.0* 1.9* 1.8*  Recent Labs  06/24/15 1511  LIPASE 10.0*   No results for input(s): AMMONIA in the last 8760 hours. CBC:  Recent Labs  11/04/15 1312  12/04/15 0330 12/10/15 1730 12/11/15 0646  WBC 7.1  < > 5.4 8.5 6.7  NEUTROABS 4.5  --   --  5.6 4.2  HGB 11.8*  < > 11.7* 12.9* 12.3*  HCT 35.6*  < > 36.0* 38.6* 37.1*  MCV 95.7  < > 97.3 95.3 96.1  PLT 151  < > 133* 159 145*  < > = values in this interval not displayed. Cardiac Enzymes:  Recent Labs  11/04/15 1300  CKTOTAL 42  CKMB 2.5   BNP: Invalid input(s): POCBNP CBG:  Recent Labs  12/14/15 2121 12/15/15 0804  12/15/15 1155  GLUCAP 157* 138* 189*    Radiological Exams: Dg Chest 2 View  12/10/2015  CLINICAL DATA:  Shortness of breath.  Fever.  Weakness. EXAM: CHEST  2 VIEW COMPARISON:  12/04/2015 chest radiograph. FINDINGS: Catheter fragment appears stable in position in the mediastinum and anterior mid to lower right lung. Right basilar chest tube is stable. Stable cardiomediastinal silhouette with mild cardiomegaly. No pneumothorax. There is interval reaccumulation of a small posterior basilar right pleural effusion. No left pleural effusion. There is new patchy opacity in the mid to lower right lung. There is stable mild patchy opacity in the medial left lung base. No overt pulmonary edema. IMPRESSION: 1. Interval reaccumulation of a small posterior basilar right pleural effusion despite stable position of the basilar right chest tube. No pneumothorax. 2. New patchy opacity in the mid to lower right lung, suspicious for new pneumonia. 3. Stable mild patchy opacity in the medial left lung base, favor atelectasis. 4. Stable mild cardiomegaly without overt pulmonary edema. Electronically Signed   By: Ilona Sorrel M.D.   On: 12/10/2015 17:25     Assessment/Plan  Physical deconditioning Will have him work with physical therapy and occupational therapy team to help with gait training and muscle strengthening exercises.fall precautions. Skin care. Encourage to be out of bed.   HCAP Continue and complete course of levofloxacin 750 mg daily today. Add incentive spirometer. Monitor wbc and temp curve. Repeat cxr to in 3 weeks to assess for resolution of pneumonia.   Chronic pleural effusion Has pleurx catheter in place, to be drained q3 days, monitor clinically. Denies dyspnea at present  Protein calorie malnutrition Monitor po intake, encourage po intake. Monitor weight, dietary consult  Hyponatremia On lasix, monitor bmp  Anemia of chronic disease Monitor cbc  afib Rate controlled. Continue  lopressor 25 mg bid, pacerone 200 mg bid, digoxin 0.125 mg qod and eliquis 2.5 mg bid for anticoagulation  BPH Continue proscar 5 mg daily and flomax  Chronic systolic chf Continue lopressor 25 mg bid and lasix 20 mg qod. Not on any kcl supplement. Check bmp. Check daily weight  Dm Monitor cbg, continue novolog 5 u with dinner, 6 u with breakfast and 3 u with lunch Lab Results  Component Value Date   HGBA1C 6.5 10/21/2015   HTN Stable bp, continue lopressor current regimen, check bp daily  Hypothyroidism Continue synthroid current regimen, check tsh and free t4 in 1 week Lab Results  Component Value Date   TSH 8.142* 12/03/2015    Goals of care: short term rehabilitation   Labs/tests ordered: cbc, cmp 12/18/15, cxr in 3 weeks, tsh in 1 week  Family/ staff Communication: reviewed care plan with patient and nursing supervisor    [facility]  [place], [facility] [phone] (Monday-Friday 8 am - 5 pm) [phone] (afterhours)

## 2015-12-22 ENCOUNTER — Other Ambulatory Visit: Payer: Self-pay | Admitting: Surgery

## 2015-12-22 DIAGNOSIS — J90 Pleural effusion, not elsewhere classified: Secondary | ICD-10-CM

## 2015-12-23 ENCOUNTER — Ambulatory Visit
Admission: RE | Admit: 2015-12-23 | Discharge: 2015-12-23 | Disposition: A | Discharge status: Home / Self Care | Payer: Medicare Other | Source: Ambulatory Visit | Attending: Surgery | Admitting: Surgery

## 2015-12-23 ENCOUNTER — Ambulatory Visit (INDEPENDENT_AMBULATORY_CARE_PROVIDER_SITE_OTHER): Payer: Medicare Other | Admitting: Surgery

## 2015-12-23 ENCOUNTER — Encounter: Payer: Medicare Other | Admitting: Surgery

## 2015-12-23 ENCOUNTER — Encounter: Payer: Self-pay | Admitting: Surgery

## 2015-12-23 VITALS — BP 92/58 | HR 51 | Resp 16 | Ht 75.0 in | Wt 163.0 lb

## 2015-12-23 DIAGNOSIS — Z09 Encounter for follow-up examination after completed treatment for conditions other than malignant neoplasm: Secondary | ICD-10-CM | POA: Diagnosis not present

## 2015-12-23 DIAGNOSIS — J948 Other specified pleural conditions: Secondary | ICD-10-CM | POA: Diagnosis not present

## 2015-12-23 DIAGNOSIS — J90 Pleural effusion, not elsewhere classified: Secondary | ICD-10-CM

## 2015-12-23 DIAGNOSIS — I2510 Atherosclerotic heart disease of native coronary artery without angina pectoris: Secondary | ICD-10-CM

## 2015-12-23 NOTE — Progress Notes (Signed)
HPI: Patient returns for routine postoperative follow-up having undergone insertion of a right PleurX catheter on  12/04/2015. The etiology of his effusion is thought to be heart failure. He has been at [facility] for rehab but has not been able to walk yet. His legs are very weak after his bout of pneumonia. His catheter has been drained every 3 days and the first time it drained 600 cc, then 350 cc but only 40 cc and 50 cc the past two times.  Current Outpatient Prescriptions  Medication Sig Dispense Refill  . amiodarone (PACERONE) 200 MG tablet Take 1 tablet (200 mg total) by mouth 2 (two) times daily. 180 tablet 2  . apixaban (ELIQUIS) 2.5 MG TABS tablet Take 1 tablet (2.5 mg total) by mouth 2 (two) times daily. 60 tablet 2  . Ascorbic Acid (VITAMIN C) 500 MG tablet Take 500 mg by mouth daily.     Marland Kitchen azelastine (ASTELIN) 0.1 % nasal spray Place 2 sprays into both nostrils 2 (two) times daily. Use in each nostril as directed 90 mL 3  . digoxin (LANOXIN) 0.125 MG tablet Take 1 tablet (0.125 mg total) by mouth every other day. 90 tablet 2  . finasteride (PROSCAR) 5 MG tablet Take 1 tablet (5 mg total) by mouth daily. 90 tablet 3  . furosemide (LASIX) 20 MG tablet Take 1 tablet (20 mg total) by mouth every other day. 45 tablet 3  . insulin aspart (NOVOLOG FLEXPEN) 100 UNIT/ML FlexPen Inject 5 Units into the skin daily. With supper    . insulin aspart (NOVOLOG FLEXPEN) 100 UNIT/ML FlexPen Inject 6 Units into the skin daily. With breakfast    . insulin aspart (NOVOLOG FLEXPEN) 100 UNIT/ML FlexPen Inject 3 Units into the skin daily. With lunch    . levofloxacin (LEVAQUIN) 750 MG tablet Take 750 mg by mouth 2 (two) times daily.    Marland Kitchen levothyroxine (SYNTHROID, LEVOTHROID) 25 MCG tablet Take 1 tablet (25 mcg total) by mouth daily before breakfast. 30 tablet 5  . lipase/protease/amylase (CREON) 12000 UNITS CPEP capsule Take 1 capsule (12,000 Units total) by mouth 3 (three) times daily before  meals. 270 capsule 3  . metoprolol tartrate (LOPRESSOR) 25 MG tablet Take 1 tablet (25 mg total) by mouth 2 (two) times daily. 180 tablet 3  . Misc Natural Products (GLUCOSAMINE CHONDROITIN MSM PO) Take 1 tablet by mouth daily. Glucosamine-chondroitin 500-400 MG    . Multiple Vitamins-Minerals (CENTRUM SILVER PO) Take 1 tablet by mouth daily.     . Probiotic Product (PROBIOTIC DAILY PO) Take 1 tablet by mouth 2 (two) times daily. With meals    . tamsulosin (FLOMAX) 0.4 MG CAPS capsule Take 1 capsule (0.4 mg total) by mouth 2 (two) times daily. 180 capsule 3   No current facility-administered medications for this visit.    Physical Exam: BP 92/58 mmHg  Pulse 51  Resp 16  Ht 6\' 3"  (1.905 m)  Wt 163 lb (73.936 kg)  BMI 20.37 kg/m2  SpO2 95% He looks weak Lung exam reveals clear breath sounds that may be a diminished a little on the right compared to the left.  Diagnostic Tests:   CLINICAL DATA: Pleural effusion. Pleural drainage catheters.  EXAM: CHEST 2 VIEW  COMPARISON: 12/10/2015.  FINDINGS: Right pleural drainage tubes in stable position. Persistent right pleural fluid collection. Mild atelectasis and/or infiltrate right lung base again noted. Left lung clear. Stable cardiomegaly. No pneumothorax. No acute bony abnormality .  IMPRESSION: Right  pleural drainage catheters in stable position. Persistent right pleural fluid collection. Mild atelectasis and/or infiltrate right lung base again no.   Electronically Signed  By: [place]  On: 12/23/2015 12:16   Impression:  There is minimal drainage from the catheter. There is some haziness in the right chest but I can't tell if it is some loculated effusion or atelectasis. I encouraged him to continue using his IS since he is not ambulatory. The catheter should be drained every 3 days and I will see him in one week with a CXR to decide about catheter removal.  Plan:  Return in one week with a  CXR.   Gaye Pollack, MD [facility] [phone]

## 2015-12-25 ENCOUNTER — Emergency Department (HOSPITAL_COMMUNITY)
Admission: EM | Admit: 2015-12-25 | Discharge: 2015-12-26 | Disposition: A | Discharge status: Home / Self Care | Payer: Medicare Other | Attending: Emergency Medicine | Admitting: Emergency Medicine

## 2015-12-25 ENCOUNTER — Encounter (HOSPITAL_COMMUNITY): Payer: Self-pay | Admitting: Emergency Medicine

## 2015-12-25 ENCOUNTER — Emergency Department (HOSPITAL_COMMUNITY): Payer: Medicare Other

## 2015-12-25 DIAGNOSIS — N400 Enlarged prostate without lower urinary tract symptoms: Secondary | ICD-10-CM | POA: Insufficient documentation

## 2015-12-25 DIAGNOSIS — Z9889 Other specified postprocedural states: Secondary | ICD-10-CM | POA: Insufficient documentation

## 2015-12-25 DIAGNOSIS — E119 Type 2 diabetes mellitus without complications: Secondary | ICD-10-CM | POA: Insufficient documentation

## 2015-12-25 DIAGNOSIS — Z880 Allergy status to penicillin: Secondary | ICD-10-CM | POA: Insufficient documentation

## 2015-12-25 DIAGNOSIS — Z86018 Personal history of other benign neoplasm: Secondary | ICD-10-CM | POA: Diagnosis not present

## 2015-12-25 DIAGNOSIS — Z7901 Long term (current) use of anticoagulants: Secondary | ICD-10-CM | POA: Insufficient documentation

## 2015-12-25 DIAGNOSIS — Z8701 Personal history of pneumonia (recurrent): Secondary | ICD-10-CM | POA: Insufficient documentation

## 2015-12-25 DIAGNOSIS — I129 Hypertensive chronic kidney disease with stage 1 through stage 4 chronic kidney disease, or unspecified chronic kidney disease: Secondary | ICD-10-CM | POA: Diagnosis not present

## 2015-12-25 DIAGNOSIS — N182 Chronic kidney disease, stage 2 (mild): Secondary | ICD-10-CM | POA: Diagnosis not present

## 2015-12-25 DIAGNOSIS — R079 Chest pain, unspecified: Secondary | ICD-10-CM | POA: Diagnosis present

## 2015-12-25 DIAGNOSIS — Z8509 Personal history of malignant neoplasm of other digestive organs: Secondary | ICD-10-CM | POA: Insufficient documentation

## 2015-12-25 DIAGNOSIS — K219 Gastro-esophageal reflux disease without esophagitis: Secondary | ICD-10-CM | POA: Insufficient documentation

## 2015-12-25 DIAGNOSIS — I482 Chronic atrial fibrillation, unspecified: Secondary | ICD-10-CM

## 2015-12-25 DIAGNOSIS — I5023 Acute on chronic systolic (congestive) heart failure: Secondary | ICD-10-CM | POA: Diagnosis not present

## 2015-12-25 DIAGNOSIS — Z8709 Personal history of other diseases of the respiratory system: Secondary | ICD-10-CM | POA: Diagnosis not present

## 2015-12-25 DIAGNOSIS — Z79899 Other long term (current) drug therapy: Secondary | ICD-10-CM | POA: Insufficient documentation

## 2015-12-25 DIAGNOSIS — Z794 Long term (current) use of insulin: Secondary | ICD-10-CM | POA: Diagnosis not present

## 2015-12-25 DIAGNOSIS — E039 Hypothyroidism, unspecified: Secondary | ICD-10-CM | POA: Insufficient documentation

## 2015-12-25 DIAGNOSIS — Z8669 Personal history of other diseases of the nervous system and sense organs: Secondary | ICD-10-CM | POA: Diagnosis not present

## 2015-12-25 DIAGNOSIS — I2510 Atherosclerotic heart disease of native coronary artery without angina pectoris: Secondary | ICD-10-CM | POA: Insufficient documentation

## 2015-12-25 DIAGNOSIS — Z87891 Personal history of nicotine dependence: Secondary | ICD-10-CM | POA: Diagnosis not present

## 2015-12-25 LAB — BASIC METABOLIC PANEL
ANION GAP: 10 (ref 5–15)
BUN: 19 mg/dL (ref 6–20)
CHLORIDE: 95 mmol/L — AB (ref 101–111)
CO2: 31 mmol/L (ref 22–32)
Calcium: 8.1 mg/dL — ABNORMAL LOW (ref 8.9–10.3)
Creatinine, Ser: 1.19 mg/dL (ref 0.61–1.24)
GFR, EST NON AFRICAN AMERICAN: 53 mL/min — AB (ref >60)
Glucose, Bld: 159 mg/dL — ABNORMAL HIGH (ref 65–99)
POTASSIUM: 4.1 mmol/L (ref 3.5–5.1)
SODIUM: 136 mmol/L (ref 135–145)

## 2015-12-25 LAB — CBC
HEMATOCRIT: 38.8 % — AB (ref 39.0–52.0)
HEMOGLOBIN: 13.1 g/dL (ref 13.0–17.0)
MCH: 32.1 pg (ref 26.0–34.0)
MCHC: 33.8 g/dL (ref 30.0–36.0)
MCV: 95.1 fL (ref 78.0–100.0)
Platelets: 237 10*3/uL (ref 150–400)
RBC: 4.08 MIL/uL — AB (ref 4.22–5.81)
RDW: 13.8 % (ref 11.5–15.5)
WBC: 10.2 10*3/uL (ref 4.0–10.5)

## 2015-12-25 LAB — I-STAT TROPONIN, ED: TROPONIN I, POC: 0.04 ng/mL (ref 0.00–0.08)

## 2015-12-25 LAB — PROTIME-INR
INR: 1.44 (ref 0.00–1.49)
Prothrombin Time: 17.7 seconds — ABNORMAL HIGH (ref 11.6–15.2)

## 2015-12-25 MED ORDER — NITROGLYCERIN 0.4 MG SL SUBL: 0.4000 mg | SUBLINGUAL_TABLET | SUBLINGUAL | Status: DC | PRN

## 2015-12-25 NOTE — ED Notes (Addendum)
Pt. arrived with EMS from [facility] home reports central chest pain with mild SOB onset this evening , denies nausea or diaphoresis , received 4 baby ASA at nursing home prior to arrival , given 1 NTG sl by EMS with relief , rates pain 2/10 at arrival . Pt. has a right pleural drainage catheter ( drained q 3 days ).

## 2015-12-25 NOTE — ED Notes (Signed)
Patient transported to X-ray 

## 2015-12-25 NOTE — ED Provider Notes (Signed)
CSN: [id number]     Arrival date & time 12/25/15  2147 History   First MD Initiated Contact with Patient 12/25/15 2256     Chief Complaint  Patient presents with  . Chest Pain     (Consider location/radiation/quality/duration/timing/severity/associated sxs/prior Treatment) Patient is a 82 y.o. male presenting with chest pain. The history is provided by the patient.  Chest Pain He at onset about 6 PM of a tight feeling across his chest. This was slightly worse if he took a deep breath. No associated dyspnea, nausea, diaphoresis. He rated the discomfort at 2/10. He got a dose of nitroglycerin and tightness went away completely only to recur. It is currently at 2/10. He has not had symptoms like this before. Of note, he did have a heart catheterization 1 month ago and was told that everything was okay. He was recently hospitalized for pneumonia and has a right-sided drainage catheter for a pleural effusion. In addition to the nitroglycerin glycerin, he did receive a dose of aspirin.  Past Medical History  Diagnosis Date  . Acid reflux disease     history of  . H/O: GI bleed   . Persistent atrial fibrillation ([place])     a. Failed tikosyn. b. DCCV did not hold 07/2015.  Marland Kitchen Benign prostatic hypertrophy     history of  . Hepatic damage march 2009    retained hepatic stone , intrahepatic duct catheter  . CAD (coronary artery disease)     a. R/LHC 11/09/15: mild nonobstructive CAD ([facility]) with well- compensated hemodynamics with low filling pressures.  . Hypertension   . 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 Seizures ([place])   . Ventricular tachycardia ? Tikosyn proarrhtyhmia   . Cataracts, bilateral   . Diabetes mellitus     insulin dep  . Pleural effusion 10/2015  . Acute on chronic systolic CHF (congestive heart failure) ([place])   . CKD (chronic  kidney disease), stage II   . HCAP (healthcare-associated pneumonia) 12/10/2015  . Hypothyroid   . Pancreatic insufficiency ([place])   . Systolic CHF [facility])    Past Surgical History  Procedure Laterality Date  . Cataract extraction w/ intraocular lens  implant, bilateral Bilateral ~ 2014  . Cholecystectomy    . Partial gastrectomy    . Orif shoulder fracture Left 1941    2 pens inserted  . Whipple procedure      operation  . Tumor excision Right     sarcoma removal from tricep  . Inguinal hernia repair Right X 2  . Appendectomy    . Cardioversion  03/07/2012    Procedure: CARDIOVERSION;  Surgeon: Deboraha Sprang, MD;  Location: [place];  Service: Cardiovascular;  Laterality: N/A;  . Cardioversion N/A 08/10/2015    Procedure: CARDIOVERSION;  Surgeon: Thayer Headings, MD;  Location: [facility] ENDOSCOPY;  Service: Cardiovascular;  Laterality: N/A;  . Cardiac catheterization N/A 11/09/2015    Procedure: Right/Left Heart Cath and Coronary Angiography;  Surgeon: Jolaine Artist, MD;  Location: [place] CV LAB;  Service: Cardiovascular;  Laterality: N/A;  . Chest tube insertion Right 12/04/2015    Procedure: INSERTION RIGHT PLEURAL DRAINAGE CATHETER;  Surgeon: Gaye Pollack, MD;  Location: [place] OR;  Service: Thoracic;  Laterality: Right;  . Fracture surgery    . Thoracentesis  Family History  Problem Relation Age of Onset  . Coronary artery disease Father 4  . Heart disease Father     fatal MI  . Osteoarthritis Mother 90  . Arthritis Mother   . Coronary artery disease Brother     cabg, avr, pvd with sents  . Heart disease Brother     CABG, PVD  . Peripheral vascular disease Sister 72  . Fibromyalgia Sister   . Arthritis Sister   . Coronary artery disease Sister   . Diabetes Sister   . Cancer Brother    Social History  Substance Use Topics  . Smoking status: Former Smoker    Types: Pipe    Quit date: 03/14/1991  . Smokeless tobacco: Never Used  . Alcohol Use: No    Review of  Systems  Cardiovascular: Positive for chest pain.  All other systems reviewed and are negative.     Allergies  Lisinopril; Toujeo solostar; Clindamycin; Other; Oxycodone-acetaminophen; and Penicillins  Home Medications   Prior to Admission medications   Medication Sig Start Date End Date Taking? Authorizing Provider  Amino Acids-Protein Hydrolys (FEEDING SUPPLEMENT, PRO-STAT SUGAR FREE 64,) LIQD Take 30 mLs by mouth 2 (two) times daily.   Yes Historical Provider, MD  amiodarone (PACERONE) 200 MG tablet Take 1 tablet (200 mg total) by mouth 2 (two) times daily. 11/27/15  Yes Renee Dyane Dustman, PA-C  apixaban (ELIQUIS) 2.5 MG TABS tablet Take 1 tablet (2.5 mg total) by mouth 2 (two) times daily. 11/10/15  Yes Reyne Dumas, MD  Ascorbic Acid (VITAMIN C) 500 MG tablet Take 500 mg by mouth daily.    Yes Historical Provider, MD  azelastine (ASTELIN) 0.1 % nasal spray Place 2 sprays into both nostrils 2 (two) times daily. Use in each nostril as directed 10/21/15  Yes Janith Lima, MD  digoxin (LANOXIN) 0.125 MG tablet Take 1 tablet (0.125 mg total) by mouth every other day. 11/27/15  Yes Renee Dyane Dustman, PA-C  finasteride (PROSCAR) 5 MG tablet Take 1 tablet (5 mg total) by mouth daily. 06/10/15  Yes Janith Lima, MD  furosemide (LASIX) 20 MG tablet Take 1 tablet (20 mg total) by mouth every other day. 11/11/15  Yes Reyne Dumas, MD  glucosamine-chondroitin 500-400 MG tablet Take 1 tablet by mouth daily.   Yes Historical Provider, MD  insulin aspart (NOVOLOG FLEXPEN) 100 UNIT/ML FlexPen Inject 3-6 Units into the skin See admin instructions. Use 6 units at breakfast, use 3 units at lunch and use 5 units at supper   Yes Historical Provider, MD  levothyroxine (SYNTHROID, LEVOTHROID) 25 MCG tablet Take 1 tablet (25 mcg total) by mouth daily before breakfast. 11/27/15  Yes Renee Dyane Dustman, PA-C  lipase/protease/amylase (CREON) 12000 UNITS CPEP capsule Take 1 capsule (12,000 Units total) by mouth 3  (three) times daily before meals. 10/27/14  Yes Janith Lima, MD  metoprolol tartrate (LOPRESSOR) 25 MG tablet Take 1 tablet (25 mg total) by mouth 2 (two) times daily. 12/01/15  Yes Renee Dyane Dustman, PA-C  Multiple Vitamin (MULTIVITAMIN WITH MINERALS) TABS tablet Take 1 tablet by mouth daily.   Yes Historical Provider, MD  Probiotic Product (PROBIOTIC DAILY PO) Take 1 tablet by mouth 2 (two) times daily. With meals   Yes Historical Provider, MD  tamsulosin (FLOMAX) 0.4 MG CAPS capsule Take 1 capsule (0.4 mg total) by mouth 2 (two) times daily. 06/10/15  Yes Janith Lima, MD   BP 113/80 mmHg  Pulse 99  Temp(Src) 98.2 F (  36.8 C) (Oral)  Resp 17  Ht 6\' 3"  (1.905 m)  Wt 159 lb (72.122 kg)  BMI 19.87 kg/m2  SpO2 96% Physical Exam  Nursing note and vitals reviewed.  82 year old male, resting comfortably and in no acute distress. Vital signs are normal. Oxygen saturation is 96%, which is normal. Head is normocephalic and atraumatic. PERRLA, EOMI. Oropharynx is clear. Neck is nontender and supple without adenopathy or JVD. Back is nontender and there is no CVA tenderness. Lungs have bibasilar rales without wheezes or rhonchi. Chest is nontender. Heart has an irregular rhythm without murmur. Abdomen is soft, flat, nontender without masses or hepatosplenomegaly and peristalsis is normoactive. Extremities have no cyanosis or edema, full range of motion is present. Skin is warm and dry without rash. Neurologic: Mental status is normal, cranial nerves are intact, there are no motor or sensory deficits.  ED Course  Procedures (including critical care time) Labs Review Results for orders placed or performed during the hospital encounter of [id number]  Basic metabolic panel  Result Value Ref Range   Sodium 136 135 - 145 mmol/L   Potassium 4.1 3.5 - 5.1 mmol/L   Chloride 95 (L) 101 - 111 mmol/L   CO2 31 22 - 32 mmol/L   Glucose, Bld 159 (H) 65 - 99 mg/dL   BUN 19 6 - 20 mg/dL   Creatinine,  Ser 1.19 0.61 - 1.24 mg/dL   Calcium 8.1 (L) 8.9 - 10.3 mg/dL   GFR calc non Af Amer 53 (L) >60 mL/min   GFR calc Af Amer >60 >60 mL/min   Anion gap 10 5 - 15  CBC  Result Value Ref Range   WBC 10.2 4.0 - 10.5 K/uL   RBC 4.08 (L) 4.22 - 5.81 MIL/uL   Hemoglobin 13.1 13.0 - 17.0 g/dL   HCT 38.8 (L) 39.0 - 52.0 %   MCV 95.1 78.0 - 100.0 fL   MCH 32.1 26.0 - 34.0 pg   MCHC 33.8 30.0 - 36.0 g/dL   RDW 13.8 11.5 - 15.5 %   Platelets 237 150 - 400 K/uL  Protime-INR - (order if Patient is taking Coumadin / Warfarin)  Result Value Ref Range   Prothrombin Time 17.7 (H) 11.6 - 15.2 seconds   INR 1.44 0.00 - 1.49  Brain natriuretic peptide  Result Value Ref Range   B Natriuretic Peptide 273.9 (H) 0.0 - 100.0 pg/mL  I-stat troponin, ED (not at [facility])  Result Value Ref Range   Troponin i, poc 0.04 0.00 - 0.08 ng/mL   Comment 3           Imaging Review Dg Chest 2 View  12/25/2015  CLINICAL DATA:  Chest pain with recent pneumonia. EXAM: CHEST  2 VIEW COMPARISON:  12/23/2015. FINDINGS: AP and lateral view of the chest shows posterior loculated right pleural effusion, stable. Right chest tube remains in place. A second drainage catheter overlies the medial right lung and heart silhouette. Left lung is clear. Cardiopericardial silhouette is stable. IMPRESSION: Stable exam. Electronically Signed   By: Misty Stanley M.D.   On: 12/25/2015 22:57   I have personally reviewed and evaluated these images and lab results as part of my medical decision-making.   EKG Interpretation   Date/Time:  Friday December 25 2015 21:49:21 EST Ventricular Rate:  105 PR Interval:    QRS Duration: 105 QT Interval:  384 QTC Calculation: 507 R Axis:   -68 Text Interpretation:  Atrial fibrillation LAD, consider left anterior  fascicular block Anterior infarct, old Borderline ST depression,  anterolateral leads When compared with ECG of 12/10/2015, No significant  change was found Confirmed by [facility]  MD, Dyshaun Bonzo  ([id number]) on 12/25/2015  10:55:44 PM      MDM   Final diagnoses:  GERD without esophagitis  Atrial fibrillation, chronic (HCC)    Chest discomfort of uncertain cause. Old records are reviewed and need to have a heart catheterization in December 2016 showing no occlusion greater than 40%. Therefore, I do not feel that his discomfort is related to coronary artery disease. His pleural drainage catheter has been draining small amounts of fluid. Chest x-ray shows no change. He is anticoagulated on apixaban, so pulmonary embolism is considered very unlikely. Possible gastrointestinal source of pain. He was also recently hospitalized for pneumonia but no evidence of recurrent pneumonia. He is afebrile today. He will begin trial of GI cocktail. This most likely is esophageal reflux with spasm since he has no significant coronary obstructions and coronary spasm can cause similar pain and also resolve with nitroglycerin. Following check cocktail, had relief of symptoms. He is discharged with prescription for pantoprazole. Follow-up with PCP.  Delora Fuel, MD [id number]

## 2015-12-26 DIAGNOSIS — I482 Chronic atrial fibrillation: Secondary | ICD-10-CM | POA: Diagnosis not present

## 2015-12-26 LAB — BRAIN NATRIURETIC PEPTIDE: B Natriuretic Peptide: 273.9 pg/mL — ABNORMAL HIGH (ref 0.0–100.0)

## 2015-12-26 MED ORDER — PANTOPRAZOLE SODIUM 40 MG PO TBEC
40.0000 mg | DELAYED_RELEASE_TABLET | Freq: Once | ORAL | Status: AC
  Administered 2015-12-26: 40 mg via ORAL
  Filled 2015-12-26: qty 1

## 2015-12-26 MED ORDER — PANTOPRAZOLE SODIUM 40 MG PO TBEC: 40.0000 mg | DELAYED_RELEASE_TABLET | Freq: Every day | ORAL | Status: DC

## 2015-12-26 MED ORDER — GI COCKTAIL ~~LOC~~
30.0000 mL | Freq: Once | ORAL | Status: AC
  Administered 2015-12-26: 30 mL via ORAL
  Filled 2015-12-26: qty 30

## 2015-12-26 NOTE — ED Notes (Signed)
Secretary notified [facility] for transport back to nursing home.

## 2015-12-26 NOTE — Discharge Instructions (Signed)
Gastroesophageal Reflux Disease, Adult Normally, food travels down the esophagus and stays in the stomach to be digested. However, when a person has gastroesophageal reflux disease (GERD), food and stomach acid move back up into the esophagus. When this happens, the esophagus becomes sore and inflamed. Over time, GERD can create small holes (ulcers) in the lining of the esophagus.  CAUSES This condition is caused by a problem with the muscle between the esophagus and the stomach (lower esophageal sphincter, or LES). Normally, the LES muscle closes after food passes through the esophagus to the stomach. When the LES is weakened or abnormal, it does not close properly, and that allows food and stomach acid to go back up into the esophagus. The LES can be weakened by certain dietary substances, medicines, and medical conditions, including:  Tobacco use.  Pregnancy.  Having a hiatal hernia.  Heavy alcohol use.  Certain foods and beverages, such as coffee, chocolate, onions, and peppermint. RISK FACTORS This condition is more likely to develop in:  People who have an increased body weight.  People who have connective tissue disorders.  People who use NSAID medicines. SYMPTOMS Symptoms of this condition include:  Heartburn.  Difficult or painful swallowing.  The feeling of having a lump in the throat.  Abitter taste in the mouth.  Bad breath.  Having a large amount of saliva.  Having an upset or bloated stomach.  Belching.  Chest pain.  Shortness of breath or wheezing.  Ongoing (chronic) cough or a night-time cough.  Wearing away of tooth enamel.  Weight loss. Different conditions can cause chest pain. Make sure to see your health care provider if you experience chest pain. DIAGNOSIS Your health care provider will take a medical history and perform a physical exam. To determine if you have mild or severe GERD, your health care provider may also monitor how you respond  to treatment. You may also have other tests, including:  An endoscopy toexamine your stomach and esophagus with a small camera.  A test thatmeasures the acidity level in your esophagus.  A test thatmeasures how much pressure is on your esophagus.  A barium swallow or modified barium swallow to show the shape, size, and functioning of your esophagus. TREATMENT The goal of treatment is to help relieve your symptoms and to prevent complications. Treatment for this condition may vary depending on how severe your symptoms are. Your health care provider may recommend:  Changes to your diet.  Medicine.  Surgery. HOME CARE INSTRUCTIONS Diet  Follow a diet as recommended by your health care provider. This may involve avoiding foods and drinks such as:  Coffee and tea (with or without caffeine).  Drinks that containalcohol.  Energy drinks and sports drinks.  Carbonated drinks or sodas.  Chocolate and cocoa.  Peppermint and mint flavorings.  Garlic and onions.  Horseradish.  Spicy and acidic foods, including peppers, chili powder, curry powder, vinegar, hot sauces, and barbecue sauce.  Citrus fruit juices and citrus fruits, such as oranges, lemons, and limes.  Tomato-based foods, such as red sauce, chili, salsa, and pizza with red sauce.  Fried and fatty foods, such as donuts, french fries, potato chips, and high-fat dressings.  High-fat meats, such as hot dogs and fatty cuts of red and white meats, such as rib eye steak, sausage, ham, and bacon.  High-fat dairy items, such as whole milk, butter, and cream cheese.  Eat small, frequent meals instead of large meals.  Avoid drinking large amounts of liquid with your  meals.  Avoid eating meals during the 2-3 hours before bedtime.  Avoid lying down right after you eat.  Do not exercise right after you eat. General Instructions  Pay attention to any changes in your symptoms.  Take over-the-counter and prescription  medicines only as told by your health care provider. Do not take aspirin, ibuprofen, or other NSAIDs unless your health care provider told you to do so.  Do not use any tobacco products, including cigarettes, chewing tobacco, and e-cigarettes. If you need help quitting, ask your health care provider.  Wear loose-fitting clothing. Do not wear anything tight around your waist that causes pressure on your abdomen.  Raise (elevate) the head of your bed 6 inches (15cm).  Try to reduce your stress, such as with yoga or meditation. If you need help reducing stress, ask your health care provider.  If you are overweight, reduce your weight to an amount that is healthy for you. Ask your health care provider for guidance about a safe weight loss goal.  Keep all follow-up visits as told by your health care provider. This is important. SEEK MEDICAL CARE IF:  You have new symptoms.  You have unexplained weight loss.  You have difficulty swallowing, or it hurts to swallow.  You have wheezing or a persistent cough.  Your symptoms do not improve with treatment.  You have a hoarse voice. SEEK IMMEDIATE MEDICAL CARE IF:  You have pain in your arms, neck, jaw, teeth, or back.  You feel sweaty, dizzy, or light-headed.  You have chest pain or shortness of breath.  You vomit and your vomit looks like blood or coffee grounds.  You faint.  Your stool is bloody or black.  You cannot swallow, drink, or eat.   This information is not intended to replace advice given to you by your health care provider. Make sure you discuss any questions you have with your health care provider.   Document Released: 08/24/2005 Document Revised: 08/05/2015 Document Reviewed: 03/11/2015 Elsevier Interactive Patient Education 2016 [employer]  Pantoprazole tablets What is this medicine? PANTOPRAZOLE (pan TOE pra zole) prevents the production of acid in the stomach. It is used to treat gastroesophageal reflux  disease (GERD), inflammation of the esophagus, and Zollinger-Ellison syndrome. This medicine may be used for other purposes; ask your health care provider or pharmacist if you have questions. What should I tell my health care provider before I take this medicine? They need to know if you have any of these conditions: -liver disease -low levels of magnesium in the blood -an unusual or allergic reaction to omeprazole, lansoprazole, pantoprazole, rabeprazole, other medicines, foods, dyes, or preservatives -pregnant or trying to get pregnant -breast-feeding How should I use this medicine? Take this medicine by mouth. Swallow the tablets whole with a drink of water. Follow the directions on the prescription label. Do not crush, break, or chew. Take your medicine at regular intervals. Do not take your medicine more often than directed. Talk to your pediatrician regarding the use of this medicine in children. While this drug may be prescribed for children as young as 5 years for selected conditions, precautions do apply. Overdosage: If you think you have taken too much of this medicine contact a poison control center or emergency room at once. NOTE: This medicine is only for you. Do not share this medicine with others. What if I miss a dose? If you miss a dose, take it as soon as you can. If it is almost time for your  next dose, take only that dose. Do not take double or extra doses. What may interact with this medicine? Do not take this medicine with any of the following medications: -atazanavir -nelfinavir This medicine may also interact with the following medications: -ampicillin -delavirdine -erlotinib -iron salts -medicines for fungal infections like ketoconazole, itraconazole and voriconazole -methotrexate -mycophenolate mofetil -warfarin This list may not describe all possible interactions. Give your health care provider a list of all the medicines, herbs, non-prescription drugs, or  dietary supplements you use. Also tell them if you smoke, drink alcohol, or use illegal drugs. Some items may interact with your medicine. What should I watch for while using this medicine? It can take several days before your stomach pain gets better. Check with your doctor or health care professional if your condition does not start to get better, or if it gets worse. You may need blood work done while you are taking this medicine. What side effects may I notice from receiving this medicine? Side effects that you should report to your doctor or health care professional as soon as possible: -allergic reactions like skin rash, itching or hives, swelling of the face, lips, or tongue -bone, muscle or joint pain -breathing problems -chest pain or chest tightness -dark yellow or brown urine -dizziness -fast, irregular heartbeat -feeling faint or lightheaded -fever or sore throat -muscle spasm -palpitations -redness, blistering, peeling or loosening of the skin, including inside the mouth -seizures -tremors -unusual bleeding or bruising -unusually weak or tired -yellowing of the eyes or skin Side effects that usually do not require medical attention (Report these to your doctor or health care professional if they continue or are bothersome.): -constipation -diarrhea -dry mouth -headache -nausea This list may not describe all possible side effects. Call your doctor for medical advice about side effects. You may report side effects to FDA at 1-800-FDA-1088. Where should I keep my medicine? Keep out of the reach of children. Store at room temperature between 15 and 30 degrees C (59 and 86 degrees F). Protect from light and moisture. Throw away any unused medicine after the expiration date. NOTE: This sheet is a summary. It may not cover all possible information. If you have questions about this medicine, talk to your doctor, pharmacist, or health care provider.    2016, Elsevier/Gold  Standard. (2015-01-02 14:45:56)

## 2015-12-29 ENCOUNTER — Other Ambulatory Visit: Payer: Self-pay | Admitting: Surgery

## 2015-12-29 DIAGNOSIS — J90 Pleural effusion, not elsewhere classified: Secondary | ICD-10-CM

## 2015-12-30 ENCOUNTER — Ambulatory Visit (INDEPENDENT_AMBULATORY_CARE_PROVIDER_SITE_OTHER): Payer: Medicare Other | Admitting: Surgery

## 2015-12-30 ENCOUNTER — Other Ambulatory Visit: Payer: Self-pay | Admitting: *Deleted

## 2015-12-30 ENCOUNTER — Ambulatory Visit
Admission: RE | Admit: 2015-12-30 | Discharge: 2015-12-30 | Disposition: A | Discharge status: Home / Self Care | Payer: Medicare Other | Source: Ambulatory Visit | Attending: Surgery | Admitting: Surgery

## 2015-12-30 VITALS — BP 100/54 | HR 89 | Resp 16 | Ht 75.0 in | Wt 169.0 lb

## 2015-12-30 DIAGNOSIS — J90 Pleural effusion, not elsewhere classified: Secondary | ICD-10-CM

## 2015-12-30 DIAGNOSIS — I2510 Atherosclerotic heart disease of native coronary artery without angina pectoris: Secondary | ICD-10-CM

## 2015-12-30 DIAGNOSIS — J948 Other specified pleural conditions: Secondary | ICD-10-CM

## 2015-12-30 DIAGNOSIS — Z09 Encounter for follow-up examination after completed treatment for conditions other than malignant neoplasm: Secondary | ICD-10-CM | POA: Diagnosis not present

## 2016-01-01 ENCOUNTER — Encounter (HOSPITAL_COMMUNITY)
Admission: RE | Disposition: A | Discharge status: Skilled Nursing Facility | Payer: Self-pay | Source: Ambulatory Visit | Attending: Surgery

## 2016-01-01 ENCOUNTER — Encounter: Payer: Self-pay | Admitting: Surgery

## 2016-01-01 ENCOUNTER — Ambulatory Visit (HOSPITAL_COMMUNITY)
Admission: RE | Admit: 2016-01-01 | Discharge: 2016-01-01 | Disposition: A | Discharge status: Skilled Nursing Facility | Payer: Medicare Other | Source: Ambulatory Visit | Attending: Surgery | Admitting: Surgery

## 2016-01-01 DIAGNOSIS — I481 Persistent atrial fibrillation: Secondary | ICD-10-CM | POA: Insufficient documentation

## 2016-01-01 DIAGNOSIS — Z7901 Long term (current) use of anticoagulants: Secondary | ICD-10-CM | POA: Diagnosis not present

## 2016-01-01 DIAGNOSIS — I2510 Atherosclerotic heart disease of native coronary artery without angina pectoris: Secondary | ICD-10-CM | POA: Insufficient documentation

## 2016-01-01 DIAGNOSIS — I5022 Chronic systolic (congestive) heart failure: Secondary | ICD-10-CM | POA: Insufficient documentation

## 2016-01-01 DIAGNOSIS — E039 Hypothyroidism, unspecified: Secondary | ICD-10-CM | POA: Insufficient documentation

## 2016-01-01 DIAGNOSIS — Z79899 Other long term (current) drug therapy: Secondary | ICD-10-CM | POA: Diagnosis not present

## 2016-01-01 DIAGNOSIS — Z8509 Personal history of malignant neoplasm of other digestive organs: Secondary | ICD-10-CM | POA: Diagnosis not present

## 2016-01-01 DIAGNOSIS — E1122 Type 2 diabetes mellitus with diabetic chronic kidney disease: Secondary | ICD-10-CM | POA: Insufficient documentation

## 2016-01-01 DIAGNOSIS — Z87891 Personal history of nicotine dependence: Secondary | ICD-10-CM | POA: Insufficient documentation

## 2016-01-01 DIAGNOSIS — N400 Enlarged prostate without lower urinary tract symptoms: Secondary | ICD-10-CM | POA: Diagnosis not present

## 2016-01-01 DIAGNOSIS — Z794 Long term (current) use of insulin: Secondary | ICD-10-CM | POA: Insufficient documentation

## 2016-01-01 DIAGNOSIS — K219 Gastro-esophageal reflux disease without esophagitis: Secondary | ICD-10-CM | POA: Insufficient documentation

## 2016-01-01 DIAGNOSIS — I428 Other cardiomyopathies: Secondary | ICD-10-CM | POA: Insufficient documentation

## 2016-01-01 DIAGNOSIS — I130 Hypertensive heart and chronic kidney disease with heart failure and stage 1 through stage 4 chronic kidney disease, or unspecified chronic kidney disease: Secondary | ICD-10-CM | POA: Diagnosis not present

## 2016-01-01 DIAGNOSIS — Z4589 Encounter for adjustment and management of other implanted devices: Secondary | ICD-10-CM | POA: Diagnosis not present

## 2016-01-01 DIAGNOSIS — N182 Chronic kidney disease, stage 2 (mild): Secondary | ICD-10-CM | POA: Diagnosis not present

## 2016-01-01 DIAGNOSIS — Z85831 Personal history of malignant neoplasm of soft tissue: Secondary | ICD-10-CM | POA: Insufficient documentation

## 2016-01-01 DIAGNOSIS — J90 Pleural effusion, not elsewhere classified: Secondary | ICD-10-CM | POA: Diagnosis not present

## 2016-01-01 DIAGNOSIS — Z4689 Encounter for fitting and adjustment of other specified devices: Secondary | ICD-10-CM | POA: Diagnosis present

## 2016-01-01 HISTORY — PX: REMOVAL OF PLEURAL DRAINAGE CATHETER: SHX5080

## 2016-01-01 LAB — GLUCOSE, CAPILLARY: GLUCOSE-CAPILLARY: 122 mg/dL — AB (ref 65–99)

## 2016-01-01 SURGERY — REMOVAL, CLOSED DRAINAGE CATHETER SYSTEM, PLEURAL
Anesthesia: Monitor Anesthesia Care | Laterality: Right

## 2016-01-01 MED ORDER — LIDOCAINE HCL (PF) 1 % IJ SOLN
INTRAMUSCULAR | Status: AC
  Filled 2016-01-01: qty 30

## 2016-01-01 NOTE — Progress Notes (Signed)
HPI:  The patient returns today for follow up of his right PleurX catheter. It has only been draining about 50-60 cc every three days. We drained it in the office today and there is 30 cc.  Current Outpatient Prescriptions  Medication Sig Dispense Refill  . Amino Acids-Protein Hydrolys (FEEDING SUPPLEMENT, PRO-STAT SUGAR FREE 64,) LIQD Take 30 mLs by mouth 2 (two) times daily.    Marland Kitchen amiodarone (PACERONE) 200 MG tablet Take 1 tablet (200 mg total) by mouth 2 (two) times daily. 180 tablet 2  . apixaban (ELIQUIS) 2.5 MG TABS tablet Take 1 tablet (2.5 mg total) by mouth 2 (two) times daily. 60 tablet 2  . Ascorbic Acid (VITAMIN C) 500 MG tablet Take 500 mg by mouth daily.     Marland Kitchen azelastine (ASTELIN) 0.1 % nasal spray Place 2 sprays into both nostrils 2 (two) times daily. Use in each nostril as directed 90 mL 3  . digoxin (LANOXIN) 0.125 MG tablet Take 1 tablet (0.125 mg total) by mouth every other day. 90 tablet 2  . finasteride (PROSCAR) 5 MG tablet Take 1 tablet (5 mg total) by mouth daily. 90 tablet 3  . furosemide (LASIX) 20 MG tablet Take 1 tablet (20 mg total) by mouth every other day. 45 tablet 3  . glucosamine-chondroitin 500-400 MG tablet Take 1 tablet by mouth daily.    . insulin aspart (NOVOLOG FLEXPEN) 100 UNIT/ML FlexPen Inject 3-6 Units into the skin See admin instructions. Use 6 units at breakfast, use 3 units at lunch and use 5 units at supper    . levothyroxine (SYNTHROID, LEVOTHROID) 25 MCG tablet Take 1 tablet (25 mcg total) by mouth daily before breakfast. 30 tablet 5  . lipase/protease/amylase (CREON) 12000 UNITS CPEP capsule Take 1 capsule (12,000 Units total) by mouth 3 (three) times daily before meals. 270 capsule 3  . metoprolol tartrate (LOPRESSOR) 25 MG tablet Take 1 tablet (25 mg total) by mouth 2 (two) times daily. 180 tablet 3  . Multiple Vitamin (MULTIVITAMIN WITH MINERALS) TABS tablet Take 1 tablet by mouth daily.    . pantoprazole (PROTONIX) 40 MG tablet Take 1  tablet (40 mg total) by mouth daily. 30 tablet 0  . Probiotic Product (PROBIOTIC DAILY PO) Take 1 tablet by mouth 2 (two) times daily. With meals    . tamsulosin (FLOMAX) 0.4 MG CAPS capsule Take 1 capsule (0.4 mg total) by mouth 2 (two) times daily. 180 capsule 3   No current facility-administered medications for this visit.   Facility-Administered Medications Ordered in Other Visits  Medication Dose Route Frequency Provider Last Rate Last Dose  . lidocaine (PF) (XYLOCAINE) 1 % injection              Physical Exam: BP 100/54 mmHg  Pulse 89  Resp 16  Ht 6\' 3"  (1.905 m)  Wt 169 lb (76.658 kg)  BMI 21.12 kg/m2  SpO2 95% He looks weak Lung exam reveals clear breath sounds that may be a diminished a little on the right compared to the left. Moderate bilateral ankle edema.  Diagnostic Tests:   CLINICAL DATA: Pleural effusion, chest tube inserted on the right 12/04/15, CAD, DM, CHF, SOB , there are no other chest complaints (patient going to office now)  EXAM: CHEST 2 VIEW  COMPARISON: 12/25/2015  FINDINGS: Right-sided pleural catheter appears unchanged in position. Small amount of right pleural fluid is noted, similar in appearance to the previous exam. No evidence for pneumothorax. Left lung is clear.  Stable appearance of pulmonary artery catheter fragment.  IMPRESSION: Stable appearance of right pleural effusion and pleural drainage catheter. No pneumothorax.   Electronically Signed  By: Nolon Nations M.D.  On: 12/30/2015 12:26     Impression:  There is minimal drainage from the catheter and the CXR looks stable with no new pleural fluid collections. There is still some faint haziness in the lateral right hemithorax that could be some loculated fluid but it is unchanged and obviously not being drained by the catheter. The effusion was felt to be due to CHF. He will need continued medical management of his CHF to prevent recurrence.  Plan:  Will  schedule removal of the catheter in Short Stay.   Gaye Pollack, MD [facility] [phone]

## 2016-01-01 NOTE — Procedures (Signed)
Patient presents for right pleur x removal. After consent was obtained, patient's right pleur x catheter dressing was removed. The area was prepped and draped in a sterile fashion. Nylon suture was removed. 1 % xylocaine was used to achieve local anesthetic. The cuff of the right pleur x catheter was carefully dissected out. Right pleur x catheter was removed without difficulty. There was minor bloody ooze afterward. 2 sutures were placed. Small gauze and tape were applied. Patient tolerated the procedure well. He was instructed if he has and difficulty breathing or increased shortness of breath to go to ER. He was instructed to remove dressing in am and he may shower. He has a follow up appointment to see Dr. Cyndia Bent on 01/20/2016 at 3:30.

## 2016-01-01 NOTE — H&P (Signed)
[address]            [place], [address]          [phone]     [name] is an 82 y.o. male. February 03, 1925 [id number]   Chief Complaint: Right Pleur X removal  History of Presenting Illness:  This is an 82 year old Caucasian male who had undergone insertion of a right PleurX catheter on1/04/2016 by Dr. Cyndia Bent.  The etiology of his effusion is thought to be heart failure. He has been at [facility] for rehab but has not been able to walk yet. His legs are very weak after his bout of pneumonia. His catheter has been drained every 3 days and the first time it drained 600 cc, then 350 cc but only 40 cc and 50 cc the past two times. Chest x ray done 02/01 showed small amount of right pleural fluid is noted, similar in appearance to the previous exam. No evidence for pneumothorax. He presents to [facility] short stay for right Pleur x removal.  Past Medical History: Past Medical History  Diagnosis Date  . Acid reflux disease     history of  . H/O: GI bleed   . Persistent atrial fibrillation ([place])     a. Failed tikosyn. b. DCCV did not hold 07/2015.  Marland Kitchen Benign prostatic hypertrophy     history of  . Hepatic damage march 2009    retained hepatic stone , intrahepatic duct catheter  . CAD (coronary artery disease)     a. R/LHC 11/09/15: mild nonobstructive CAD ([facility]) with well- compensated hemodynamics with low filling pressures.  . Hypertension   . 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 Seizures ([place])   . Ventricular tachycardia ? Tikosyn proarrhtyhmia   . Cataracts, bilateral   . Diabetes mellitus     insulin dep  . Pleural effusion 10/2015  . Acute on chronic systolic CHF (congestive heart failure) ([place])   . CKD (chronic kidney disease), stage II   . HCAP (healthcare-associated pneumonia)  12/10/2015  . Hypothyroid   . Pancreatic insufficiency ([place])   . Systolic CHF [facility])     Past Surgical History: Past Surgical History  Procedure Laterality Date  . Cataract extraction w/ intraocular lens  implant, bilateral Bilateral ~ 2014  . Cholecystectomy    . Partial gastrectomy    . Orif shoulder fracture Left 1941    2 pens inserted  . Whipple procedure      operation  . Tumor excision Right     sarcoma removal from tricep  . Inguinal hernia repair Right X 2  . Appendectomy    . Cardioversion  03/07/2012    Procedure: CARDIOVERSION;  Surgeon: Deboraha Sprang, MD;  Location: [facility];  Service: Cardiovascular;  Laterality: N/A;  . Cardioversion N/A 08/10/2015    Procedure: CARDIOVERSION;  Surgeon: Thayer Headings, MD;  Location: [facility] ENDOSCOPY;  Service: Cardiovascular;  Laterality: N/A;  . Cardiac catheterization N/A 11/09/2015    Procedure: Right/Left Heart Cath and Coronary Angiography;  Surgeon: Jolaine Artist, MD;  Location: [facility] CV LAB;  Service: Cardiovascular;  Laterality: N/A;  . Chest tube insertion Right 12/04/2015    Procedure: INSERTION RIGHT  PLEURAL DRAINAGE CATHETER;  Surgeon: Gaye Pollack, MD;  Location: [facility] OR;  Service: Thoracic;  Laterality: Right;  . Fracture surgery    . Thoracentesis      Family History: Family History  Problem Relation Age of Onset  . Coronary artery disease Father 3  . Heart disease Father     fatal MI  . Osteoarthritis Mother 49  . Arthritis Mother   . Coronary artery disease Brother     cabg, avr, pvd with sents  . Heart disease Brother     CABG, PVD  . Peripheral vascular disease Sister 75  . Fibromyalgia Sister   . Arthritis Sister   . Coronary artery disease Sister   . Diabetes Sister   . Cancer Brother    Social History:   reports that he quit smoking about 24 years ago. His smoking use included Pipe. He has never used smokeless tobacco. He reports that he does not drink alcohol or use illicit  drugs.  Allergies:  Allergies  Allergen Reactions  . Lisinopril Cough  . Toujeo Solostar [Insulin Glargine] Other (See Comments)    Shaking, felt bad all over  . Clindamycin Diarrhea  . Other Other (See Comments)    toujeo with perioral tingling only - pt refuses to take further  . Oxycodone-Acetaminophen Diarrhea  . Penicillins Hives    Has patient had a PCN reaction causing immediate rash, facial/tongue/throat swelling, SOB or lightheadedness with hypotension: Yes Has patient had a PCN reaction causing severe rash involving mucus membranes or skin necrosis: no  Has patient had a PCN reaction that required hospitalization No Has patient had a PCN reaction occurring within the last 10 years: No If all of the above answers are "NO", then may proceed with Cephalosporin use.    Medications Prior to Admission  Medication Sig Dispense Refill  . Amino Acids-Protein Hydrolys (FEEDING SUPPLEMENT, PRO-STAT SUGAR FREE 64,) LIQD Take 30 mLs by mouth 2 (two) times daily.    Marland Kitchen amiodarone (PACERONE) 200 MG tablet Take 1 tablet (200 mg total) by mouth 2 (two) times daily. 180 tablet 2  . apixaban (ELIQUIS) 2.5 MG TABS tablet Take 1 tablet (2.5 mg total) by mouth 2 (two) times daily. 60 tablet 2  . Ascorbic Acid (VITAMIN C) 500 MG tablet Take 500 mg by mouth daily.     Marland Kitchen azelastine (ASTELIN) 0.1 % nasal spray Place 2 sprays into both nostrils 2 (two) times daily. Use in each nostril as directed 90 mL 3  . digoxin (LANOXIN) 0.125 MG tablet Take 1 tablet (0.125 mg total) by mouth every other day. 90 tablet 2  . finasteride (PROSCAR) 5 MG tablet Take 1 tablet (5 mg total) by mouth daily. 90 tablet 3  . furosemide (LASIX) 20 MG tablet Take 1 tablet (20 mg total) by mouth every other day. 45 tablet 3  . glucosamine-chondroitin 500-400 MG tablet Take 1 tablet by mouth daily.    . insulin aspart (NOVOLOG FLEXPEN) 100 UNIT/ML FlexPen Inject 3-6 Units into the skin See admin instructions. Use 6 units at  breakfast, use 3 units at lunch and use 5 units at supper    . levothyroxine (SYNTHROID, LEVOTHROID) 25 MCG tablet Take 1 tablet (25 mcg total) by mouth daily before breakfast. 30 tablet 5  . lipase/protease/amylase (CREON) 12000 UNITS CPEP capsule Take 1 capsule (12,000 Units total) by mouth 3 (three) times daily before meals. 270 capsule 3  . metoprolol tartrate (LOPRESSOR) 25 MG tablet Take 1 tablet (25  mg total) by mouth 2 (two) times daily. 180 tablet 3  . Multiple Vitamin (MULTIVITAMIN WITH MINERALS) TABS tablet Take 1 tablet by mouth daily.    . pantoprazole (PROTONIX) 40 MG tablet Take 1 tablet (40 mg total) by mouth daily. 30 tablet 0  . Probiotic Product (PROBIOTIC DAILY PO) Take 1 tablet by mouth 2 (two) times daily. With meals    . tamsulosin (FLOMAX) 0.4 MG CAPS capsule Take 1 capsule (0.4 mg total) by mouth 2 (two) times daily. 180 capsule 3   BP 122/36 mmHg  Pulse 87  Resp 18  SpO2 99%  Physical Exam: General appearance: alert, cooperative and no distress Lungs: Slightly diminished right base and left lung clear   Diagnostic Studies and Laboratory Results: Results for orders placed or performed during the hospital encounter of 01/01/16 (from the past 48 hour(s))  Glucose, capillary     Status: Abnormal   Collection Time: 01/01/16 10:25 AM  Result Value Ref Range   Glucose-Capillary 122 (H) 65 - 99 mg/dL   Dg Chest 2 View  12/30/2015  CLINICAL DATA:  Pleural effusion, chest tube inserted on the right 12/04/15, CAD, DM, CHF, SOB , there are no other chest complaints (patient going to office now) EXAM: CHEST  2 VIEW COMPARISON:  12/25/2015 FINDINGS: Right-sided pleural catheter appears unchanged in position. Small amount of right pleural fluid is noted, similar in appearance to the previous exam. No evidence for pneumothorax. Left lung is clear. Stable appearance of pulmonary artery catheter fragment. IMPRESSION: Stable appearance of right pleural effusion and pleural drainage  catheter. No pneumothorax. Electronically Signed   By: Nolon Nations M.D.   On: 12/30/2015 12:26     Assessment/Plan He presents for right Pleur x removal  Maryn Freelove M, PA-C 01/01/2016, 10:52 AM

## 2016-01-01 NOTE — Discharge Instructions (Signed)
D/C INSTRUCTIONS GIVE BY PA  donnilee Zimmerson.

## 2016-01-04 ENCOUNTER — Encounter: Payer: Medicare Other | Admitting: Physician Assistant

## 2016-01-04 NOTE — Progress Notes (Signed)
Cardiology Office Note Date:  01/05/2016  Patient ID:  [name] 1925/04/17, MRN [id number] PCP:  [name], MD   Electrophysiologist: Dr. Caryl Comes    Chief Complaint:  Post hospital evaluation  History of Present Illness: [name] is a 82 y.o. 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   Recently discharged from [facility] 11/12/15 with acute CHF, CAP, difficult rate to control with his AF and found with new CM with a large L pleural effusion requiring 2 thoras, d/c summary states, CT chest shows a moderate-sized right pleural effusion, questionable right lower lobe pneumonia, completed treatment with levofloxacin 8 days, Chronic fragment from a catheter measuring 12.5 cm which was present on prior CT, patient states he has had it for 20 years.  He was diuresed, rate was controlled and eventually discharged. His coreg was changed to metoprolol, unable to tolerate ARB with soft BP and has allergy to lisinopril (cough), he had LHC without obstructive disease, his AF rate was difficult to control, he was put on metoprolol, then dig and eventually back on amiodarone.  He was seen by myself in discussion with Dr. Caryl Comes 11/26/16 feeling tired but in relation to how he had been feeling, felt like he was improving.  He had labs done, his amiodarone and his dig was decreased given his amiodarone.  He was started on Synthroid therapy with elevated TSH, and temporarily his lasix increased with edema.  He did a CXR via his PMD afterwards and was fund to have reaccumulation of his R pleural effusion and had a pleurX catheter placed 12/04/15, he was discharged 12/06/15 only to readmit 12/10/15 with worsening SOB despite the catheter and [facility]  RN draining it noting that time with an infiltrate and reaccumulation of fluid treated with antibiotics and discharged again with the pleurX catheter and HHC.  He was followed with CTS, Dr. Cyndia Bent and with minimal ongoing drainage was removed 01/01/16.  He had been at [facility] for rehab, at one point not ambulating at all with marked functional decline.  He comes to the office today accompanied by his wife and son feeling like he is making slow/steady improvement at rehab.  His son remarks he went from barely able to lift his head up to now walking about 750 feet.  The patient denies any CP, palpitations, denies any rest SOB, uses 2 pillows to sleep at noght denies symptoms of PND or orthopnea, he does have DOE with minimal exertion but this he says is also slowly improving.  He has not had near syncope or syncope.  He states his weight at rehab has maintained 159-161lbs, and his extent of edema today is ablout where he has been since his last hospital stay.    Past Medical History  Diagnosis Date  . Acid reflux disease     history of  . H/O: GI bleed   . Persistent atrial fibrillation ([place])     a. Failed tikosyn. b. DCCV did not hold 07/2015.  Marland Kitchen Benign prostatic hypertrophy     history of  . Hepatic damage march 2009    retained hepatic stone , intrahepatic duct catheter  . CAD (coronary artery disease)     a. R/LHC 11/09/15: mild nonobstructive  CAD ([facility]) with well- compensated hemodynamics with low filling pressures.  . Hypertension   . 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 Seizures ([place])   . Ventricular tachycardia ? Tikosyn proarrhtyhmia   . Cataracts, bilateral   . Diabetes mellitus     insulin dep  . Pleural effusion 10/2015  . Acute on chronic systolic CHF (congestive heart failure) ([place])   . CKD (chronic kidney disease), stage II    . HCAP (healthcare-associated pneumonia) 12/10/2015  . Hypothyroid   . Pancreatic insufficiency ([place])   . Systolic CHF [facility])     Past Surgical History  Procedure Laterality Date  . Cataract extraction w/ intraocular lens  implant, bilateral Bilateral ~ 2014  . Cholecystectomy    . Partial gastrectomy    . Orif shoulder fracture Left 1941    2 pens inserted  . Whipple procedure      operation  . Tumor excision Right     sarcoma removal from tricep  . Inguinal hernia repair Right X 2  . Appendectomy    . Cardioversion  03/07/2012    Procedure: CARDIOVERSION;  Surgeon: Deboraha Sprang, MD;  Location: [place];  Service: Cardiovascular;  Laterality: N/A;  . Cardioversion N/A 08/10/2015    Procedure: CARDIOVERSION;  Surgeon: Thayer Headings, MD;  Location: [facility] ENDOSCOPY;  Service: Cardiovascular;  Laterality: N/A;  . Cardiac catheterization N/A 11/09/2015    Procedure: Right/Left Heart Cath and Coronary Angiography;  Surgeon: Jolaine Artist, MD;  Location: [place] CV LAB;  Service: Cardiovascular;  Laterality: N/A;  . Chest tube insertion Right 12/04/2015    Procedure: INSERTION RIGHT PLEURAL DRAINAGE CATHETER;  Surgeon: Gaye Pollack, MD;  Location: [place] OR;  Service: Thoracic;  Laterality: Right;  . Fracture surgery    . Thoracentesis    . Removal of pleural drainage catheter Right 01/01/2016    Procedure: REMOVAL OF PLEURAL DRAINAGE CATHETER;  Surgeon: Gaye Pollack, MD;  Location: [facility] OR;  Service: Thoracic;  Laterality: Right;    Current Outpatient Prescriptions  Medication Sig Dispense Refill  . Amino Acids-Protein Hydrolys (FEEDING SUPPLEMENT, PRO-STAT SUGAR FREE 64,) LIQD Take 30 mLs by mouth 2 (two) times daily.    Marland Kitchen amiodarone (PACERONE) 200 MG tablet Take 1 tablet (200 mg total) by mouth 2 (two) times daily. 180 tablet 2  . apixaban (ELIQUIS) 2.5 MG TABS tablet Take 1 tablet (2.5 mg total) by mouth 2 (two) times daily. 60 tablet 2  . Ascorbic Acid (VITAMIN C) 500 MG tablet  Take 500 mg by mouth daily.     Marland Kitchen azelastine (ASTELIN) 0.1 % nasal spray Place 2 sprays into both nostrils 2 (two) times daily. Use in each nostril as directed 90 mL 3  . digoxin (LANOXIN) 0.125 MG tablet Take 1 tablet (0.125 mg total) by mouth every other day. 90 tablet 2  . finasteride (PROSCAR) 5 MG tablet Take 1 tablet (5 mg total) by mouth daily. 90 tablet 3  . furosemide (LASIX) 20 MG tablet Take 1 tablet (20 mg total) by mouth every other day. 45 tablet 3  . glucosamine-chondroitin 500-400 MG tablet Take 1 tablet by mouth daily.    . insulin aspart (NOVOLOG FLEXPEN) 100 UNIT/ML FlexPen Inject 3-6 Units into the skin See admin instructions. Use 6 units at breakfast, use 3 units  at lunch and use 5 units at supper    . levothyroxine (SYNTHROID, LEVOTHROID) 25 MCG tablet Take 1 tablet (25 mcg total) by mouth daily before breakfast. 30 tablet 5  . lipase/protease/amylase (CREON) 12000 UNITS CPEP capsule Take 1 capsule (12,000 Units total) by mouth 3 (three) times daily before meals. 270 capsule 3  . metoprolol tartrate (LOPRESSOR) 25 MG tablet Take 1 tablet (25 mg total) by mouth 2 (two) times daily. 180 tablet 3  . Multiple Vitamin (MULTIVITAMIN WITH MINERALS) TABS tablet Take 1 tablet by mouth daily.    . pantoprazole (PROTONIX) 40 MG tablet Take 1 tablet (40 mg total) by mouth daily. 30 tablet 0  . Probiotic Product (PROBIOTIC DAILY PO) Take 1 tablet by mouth 2 (two) times daily. With meals    . tamsulosin (FLOMAX) 0.4 MG CAPS capsule Take 1 capsule (0.4 mg total) by mouth 2 (two) times daily. 180 capsule 3   No current facility-administered medications for this visit.    Allergies:   Lisinopril; Toujeo solostar; Clindamycin; Other; Oxycodone-acetaminophen; and Penicillins   Social History:  The patient  reports that he quit smoking about 24 years ago. His smoking use included Pipe. He has never used smokeless tobacco. He reports that he does not drink alcohol or use illicit drugs.    Family History:  The patient's family history includes Arthritis in his mother and sister; Cancer in his brother; Coronary artery disease in his brother and sister; Coronary artery disease (age of onset: 29) in his father; Diabetes in his sister; Fibromyalgia in his sister; Heart disease in his brother and father; Osteoarthritis (age of onset: 64) in his mother; Peripheral vascular disease (age of onset: 21) in his sister.  ROS:  Please see the history of present illness.  All other systems are reviewed and otherwise negative.   PHYSICAL EXAM:  VS:  BP 114/68 mmHg  Pulse 101  Ht 6\' 2"  (1.88 m)  Wt 179 lb (81.194 kg)  BMI 22.97 kg/m2 BMI: Body mass index is 22.97 kg/(m^2).  His O2 sat on RA is 96% Thin, elderly WM, in no acute distress, frail in appearance, thin body habitus HEENT: normocephalic, atraumatic Neck: no JVD, carotid bruits or masses Cardiac:  irreg-irreg rate/rhythm, 2/6SM, no rubs, or gallops Lungs:  CTA b/l  no crackles, no wheezing, rhonchi or rales Abd: soft, nontender MS: no deformity, age appropriate atrophy Ext: 1-2+ edema to mid-shin b/l Skin: warm and dry, no rash Neuro:  No gross deficits appreciated Psych: euthymic mood, full affect  EKG:  Done 11/27/15 shows AFib 57bpm  11/09/15 LHC 1. Mild non-obstructive CAD 2. Nonischemic CM with EF 25% by echo 3. Chronic AF 4. Well-compensated hemodynamics with low filling pressures  11/05/15: Echocardiogram Study Conclusions - Left ventricle: The cavity size was normal. Systolic function was severely reduced. The estimated ejection fraction was in the range of 25% to 30%. Diffuse hypokinesis. - Aortic valve: Trileaflet; mildly thickened, mildly calcified leaflets. Cusp separation was reduced. - Aorta: The aorta was moderately calcified. - Mitral valve: Moderately calcified annulus. There was mild regurgitation. - Left atrium: The atrium was moderately dilated. - Right ventricle: The cavity size was  moderately dilated. Wall thickness was normal. Systolic function was moderately reduced. - Right atrium: The atrium was severely dilated. - Tricuspid valve: There was moderate regurgitation. - Pulmonic valve: There was moderate regurgitation. Impressions: - EF is reduced when compared to prior study (50%).   Recent Labs: 11/04/2015: Pro B Natriuretic peptide (BNP) 504.0* 12/03/2015:  TSH 8.142* 12/11/2015: Magnesium 1.7 12/13/2015: ALT 21 12/25/2015: B Natriuretic Peptide 273.9*; BUN 19; Creatinine, Ser 1.19; Hemoglobin 13.1; Platelets 237; Potassium 4.1; Sodium 136  10/21/2015: Cholesterol 114; HDL 46.20; LDL Cholesterol 56; Total CHOL/HDL Ratio 2; Triglycerides 59.0; VLDL 11.8  11/04/15: TSH 6.858 11/05/15 free T4 1.00 (wnl)  Estimated Creatinine Clearance: 49.3 mL/min (by C-G formula based on Cr of 1.19).   Wt Readings from Last 3 Encounters:  01/05/16 179 lb (81.194 kg)  12/30/15 169 lb (76.658 kg)  12/25/15 159 lb (72.122 kg)     Other studies reviewed: Additional studies/records reviewed today include: summarized above  ASSESSMENT AND PLAN:  1. persistent AFib October his amio was stopped with elevated TSH, at his hospital stay in December it was decided to resume given difficulty with rate management, noted abn TSH and normal free T4, plan was to monitor this out patient and continue amiodarone, out patient he was started on Synthroid in Dec. 2016 CHADS2Vasc is at least 5 on Eliquis 2.5mg  BID Dr. Caryl Comes spoke with the patient/family today, if HR control becomes an issue/felt to be exacerbating his CHF, may ned to visit CRT-P and AV node ablation  2. CHF New NICM dec 2016 BP unable to tolerate ARB, and intolerant/allergic to ACE Recurrent hospitalizations with fluid OL, large R pleural effusion has required a number of thoracentesis', and most recently a PleurX cath he had in placed 12/04/15 and removed 01/01/16  3. HTN  soft BP described for this patient in his  records   Disposition: No changes at this time, he has made steady improvement with rehab, his edema/weight appear to be stable by their reports.  He has f/u scheduled with Dr. Cyndia Bent, we drew for dig level, will see hm back in 4-6 weeks.  Current medicines are reviewed at length with the patient today.  The patient did not have any concerns regarding medicines.  Haywood Lasso, PA-C 01/05/2016 2:05 PM     [address] [place] [address] [phone] (office)  [phone] (fax)

## 2016-01-05 ENCOUNTER — Encounter (HOSPITAL_COMMUNITY): Payer: Self-pay | Admitting: Surgery

## 2016-01-05 ENCOUNTER — Ambulatory Visit (INDEPENDENT_AMBULATORY_CARE_PROVIDER_SITE_OTHER): Payer: Medicare Other | Admitting: Physician Assistant

## 2016-01-05 ENCOUNTER — Ambulatory Visit (HOSPITAL_COMMUNITY): Payer: Medicare Other | Admitting: Nurse Practitioner

## 2016-01-05 VITALS — BP 114/68 | HR 101 | Ht 74.0 in | Wt 179.0 lb

## 2016-01-05 DIAGNOSIS — I5022 Chronic systolic (congestive) heart failure: Secondary | ICD-10-CM | POA: Diagnosis not present

## 2016-01-05 DIAGNOSIS — I4891 Unspecified atrial fibrillation: Secondary | ICD-10-CM | POA: Diagnosis not present

## 2016-01-05 DIAGNOSIS — I2510 Atherosclerotic heart disease of native coronary artery without angina pectoris: Secondary | ICD-10-CM

## 2016-01-05 NOTE — Patient Instructions (Addendum)
Medication Instructions:   Your physician recommends that you continue on your current medications as directed. Please refer to the Current Medication list given to you today.   If you need a refill on your cardiac medications before your next appointment, please call your pharmacy.  Labwork: DIGOXIN   Testing/Procedures:  NONE ORDER TODAY    Follow-Up:  4 TO 6 WEEKS WITH KLEIN OR URSUY   Any Other Special Instructions Will Be Listed Below (If Applicable).

## 2016-01-06 ENCOUNTER — Inpatient Hospital Stay (HOSPITAL_COMMUNITY): Admission: RE | Admit: 2016-01-06 | Payer: Medicare Other | Source: Ambulatory Visit | Admitting: Nurse Practitioner

## 2016-01-06 ENCOUNTER — Telehealth: Payer: Self-pay | Admitting: *Deleted

## 2016-01-06 LAB — DIGOXIN LEVEL: Digoxin Level: 0.6 ug/L — ABNORMAL LOW (ref 0.8–2.0)

## 2016-01-06 NOTE — Telephone Encounter (Signed)
-----   Message from [facility], [place] sent at 01/06/2016  8:39 AM EST ----- Please let the patient know his digoxin level is OK, no changes.  Thanks, [employer]

## 2016-01-07 ENCOUNTER — Non-Acute Institutional Stay (SKILLED_NURSING_FACILITY): Payer: Medicare Other | Admitting: Family

## 2016-01-07 DIAGNOSIS — E118 Type 2 diabetes mellitus with unspecified complications: Secondary | ICD-10-CM

## 2016-01-07 DIAGNOSIS — N138 Other obstructive and reflux uropathy: Secondary | ICD-10-CM

## 2016-01-07 DIAGNOSIS — J90 Pleural effusion, not elsewhere classified: Secondary | ICD-10-CM | POA: Diagnosis not present

## 2016-01-07 DIAGNOSIS — I5022 Chronic systolic (congestive) heart failure: Secondary | ICD-10-CM | POA: Diagnosis not present

## 2016-01-07 DIAGNOSIS — N401 Enlarged prostate with lower urinary tract symptoms: Secondary | ICD-10-CM

## 2016-01-07 DIAGNOSIS — E039 Hypothyroidism, unspecified: Secondary | ICD-10-CM

## 2016-01-07 DIAGNOSIS — D638 Anemia in other chronic diseases classified elsewhere: Secondary | ICD-10-CM

## 2016-01-07 DIAGNOSIS — I2510 Atherosclerotic heart disease of native coronary artery without angina pectoris: Secondary | ICD-10-CM | POA: Diagnosis not present

## 2016-01-07 NOTE — Progress Notes (Signed)
Patient ID: LORNA STAHLBERG, male   DOB: Sep 11, 1925, 82 y.o.   MRN: [id number]  Location: [facility]  Provider: Blanchie Serve, MD   PCP: [name], MD  Code Status: Full Code  Goals of care:  Advanced Directive information Advanced Directives 12/25/2015  Does patient have an advance directive? No  Type of Advance Directive -  Does patient want to make changes to advanced directive? -  Copy of advanced directive(s) in chart? -     Allergies  Allergen Reactions  . Lisinopril Cough  . Toujeo Solostar [Insulin Glargine] Other (See Comments)    Shaking, felt bad all over  . Clindamycin Diarrhea  . Other Other (See Comments)    toujeo with perioral tingling only - pt refuses to take further  . Oxycodone-Acetaminophen Diarrhea  . Penicillins Hives    Has patient had a PCN reaction causing immediate rash, facial/tongue/throat swelling, SOB or lightheadedness with hypotension: Yes Has patient had a PCN reaction causing severe rash involving mucus membranes or skin necrosis: no  Has patient had a PCN reaction that required hospitalization No Has patient had a PCN reaction occurring within the last 10 years: No If all of the above answers are "NO", then may proceed with Cephalosporin use.    Chief Complaint  Patient presents with  . Discharge Note    HPI:   82 y.o. male  Seen at [facility] for discharge Home.He is here for short term rehabilitation post hospital admission from 12/10/15-12/14/15 with HCAP and right sided pleural effusion. He was started on antibiotics and had pleurx catheter drained.He has a past medical History of CHF, Type 2 DM, BPH, CAD, Hypothyroidism among others.His recent CXR 01/07/2016 showed Patchy infiltrate in right mid and lower lung consistent with multifocal infectious process with small right pleural effusion.He denies any cough, shortness of breath, wheezing,chills or fever.Will recheck CBC/diff, F/U CXR  and treat if indicated prior to discharge home. He has Worked with PT/OT with much improvement and will need PT/OT at home for ROM, exercise, Gait stability and Muscle strengthening. He will also require [place] [facility] to assist with ADL's. He will also require a 3-1 bedside commode due to gait stability for fall precautions.    Review of Systems  Constitutional: Negative for fever and chills.       Weakness to lower extremities.   HENT: Negative.   Eyes: Negative.   Respiratory: Negative for cough, sputum production, shortness of breath and wheezing.   Cardiovascular: Positive for leg swelling. Negative for chest pain, palpitations, orthopnea and PND.  Gastrointestinal: Negative.   Genitourinary: Negative.   Musculoskeletal: Negative.   Skin: Negative.   Neurological: Negative.   Endo/Heme/Allergies: Negative.   Psychiatric/Behavioral: Negative.     Past Medical History  Diagnosis Date  . Acid reflux disease     history of  . H/O: GI bleed   . Persistent atrial fibrillation ([place])     a. Failed tikosyn. b. DCCV did not hold 07/2015.  Marland Kitchen Benign prostatic hypertrophy     history of  . Hepatic damage march 2009    retained hepatic stone , intrahepatic duct catheter  . CAD (coronary artery disease)     a. R/LHC 11/09/15: mild nonobstructive CAD ([facility]) with well- compensated hemodynamics with low filling pressures.  . Hypertension   . 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 Seizures ([place])   . Ventricular tachycardia ? Tikosyn proarrhtyhmia   . Cataracts, bilateral   . Diabetes mellitus     insulin dep  . Pleural effusion 10/2015  . Acute on chronic systolic CHF (congestive heart failure) ([place])   . CKD (chronic kidney disease), stage II   . HCAP (healthcare-associated pneumonia) 12/10/2015  . Hypothyroid   . Pancreatic insufficiency ([place])   .  Systolic CHF [facility])     Past Surgical History  Procedure Laterality Date  . Cataract extraction w/ intraocular lens  implant, bilateral Bilateral ~ 2014  . Cholecystectomy    . Partial gastrectomy    . Orif shoulder fracture Left 1941    2 pens inserted  . Whipple procedure      operation  . Tumor excision Right     sarcoma removal from tricep  . Inguinal hernia repair Right X 2  . Appendectomy    . Cardioversion  03/07/2012    Procedure: CARDIOVERSION;  Surgeon: Deboraha Sprang, MD;  Location: [place] [facility];  Service: Cardiovascular;  Laterality: N/A;  . Cardioversion N/A 08/10/2015    Procedure: CARDIOVERSION;  Surgeon: Thayer Headings, MD;  Location: [facility] ENDOSCOPY;  Service: Cardiovascular;  Laterality: N/A;  . Cardiac catheterization N/A 11/09/2015    Procedure: Right/Left Heart Cath and Coronary Angiography;  Surgeon: Jolaine Artist, MD;  Location: [place] CV LAB;  Service: Cardiovascular;  Laterality: N/A;  . Chest tube insertion Right 12/04/2015    Procedure: INSERTION RIGHT PLEURAL DRAINAGE CATHETER;  Surgeon: Gaye Pollack, MD;  Location: [place] OR;  Service: Thoracic;  Laterality: Right;  . Fracture surgery    . Thoracentesis    . Removal of pleural drainage catheter Right 01/01/2016    Procedure: REMOVAL OF PLEURAL DRAINAGE CATHETER;  Surgeon: Gaye Pollack, MD;  Location: [place] OR;  Service: Thoracic;  Laterality: Right;      reports that he quit smoking about 24 years ago. His smoking use included Pipe. He has never used smokeless tobacco. He reports that he does not drink alcohol or use illicit drugs.  Allergies  Allergen Reactions  . Lisinopril Cough  . Toujeo Solostar [Insulin Glargine] Other (See Comments)    Shaking, felt bad all over  . Clindamycin Diarrhea  . Other Other (See Comments)    toujeo with perioral tingling only - pt refuses to take further  . Oxycodone-Acetaminophen Diarrhea  . Penicillins Hives    Has patient had a PCN reaction causing immediate rash,  facial/tongue/throat swelling, SOB or lightheadedness with hypotension: Yes Has patient had a PCN reaction causing severe rash involving mucus membranes or skin necrosis: no  Has patient had a PCN reaction that required hospitalization No Has patient had a PCN reaction occurring within the last 10 years: No If all of the above answers are "NO", then may proceed with Cephalosporin use.    Pertinent  Health Maintenance Due  Topic Date Due  . FOOT EXAM  02/25/2016  . URINE MICROALBUMIN  02/25/2016  . OPHTHALMOLOGY EXAM  03/30/2016  . HEMOGLOBIN A1C  04/19/2016  . INFLUENZA VACCINE  06/28/2016  . PNA vac Low Risk Adult  Completed    Medications:   Medication List       This list is accurate as of: 01/07/16  3:13 PM.  Always use your most recent med list.               amiodarone 200 MG  tablet  Commonly known as:  PACERONE  Take 1 tablet (200 mg total) by mouth 2 (two) times daily.     apixaban 2.5 MG Tabs tablet  Commonly known as:  ELIQUIS  Take 1 tablet (2.5 mg total) by mouth 2 (two) times daily.     azelastine 0.1 % nasal spray  Commonly known as:  ASTELIN  Place 2 sprays into both nostrils 2 (two) times daily. Use in each nostril as directed     digoxin 0.125 MG tablet  Commonly known as:  LANOXIN  Take 1 tablet (0.125 mg total) by mouth every other day.     feeding supplement (PRO-STAT SUGAR FREE 64) Liqd  Take 30 mLs by mouth 2 (two) times daily.     finasteride 5 MG tablet  Commonly known as:  PROSCAR  Take 1 tablet (5 mg total) by mouth daily.     furosemide 20 MG tablet  Commonly known as:  LASIX  Take 1 tablet (20 mg total) by mouth every other day.     glucosamine-chondroitin 500-400 MG tablet  Take 1 tablet by mouth daily.     levothyroxine 25 MCG tablet  Commonly known as:  SYNTHROID, LEVOTHROID  Take 1 tablet (25 mcg total) by mouth daily before breakfast.     lipase/protease/amylase 12000 units Cpep capsule  Commonly known as:  CREON  Take 1  capsule (12,000 Units total) by mouth 3 (three) times daily before meals.     metoprolol tartrate 25 MG tablet  Commonly known as:  LOPRESSOR  Take 1 tablet (25 mg total) by mouth 2 (two) times daily.     multivitamin with minerals Tabs tablet  Take 1 tablet by mouth daily.     NOVOLOG FLEXPEN 100 UNIT/ML FlexPen  Generic drug:  insulin aspart  Inject 3-6 Units into the skin See admin instructions. Use 6 units at breakfast, use 3 units at lunch and use 5 units at supper     pantoprazole 40 MG tablet  Commonly known as:  PROTONIX  Take 1 tablet (40 mg total) by mouth daily.     PROBIOTIC DAILY PO  Take 1 tablet by mouth 2 (two) times daily. With meals     tamsulosin 0.4 MG Caps capsule  Commonly known as:  FLOMAX  Take 1 capsule (0.4 mg total) by mouth 2 (two) times daily.     vitamin C 500 MG tablet  Commonly known as:  ASCORBIC ACID  Take 500 mg by mouth daily.         Filed Vitals:   01/07/16 1440  BP: 100/60  Pulse: 89  Temp: 97.9 F (36.6 C)  Resp: 20  Weight: 160 lb 1.6 oz (72.621 kg)  SpO2: 94%   Body mass index is 20.55 kg/(m^2). Physical Exam  Constitutional: He appears well-developed and well-nourished.  Elderly in no acute distress.   HENT:  Head: Normocephalic.  Mouth/Throat: Oropharynx is clear and moist.  Eyes: EOM are normal. Pupils are equal, round, and reactive to light. Right eye exhibits no discharge. Left eye exhibits no discharge. No scleral icterus.  Neck: Normal range of motion. No JVD present. No tracheal deviation present. No thyromegaly present.  Cardiovascular: Normal rate, regular rhythm, normal heart sounds and intact distal pulses.  Exam reveals no gallop and no friction rub.   No murmur heard. Pulmonary/Chest: Effort normal. No respiratory distress. He has no wheezes. He has no rales.  Right lung diminished breath sounds noted  Abdominal: Soft. Bowel sounds  are normal. He exhibits no distension and no mass. There is no tenderness.  There is no guarding.  Musculoskeletal: Normal range of motion. He exhibits no tenderness.  Bilateral LE's +1 edema   Lymphadenopathy:    He has no cervical adenopathy.    Labs reviewed: Basic Metabolic Panel:  Recent Labs  11/04/15 1312  11/12/15 0645  12/11/15 0646 12/12/15 0546 12/13/15 0611 12/25/15 2217  NA 135  < > 135  < > 134* 133* 133* 136  K 4.5  < > 4.2  < > 4.1 4.2 3.8 4.1  CL 104  < > 100*  < > 102 100* 98* 95*  CO2 26  < > 27  < > 28 26 26 31   GLUCOSE 115*  < > 155*  < > 148* 122* 127* 159*  BUN 18  < > 26*  < > 16 16 17 19   CREATININE 1.16  < > 1.17  < > 1.10 1.01 1.08 1.19  CALCIUM 8.7*  < > 8.4*  < > 7.9* 7.9* 7.8* 8.1*  MG 2.0  --  2.0  --  1.7  --   --   --   PHOS  --   --   --   --  2.5  --   --   --   < > = values in this interval not displayed. Liver Function Tests:  Recent Labs  12/11/15 0646 12/12/15 0546 12/13/15 0611  AST 25 28 35  ALT 20 19 21   ALKPHOS 109 110 106  BILITOT 1.0 0.6 0.7  PROT 4.9* 4.9* 5.0*  ALBUMIN 2.0* 1.9* 1.8*    Recent Labs  06/24/15 1511  LIPASE 10.0*   No results for input(s): AMMONIA in the last 8760 hours. CBC:  Recent Labs  11/04/15 1312  12/10/15 1730 12/11/15 0646 12/25/15 2217  WBC 7.1  < > 8.5 6.7 10.2  NEUTROABS 4.5  --  5.6 4.2  --   HGB 11.8*  < > 12.9* 12.3* 13.1  HCT 35.6*  < > 38.6* 37.1* 38.8*  MCV 95.7  < > 95.3 96.1 95.1  PLT 151  < > 159 145* 237  < > = values in this interval not displayed. Cardiac Enzymes:  Recent Labs  11/04/15 1300  CKTOTAL 42  CKMB 2.5   BNP: Invalid input(s): POCBNP CBG:  Recent Labs  12/15/15 0804 12/15/15 1155 01/01/16 1025  GLUCAP 138* 189* 122*    Procedures and Imaging Studies During Stay: Dg Chest 2 View  12/30/2015  CLINICAL DATA:  Pleural effusion, chest tube inserted on the right 12/04/15, CAD, DM, CHF, SOB , there are no other chest complaints (patient going to office now) EXAM: CHEST  2 VIEW COMPARISON:  12/25/2015 FINDINGS:  Right-sided pleural catheter appears unchanged in position. Small amount of right pleural fluid is noted, similar in appearance to the previous exam. No evidence for pneumothorax. Left lung is clear. Stable appearance of pulmonary artery catheter fragment. IMPRESSION: Stable appearance of right pleural effusion and pleural drainage catheter. No pneumothorax. Electronically Signed   By: Nolon Nations M.D.   On: 12/30/2015 12:26   Dg Chest 2 View  12/25/2015  CLINICAL DATA:  Chest pain with recent pneumonia. EXAM: CHEST  2 VIEW COMPARISON:  12/23/2015. FINDINGS: AP and lateral view of the chest shows posterior loculated right pleural effusion, stable. Right chest tube remains in place. A second drainage catheter overlies the medial right lung and heart silhouette. Left lung is clear. Cardiopericardial silhouette  is stable. IMPRESSION: Stable exam. Electronically Signed   By: Misty Stanley M.D.   On: 12/25/2015 22:57   Dg Chest 2 View  12/23/2015  CLINICAL DATA:  Pleural effusion.  Pleural drainage catheters. EXAM: CHEST  2 VIEW COMPARISON:  12/10/2015. FINDINGS: Right pleural drainage tubes in stable position. Persistent right pleural fluid collection. Mild atelectasis and/or infiltrate right lung base again noted. Left lung clear. Stable cardiomegaly. No pneumothorax. No acute bony abnormality . IMPRESSION: Right pleural drainage catheters in stable position. Persistent right pleural fluid collection. Mild atelectasis and/or infiltrate right lung base again no. Electronically Signed   By: [place]   On: 12/23/2015 12:16   Dg Chest 2 View  12/10/2015  CLINICAL DATA:  Shortness of breath.  Fever.  Weakness. EXAM: CHEST  2 VIEW COMPARISON:  12/04/2015 chest radiograph. FINDINGS: Catheter fragment appears stable in position in the mediastinum and anterior mid to lower right lung. Right basilar chest tube is stable. Stable cardiomediastinal silhouette with mild cardiomegaly. No pneumothorax. There is  interval reaccumulation of a small posterior basilar right pleural effusion. No left pleural effusion. There is new patchy opacity in the mid to lower right lung. There is stable mild patchy opacity in the medial left lung base. No overt pulmonary edema. IMPRESSION: 1. Interval reaccumulation of a small posterior basilar right pleural effusion despite stable position of the basilar right chest tube. No pneumothorax. 2. New patchy opacity in the mid to lower right lung, suspicious for new pneumonia. 3. Stable mild patchy opacity in the medial left lung base, favor atelectasis. 4. Stable mild cardiomegaly without overt pulmonary edema. Electronically Signed   By: Ilona Sorrel M.D.   On: 12/10/2015 17:25    Assessment/Plan:   1. Recurrent pleural effusion on right post hospital admission from 12/10/15-12/14/15 with HCAP and right sided pleural effusion.He was treated with antibiotics and had pleurx catheter drained.Recent CXR 01/07/2016 showed Patchy infiltrate in right mid and lower lung consistent with multifocal infectious process with small right pleural effusion.Clinical findings not suggestive of pneumonia. Will recheck CBC/diff, F/U CXR PA/Lat and treat if indicated prior to discharge home.Continue with Incentive spirometer. Follow up with PCP and Thoracic provider.   2. Chronic systolic CHF (congestive heart failure) (HCC) No weight gain or SOB. Continue with lopressor, digoxin, diuretic and Monitor weight daily. Follow up with PCP to monitor BMP.   3. Coronary artery disease involving native coronary artery of native heart without angina pectoris  chest pain free.Continue lopressor, Eliquis 2.5mg   Tablet twice daily.   4. Type 2 diabetes mellitus with complication, without long-term current use of insulin (HCC) CBG's stable.Continue with Novolog Flexpen Monitor CBG's three times daily. Follow up with PCP to monitor Hgb A1C.  5. BPH (benign prostatic hypertrophy) with urinary obstruction Continue on  Flomax and finasteride.   6. Anemia of chronic disease Stable. Continue to monitor CBC  7. Hypothyroidism, unspecified hypothyroidism type Continue on Levothyroxine 25 mcg daily. PCP to monitor TSH level.     Patient is being discharged with the following home health services:   PT/OT at home for ROM, exercise, Gait stability and Muscle strengthening.   [place] [facility] to assist with ADL's.     Patient is being discharged with the following durable medical equipment:    Require a 3-1 bedside commode due to unstable gait for fall precautions.   Patient has been advised to f/u with their PCP in 1-2 weeks to bring them up to date on their rehab stay.  Social services at facility was responsible for arranging this appointment.  Pt was provided with a 30 day supply of prescriptions for medications and refills must be obtained from their PCP.  For controlled substances, a more limited supply may be provided adequate until PCP appointment only.  Future labs/tests needed:  CBC/diff, CMP 01/08/2016  Portable CXR PA/LAT  01/11/2016

## 2016-01-11 ENCOUNTER — Telehealth: Payer: Self-pay | Admitting: *Deleted

## 2016-01-11 NOTE — Telephone Encounter (Signed)
-----   Message from [facility], [place] sent at 01/06/2016  8:39 AM EST ----- Please let the patient know his digoxin level is OK, no changes.  Thanks, [employer]

## 2016-01-14 ENCOUNTER — Encounter: Payer: Self-pay | Admitting: *Deleted

## 2016-01-14 ENCOUNTER — Telehealth: Payer: Self-pay | Admitting: *Deleted

## 2016-01-14 NOTE — Telephone Encounter (Signed)
-----   Message from [facility], [place] sent at 01/06/2016  8:39 AM EST ----- Please let the patient know his digoxin level is OK, no changes.  Thanks, [employer]

## 2016-01-15 ENCOUNTER — Other Ambulatory Visit: Payer: Self-pay | Admitting: Surgery

## 2016-01-15 DIAGNOSIS — J90 Pleural effusion, not elsewhere classified: Secondary | ICD-10-CM

## 2016-01-18 ENCOUNTER — Telehealth: Payer: Self-pay | Admitting: Internal Medicine

## 2016-01-18 NOTE — Telephone Encounter (Signed)
Requesting verbal orders to continue PT. 3 x a week for 3 weeks and then 2x a week for 3 weeks.  Patient just got home from Hospital.

## 2016-01-20 ENCOUNTER — Ambulatory Visit
Admission: RE | Admit: 2016-01-20 | Discharge: 2016-01-20 | Disposition: A | Discharge status: Home / Self Care | Payer: Medicare Other | Source: Ambulatory Visit | Attending: Surgery | Admitting: Surgery

## 2016-01-20 ENCOUNTER — Encounter: Payer: Self-pay | Admitting: Surgery

## 2016-01-20 ENCOUNTER — Ambulatory Visit (INDEPENDENT_AMBULATORY_CARE_PROVIDER_SITE_OTHER): Payer: Medicare Other | Admitting: Surgery

## 2016-01-20 VITALS — BP 111/74 | HR 105 | Resp 18 | Ht 74.0 in | Wt 162.2 lb

## 2016-01-20 DIAGNOSIS — J90 Pleural effusion, not elsewhere classified: Secondary | ICD-10-CM

## 2016-01-20 DIAGNOSIS — Z09 Encounter for follow-up examination after completed treatment for conditions other than malignant neoplasm: Secondary | ICD-10-CM

## 2016-01-20 DIAGNOSIS — J948 Other specified pleural conditions: Secondary | ICD-10-CM

## 2016-01-20 DIAGNOSIS — I2510 Atherosclerotic heart disease of native coronary artery without angina pectoris: Secondary | ICD-10-CM

## 2016-01-20 NOTE — Telephone Encounter (Signed)
Verbal okay given.  

## 2016-01-20 NOTE — Progress Notes (Signed)
HPI:  [name] returns today for follow up after removal of his right PleurX catheter inserted for recurrent right pleural effusion felt to be due to heart failure. Since the catheter was removed on 01/01/2016 he has been feeling a little better. He is walking with a walker and working with PT. He still has some shortness of breath with activity.  Current Outpatient Prescriptions  Medication Sig Dispense Refill  . Amino Acids-Protein Hydrolys (FEEDING SUPPLEMENT, PRO-STAT SUGAR FREE 64,) LIQD Take 30 mLs by mouth 2 (two) times daily.    Marland Kitchen amiodarone (PACERONE) 200 MG tablet Take 1 tablet (200 mg total) by mouth 2 (two) times daily. 180 tablet 2  . apixaban (ELIQUIS) 2.5 MG TABS tablet Take 1 tablet (2.5 mg total) by mouth 2 (two) times daily. 60 tablet 2  . Ascorbic Acid (VITAMIN C) 500 MG tablet Take 500 mg by mouth daily.     Marland Kitchen azelastine (ASTELIN) 0.1 % nasal spray Place 2 sprays into both nostrils 2 (two) times daily. Use in each nostril as directed 90 mL 3  . digoxin (LANOXIN) 0.125 MG tablet Take 1 tablet (0.125 mg total) by mouth every other day. 90 tablet 2  . finasteride (PROSCAR) 5 MG tablet Take 1 tablet (5 mg total) by mouth daily. 90 tablet 3  . furosemide (LASIX) 20 MG tablet Take 1 tablet (20 mg total) by mouth every other day. 45 tablet 3  . glucosamine-chondroitin 500-400 MG tablet Take 1 tablet by mouth daily.    . insulin aspart (NOVOLOG FLEXPEN) 100 UNIT/ML FlexPen Inject 3-6 Units into the skin See admin instructions. Use 6 units at breakfast, use 3 units at lunch and use 5 units at supper    . levothyroxine (SYNTHROID, LEVOTHROID) 25 MCG tablet Take 1 tablet (25 mcg total) by mouth daily before breakfast. 30 tablet 5  . lipase/protease/amylase (CREON) 12000 UNITS CPEP capsule Take 1 capsule (12,000 Units total) by mouth 3 (three) times daily before meals. 270 capsule 3  . metoprolol tartrate (LOPRESSOR) 25 MG tablet Take 1 tablet (25 mg total) by mouth 2 (two) times  daily. 180 tablet 3  . Multiple Vitamin (MULTIVITAMIN WITH MINERALS) TABS tablet Take 1 tablet by mouth daily.    . pantoprazole (PROTONIX) 40 MG tablet Take 1 tablet (40 mg total) by mouth daily. 30 tablet 0  . tamsulosin (FLOMAX) 0.4 MG CAPS capsule Take 1 capsule (0.4 mg total) by mouth 2 (two) times daily. 180 capsule 3   No current facility-administered medications for this visit.     Physical Exam: BP 111/74 mmHg  Pulse 105  Resp 18  Ht 6\' 2"  (1.88 m)  Wt 162 lb 3.2 oz (73.573 kg)  BMI 20.82 kg/m2  SpO2 97% He looks chronically ill but better than the last time I saw him on 2/1.  Lung exam is clear Cardiac exam shows a regular rate and rhythm with normal heart sounds and no murmur There is moderate bilateral lower leg and pedal edema that is unchanged.   Diagnostic Tests:  CLINICAL DATA: Shortness of breath. Ex-smoker. Followup right pleural effusion.  EXAM: CHEST 2 VIEW  COMPARISON: 12/30/2015.  FINDINGS: The cardiac silhouette remains borderline enlarged. A small caliber pleural catheter on the right is unchanged. No significant change in a small amount of right pleural fluid. There is also a small left pleural effusion. Mild thoracic spine degenerative changes. Upper abdominal surgical clips.  IMPRESSION: 1. Stable small right pleural effusion and right pleural  catheter. 2. Small left pleural effusion.   Electronically Signed  By: Claudie Revering M.D.  On: 01/20/2016 15:43   Impression:  There is minimal residual right pleural effusion after catheter removal a couple weeks ago. There is no indwelling catheter as noted in the radiology report. The patient had an old broken off catheter already in the chest but the details of that are unknown to me. I think the goal now should be ongoing management of his heart failure and physical therapy to build him up again.  Plan:  He will continue to follow up with cardiology for management of his heart  failure.   Gaye Pollack, MD [facility] [phone]

## 2016-01-21 ENCOUNTER — Encounter: Payer: Self-pay | Admitting: Internal Medicine

## 2016-01-21 ENCOUNTER — Telehealth: Payer: Self-pay | Admitting: *Deleted

## 2016-01-21 ENCOUNTER — Other Ambulatory Visit (INDEPENDENT_AMBULATORY_CARE_PROVIDER_SITE_OTHER): Payer: Medicare Other

## 2016-01-21 ENCOUNTER — Ambulatory Visit (INDEPENDENT_AMBULATORY_CARE_PROVIDER_SITE_OTHER): Payer: Medicare Other | Admitting: Internal Medicine

## 2016-01-21 VITALS — BP 96/58 | HR 58 | Temp 98.4°F | Resp 16 | Ht 74.0 in | Wt 171.0 lb

## 2016-01-21 DIAGNOSIS — E118 Type 2 diabetes mellitus with unspecified complications: Secondary | ICD-10-CM | POA: Diagnosis not present

## 2016-01-21 DIAGNOSIS — D638 Anemia in other chronic diseases classified elsewhere: Secondary | ICD-10-CM

## 2016-01-21 DIAGNOSIS — E038 Other specified hypothyroidism: Secondary | ICD-10-CM

## 2016-01-21 DIAGNOSIS — I5022 Chronic systolic (congestive) heart failure: Secondary | ICD-10-CM

## 2016-01-21 DIAGNOSIS — Z794 Long term (current) use of insulin: Secondary | ICD-10-CM

## 2016-01-21 LAB — CBC WITH DIFFERENTIAL/PLATELET
BASOS ABS: 0 10*3/uL (ref 0.0–0.1)
BASOS PCT: 0.3 % (ref 0.0–3.0)
EOS PCT: 2.9 % (ref 0.0–5.0)
Eosinophils Absolute: 0.2 10*3/uL (ref 0.0–0.7)
HEMATOCRIT: 34.6 % — AB (ref 39.0–52.0)
Hemoglobin: 11.4 g/dL — ABNORMAL LOW (ref 13.0–17.0)
LYMPHS ABS: 1.6 10*3/uL (ref 0.7–4.0)
Lymphocytes Relative: 18.7 % (ref 12.0–46.0)
MCHC: 33 g/dL (ref 30.0–36.0)
MCV: 95.5 fl (ref 78.0–100.0)
MONOS PCT: 14.1 % — AB (ref 3.0–12.0)
Monocytes Absolute: 1.2 10*3/uL — ABNORMAL HIGH (ref 0.1–1.0)
NEUTROS ABS: 5.4 10*3/uL (ref 1.4–7.7)
NEUTROS PCT: 64 % (ref 43.0–77.0)
PLATELETS: 267 10*3/uL (ref 150.0–400.0)
RBC: 3.62 Mil/uL — ABNORMAL LOW (ref 4.22–5.81)
RDW: 16.1 % — AB (ref 11.5–15.5)
WBC: 8.4 10*3/uL (ref 4.0–10.5)

## 2016-01-21 LAB — BASIC METABOLIC PANEL
BUN: 35 mg/dL — AB (ref 6–23)
CALCIUM: 8.6 mg/dL (ref 8.4–10.5)
CO2: 30 mEq/L (ref 19–32)
CREATININE: 1.22 mg/dL (ref 0.40–1.50)
Chloride: 102 mEq/L (ref 96–112)
GFR: 59.49 mL/min — AB (ref >60.00)
GLUCOSE: 31 mg/dL — AB (ref 70–99)
Potassium: 4.6 mEq/L (ref 3.5–5.1)
Sodium: 137 mEq/L (ref 135–145)

## 2016-01-21 LAB — HEMOGLOBIN A1C: Hgb A1c MFr Bld: 6.7 % — ABNORMAL HIGH (ref 4.6–6.5)

## 2016-01-21 LAB — BRAIN NATRIURETIC PEPTIDE: Pro B Natriuretic peptide (BNP): 603 pg/mL — ABNORMAL HIGH (ref 0.0–100.0)

## 2016-01-21 LAB — TSH: TSH: 6.86 u[IU]/mL — AB (ref 0.35–4.50)

## 2016-01-21 MED ORDER — TORSEMIDE 10 MG PO TABS: 10.0000 mg | ORAL_TABLET | Freq: Every day | ORAL | Status: DC

## 2016-01-21 NOTE — Patient Instructions (Signed)
Edema °Edema is an abnormal buildup of fluids in your body tissues. Edema is somewhat dependent on gravity to pull the fluid to the lowest place in your body. That makes the condition more common in the legs and thighs (lower extremities). Painless swelling of the feet and ankles is common and becomes more likely as you get older. It is also common in looser tissues, like around your eyes.  °When the affected area is squeezed, the fluid may move out of that spot and leave a dent for a few moments. This dent is called pitting.  °CAUSES  °There are many possible causes of edema. Eating too much salt and being on your feet or sitting for a long time can cause edema in your legs and ankles. Hot weather may make edema worse. Common medical causes of edema include: °· Heart failure. °· Liver disease. °· Kidney disease. °· Weak blood vessels in your legs. °· Cancer. °· An injury. °· Pregnancy. °· Some medications. °· Obesity.  °SYMPTOMS  °Edema is usually painless. Your skin may look swollen or shiny.  °DIAGNOSIS  °Your health care provider may be able to diagnose edema by asking about your medical history and doing a physical exam. You may need to have tests such as X-rays, an electrocardiogram, or blood tests to check for medical conditions that may cause edema.  °TREATMENT  °Edema treatment depends on the cause. If you have heart, liver, or kidney disease, you need the treatment appropriate for these conditions. General treatment may include: °· Elevation of the affected body part above the level of your heart. °· Compression of the affected body part. Pressure from elastic bandages or support stockings squeezes the tissues and forces fluid back into the blood vessels. This keeps fluid from entering the tissues. °· Restriction of fluid and salt intake. °· Use of a water pill (diuretic). These medications are appropriate only for some types of edema. They pull fluid out of your body and make you urinate more often. This  gets rid of fluid and reduces swelling, but diuretics can have side effects. Only use diuretics as directed by your health care provider. °HOME CARE INSTRUCTIONS  °· Keep the affected body part above the level of your heart when you are lying down.   °· Do not sit still or stand for prolonged periods.   °· Do not put anything directly under your knees when lying down. °· Do not wear constricting clothing or garters on your upper legs.   °· Exercise your legs to work the fluid back into your blood vessels. This may help the swelling go down.   °· Wear elastic bandages or support stockings to reduce ankle swelling as directed by your health care provider.   °· Eat a low-salt diet to reduce fluid if your health care provider recommends it.   °· Only take medicines as directed by your health care provider.  °SEEK MEDICAL CARE IF:  °· Your edema is not responding to treatment. °· You have heart, liver, or kidney disease and notice symptoms of edema. °· You have edema in your legs that does not improve after elevating them.   °· You have sudden and unexplained weight gain. °SEEK IMMEDIATE MEDICAL CARE IF:  °· You develop shortness of breath or chest pain.   °· You cannot breathe when you lie down. °· You develop pain, redness, or warmth in the swollen areas.   °· You have heart, liver, or kidney disease and suddenly get edema. °· You have a fever and your symptoms suddenly get worse. °MAKE SURE YOU:  °·   Understand these instructions. °· Will watch your condition. °· Will get help right away if you are not doing well or get worse. °  °This information is not intended to replace advice given to you by your health care provider. Make sure you discuss any questions you have with your health care provider. °  °Document Released: 11/14/2005 Document Revised: 12/05/2014 Document Reviewed: 09/06/2013 °[employer]vier Interactive Patient Education ©2016 [employer] ° °

## 2016-01-21 NOTE — Progress Notes (Signed)
Subjective:  Patient ID: [name], male    DOB: Jan 05, 1925  Age: 82 y.o. MRN: [id number]  CC: Hypothyroidism and Congestive Heart Failure   HPI [name] presents for a follow-up on hypothyroidism and heart failure. He saw his chest surgeon yesterday and hes had the Pleur-evac removed from his chest and tells me that the fluid around his lungs has resolved. Today he complains of worsening edema over both ankles. He takes 1 dose of Lasix every other day. He denies chest pain, coughing, shortness of breath, dizziness, or syncope. He has gained about 10 pounds over the last 8 days. He brings a blood sugar log with him as well that shows that his blood sugars have increased slightly but not above 150. He also brings a note from the physical therapist at his personal care home and it shows that with exertion his pulse ox is dipping down to 86%. He tells me that he does not want to use oxygen therapy.  Outpatient Prescriptions Prior to Visit  Medication Sig Dispense Refill  . Amino Acids-Protein Hydrolys (FEEDING SUPPLEMENT, PRO-STAT SUGAR FREE 64,) LIQD Take 30 mLs by mouth 2 (two) times daily.    Marland Kitchen amiodarone (PACERONE) 200 MG tablet Take 1 tablet (200 mg total) by mouth 2 (two) times daily. 180 tablet 2  . apixaban (ELIQUIS) 2.5 MG TABS tablet Take 1 tablet (2.5 mg total) by mouth 2 (two) times daily. 60 tablet 2  . Ascorbic Acid (VITAMIN C) 500 MG tablet Take 500 mg by mouth daily.     Marland Kitchen azelastine (ASTELIN) 0.1 % nasal spray Place 2 sprays into both nostrils 2 (two) times daily. Use in each nostril as directed 90 mL 3  . digoxin (LANOXIN) 0.125 MG tablet Take 1 tablet (0.125 mg total) by mouth every other day. 90 tablet 2  . finasteride (PROSCAR) 5 MG tablet Take 1 tablet (5 mg total) by mouth daily. 90 tablet 3  . glucosamine-chondroitin 500-400 MG tablet Take 1 tablet by mouth daily.    . insulin aspart (NOVOLOG FLEXPEN) 100 UNIT/ML FlexPen Inject 3-6 Units into the skin See admin  instructions. Use 6 units at breakfast, use 3 units at lunch and use 5 units at supper    . levothyroxine (SYNTHROID, LEVOTHROID) 25 MCG tablet Take 1 tablet (25 mcg total) by mouth daily before breakfast. 30 tablet 5  . lipase/protease/amylase (CREON) 12000 UNITS CPEP capsule Take 1 capsule (12,000 Units total) by mouth 3 (three) times daily before meals. 270 capsule 3  . metoprolol tartrate (LOPRESSOR) 25 MG tablet Take 1 tablet (25 mg total) by mouth 2 (two) times daily. 180 tablet 3  . Multiple Vitamin (MULTIVITAMIN WITH MINERALS) TABS tablet Take 1 tablet by mouth daily.    . tamsulosin (FLOMAX) 0.4 MG CAPS capsule Take 1 capsule (0.4 mg total) by mouth 2 (two) times daily. 180 capsule 3  . furosemide (LASIX) 20 MG tablet Take 1 tablet (20 mg total) by mouth every other day. 45 tablet 3  . pantoprazole (PROTONIX) 40 MG tablet Take 1 tablet (40 mg total) by mouth daily. (Patient not taking: Reported on 01/21/2016) 30 tablet 0   No facility-administered medications prior to visit.    ROS Review of Systems  Constitutional: Positive for fatigue. Negative for chills, diaphoresis, appetite change and unexpected weight change.  HENT: Negative.   Eyes: Negative.   Respiratory: Negative.  Negative for cough, choking, chest tightness, shortness of breath and stridor.   Cardiovascular: Positive for  leg swelling. Negative for chest pain and palpitations.  Gastrointestinal: Negative.  Negative for nausea, vomiting, abdominal pain, diarrhea and constipation.  Endocrine: Negative.   Genitourinary: Negative.  Negative for difficulty urinating.  Musculoskeletal: Negative.  Negative for joint swelling, gait problem, neck pain and neck stiffness.  Skin: Negative.  Negative for color change, pallor and rash.  Allergic/Immunologic: Negative.   Neurological: Negative.  Negative for dizziness, tremors, weakness, light-headedness and numbness.  Hematological: Negative.  Negative for adenopathy. Does not  bruise/bleed easily.  Psychiatric/Behavioral: Negative.     Objective:  BP 96/58 mmHg  Pulse 58  Temp(Src) 98.4 F (36.9 C) (Oral)  Resp 16  Ht 6\' 2"  (1.88 m)  Wt 171 lb (77.565 kg)  BMI 21.95 kg/m2  SpO2 96%  BP Readings from Last 3 Encounters:  01/21/16 96/58  01/20/16 111/74  01/07/16 100/60    Wt Readings from Last 3 Encounters:  01/21/16 171 lb (77.565 kg)  01/20/16 162 lb 3.2 oz (73.573 kg)  01/07/16 160 lb 1.6 oz (72.621 kg)    Physical Exam  Constitutional: He is oriented to person, place, and time. He appears well-developed and well-nourished. No distress.  HENT:  Mouth/Throat: Oropharynx is clear and moist. No oropharyngeal exudate.  Eyes: Conjunctivae are normal. Right eye exhibits no discharge. Left eye exhibits no discharge. No scleral icterus.  Neck: Normal range of motion. Neck supple. No JVD present. No tracheal deviation present. No thyromegaly present.  Cardiovascular: Normal rate, regular rhythm, normal heart sounds and intact distal pulses.  Exam reveals no gallop and no friction rub.   No murmur heard. Pulmonary/Chest: Effort normal and breath sounds normal. No stridor. No respiratory distress. He has no wheezes. He has no rales. He exhibits no tenderness.  Abdominal: Soft. Bowel sounds are normal. He exhibits no distension and no mass. There is no tenderness. There is no rebound and no guarding.  Musculoskeletal: Normal range of motion. He exhibits edema (2+ pitting edema from the mid-shin to his ankles). He exhibits no tenderness.  Lymphadenopathy:    He has no cervical adenopathy.  Neurological: He is oriented to person, place, and time.  Skin: Skin is warm and dry. No rash noted. He is not diaphoretic. No erythema. No pallor.  Vitals reviewed.   Lab Results  Component Value Date   WBC 8.4 01/21/2016   HGB 11.4* 01/21/2016   HCT 34.6* 01/21/2016   PLT 267.0 01/21/2016   GLUCOSE 31* 01/21/2016   CHOL 114 10/21/2015   TRIG 59.0 10/21/2015    HDL 46.20 10/21/2015   LDLCALC 56 10/21/2015   ALT 21 12/13/2015   AST 35 12/13/2015   NA 137 01/21/2016   K 4.6 01/21/2016   CL 102 01/21/2016   CREATININE 1.22 01/21/2016   BUN 35* 01/21/2016   CO2 30 01/21/2016   TSH 6.86* 01/21/2016   PSA 2.28 01/04/2010   INR 1.44 12/25/2015   HGBA1C 6.7* 01/21/2016   MICROALBUR <0.7 02/25/2015    Dg Chest 2 View  01/20/2016  CLINICAL DATA:  Shortness of breath. Ex-smoker. Followup right pleural effusion. EXAM: CHEST  2 VIEW COMPARISON:  12/30/2015. FINDINGS: The cardiac silhouette remains borderline enlarged. A small caliber pleural catheter on the right is unchanged. No significant change in a small amount of right pleural fluid. There is also a small left pleural effusion. Mild thoracic spine degenerative changes. Upper abdominal surgical clips. IMPRESSION: 1. Stable small right pleural effusion and right pleural catheter. 2. Small left pleural effusion. Electronically Signed  By: Claudie Revering M.D.   On: 01/20/2016 15:43    Assessment & Plan:   [name] was seen today for hypothyroidism and congestive heart failure.  Diagnoses and all orders for this visit:  Chronic systolic CHF (congestive heart failure) ([place])- He has gained weight and has pitting edema, his BNP is slightly higher than before, I think it would be best for him to take a daily diuretic that would last for 24 hours and have asked him to change furosemide to torsemide. -     torsemide (DEMADEX) 10 MG tablet; Take 1 tablet (10 mg total) by mouth daily. -     Brain natriuretic peptide; Future -     Basic metabolic panel; Future  Other specified hypothyroidism- his TSH is headed in the right direction, we'll continue the current dose for now. -     TSH; Future  Type 2 diabetes mellitus with complication, with long-term current use of insulin ([place])- he had been experiencing some episodes of hypoglycemia so while he was admitted it was advised that he back off on his insulin dose,  his A1c is up slightly but this does not need to be addressed at this time. He will continue with his recent insulin doses. -     Hemoglobin A1c; Future -     Basic metabolic panel; Future  Anemia of chronic disease- improvement noted with this. -     CBC with Differential/Platelet; Future   I have discontinued Mr. Stilts furosemide and pantoprazole. I am also having him start on torsemide. Additionally, I am having him maintain his vitamin C, lipase/protease/amylase, tamsulosin, finasteride, azelastine, apixaban, digoxin, amiodarone, levothyroxine, metoprolol tartrate, insulin aspart, feeding supplement (PRO-STAT SUGAR FREE 64), glucosamine-chondroitin, and multivitamin with minerals.  Meds ordered this encounter  Medications  . torsemide (DEMADEX) 10 MG tablet    Sig: Take 1 tablet (10 mg total) by mouth daily.    Dispense:  30 tablet    Refill:  3     Follow-up: Return in about 4 months (around 05/20/2016).  [name], MD

## 2016-01-21 NOTE — Progress Notes (Signed)
Pre visit review using our clinic review tool, if applicable. No additional management support is needed unless otherwise documented below in the visit note. 

## 2016-01-21 NOTE — Telephone Encounter (Signed)
Critical Glucose @ 31...[name]

## 2016-01-22 NOTE — Telephone Encounter (Signed)
PCP responded to pt via my chart.

## 2016-01-25 ENCOUNTER — Other Ambulatory Visit: Payer: Self-pay | Admitting: Internal Medicine

## 2016-01-25 DIAGNOSIS — J90 Pleural effusion, not elsewhere classified: Secondary | ICD-10-CM | POA: Diagnosis not present

## 2016-01-26 ENCOUNTER — Telehealth: Payer: Self-pay

## 2016-01-26 NOTE — Telephone Encounter (Signed)
Home Health Cert/Plan of Care received (11/17/2015 - 03/15/2016) and placed on MD's desk for signature

## 2016-01-27 ENCOUNTER — Ambulatory Visit: Payer: Medicare Other | Admitting: Internal Medicine

## 2016-01-27 ENCOUNTER — Other Ambulatory Visit: Payer: Self-pay

## 2016-01-27 DIAGNOSIS — I5022 Chronic systolic (congestive) heart failure: Secondary | ICD-10-CM

## 2016-01-27 MED ORDER — TORSEMIDE 10 MG PO TABS: 10.0000 mg | ORAL_TABLET | Freq: Every day | ORAL | Status: DC

## 2016-01-27 NOTE — Telephone Encounter (Signed)
Paperwork signed, faxed, copy sent to scan 

## 2016-02-03 ENCOUNTER — Encounter: Payer: Self-pay | Admitting: Surgery

## 2016-02-14 NOTE — Progress Notes (Signed)
Cardiology Office Note Date:  02/15/2016  Patient ID:  [name], [name] 1925/06/12, MRN [id number] PCP:  [name], MD   Electrophysiologist: Dr. Caryl Comes    Chief Complaint:  Post hospital evaluation  History of Present Illness: [name] is a 82 y.o. 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   Discharged from [facility] 11/12/15 with acute CHF, CAP, difficult rate to control with his AF and found with new CM with a large L pleural effusion requiring 2 thoras, d/c summary states, CT chest shows a moderate-sized right pleural effusion, questionable right lower lobe pneumonia, completed treatment with levofloxacin 8 days, Chronic fragment from a catheter measuring 12.5 cm which was present on prior CT, patient states he has had it for 20 years.  He was diuresed, rate was controlled and eventually discharged. His coreg was changed to metoprolol, unable to tolerate ARB with soft BP and has allergy to lisinopril (cough), he had LHC without obstructive disease, his AF rate was difficult to control, he was put on metoprolol, then dig and eventually back on amiodarone.  He was seen by myself in discussion with Dr. Caryl Comes 11/26/16 feeling tired but in relation to how he had been feeling, felt like he was improving.  He had labs done, his amiodarone and his dig was decreased given his amiodarone.  He was started on Synthroid therapy with elevated TSH, and temporarily his lasix increased with edema.  He did a CXR via his PMD afterwards and was fund to have reaccumulation of his R pleural effusion and had a pleurX catheter placed 12/04/15, he was discharged 12/06/15 only to readmit 12/10/15 with worsening SOB despite the catheter and [facility] RN  draining it noting that time with an infiltrate and reaccumulation of fluid treated with antibiotics and discharged again with the pleurX catheter and HHC.  He was followed with CTS, Dr. Cyndia Bent and with minimal ongoing drainage was removed 01/01/16.  He had been at [facility] for rehab, at one point not ambulating at all with marked functional decline.  He was seen in f/u by Dr. Cyndia Bent 01/20/16 with minimal residual R pleural effusion and to c/w cardiology services. He saw his PCP 01/21/16 with c/o increasing edema and reports of O2sats decreasing to 86% with ambulation on QOD lasix, Vitals at that visit report his HR at 58bpm, his lasix stopped and started on torsemide 10mg  daily.  He has noted improvement in his breathing, no rest SOB, he is sleeping well with one pillow, no symptoms of orthopnea or PND.  He feels hisLE edema is abut the same, maybe slightly improved, always better in the mornings worse as the day goes.  No CP, palpitations, no dizziness, near syncope or syncope.  He is ambulating now with only a cane, and reports continued slow improvement in his exertional capacity and strength.  His weight is down only 1 lb by our scale He is doing well with Eliquis, no bleeding or signs of bleeding   Past Medical History  Diagnosis Date  . Acid reflux disease     history of  . H/O: GI bleed   . Persistent atrial fibrillation ([place])     a. Failed tikosyn. b. DCCV did not hold 07/2015.  Marland Kitchen  Benign prostatic hypertrophy     history of  . Hepatic damage march 2009    retained hepatic stone , intrahepatic duct catheter  . CAD (coronary artery disease)     a. R/LHC 11/09/15: mild nonobstructive CAD ([facility]IC[facility]) with well- compensated hemodynamics with low filling pressures.  . Hypertension   . 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 Seizures ([place])   .  Ventricular tachycardia ? Tikosyn proarrhtyhmia   . Cataracts, bilateral   . Diabetes mellitus     insulin dep  . Pleural effusion 10/2015  . Acute on chronic systolic CHF (congestive heart failure) ([place])   . CKD (chronic kidney disease), stage II   . HCAP (healthcare-associated pneumonia) 12/10/2015  . Hypothyroid   . Pancreatic insufficiency ([place])   . Systolic CHF [facility])     Past Surgical History  Procedure Laterality Date  . Cataract extraction w/ intraocular lens  implant, bilateral Bilateral ~ 2014  . Cholecystectomy    . Partial gastrectomy    . Orif shoulder fracture Left 1941    2 pens inserted  . Whipple procedure      operation  . Tumor excision Right     sarcoma removal from tricep  . Inguinal hernia repair Right X 2  . Appendectomy    . Cardioversion  03/07/2012    Procedure: CARDIOVERSION;  Surgeon: Deboraha Sprang, MD;  Location: [place];  Service: Cardiovascular;  Laterality: N/A;  . Cardioversion N/A 08/10/2015    Procedure: CARDIOVERSION;  Surgeon: Thayer Headings, MD;  Location[facility] ENDOSCOPY;  Service: Cardiovascular;  Laterality: N/A;  . Cardiac catheterization N/A 11/09/2015    Procedure: Right/Left Heart Cath and Coronary Angiography;  Surgeon: Jolaine Artist, MD;  Location: [place] CV LAB;  Service: Cardiovascular;  Laterality: N/A;  . Chest tube insertion Right 12/04/2015    Procedure: INSERTION RIGHT PLEURAL DRAINAGE CATHETER;  Surgeon: Gaye Pollack, MD;  Location: [place] OR;  Service: Thoracic;  Laterality: Right;  . Fracture surgery    . Thoracentesis    . Removal of pleural drainage catheter Right 01/01/2016    Procedure: REMOVAL OF PLEURAL DRAINAGE CATHETER;  Surgeon: Gaye Pollack, MD;  Location: [facility] OR;  Service: Thoracic;  Laterality: Right;    Current Outpatient Prescriptions  Medication Sig Dispense Refill  . Amino Acids-Protein Hydrolys (FEEDING SUPPLEMENT, PRO-STAT SUGAR FREE 64,) LIQD Take 30 mLs by mouth 2 (two) times daily.    Marland Kitchen amiodarone  (PACERONE) 200 MG tablet Take 1 tablet (200 mg total) by mouth 2 (two) times daily. 180 tablet 2  . apixaban (ELIQUIS) 2.5 MG TABS tablet Take 1 tablet (2.5 mg total) by mouth 2 (two) times daily. 180 tablet 2  . Ascorbic Acid (VITAMIN C) 500 MG tablet Take 500 mg by mouth daily.     Marland Kitchen azelastine (ASTELIN) 0.1 % nasal spray Place 2 sprays into both nostrils 2 (two) times daily. Use in each nostril as directed 90 mL 3  . CREON 12000 units CPEP capsule Take 1 capsule by mouth 3  times a day before meals 300 capsule 0  . digoxin (LANOXIN) 0.125 MG tablet Take 1 tablet (0.125 mg total) by mouth every other day. 90 tablet 2  . finasteride (PROSCAR) 5 MG tablet Take 1 tablet (5 mg total) by mouth daily. 90 tablet  3  . glucosamine-chondroitin 500-400 MG tablet Take 1 tablet by mouth daily.    . insulin aspart (NOVOLOG FLEXPEN) 100 UNIT/ML FlexPen Inject 3-6 Units into the skin See admin instructions. Use 6 units at breakfast, use 3 units at lunch and use 5 units at supper    . levothyroxine (SYNTHROID, LEVOTHROID) 25 MCG tablet Take 1 tablet (25 mcg total) by mouth daily before breakfast. 30 tablet 5  . metoprolol tartrate (LOPRESSOR) 25 MG tablet Take 1 tablet (25 mg total) by mouth 2 (two) times daily. 180 tablet 3  . Multiple Vitamin (MULTIVITAMIN WITH MINERALS) TABS tablet Take 1 tablet by mouth daily.    . tamsulosin (FLOMAX) 0.4 MG CAPS capsule Take 1 capsule (0.4 mg total) by mouth 2 (two) times daily. 180 capsule 3  . torsemide (DEMADEX) 10 MG tablet Take 1 tablet (10 mg total) by mouth daily. 30 tablet 3   No current facility-administered medications for this visit.    Allergies:   Lisinopril; [place] solostar; Clindamycin; Other; Oxycodone-acetaminophen; and Penicillins   Social History:  The patient  reports that he quit smoking about 24 years ago. His smoking use included Pipe. He has never used smokeless tobacco. He reports that he does not drink alcohol or use illicit drugs.   Family  History:  The patient's family history includes Arthritis in his mother and sister; Cancer in his brother; Coronary artery disease in his brother and sister; Coronary artery disease (age of onset: 19) in his father; Diabetes in his sister; Fibromyalgia in his sister; Heart disease in his brother and father; Osteoarthritis (age of onset: 33) in his mother; Peripheral vascular disease (age of onset: 58) in his sister.  ROS:  Please see the history of present illness.  All other systems are reviewed and otherwise negative.   PHYSICAL EXAM:  VS:  BP 110/64 mmHg  Pulse 91  Ht 6\' 2"  (1.88 m)  Wt 170 lb (77.111 kg)  BMI 21.82 kg/m2 BMI: Body mass index is 21.82 kg/(m^2).  Thin, elderly WM, in no acute distress, frail in appearance, observed to ambulate independently without the cane without difficulty today, thin body habitus HEENT: normocephalic, atraumatic Neck: no JVD, carotid bruits or masses Cardiac:  irreg-irreg rate/rhythm, 2/6SM, no rubs, or gallops Lungs:  CTA b/l  no crackles, no wheezing, rhonchi or rales Abd: soft, nontender MS: no deformity, age appropriate atrophy Ext: 1-2+ edema to mid-shin b/l Skin: warm and dry, no rash Neuro:  No gross deficits appreciated Psych: euthymic mood, full affect  EKG:  Done 11/27/15 shows AFib 57bpm  11/09/15 LHC 1. Mild non-obstructive CAD 2. Nonischemic CM with EF 25% by echo 3. Chronic AF 4. Well-compensated hemodynamics with low filling pressures  11/05/15: Echocardiogram Study Conclusions - Left ventricle: The cavity size was normal. Systolic function was severely reduced. The estimated ejection fraction was in the range of 25% to 30%. Diffuse hypokinesis. - Aortic valve: Trileaflet; mildly thickened, mildly calcified leaflets. Cusp separation was reduced. - Aorta: The aorta was moderately calcified. - Mitral valve: Moderately calcified annulus. There was mild regurgitation. - Left atrium: The atrium was moderately  dilated. - Right ventricle: The cavity size was moderately dilated. Wall thickness was normal. Systolic function was moderately reduced. - Right atrium: The atrium was severely dilated. - Tricuspid valve: There was moderate regurgitation. - Pulmonic valve: There was moderate regurgitation. Impressions: - EF is reduced when compared to prior study (50%).   Recent Labs: 01/05/16: Dig 0.6 12/11/2015: Magnesium 1.7 12/13/2015: ALT 21  12/25/2015: B Natriuretic Peptide 273.9* 01/21/2016: BUN 35*; Creatinine, Ser 1.22; Hemoglobin 11.4*; Platelets 267.0; Potassium 4.6; Pro B Natriuretic peptide (BNP) 603.0*; Sodium 137; TSH 6.86*  10/21/2015: Cholesterol 114; HDL 46.20; LDL Cholesterol 56; Total CHOL/HDL Ratio 2; Triglycerides 59.0; VLDL 11.8  11/04/15: TSH 6.858 11/05/15 free T4 1.00 (wnl)  CrCl cannot be calculated (Patient has no serum creatinine result on file.).   Wt Readings from Last 3 Encounters:  02/15/16 170 lb (77.111 kg)  01/21/16 171 lb (77.565 kg)  01/20/16 162 lb 3.2 oz (73.573 kg)     Other studies reviewed: Additional studies/records reviewed today include: summarized above  ASSESSMENT AND PLAN:  1. persistent AFib October his amio was stopped with elevated TSH, at his hospital stay in December it was decided to resume given difficulty with rate management, noted abn TSH and normal free T4, plan was to monitor this out patient and continue amiodarone, out patient he was started on Synthroid in Dec. 2016, PMD note, Feb reported stable thyroid and no changes to his synthroid made CHADS2Vasc is at least 5 on Eliquis 2.5mg  BID Dr. Caryl Comes has mentioned if HR control becomes an issue/felt to be exacerbating his CHF, may ned to visit CRT-P and AV node ablation HR at his last PCP visit was controlled at 58, 91 bpm today  2. Chronic CHF (systolic) New NICM dec 2016 BP unable to tolerate ARB, and intolerant/allergic to ACE Recurrent hospitalizations with fluid OL, large R pleural  effusion has required a number of thoracentesis', and most recently a PleurX cath he had in placed 12/04/15 and removed 01/01/16 on torsemide now with reports of stable weight at home 160-162   3. HTN  soft BP described for this patient in his records   Disposition: No changes at this time, we will check a BMET on the torsemide, his edema appears stable, encouraged to elevate his feet/legs when sitting, symptomatically he continues to improve.  We will see him back in 3 months, sooner if needed.    Current medicines are reviewed at length with the patient today.  The patient did not have any concerns regarding medicines.  Haywood Lasso, PA-C 02/15/2016 2:50 PM     [address] [place] [address] [phone] (office)  [phone] (fax)

## 2016-02-15 ENCOUNTER — Ambulatory Visit (INDEPENDENT_AMBULATORY_CARE_PROVIDER_SITE_OTHER): Payer: Medicare Other | Admitting: Physician Assistant

## 2016-02-15 ENCOUNTER — Encounter: Payer: Self-pay | Admitting: Physician Assistant

## 2016-02-15 VITALS — BP 110/64 | HR 91 | Ht 74.0 in | Wt 170.0 lb

## 2016-02-15 DIAGNOSIS — I5022 Chronic systolic (congestive) heart failure: Secondary | ICD-10-CM

## 2016-02-15 DIAGNOSIS — I2510 Atherosclerotic heart disease of native coronary artery without angina pectoris: Secondary | ICD-10-CM | POA: Diagnosis not present

## 2016-02-15 DIAGNOSIS — I4891 Unspecified atrial fibrillation: Secondary | ICD-10-CM | POA: Diagnosis not present

## 2016-02-15 LAB — BASIC METABOLIC PANEL
BUN: 31 mg/dL — AB (ref 7–25)
CALCIUM: 8.3 mg/dL — AB (ref 8.6–10.3)
CHLORIDE: 94 mmol/L — AB (ref 98–110)
CO2: 27 mmol/L (ref 20–31)
CREATININE: 1.38 mg/dL — AB (ref 0.70–1.11)
Glucose, Bld: 137 mg/dL — ABNORMAL HIGH (ref 65–99)
Potassium: 4 mmol/L (ref 3.5–5.3)
Sodium: 132 mmol/L — ABNORMAL LOW (ref 135–146)

## 2016-02-15 MED ORDER — APIXABAN 2.5 MG PO TABS: 2.5000 mg | ORAL_TABLET | Freq: Two times a day (BID) | ORAL | Status: AC

## 2016-02-15 NOTE — Patient Instructions (Addendum)
Medication Instructions:   Your physician recommends that you continue on your current medications as directed. Please refer to the Current Medication list given to you today.    If you need a refill on your cardiac medications before your next appointment, please call your pharmacy.  Labwork: BMET TODAY     Testing/Procedures: NONE ORDER TODAY    Follow-Up:   IN 3 MONTHS WITH DR Caryl Comes    Any Other Special Instructions Will Be Listed Below (If Applicable).

## 2016-02-17 ENCOUNTER — Telehealth: Payer: Self-pay | Admitting: *Deleted

## 2016-02-17 NOTE — Telephone Encounter (Signed)
-----   Message from [facility] [place] sent at 02/16/2016  5:16 PM EDT ----- Please let the patient know his labs look OK, no changes to his medicines at thistime.  Reminde them to monitor his weight daily and notify us of increasing weights or swelling or increased SOB.  Thanks [employer]

## 2016-02-24 ENCOUNTER — Other Ambulatory Visit: Payer: Self-pay

## 2016-02-24 NOTE — Patient Outreach (Signed)
[name]) Care Management  02/24/2016  [name] September 08, 1925 [id number]   Telephone Screen  Referral Date: 02/24/16 Referral Source: HF readmission report Referral Reason: 3 admissions & 1 ED visit within past 6 months   Outreach attempt #1 to patient. Spoke with patient. Patient having very bad phone connection and could not hear RN CM at all.     Plan: RN CM will make outreach attempt to patient at later time.   [name], RN,BSN,CCM [place] [facility] Telephonic Care Management Coordinator Direct Phone: [phone] Toll Free: [phone] Fax: [phone]

## 2016-02-26 ENCOUNTER — Other Ambulatory Visit: Payer: Self-pay

## 2016-02-26 NOTE — Patient Outreach (Signed)
[name]) Care Management  02/26/2016  [name] 11/12/1925 [id number]   Telephone Screen  Referral Date: 02/24/16 Referral Source: HF readmission report Referral Reason: 3 admissions & 1 ED visit within past 6 months   Outreach attempt #2 to patient. Line busy after several attempts.   Plan: RN CM will make outreach attempt to patient within three business days.  [name], RN,BSN,CCM [place] [facility] Telephonic Care Management Coordinator Direct Phone: [phone] Toll Free: [phone] Fax: [phone]

## 2016-02-29 ENCOUNTER — Other Ambulatory Visit: Payer: Self-pay

## 2016-02-29 ENCOUNTER — Ambulatory Visit: Payer: Self-pay

## 2016-02-29 NOTE — Patient Outreach (Signed)
[facility] [facility]) Care Management  02/29/2016  GUNTER GOODNER 1925-10-13 [id number]   Telephone Screen  Referral Date: 02/24/16 Referral Source: HF readmission report Referral Reason: 3 admissions & 1 ED visit within past 6 months    Outreach attempt # 3 to patient. No answer.   Plan: RN CM will send unsuccessful outreach letter to patient and close case if no response from patient within 10 business days.   [name], RN,BSN,CCM [place] [facility] Telephonic Care Management Coordinator Direct Phone: [phone] Toll Free: [phone] Fax: [phone]

## 2016-03-07 ENCOUNTER — Ambulatory Visit (INDEPENDENT_AMBULATORY_CARE_PROVIDER_SITE_OTHER): Payer: Medicare Other | Admitting: Internal Medicine

## 2016-03-07 ENCOUNTER — Other Ambulatory Visit (INDEPENDENT_AMBULATORY_CARE_PROVIDER_SITE_OTHER): Payer: Medicare Other

## 2016-03-07 ENCOUNTER — Encounter: Payer: Self-pay | Admitting: Internal Medicine

## 2016-03-07 ENCOUNTER — Ambulatory Visit (INDEPENDENT_AMBULATORY_CARE_PROVIDER_SITE_OTHER)
Admission: RE | Admit: 2016-03-07 | Discharge: 2016-03-07 | Disposition: A | Discharge status: Home / Self Care | Payer: Medicare Other | Source: Ambulatory Visit | Attending: Internal Medicine | Admitting: Internal Medicine

## 2016-03-07 VITALS — BP 100/60 | HR 82 | Temp 97.6°F | Resp 16 | Ht 74.0 in | Wt 170.0 lb

## 2016-03-07 DIAGNOSIS — I2510 Atherosclerotic heart disease of native coronary artery without angina pectoris: Secondary | ICD-10-CM | POA: Diagnosis not present

## 2016-03-07 DIAGNOSIS — K8689 Other specified diseases of pancreas: Secondary | ICD-10-CM | POA: Diagnosis not present

## 2016-03-07 DIAGNOSIS — J90 Pleural effusion, not elsewhere classified: Secondary | ICD-10-CM

## 2016-03-07 DIAGNOSIS — E038 Other specified hypothyroidism: Secondary | ICD-10-CM | POA: Diagnosis not present

## 2016-03-07 DIAGNOSIS — I5022 Chronic systolic (congestive) heart failure: Secondary | ICD-10-CM | POA: Diagnosis not present

## 2016-03-07 DIAGNOSIS — R600 Localized edema: Secondary | ICD-10-CM | POA: Insufficient documentation

## 2016-03-07 LAB — BASIC METABOLIC PANEL
BUN: 47 mg/dL — ABNORMAL HIGH (ref 6–23)
CALCIUM: 8.6 mg/dL (ref 8.4–10.5)
CO2: 29 mEq/L (ref 19–32)
Chloride: 99 mEq/L (ref 96–112)
Creatinine, Ser: 1.89 mg/dL — ABNORMAL HIGH (ref 0.40–1.50)
GFR: 35.89 mL/min — AB (ref >60.00)
GLUCOSE: 60 mg/dL — AB (ref 70–99)
Potassium: 4.2 mEq/L (ref 3.5–5.1)
Sodium: 134 mEq/L — ABNORMAL LOW (ref 135–145)

## 2016-03-07 LAB — BRAIN NATRIURETIC PEPTIDE: Pro B Natriuretic peptide (BNP): 792 pg/mL — ABNORMAL HIGH (ref 0.0–100.0)

## 2016-03-07 LAB — TSH: TSH: 9.9 u[IU]/mL — AB (ref 0.35–4.50)

## 2016-03-07 MED ORDER — METOLAZONE 5 MG PO TABS: 5.0000 mg | ORAL_TABLET | Freq: Every day | ORAL | Status: DC

## 2016-03-07 MED ORDER — TORSEMIDE 20 MG PO TABS: 20.0000 mg | ORAL_TABLET | Freq: Every day | ORAL | Status: DC

## 2016-03-07 MED ORDER — PANCRELIPASE (LIP-PROT-AMYL) 12000-38000 UNITS PO CPEP: ORAL_CAPSULE | ORAL | Status: AC

## 2016-03-07 MED ORDER — LEVOTHYROXINE SODIUM 50 MCG PO TABS
50.0000 ug | ORAL_TABLET | Freq: Every day | ORAL | Status: DC
Start: 2016-03-07 — End: 2016-05-11

## 2016-03-07 NOTE — Patient Instructions (Signed)
Edema °Edema is an abnormal buildup of fluids in your body tissues. Edema is somewhat dependent on gravity to pull the fluid to the lowest place in your body. That makes the condition more common in the legs and thighs (lower extremities). Painless swelling of the feet and ankles is common and becomes more likely as you get older. It is also common in looser tissues, like around your eyes.  °When the affected area is squeezed, the fluid may move out of that spot and leave a dent for a few moments. This dent is called pitting.  °CAUSES  °There are many possible causes of edema. Eating too much salt and being on your feet or sitting for a long time can cause edema in your legs and ankles. Hot weather may make edema worse. Common medical causes of edema include: °· Heart failure. °· Liver disease. °· Kidney disease. °· Weak blood vessels in your legs. °· Cancer. °· An injury. °· Pregnancy. °· Some medications. °· Obesity.  °SYMPTOMS  °Edema is usually painless. Your skin may look swollen or shiny.  °DIAGNOSIS  °Your health care provider may be able to diagnose edema by asking about your medical history and doing a physical exam. You may need to have tests such as X-rays, an electrocardiogram, or blood tests to check for medical conditions that may cause edema.  °TREATMENT  °Edema treatment depends on the cause. If you have heart, liver, or kidney disease, you need the treatment appropriate for these conditions. General treatment may include: °· Elevation of the affected body part above the level of your heart. °· Compression of the affected body part. Pressure from elastic bandages or support stockings squeezes the tissues and forces fluid back into the blood vessels. This keeps fluid from entering the tissues. °· Restriction of fluid and salt intake. °· Use of a water pill (diuretic). These medications are appropriate only for some types of edema. They pull fluid out of your body and make you urinate more often. This  gets rid of fluid and reduces swelling, but diuretics can have side effects. Only use diuretics as directed by your health care provider. °HOME CARE INSTRUCTIONS  °· Keep the affected body part above the level of your heart when you are lying down.   °· Do not sit still or stand for prolonged periods.   °· Do not put anything directly under your knees when lying down. °· Do not wear constricting clothing or garters on your upper legs.   °· Exercise your legs to work the fluid back into your blood vessels. This may help the swelling go down.   °· Wear elastic bandages or support stockings to reduce ankle swelling as directed by your health care provider.   °· Eat a low-salt diet to reduce fluid if your health care provider recommends it.   °· Only take medicines as directed by your health care provider.  °SEEK MEDICAL CARE IF:  °· Your edema is not responding to treatment. °· You have heart, liver, or kidney disease and notice symptoms of edema. °· You have edema in your legs that does not improve after elevating them.   °· You have sudden and unexplained weight gain. °SEEK IMMEDIATE MEDICAL CARE IF:  °· You develop shortness of breath or chest pain.   °· You cannot breathe when you lie down. °· You develop pain, redness, or warmth in the swollen areas.   °· You have heart, liver, or kidney disease and suddenly get edema. °· You have a fever and your symptoms suddenly get worse. °MAKE SURE YOU:  °·   Understand these instructions. °· Will watch your condition. °· Will get help right away if you are not doing well or get worse. °  °This information is not intended to replace advice given to you by your health care provider. Make sure you discuss any questions you have with your health care provider. °  °Document Released: 11/14/2005 Document Revised: 12/05/2014 Document Reviewed: 09/06/2013 °[employer]vier Interactive Patient Education ©2016 [employer] ° °

## 2016-03-07 NOTE — Progress Notes (Signed)
Pre visit review using our clinic review tool, if applicable. No additional management support is needed unless otherwise documented below in the visit note. 

## 2016-03-07 NOTE — Progress Notes (Signed)
Subjective:  Patient ID: [name], male    DOB: 1925/10/28  Age: 82 y.o. MRN: [id number]  CC: Hypothyroidism and Congestive Heart Failure   HPI TRAI Hoffman presents for follow-up on hypothyroidism, diabetes, and congestive heart failure. For the last 10 days he complains of weight gain, worsening lower extremity edema, and worsening shortness of breath. He denies cough, chest pain, palpitations, hemoptysis, lightheaded, or dizziness.  Outpatient Prescriptions Prior to Visit  Medication Sig Dispense Refill  . Amino Acids-Protein Hydrolys (FEEDING SUPPLEMENT, PRO-STAT SUGAR FREE 64,) LIQD Take 30 mLs by mouth 2 (two) times daily.    Marland Kitchen amiodarone (PACERONE) 200 MG tablet Take 1 tablet (200 mg total) by mouth 2 (two) times daily. 180 tablet 2  . apixaban (ELIQUIS) 2.5 MG TABS tablet Take 1 tablet (2.5 mg total) by mouth 2 (two) times daily. 180 tablet 2  . Ascorbic Acid (VITAMIN C) 500 MG tablet Take 500 mg by mouth daily.     Marland Kitchen azelastine (ASTELIN) 0.1 % nasal spray Place 2 sprays into both nostrils 2 (two) times daily. Use in each nostril as directed 90 mL 3  . digoxin (LANOXIN) 0.125 MG tablet Take 1 tablet (0.125 mg total) by mouth every other day. 90 tablet 2  . finasteride (PROSCAR) 5 MG tablet Take 1 tablet (5 mg total) by mouth daily. 90 tablet 3  . glucosamine-chondroitin 500-400 MG tablet Take 1 tablet by mouth daily.    . insulin aspart (NOVOLOG FLEXPEN) 100 UNIT/ML FlexPen Inject 3-6 Units into the skin See admin instructions. Use 6 units at breakfast, use 3 units at lunch and use 5 units at supper    . metoprolol tartrate (LOPRESSOR) 25 MG tablet Take 1 tablet (25 mg total) by mouth 2 (two) times daily. 180 tablet 3  . Multiple Vitamin (MULTIVITAMIN WITH MINERALS) TABS tablet Take 1 tablet by mouth daily.    . tamsulosin (FLOMAX) 0.4 MG CAPS capsule Take 1 capsule (0.4 mg total) by mouth 2 (two) times daily. 180 capsule 3  . CREON 12000 units CPEP capsule Take 1 capsule by  mouth 3  times a day before meals 300 capsule 0  . levothyroxine (SYNTHROID, LEVOTHROID) 25 MCG tablet Take 1 tablet (25 mcg total) by mouth daily before breakfast. 30 tablet 5  . torsemide (DEMADEX) 10 MG tablet Take 1 tablet (10 mg total) by mouth daily. 30 tablet 3   No facility-administered medications prior to visit.    ROS Review of Systems  Constitutional: Positive for fatigue. Negative for fever, chills, diaphoresis, activity change, appetite change and unexpected weight change.  HENT: Negative.   Eyes: Negative.   Respiratory: Positive for shortness of breath. Negative for cough, choking, chest tightness, wheezing and stridor.   Cardiovascular: Positive for leg swelling. Negative for chest pain and palpitations.  Gastrointestinal: Negative.  Negative for nausea, vomiting, abdominal pain, diarrhea and constipation.  Endocrine: Negative.   Genitourinary: Negative.   Musculoskeletal: Negative.  Negative for myalgias, back pain, joint swelling and arthralgias.  Skin: Negative.  Negative for color change and rash.  Allergic/Immunologic: Negative.   Neurological: Negative.  Negative for dizziness, speech difficulty, weakness, light-headedness, numbness and headaches.  Hematological: Negative.  Negative for adenopathy. Does not bruise/bleed easily.  Psychiatric/Behavioral: Negative.     Objective:  BP 100/60 mmHg  Pulse 82  Temp(Src) 97.6 F (36.4 C) (Oral)  Resp 16  Ht 6\' 2"  (1.88 m)  Wt 170 lb (77.111 kg)  BMI 21.82 kg/m2  SpO2 94%  BP Readings from Last 3 Encounters:  03/07/16 100/60  02/15/16 110/64  01/21/16 96/58    Wt Readings from Last 3 Encounters:  03/07/16 170 lb (77.111 kg)  02/15/16 170 lb (77.111 kg)  01/21/16 171 lb (77.565 kg)    Physical Exam  Constitutional: He is oriented to person, place, and time. He appears well-developed and well-nourished. No distress.  HENT:  Mouth/Throat: Oropharynx is clear and moist. No oropharyngeal exudate.  Eyes:  Conjunctivae are normal. Right eye exhibits no discharge. Left eye exhibits no discharge. No scleral icterus.  Neck: Normal range of motion. Neck supple. No JVD present. No tracheal deviation present. No thyromegaly present.  Cardiovascular: Normal rate, regular rhythm, S1 normal, S2 normal and intact distal pulses.  Exam reveals gallop. Exam reveals no S3 and no friction rub.   Murmur heard.  Systolic murmur is present with a grade of 1/6   No diastolic murmur is present  Pulmonary/Chest: Effort normal and breath sounds normal. No stridor. No respiratory distress. He has no wheezes. He has no rales. He exhibits no tenderness.  Abdominal: Soft. Bowel sounds are normal. He exhibits no distension and no mass. There is no tenderness. There is no rebound and no guarding.  Musculoskeletal: Normal range of motion. He exhibits edema (2+ pitting edema in BLE). He exhibits no tenderness.  Lymphadenopathy:    He has no cervical adenopathy.  Neurological: He is oriented to person, place, and time.  Skin: Skin is warm and dry. No rash noted. He is not diaphoretic. No erythema. No pallor.  Psychiatric: He has a normal mood and affect. His behavior is normal. Judgment and thought content normal.  Vitals reviewed.   Lab Results  Component Value Date   WBC 8.4 01/21/2016   HGB 11.4* 01/21/2016   HCT 34.6* 01/21/2016   PLT 267.0 01/21/2016   GLUCOSE 60* 03/07/2016   CHOL 114 10/21/2015   TRIG 59.0 10/21/2015   HDL 46.20 10/21/2015   LDLCALC 56 10/21/2015   ALT 21 12/13/2015   AST 35 12/13/2015   NA 134* 03/07/2016   K 4.2 03/07/2016   CL 99 03/07/2016   CREATININE 1.89* 03/07/2016   BUN 47* 03/07/2016   CO2 29 03/07/2016   TSH 9.90* 03/07/2016   PSA 2.28 01/04/2010   INR 1.44 12/25/2015   HGBA1C 6.7* 01/21/2016   MICROALBUR <0.7 02/25/2015    Dg Chest 2 View  01/20/2016  CLINICAL DATA:  Shortness of breath. Ex-smoker. Followup right pleural effusion. EXAM: CHEST  2 VIEW COMPARISON:   12/30/2015. FINDINGS: The cardiac silhouette remains borderline enlarged. A small caliber pleural catheter on the right is unchanged. No significant change in a small amount of right pleural fluid. There is also a small left pleural effusion. Mild thoracic spine degenerative changes. Upper abdominal surgical clips. IMPRESSION: 1. Stable small right pleural effusion and right pleural catheter. 2. Small left pleural effusion. Electronically Signed   By: Claudie Revering M.D.   On: 01/20/2016 15:43    Assessment & Plan:   Markcus was seen today for hypothyroidism and congestive heart failure.  Diagnoses and all orders for this visit:  Chronic systolic CHF (congestive heart failure) ([place])- he has worsening edema, weight gain, and shortness of breath, and his BNP is elevated, will improve his diuresis by increasing his dose of torsemide and adding on metolazone for the next few weeks. -     metolazone (ZAROXOLYN) 5 MG tablet; Take 1 tablet (5 mg total) by mouth daily. -  torsemide (DEMADEX) 20 MG tablet; Take 1 tablet (20 mg total) by mouth daily. -     Basic metabolic panel; Future -     Brain natriuretic peptide; Future  Other specified hypothyroidism- his TSH is significantly elevated so I have increased his Synthroid dose from 25 g a day to 50 g a day -     TSH; Future -     levothyroxine (SYNTHROID, LEVOTHROID) 50 MCG tablet; Take 1 tablet (50 mcg total) by mouth daily.  Pancreatic insufficiency ([place])- he is doing well on pancreatic supplements -     lipase/protease/amylase (CREON) 12000 units CPEP capsule; Take 1 capsule by mouth 3  times a day before meals  Localized edema- this is related to congestive heart failure, will increase his diuresis. -     metolazone (ZAROXOLYN) 5 MG tablet; Take 1 tablet (5 mg total) by mouth daily. -     torsemide (DEMADEX) 20 MG tablet; Take 1 tablet (20 mg total) by mouth daily.  Recurrent pleural effusion on right- the chest x-ray shows that there has not  been a recurrence of this, this is not the cause for his current symptoms. -     DG Chest 2 View; Future  Other orders -     Cancel: levothyroxine (SYNTHROID, LEVOTHROID) 25 MCG tablet; Take 1 tablet (25 mcg total) by mouth daily before breakfast.  I have discontinued Mr. Himmelreich levothyroxine and torsemide. I have also changed his CREON to lipase/protease/amylase. Additionally, I am having him start on metolazone, torsemide, and levothyroxine. Lastly, I am having him maintain his vitamin C, tamsulosin, finasteride, azelastine, digoxin, amiodarone, metoprolol tartrate, insulin aspart, feeding supplement (PRO-STAT SUGAR FREE 64), glucosamine-chondroitin, multivitamin with minerals, and apixaban.  Meds ordered this encounter  Medications  . lipase/protease/amylase (CREON) 12000 units CPEP capsule    Sig: Take 1 capsule by mouth 3  times a day before meals    Dispense:  300 capsule    Refill:  3  . metolazone (ZAROXOLYN) 5 MG tablet    Sig: Take 1 tablet (5 mg total) by mouth daily.    Dispense:  30 tablet    Refill:  1  . torsemide (DEMADEX) 20 MG tablet    Sig: Take 1 tablet (20 mg total) by mouth daily.    Dispense:  30 tablet    Refill:  1  . levothyroxine (SYNTHROID, LEVOTHROID) 50 MCG tablet    Sig: Take 1 tablet (50 mcg total) by mouth daily.    Dispense:  90 tablet    Refill:  1     Follow-up: Return in about 3 weeks (around 03/28/2016).  [name], MD

## 2016-03-09 ENCOUNTER — Encounter: Payer: Self-pay | Admitting: Internal Medicine

## 2016-03-10 ENCOUNTER — Encounter: Payer: Self-pay | Admitting: Internal Medicine

## 2016-03-11 ENCOUNTER — Encounter: Payer: Self-pay | Admitting: Internal Medicine

## 2016-03-14 ENCOUNTER — Other Ambulatory Visit: Payer: Self-pay

## 2016-03-14 NOTE — Patient Outreach (Signed)
[facility]) Care Management  03/14/2016  [name] 12-12-1924 [id number]   Telephone Screen  Referral Date: 02/24/16 Referral Source: HF readmission report Referral Reason: 3 admissions & 1 ED visit within past 6 months  Multiple attempts to establish contact with patient. No response from letter mailed to patient. Case is being closed at this time.   Plan: RN CM will notify [facility] administrative assistant of case closure. RN CM will send MD case closure letter.  [name], RN,BSN,CCM [place] [facility] Telephonic Care Management Coordinator Direct Phone: [phone] Toll Free: [phone] Fax: [phone]

## 2016-03-18 ENCOUNTER — Other Ambulatory Visit: Payer: Self-pay | Admitting: Internal Medicine

## 2016-03-18 ENCOUNTER — Encounter: Payer: Self-pay | Admitting: Internal Medicine

## 2016-03-21 ENCOUNTER — Encounter: Payer: Self-pay | Admitting: Internal Medicine

## 2016-03-30 ENCOUNTER — Other Ambulatory Visit (INDEPENDENT_AMBULATORY_CARE_PROVIDER_SITE_OTHER): Payer: Medicare Other

## 2016-03-30 ENCOUNTER — Ambulatory Visit (INDEPENDENT_AMBULATORY_CARE_PROVIDER_SITE_OTHER): Payer: Medicare Other | Admitting: Internal Medicine

## 2016-03-30 ENCOUNTER — Encounter: Payer: Self-pay | Admitting: Internal Medicine

## 2016-03-30 ENCOUNTER — Ambulatory Visit
Admission: RE | Admit: 2016-03-30 | Discharge: 2016-03-30 | Disposition: A | Discharge status: Home / Self Care | Payer: Medicare Other | Source: Ambulatory Visit | Attending: Internal Medicine | Admitting: Internal Medicine

## 2016-03-30 ENCOUNTER — Ambulatory Visit (INDEPENDENT_AMBULATORY_CARE_PROVIDER_SITE_OTHER)
Admission: RE | Admit: 2016-03-30 | Discharge: 2016-03-30 | Disposition: A | Discharge status: Home / Self Care | Payer: Medicare Other | Source: Ambulatory Visit | Attending: Internal Medicine | Admitting: Internal Medicine

## 2016-03-30 VITALS — BP 98/58 | HR 68 | Temp 97.6°F | Resp 16 | Ht 74.0 in | Wt 159.0 lb

## 2016-03-30 DIAGNOSIS — I2510 Atherosclerotic heart disease of native coronary artery without angina pectoris: Secondary | ICD-10-CM | POA: Diagnosis not present

## 2016-03-30 DIAGNOSIS — E038 Other specified hypothyroidism: Secondary | ICD-10-CM

## 2016-03-30 DIAGNOSIS — D638 Anemia in other chronic diseases classified elsewhere: Secondary | ICD-10-CM

## 2016-03-30 DIAGNOSIS — J90 Pleural effusion, not elsewhere classified: Secondary | ICD-10-CM

## 2016-03-30 DIAGNOSIS — I5022 Chronic systolic (congestive) heart failure: Secondary | ICD-10-CM

## 2016-03-30 LAB — CBC WITH DIFFERENTIAL/PLATELET
BASOS ABS: 0 10*3/uL (ref 0.0–0.1)
Basophils Relative: 0.2 % (ref 0.0–3.0)
EOS ABS: 0 10*3/uL (ref 0.0–0.7)
EOS PCT: 0.4 % (ref 0.0–5.0)
HCT: 35.4 % — ABNORMAL LOW (ref 39.0–52.0)
HEMOGLOBIN: 11.9 g/dL — AB (ref 13.0–17.0)
LYMPHS ABS: 0.7 10*3/uL (ref 0.7–4.0)
Lymphocytes Relative: 8.6 % — ABNORMAL LOW (ref 12.0–46.0)
MCHC: 33.7 g/dL (ref 30.0–36.0)
MCV: 95.6 fl (ref 78.0–100.0)
MONO ABS: 0.9 10*3/uL (ref 0.1–1.0)
MONOS PCT: 10.3 % (ref 3.0–12.0)
NEUTROS ABS: 6.7 10*3/uL (ref 1.4–7.7)
Neutrophils Relative %: 80.5 % — ABNORMAL HIGH (ref 43.0–77.0)
Platelets: 141 10*3/uL — ABNORMAL LOW (ref 150.0–400.0)
RBC: 3.71 Mil/uL — AB (ref 4.22–5.81)
RDW: 14.8 % (ref 11.5–15.5)
WBC: 8.3 10*3/uL (ref 4.0–10.5)

## 2016-03-30 LAB — TSH: TSH: 7.18 u[IU]/mL — AB (ref 0.35–4.50)

## 2016-03-30 LAB — BASIC METABOLIC PANEL
BUN: 92 mg/dL — AB (ref 6–23)
CO2: 40 meq/L — AB (ref 19–32)
Calcium: 9.1 mg/dL (ref 8.4–10.5)
Chloride: 76 mEq/L — ABNORMAL LOW (ref 96–112)
Creatinine, Ser: 2.59 mg/dL — ABNORMAL HIGH (ref 0.40–1.50)
GFR: 24.95 mL/min — AB (ref >60.00)
GLUCOSE: 117 mg/dL — AB (ref 70–99)
POTASSIUM: 3.8 meq/L (ref 3.5–5.1)
Sodium: 124 mEq/L — ABNORMAL LOW (ref 135–145)

## 2016-03-30 MED ORDER — DIGOXIN 125 MCG PO TABS: 0.1250 mg | ORAL_TABLET | ORAL | Status: DC

## 2016-03-30 NOTE — Patient Instructions (Signed)

## 2016-03-30 NOTE — Progress Notes (Signed)
Subjective:  Patient ID: [name], male    DOB: January 14, 1925  Age: 82 y.o. MRN: [id number]  CC: Congestive Heart Failure and Anemia   HPI [name] presents for CHF f/up. Since I last saw him he has lost weight and the edema in his legs had improved. About a week ago he sent me an email and complained that he thought that he had lost too much weight so I asked him to hold the metolazone over the last week. He said he has done so but has also developed profound weakness and is now wheelchair-bound. He states since stopping the metolazone he thinks he has gained a few more pounds and the edema in his legs is returning. He has had a loss of appetite but no nausea or vomiting. He denies dizziness, lightheadedness, chest pain, shortness of breath, or palpitations. He also complains of fatigue. He has had no diarrhea, rash, paresthesias, or headache.  Outpatient Prescriptions Prior to Visit  Medication Sig Dispense Refill  . Amino Acids-Protein Hydrolys (FEEDING SUPPLEMENT, PRO-STAT SUGAR FREE 64,) LIQD Take 30 mLs by mouth 2 (two) times daily.    Marland Kitchen amiodarone (PACERONE) 200 MG tablet Take 1 tablet (200 mg total) by mouth 2 (two) times daily. 180 tablet 2  . apixaban (ELIQUIS) 2.5 MG TABS tablet Take 1 tablet (2.5 mg total) by mouth 2 (two) times daily. 180 tablet 2  . Ascorbic Acid (VITAMIN C) 500 MG tablet Take 500 mg by mouth daily.     Marland Kitchen azelastine (ASTELIN) 0.1 % nasal spray Place 2 sprays into both nostrils 2 (two) times daily. Use in each nostril as directed 90 mL 3  . finasteride (PROSCAR) 5 MG tablet Take 1 tablet (5 mg total) by mouth daily. 90 tablet 3  . glucosamine-chondroitin 500-400 MG tablet Take 1 tablet by mouth daily.    . insulin aspart (NOVOLOG FLEXPEN) 100 UNIT/ML FlexPen Inject 3-6 Units into the skin See admin instructions. Use 6 units at breakfast, use 3 units at lunch and use 5 units at supper    . levothyroxine (SYNTHROID, LEVOTHROID) 25 MCG tablet Take 1 tablet by  mouth  daily before breakfast 90 tablet 0  . levothyroxine (SYNTHROID, LEVOTHROID) 50 MCG tablet Take 1 tablet (50 mcg total) by mouth daily. 90 tablet 1  . lipase/protease/amylase (CREON) 12000 units CPEP capsule Take 1 capsule by mouth 3  times a day before meals 300 capsule 3  . metolazone (ZAROXOLYN) 5 MG tablet Take 1 tablet (5 mg total) by mouth daily. 30 tablet 1  . metoprolol tartrate (LOPRESSOR) 25 MG tablet Take 1 tablet (25 mg total) by mouth 2 (two) times daily. 180 tablet 3  . Multiple Vitamin (MULTIVITAMIN WITH MINERALS) TABS tablet Take 1 tablet by mouth daily.    . tamsulosin (FLOMAX) 0.4 MG CAPS capsule Take 1 capsule (0.4 mg total) by mouth 2 (two) times daily. 180 capsule 3  . torsemide (DEMADEX) 20 MG tablet Take 1 tablet (20 mg total) by mouth daily. 30 tablet 1  . digoxin (LANOXIN) 0.125 MG tablet Take 1 tablet (0.125 mg total) by mouth every other day. 90 tablet 2   No facility-administered medications prior to visit.    ROS Review of Systems  Constitutional: Positive for fatigue and unexpected weight change. Negative for fever, chills, diaphoresis and appetite change.  HENT: Negative.   Eyes: Negative.   Respiratory: Negative.  Negative for cough, choking, chest tightness, shortness of breath and stridor.   Cardiovascular:  Positive for leg swelling. Negative for chest pain and palpitations.  Gastrointestinal: Negative.  Negative for nausea, vomiting, abdominal pain, diarrhea, constipation and blood in stool.  Endocrine: Negative.   Genitourinary: Negative.  Negative for dysuria, urgency, frequency, hematuria, flank pain, enuresis and difficulty urinating.  Musculoskeletal: Negative.  Negative for myalgias, back pain, joint swelling, arthralgias and neck pain.  Skin: Negative.   Allergic/Immunologic: Negative.   Neurological: Negative.  Negative for dizziness, tremors, weakness, light-headedness, numbness and headaches.  Hematological: Negative.  Negative for  adenopathy. Does not bruise/bleed easily.  Psychiatric/Behavioral: Negative.     Objective:  BP 98/58 mmHg  Pulse 68  Temp(Src) 97.6 F (36.4 C) (Oral)  Resp 16  Ht 6' 2"  (1.88 m)  Wt 159 lb (72.122 kg)  BMI 20.41 kg/m2  SpO2 98%  BP Readings from Last 3 Encounters:  03/30/16 98/58  03/07/16 100/60  02/15/16 110/64    Wt Readings from Last 3 Encounters:  03/30/16 159 lb (72.122 kg)  03/07/16 170 lb (77.111 kg)  02/15/16 170 lb (77.111 kg)    Physical Exam  Constitutional:  Non-toxic appearance. He has a sickly appearance. He appears ill. No distress.  Frail, weak, wheelchair-bound, unable to stand  HENT:  Mouth/Throat: Oropharynx is clear and moist. No oropharyngeal exudate.  Eyes: Conjunctivae are normal. Right eye exhibits no discharge. Left eye exhibits no discharge. No scleral icterus.  Neck: Normal range of motion. Neck supple. No JVD present. No tracheal deviation present. No thyromegaly present.  Cardiovascular: Normal rate, regular rhythm, normal heart sounds and intact distal pulses.  Exam reveals no gallop and no friction rub.   No murmur heard. Pulmonary/Chest: Effort normal and breath sounds normal. No stridor. No respiratory distress. He has no wheezes. He has no rales. He exhibits no tenderness.  Abdominal: Soft. Bowel sounds are normal. He exhibits no distension and no mass. There is no tenderness. There is no rebound and no guarding.  Musculoskeletal: He exhibits edema (2+ pitting edema in bilateral lower extremities). He exhibits no tenderness.  Lymphadenopathy:    He has no cervical adenopathy.  Skin: Skin is warm and dry. No rash noted. He is not diaphoretic. No erythema. No pallor.  Psychiatric: He has a normal mood and affect. His behavior is normal. Judgment and thought content normal.  Vitals reviewed.   Lab Results  Component Value Date   WBC 8.3 03/30/2016   HGB 11.9* 03/30/2016   HCT 35.4* 03/30/2016   PLT 141.0* 03/30/2016   GLUCOSE 117*  03/30/2016   CHOL 114 10/21/2015   TRIG 59.0 10/21/2015   HDL 46.20 10/21/2015   LDLCALC 56 10/21/2015   ALT 21 12/13/2015   AST 35 12/13/2015   NA 124* 03/30/2016   K 3.8 03/30/2016   CL 76* 03/30/2016   CREATININE 2.59* 03/30/2016   BUN 92* 03/30/2016   CO2 40* 03/30/2016   TSH 7.18* 03/30/2016   PSA 2.28 01/04/2010   INR 1.44 12/25/2015   HGBA1C 6.7* 01/21/2016   MICROALBUR <0.7 02/25/2015    Dg Chest 2 View  03/07/2016  CLINICAL DATA:  History of right pleural effusion and status post insertion and removal of tunneled PleurX drainage catheter. Known pulmonary artery catheter fragment. EXAM: CHEST - 2 VIEW COMPARISON:  01/20/2016, CTA of the chest on 11/05/2015 and other prior chest x-rays. FINDINGS: Retained catheter fragment extending from the main pulmonary artery out into the mid right lung is stable. This was present as far back as 04/23/2007 by chest x-ray. There is  no recurrent significant right pleural effusion. There may be tiny bilateral pleural effusions. Stable chronic lung disease. There is an increased scarring and atelectasis at both lung bases. Interstitial and vascular prominence noted without overt pulmonary edema. No pneumothorax. Suggestion of 8 mm density overlying the left lateral mid lung. The inferior margin of this density is likely the bottom margin of the third rib. Subtle pulmonary nodule cannot be entirely excluded by x-ray. The visualized skeletal structures are unremarkable. IMPRESSION: 1. No significant recurrence of right pleural effusion. 2. Increased scarring and atelectasis in both lower lungs, right greater than left. 3. Suggestion of small nodular density overlying the left lateral mid lung. No pulmonary nodule was seen on the 11/05/2015 CTA of the chest. Follow-up chest x-ray would be appropriate. Electronically Signed   By: Aletta Edouard M.D.   On: 03/07/2016 15:22    Assessment & Plan:   Bronislaw was seen today for congestive heart failure and  anemia.  Diagnoses and all orders for this visit:  Recurrent pleural effusion on right- based on exam and symptoms it appears that the pleural effusion has resolved, will recheck his chest x-ray to be certain there hasn't been an additional worsening or complication. -     Ambulatory referral to Hospice -     Cancel: DG Knee Complete 4 Views Right; Future -     DG Chest 2 View; Future  Other specified hypothyroidism- his TSH is improving, will continue the current thyroid replacement regimen. -     TSH; Future  Anemia of chronic disease- this is stable -     CBC with Differential/Platelet; Future -     Ambulatory referral to Hospice  Chronic systolic CHF (congestive heart failure) ([place])- his heart failure symptoms have improved somewhat but now he appears to be over diuresed with a metabolic alkalosis, low-sodium, low chloride, prerenal azotemia and worsening renal function with a myriad of symptoms. I have given him the option of entering into hospice, treating the dehydration with oral liquids at home, or being admitted to the hospital for gentle hydration and further evaluation of the congestive heart failure. At the time of this dictation I do not have his response or his family's response yet. -     digoxin (LANOXIN) 0.125 MG tablet; Take 1 tablet (0.125 mg total) by mouth every other day. -     Basic metabolic panel; Future -     Ambulatory referral to Hospice  I am having [name] maintain his vitamin C, tamsulosin, finasteride, azelastine, amiodarone, metoprolol tartrate, insulin aspart, feeding supplement (PRO-STAT SUGAR FREE 64), glucosamine-chondroitin, multivitamin with minerals, apixaban, lipase/protease/amylase, metolazone, torsemide, levothyroxine, levothyroxine, and digoxin.  Meds ordered this encounter  Medications  . digoxin (LANOXIN) 0.125 MG tablet    Sig: Take 1 tablet (0.125 mg total) by mouth every other day.    Dispense:  90 tablet    Refill:  2      Follow-up: Return if symptoms worsen or fail to improve.  [name], MD

## 2016-03-30 NOTE — Progress Notes (Signed)
Pre visit review using our clinic review tool, if applicable. No additional management support is needed unless otherwise documented below in the visit note. 

## 2016-03-31 ENCOUNTER — Encounter: Payer: Self-pay | Admitting: Internal Medicine

## 2016-03-31 ENCOUNTER — Telehealth: Payer: Self-pay | Admitting: Internal Medicine

## 2016-03-31 NOTE — Telephone Encounter (Signed)
I will stay as PCP

## 2016-03-31 NOTE — Telephone Encounter (Signed)
I called hospice regarding referral and she is wanting to know if Dr. Ronnald Ramp will be the attending and if he wants their physicians to do the symptom mgmt.

## 2016-03-31 NOTE — Telephone Encounter (Signed)
Please advise 

## 2016-04-01 ENCOUNTER — Emergency Department (HOSPITAL_COMMUNITY): Payer: Medicare Other

## 2016-04-01 ENCOUNTER — Other Ambulatory Visit: Payer: Self-pay | Admitting: Internal Medicine

## 2016-04-01 ENCOUNTER — Inpatient Hospital Stay (HOSPITAL_COMMUNITY)
Admission: EM | Admit: 2016-04-01 | Discharge: 2016-04-05 | DRG: 683 | Disposition: A | Discharge status: Home / Self Care | Payer: Medicare Other | Attending: Internal Medicine | Admitting: Internal Medicine

## 2016-04-01 ENCOUNTER — Encounter (HOSPITAL_COMMUNITY): Payer: Self-pay | Admitting: Emergency Medicine

## 2016-04-01 DIAGNOSIS — Z9842 Cataract extraction status, left eye: Secondary | ICD-10-CM

## 2016-04-01 DIAGNOSIS — Z9841 Cataract extraction status, right eye: Secondary | ICD-10-CM

## 2016-04-01 DIAGNOSIS — Z880 Allergy status to penicillin: Secondary | ICD-10-CM | POA: Diagnosis not present

## 2016-04-01 DIAGNOSIS — Z888 Allergy status to other drugs, medicaments and biological substances status: Secondary | ICD-10-CM

## 2016-04-01 DIAGNOSIS — Y92002 Bathroom of unspecified non-institutional (private) residence single-family (private) house as the place of occurrence of the external cause: Secondary | ICD-10-CM

## 2016-04-01 DIAGNOSIS — E039 Hypothyroidism, unspecified: Secondary | ICD-10-CM | POA: Diagnosis present

## 2016-04-01 DIAGNOSIS — I5022 Chronic systolic (congestive) heart failure: Secondary | ICD-10-CM | POA: Diagnosis present

## 2016-04-01 DIAGNOSIS — Z87891 Personal history of nicotine dependence: Secondary | ICD-10-CM

## 2016-04-01 DIAGNOSIS — Z794 Long term (current) use of insulin: Secondary | ICD-10-CM

## 2016-04-01 DIAGNOSIS — E873 Alkalosis: Secondary | ICD-10-CM | POA: Diagnosis not present

## 2016-04-01 DIAGNOSIS — I130 Hypertensive heart and chronic kidney disease with heart failure and stage 1 through stage 4 chronic kidney disease, or unspecified chronic kidney disease: Secondary | ICD-10-CM | POA: Diagnosis present

## 2016-04-01 DIAGNOSIS — N182 Chronic kidney disease, stage 2 (mild): Secondary | ICD-10-CM | POA: Diagnosis present

## 2016-04-01 DIAGNOSIS — N179 Acute kidney failure, unspecified: Principal | ICD-10-CM | POA: Diagnosis present

## 2016-04-01 DIAGNOSIS — T460X5A Adverse effect of cardiac-stimulant glycosides and drugs of similar action, initial encounter: Secondary | ICD-10-CM | POA: Diagnosis present

## 2016-04-01 DIAGNOSIS — Z881 Allergy status to other antibiotic agents status: Secondary | ICD-10-CM

## 2016-04-01 DIAGNOSIS — Z961 Presence of intraocular lens: Secondary | ICD-10-CM | POA: Diagnosis present

## 2016-04-01 DIAGNOSIS — T460X1A Poisoning by cardiac-stimulant glycosides and drugs of similar action, accidental (unintentional), initial encounter: Secondary | ICD-10-CM

## 2016-04-01 DIAGNOSIS — W010XXA Fall on same level from slipping, tripping and stumbling without subsequent striking against object, initial encounter: Secondary | ICD-10-CM | POA: Diagnosis present

## 2016-04-01 DIAGNOSIS — E871 Hypo-osmolality and hyponatremia: Secondary | ICD-10-CM | POA: Diagnosis present

## 2016-04-01 DIAGNOSIS — Z90411 Acquired partial absence of pancreas: Secondary | ICD-10-CM | POA: Diagnosis not present

## 2016-04-01 DIAGNOSIS — E1122 Type 2 diabetes mellitus with diabetic chronic kidney disease: Secondary | ICD-10-CM | POA: Diagnosis present

## 2016-04-01 DIAGNOSIS — R531 Weakness: Secondary | ICD-10-CM | POA: Diagnosis not present

## 2016-04-01 DIAGNOSIS — Z7901 Long term (current) use of anticoagulants: Secondary | ICD-10-CM | POA: Diagnosis not present

## 2016-04-01 DIAGNOSIS — I2510 Atherosclerotic heart disease of native coronary artery without angina pectoris: Secondary | ICD-10-CM | POA: Diagnosis present

## 2016-04-01 DIAGNOSIS — T460X1D Poisoning by cardiac-stimulant glycosides and drugs of similar action, accidental (unintentional), subsequent encounter: Secondary | ICD-10-CM | POA: Diagnosis not present

## 2016-04-01 DIAGNOSIS — E860 Dehydration: Secondary | ICD-10-CM | POA: Diagnosis present

## 2016-04-01 DIAGNOSIS — I4819 Other persistent atrial fibrillation: Secondary | ICD-10-CM | POA: Diagnosis present

## 2016-04-01 DIAGNOSIS — N189 Chronic kidney disease, unspecified: Secondary | ICD-10-CM | POA: Diagnosis not present

## 2016-04-01 DIAGNOSIS — E118 Type 2 diabetes mellitus with unspecified complications: Secondary | ICD-10-CM | POA: Diagnosis present

## 2016-04-01 DIAGNOSIS — E878 Other disorders of electrolyte and fluid balance, not elsewhere classified: Secondary | ICD-10-CM

## 2016-04-01 DIAGNOSIS — K8681 Exocrine pancreatic insufficiency: Secondary | ICD-10-CM | POA: Diagnosis present

## 2016-04-01 DIAGNOSIS — Z79899 Other long term (current) drug therapy: Secondary | ICD-10-CM

## 2016-04-01 DIAGNOSIS — I481 Persistent atrial fibrillation: Secondary | ICD-10-CM | POA: Diagnosis present

## 2016-04-01 DIAGNOSIS — E869 Volume depletion, unspecified: Secondary | ICD-10-CM | POA: Diagnosis not present

## 2016-04-01 LAB — URINALYSIS, ROUTINE W REFLEX MICROSCOPIC
BILIRUBIN URINE: NEGATIVE
GLUCOSE, UA: NEGATIVE mg/dL
Hgb urine dipstick: NEGATIVE
KETONES UR: NEGATIVE mg/dL
Leukocytes, UA: NEGATIVE
NITRITE: NEGATIVE
PH: 6 (ref 5.0–8.0)
Protein, ur: NEGATIVE mg/dL
Specific Gravity, Urine: 1.014 (ref 1.005–1.030)

## 2016-04-01 LAB — COMPREHENSIVE METABOLIC PANEL
ALBUMIN: 2.8 g/dL — AB (ref 3.5–5.0)
ALT: 161 U/L — ABNORMAL HIGH (ref 17–63)
ANION GAP: 10 (ref 5–15)
AST: 192 U/L — ABNORMAL HIGH (ref 15–41)
Alkaline Phosphatase: 135 U/L — ABNORMAL HIGH (ref 38–126)
BILIRUBIN TOTAL: 1.3 mg/dL — AB (ref 0.3–1.2)
BUN: 94 mg/dL — ABNORMAL HIGH (ref 6–20)
CHLORIDE: 77 mmol/L — AB (ref 101–111)
CO2: 40 mmol/L — ABNORMAL HIGH (ref 22–32)
Calcium: 8.9 mg/dL (ref 8.9–10.3)
Creatinine, Ser: 2.63 mg/dL — ABNORMAL HIGH (ref 0.61–1.24)
GFR calc Af Amer: 23 mL/min — ABNORMAL LOW (ref >60)
GFR calc non Af Amer: 20 mL/min — ABNORMAL LOW (ref >60)
GLUCOSE: 77 mg/dL (ref 65–99)
POTASSIUM: 4.1 mmol/L (ref 3.5–5.1)
SODIUM: 127 mmol/L — AB (ref 135–145)
TOTAL PROTEIN: 6.5 g/dL (ref 6.5–8.1)

## 2016-04-01 LAB — CBC WITH DIFFERENTIAL/PLATELET
BASOS ABS: 0 10*3/uL (ref 0.0–0.1)
BASOS PCT: 0 %
EOS ABS: 0.1 10*3/uL (ref 0.0–0.7)
EOS PCT: 2 %
HEMATOCRIT: 33.5 % — AB (ref 39.0–52.0)
Hemoglobin: 11.2 g/dL — ABNORMAL LOW (ref 13.0–17.0)
Lymphocytes Relative: 11 %
Lymphs Abs: 0.7 10*3/uL (ref 0.7–4.0)
MCH: 31.8 pg (ref 26.0–34.0)
MCHC: 33.4 g/dL (ref 30.0–36.0)
MCV: 95.2 fL (ref 78.0–100.0)
MONO ABS: 1.3 10*3/uL — AB (ref 0.1–1.0)
Monocytes Relative: 19 %
NEUTROS ABS: 4.5 10*3/uL (ref 1.7–7.7)
Neutrophils Relative %: 68 %
PLATELETS: 130 10*3/uL — AB (ref 150–400)
RBC: 3.52 MIL/uL — ABNORMAL LOW (ref 4.22–5.81)
RDW: 14.3 % (ref 11.5–15.5)
WBC: 6.6 10*3/uL (ref 4.0–10.5)

## 2016-04-01 LAB — BASIC METABOLIC PANEL
Anion gap: 9 (ref 5–15)
BUN: 90 mg/dL — AB (ref 6–20)
CHLORIDE: 79 mmol/L — AB (ref 101–111)
CO2: 40 mmol/L — AB (ref 22–32)
CREATININE: 2.53 mg/dL — AB (ref 0.61–1.24)
Calcium: 8.7 mg/dL — ABNORMAL LOW (ref 8.9–10.3)
GFR calc Af Amer: 25 mL/min — ABNORMAL LOW (ref >60)
GFR calc non Af Amer: 21 mL/min — ABNORMAL LOW (ref >60)
GLUCOSE: 96 mg/dL (ref 65–99)
POTASSIUM: 4.3 mmol/L (ref 3.5–5.1)
SODIUM: 128 mmol/L — AB (ref 135–145)

## 2016-04-01 LAB — DIGOXIN LEVEL
DIGOXIN LVL: 2.8 ng/mL — AB (ref 0.8–2.0)
Digoxin Level: 2.6 ng/mL (ref 0.8–2.0)

## 2016-04-01 LAB — GLUCOSE, CAPILLARY
GLUCOSE-CAPILLARY: 88 mg/dL (ref 65–99)
Glucose-Capillary: 199 mg/dL — ABNORMAL HIGH (ref 65–99)

## 2016-04-01 LAB — BRAIN NATRIURETIC PEPTIDE: B NATRIURETIC PEPTIDE 5: 812.2 pg/mL — AB (ref 0.0–100.0)

## 2016-04-01 LAB — TROPONIN I
TROPONIN I: 0.08 ng/mL — AB (ref <0.031)
TROPONIN I: 0.08 ng/mL — AB (ref <0.031)
Troponin I: 0.07 ng/mL — ABNORMAL HIGH (ref <0.031)

## 2016-04-01 LAB — PROTIME-INR
INR: 1.65 — AB (ref 0.00–1.49)
Prothrombin Time: 19.6 seconds — ABNORMAL HIGH (ref 11.6–15.2)

## 2016-04-01 MED ORDER — POLYETHYLENE GLYCOL 3350 17 G PO PACK
17.0000 g | PACK | Freq: Every day | ORAL | Status: DC | PRN
  Filled 2016-04-01: qty 1

## 2016-04-01 MED ORDER — APIXABAN 2.5 MG PO TABS
2.5000 mg | ORAL_TABLET | Freq: Two times a day (BID) | ORAL | Status: DC
  Administered 2016-04-01 – 2016-04-05 (×8): 2.5 mg via ORAL
  Filled 2016-04-01 (×9): qty 1

## 2016-04-01 MED ORDER — METOPROLOL TARTRATE 25 MG PO TABS
25.0000 mg | ORAL_TABLET | Freq: Two times a day (BID) | ORAL | Status: DC
  Administered 2016-04-02 – 2016-04-05 (×3): 25 mg via ORAL
  Filled 2016-04-01 (×7): qty 1

## 2016-04-01 MED ORDER — LEVOTHYROXINE SODIUM 75 MCG PO TABS: 75.0000 ug | ORAL_TABLET | Freq: Every day | ORAL | Status: DC

## 2016-04-01 MED ORDER — LEVOTHYROXINE SODIUM 25 MCG PO TABS: 25.0000 ug | ORAL_TABLET | Freq: Every day | ORAL | Status: DC

## 2016-04-01 MED ORDER — ONDANSETRON HCL 4 MG PO TABS: 4.0000 mg | ORAL_TABLET | Freq: Four times a day (QID) | ORAL | Status: DC | PRN

## 2016-04-01 MED ORDER — SODIUM CHLORIDE 0.9 % IV SOLN: INTRAVENOUS | Status: AC

## 2016-04-01 MED ORDER — AZELASTINE HCL 0.1 % NA SOLN
2.0000 | Freq: Two times a day (BID) | NASAL | Status: DC
  Administered 2016-04-02 – 2016-04-04 (×3): 2 via NASAL
  Filled 2016-04-01 (×2): qty 30

## 2016-04-01 MED ORDER — TAMSULOSIN HCL 0.4 MG PO CAPS
0.4000 mg | ORAL_CAPSULE | Freq: Two times a day (BID) | ORAL | Status: DC
  Administered 2016-04-01 – 2016-04-05 (×9): 0.4 mg via ORAL
  Filled 2016-04-01 (×9): qty 1

## 2016-04-01 MED ORDER — VITAMIN C 500 MG PO TABS
500.0000 mg | ORAL_TABLET | Freq: Every day | ORAL | Status: DC
  Administered 2016-04-02 – 2016-04-05 (×4): 500 mg via ORAL
  Filled 2016-04-01 (×4): qty 1

## 2016-04-01 MED ORDER — FINASTERIDE 5 MG PO TABS
5.0000 mg | ORAL_TABLET | Freq: Every day | ORAL | Status: DC
  Administered 2016-04-02 – 2016-04-05 (×4): 5 mg via ORAL
  Filled 2016-04-01 (×4): qty 1

## 2016-04-01 MED ORDER — INSULIN ASPART 100 UNIT/ML ~~LOC~~ SOLN
0.0000 [IU] | Freq: Three times a day (TID) | SUBCUTANEOUS | Status: DC
  Administered 2016-04-02: 1 [IU] via SUBCUTANEOUS
  Administered 2016-04-02 (×2): 2 [IU] via SUBCUTANEOUS
  Administered 2016-04-03: 1 [IU] via SUBCUTANEOUS
  Administered 2016-04-03: 2 [IU] via SUBCUTANEOUS
  Administered 2016-04-03: 1 [IU] via SUBCUTANEOUS
  Administered 2016-04-04: 2 [IU] via SUBCUTANEOUS
  Administered 2016-04-04: 1 [IU] via SUBCUTANEOUS
  Administered 2016-04-04: 2 [IU] via SUBCUTANEOUS
  Administered 2016-04-05: 1 [IU] via SUBCUTANEOUS

## 2016-04-01 MED ORDER — SODIUM CHLORIDE 0.9% FLUSH
3.0000 mL | Freq: Two times a day (BID) | INTRAVENOUS | Status: DC
  Administered 2016-04-01 – 2016-04-04 (×7): 3 mL via INTRAVENOUS

## 2016-04-01 MED ORDER — PRO-STAT SUGAR FREE PO LIQD
30.0000 mL | Freq: Two times a day (BID) | ORAL | Status: DC
  Administered 2016-04-01 – 2016-04-05 (×7): 30 mL via ORAL
  Filled 2016-04-01 (×9): qty 30

## 2016-04-01 MED ORDER — ONDANSETRON HCL 4 MG/2ML IJ SOLN: 4.0000 mg | Freq: Four times a day (QID) | INTRAMUSCULAR | Status: DC | PRN

## 2016-04-01 MED ORDER — BISACODYL 5 MG PO TBEC
5.0000 mg | DELAYED_RELEASE_TABLET | Freq: Every day | ORAL | Status: DC | PRN
  Filled 2016-04-01: qty 1

## 2016-04-01 MED ORDER — LEVOTHYROXINE SODIUM 50 MCG PO TABS
50.0000 ug | ORAL_TABLET | Freq: Every day | ORAL | Status: DC
  Administered 2016-04-02 – 2016-04-05 (×4): 50 ug via ORAL
  Filled 2016-04-01 (×4): qty 1

## 2016-04-01 MED ORDER — PANCRELIPASE (LIP-PROT-AMYL) 12000-38000 UNITS PO CPEP
12000.0000 [IU] | ORAL_CAPSULE | Freq: Three times a day (TID) | ORAL | Status: DC
  Administered 2016-04-01 – 2016-04-05 (×12): 12000 [IU] via ORAL
  Filled 2016-04-01 (×13): qty 1

## 2016-04-01 MED ORDER — SODIUM CHLORIDE 0.9 % IV SOLN
Freq: Once | INTRAVENOUS | Status: AC
  Administered 2016-04-01: 11:00:00 via INTRAVENOUS

## 2016-04-01 MED ORDER — AMIODARONE HCL 200 MG PO TABS
200.0000 mg | ORAL_TABLET | Freq: Two times a day (BID) | ORAL | Status: DC
  Administered 2016-04-01 – 2016-04-04 (×6): 200 mg via ORAL
  Filled 2016-04-01 (×7): qty 1

## 2016-04-01 MED ORDER — TRAZODONE HCL 50 MG PO TABS: 25.0000 mg | ORAL_TABLET | Freq: Every evening | ORAL | Status: DC | PRN

## 2016-04-01 MED ORDER — SODIUM CHLORIDE 0.9 % IV BOLUS (SEPSIS)
500.0000 mL | Freq: Once | INTRAVENOUS | Status: AC
  Administered 2016-04-01: 500 mL via INTRAVENOUS

## 2016-04-01 NOTE — ED Provider Notes (Signed)
CSN: [id number]     Arrival date & time 04/01/16  0957 History   First MD Initiated Contact with Patient 04/01/16 1031     Chief Complaint  Patient presents with  . sent by MD      (Consider location/radiation/quality/duration/timing/severity/associated sxs/prior Treatment) HPI Comments: Patient sent by PCP with electrolyte abnormalities on recent lab work. Patient states he has not felt well since he was diagnosed with heart failure in December. His doctor increased his diuretics about 2 weeks ago and now feels that he is overly diuresed and dehydrated. Patient denies any pain currently. Denies any shortness of breath unless he tries to move around. He had a fall in the bathroom 3 nights ago but denies hitting his head and denies having any other injuries. He uses a walker at baseline. He was told by his doctor that he had low sodium, elevated creatinine and was dehydrated. There is discussion of hospice but this has not yet been activated. He has a palliative care consultation this coming Monday. In 3 days. Denies chest pain, shortness of breath denies abdominal pain. Denies dizziness or lightheadedness. Denies fever or vomiting.  The history is provided by the patient and a relative.    Past Medical History  Diagnosis Date  . Acid reflux disease     history of  . H/O: GI bleed   . Persistent atrial fibrillation ([place])     a. Failed tikosyn. b. DCCV did not hold 07/2015.  Marland Kitchen Benign prostatic hypertrophy     history of  . Hepatic damage march 2009    retained hepatic stone , intrahepatic duct catheter  . CAD (coronary artery disease)     a. R/LHC 11/09/15: mild nonobstructive CAD ([facility]) with well- compensated hemodynamics with low filling pressures.  . Hypertension   . 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 Seizures ([place])   . Ventricular  tachycardia ? Tikosyn proarrhtyhmia   . Cataracts, bilateral   . Diabetes mellitus     insulin dep  . Pleural effusion 10/2015  . Acute on chronic systolic CHF (congestive heart failure) ([place])   . CKD (chronic kidney disease), stage II   . HCAP (healthcare-associated pneumonia) 12/10/2015  . Hypothyroid   . Pancreatic insufficiency ([place])   . Systolic CHF [facility])    Past Surgical History  Procedure Laterality Date  . Cataract extraction w/ intraocular lens  implant, bilateral Bilateral ~ 2014  . Cholecystectomy    . Partial gastrectomy    . Orif shoulder fracture Left 1941    2 pens inserted  . Whipple procedure      operation  . Tumor excision Right     sarcoma removal from tricep  . Inguinal hernia repair Right X 2  . Appendectomy    . Cardioversion  03/07/2012    Procedure: CARDIOVERSION;  Surgeon: Deboraha Sprang, MD;  Location: [facility];  Service: Cardiovascular;  Laterality: N/A;  . Cardioversion N/A 08/10/2015    Procedure: CARDIOVERSION;  Surgeon: Thayer Headings, MD;  Location: [facility] ENDOSCOPY;  Service: Cardiovascular;  Laterality: N/A;  . Cardiac catheterization N/A 11/09/2015    Procedure: Right/Left Heart Cath and Coronary Angiography;  Surgeon: Jolaine Artist, MD;  Location: [facility] CV LAB;  Service: Cardiovascular;  Laterality: N/A;  . Chest tube insertion Right 12/04/2015  Procedure: INSERTION RIGHT PLEURAL DRAINAGE CATHETER;  Surgeon: Gaye Pollack, MD;  Location: [facility] OR;  Service: Thoracic;  Laterality: Right;  . Fracture surgery    . Thoracentesis    . Removal of pleural drainage catheter Right 01/01/2016    Procedure: REMOVAL OF PLEURAL DRAINAGE CATHETER;  Surgeon: Gaye Pollack, MD;  Location: [facility] OR;  Service: Thoracic;  Laterality: Right;   Family History  Problem Relation Age of Onset  . Coronary artery disease Father 29  . Heart disease Father     fatal MI  . Osteoarthritis Mother 10  . Arthritis Mother   . Coronary artery disease Brother     cabg, avr,  pvd with sents  . Heart disease Brother     CABG, PVD  . Peripheral vascular disease Sister 49  . Fibromyalgia Sister   . Arthritis Sister   . Coronary artery disease Sister   . Diabetes Sister   . Cancer Brother    Social History  Substance Use Topics  . Smoking status: Former Smoker    Types: Pipe    Quit date: 03/14/1991  . Smokeless tobacco: Never Used  . Alcohol Use: No    Review of Systems  Constitutional: Positive for activity change, appetite change and fatigue. Negative for fever.  HENT: Negative for congestion.   Eyes: Negative for visual disturbance.  Respiratory: Negative for cough, chest tightness and shortness of breath.   Cardiovascular: Positive for leg swelling. Negative for chest pain.  Gastrointestinal: Negative for nausea, vomiting and abdominal pain.  Genitourinary: Negative for dysuria and hematuria.  Musculoskeletal: Negative for myalgias and arthralgias.  Neurological: Negative for weakness, light-headedness and numbness.  A complete 10 system review of systems was obtained and all systems are negative except as noted in the HPI and PMH.      Allergies  Lisinopril; Toujeo solostar; Clindamycin; Other; Oxycodone-acetaminophen; and Penicillins  Home Medications   Prior to Admission medications   Medication Sig Start Date End Date Taking? Authorizing Provider  Amino Acids-Protein Hydrolys (FEEDING SUPPLEMENT, PRO-STAT SUGAR FREE 64,) LIQD Take 30 mLs by mouth 2 (two) times daily.    Historical Provider, MD  amiodarone (PACERONE) 200 MG tablet Take 1 tablet (200 mg total) by mouth 2 (two) times daily. 11/27/15   Renee Dyane Dustman, PA-C  apixaban (ELIQUIS) 2.5 MG TABS tablet Take 1 tablet (2.5 mg total) by mouth 2 (two) times daily. 02/15/16   Renee Dyane Dustman, PA-C  Ascorbic Acid (VITAMIN C) 500 MG tablet Take 500 mg by mouth daily.     Historical Provider, MD  azelastine (ASTELIN) 0.1 % nasal spray Place 2 sprays into both nostrils 2 (two) times daily.  Use in each nostril as directed 10/21/15   Janith Lima, MD  digoxin (LANOXIN) 0.125 MG tablet Take 1 tablet (0.125 mg total) by mouth every other day. 03/30/16   Janith Lima, MD  finasteride (PROSCAR) 5 MG tablet Take 1 tablet (5 mg total) by mouth daily. 06/10/15   Janith Lima, MD  glucosamine-chondroitin 500-400 MG tablet Take 1 tablet by mouth daily.    Historical Provider, MD  insulin aspart (NOVOLOG FLEXPEN) 100 UNIT/ML FlexPen Inject 3-6 Units into the skin See admin instructions. Use 6 units at breakfast, use 3 units at lunch and use 5 units at supper    Historical Provider, MD  levothyroxine (SYNTHROID, LEVOTHROID) 25 MCG tablet Take 1 tablet by mouth  daily before breakfast 03/18/16   Biagio Borg, MD  levothyroxine (  SYNTHROID, LEVOTHROID) 50 MCG tablet Take 1 tablet (50 mcg total) by mouth daily. 03/07/16   Janith Lima, MD  lipase/protease/amylase (CREON) 12000 units CPEP capsule Take 1 capsule by mouth 3  times a day before meals 03/07/16   Janith Lima, MD  metolazone (ZAROXOLYN) 5 MG tablet Take 1 tablet (5 mg total) by mouth daily. 03/07/16   Janith Lima, MD  metoprolol tartrate (LOPRESSOR) 25 MG tablet Take 1 tablet (25 mg total) by mouth 2 (two) times daily. 12/01/15   Renee Dyane Dustman, PA-C  Multiple Vitamin (MULTIVITAMIN WITH MINERALS) TABS tablet Take 1 tablet by mouth daily.    Historical Provider, MD  tamsulosin (FLOMAX) 0.4 MG CAPS capsule Take 1 capsule (0.4 mg total) by mouth 2 (two) times daily. 06/10/15   Janith Lima, MD  torsemide [facility]) 10 MG tablet Take 1 tablet by mouth  daily 04/01/16   Janith Lima, MD  torsemide (DEMADEX) 20 MG tablet Take 1 tablet (20 mg total) by mouth daily. 03/07/16   Janith Lima, MD   BP 102/69 mmHg  Pulse 51  Temp(Src) 97.7 F (36.5 C) (Oral)  Resp 24  Ht 6\' 3"  (1.905 m)  Wt 160 lb 4 oz (72.689 kg)  BMI 20.03 kg/m2  SpO2 94% Physical Exam  Constitutional: He is oriented to person, place, and time. He appears  well-developed and well-nourished. No distress.  HENT:  Head: Normocephalic and atraumatic.  Mouth/Throat: Oropharynx is clear and moist. No oropharyngeal exudate.  Eyes: Conjunctivae and EOM are normal. Pupils are equal, round, and reactive to light.  Neck: Normal range of motion. Neck supple.  No meningismus.  Cardiovascular: Normal rate, normal heart sounds and intact distal pulses.   No murmur heard. Irregular bradycardia  Pulmonary/Chest: Effort normal and breath sounds normal. No respiratory distress.  Decreased breath sounds at bases  Abdominal: Soft. There is no tenderness. There is no rebound and no guarding.  Musculoskeletal: Normal range of motion. He exhibits edema. He exhibits no tenderness.  Trace pretibial edema  Neurological: He is alert and oriented to person, place, and time. No cranial nerve deficit. He exhibits normal muscle tone. Coordination normal.  No ataxia on finger to nose bilaterally. No pronator drift. 5/5 strength throughout. CN 2-12 intact.Equal grip strength. Sensation intact.   Skin: Skin is warm.  Psychiatric: He has a normal mood and affect. His behavior is normal.  Nursing note and vitals reviewed.   ED Course  Procedures (including critical care time) Labs Review Labs Reviewed  CBC WITH DIFFERENTIAL/PLATELET - Abnormal; Notable for the following:    RBC 3.52 (*)    Hemoglobin 11.2 (*)    HCT 33.5 (*)    Platelets 130 (*)    Monocytes Absolute 1.3 (*)    All other components within normal limits  COMPREHENSIVE METABOLIC PANEL - Abnormal; Notable for the following:    Sodium 127 (*)    Chloride 77 (*)    CO2 40 (*)    BUN 94 (*)    Creatinine, Ser 2.63 (*)    Albumin 2.8 (*)    AST 192 (*)    ALT 161 (*)    Alkaline Phosphatase 135 (*)    Total Bilirubin 1.3 (*)    GFR calc non Af Amer 20 (*)    GFR calc Af Amer 23 (*)    All other components within normal limits  TROPONIN I - Abnormal; Notable for the following:    Troponin I 0.08  (*)  All other components within normal limits  PROTIME-INR - Abnormal; Notable for the following:    Prothrombin Time 19.6 (*)    INR 1.65 (*)    All other components within normal limits  BRAIN NATRIURETIC PEPTIDE - Abnormal; Notable for the following:    B Natriuretic Peptide 812.2 (*)    All other components within normal limits  DIGOXIN LEVEL - Abnormal; Notable for the following:    Digoxin Level 2.8 (*)    All other components within normal limits  BASIC METABOLIC PANEL - Abnormal; Notable for the following:    Sodium 128 (*)    Chloride 79 (*)    CO2 40 (*)    BUN 90 (*)    Creatinine, Ser 2.53 (*)    Calcium 8.7 (*)    GFR calc non Af Amer 21 (*)    GFR calc Af Amer 25 (*)    All other components within normal limits  DIGOXIN LEVEL - Abnormal; Notable for the following:    Digoxin Level 2.6 (*)    All other components within normal limits  TROPONIN I - Abnormal; Notable for the following:    Troponin I 0.07 (*)    All other components within normal limits  URINALYSIS, ROUTINE W REFLEX MICROSCOPIC (NOT AT [facility])  GLUCOSE, CAPILLARY  TROPONIN I  BASIC METABOLIC PANEL  CBC    Imaging Review Dg Chest 2 View  03/31/2016  CLINICAL DATA:  Patient with history of abnormal chest radiograph. Pleural effusion. EXAM: CHEST  2 VIEW COMPARISON:  Chest radiograph 03/07/2016.  Chest CT 11/03/2015 FINDINGS: There is a retained catheter fragment extending from the main pulmonary artery into the right mid lung. Stable cardiac and mediastinal contours. Unchanged irregular consolidation within the right mid and lower lung with associated pleural effusion. Small pleural effusion. No pneumothorax. Unchanged 8 mm nodular opacity left hemi thorax. IMPRESSION: Patient with persistent heterogeneous opacities right lower lung and associated small effusion, unchanged from prior. Small left pleural effusion. Grossly unchanged nodular density within the left mid lung. Consider follow-up chest  radiograph 4-6. Electronically Signed   By: Lovey Newcomer M.D.   On: 03/31/2016 09:08   I have personally reviewed and evaluated these images and lab results as part of my medical decision-making.   EKG Interpretation   Date/Time:  Friday Apr 01 2016 10:46:44 EDT Ventricular Rate:  52 PR Interval:    QRS Duration: 97 QT Interval:  467 QTC Calculation: 434 R Axis:   -64 Text Interpretation:  Atrial fibrillation Anterior infarct, old  Nonspecific T abnormalities, lateral leads Rate slower Confirmed by  Wyvonnia Dusky  MD, Jensyn Cambria [phone]) on 04/01/2016 11:00:52 AM      MDM   Final diagnoses:  Acute renal failure, unspecified acute renal failure type (HCC)  Digoxin toxicity, accidental or unintentional, initial encounter   Patient sent by PCP for abnormal lab results. Denies any chest pain or shortness of breath.  Heart catheterization December showed nonobstructive disease of the coronaries with EF of 25%.  Labs show hyponatremia, elevated creatinine, elevated digoxin level 2.8. Patient is given gentle hydration. EKG shows slow atrial fibrillation without acute ST changes.  Poison center contacted regarding digoxin toxicity. They recommend 6 hour level. Apply pacer pads to patient. Toxicologist to return call regarding Digibind.  Suspect overdiuresis as cause of patient's presentation.  Discussed with poison center. They do not recommend Digibind at this time given patient's hemodynamic stability. They agree with IV hydration. Recommend recheck digoxin level in 6 hours  from last dose which will be 1:30 PM. Also will recheck electrolytes at this time. CXR today stable.  Gentle hydration given poor EF. Hold diuretics.  Recheck digoxin level pending. Admission d/w Nevin Bloodgood NP.  CRITICAL CARE Performed by: Ezequiel Essex Total critical care time: 45 minutes Critical care time was exclusive of separately billable procedures and treating other patients. Critical care was necessary to treat  or prevent imminent or life-threatening deterioration. Critical care was time spent personally by me on the following activities: development of treatment plan with patient and/or surrogate as well as nursing, discussions with consultants, evaluation of patient's response to treatment, examination of patient, obtaining history from patient or surrogate, ordering and performing treatments and interventions, ordering and review of laboratory studies, ordering and review of radiographic studies, pulse oximetry and re-evaluation of patient's condition.    Ezequiel Essex, MD 04/01/16 [phone]

## 2016-04-01 NOTE — ED Notes (Signed)
Poison control called and informed of Digoxin level 2.6  This was the second draw

## 2016-04-01 NOTE — ED Notes (Signed)
Critical digoxin level of 2.8, provider notified.

## 2016-04-01 NOTE — ED Notes (Signed)
MD at bedside. 

## 2016-04-01 NOTE — H&P (Signed)
History and Physical    [name] [id number] DOB: 1924/12/07 DOA: 04/01/2016  Referring MD/NP/PA:   EDP- Charolotte Capuchin, MD PCP: [name], MD  Outpatient Specialists:  Electrophysiologist - Dr. Caryl Comes. Patient coming from:  [place] Chief Complaint: weakness  HPI: [name] is a 82 y.o. male with multiple medical problems not limited to A_Fib, chronic systolic heart failure, HTN, and diabetes. Patient has been close contact with PCP over the last few weeks for complaints of weight gain and progressive dyspnea. Patient reports that approximately 3 weeks ago his diuretic dose was doubled, he subsequently lost about 14 pounds. After stopping metolazone patient regained a few pounds as well as edema in his legs. Seen by PCP 03/30/16 - heart failure symptoms had improved but patient was found to have multiple electrolyte abnormalities and worsening renal function. Referral to Palliative Medicine made, representative to be at patient's home on Monday. Patient has become progressively weak. No significant shortness of breath when resting or even at night but short of breath with any exertion. He is too weak to ambulate even with his walker  ED Course:  Chest x-ray shows stable right mid and lower lung opacity with small effusion and increasing left base atelectasis  EKG - A. fib, old infarct, nonspecific T-wave abnormalities in lateral leads Sodium, 127 down from 134 on April 10, platelets 1:30, TSH 7.1, digoxin 2.8, troponin 0.08, BNP 812  Review of Systems: As per HPI ,otherwise 10 point review of systems negative.   Past Medical History  Diagnosis Date  . Acid reflux disease     history of  . H/O: GI bleed   . Persistent atrial fibrillation ([place])     a. Failed tikosyn. b. DCCV did not hold 07/2015.  Marland Kitchen Benign prostatic hypertrophy     history of  . Hepatic damage march 2009    retained hepatic stone , intrahepatic duct catheter  . CAD (coronary artery disease)     a.  R/LHC 11/09/15: mild nonobstructive CAD ([facility]) with well- compensated hemodynamics with low filling pressures.  . Hypertension   . 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 Seizures ([place])   . Ventricular tachycardia ? Tikosyn proarrhtyhmia   . Cataracts, bilateral   . Diabetes mellitus     insulin dep  . Pleural effusion 10/2015  . Acute on chronic systolic CHF (congestive heart failure) ([place])   . CKD (chronic kidney disease), stage II   . HCAP (healthcare-associated pneumonia) 12/10/2015  . Hypothyroid   . Pancreatic insufficiency ([place])   . Systolic CHF [facility])     Past Surgical History  Procedure Laterality Date  . Cataract extraction w/ intraocular lens  implant, bilateral Bilateral ~ 2014  . Cholecystectomy    . Partial gastrectomy    . Orif shoulder fracture Left 1941    2 pens inserted  . Whipple procedure      operation  . Tumor excision Right     sarcoma removal from tricep  . Inguinal hernia repair Right X 2  . Appendectomy    . Cardioversion  03/07/2012    Procedure: CARDIOVERSION;  Surgeon: Deboraha Sprang, MD;  Location: [facility];  Service: Cardiovascular;  Laterality: N/A;  . Cardioversion N/A 08/10/2015    Procedure: CARDIOVERSION;  Surgeon: Thayer Headings, MD;  Location: [facility]  ENDOSCOPY;  Service: Cardiovascular;  Laterality: N/A;  . Cardiac catheterization N/A 11/09/2015    Procedure: Right/Left Heart Cath and Coronary Angiography;  Surgeon: Jolaine Artist, MD;  Location: [place] CV LAB;  Service: Cardiovascular;  Laterality: N/A;  . Chest tube insertion Right 12/04/2015    Procedure: INSERTION RIGHT PLEURAL DRAINAGE CATHETER;  Surgeon: Gaye Pollack, MD;  Location: [place] OR;  Service: Thoracic;  Laterality: Right;  . Fracture surgery    . Thoracentesis    . Removal of pleural drainage catheter Right 01/01/2016    Procedure:  REMOVAL OF PLEURAL DRAINAGE CATHETER;  Surgeon: Gaye Pollack, MD;  Location: [place] OR;  Service: Thoracic;  Laterality: Right;     reports that he quit smoking about 25 years ago. His smoking use included Pipe. He has never used smokeless tobacco. He reports that he does not drink alcohol or use illicit drugs.  Allergies  Allergen Reactions  . Lisinopril Cough  . Toujeo Solostar [Insulin Glargine] Other (See Comments)    Shaking, felt bad all over  . Clindamycin Diarrhea  . Other Other (See Comments)    toujeo with perioral tingling only - pt refuses to take further  . Oxycodone-Acetaminophen Diarrhea  . Penicillins Hives    Has patient had a PCN reaction causing immediate rash, facial/tongue/throat swelling, SOB or lightheadedness with hypotension: Yes Has patient had a PCN reaction causing severe rash involving mucus membranes or skin necrosis: no  Has patient had a PCN reaction that required hospitalization No Has patient had a PCN reaction occurring within the last 10 years: No If all of the above answers are "NO", then may proceed with Cephalosporin use.    Family History  Problem Relation Age of Onset  . Coronary artery disease Father 20  . Heart disease Father     fatal MI  . Osteoarthritis Mother 20  . Arthritis Mother   . Coronary artery disease Brother     cabg, avr, pvd with sents  . Heart disease Brother     CABG, PVD  . Peripheral vascular disease Sister 18  . Fibromyalgia Sister   . Arthritis Sister   . Coronary artery disease Sister   . Diabetes Sister   . Cancer Brother      Prior to Admission medications   Medication Sig Start Date End Date Taking? Authorizing Provider  Amino Acids-Protein Hydrolys (FEEDING SUPPLEMENT, PRO-STAT SUGAR FREE 64,) LIQD Take 30 mLs by mouth 2 (two) times daily.    Historical Provider, MD  amiodarone (PACERONE) 200 MG tablet Take 1 tablet (200 mg total) by mouth 2 (two) times daily. 11/27/15   Renee Dyane Dustman, PA-C  apixaban  (ELIQUIS) 2.5 MG TABS tablet Take 1 tablet (2.5 mg total) by mouth 2 (two) times daily. 02/15/16   Renee Dyane Dustman, PA-C  Ascorbic Acid (VITAMIN C) 500 MG tablet Take 500 mg by mouth daily.     Historical Provider, MD  azelastine (ASTELIN) 0.1 % nasal spray Place 2 sprays into both nostrils 2 (two) times daily. Use in each nostril as directed 10/21/15   Janith Lima, MD  digoxin (LANOXIN) 0.125 MG tablet Take 1 tablet (0.125 mg total) by mouth every other day. 03/30/16   Janith Lima, MD  finasteride (PROSCAR) 5 MG tablet Take 1 tablet (5 mg total) by mouth daily. 06/10/15   Janith Lima, MD  glucosamine-chondroitin 500-400 MG tablet Take 1 tablet by mouth daily.    Historical Provider, MD  insulin aspart (  NOVOLOG FLEXPEN) 100 UNIT/ML FlexPen Inject 3-6 Units into the skin See admin instructions. Use 6 units at breakfast, use 3 units at lunch and use 5 units at supper    Historical Provider, MD  levothyroxine (SYNTHROID, LEVOTHROID) 25 MCG tablet Take 1 tablet by mouth  daily before breakfast 03/18/16   Biagio Borg, MD  levothyroxine (SYNTHROID, LEVOTHROID) 50 MCG tablet Take 1 tablet (50 mcg total) by mouth daily. 03/07/16   Janith Lima, MD  lipase/protease/amylase (CREON) 12000 units CPEP capsule Take 1 capsule by mouth 3  times a day before meals 03/07/16   Janith Lima, MD  metolazone (ZAROXOLYN) 5 MG tablet Take 1 tablet (5 mg total) by mouth daily. 03/07/16   Janith Lima, MD  metoprolol tartrate (LOPRESSOR) 25 MG tablet Take 1 tablet (25 mg total) by mouth 2 (two) times daily. 12/01/15   Renee Dyane Dustman, PA-C  Multiple Vitamin (MULTIVITAMIN WITH MINERALS) TABS tablet Take 1 tablet by mouth daily.    Historical Provider, MD  tamsulosin (FLOMAX) 0.4 MG CAPS capsule Take 1 capsule (0.4 mg total) by mouth 2 (two) times daily. 06/10/15   Janith Lima, MD  torsemide [facility]) 10 MG tablet Take 1 tablet by mouth  daily 04/01/16   Janith Lima, MD  torsemide (DEMADEX) 20 MG tablet Take 1 tablet  (20 mg total) by mouth daily. 03/07/16   Janith Lima, MD    Physical Exam: Filed Vitals:   04/01/16 1215 04/01/16 1230 04/01/16 1245 04/01/16 1300  BP: 106/61 100/60 113/63 110/61  Pulse:  48 45 60  Temp:      TempSrc:      Resp: 21 16 32 15  Height:      Weight:      SpO2:  100% 100% 99%      Constitutional: NAD, calm, comfortable Filed Vitals:   04/01/16 1215 04/01/16 1230 04/01/16 1245 04/01/16 1300  BP: 106/61 100/60 113/63 110/61  Pulse:  48 45 60  Temp:      TempSrc:      Resp: 21 16 32 15  Height:      Weight:      SpO2:  100% 100% 99%   Eyes: PER, lids and conjunctivae normal ENMT: Mucous membranes are moist. Posterior pharynx clear of any exudate or lesions.Normal dentition.  Neck: normal, supple, no masses, no thyromegaly Respiratory: Bibasilar crackles , R> L Cardiovascular: irregular rhythm, rate 52, + murmur. BLE pitting edema, 1+ pedal pulses.  Abdomen: no tenderness, no masses palpated. No hepatomegaly. Bowel sounds positive.  Musculoskeletal: no clubbing / cyanosis. No joint deformity upper and lower extremities. Good ROM, no contractures. Normal muscle tone.  Skin: no rashes, lesions, ulcers. Neurologic: CN 2-12 grossly intact. Sensation intact, DTR normal. Strength 5/5 in all 4.  Psychiatric: Normal judgment and insight. Alert and oriented x 3. Normal mood.   Labs on Admission: I have personally reviewed following labs and imaging studies  CBC:  Recent Labs Lab 03/30/16 1643 04/01/16 1055  WBC 8.3 6.6  NEUTROABS 6.7 4.5  HGB 11.9* 11.2*  HCT 35.4* 33.5*  MCV 95.6 95.2  PLT 141.0* [id number]*   Basic Metabolic Panel:  Recent Labs Lab 03/30/16 1643 04/01/16 1055  NA 124* 127*  K 3.8 4.1  CL 76* 77*  CO2 40* 40*  GLUCOSE 117* 77  BUN 92* 94*  CREATININE 2.59* 2.63*  CALCIUM 9.1 8.9   GFR: Estimated Creatinine Clearance: 20 mL/min (by C-G formula based on Cr  of 2.63). Liver Function Tests:  Recent Labs Lab 04/01/16 1055  AST 192*    ALT 161*  ALKPHOS 135*  BILITOT 1.3*  PROT 6.5  ALBUMIN 2.8*    Coagulation Profile:  Recent Labs Lab 04/01/16 1055  INR 1.65*   Cardiac Enzymes:  Recent Labs Lab 04/01/16 1055  TROPONINI 0.08*   BNP (last 3 results)  Recent Labs  11/04/15 1100 01/21/16 1452 03/07/16 1514  PROBNP 504.0* 603.0* 792.0*    Thyroid Function Tests:  Recent Labs  03/30/16 1643  TSH 7.18*    Urine analysis:    Component Value Date/Time   COLORURINE YELLOW 04/01/2016 1100   APPEARANCEUR CLEAR 04/01/2016 1100   LABSPEC 1.014 04/01/2016 1100   PHURINE 6.0 04/01/2016 1100   GLUCOSEU NEGATIVE 04/01/2016 1100   GLUCOSEU NEGATIVE 02/25/2015 1524   HGBUR NEGATIVE 04/01/2016 1100   BILIRUBINUR NEGATIVE 04/01/2016 1100   KETONESUR NEGATIVE 04/01/2016 1100   PROTEINUR NEGATIVE 04/01/2016 1100   UROBILINOGEN 0.2 02/25/2015 1524   NITRITE NEGATIVE 04/01/2016 1100   LEUKOCYTESUR NEGATIVE 04/01/2016 1100    Radiological Exams on Admission: Dg Chest 2 View  04/01/2016  CLINICAL DATA:  Weakness, dehydration, shortness of breath. EXAM: CHEST  2 VIEW COMPARISON:  03/30/2016 FINDINGS: Retained catheter fragment again noted within the right lung, stable. Mild cardiomegaly is stable. Stable consolidation in the right mid and lower lung with small right effusion. Increasing left basilar opacity, likely atelectasis. No acute bony abnormality. IMPRESSION: Stable right mid and lower lung opacity with small right effusion. Increasing left base atelectasis. Electronically Signed   By: Rolm Baptise M.D.   On: 04/01/2016 11:31   Dg Chest 2 View  03/31/2016  CLINICAL DATA:  Patient with history of abnormal chest radiograph. Pleural effusion. EXAM: CHEST  2 VIEW COMPARISON:  Chest radiograph 03/07/2016.  Chest CT 11/03/2015 FINDINGS: There is a retained catheter fragment extending from the main pulmonary artery into the right mid lung. Stable cardiac and mediastinal contours. Unchanged irregular consolidation  within the right mid and lower lung with associated pleural effusion. Small pleural effusion. No pneumothorax. Unchanged 8 mm nodular opacity left hemi thorax. IMPRESSION: Patient with persistent heterogeneous opacities right lower lung and associated small effusion, unchanged from prior. Small left pleural effusion. Grossly unchanged nodular density within the left mid lung. Consider follow-up chest radiograph 4-6. Electronically Signed   By: Lovey Newcomer M.D.   On: 03/31/2016 09:08    EKG: Independently reviewed.  Atrial fibrillation, old infarct, nonspecific T-wave abnormalities in lateral leads. Vrate 52  Assessment/Plan   Weakness, unable to ambulate with walker, recent falls.  Multiple contributing factors (electrolyte disturbances, dehydration, digoxin toxicity, heart failure, deconditioning). Sodium 128 down baseline in  -admit to Medical bed - telemetry. Heart failure order set utilized -Patient will be rehydrated as it appears he has been over diuresed manifested by AKI -Hold diuretics -Electrolyte correction -PT evaluation  Digoxin toxicity, level 2.8. His potassium is normal at 3.8.  Heart rate 52. No visual disturbances, + weakness. EDP spoke with poison control who recommends hydration and repeat digoxin level at 1:30 PM. Digibind not recommended at this time. Pacemaker at bedside.  -hold digoxin  -IV fluids -follow up on repeat digoxin level today  AKI superimposed on CKD stage 2. Cr 2.63, up from baseline of 1.2. Associated electrolyte abnormalities  / metabolic alkalosis. -Hold diuretics -Rehydration. Got 500 cc bolus in ED, maintenance fluids at 100 cc an hour.  -am BMET -Avoid nephrotoxic medications  Atrial fibrillation,  actually bradycardic today - Digoxin level elevated. CHADSVASC 5.  History of retroperitoneal bleed 2015 though currently on Eloquis. Amiodarone stopped secondary to elevated TSH.  -monitor on telemetry -holding digoxin -continue Eliquis,  lopressor  Elevated troponin, 0.8. Probably secondary to AKI.  -nonspecific t wave abnormalities in lateral lead, EKG o/w okay.  -trend troponins -telemetry  Chronic systolic heart failure. EF 20-25% on December 2016 echo. Hx of recurrent right pleural effusions requiring thoracentesis as well as PleurX cath. Today's chest x-ray shows an unchanged irregular consolidation within the right mid and lower lung and small pleural effusion. BNP 812.  -daily weights -I&0 -Unfortunately we need to hold diuretics secondary to renal function  Diabetes, type 2.  -Not eating much per wife. So will hold basal insulin -CBGs, SSI  Hypothyroidism. Slowly improving on Synthroid. On Amiodarone -continue Synthroid  HTN, controlled in ED  Hx of bile duct cancer, s/p remote Whipple.  -continue Creon  Deconditioning. Multiple medical problems. Palliative Medicine to meet with patient and their family at patient's home on Monday.l    DVT prophylaxis:  On Eliquis Code Status:  Full code  Family Communication:  Spoke with wife in room and she understands plan of treatment  Disposition Plan: Back to [facility] in 3-4 days.  Consults called: none Admission status:  Admission   Tye Savoy NP [facility] Pager [phone]  If 7PM-7AM, please contact night-coverage [email or web address] Password [email or web address]  04/01/2016, 1:12 PM

## 2016-04-01 NOTE — ED Notes (Addendum)
Poison control called. 6 hour post ingestion digoxin level recommended.

## 2016-04-01 NOTE — ED Notes (Addendum)
Per EMS, patient coming from [name].   Patient was diagnosed with CHF in December and "hasn't felt good since".  Patient had went to primary doctor on Wednesday and had blood work drawn, he states that MD sent him a message on Thursday through [facility] [place] and told him he may want to come to the ED for "electrolyte imbalance".  Patient states he tripped over something in the bathroom on Tuesday evening.  Patient states he had no injuries from the fall.   Patient states "I sat down hard and couldn't get up".  Patient uses walker at baseline.   Patient has a history of DM, CBG for EMS was 131.  20G LAC was placed by EMS. No meds given. Patient denies chest pain, dizziness.   Patient states he does have SOB all the time since last year.  No changes.

## 2016-04-01 NOTE — ED Notes (Signed)
Critical lab Digoxin 2.6  MD notified

## 2016-04-01 NOTE — ED Notes (Signed)
PACING PADS APPLIED TO PT'S CHEST

## 2016-04-02 LAB — CBC
HCT: 32.5 % — ABNORMAL LOW (ref 39.0–52.0)
HEMOGLOBIN: 10.9 g/dL — AB (ref 13.0–17.0)
MCH: 31.5 pg (ref 26.0–34.0)
MCHC: 33.5 g/dL (ref 30.0–36.0)
MCV: 93.9 fL (ref 78.0–100.0)
Platelets: 130 10*3/uL — ABNORMAL LOW (ref 150–400)
RBC: 3.46 MIL/uL — ABNORMAL LOW (ref 4.22–5.81)
RDW: 14.2 % (ref 11.5–15.5)
WBC: 6.5 10*3/uL (ref 4.0–10.5)

## 2016-04-02 LAB — BASIC METABOLIC PANEL
ANION GAP: 12 (ref 5–15)
BUN: 83 mg/dL — ABNORMAL HIGH (ref 6–20)
CALCIUM: 8.5 mg/dL — AB (ref 8.9–10.3)
CO2: 36 mmol/L — AB (ref 22–32)
CREATININE: 2.41 mg/dL — AB (ref 0.61–1.24)
Chloride: 80 mmol/L — ABNORMAL LOW (ref 101–111)
GFR calc Af Amer: 26 mL/min — ABNORMAL LOW (ref >60)
GFR calc non Af Amer: 22 mL/min — ABNORMAL LOW (ref >60)
GLUCOSE: 155 mg/dL — AB (ref 65–99)
Potassium: 4 mmol/L (ref 3.5–5.1)
Sodium: 128 mmol/L — ABNORMAL LOW (ref 135–145)

## 2016-04-02 LAB — GLUCOSE, CAPILLARY
GLUCOSE-CAPILLARY: 179 mg/dL — AB (ref 65–99)
Glucose-Capillary: 146 mg/dL — ABNORMAL HIGH (ref 65–99)

## 2016-04-02 MED ORDER — SODIUM CHLORIDE 0.9 % IV SOLN
Freq: Once | INTRAVENOUS | Status: AC
Start: 2016-04-02 — End: 2016-04-04
  Administered 2016-04-02: 12:00:00 via INTRAVENOUS

## 2016-04-02 NOTE — Evaluation (Signed)
Physical Therapy Evaluation Patient Details Name: [name] MRN: [id number] DOB: 06-Nov-1925 Today's Date: 04/02/2016   History of Present Illness  Patient is an 82 yo male admitted 04/01/16 with weakness.  Patient with dig toxicity, acute renal failure, bradycardia.   PMH:  Afib, CHF, HTN, DM, CAD, falls  Clinical Impression  Patient presents with problems listed below.  Will benefit from acute PT to maximize functional independence prior to d/c home with wife.  Recommend patient continue HHPT services at d/c.    Follow Up Recommendations Home health PT;Supervision - Intermittent    Equipment Recommendations  None recommended by PT    Recommendations for Other Services       Precautions / Restrictions Precautions Precautions: Fall Restrictions Weight Bearing Restrictions: No      Mobility  Bed Mobility Overal bed mobility: Independent                Transfers Overall transfer level: Needs assistance Equipment used: Rolling walker (2 wheeled) Transfers: Sit to/from Stand Sit to Stand: Min guard         General transfer comment: Patient uses correct hand placement and technique.  Assist for safety/balance.  Ambulation/Gait Ambulation/Gait assistance: Min guard Ambulation Distance (Feet): 64 Feet Assistive device: Rolling walker (2 wheeled) Gait Pattern/deviations: Step-through pattern;Decreased step length - right;Decreased step length - left;Decreased stride length;Trunk flexed Gait velocity: decreased Gait velocity interpretation: Below normal speed for age/gender General Gait Details: Slow, slightly unsteady gait.  Demonstrates safe use of RW.  LE's fatigue quickly.  Stairs            Wheelchair Mobility    Modified Rankin (Stroke Patients Only)       Balance Overall balance assessment: Needs assistance         Standing balance support: Single extremity supported Standing balance-Leahy Scale: Poor                                Pertinent Vitals/Pain Pain Assessment: No/denies pain    Home Living Family/patient expects to be discharged to:: Other (Comment) ([facility]) Living Arrangements: Spouse/significant other Available Help at Discharge: Family;Available 24 hours/day (Wife not able to provide much assist) Type of Home: Independent living facility Home Access: Elevator;Level entry     Home Layout: One level Home Equipment: Walker - 2 wheels;Cane - single point;Bedside commode;Shower seat - built in      Prior Function Level of Independence: Independent with assistive device(s)         Comments: Receiving HHPT pta     Hand Dominance   Dominant Hand: Right    Extremity/Trunk Assessment   Upper Extremity Assessment: Generalized weakness;RUE deficits/detail RUE Deficits / Details: Decreased shoulder ROM - prior fx         Lower Extremity Assessment: Generalized weakness         Communication   Communication: No difficulties  Cognition Arousal/Alertness: Awake/alert Behavior During Therapy: WFL for tasks assessed/performed Overall Cognitive Status: Within Functional Limits for tasks assessed                      General Comments      Exercises        Assessment/Plan    PT Assessment Patient needs continued PT services  PT Diagnosis Difficulty walking;Generalized weakness   PT Problem List Decreased strength;Decreased range of motion;Decreased activity tolerance;Decreased balance;Decreased mobility  PT Treatment Interventions DME instruction;Gait training;Functional mobility  training;Therapeutic activities;Therapeutic exercise;Patient/family education   PT Goals (Current goals can be found in the Care Plan section) Acute Rehab PT Goals Patient Stated Goal: To return home PT Goal Formulation: With patient Time For Goal Achievement: 04/09/16 Potential to Achieve Goals: Good    Frequency Min 3X/week   Barriers to discharge         Co-evaluation               End of Session Equipment Utilized During Treatment: Gait belt Activity Tolerance: Patient tolerated treatment well;Patient limited by fatigue Patient left: in chair;with call bell/phone within reach Nurse Communication: Mobility status         Time: [id number] PT Time Calculation (min) (ACUTE ONLY): 21 min   Charges:   PT Evaluation $PT Eval Moderate Complexity: 1 Procedure     PT G CodesDespina Pole 04-10-2016, 12:46 PM Carita Pian. Sanjuana Kava, [place] Pager [phone]

## 2016-04-02 NOTE — Progress Notes (Signed)
PROGRESS NOTE  [name] [id number] DOB: 06/15/1925 DOA: 04/01/2016 PCP: [name], MD  HPI/Recap of past 24 hours: 36M with Afib, CHF, HTN, DM admitted with weakness, dehydration and AKI after increase in diuresis in recent weeks.    Assessment/Plan: Weakness, unable to ambulate with walker, recent falls. Multiple contributing factors (electrolyte disturbances, dehydration, digoxin toxicity, heart failure, deconditioning). Sodium 128 down baseline in  -admit to Medical bed - telemetry. Heart failure order set utilized -Patient will be rehydrated as it appears he has been over diuresed manifested by AKI -Hold diuretics -Electrolyte correction -PT evaluation - Renal function improving today - cont present management, cont IVF for another 12 hours  Digoxin toxicity, level 2.8. His potassium is normal at 3.8. Heart rate 52. No visual disturbances, + weakness. EDP spoke with poison control who recommends hydration and repeat digoxin level at 1:30 PM. Digibind not recommended at this time. Pacemaker at bedside.  -hold digoxin  -IV fluids -follow up on repeat digoxin level in the AM  AKI superimposed on CKD stage 2. Cr 2.63, up from baseline of 1.2. Associated electrolyte abnormalities / metabolic alkalosis. -Hold diuretics -Rehydration. Got 500 cc bolus in ED, maintenance fluids at 100 cc an hour.  -am BMET -Avoid nephrotoxic medications  Atrial fibrillation, actually bradycardic today - Digoxin level elevated. CHADSVASC 5. History of retroperitoneal bleed 2015 though currently on Eloquis. Amiodarone stopped secondary to elevated TSH.  -monitor on telemetry -holding digoxin -continue Eliquis, lopressor  Elevated troponin, 0.8. Probably secondary to AKI.  -nonspecific t wave abnormalities in lateral lead, EKG o/w okay.  -trend troponins -telemetry  Chronic systolic heart failure. EF 20-25% on December 2016 echo. Hx of recurrent right pleural effusions  requiring thoracentesis as well as PleurX cath. Today's chest x-ray shows an unchanged irregular consolidation within the right mid and lower lung and small pleural effusion. BNP 812.  -daily weights -I&0 -Unfortunately we need to hold diuretics secondary to renal function  Diabetes, type 2.  -Not eating much per wife. So will hold basal insulin -CBGs, SSI  Hypothyroidism. Slowly improving on Synthroid. On Amiodarone -continue Synthroid  HTN, controlled in ED  Hx of bile duct cancer, s/p remote Whipple.  -continue Creon  Deconditioning. Multiple medical problems. Palliative Medicine to meet with patient and their family at patient's home on Monday  Code Status: FULL   Family Communication: None   Disposition Plan: Home with family in 2-3 days.    Consultants:  None   Procedures:  None   Antimicrobials:  None    Objective: Filed Vitals:   04/01/16 2121 04/02/16 0026 04/02/16 0617 04/02/16 0922  BP: 96/49 96/52 97/45  98/57  Pulse: 62 53 57 56  Temp: 97.7 F (36.5 C) 97.8 F (36.6 C) 97.6 F (36.4 C)   TempSrc: Oral Oral Oral   Resp: 20 20 20    Height:      Weight:   74.027 kg (163 lb 3.2 oz)   SpO2: 93% 93% 94% 95%    Intake/Output Summary (Last 24 hours) at 04/02/16 1055 Last data filed at 04/02/16 0346  Gross per 24 hour  Intake    240 ml  Output    675 ml  Net   -435 ml   Filed Weights   04/01/16 1016 04/01/16 1643 04/02/16 0617  Weight: 72.689 kg (160 lb 4 oz) 74.526 kg (164 lb 4.8 oz) 74.027 kg (163 lb 3.2 oz)    Exam: General:  Alert, oriented, calm, in no acute distress, thin  frail caucasian gentleman Eyes: pupils round and reactive to light and accomodation, clear sclerea Neck: supple, no masses, trachea mildline  Cardiovascular: RRR, no murmurs or rubs, no peripheral edema  Respiratory: clear to auscultation bilaterally, no wheezes, no crackles  Abdomen: soft, nontender, nondistended, normal bowel tones heard  Skin: dry, no rashes    Musculoskeletal: no joint effusions, normal range of motion  Psychiatric: appropriate affect, normal speech  Neurologic: extraocular muscles intact, clear speech, moving all extremities with intact sensorium    Data Reviewed: CBC:  Recent Labs Lab 03/30/16 1643 04/01/16 1055 04/02/16 0522  WBC 8.3 6.6 6.5  NEUTROABS 6.7 4.5  --   HGB 11.9* 11.2* 10.9*  HCT 35.4* 33.5* 32.5*  MCV 95.6 95.2 93.9  PLT 141.0* 130* [id number]*   Basic Metabolic Panel:  Recent Labs Lab 03/30/16 1643 04/01/16 1055 04/01/16 1350 04/02/16 0522  NA 124* 127* 128* 128*  K 3.8 4.1 4.3 4.0  CL 76* 77* 79* 80*  CO2 40* 40* 40* 36*  GLUCOSE 117* 77 96 155*  BUN 92* 94* 90* 83*  CREATININE 2.59* 2.63* 2.53* 2.41*  CALCIUM 9.1 8.9 8.7* 8.5*   GFR: Estimated Creatinine Clearance: 22.2 mL/min (by C-G formula based on Cr of 2.41). Liver Function Tests:  Recent Labs Lab 04/01/16 1055  AST 192*  ALT 161*  ALKPHOS 135*  BILITOT 1.3*  PROT 6.5  ALBUMIN 2.8*   No results for input(s): LIPASE, AMYLASE in the last 168 hours. No results for input(s): AMMONIA in the last 168 hours. Coagulation Profile:  Recent Labs Lab 04/01/16 1055  INR 1.65*   Cardiac Enzymes:  Recent Labs Lab 04/01/16 1055 04/01/16 1649 04/01/16 2157  TROPONINI 0.08* 0.07* 0.08*   BNP (last 3 results)  Recent Labs  11/04/15 1100 01/21/16 1452 03/07/16 1514  PROBNP 504.0* 603.0* 792.0*   HbA1C: No results for input(s): HGBA1C in the last 72 hours. CBG:  Recent Labs Lab 04/01/16 1659 04/01/16 2215 04/02/16 0702  GLUCAP 88 199* 146*   Lipid Profile: No results for input(s): CHOL, HDL, LDLCALC, TRIG, CHOLHDL, LDLDIRECT in the last 72 hours. Thyroid Function Tests:  Recent Labs  03/30/16 1643  TSH 7.18*   Anemia Panel: No results for input(s): VITAMINB12, FOLATE, FERRITIN, TIBC, IRON, RETICCTPCT in the last 72 hours. Urine analysis:    Component Value Date/Time   COLORURINE YELLOW 04/01/2016 1100    APPEARANCEUR CLEAR 04/01/2016 1100   LABSPEC 1.014 04/01/2016 1100   PHURINE 6.0 04/01/2016 1100   GLUCOSEU NEGATIVE 04/01/2016 1100   GLUCOSEU NEGATIVE 02/25/2015 1524   HGBUR NEGATIVE 04/01/2016 [place] 04/01/2016 1100   KETONESUR NEGATIVE 04/01/2016 1100   PROTEINUR NEGATIVE 04/01/2016 1100   UROBILINOGEN 0.2 02/25/2015 1524   NITRITE NEGATIVE 04/01/2016 1100   LEUKOCYTESUR NEGATIVE 04/01/2016 1100   Sepsis Labs: @LABRCNTIP (procalcitonin:4,lacticidven:4)  )No results found for this or any previous visit (from the past 240 hour(s)).    Studies: Dg Chest 2 View  04/01/2016  CLINICAL DATA:  Weakness, dehydration, shortness of breath. EXAM: CHEST  2 VIEW COMPARISON:  03/30/2016 FINDINGS: Retained catheter fragment again noted within the right lung, stable. Mild cardiomegaly is stable. Stable consolidation in the right mid and lower lung with small right effusion. Increasing left basilar opacity, likely atelectasis. No acute bony abnormality. IMPRESSION: Stable right mid and lower lung opacity with small right effusion. Increasing left base atelectasis. Electronically Signed   By: Rolm Baptise M.D.   On: 04/01/2016 11:31    Scheduled Meds: .  sodium chloride   Intravenous STAT  . sodium chloride   Intravenous Once  . amiodarone  200 mg Oral BID  . apixaban  2.5 mg Oral BID  . azelastine  2 spray Each Nare BID  . feeding supplement (PRO-STAT SUGAR FREE 64)  30 mL Oral BID  . finasteride  5 mg Oral Daily  . insulin aspart  0-9 Units Subcutaneous TID WC  . levothyroxine  50 mcg Oral QAC breakfast  . lipase/protease/amylase  12,000 Units Oral TID WC  . metoprolol tartrate  25 mg Oral BID  . sodium chloride flush  3 mL Intravenous Q12H  . tamsulosin  0.4 mg Oral BID  . vitamin C  500 mg Oral Daily    Continuous Infusions:    LOS: 1 day   Time spent: [place], MD [facility] Pager [phone]  If 7PM-7AM, please contact  night-coverage [email or web address] Password [email or web address] 04/02/2016, 10:55 AM

## 2016-04-03 DIAGNOSIS — N179 Acute kidney failure, unspecified: Secondary | ICD-10-CM | POA: Insufficient documentation

## 2016-04-03 LAB — BASIC METABOLIC PANEL
Anion gap: 9 (ref 5–15)
BUN: 67 mg/dL — AB (ref 6–20)
CHLORIDE: 87 mmol/L — AB (ref 101–111)
CO2: 36 mmol/L — ABNORMAL HIGH (ref 22–32)
CREATININE: 2 mg/dL — AB (ref 0.61–1.24)
Calcium: 8.3 mg/dL — ABNORMAL LOW (ref 8.9–10.3)
GFR calc Af Amer: 33 mL/min — ABNORMAL LOW (ref >60)
GFR, EST NON AFRICAN AMERICAN: 28 mL/min — AB (ref >60)
Glucose, Bld: 155 mg/dL — ABNORMAL HIGH (ref 65–99)
Potassium: 3.8 mmol/L (ref 3.5–5.1)
SODIUM: 132 mmol/L — AB (ref 135–145)

## 2016-04-03 LAB — GLUCOSE, CAPILLARY
GLUCOSE-CAPILLARY: 164 mg/dL — AB (ref 65–99)
Glucose-Capillary: 144 mg/dL — ABNORMAL HIGH (ref 65–99)
Glucose-Capillary: 136 mg/dL — ABNORMAL HIGH (ref 65–99)
Glucose-Capillary: 129 mg/dL — ABNORMAL HIGH (ref 65–99)
Glucose-Capillary: 154 mg/dL — ABNORMAL HIGH (ref 65–99)

## 2016-04-03 LAB — DIGOXIN LEVEL: DIGOXIN LVL: 1.7 ng/mL (ref 0.8–2.0)

## 2016-04-03 MED ORDER — SODIUM CHLORIDE 0.9 % IV SOLN
Freq: Once | INTRAVENOUS | Status: AC
  Administered 2016-04-03: 10:00:00 via INTRAVENOUS

## 2016-04-03 NOTE — Progress Notes (Addendum)
Pharmacist Heart Failure Core Measure Documentation  Assessment: KWANZA BRISKI has an EF documented as 25-30% on 11/05/2015 by echo.  Rationale: Heart failure patients with left ventricular systolic dysfunction (LVSD) and an EF < 40% should be prescribed an angiotensin converting enzyme inhibitor (ACEI) or angiotensin receptor blocker (ARB) at discharge unless a contraindication is documented in the medical record.  This patient is not currently on an ACEI or ARB for HF.  This note is being placed in the record in order to provide documentation that a contraindication to the use of these agents is present for this encounter.  ACE Inhibitor or Angiotensin Receptor Blocker is contraindicated (specify all that apply)  [x]   ACEI allergy AND ARB allergy []   Angioedema []   Moderate or severe aortic stenosis []   Hyperkalemia []   Hypotension []   Renal artery stenosis [x]   Worsening renal function, preexisting renal disease or dysfunction   Cassie L. Nicole Kindred, PharmD PGY2 Infectious Diseases Pharmacy Resident Pager: [phone] 04/03/2016 1:29 PM

## 2016-04-03 NOTE — Progress Notes (Signed)
PROGRESS NOTE  [name] [id number] DOB: 04-02-1925 DOA: 04/01/2016 PCP: [name], MD  HPI/Recap of past 24 hours: 20M with Afib, CHF, HTN, DM admitted with weakness, dehydration and AKI after increase in diuresis in recent weeks. Now doing much better with hydration, Cr improving.    Assessment/Plan: Weakness, unable to ambulate with walker, recent falls. Multiple contributing factors (electrolyte disturbances, dehydration, digoxin toxicity, heart failure, deconditioning). Sodium 128 down baseline in  -admit to Medical bed - telemetry. Heart failure order set utilized -Patient will be rehydrated as it appears he has been over diuresed manifested by AKI -Hold diuretics -Electrolyte correction -PT evaluation recommends no PT needs - Renal function improving, but not yet at goal - cont present management, cont slow IVF for another 12 hours - if renal function close to baseline Cr of 1.4 in the AM, can likely d/c home  Digoxin toxicity, level 2.8. His potassium is normal at 3.8. Heart rate 52. No visual disturbances, + weakness. EDP spoke with poison control who recommends hydration and repeat digoxin level at 1:30 PM. Digibind not recommended at this time. Pacemaker at bedside.  -hold digoxin  -IV fluids -dig level normal now, will hold for improved renal function  AKI superimposed on CKD stage 2. Cr 2.63, up from baseline of 1.2. Associated electrolyte abnormalities / metabolic alkalosis. -Hold diuretics -Rehydration. Got 500 cc bolus in ED, maintenance fluids at 50 cc an hour today -Avoid nephrotoxic medications  Atrial fibrillation, actually bradycardic today - Digoxin level elevated. CHADSVASC 5. History of retroperitoneal bleed 2015 though currently on Eloquis. Amiodarone stopped secondary to elevated TSH.  -monitor on telemetry -holding digoxin -continue Eliquis, lopressor  Elevated troponin, 0.8. Probably secondary to AKI.  -nonspecific t wave  abnormalities in lateral lead, EKG o/w okay.  -trend troponins -telemetry  Chronic systolic heart failure. EF 20-25% on December 2016 echo. Hx of recurrent right pleural effusions requiring thoracentesis as well as PleurX cath. Today's chest x-ray shows an unchanged irregular consolidation within the right mid and lower lung and small pleural effusion. BNP 812.  -daily weights -I&0 -Unfortunately we need to hold diuretics secondary to renal function  Diabetes, type 2.  -Not eating much per wife. So will hold basal insulin -CBGs, SSI  Hypothyroidism. Slowly improving on Synthroid. On Amiodarone -continue Synthroid  HTN, controlled in ED  Hx of bile duct cancer, s/p remote Whipple.  -continue Creon  Deconditioning. Multiple medical problems. Palliative Medicine to meet with patient and their family at patient's home on Monday  Code Status: FULL   Family Communication: None   Disposition Plan: Home in AM.    Consultants:  None   Procedures:  None   Antimicrobials:  None    Objective: Filed Vitals:   04/02/16 0922 04/02/16 1225 04/02/16 2032 04/03/16 0701  BP: 98/57 110/61 103/47 98/62  Pulse: 56 70 61 85  Temp:  97.7 F (36.5 C) 97.7 F (36.5 C) 97.9 F (36.6 C)  TempSrc:  Oral Oral Oral  Resp:  18 18 18   Height:      Weight:    73.982 kg (163 lb 1.6 oz)  SpO2: 95% 92% 97% 90%    Intake/Output Summary (Last 24 hours) at 04/03/16 0913 Last data filed at 04/03/16 0541  Gross per 24 hour  Intake    400 ml  Output   1150 ml  Net   -750 ml   Filed Weights   04/01/16 1643 04/02/16 0617 04/03/16 0701  Weight: 74.526 kg (164  lb 4.8 oz) 74.027 kg (163 lb 3.2 oz) 73.982 kg (163 lb 1.6 oz)    Exam: General:  Alert, oriented, calm, in no acute distress, thin frail caucasian gentleman Eyes: pupils round and reactive to light and accomodation, clear sclerea Neck: supple, no masses, trachea mildline  Cardiovascular: RRR, no murmurs or rubs, no peripheral  edema  Respiratory: minimal crackles forming in R>L base, no wheezing  Abdomen: soft, nontender, nondistended, normal bowel tones heard  Skin: dry, no rashes  Musculoskeletal: no joint effusions, normal range of motion  Psychiatric: appropriate affect, normal speech  Neurologic: extraocular muscles intact, clear speech, moving all extremities with intact sensorium    Data Reviewed: CBC:  Recent Labs Lab 03/30/16 1643 04/01/16 1055 04/02/16 0522  WBC 8.3 6.6 6.5  NEUTROABS 6.7 4.5  --   HGB 11.9* 11.2* 10.9*  HCT 35.4* 33.5* 32.5*  MCV 95.6 95.2 93.9  PLT 141.0* 130* [id number]*   Basic Metabolic Panel:  Recent Labs Lab 03/30/16 1643 04/01/16 1055 04/01/16 1350 04/02/16 0522 04/03/16 0335  NA 124* 127* 128* 128* 132*  K 3.8 4.1 4.3 4.0 3.8  CL 76* 77* 79* 80* 87*  CO2 40* 40* 40* 36* 36*  GLUCOSE 117* 77 96 155* 155*  BUN 92* 94* 90* 83* 67*  CREATININE 2.59* 2.63* 2.53* 2.41* 2.00*  CALCIUM 9.1 8.9 8.7* 8.5* 8.3*   GFR: Estimated Creatinine Clearance: 26.7 mL/min (by C-G formula based on Cr of 2). Liver Function Tests:  Recent Labs Lab 04/01/16 1055  AST 192*  ALT 161*  ALKPHOS 135*  BILITOT 1.3*  PROT 6.5  ALBUMIN 2.8*   No results for input(s): LIPASE, AMYLASE in the last 168 hours. No results for input(s): AMMONIA in the last 168 hours. Coagulation Profile:  Recent Labs Lab 04/01/16 1055  INR 1.65*   Cardiac Enzymes:  Recent Labs Lab 04/01/16 1055 04/01/16 1649 04/01/16 2157  TROPONINI 0.08* 0.07* 0.08*   BNP (last 3 results)  Recent Labs  11/04/15 1100 01/21/16 1452 03/07/16 1514  PROBNP 504.0* 603.0* 792.0*   HbA1C: No results for input(s): HGBA1C in the last 72 hours. CBG:  Recent Labs Lab 04/01/16 2215 04/02/16 0702 04/02/16 1117 04/02/16 2328 04/03/16 0700  GLUCAP 199* 146* 179* 144* 136*   Lipid Profile: No results for input(s): CHOL, HDL, LDLCALC, TRIG, CHOLHDL, LDLDIRECT in the last 72 hours. Thyroid Function  Tests: No results for input(s): TSH, T4TOTAL, FREET4, T3FREE, THYROIDAB in the last 72 hours. Anemia Panel: No results for input(s): VITAMINB12, FOLATE, FERRITIN, TIBC, IRON, RETICCTPCT in the last 72 hours. Urine analysis:    Component Value Date/Time   COLORURINE YELLOW 04/01/2016 1100   APPEARANCEUR CLEAR 04/01/2016 1100   LABSPEC 1.014 04/01/2016 1100   PHURINE 6.0 04/01/2016 1100   GLUCOSEU NEGATIVE 04/01/2016 1100   GLUCOSEU NEGATIVE 02/25/2015 1524   HGBUR NEGATIVE 04/01/2016 [place] 04/01/2016 1100   KETONESUR NEGATIVE 04/01/2016 1100   PROTEINUR NEGATIVE 04/01/2016 1100   UROBILINOGEN 0.2 02/25/2015 1524   NITRITE NEGATIVE 04/01/2016 1100   LEUKOCYTESUR NEGATIVE 04/01/2016 1100   Sepsis Labs: @LABRCNTIP (procalcitonin:4,lacticidven:4)  )No results found for this or any previous visit (from the past 240 hour(s)).    Studies: No results found.  Scheduled Meds: . sodium chloride   Intravenous Once  . amiodarone  200 mg Oral BID  . apixaban  2.5 mg Oral BID  . azelastine  2 spray Each Nare BID  . feeding supplement (PRO-STAT SUGAR FREE 64)  30 mL  Oral BID  . finasteride  5 mg Oral Daily  . insulin aspart  0-9 Units Subcutaneous TID WC  . levothyroxine  50 mcg Oral QAC breakfast  . lipase/protease/amylase  12,000 Units Oral TID WC  . metoprolol tartrate  25 mg Oral BID  . sodium chloride flush  3 mL Intravenous Q12H  . tamsulosin  0.4 mg Oral BID  . vitamin C  500 mg Oral Daily    Continuous Infusions:    LOS: 2 days   Time spent: [place], MD [facility] Pager [phone]  If 7PM-7AM, please contact night-coverage [email or web address] Password [facility] 04/03/2016, 9:13 AM

## 2016-04-04 ENCOUNTER — Ambulatory Visit (INDEPENDENT_AMBULATORY_CARE_PROVIDER_SITE_OTHER): Payer: Medicare Other | Admitting: Internal Medicine

## 2016-04-04 DIAGNOSIS — I4819 Other persistent atrial fibrillation: Secondary | ICD-10-CM

## 2016-04-04 DIAGNOSIS — I481 Persistent atrial fibrillation: Secondary | ICD-10-CM

## 2016-04-04 DIAGNOSIS — T460X1D Poisoning by cardiac-stimulant glycosides and drugs of similar action, accidental (unintentional), subsequent encounter: Secondary | ICD-10-CM

## 2016-04-04 DIAGNOSIS — I5022 Chronic systolic (congestive) heart failure: Secondary | ICD-10-CM

## 2016-04-04 DIAGNOSIS — Z006 Encounter for examination for normal comparison and control in clinical research program: Secondary | ICD-10-CM

## 2016-04-04 LAB — GLUCOSE, CAPILLARY
GLUCOSE-CAPILLARY: 159 mg/dL — AB (ref 65–99)
Glucose-Capillary: 134 mg/dL — ABNORMAL HIGH (ref 65–99)
Glucose-Capillary: 189 mg/dL — ABNORMAL HIGH (ref 65–99)
Glucose-Capillary: 170 mg/dL — ABNORMAL HIGH (ref 65–99)
Glucose-Capillary: 151 mg/dL — ABNORMAL HIGH (ref 65–99)

## 2016-04-04 LAB — BASIC METABOLIC PANEL
ANION GAP: 9 (ref 5–15)
BUN: 54 mg/dL — ABNORMAL HIGH (ref 6–20)
CHLORIDE: 92 mmol/L — AB (ref 101–111)
CO2: 36 mmol/L — AB (ref 22–32)
Calcium: 8.3 mg/dL — ABNORMAL LOW (ref 8.9–10.3)
Creatinine, Ser: 1.75 mg/dL — ABNORMAL HIGH (ref 0.61–1.24)
GFR calc Af Amer: 38 mL/min — ABNORMAL LOW (ref >60)
GFR calc non Af Amer: 33 mL/min — ABNORMAL LOW (ref >60)
GLUCOSE: 146 mg/dL — AB (ref 65–99)
POTASSIUM: 3.6 mmol/L (ref 3.5–5.1)
Sodium: 137 mmol/L (ref 135–145)

## 2016-04-04 NOTE — Progress Notes (Signed)
Protocol Title: Apixaban Validation Study (ClinicalTrials.gov Identifier: [id number]) RESEARCH SUBJECT. Purpose: Obtain fresh samples that will be used to assess the performances of STA-Apixaban Calibrator and STA-Apixaban Control in combination with the STA-Liquid Anti-Xa to determine the quantity of apixaban in plasma samples by measurement of its direct anti-Xa activity. Apixaban Validation Study is sponsored by [employer].The fresh samples will collected by completing a one time blood draw on approved patients. Local PI: Dr Ulice Dash Gr  Inclusion Criteria: weight </= 60kg, >/= 75 years, Hct <39% for male, < 36% for male, renal impairment, co-medication with ASA/NSAIDs, and/or co-medication with anti-platelet agents.    Apixaban Validation Study Informed Consent   Subject Name: [name]  This patient, [name], has been consented to the above clinical trial according to FDA regulations, GCP guidelines and [facility]'s SOPs. The informed consent form and study design have been explained to this patient by this study coordinator. The patient demonstrated comprehension of this clinical trial and study requirements/expectations. No study procedures have been initiated before consenting of this patient. The patient was given sufficient time for reading the consent form. All risks, benefits and options have been thoroughly discussed and all questions were answered per the patient's satisfaction. This patient was not coerced in any way to participate in this clinical trial. This patient has voluntarily signed consent version 1.0 at 10:30am on 04/04/2016. A copy of the signed consent form was given to the patient and a copy was placed in the subject's medical record. Patient's grandson was present at time of consent.   [place], CMA REsearch asst, [facility] Office [phone]   Sub-I Oversight note  I reviewed this research intervention as sub-I and agree with  iut  Dr. Brand Males, M.D., [facility].C.P Pulmonary and Critical Care Medicine Staff Physician [facility] Pager: [phone], If no answer or between  15:00h - 7:00h: call [phone]  04/05/2016 6:31 PM

## 2016-04-04 NOTE — Consult Note (Signed)
   [facility] CM Inpatient Consult   04/04/2016  JODI KAPPES 12-25-1924 [id number]   Referral received to assess for care management services.    Met with the patient regarding the benefits of [facility] services.  Patient has been admitted 4 times in the past 6 months.  Explained that [facility] is a covered benefit of Medicare insurance. Review information for [facility] and a brochure was provided with contact information.  Explained that [facility] does not interfere with or replace any services arranged by the inpatient care management staff.  Patient declined services with [facility], "my daughter has spoken to someone about therapy coming and I am at [facility] and I don't think I will need anything else."  Brochure given and encouraged him to review the information with his daughter and call at anytime they have care management needs. Chart review reveals the patient has been assigned an EMMI program with the inpatient RNCM.   For questions or changes, please contact:  Natividad Brood, RN BSN [facility] Liaison  [phone] business mobile phone Toll free office [phone]

## 2016-04-04 NOTE — Progress Notes (Signed)
Chaplain support The [facility]. Via prayer.

## 2016-04-04 NOTE — Progress Notes (Signed)
PROGRESS NOTE  [name] [id number] DOB: 09-Apr-1925 DOA: 04/01/2016 PCP: [name], MD  HPI/Recap of past 24 hours: 61M with Afib, CHF, HTN, DM admitted with weakness, dehydration and AKI after increase in diuresis in recent weeks. Now doing much better with hydration, Cr improving.    Assessment/Plan: Weakness, unable to ambulate with walker, recent falls. Multiple contributing factors (electrolyte disturbances, dehydration, digoxin toxicity, heart failure, deconditioning).  Admitted to telemetry,  -Patient was rehydrated as it appears he has been over diuresed manifested by AKI -Holding diuretics -Electrolyte correction -PT evaluation recommends no PT needs - Renal function improving, but not yet at goal - cont present management, - if renal function close to baseline Cr of 1.4 in the AM, can likely d/c home  Digoxin toxicity, resolved. -dig level normal now, will hold for improved renal function  AKI superimposed on CKD stage 2. Cr 2.63, up from baseline of 1.2. Associated electrolyte abnormalities / metabolic alkalosis. Holding diuretics and nephrotoxins.  Monitor for 24hours more and discharge in am .   Atrial fibrillation, rate controlled.  CHADSVASC 5. History of retroperitoneal bleed 2015 though currently on Eliquis. Amiodarone stopped secondary to elevated TSH.  -monitor on telemetry -holding digoxin -continue Eliquis, lopressor  Elevated troponin, 0.8. Probably secondary to AKI.  -nonspecific t wave abnormalities in lateral lead, EKG o/w okay.    Chronic systolic heart failure. EF 20-25% on December 2016 echo. Hx of recurrent right pleural effusions requiring thoracentesis as well as PleurX cath. chest x-ray shows an unchanged irregular consolidation within the right mid and lower lung and small pleural effusion. BNP 812.  -daily weights -I&0 -Unfortunately we need to hold diuretics secondary to renal function.  Diabetes, type 2.  -Not eating  much per wife. So will hold basal insulin. - CBG (last 3)   Recent Labs  04/04/16 0559 04/04/16 1109 04/04/16 1628  GLUCAP 134* 189* 170*    Resume ssi.   Hypothyroidism. Slowly improving on Synthroid. On Amiodarone -continue Synthroid  HTN, controlled in ED  Hx of bile duct cancer, s/p remote Whipple.  -continue Creon  Deconditioning. Multiple medical problems. Palliative Medicine to meet with patient and their family at patient's home on Monday  Elevated liver function tests: Repeat in am and monitor.   Code Status: FULL   Family Communication: None   Disposition Plan: Home in AM.    Consultants:  None   Procedures:  None   Antimicrobials:  None    Objective: Filed Vitals:   04/03/16 2202 04/04/16 0553 04/04/16 0825 04/04/16 1022  BP: 96/41 108/56  106/50  Pulse: 69 69 63 75  Temp: 98.1 F (36.7 C) 98 F (36.7 C)  97.4 F (36.3 C)  TempSrc: Oral Oral  Oral  Resp: 18 18  18   Height:      Weight:  74.435 kg (164 lb 1.6 oz)    SpO2: 92% 92%  96%    Intake/Output Summary (Last 24 hours) at 04/04/16 1814 Last data filed at 04/04/16 1405  Gross per 24 hour  Intake    723 ml  Output    600 ml  Net    123 ml   Filed Weights   04/02/16 0617 04/03/16 0701 04/04/16 0553  Weight: 74.027 kg (163 lb 3.2 oz) 73.982 kg (163 lb 1.6 oz) 74.435 kg (164 lb 1.6 oz)    Exam: General:  Alert, oriented, calm, in no acute distress, thin frail caucasian gentleman Cardiovascular: RRR, no murmurs or rubs, no peripheral  edema  Respiratory: minimal crackles forming in R>L base, no wheezing  Abdomen: soft, nontender, nondistended, normal bowel tones heard  Skin: dry, no rashes  Musculoskeletal: no joint effusions, normal range of motion  Psychiatric: appropriate affect, normal speech     Data Reviewed: CBC:  Recent Labs Lab 03/30/16 1643 04/01/16 1055 04/02/16 0522  WBC 8.3 6.6 6.5  NEUTROABS 6.7 4.5  --   HGB 11.9* 11.2* 10.9*  HCT 35.4* 33.5*  32.5*  MCV 95.6 95.2 93.9  PLT 141.0* 130* [id number]*   Basic Metabolic Panel:  Recent Labs Lab 04/01/16 1055 04/01/16 1350 04/02/16 0522 04/03/16 0335 04/04/16 0336  NA 127* 128* 128* 132* 137  K 4.1 4.3 4.0 3.8 3.6  CL 77* 79* 80* 87* 92*  CO2 40* 40* 36* 36* 36*  GLUCOSE 77 96 155* 155* 146*  BUN 94* 90* 83* 67* 54*  CREATININE 2.63* 2.53* 2.41* 2.00* 1.75*  CALCIUM 8.9 8.7* 8.5* 8.3* 8.3*   GFR: Estimated Creatinine Clearance: 30.7 mL/min (by C-G formula based on Cr of 1.75). Liver Function Tests:  Recent Labs Lab 04/01/16 1055  AST 192*  ALT 161*  ALKPHOS 135*  BILITOT 1.3*  PROT 6.5  ALBUMIN 2.8*   No results for input(s): LIPASE, AMYLASE in the last 168 hours. No results for input(s): AMMONIA in the last 168 hours. Coagulation Profile:  Recent Labs Lab 04/01/16 1055  INR 1.65*   Cardiac Enzymes:  Recent Labs Lab 04/01/16 1055 04/01/16 1649 04/01/16 2157  TROPONINI 0.08* 0.07* 0.08*   BNP (last 3 results)  Recent Labs  11/04/15 1100 01/21/16 1452 03/07/16 1514  PROBNP 504.0* 603.0* 792.0*   HbA1C: No results for input(s): HGBA1C in the last 72 hours. CBG:  Recent Labs Lab 04/03/16 1603 04/03/16 2125 04/04/16 0559 04/04/16 1109 04/04/16 1628  GLUCAP 164* 154* 134* 189* 170*   Lipid Profile: No results for input(s): CHOL, HDL, LDLCALC, TRIG, CHOLHDL, LDLDIRECT in the last 72 hours. Thyroid Function Tests: No results for input(s): TSH, T4TOTAL, FREET4, T3FREE, THYROIDAB in the last 72 hours. Anemia Panel: No results for input(s): VITAMINB12, FOLATE, FERRITIN, TIBC, IRON, RETICCTPCT in the last 72 hours. Urine analysis:    Component Value Date/Time   COLORURINE YELLOW 04/01/2016 1100   APPEARANCEUR CLEAR 04/01/2016 1100   LABSPEC 1.014 04/01/2016 1100   PHURINE 6.0 04/01/2016 1100   GLUCOSEU NEGATIVE 04/01/2016 1100   GLUCOSEU NEGATIVE 02/25/2015 1524   HGBUR NEGATIVE 04/01/2016 [place] 04/01/2016 1100    KETONESUR NEGATIVE 04/01/2016 1100   PROTEINUR NEGATIVE 04/01/2016 1100   UROBILINOGEN 0.2 02/25/2015 1524   NITRITE NEGATIVE 04/01/2016 1100   LEUKOCYTESUR NEGATIVE 04/01/2016 1100   Sepsis Labs: @LABRCNTIP (procalcitonin:4,lacticidven:4)  )No results found for this or any previous visit (from the past 240 hour(s)).    Studies: No results found.  Scheduled Meds: . amiodarone  200 mg Oral BID  . apixaban  2.5 mg Oral BID  . azelastine  2 spray Each Nare BID  . feeding supplement (PRO-STAT SUGAR FREE 64)  30 mL Oral BID  . finasteride  5 mg Oral Daily  . insulin aspart  0-9 Units Subcutaneous TID WC  . levothyroxine  50 mcg Oral QAC breakfast  . lipase/protease/amylase  12,000 Units Oral TID WC  . metoprolol tartrate  25 mg Oral BID  . sodium chloride flush  3 mL Intravenous Q12H  . tamsulosin  0.4 mg Oral BID  . vitamin C  500 mg Oral Daily    Continuous  Infusions:    LOS: 3 days   Time spent: 25 minutes.  Hosie Poisson, MD [facility] Pager [phone]  If 7PM-7AM, please contact night-coverage [email or web address] Password [facility] 04/04/2016, 6:14 PM

## 2016-04-04 NOTE — Progress Notes (Signed)
Physical Therapy Treatment Patient Details Name: AIMEE KOEPPE MRN: [id number] DOB: January 27, 1925 Today's Date: 04/04/2016    History of Present Illness Patient is an 82 yo male admitted 04/01/16 with weakness.  Patient with dig toxicity, acute renal failure, bradycardia.   PMH:  Afib, CHF, HTN, DM, CAD, falls    PT Comments    Patient discouraged by degree of weakness he is experiencing after hard work with [name] (ie. PTA was able to do sit to stand x 6 in 10 seconds; today could only do once). Eager to regain his strength and reports he has been doing bed exercises throoughout the day.   Follow Up Recommendations  Home health PT;Supervision - Intermittent     Equipment Recommendations  None recommended by PT    Recommendations for Other Services       Precautions / Restrictions Precautions Precautions: Fall Restrictions Weight Bearing Restrictions: No    Mobility  Bed Mobility Overal bed mobility: Modified Independent             General bed mobility comments: incr effort and time  Transfers Overall transfer level: Needs assistance Equipment used: Rolling walker (2 wheeled) Transfers: Sit to/from Stand Sit to Stand: Min guard;Min assist         General transfer comment: x 7; no assist from bed, required physical assist from lower recliner (even with use Lt armrest); as he fatigued, he began to use posterior lower leg against chair for "leverage" and required vc to avoid this habit  Ambulation/Gait Ambulation/Gait assistance: Min guard Ambulation Distance (Feet): 110 Feet Assistive device: Rolling walker (2 wheeled) Gait Pattern/deviations: Step-through pattern;Decreased stride length;Trunk flexed Gait velocity: decreased Gait velocity interpretation: Below normal speed for age/gender General Gait Details: Vc for proximity to RW and to look forward, not downward   Stairs            Wheelchair Mobility    Modified Rankin (Stroke Patients Only)       Balance           Standing balance support: Single extremity supported Standing balance-Leahy Scale: Poor                      Cognition Arousal/Alertness: Awake/alert Behavior During Therapy: WFL for tasks assessed/performed Overall Cognitive Status: Within Functional Limits for tasks assessed                      Exercises Other Exercises Other Exercises: sit to stand x 7     General Comments        Pertinent Vitals/Pain Pain Assessment: No/denies pain    Home Living                      Prior Function            PT Goals (current goals can now be found in the care plan section) Acute Rehab PT Goals Patient Stated Goal: To return home Time For Goal Achievement: 04/09/16 Progress towards PT goals: Progressing toward goals    Frequency  Min 3X/week    PT Plan Current plan remains appropriate    Co-evaluation             End of Session Equipment Utilized During Treatment: Gait belt Activity Tolerance: Patient tolerated treatment well Patient left: in chair;with call bell/phone within reach     Time: [id number] PT Time Calculation (min) (ACUTE ONLY): 29 min  Charges:  $Gait Training: 8-22 mins $Therapeutic  Exercise: 8-22 mins                    G Codes:      Jisell Majer 04/21/2016, 2:43 PM Pager [phone]

## 2016-04-05 DIAGNOSIS — E869 Volume depletion, unspecified: Secondary | ICD-10-CM

## 2016-04-05 DIAGNOSIS — N189 Chronic kidney disease, unspecified: Secondary | ICD-10-CM

## 2016-04-05 LAB — COMPREHENSIVE METABOLIC PANEL
ALT: 114 U/L — ABNORMAL HIGH (ref 17–63)
ANION GAP: 12 (ref 5–15)
AST: 110 U/L — ABNORMAL HIGH (ref 15–41)
Albumin: 2.3 g/dL — ABNORMAL LOW (ref 3.5–5.0)
Alkaline Phosphatase: 125 U/L (ref 38–126)
BILIRUBIN TOTAL: 1.8 mg/dL — AB (ref 0.3–1.2)
BUN: 41 mg/dL — ABNORMAL HIGH (ref 6–20)
CO2: 33 mmol/L — ABNORMAL HIGH (ref 22–32)
Calcium: 8.1 mg/dL — ABNORMAL LOW (ref 8.9–10.3)
Chloride: 91 mmol/L — ABNORMAL LOW (ref 101–111)
Creatinine, Ser: 1.51 mg/dL — ABNORMAL HIGH (ref 0.61–1.24)
GFR calc non Af Amer: 39 mL/min — ABNORMAL LOW (ref >60)
GFR, EST AFRICAN AMERICAN: 46 mL/min — AB (ref >60)
Glucose, Bld: 142 mg/dL — ABNORMAL HIGH (ref 65–99)
Potassium: 3.6 mmol/L (ref 3.5–5.1)
Sodium: 136 mmol/L (ref 135–145)
TOTAL PROTEIN: 5.7 g/dL — AB (ref 6.5–8.1)

## 2016-04-05 LAB — CBC
HCT: 33 % — ABNORMAL LOW (ref 39.0–52.0)
HEMOGLOBIN: 10.5 g/dL — AB (ref 13.0–17.0)
MCH: 31 pg (ref 26.0–34.0)
MCHC: 31.8 g/dL (ref 30.0–36.0)
MCV: 97.3 fL (ref 78.0–100.0)
PLATELETS: 123 10*3/uL — AB (ref 150–400)
RBC: 3.39 MIL/uL — AB (ref 4.22–5.81)
RDW: 15 % (ref 11.5–15.5)
WBC: 6.8 10*3/uL (ref 4.0–10.5)

## 2016-04-05 LAB — GLUCOSE, CAPILLARY
GLUCOSE-CAPILLARY: 132 mg/dL — AB (ref 65–99)
GLUCOSE-CAPILLARY: 161 mg/dL — AB (ref 65–99)

## 2016-04-05 MED ORDER — DIGOXIN 125 MCG PO TABS: 0.0625 mg | ORAL_TABLET | ORAL | Status: AC

## 2016-04-05 MED ORDER — TORSEMIDE 5 MG PO TABS: 20.0000 mg | ORAL_TABLET | Freq: Every day | ORAL | Status: DC

## 2016-04-05 NOTE — Clinical Social Work Note (Signed)
Clinical Social Work Assessment  Patient Details  Name: [name] MRN: [id number] Date of Birth: 02-21-1925  Date of referral:  04/05/16               Reason for consult:  Facility Placement, Discharge Planning                Permission sought to share information with:  Facility Sport and exercise psychologist, Family Supports Permission granted to share information::  Yes, Verbal Permission Granted  Name::     Dmitriy Zdanowicz  Agency::  [facility]  Relationship::  Wife  Contact Information:  [phone]  Housing/Transportation Living arrangements for the past 2 months:  [place] of Information:  Medical Team, Spouse Patient Interpreter Needed:  None Criminal Activity/Legal Involvement Pertinent to Current Situation/Hospitalization:  No - Comment as needed Significant Relationships:  Spouse, Adult Children Lives with:  Facility Resident Do you feel safe going back to the place where you live?  Yes Need for family participation in patient care:  Yes (Comment)  Care giving concerns:  Patient will return to ALF. PT recommends home health PT.   Social Worker assessment / plan:  CSW called patient's wife due to having legal guardian. CSW introduced role and explained that patient would discharge back to [facility] today. Her son-in-law is on the way to see patient and will transport patient back to ALF. No further concerns. CSW encouraged patient's wife to contact patient as needed. CSW will continue to follow patient for support and any social work needs that arise.   Employment status:  Retired Forensic scientist:  Medicare PT Recommendations:  Home with [place] / Referral to community resources:  Other (Comment Required) (None. Patient will return to ALF.)  Patient/Family's Response to care:  Patient's wife agreeable to return to ALF. Patient's family involved in patient's care and supportive. Patient's wife polite and appreciated social  work intervention.  Patient/Family's Understanding of and Emotional Response to Diagnosis, Current Treatment, and Prognosis:  Patient's family knowledgeable of medical interventions and aware of discharge today back to [facility].  Emotional Assessment Appearance:  Other (Comment Required (Spoke with wife over the phone.) Attitude/Demeanor/Rapport:  Unable to Assess Affect (typically observed):  Unable to Assess Orientation:  Oriented to Self, Oriented to Place, Oriented to  Time, Oriented to Situation Alcohol / Substance use:  Never Used Psych involvement (Current and /or in the community):  No (Comment)  Discharge Needs  Concerns to be addressed:  Care Coordination Readmission within the last 30 days:  No Current discharge risk:  Dependent with Mobility Barriers to Discharge:  No Barriers Identified   Candie Chroman, LCSW 04/05/2016, 12:35 PM

## 2016-04-05 NOTE — Discharge Summary (Signed)
Physician Discharge Summary  Patient ID: [name] MRN: [id number] DOB/AGE: 08/23/1925 82 y.o.  Admit date: 04/01/2016 Discharge date: 04/05/2016  Admission Diagnoses:  Discharge Diagnoses:  Active Problems: Acute renal failure (ARF) (HCC)   Electrolyte disturbance   Weakness   Digoxin toxicity   Type II diabetes mellitus with manifestations (HCC)   Persistent atrial fibrillation (HCC)   CAD (coronary artery disease)   Hypothyroid   Chronic systolic CHF (congestive heart failure) (HCC)      Discharged Condition: stable  Hospital Course: 82 year old male with history of A. fib, CHF, hypertension and diabetes, presents with generalized weakness, workup significant for worsening renal function , and digoxin toxicity , diuresis increased recently by PCP. Patient was admitted for further work up and management. Torsemide was held on admission. Patient was cautiously hydrated. With hydration, the acute Kidney injury has resolved. Torsemide will be decreased from 10mg  po once daily to 5mg  po once daily. There will be need for PCP to monitor patient closely and adjust diuretics accordingly. Digoxin level was noted to be elevated in setting of acute kidney injury. The digoxin level is back to normal range. Please continue to monitor digoxin level, and adjust dose accordingly. Patient is back to his baseline and will be discharged back today to the care of the Primary care provider.   Discharge Medication - See the Med Rec.  Discharge Exam: Blood pressure 106/41, pulse 70, temperature 97.4 F (36.3 C), temperature source Oral, resp. rate 18, height 6\' 3"  (1.905 m), weight 74.435 kg (164 lb 1.6 oz), SpO2 96 %.  Disposition: 03-Skilled Nursing Facility  Discharge Instructions    Call MD for:    Complete by:  As directed   Worsening symptoms     Diet - low sodium heart healthy    Complete by:  As directed      Diet Carb Modified    Complete by:  As directed      Discharge instructions     Complete by:  As directed   Check BMP in 2 days. Follow up with your PCP within one week     Increase activity slowly    Complete by:  As directed             Medication List    STOP taking these medications        glucosamine-chondroitin 500-400 MG tablet      TAKE these medications        amiodarone 200 MG tablet  Commonly known as:  PACERONE  Take 1 tablet (200 mg total) by mouth 2 (two) times daily.     apixaban 2.5 MG Tabs tablet  Commonly known as:  ELIQUIS  Take 1 tablet (2.5 mg total) by mouth 2 (two) times daily.     azelastine 0.1 % nasal spray  Commonly known as:  ASTELIN  Place 2 sprays into both nostrils 2 (two) times daily. Use in each nostril as directed     digoxin 0.125 MG tablet  Commonly known as:  LANOXIN  Take 0.5 tablets (0.0625 mg total) by mouth every other day.     feeding supplement (PRO-STAT SUGAR FREE 64) Liqd  Take 30 mLs by mouth 2 (two) times daily.     finasteride 5 MG tablet  Commonly known as:  PROSCAR  Take 1 tablet (5 mg total) by mouth daily.     levothyroxine 50 MCG tablet  Commonly known as:  SYNTHROID, LEVOTHROID  Take 1 tablet (50 mcg total) by  mouth daily.     lipase/protease/amylase 12000 units Cpep capsule  Commonly known as:  CREON  Take 1 capsule by mouth 3  times a day before meals     metoprolol tartrate 25 MG tablet  Commonly known as:  LOPRESSOR  Take 1 tablet (25 mg total) by mouth 2 (two) times daily.     multivitamin with minerals Tabs tablet  Take 1 tablet by mouth daily.     NOVOLOG FLEXPEN 100 UNIT/ML FlexPen  Generic drug:  insulin aspart  Inject 3-6 Units into the skin See admin instructions. Use 6 units at breakfast, use 3 units at lunch and use 5 units at supper     tamsulosin 0.4 MG Caps capsule  Commonly known as:  FLOMAX  Take 1 capsule (0.4 mg total) by mouth 2 (two) times daily.     torsemide 5 MG tablet  Commonly known as:  DEMADEX  Take 4 tablets (20 mg total) by mouth daily.      vitamin C 500 MG tablet  Commonly known as:  ASCORBIC ACID  Take 500 mg by mouth daily.           Follow-up Information    Follow up with [name], MD In 3 days.   Specialty:  Internal Medicine   Why:  Check BMP in 2 days   Contact information:   [address] [address] [phone]       Signed: Bonnell Public 04/05/2016, 12:27 PM

## 2016-04-05 NOTE — NC FL2 (Signed)
[place] LEVEL OF CARE SCREENING TOOL     IDENTIFICATION  Patient Name: [name] Birthdate: 11-Jun-1925 Sex: male Admission Date (Current Location): 04/01/2016  [facility] and [place] Number:  Herbalist and Address:  [facility], [place] [address], [place], [place] [address]      Provider Number: [id number]  Attending Physician Name and Address:  Bonnell Public, MD  Relative Name and Phone Number:       Current Level of Care: Hospital Recommended Level of Care: [place] Prior Approval Number:    Date Approved/Denied:   PASRR Number:    Discharge Plan: Other (Comment) (ALF)    Current Diagnoses: Patient Active Problem List   Diagnosis Date Noted  . Acute renal failure ([place])   . Acute renal failure (ARF) ([place]) 04/01/2016  . Electrolyte disturbance 04/01/2016  . Weakness 04/01/2016  . Digoxin toxicity   . Localized edema 03/07/2016  . Recurrent pleural effusion on right 12/02/2015  . Chronic systolic CHF (congestive heart failure) ([place]) 12/02/2015  . Insomnia due to anxiety and fear 12/01/2015  . BPH (benign prostatic hypertrophy) with urinary obstruction 06/10/2015  . Pancreatic insufficiency ([place]) 10/28/2014  . Allergic rhinitis due to pollen 10/27/2014  . Anemia of chronic disease 04/25/2014  . Hypothyroid 03/22/2014  . CAD (coronary artery disease) 10/30/2012  . Venous insufficiency of leg 09/11/2012  . Paroxysmal VT-proarrhythmia 2/2 tikosyn 03/04/2012  . NEOP, MALIGNANT, BILIARY DUCTS NEC 09/19/2007  . Type II diabetes mellitus with manifestations ([place]) 09/19/2007  . Persistent atrial fibrillation ([place]) 09/19/2007    Orientation RESPIRATION BLADDER Height & Weight     Self, Time, Situation, Place  Normal Continent Weight: 164 lb 1.6 oz (74.435 kg) (scale c) Height:  6\' 3"  (190.5 cm)  BEHAVIORAL SYMPTOMS/MOOD NEUROLOGICAL BOWEL NUTRITION STATUS   (None.)  (None) Continent Diet (Heart  healthy, carb-modified)  AMBULATORY STATUS COMMUNICATION OF NEEDS Skin   Supervision Verbally Normal                       Personal Care Assistance Level of Assistance  Bathing, Feeding, Dressing Bathing Assistance: Limited assistance Feeding assistance: Independent Dressing Assistance: Limited assistance     Functional Limitations Info  Sight, Hearing, Speech Sight Info: Adequate Hearing Info: Adequate Speech Info: Adequate    SPECIAL CARE FACTORS FREQUENCY  PT (By licensed PT), Diabetic urine testing     PT Frequency: 3 x week              Contractures Contractures Info: Not present    Additional Factors Info  Code Status, Allergies, Psychotropic Code Status Info: Full Allergies Info: Lisinopril, [place] Solostar, Clindomycin, Oxycodone-Acetaminophen, Penicillins Psychotropic Info: Trazodone 25 mg PO QHS prn         Current Medications (04/05/2016):  This is the current hospital active medication list Current Facility-Administered Medications  Medication Dose Route Frequency Provider Last Rate Last Dose  . apixaban (ELIQUIS) tablet 2.5 mg  2.5 mg Oral BID Willia Craze, NP   2.5 mg at 04/05/16 1100  . azelastine (ASTELIN) 0.1 % nasal spray 2 spray  2 spray Each Nare BID Willia Craze, NP   2 spray at 04/04/16 1021  . bisacodyl (DULCOLAX) EC tablet 5 mg  5 mg Oral Daily PRN Willia Craze, NP      . feeding supplement (PRO-STAT SUGAR FREE 64) liquid 30 mL  30 mL Oral BID Willia Craze, NP  30 mL at 04/05/16 1100  . finasteride (PROSCAR) tablet 5 mg  5 mg Oral Daily Willia Craze, NP   5 mg at 04/05/16 1100  . insulin aspart (novoLOG) injection 0-9 Units  0-9 Units Subcutaneous TID WC Willia Craze, NP   1 Units at 04/05/16 [phone]  . levothyroxine (SYNTHROID, LEVOTHROID) tablet 50 mcg  50 mcg Oral QAC breakfast Willia Craze, NP   50 mcg at 04/05/16 [phone]  . lipase/protease/amylase (CREON) capsule 12,000 Units  12,000 Units Oral TID WC Willia Craze, NP   12,000 Units at 04/05/16 1133  . metoprolol tartrate (LOPRESSOR) tablet 25 mg  25 mg Oral BID Willia Craze, NP   25 mg at 04/05/16 1100  . ondansetron (ZOFRAN) tablet 4 mg  4 mg Oral Q6H PRN Willia Craze, NP       Or  . ondansetron [facility]) injection 4 mg  4 mg Intravenous Q6H PRN Willia Craze, NP      . polyethylene glycol (MIRALAX / GLYCOLAX) packet 17 g  17 g Oral Daily PRN Willia Craze, NP      . sodium chloride flush (NS) 0.9 % injection 3 mL  3 mL Intravenous Q12H Willia Craze, NP   3 mL at 04/04/16 2324  . tamsulosin (FLOMAX) capsule 0.4 mg  0.4 mg Oral BID Willia Craze, NP   0.4 mg at 04/05/16 1100  . traZODone (DESYREL) tablet 25 mg  25 mg Oral QHS PRN Willia Craze, NP      . vitamin C (ASCORBIC ACID) tablet 500 mg  500 mg Oral Daily Willia Craze, NP   500 mg at 04/05/16 1100     Discharge Medications: Please see discharge summary for a list of discharge medications.  Relevant Imaging Results:  Relevant Lab Results:   Additional Information SS# [id number]  Candie Chroman, LCSW

## 2016-04-05 NOTE — Clinical Social Work Note (Addendum)
CSW called patient's wife to notify her that patient was discharging today. Patient will be transported by his son-in-law. FL-2 complete and waiting for physician signature.  Called [facility] and left a voicemail with admissions making them aware that patient was discharging today and CSW would fax information.  CSW signing off.  Dayton Scrape, [place]

## 2016-04-05 NOTE — Progress Notes (Signed)
Physical Therapy Treatment Patient Details Name: [name] MRN: [id number] DOB: Apr 18, 1925 Today's Date: 04/05/2016    History of Present Illness Patient is an 82 yo male admitted 04/01/16 with weakness.  Patient with dig toxicity, acute renal failure, bradycardia.   PMH:  Afib, CHF, HTN, DM, CAD, falls    PT Comments    Patient eager to d/c home today. Balance improved from 5/8, however continues to need RW.   Follow Up Recommendations  Home health PT;Supervision - Intermittent     Equipment Recommendations  None recommended by PT    Recommendations for Other Services       Precautions / Restrictions Precautions Precautions: Fall Restrictions Weight Bearing Restrictions: No    Mobility  Bed Mobility Overal bed mobility: Modified Independent             General bed mobility comments: incr effort and time  Transfers Overall transfer level: Needs assistance Equipment used: Rolling walker (2 wheeled)   Sit to Stand: Min guard         General transfer comment: x2; required several attempts from recliner before successful, however only minguard assist for safety  Ambulation/Gait Ambulation/Gait assistance: Min guard Ambulation Distance (Feet): 120 Feet Assistive device: Rolling walker (2 wheeled) Gait Pattern/deviations: Step-through pattern;Decreased stride length;Trunk flexed Gait velocity: decreased   General Gait Details: Vc for proximity to RW and to look forward, not downward   Science writer    Modified Rankin (Stroke Patients Only)       Balance           Standing balance support: Single extremity supported Standing balance-Leahy Scale: Poor                      Cognition Arousal/Alertness: Awake/alert Behavior During Therapy: WFL for tasks assessed/performed Overall Cognitive Status: Within Functional Limits for tasks assessed                      Exercises General Exercises - Lower  Extremity Hip ABduction/ADduction: AAROM;Both;10 reps;Standing (alternating; assist for balance; bil UE support) Hip Flexion/Marching: AAROM;Both;15 reps;Standing (alternating; assist for balance; bil UE support) Toe Raises: AAROM;Both;10 reps;Standing (assist for balance; bil UE support) Heel Raises: AAROM;Both;Standing (7 reps; assist for balance; bil UE support)    General Comments        Pertinent Vitals/Pain Pain Assessment: No/denies pain    Home Living                      Prior Function            PT Goals (current goals can now be found in the care plan section) Acute Rehab PT Goals Patient Stated Goal: To return home Time For Goal Achievement: 04/09/16 Progress towards PT goals: Progressing toward goals    Frequency  Min 3X/week    PT Plan Current plan remains appropriate    Co-evaluation             End of Session Equipment Utilized During Treatment: Gait belt Activity Tolerance: Patient tolerated treatment well Patient left: in chair;with call bell/phone within reach     Time: 1028-1053 PT Time Calculation (min) (ACUTE ONLY): 25 min  Charges:  $Gait Training: 8-22 mins $Therapeutic Exercise: 8-22 mins                    G Codes:  [name] 04/05/2016, 2:33 PM Pager [phone]

## 2016-04-06 ENCOUNTER — Telehealth: Payer: Self-pay

## 2016-04-06 NOTE — Telephone Encounter (Signed)
Dr Shelbie Proctor (cell phone [phone]) called to say that patient feels very intimidated/unsure about hospice care (has refused hospice care)---but would be willing to receive help from palliative services---can you please fax palliative order to fax [phone] if you are ok with ordering palliative services for this patient---if you have any questions, can call dr Shelbie Proctor at above cell phone number---routing to dr Ronnald Ramp, please advise, thanks

## 2016-04-07 ENCOUNTER — Telehealth: Payer: Self-pay | Admitting: *Deleted

## 2016-04-07 NOTE — Telephone Encounter (Signed)
Pt was on TCM list admitted for acute renal failure, electrolyte disturbance, weakness. Pt was D/C 04/05/16 sent to SNF...Johny Chess

## 2016-04-08 ENCOUNTER — Other Ambulatory Visit (INDEPENDENT_AMBULATORY_CARE_PROVIDER_SITE_OTHER): Payer: Medicare Other

## 2016-04-08 ENCOUNTER — Encounter: Payer: Self-pay | Admitting: Internal Medicine

## 2016-04-08 ENCOUNTER — Ambulatory Visit (INDEPENDENT_AMBULATORY_CARE_PROVIDER_SITE_OTHER): Payer: Medicare Other | Admitting: Internal Medicine

## 2016-04-08 VITALS — BP 100/58 | HR 69 | Wt 169.0 lb

## 2016-04-08 DIAGNOSIS — E559 Vitamin D deficiency, unspecified: Secondary | ICD-10-CM

## 2016-04-08 DIAGNOSIS — Z794 Long term (current) use of insulin: Secondary | ICD-10-CM

## 2016-04-08 DIAGNOSIS — R531 Weakness: Secondary | ICD-10-CM

## 2016-04-08 DIAGNOSIS — E118 Type 2 diabetes mellitus with unspecified complications: Secondary | ICD-10-CM

## 2016-04-08 DIAGNOSIS — K8689 Other specified diseases of pancreas: Secondary | ICD-10-CM

## 2016-04-08 DIAGNOSIS — I5022 Chronic systolic (congestive) heart failure: Secondary | ICD-10-CM | POA: Diagnosis not present

## 2016-04-08 DIAGNOSIS — R202 Paresthesia of skin: Secondary | ICD-10-CM

## 2016-04-08 DIAGNOSIS — N170 Acute kidney failure with tubular necrosis: Secondary | ICD-10-CM

## 2016-04-08 DIAGNOSIS — J90 Pleural effusion, not elsewhere classified: Secondary | ICD-10-CM

## 2016-04-08 LAB — CBC WITH DIFFERENTIAL/PLATELET
BASOS PCT: 0.2 % (ref 0.0–3.0)
Basophils Absolute: 0 10*3/uL (ref 0.0–0.1)
EOS PCT: 0.1 % (ref 0.0–5.0)
Eosinophils Absolute: 0 10*3/uL (ref 0.0–0.7)
HCT: 33.7 % — ABNORMAL LOW (ref 39.0–52.0)
Hemoglobin: 10.9 g/dL — ABNORMAL LOW (ref 13.0–17.0)
LYMPHS ABS: 0.6 10*3/uL — AB (ref 0.7–4.0)
Lymphocytes Relative: 8.2 % — ABNORMAL LOW (ref 12.0–46.0)
MCHC: 32.2 g/dL (ref 30.0–36.0)
MCV: 96.1 fl (ref 78.0–100.0)
MONO ABS: 0.9 10*3/uL (ref 0.1–1.0)
Monocytes Relative: 11 % (ref 3.0–12.0)
Neutro Abs: 6.3 10*3/uL (ref 1.4–7.7)
Neutrophils Relative %: 80.5 % — ABNORMAL HIGH (ref 43.0–77.0)
Platelets: 127 10*3/uL — ABNORMAL LOW (ref 150.0–400.0)
RBC: 3.51 Mil/uL — ABNORMAL LOW (ref 4.22–5.81)
RDW: 15.1 % (ref 11.5–15.5)
WBC: 7.8 10*3/uL (ref 4.0–10.5)

## 2016-04-08 LAB — HEPATIC FUNCTION PANEL
ALBUMIN: 2.9 g/dL — AB (ref 3.5–5.2)
ALT: 78 U/L — AB (ref 0–53)
AST: 68 U/L — ABNORMAL HIGH (ref 0–37)
Alkaline Phosphatase: 153 U/L — ABNORMAL HIGH (ref 39–117)
BILIRUBIN TOTAL: 1.2 mg/dL (ref 0.2–1.2)
Bilirubin, Direct: 0.5 mg/dL — ABNORMAL HIGH (ref 0.0–0.3)
Total Protein: 6.4 g/dL (ref 6.0–8.3)

## 2016-04-08 LAB — BASIC METABOLIC PANEL
BUN: 61 mg/dL — ABNORMAL HIGH (ref 6–23)
CALCIUM: 8.4 mg/dL (ref 8.4–10.5)
CO2: 33 mEq/L — ABNORMAL HIGH (ref 19–32)
CREATININE: 2.06 mg/dL — AB (ref 0.40–1.50)
Chloride: 88 mEq/L — ABNORMAL LOW (ref 96–112)
GFR: 32.49 mL/min — AB (ref >60.00)
GLUCOSE: 129 mg/dL — AB (ref 70–99)
Potassium: 4.1 mEq/L (ref 3.5–5.1)
SODIUM: 129 meq/L — AB (ref 135–145)

## 2016-04-08 LAB — VITAMIN D 25 HYDROXY (VIT D DEFICIENCY, FRACTURES): VITD: 21.95 ng/mL — AB (ref 30.00–100.00)

## 2016-04-08 LAB — VITAMIN B12: Vitamin B-12: 1386 pg/mL — ABNORMAL HIGH (ref 211–911)

## 2016-04-08 MED ORDER — VITAMIN D 1000 UNITS PO TABS: 1000.0000 [IU] | ORAL_TABLET | Freq: Every day | ORAL | Status: AC

## 2016-04-08 MED ORDER — ERGOCALCIFEROL 1.25 MG (50000 UT) PO CAPS: 50000.0000 [IU] | ORAL_CAPSULE | ORAL | Status: AC

## 2016-04-08 NOTE — Progress Notes (Signed)
Subjective:  Patient ID: [name], male    DOB: [birth year]  Age: 82 y.o. MRN: [id number]  CC: No chief complaint on file.   HPI [name] presents for a post-hosp f/u. He was d/c'd on 5.9.17. F/u severe CHF, recurrent R pleural effusion, FTT - needs Palliative Care. Needs PT for FTT and weakness. C/o occ low CBG  Outpatient Prescriptions Prior to Visit  Medication Sig Dispense Refill  . Amino Acids-Protein Hydrolys (FEEDING SUPPLEMENT, PRO-STAT SUGAR FREE 64,) LIQD Take 30 mLs by mouth 2 (two) times daily.    Marland Kitchen amiodarone (PACERONE) 200 MG tablet Take 1 tablet (200 mg total) by mouth 2 (two) times daily. 180 tablet 2  . apixaban (ELIQUIS) 2.5 MG TABS tablet Take 1 tablet (2.5 mg total) by mouth 2 (two) times daily. 180 tablet 2  . Ascorbic Acid (VITAMIN C) 500 MG tablet Take 500 mg by mouth daily.     Marland Kitchen azelastine (ASTELIN) 0.1 % nasal spray Place 2 sprays into both nostrils 2 (two) times daily. Use in each nostril as directed 90 mL 3  . digoxin (LANOXIN) 0.125 MG tablet Take 0.5 tablets (0.0625 mg total) by mouth every other day. 30 tablet 0  . finasteride (PROSCAR) 5 MG tablet Take 1 tablet (5 mg total) by mouth daily. 90 tablet 3  . insulin aspart (NOVOLOG FLEXPEN) 100 UNIT/ML FlexPen Inject 3-6 Units into the skin See admin instructions. Use 6 units at breakfast, use 3 units at lunch and use 5 units at supper    . levothyroxine (SYNTHROID, LEVOTHROID) 50 MCG tablet Take 1 tablet (50 mcg total) by mouth daily. 90 tablet 1  . lipase/protease/amylase (CREON) 12000 units CPEP capsule Take 1 capsule by mouth 3  times a day before meals 300 capsule 3  . metoprolol tartrate (LOPRESSOR) 25 MG tablet Take 1 tablet (25 mg total) by mouth 2 (two) times daily. 180 tablet 3  . Multiple Vitamin (MULTIVITAMIN WITH MINERALS) TABS tablet Take 1 tablet by mouth daily.    . tamsulosin (FLOMAX) 0.4 MG CAPS capsule Take 1 capsule (0.4 mg total) by mouth 2 (two) times daily. 180 capsule 3  .  torsemide (DEMADEX) 5 MG tablet Take 4 tablets (20 mg total) by mouth daily. 30 tablet 0   No facility-administered medications prior to visit.    ROS Review of Systems  Constitutional: Positive for fatigue. Negative for fever, appetite change and unexpected weight change.  HENT: Negative for congestion, nosebleeds, sneezing, sore throat and trouble swallowing.   Eyes: Negative for itching and visual disturbance.  Respiratory: Positive for shortness of breath. Negative for cough and wheezing.   Cardiovascular: Negative for chest pain, palpitations and leg swelling.  Gastrointestinal: Negative for nausea, diarrhea, blood in stool and abdominal distention.  Genitourinary: Negative for frequency and hematuria.  Musculoskeletal: Positive for gait problem. Negative for back pain, joint swelling and neck pain.  Skin: Negative for rash.  Neurological: Positive for weakness. Negative for dizziness, tremors and speech difficulty.  Psychiatric/Behavioral: Negative for confusion, sleep disturbance, dysphoric mood and agitation. The patient is not nervous/anxious.     Objective:  BP 100/58 mmHg  Pulse 69  Wt 169 lb (76.658 kg)  SpO2 96%  BP Readings from Last 3 Encounters:  04/08/16 100/58  04/05/16 106/41  03/30/16 98/58    Wt Readings from Last 3 Encounters:  04/08/16 169 lb (76.658 kg)  04/05/16 164 lb 1.6 oz (74.435 kg)  03/30/16 159 lb (72.122 kg)  Physical Exam  Constitutional: He is oriented to person, place, and time. He appears well-developed. No distress.  NAD  HENT:  Mouth/Throat: Oropharynx is clear and moist. No oropharyngeal exudate.  Eyes: Conjunctivae are normal. Pupils are equal, round, and reactive to light.  Neck: Normal range of motion. No JVD present. No thyromegaly present.  Cardiovascular: Normal rate, regular rhythm, normal heart sounds and intact distal pulses.  Exam reveals no gallop and no friction rub.   No murmur heard. Pulmonary/Chest: Effort normal  and breath sounds normal. No respiratory distress. He has no wheezes. He has no rales. He exhibits no tenderness.  Abdominal: Soft. Bowel sounds are normal. He exhibits no distension and no mass. There is no tenderness. There is no rebound and no guarding.  Musculoskeletal: Normal range of motion. He exhibits no edema or tenderness.  Lymphadenopathy:    He has no cervical adenopathy.  Neurological: He is alert and oriented to person, place, and time. He has normal reflexes. No cranial nerve deficit. He exhibits normal muscle tone. He displays a negative Romberg sign. Coordination abnormal. Gait normal.  Skin: Skin is warm and dry. No rash noted.  Psychiatric: He has a normal mood and affect. His behavior is normal. Judgment and thought content normal.  chronically ill appearing decr BS at R base A/o/c  Lab Results  Component Value Date   WBC 6.8 04/05/2016   HGB 10.5* 04/05/2016   HCT 33.0* 04/05/2016   PLT 123* 04/05/2016   GLUCOSE 142* 04/05/2016   CHOL 114 10/21/2015   TRIG 59.0 10/21/2015   HDL 46.20 10/21/2015   LDLCALC 56 10/21/2015   ALT 114* 04/05/2016   AST 110* 04/05/2016   NA 136 04/05/2016   K 3.6 04/05/2016   CL 91* 04/05/2016   CREATININE 1.51* 04/05/2016   BUN 41* 04/05/2016   CO2 33* 04/05/2016   TSH 7.18* 03/30/2016   PSA 2.28 01/04/2010   INR 1.65* 04/01/2016   HGBA1C 6.7* 01/21/2016   MICROALBUR <0.7 02/25/2015    Dg Chest 2 View  04/01/2016  CLINICAL DATA:  Weakness, dehydration, shortness of breath. EXAM: CHEST  2 VIEW COMPARISON:  03/30/2016 FINDINGS: Retained catheter fragment again noted within the right lung, stable. Mild cardiomegaly is stable. Stable consolidation in the right mid and lower lung with small right effusion. Increasing left basilar opacity, likely atelectasis. No acute bony abnormality. IMPRESSION: Stable right mid and lower lung opacity with small right effusion. Increasing left base atelectasis. Electronically Signed   By: Rolm Baptise  M.D.   On: 04/01/2016 11:31    Assessment & Plan:   There are no diagnoses linked to this encounter. I am having Mr. Castro maintain his vitamin C, tamsulosin, finasteride, azelastine, amiodarone, metoprolol tartrate, insulin aspart, feeding supplement (PRO-STAT SUGAR FREE 64), multivitamin with minerals, apixaban, lipase/protease/amylase, levothyroxine, digoxin, and torsemide.  Meds ordered this encounter  Medications  . torsemide (DEMADEX) 10 MG tablet    Sig: Take 2 tablets by mouth daily.     Follow-up: No Follow-up on file.  Walker Kehr, MD

## 2016-04-08 NOTE — Assessment & Plan Note (Signed)
Start PT 

## 2016-04-08 NOTE — Assessment & Plan Note (Signed)
In the context of Whipple's procedure remote

## 2016-04-08 NOTE — Progress Notes (Signed)
Pre visit review using our clinic review tool, if applicable. No additional management support is needed unless otherwise documented below in the visit note. 

## 2016-04-08 NOTE — Assessment & Plan Note (Signed)
D/c Levemir 3 units qHS Continue Novolog flex pen: 6 -5-6 units before meals

## 2016-04-08 NOTE — Assessment & Plan Note (Signed)
Labs Palliative Care

## 2016-04-08 NOTE — Assessment & Plan Note (Signed)
Being monitored

## 2016-04-08 NOTE — Assessment & Plan Note (Signed)
Acute on chronic °

## 2016-04-09 ENCOUNTER — Encounter: Payer: Self-pay | Admitting: Internal Medicine

## 2016-04-10 NOTE — Telephone Encounter (Signed)
Yes, pls fax an order for palliative care

## 2016-04-11 ENCOUNTER — Encounter: Payer: Self-pay | Admitting: Internal Medicine

## 2016-04-11 ENCOUNTER — Other Ambulatory Visit: Payer: Self-pay | Admitting: Internal Medicine

## 2016-04-11 DIAGNOSIS — N178 Other acute kidney failure: Secondary | ICD-10-CM

## 2016-04-11 DIAGNOSIS — E878 Other disorders of electrolyte and fluid balance, not elsewhere classified: Secondary | ICD-10-CM

## 2016-04-11 DIAGNOSIS — I4819 Other persistent atrial fibrillation: Secondary | ICD-10-CM

## 2016-04-11 NOTE — Telephone Encounter (Signed)
Orders faxed

## 2016-04-13 ENCOUNTER — Telehealth: Payer: Self-pay

## 2016-04-13 MED ORDER — DOXYCYCLINE HYCLATE 100 MG PO TBEC: 100.0000 mg | DELAYED_RELEASE_TABLET | Freq: Two times a day (BID) | ORAL | Status: AC

## 2016-04-13 NOTE — Telephone Encounter (Signed)
Nurse Wynell Balloon NP with Palliative Care. Reports extreme swelling in both legs hot to the touch. Recommending abx to be sent in as well as diuretic. Feels like this would be beneficial starting today pt on schedule for tomorrow . PCP was told verbal . agreed

## 2016-04-13 NOTE — Telephone Encounter (Signed)
rx sent

## 2016-04-14 ENCOUNTER — Ambulatory Visit (INDEPENDENT_AMBULATORY_CARE_PROVIDER_SITE_OTHER): Payer: Medicare Other | Admitting: Internal Medicine

## 2016-04-14 ENCOUNTER — Encounter: Payer: Self-pay | Admitting: Internal Medicine

## 2016-04-14 ENCOUNTER — Other Ambulatory Visit (INDEPENDENT_AMBULATORY_CARE_PROVIDER_SITE_OTHER): Payer: Medicare Other

## 2016-04-14 ENCOUNTER — Telehealth: Payer: Self-pay | Admitting: *Deleted

## 2016-04-14 ENCOUNTER — Other Ambulatory Visit: Payer: Self-pay | Admitting: *Deleted

## 2016-04-14 VITALS — BP 96/58 | HR 67 | Temp 97.9°F | Resp 16 | Ht 75.0 in

## 2016-04-14 DIAGNOSIS — E118 Type 2 diabetes mellitus with unspecified complications: Secondary | ICD-10-CM

## 2016-04-14 DIAGNOSIS — R531 Weakness: Secondary | ICD-10-CM | POA: Diagnosis not present

## 2016-04-14 DIAGNOSIS — I872 Venous insufficiency (chronic) (peripheral): Secondary | ICD-10-CM | POA: Insufficient documentation

## 2016-04-14 DIAGNOSIS — I8311 Varicose veins of right lower extremity with inflammation: Secondary | ICD-10-CM

## 2016-04-14 DIAGNOSIS — R600 Localized edema: Secondary | ICD-10-CM | POA: Diagnosis not present

## 2016-04-14 DIAGNOSIS — I8312 Varicose veins of left lower extremity with inflammation: Secondary | ICD-10-CM | POA: Diagnosis not present

## 2016-04-14 DIAGNOSIS — Z794 Long term (current) use of insulin: Secondary | ICD-10-CM

## 2016-04-14 DIAGNOSIS — I2510 Atherosclerotic heart disease of native coronary artery without angina pectoris: Secondary | ICD-10-CM

## 2016-04-14 DIAGNOSIS — I5022 Chronic systolic (congestive) heart failure: Secondary | ICD-10-CM | POA: Diagnosis not present

## 2016-04-14 DIAGNOSIS — N170 Acute kidney failure with tubular necrosis: Secondary | ICD-10-CM | POA: Diagnosis not present

## 2016-04-14 LAB — BASIC METABOLIC PANEL
BUN: 70 mg/dL — AB (ref 6–23)
CO2: 33 mEq/L — ABNORMAL HIGH (ref 19–32)
CREATININE: 2.38 mg/dL — AB (ref 0.40–1.50)
Calcium: 8.6 mg/dL (ref 8.4–10.5)
Chloride: 96 mEq/L (ref 96–112)
GFR: 27.5 mL/min — AB (ref >60.00)
GLUCOSE: 157 mg/dL — AB (ref 70–99)
POTASSIUM: 4.4 meq/L (ref 3.5–5.1)
Sodium: 135 mEq/L (ref 135–145)

## 2016-04-14 LAB — CBC WITH DIFFERENTIAL/PLATELET
Basophils Absolute: 0.1 10*3/uL (ref 0.0–0.1)
Basophils Relative: 0.8 % (ref 0.0–3.0)
EOS ABS: 0.1 10*3/uL (ref 0.0–0.7)
EOS PCT: 1.2 % (ref 0.0–5.0)
HCT: 33.8 % — ABNORMAL LOW (ref 39.0–52.0)
HEMOGLOBIN: 11.4 g/dL — AB (ref 13.0–17.0)
LYMPHS ABS: 0.7 10*3/uL (ref 0.7–4.0)
Lymphocytes Relative: 10.7 % — ABNORMAL LOW (ref 12.0–46.0)
MCHC: 33.8 g/dL (ref 30.0–36.0)
MCV: 97.1 fl (ref 78.0–100.0)
MONO ABS: 0.9 10*3/uL (ref 0.1–1.0)
Monocytes Relative: 14.8 % — ABNORMAL HIGH (ref 3.0–12.0)
Neutro Abs: 4.6 10*3/uL (ref 1.4–7.7)
Neutrophils Relative %: 72.5 % (ref 43.0–77.0)
PLATELETS: 174 10*3/uL (ref 150.0–400.0)
RBC: 3.48 Mil/uL — AB (ref 4.22–5.81)
RDW: 16.1 % — ABNORMAL HIGH (ref 11.5–15.5)
WBC: 6.3 10*3/uL (ref 4.0–10.5)

## 2016-04-14 LAB — HEPATIC FUNCTION PANEL
ALBUMIN: 2.8 g/dL — AB (ref 3.5–5.2)
ALK PHOS: 162 U/L — AB (ref 39–117)
ALT: 53 U/L (ref 0–53)
AST: 58 U/L — ABNORMAL HIGH (ref 0–37)
Bilirubin, Direct: 0.4 mg/dL — ABNORMAL HIGH (ref 0.0–0.3)
TOTAL PROTEIN: 6.3 g/dL (ref 6.0–8.3)
Total Bilirubin: 1 mg/dL (ref 0.2–1.2)

## 2016-04-14 MED ORDER — METOLAZONE 2.5 MG PO TABS: 2.5000 mg | ORAL_TABLET | ORAL | Status: DC

## 2016-04-14 NOTE — Telephone Encounter (Signed)
Left msg on triage stating MD sent rx in for Doryx (Doxycycline) med cost $800, and we do not carry because it is too expensive. Wanting to see if they can change to plain Doxycycline 100 mg instead...Johny Chess

## 2016-04-14 NOTE — Patient Outreach (Signed)
[facility]) Care Management  04/14/2016  AUDRICK GENCO [birth year] [id number]  Referral via EMMI-heart failure-dashboard/red on new/worsening problems and new/worsening shortness of breath 04/13/2016.  Telephone call to patient; left message on voice mail requesting call back.  Plan: Will follow up.  Sherrin Daisy, RN BSN [place] Management Coordinator [facility] Care Management  [phone]

## 2016-04-14 NOTE — Progress Notes (Signed)
Subjective:  Patient ID: [name], male    DOB: [birth year]  Age: 82 y.o. MRN: [id number]  CC: Congestive Heart Failure   HPI [name] presents for follow-up regarding congestive heart failure. He returns today complaining of worsening edema in his lower extremities. He was admitted as an inpatient about 2 or 3 weeks ago because he was over diuresed and he had ARF, digoxin toxicity, prerenal azotemia and hyponatremia. He was restarted on 20 mg of Demadex a day but he is developing trouble with lower extremity edema. I was called yesterday I by the palliative care nurse who complained that he had redness and weeping his lower extremities, she requested antibiotic order, doxycycline has been started, he tells me is not helping very much. Over the last few weeks he has developed weakness and is now wheelchair dependent. The family is requesting a wheelchair and a chair left.  Outpatient Prescriptions Prior to Visit  Medication Sig Dispense Refill  . Amino Acids-Protein Hydrolys (FEEDING SUPPLEMENT, PRO-STAT SUGAR FREE 64,) LIQD Take 30 mLs by mouth 2 (two) times daily.    Marland Kitchen amiodarone (PACERONE) 200 MG tablet Take 1 tablet (200 mg total) by mouth 2 (two) times daily. 180 tablet 2  . apixaban (ELIQUIS) 2.5 MG TABS tablet Take 1 tablet (2.5 mg total) by mouth 2 (two) times daily. 180 tablet 2  . Ascorbic Acid (VITAMIN C) 500 MG tablet Take 500 mg by mouth daily.     Marland Kitchen azelastine (ASTELIN) 0.1 % nasal spray Place 2 sprays into both nostrils 2 (two) times daily. Use in each nostril as directed 90 mL 3  . cholecalciferol (VITAMIN D) 1000 units tablet Take 1 tablet (1,000 Units total) by mouth daily. 100 tablet 3  . digoxin (LANOXIN) 0.125 MG tablet Take 0.5 tablets (0.0625 mg total) by mouth every other day. 30 tablet 0  . doxycycline (DORYX) 100 MG EC tablet Take 1 tablet (100 mg total) by mouth 2 (two) times daily. 20 tablet 0  . ergocalciferol (VITAMIN D2) 50000 units capsule Take 1  capsule (50,000 Units total) by mouth once a week. 6 capsule 0  . finasteride (PROSCAR) 5 MG tablet Take 1 tablet (5 mg total) by mouth daily. 90 tablet 3  . insulin aspart (NOVOLOG FLEXPEN) 100 UNIT/ML FlexPen Inject 3-6 Units into the skin See admin instructions. Use 6 units at breakfast, use 3 units at lunch and use 5 units at supper    . levothyroxine (SYNTHROID, LEVOTHROID) 50 MCG tablet Take 1 tablet (50 mcg total) by mouth daily. 90 tablet 1  . lipase/protease/amylase (CREON) 12000 units CPEP capsule Take 1 capsule by mouth 3  times a day before meals 300 capsule 3  . metoprolol tartrate (LOPRESSOR) 25 MG tablet Take 1 tablet (25 mg total) by mouth 2 (two) times daily. 180 tablet 3  . Multiple Vitamin (MULTIVITAMIN WITH MINERALS) TABS tablet Take 1 tablet by mouth daily.    . tamsulosin (FLOMAX) 0.4 MG CAPS capsule Take 1 capsule (0.4 mg total) by mouth 2 (two) times daily. 180 capsule 3  . torsemide (DEMADEX) 10 MG tablet Take 2 tablets by mouth daily.     No facility-administered medications prior to visit.    ROS Review of Systems  Constitutional: Positive for fatigue. Negative for fever, chills and diaphoresis.  HENT: Negative.   Eyes: Negative.  Negative for visual disturbance.  Respiratory: Positive for shortness of breath. Negative for cough, choking, chest tightness, wheezing and stridor.   Cardiovascular:  Positive for leg swelling. Negative for chest pain and palpitations.  Gastrointestinal: Negative.  Negative for nausea, vomiting, abdominal pain, diarrhea and constipation.  Endocrine: Negative.   Genitourinary: Negative.   Musculoskeletal: Positive for gait problem. Negative for myalgias, back pain, arthralgias and neck pain.  Skin: Negative.  Negative for color change and pallor.  Allergic/Immunologic: Negative.   Neurological: Positive for weakness. Negative for dizziness.  Hematological: Negative.  Negative for adenopathy. Does not bruise/bleed easily.    Psychiatric/Behavioral: Negative.  Negative for hallucinations, behavioral problems and sleep disturbance. The patient is not nervous/anxious.     Objective:  BP 96/58 mmHg  Pulse 67  Temp(Src) 97.9 F (36.6 C) (Oral)  Resp 16  Ht 6\' 3"  (1.905 m)  Wt   SpO2 95%  BP Readings from Last 3 Encounters:  04/14/16 96/58  04/08/16 100/58  04/05/16 106/41    Wt Readings from Last 3 Encounters:  04/08/16 169 lb (76.658 kg)  04/05/16 164 lb 1.6 oz (74.435 kg)  03/30/16 159 lb (72.122 kg)    Physical Exam  Constitutional: He is oriented to person, place, and time.  Non-toxic appearance. He has a sickly appearance. He appears ill. No distress.  Frail, elderly male, wheelchair bound  HENT:  Mouth/Throat: No oropharyngeal exudate.  Eyes: Conjunctivae are normal. Right eye exhibits no discharge. Left eye exhibits no discharge. No scleral icterus.  Neck: Normal range of motion. Neck supple. No JVD present. No tracheal deviation present. No thyromegaly present.  Cardiovascular: Normal rate, regular rhythm and intact distal pulses.  Exam reveals gallop and S3. Exam reveals no S4 and no friction rub.   No murmur heard. Pulmonary/Chest: Effort normal. No stridor. No respiratory distress. He has no decreased breath sounds. He has no wheezes. He has no rhonchi. He has rales in the right lower field and the left lower field. He exhibits no tenderness.  Abdominal: Soft. Bowel sounds are normal. He exhibits no distension and no mass. There is no tenderness. There is no rebound and no guarding.  Musculoskeletal: Normal range of motion. He exhibits edema. He exhibits no tenderness.  Both lower extremities so symmetrical 2-3+ pitting edema with large areas of faint, pinkish, blanching erythema and slight oozing. There are no ulcers, purulent exudates, streaking, or tenderness palpation.  Lymphadenopathy:    He has no cervical adenopathy.  Neurological: He is oriented to person, place, and time.  Skin:  Skin is warm and dry. No rash noted. He is not diaphoretic. No erythema. No pallor.  Vitals reviewed.   Lab Results  Component Value Date   WBC 6.3 04/14/2016   HGB 11.4* 04/14/2016   HCT 33.8* 04/14/2016   PLT 174.0 04/14/2016   GLUCOSE 157* 04/14/2016   CHOL 114 10/21/2015   TRIG 59.0 10/21/2015   HDL 46.20 10/21/2015   LDLCALC 56 10/21/2015   ALT 53 04/14/2016   AST 58* 04/14/2016   NA 135 04/14/2016   K 4.4 04/14/2016   CL 96 04/14/2016   CREATININE 2.38* 04/14/2016   BUN 70* 04/14/2016   CO2 33* 04/14/2016   TSH 7.18* 03/30/2016   PSA 2.28 01/04/2010   INR 1.65* 04/01/2016   HGBA1C 6.7* 01/21/2016   MICROALBUR <0.7 02/25/2015    Dg Chest 2 View  04/01/2016  CLINICAL DATA:  Weakness, dehydration, shortness of breath. EXAM: CHEST  2 VIEW COMPARISON:  03/30/2016 FINDINGS: Retained catheter fragment again noted within the right lung, stable. Mild cardiomegaly is stable. Stable consolidation in the right mid and lower lung  with small right effusion. Increasing left basilar opacity, likely atelectasis. No acute bony abnormality. IMPRESSION: Stable right mid and lower lung opacity with small right effusion. Increasing left base atelectasis. Electronically Signed   By: Rolm Baptise M.D.   On: 04/01/2016 11:31    Assessment & Plan:   [name] was seen today for congestive heart failure.  Diagnoses and all orders for this visit:  Venous stasis dermatitis of both lower extremities- I don't think his lower extremities appear to be infected but will empirically continue doxycycline, the main concern is that he is having complications from pitting edema, I've asked him to continue the [facility] but will also add Zaroxolyn back on every other day -     metolazone (ZAROXOLYN) 2.5 MG tablet; Take 1 tablet (2.5 mg total) by mouth every other day.  Chronic systolic CHF (congestive heart failure) ([place])- he has signs and symptoms consistent with fluid retention, he will continue Demadex at the  current dose but I've also asked him to add on Zaroxolyn every other day, his recent renal function electrolytes appear improved. -     metolazone (ZAROXOLYN) 2.5 MG tablet; Take 1 tablet (2.5 mg total) by mouth every other day. -     DME Other see comment  Localized edema- continue Demadex, add on Zaroxolyn -     metolazone (ZAROXOLYN) 2.5 MG tablet; Take 1 tablet (2.5 mg total) by mouth every other day.  Weakness -     DME Other see comment  I am having [name] start on metolazone. I am also having him maintain his vitamin C, tamsulosin, finasteride, azelastine, amiodarone, metoprolol tartrate, insulin aspart, feeding supplement (PRO-STAT SUGAR FREE 64), multivitamin with minerals, apixaban, lipase/protease/amylase, levothyroxine, digoxin, torsemide, cholecalciferol, ergocalciferol, and doxycycline.  Meds ordered this encounter  Medications  . metolazone (ZAROXOLYN) 2.5 MG tablet    Sig: Take 1 tablet (2.5 mg total) by mouth every other day.    Dispense:  30 tablet    Refill:  1     Follow-up: Return in about 4 weeks (around 05/12/2016).  [name], MD

## 2016-04-14 NOTE — Patient Instructions (Signed)
Edema °Edema is an abnormal buildup of fluids in your body tissues. Edema is somewhat dependent on gravity to pull the fluid to the lowest place in your body. That makes the condition more common in the legs and thighs (lower extremities). Painless swelling of the feet and ankles is common and becomes more likely as you get older. It is also common in looser tissues, like around your eyes.  °When the affected area is squeezed, the fluid may move out of that spot and leave a dent for a few moments. This dent is called pitting.  °CAUSES  °There are many possible causes of edema. Eating too much salt and being on your feet or sitting for a long time can cause edema in your legs and ankles. Hot weather may make edema worse. Common medical causes of edema include: °· Heart failure. °· Liver disease. °· Kidney disease. °· Weak blood vessels in your legs. °· Cancer. °· An injury. °· Pregnancy. °· Some medications. °· Obesity.  °SYMPTOMS  °Edema is usually painless. Your skin may look swollen or shiny.  °DIAGNOSIS  °Your health care provider may be able to diagnose edema by asking about your medical history and doing a physical exam. You may need to have tests such as X-rays, an electrocardiogram, or blood tests to check for medical conditions that may cause edema.  °TREATMENT  °Edema treatment depends on the cause. If you have heart, liver, or kidney disease, you need the treatment appropriate for these conditions. General treatment may include: °· Elevation of the affected body part above the level of your heart. °· Compression of the affected body part. Pressure from elastic bandages or support stockings squeezes the tissues and forces fluid back into the blood vessels. This keeps fluid from entering the tissues. °· Restriction of fluid and salt intake. °· Use of a water pill (diuretic). These medications are appropriate only for some types of edema. They pull fluid out of your body and make you urinate more often. This  gets rid of fluid and reduces swelling, but diuretics can have side effects. Only use diuretics as directed by your health care provider. °HOME CARE INSTRUCTIONS  °· Keep the affected body part above the level of your heart when you are lying down.   °· Do not sit still or stand for prolonged periods.   °· Do not put anything directly under your knees when lying down. °· Do not wear constricting clothing or garters on your upper legs.   °· Exercise your legs to work the fluid back into your blood vessels. This may help the swelling go down.   °· Wear elastic bandages or support stockings to reduce ankle swelling as directed by your health care provider.   °· Eat a low-salt diet to reduce fluid if your health care provider recommends it.   °· Only take medicines as directed by your health care provider.  °SEEK MEDICAL CARE IF:  °· Your edema is not responding to treatment. °· You have heart, liver, or kidney disease and notice symptoms of edema. °· You have edema in your legs that does not improve after elevating them.   °· You have sudden and unexplained weight gain. °SEEK IMMEDIATE MEDICAL CARE IF:  °· You develop shortness of breath or chest pain.   °· You cannot breathe when you lie down. °· You develop pain, redness, or warmth in the swollen areas.   °· You have heart, liver, or kidney disease and suddenly get edema. °· You have a fever and your symptoms suddenly get worse. °MAKE SURE YOU:  °·   Understand these instructions. °· Will watch your condition. °· Will get help right away if you are not doing well or get worse. °  °This information is not intended to replace advice given to you by your health care provider. Make sure you discuss any questions you have with your health care provider. °  °Document Released: 11/14/2005 Document Revised: 12/05/2014 Document Reviewed: 09/06/2013 °[employer]vier Interactive Patient Education ©2016 [employer] ° °

## 2016-04-14 NOTE — Progress Notes (Signed)
Pre visit review using our clinic review tool, if applicable. No additional management support is needed unless otherwise documented below in the visit note. 

## 2016-04-14 NOTE — Telephone Encounter (Addendum)
I gave verbals after pt visit to switch to med they sent pt yesterday. Attempted to call after seeing this msg and line was busy

## 2016-04-15 ENCOUNTER — Other Ambulatory Visit: Payer: Self-pay | Admitting: *Deleted

## 2016-04-15 ENCOUNTER — Encounter: Payer: Self-pay | Admitting: Internal Medicine

## 2016-04-15 ENCOUNTER — Telehealth: Payer: Self-pay

## 2016-04-15 DIAGNOSIS — N182 Chronic kidney disease, stage 2 (mild): Secondary | ICD-10-CM

## 2016-04-15 DIAGNOSIS — I5022 Chronic systolic (congestive) heart failure: Secondary | ICD-10-CM

## 2016-04-15 LAB — DIGOXIN LEVEL: Digoxin Level: 1.2 ug/L (ref 0.8–2.0)

## 2016-04-15 NOTE — Patient Outreach (Signed)
[facility]) Care Management  04/15/2016  KI AKERMAN Nov 17, 1925 [id number]   EMMI-heart failure referral- Telephone call to patient who was advised of reason for call & [facility] care management services. Patient gave HIPPA verification.  Dashboard red on 05/17 for new/worsening problem and new/worsening shortness of breath. Patient voices that he was having swelling in legs & shortness of breath on 05/17 and has seen primary care physician on 05/18. States MD increased his diuretics. States he is taking medications as prescribe by MD and has no difficulty obtaining medications.  States major concern currently is his heart failure. States he is weighing himself daily and weight has been the same for last 3 days of 169.8 pounds.  States breathing the same and no change in swelling. States he is currently not on oxygen and uses walker to get around. RN CM reviewed with patient when to notify primary care provider and when to call 911 or have spouse call. Patient voices understanding. Patient was not familiar with heart failure zones.   States he has follow up appointment scheduled with cardiologist in June. States son-in-law takes him to appointments.  Patient voices that he fell 1 week ago and did not get hurt. States he called EMS who checked him out and assisted him.   Recommendations for referral to [place] care coordinator services. Patient consents to services.  Plan:  Referral to care management assistant to refer to community care coordinator for complex case management. Patient recently discharged from hospital (05/5-05/07-2016) Dxs acute renal failure, heart failure, diabetes-insulin dependent. Fell 1 week ago without injury.   Sherrin Daisy, RN BSN [place] Management Coordinator [facility] Care Management  [phone]

## 2016-04-15 NOTE — Telephone Encounter (Signed)
Staff msg sent to M. Stenson regarding lift chair order

## 2016-04-18 ENCOUNTER — Other Ambulatory Visit: Payer: Self-pay | Admitting: *Deleted

## 2016-04-18 ENCOUNTER — Telehealth: Payer: Self-pay | Admitting: Internal Medicine

## 2016-04-18 DIAGNOSIS — R531 Weakness: Secondary | ICD-10-CM

## 2016-04-18 DIAGNOSIS — I5022 Chronic systolic (congestive) heart failure: Secondary | ICD-10-CM

## 2016-04-18 DIAGNOSIS — I2510 Atherosclerotic heart disease of native coronary artery without angina pectoris: Secondary | ICD-10-CM

## 2016-04-18 NOTE — Patient Outreach (Signed)
[facility]) Care Management  04/18/2016  [name] 05/21/1925 [id number]   Patient triggered RED on EMMI Heart Failure dashboard, notification sent to Valente David, RN.  Thanks, Ronnell Freshwater. [place], [place] Assistant Phone: [phone] Fax: [phone]

## 2016-04-18 NOTE — Patient Outreach (Signed)
Referral received from telephonic care manager as member was recently admitted to hospital for acute renal failure.  He was discharged 5/9.  According to chart, he also has history of atrial fibrillation, coronary artery disease, diabetes, congestive heart failure, and BPH.    Call placed to start transition of care, no answer, HIPPA compliant voice message left.  Will make second attempt to contact this week.  Valente David, BSN, [place] Management  [facility] Care Manager [phone]

## 2016-04-18 NOTE — Telephone Encounter (Signed)
[name] [phone]  She is needing verbals for PT  1 week 1 3 week 3 2 week 3  Home Aid 2 week 5  Would like to Send social worker And have the wheelchair been ordered

## 2016-04-19 ENCOUNTER — Other Ambulatory Visit: Payer: Self-pay | Admitting: *Deleted

## 2016-04-19 NOTE — Telephone Encounter (Signed)
yes

## 2016-04-19 NOTE — Telephone Encounter (Signed)
[name] called again about the verbal orders she needed. Please call her back. Thank you.

## 2016-04-19 NOTE — Telephone Encounter (Signed)
Notified Kay w/MD response. She is inquiring about the wheelchair she state they do not do orders must have rx fax to [phone]. She states pt does have the lift chair already. Inform will generate and fax to # given...Johny Chess

## 2016-04-19 NOTE — Telephone Encounter (Signed)
[facility] pt told me home health nurse was going to take care of wheel chair. I have ordered pt's lift chair. PCP agrees to verbal and Education officer, museum

## 2016-04-19 NOTE — Patient Outreach (Addendum)
2nd attempt made to contact member to start transition of care program.  Member verifies identity, this care manager introduces self and explains purpose of call.  He state he is doing "a lot better."  He is able to verbalize reason for admission, stating he has been treated for heart failure, and became dehydrated.  He reports that he has been seen by his PCP since his discharge and has restarted his diuretic, which was placed on hold.  Heart failure zones discussed.  He reports his weight today is 167 pounds.  Discussed the importance of daily weights and assessing extremities for swelling.  He verbalizes understanding.  Patient was recently discharged from hospital and all medications have been reviewed.  Member is agreeable to weekly transition of care calls, also agreeable to initial home visit next week.  Contact information provided, advised to contact with any questions/concerns.  Valente David, BSN, [place] Management  [facility] Care Manager [phone]

## 2016-04-20 ENCOUNTER — Encounter: Payer: Self-pay | Admitting: *Deleted

## 2016-04-20 ENCOUNTER — Other Ambulatory Visit: Payer: Self-pay | Admitting: *Deleted

## 2016-04-20 MED ORDER — TORSEMIDE 10 MG PO TABS: 20.0000 mg | ORAL_TABLET | Freq: Every day | ORAL | Status: AC

## 2016-04-20 NOTE — Telephone Encounter (Signed)
Refax again to [phone]...Johny Chess

## 2016-04-20 NOTE — Telephone Encounter (Signed)
States [name] has not received fax.  Please refax again.

## 2016-04-21 ENCOUNTER — Encounter: Payer: Self-pay | Admitting: Internal Medicine

## 2016-04-21 NOTE — Addendum Note (Signed)
Addended by: Earnstine Regal on: 04/21/2016 02:35 PM   Modules accepted: Orders

## 2016-04-21 NOTE — Telephone Encounter (Signed)
Generated Dme for Standard wheelchair [facility]...Johny Chess

## 2016-04-21 NOTE — Telephone Encounter (Signed)
[name] with [facility] called back in regards (Ph# [phone]) stating [place] has denied Wheel Chair because "manual wheelchair" has to be changed to "standard wheelchair" and under accessories needs to include a wheelchair cushion.  Once fixed can be faxed back to [facility] at [phone]

## 2016-04-22 ENCOUNTER — Telehealth: Payer: Self-pay

## 2016-04-22 NOTE — Telephone Encounter (Signed)
Family called about the wheelchair. They was told it was ready to be picked up and got there and [facility] said they was still waiting on informing form our office. What was sent over yesterday was not the right paper work. One the right paperwork has been sent to [facility] the Family would like to get a call so they know it is ready to be picked up. PLease follow up. And THANK YOU!

## 2016-04-22 NOTE — Telephone Encounter (Signed)
[name] from [place]  They are requesting the wheelchair order today.  Asap.  Is there anything that can be done?

## 2016-04-22 NOTE — Telephone Encounter (Signed)
Sent a staff message to [employer].

## 2016-04-26 ENCOUNTER — Encounter: Payer: Self-pay | Admitting: *Deleted

## 2016-04-26 ENCOUNTER — Other Ambulatory Visit: Payer: Self-pay | Admitting: *Deleted

## 2016-04-26 NOTE — Patient Outreach (Signed)
[facility] [facility]) Care Management   04/26/2016  DWAINE PESQUEIRA [birth year] [id number]  SACARIO REETZ is an 82 y.o. male  Subjective:   Member state that he is doing "alright. I just can't seem to get my legs back.  I'm not strong enough."  He confirms involvement with both physical and occupational therapy through [place].  He and his wife state they have been waiting for a wheelchair to be ordered but has been having difficulty obtaining it.  Member state Dewy Rose, with [facility], has been assisting with this issue.  Wife state that member had an assessment done by hospice/palliative care.  She can't recall the name of the person visiting, but state that the member has decided to be involved with palliative care.  Member reports taking all medications as prescribed.  He also is weighing himself daily, recording readings for comparison of weight gain/loss.  In addition to weights, he also records his blood sugar and blood pressure several times a day.    Objective:   Review of Systems  HENT: Negative.   Eyes: Negative.   Respiratory: Negative.   Cardiovascular: Negative.   Gastrointestinal: Negative.   Genitourinary: Negative.   Musculoskeletal: Negative.   Skin: Negative.   Neurological: Positive for weakness.  Endo/Heme/Allergies: Negative.   Psychiatric/Behavioral: Negative.     Physical Exam  Constitutional: He is oriented to person, place, and time. He appears well-developed and well-nourished.  Neck: Normal range of motion.  Cardiovascular: Normal rate and regular rhythm.   Respiratory: Effort normal and breath sounds normal.  GI: Soft. Bowel sounds are normal.  Musculoskeletal: Normal range of motion.  Neurological: He is alert and oriented to person, place, and time.  Skin: Skin is warm and dry.    BP 102/58 mmHg  Pulse 70  Resp 18  Ht 1.905 m (6\' 3" )  Wt 160 lb 6.4 oz (72.757 kg)  BMI 20.05 kg/m2  SpO2 97%   Encounter Medications:   Outpatient  Encounter Prescriptions as of 04/26/2016  Medication Sig Note  . Amino Acids-Protein Hydrolys (FEEDING SUPPLEMENT, PRO-STAT SUGAR FREE 64,) LIQD Take 30 mLs by mouth 2 (two) times daily.   Marland Kitchen amiodarone (PACERONE) 200 MG tablet Take 1 tablet (200 mg total) by mouth 2 (two) times daily.   Marland Kitchen apixaban (ELIQUIS) 2.5 MG TABS tablet Take 1 tablet (2.5 mg total) by mouth 2 (two) times daily.   . Ascorbic Acid (VITAMIN C) 500 MG tablet Take 500 mg by mouth daily.    Marland Kitchen azelastine (ASTELIN) 0.1 % nasal spray Place 2 sprays into both nostrils 2 (two) times daily. Use in each nostril as directed   . cholecalciferol (VITAMIN D) 1000 units tablet Take 1 tablet (1,000 Units total) by mouth daily.   . digoxin (LANOXIN) 0.125 MG tablet Take 0.5 tablets (0.0625 mg total) by mouth every other day.   . ergocalciferol (VITAMIN D2) 50000 units capsule Take 1 capsule (50,000 Units total) by mouth once a week.   . finasteride (PROSCAR) 5 MG tablet Take 1 tablet (5 mg total) by mouth daily.   . insulin aspart (NOVOLOG FLEXPEN) 100 UNIT/ML FlexPen Inject 3-6 Units into the skin See admin instructions. Use 6 units at breakfast, use 3 units at lunch and use 5 units at supper 04/01/2016: 4 units on 5-5  . levothyroxine (SYNTHROID, LEVOTHROID) 50 MCG tablet Take 1 tablet (50 mcg total) by mouth daily.   . lipase/protease/amylase (CREON) 12000 units CPEP capsule Take 1 capsule by mouth 3  times a day before meals   . metolazone (ZAROXOLYN) 2.5 MG tablet Take 1 tablet (2.5 mg total) by mouth every other day.   . metoprolol tartrate (LOPRESSOR) 25 MG tablet Take 1 tablet (25 mg total) by mouth 2 (two) times daily.   . Multiple Vitamin (MULTIVITAMIN WITH MINERALS) TABS tablet Take 1 tablet by mouth daily.   . tamsulosin (FLOMAX) 0.4 MG CAPS capsule Take 1 capsule (0.4 mg total) by mouth 2 (two) times daily.   Marland Kitchen torsemide (DEMADEX) 10 MG tablet Take 2 tablets (20 mg total) by mouth daily.    No facility-administered encounter  medications on file as of 04/26/2016.    Functional Status:   In your present state of health, do you have any difficulty performing the following activities: 04/26/2016 04/01/2016  Hearing? N N  Vision? N N  Difficulty concentrating or making decisions? N N  Walking or climbing stairs? Y Y  Dressing or bathing? Y N  Doing errands, shopping? Tempie Donning  Preparing Food and eating ? Y -  Using the Toilet? Y -  In the past six months, have you accidently leaked urine? Y -  Do you have problems with loss of bowel control? N -  Managing your Medications? N -  Managing your Finances? N -  Housekeeping or managing your Housekeeping? Y -    Fall/Depression Screening:    PHQ 2/9 Scores 04/26/2016 06/24/2015 04/27/2014 04/25/2014  PHQ - 2 Score 0 0 0 0   Fall Risk  04/26/2016 06/24/2015 04/27/2014 04/25/2014  Falls in the past year? Yes No No No  Number falls in past yr: 2 or more - - -  Injury with Fall? No - - -  Risk Factor Category  High Fall Risk - - -  Risk for fall due to : History of fall(s);Impaired balance/gait - - -  Follow up Education provided;Falls prevention discussed - - -     Assessment:    Discussed heart failure zones with member, aware of when to contact physician.  Reports being in green zone today.  Copy of zones provided with the [facility] calendar book.  Informed of logs for daily self-monitoring .  Patient was recently discharged from hospital and all medications have been reviewed.  Member provided with new pill box.  Old medications found in home, taken and discarded at [facility].  Denies need for pharmacy involvement.  Member/wife denies questions, only concern being obtaining a wheelchair.  This is important because at this time member is not able to go to dining hall for meals.  They are charged $5/meal for delivery to room and $2/meal for wife to take meal up to room.  Made aware that this care manager will follow up with [facility].  Contact information for this care  manager provided, encouraged to contact with concerns.  Call placed to Delphia Grates, to follow up on status of wheelchair.  She report that she has contact Dr. Ronnald Ramp' office several times to have order corrected for insurance approval.  She confirms involvement of PT/OT and social worker.  She also confirms that member has been working with palliative care, Amy.  Call then placed to PCP office to inquire about accurate order for wheelchair.  Spoke to [place], she state that the nurse has been waiting for [place] to provide paperwork/form in order to provide accurate order.  She state that this will be done immediately.  Plan:   Will contact Amy with [facility] to confirm involvement.  If they are involved, this care manager will close case.   Will follow up with member next week.  [facility] CM Care Plan Problem One        Most Recent Value   Care Plan Problem One  Recent Hospitalization   Role Documenting the Problem One  Care Management Coordinator   Care Plan for Problem One  Active   THN Long Term Goal (31-90 days)  Member will not have readmission within the next 45 days   THN Long Term Goal Start Date  04/19/16   Interventions for Problem One Long Term Goal  Discussed reason for admission, importance of following all discharge instructions, including diet, medication management, follow up appointments, and involvement with home health, and how it all relates to decreasing the risk of readmission   THN CM Short Term Goal #1 (0-30 days)  Member wil report compliance with medications over the next 4 weeks   THN CM Short Term Goal #1 Start Date  04/19/16   Interventions for Short Term Goal #1  Discussed importance of following physician instructions for medication management.  Educated on when to contact physician with weight change in effort to have medications altered   THN CM Short Term Goal #2 (0-30 days)  Member will report weighing daily, and recording,  over the next 4 weeks   THN CM Short Term Goal #2 Start Date  04/19/16   Interventions for Short Term Goal #2  Discussed importance of following physician instructions for medication management.  Educated on when to contact physician with weight change in effort to have medications altered     Valente David, BSN, [place] [phone]

## 2016-04-27 NOTE — Telephone Encounter (Signed)
Pt son in law called.  Informed that we received the paperwork to have the wheelchair ordered today.  Call back [phone] Verne Spurr (son-in-law) when we have faxed the form and OV.

## 2016-04-27 NOTE — Telephone Encounter (Signed)
Received signed orders from PCP. Faxed order, form, and OV notes to [place] and [facility] both.   lvm with son-in-law Jonni Sanger) stating same above.

## 2016-04-28 ENCOUNTER — Other Ambulatory Visit: Payer: Self-pay

## 2016-04-28 DIAGNOSIS — R600 Localized edema: Secondary | ICD-10-CM

## 2016-04-28 DIAGNOSIS — I5022 Chronic systolic (congestive) heart failure: Secondary | ICD-10-CM

## 2016-04-28 DIAGNOSIS — I872 Venous insufficiency (chronic) (peripheral): Secondary | ICD-10-CM

## 2016-04-28 MED ORDER — METOLAZONE 2.5 MG PO TABS: 2.5000 mg | ORAL_TABLET | ORAL | Status: DC

## 2016-04-29 ENCOUNTER — Other Ambulatory Visit: Payer: Self-pay | Admitting: *Deleted

## 2016-04-29 NOTE — Patient Outreach (Signed)
Call placed to [facility] to inquire about involvement with member.  Message left, will await call back.  If member is involved, will close case.  Kristyne Woodring, RN, MSN [facility] Care Management  Community Care Manager [phone] 

## 2016-05-02 ENCOUNTER — Ambulatory Visit: Payer: Medicare Other | Admitting: Internal Medicine

## 2016-05-03 ENCOUNTER — Ambulatory Visit: Payer: Self-pay | Admitting: *Deleted

## 2016-05-03 ENCOUNTER — Other Ambulatory Visit: Payer: Self-pay | Admitting: *Deleted

## 2016-05-03 NOTE — Patient Outreach (Signed)
Weekly transition of care call placed to member, no answer, unable to leave a message.  Will await call back, if no call back, will follow up next week.  Valente David, [place], MSN [place] [phone]

## 2016-05-04 ENCOUNTER — Telehealth: Payer: Self-pay

## 2016-05-04 DIAGNOSIS — R531 Weakness: Secondary | ICD-10-CM

## 2016-05-04 DIAGNOSIS — R296 Repeated falls: Secondary | ICD-10-CM

## 2016-05-04 DIAGNOSIS — R269 Unspecified abnormalities of gait and mobility: Secondary | ICD-10-CM | POA: Diagnosis not present

## 2016-05-04 DIAGNOSIS — I5022 Chronic systolic (congestive) heart failure: Secondary | ICD-10-CM

## 2016-05-04 NOTE — Telephone Encounter (Signed)
Home Health Cert/Plan of Care received (04/12/2016 - 06/10/2016) and placed on MD's desk for signature

## 2016-05-04 NOTE — Telephone Encounter (Signed)
Paperwork signed, faxed, copy sent to scan 

## 2016-05-09 NOTE — Patient Outreach (Signed)
[place] [facility]) Care Management  05/09/2016  [name] 12/15/1924 [id number]   Patient triggered red on EMMI Heart Failure dashboards. Notification sent to [facility], RN.   Josepha Pigg

## 2016-05-10 ENCOUNTER — Ambulatory Visit (INDEPENDENT_AMBULATORY_CARE_PROVIDER_SITE_OTHER): Payer: Medicare Other | Admitting: Internal Medicine

## 2016-05-10 ENCOUNTER — Other Ambulatory Visit (INDEPENDENT_AMBULATORY_CARE_PROVIDER_SITE_OTHER): Payer: Medicare Other

## 2016-05-10 ENCOUNTER — Other Ambulatory Visit: Payer: Self-pay | Admitting: *Deleted

## 2016-05-10 ENCOUNTER — Telehealth: Payer: Self-pay | Admitting: *Deleted

## 2016-05-10 ENCOUNTER — Encounter: Payer: Self-pay | Admitting: Internal Medicine

## 2016-05-10 VITALS — BP 90/60 | HR 62 | Temp 97.7°F | Resp 16 | Ht 75.0 in | Wt 167.0 lb

## 2016-05-10 DIAGNOSIS — I472 Ventricular tachycardia: Secondary | ICD-10-CM

## 2016-05-10 DIAGNOSIS — D638 Anemia in other chronic diseases classified elsewhere: Secondary | ICD-10-CM

## 2016-05-10 DIAGNOSIS — E038 Other specified hypothyroidism: Secondary | ICD-10-CM

## 2016-05-10 DIAGNOSIS — I4729 Other ventricular tachycardia: Secondary | ICD-10-CM

## 2016-05-10 DIAGNOSIS — I5022 Chronic systolic (congestive) heart failure: Secondary | ICD-10-CM

## 2016-05-10 DIAGNOSIS — T460X4A Poisoning by cardiac-stimulant glycosides and drugs of similar action, undetermined, initial encounter: Secondary | ICD-10-CM

## 2016-05-10 DIAGNOSIS — E878 Other disorders of electrolyte and fluid balance, not elsewhere classified: Secondary | ICD-10-CM

## 2016-05-10 DIAGNOSIS — Z794 Long term (current) use of insulin: Secondary | ICD-10-CM

## 2016-05-10 DIAGNOSIS — I2510 Atherosclerotic heart disease of native coronary artery without angina pectoris: Secondary | ICD-10-CM | POA: Diagnosis not present

## 2016-05-10 DIAGNOSIS — E118 Type 2 diabetes mellitus with unspecified complications: Secondary | ICD-10-CM

## 2016-05-10 LAB — CBC WITH DIFFERENTIAL/PLATELET
Basophils Absolute: 0 10*3/uL (ref 0.0–0.1)
Basophils Relative: 0.3 % (ref 0.0–3.0)
EOS PCT: 0 % (ref 0.0–5.0)
Eosinophils Absolute: 0 10*3/uL (ref 0.0–0.7)
HCT: 33.2 % — ABNORMAL LOW (ref 39.0–52.0)
HEMOGLOBIN: 11.3 g/dL — AB (ref 13.0–17.0)
LYMPHS ABS: 0.7 10*3/uL (ref 0.7–4.0)
Lymphocytes Relative: 11.3 % — ABNORMAL LOW (ref 12.0–46.0)
MCHC: 34 g/dL (ref 30.0–36.0)
MCV: 96 fl (ref 78.0–100.0)
MONOS PCT: 16.3 % — AB (ref 3.0–12.0)
Monocytes Absolute: 1 10*3/uL (ref 0.1–1.0)
NEUTROS PCT: 72.1 % (ref 43.0–77.0)
Neutro Abs: 4.2 10*3/uL (ref 1.4–7.7)
Platelets: 114 10*3/uL — ABNORMAL LOW (ref 150.0–400.0)
RBC: 3.45 Mil/uL — AB (ref 4.22–5.81)
RDW: 16.9 % — ABNORMAL HIGH (ref 11.5–15.5)
WBC: 5.9 10*3/uL (ref 4.0–10.5)

## 2016-05-10 LAB — THYROID PANEL WITH TSH
FREE THYROXINE INDEX: 2.3 (ref 1.4–3.8)
T3 UPTAKE: 40 % — AB (ref 22–35)
T4, Total: 5.8 ug/dL (ref 4.5–12.0)
TSH: 7.43 m[IU]/L — AB (ref 0.40–4.50)

## 2016-05-10 LAB — BASIC METABOLIC PANEL
BUN: 96 mg/dL (ref 6–23)
CALCIUM: 8.2 mg/dL — AB (ref 8.4–10.5)
CHLORIDE: 83 meq/L — AB (ref 96–112)
CO2: 38 meq/L — AB (ref 19–32)
Creatinine, Ser: 3.21 mg/dL — ABNORMAL HIGH (ref 0.40–1.50)
GFR: 19.47 mL/min — ABNORMAL LOW (ref >60.00)
GLUCOSE: 121 mg/dL — AB (ref 70–99)
Potassium: 2.8 mEq/L — CL (ref 3.5–5.1)
Sodium: 129 mEq/L — ABNORMAL LOW (ref 135–145)

## 2016-05-10 LAB — HEMOGLOBIN A1C: Hgb A1c MFr Bld: 6.1 % (ref 4.6–6.5)

## 2016-05-10 MED ORDER — POTASSIUM CHLORIDE ER 8 MEQ PO TBCR: 8.0000 meq | EXTENDED_RELEASE_TABLET | Freq: Three times a day (TID) | ORAL | Status: AC

## 2016-05-10 MED ORDER — SPIRONOLACTONE 25 MG PO TABS
25.0000 mg | ORAL_TABLET | Freq: Every day | ORAL | Status: AC
Start: 2016-05-10 — End: ?

## 2016-05-10 NOTE — Patient Outreach (Signed)
Notified by care management assistant that member triggered red on the [facility] dashboard for congestive heart failure.  Call placed to member to follow up on current status, no answer.  HIPPA compliant voice message left.  Will await call back.  After call was placed, noted that member is currently at his PCP office for a hospital follow up.  Will follow up with member later this week.  Valente David, [place], MSN [place] [phone]

## 2016-05-10 NOTE — Progress Notes (Signed)
Pre visit review using our clinic review tool, if applicable. No additional management support is needed unless otherwise documented below in the visit note. 

## 2016-05-10 NOTE — Telephone Encounter (Signed)
[facility] called- Pt's Kcl is critical at 2.8. BUN is 96.

## 2016-05-10 NOTE — Telephone Encounter (Signed)
Left msg on triage wanting to verify mg & instructions on the spironolactone & Potassium she states especially the K normally not suppose to be taking together esp w/ high msg on potassium.Marland KitchenJohny Hoffman

## 2016-05-10 NOTE — Telephone Encounter (Signed)
I am aware of the concern for high potassium level He will come back in 2-3 weeks for me to recheck his potassium level

## 2016-05-10 NOTE — Progress Notes (Signed)
Subjective:  Patient ID: [name], male    DOB: Oct 17, 1925  Age: 82 y.o. MRN: [id number]  CC: Hypothyroidism and Congestive Heart Failure   HPI YUVRAJ LEIMAN presents for f/up on CHF, DM, and hypoT. Since I last saw him he tells me that in general he feels better. His shortness of breath and cough has resolved. He is concerned about persistent edema in his lower extremities, DOE and fatigue. He complains of generalized weakness but no numbness or tingling.  Outpatient Prescriptions Prior to Visit  Medication Sig Dispense Refill  . Amino Acids-Protein Hydrolys (FEEDING SUPPLEMENT, PRO-STAT SUGAR FREE 64,) LIQD Take 30 mLs by mouth 2 (two) times daily.    Marland Kitchen amiodarone (PACERONE) 200 MG tablet Take 1 tablet (200 mg total) by mouth 2 (two) times daily. 180 tablet 2  . apixaban (ELIQUIS) 2.5 MG TABS tablet Take 1 tablet (2.5 mg total) by mouth 2 (two) times daily. 180 tablet 2  . Ascorbic Acid (VITAMIN C) 500 MG tablet Take 500 mg by mouth daily.     Marland Kitchen azelastine (ASTELIN) 0.1 % nasal spray Place 2 sprays into both nostrils 2 (two) times daily. Use in each nostril as directed 90 mL 3  . digoxin (LANOXIN) 0.125 MG tablet Take 0.5 tablets (0.0625 mg total) by mouth every other day. 30 tablet 0  . ergocalciferol (VITAMIN D2) 50000 units capsule Take 1 capsule (50,000 Units total) by mouth once a week. 6 capsule 0  . finasteride (PROSCAR) 5 MG tablet Take 1 tablet (5 mg total) by mouth daily. 90 tablet 3  . insulin aspart (NOVOLOG FLEXPEN) 100 UNIT/ML FlexPen Inject 3-6 Units into the skin See admin instructions. Use 6 units at breakfast, use 3 units at lunch and use 5 units at supper    . lipase/protease/amylase (CREON) 12000 units CPEP capsule Take 1 capsule by mouth 3  times a day before meals 300 capsule 3  . metoprolol tartrate (LOPRESSOR) 25 MG tablet Take 1 tablet (25 mg total) by mouth 2 (two) times daily. 180 tablet 3  . Multiple Vitamin (MULTIVITAMIN WITH MINERALS) TABS tablet Take 1  tablet by mouth daily.    . tamsulosin (FLOMAX) 0.4 MG CAPS capsule Take 1 capsule (0.4 mg total) by mouth 2 (two) times daily. 180 capsule 3  . torsemide (DEMADEX) 10 MG tablet Take 2 tablets (20 mg total) by mouth daily. 180 tablet 1  . levothyroxine (SYNTHROID, LEVOTHROID) 50 MCG tablet Take 1 tablet (50 mcg total) by mouth daily. 90 tablet 1  . metolazone (ZAROXOLYN) 2.5 MG tablet Take 1 tablet (2.5 mg total) by mouth every other day. 90 tablet 1  . cholecalciferol (VITAMIN D) 1000 units tablet Take 1 tablet (1,000 Units total) by mouth daily. (Patient not taking: Reported on 04/26/2016) 100 tablet 3   No facility-administered medications prior to visit.    ROS Review of Systems  Constitutional: Positive for fatigue and unexpected weight change (wt gain). Negative for fever, chills, diaphoresis and appetite change.  HENT: Negative.   Eyes: Negative.  Negative for visual disturbance.  Respiratory: Negative.  Negative for cough, choking, chest tightness, shortness of breath, wheezing and stridor.   Cardiovascular: Positive for leg swelling (2+ pitting edema in BLE). Negative for chest pain and palpitations.  Gastrointestinal: Negative.  Negative for nausea, vomiting, abdominal pain and constipation.  Endocrine: Negative.  Negative for polydipsia, polyphagia and polyuria.  Genitourinary: Negative.  Negative for difficulty urinating.  Musculoskeletal: Negative.  Negative for myalgias, back pain  and neck pain.  Skin: Negative.  Negative for color change and pallor.  Allergic/Immunologic: Negative.   Neurological: Negative.  Negative for dizziness, tremors, facial asymmetry, weakness, light-headedness and headaches.  Hematological: Negative.  Negative for adenopathy. Does not bruise/bleed easily.  Psychiatric/Behavioral: Negative.     Objective:  BP 90/60 mmHg  Pulse 62  Temp(Src) 97.7 F (36.5 C) (Oral)  Resp 16  Ht 6\' 3"  (1.905 m)  Wt 167 lb (75.751 kg)  BMI 20.87 kg/m2  SpO2  97%  BP Readings from Last 3 Encounters:  05/10/16 90/60  04/26/16 102/58  04/14/16 96/58    Wt Readings from Last 3 Encounters:  05/10/16 167 lb (75.751 kg)  04/26/16 160 lb 6.4 oz (72.757 kg)  04/08/16 169 lb (76.658 kg)    Physical Exam  Constitutional: He is oriented to person, place, and time. No distress.  HENT:  Mouth/Throat: Oropharynx is clear and moist. No oropharyngeal exudate.  Eyes: Conjunctivae are normal. Right eye exhibits no discharge. Left eye exhibits no discharge. No scleral icterus.  Neck: Normal range of motion. Neck supple. No JVD present. No tracheal deviation present. No thyromegaly present.  Cardiovascular: Normal rate, regular rhythm, normal heart sounds and intact distal pulses.  Exam reveals no gallop and no friction rub.   No murmur heard. Pulmonary/Chest: Effort normal and breath sounds normal. No stridor. No respiratory distress. He has no wheezes. He has no rales. He exhibits no tenderness.  Abdominal: Soft. Bowel sounds are normal. He exhibits no distension and no mass. There is no tenderness. There is no rebound and no guarding.  Musculoskeletal: Normal range of motion. He exhibits edema (2+ pitting edema in BLE). He exhibits no tenderness.  Lymphadenopathy:    He has no cervical adenopathy.  Neurological: He is oriented to person, place, and time.  Skin: Skin is warm and dry. No rash noted. He is not diaphoretic. No erythema. No pallor.  Vitals reviewed.   Lab Results  Component Value Date   WBC 5.9 05/10/2016   HGB 11.3* 05/10/2016   HCT 33.2* 05/10/2016   PLT 114.0* 05/10/2016   GLUCOSE 121* 05/10/2016   CHOL 114 10/21/2015   TRIG 59.0 10/21/2015   HDL 46.20 10/21/2015   LDLCALC 56 10/21/2015   ALT 53 04/14/2016   AST 58* 04/14/2016   NA 129* 05/10/2016   K 2.8 Repeated and verified X2.* 05/10/2016   CL 83* 05/10/2016   CREATININE 3.21* 05/10/2016   BUN 96 Repeated and verified X2.* 05/10/2016   CO2 38* 05/10/2016   TSH 7.43*  05/10/2016   PSA 2.28 01/04/2010   INR 1.65* 04/01/2016   HGBA1C 6.1 05/10/2016   MICROALBUR <0.7 02/25/2015    Dg Chest 2 View  04/01/2016  CLINICAL DATA:  Weakness, dehydration, shortness of breath. EXAM: CHEST  2 VIEW COMPARISON:  03/30/2016 FINDINGS: Retained catheter fragment again noted within the right lung, stable. Mild cardiomegaly is stable. Stable consolidation in the right mid and lower lung with small right effusion. Increasing left basilar opacity, likely atelectasis. No acute bony abnormality. IMPRESSION: Stable right mid and lower lung opacity with small right effusion. Increasing left base atelectasis. Electronically Signed   By: Rolm Baptise M.D.   On: 04/01/2016 11:31    Assessment & Plan:   Lyndol was seen today for hypothyroidism and congestive heart failure.  Diagnoses and all orders for this visit:  Chronic systolic CHF (congestive heart failure) ([place])- in some ways it appears that his heart failure has improved though he  has persistent edema, some weight gain and other troublesome symptoms. He has now developed profound hypokalemia so will discontinue one of his loop diuretics and instead will switch to spironolactone which may long-term be better for heart failure and help sustain a normal potassium level. -     Basic metabolic panel; Future -     Digoxin level; Future -     spironolactone (ALDACTONE) 25 MG tablet; Take 1 tablet (25 mg total) by mouth daily. -     potassium chloride (KLOR-CON) 8 MEQ tablet; Take 1 tablet (8 mEq total) by mouth 3 (three) times daily.  Paroxysmal VT-proarrhythmia 2/2 tikosyn- he has had no recent palpitations and is in sinus rhythm today, will continue the current amiodarone to maintain sinus rhythm -     Digoxin level; Future  Other specified hypothyroidism- his TSH remains moderately elevated and he has persistent symptoms so I decided to increase his levothyroxine dose from 50 g to 75 g a day -     Thyroid Panel With TSH;  Future -     levothyroxine (SYNTHROID, LEVOTHROID) 75 MCG tablet; Take 1 tablet (75 mcg total) by mouth daily.  Digoxin toxicity, undetermined intent, initial encounter- his digoxin level is in the normal range. -     Basic metabolic panel; Future -     Digoxin level; Future  Anemia of chronic disease- this is stable and requires no treatment at this time -     CBC with Differential/Platelet; Future  Electrolyte disturbance- he continues to be prerenal with renal insufficiency which we must except to adequately treat the congestive heart failure and fluid overload, however he has developed profound hypokalemia so will discontinue one of his loop diuretics and switch to a potassium sparing diuretic as well as a potassium supplement, I've asked him to return in 2 weeks to keep a close eye on the potassium level -     Basic metabolic panel; Future -     spironolactone (ALDACTONE) 25 MG tablet; Take 1 tablet (25 mg total) by mouth daily. -     potassium chloride (KLOR-CON) 8 MEQ tablet; Take 1 tablet (8 mEq total) by mouth 3 (three) times daily.  Type 2 diabetes mellitus with complication, with long-term current use of insulin ([place])- his blood sugar is adequately well controlled -     Hemoglobin A1c; Future   I have discontinued Mr. Winnie levothyroxine and metolazone. I am also having him start on spironolactone, potassium chloride, and levothyroxine. Additionally, I am having him maintain his vitamin C, tamsulosin, finasteride, azelastine, amiodarone, metoprolol tartrate, insulin aspart, feeding supplement (PRO-STAT SUGAR FREE 64), multivitamin with minerals, apixaban, lipase/protease/amylase, digoxin, cholecalciferol, ergocalciferol, and torsemide.  Meds ordered this encounter  Medications  . spironolactone (ALDACTONE) 25 MG tablet    Sig: Take 1 tablet (25 mg total) by mouth daily.    Dispense:  90 tablet    Refill:  1  . potassium chloride (KLOR-CON) 8 MEQ tablet    Sig: Take 1 tablet  (8 mEq total) by mouth 3 (three) times daily.    Dispense:  90 tablet    Refill:  2  . levothyroxine (SYNTHROID, LEVOTHROID) 75 MCG tablet    Sig: Take 1 tablet (75 mcg total) by mouth daily.    Dispense:  90 tablet    Refill:  1     Follow-up: Return in about 3 months (around 08/10/2016).  [name], MD

## 2016-05-10 NOTE — Telephone Encounter (Signed)
Notified pharmacist Velta Addison w/MD response...[name]

## 2016-05-10 NOTE — Patient Instructions (Signed)

## 2016-05-11 ENCOUNTER — Telehealth: Payer: Self-pay | Admitting: Internal Medicine

## 2016-05-11 ENCOUNTER — Encounter: Payer: Self-pay | Admitting: Internal Medicine

## 2016-05-11 LAB — DIGOXIN LEVEL: Digoxin Level: 0.8 ug/L (ref 0.8–2.0)

## 2016-05-11 MED ORDER — LEVOTHYROXINE SODIUM 75 MCG PO TABS: 75.0000 ug | ORAL_TABLET | Freq: Every day | ORAL | Status: AC

## 2016-05-11 NOTE — Telephone Encounter (Signed)
[name] is looking for verbals on OT  2 week/ 4  starting 05/15/2016

## 2016-05-11 NOTE — Telephone Encounter (Signed)
Verbal given 

## 2016-05-12 ENCOUNTER — Encounter: Payer: Self-pay | Admitting: Internal Medicine

## 2016-05-13 ENCOUNTER — Other Ambulatory Visit: Payer: Self-pay | Admitting: *Deleted

## 2016-05-13 ENCOUNTER — Other Ambulatory Visit: Payer: Self-pay | Admitting: Internal Medicine

## 2016-05-13 DIAGNOSIS — N179 Acute kidney failure, unspecified: Secondary | ICD-10-CM

## 2016-05-13 DIAGNOSIS — E878 Other disorders of electrolyte and fluid balance, not elsewhere classified: Secondary | ICD-10-CM

## 2016-05-13 NOTE — Patient Outreach (Signed)
Weekly transition of care call placed to member.  He state he is doing "fairly well" but has been tired over the past couple days.  He denies any shortness of breath or chest discomfort.  He reports some medication changes in attempt to better manage his heart failure and potassium levels, denies any questions or issues with medication compliance.  He continues to weigh himself daily, stating he's "still around 167 pounds."  He continue to have occupational therapy provide visits, is not sure when this program will end.  He does confirm he has received his wheelchair.    Made aware that the transition of care program has been completed, this care manager inquired about additional services provided by [facility] (case management vs disease management with health coach).  He denies needing any further involvement.  He is a resident at a senior living facility where meals are provided, wife is still able to provide assistance as needed.  He is in constant communication with his providers, denies concern regarding health condition or medication management.  He consistently check his blood pressure and blood sugar along with his weights, is aware of when to contact the physician.  Member is not sure when palliative care will make another visit, but has reported involvement with them.  This care manager will follow up with [facility] to confirm member is active.  Since he denies the need for further involvement, will close case and notify PCP and care management assistant.  Valente David, [place], MSN [place] [phone]

## 2016-05-14 ENCOUNTER — Other Ambulatory Visit: Payer: Self-pay

## 2016-05-14 ENCOUNTER — Emergency Department (HOSPITAL_COMMUNITY): Payer: Medicare Other

## 2016-05-14 ENCOUNTER — Inpatient Hospital Stay (HOSPITAL_COMMUNITY)
Admission: EM | Admit: 2016-05-14 | Discharge: 2016-05-28 | DRG: 291 | Disposition: E | Payer: Medicare Other | Attending: Internal Medicine | Admitting: Internal Medicine

## 2016-05-14 ENCOUNTER — Encounter (HOSPITAL_COMMUNITY): Payer: Self-pay

## 2016-05-14 DIAGNOSIS — Z794 Long term (current) use of insulin: Secondary | ICD-10-CM | POA: Diagnosis not present

## 2016-05-14 DIAGNOSIS — K219 Gastro-esophageal reflux disease without esophagitis: Secondary | ICD-10-CM | POA: Diagnosis present

## 2016-05-14 DIAGNOSIS — I130 Hypertensive heart and chronic kidney disease with heart failure and stage 1 through stage 4 chronic kidney disease, or unspecified chronic kidney disease: Secondary | ICD-10-CM | POA: Diagnosis not present

## 2016-05-14 DIAGNOSIS — I5023 Acute on chronic systolic (congestive) heart failure: Secondary | ICD-10-CM | POA: Diagnosis not present

## 2016-05-14 DIAGNOSIS — N184 Chronic kidney disease, stage 4 (severe): Secondary | ICD-10-CM | POA: Diagnosis present

## 2016-05-14 DIAGNOSIS — J9601 Acute respiratory failure with hypoxia: Secondary | ICD-10-CM | POA: Diagnosis present

## 2016-05-14 DIAGNOSIS — E860 Dehydration: Secondary | ICD-10-CM | POA: Diagnosis present

## 2016-05-14 DIAGNOSIS — E871 Hypo-osmolality and hyponatremia: Secondary | ICD-10-CM | POA: Diagnosis present

## 2016-05-14 DIAGNOSIS — Z66 Do not resuscitate: Secondary | ICD-10-CM | POA: Diagnosis present

## 2016-05-14 DIAGNOSIS — Z79899 Other long term (current) drug therapy: Secondary | ICD-10-CM

## 2016-05-14 DIAGNOSIS — Z87891 Personal history of nicotine dependence: Secondary | ICD-10-CM | POA: Diagnosis not present

## 2016-05-14 DIAGNOSIS — E1122 Type 2 diabetes mellitus with diabetic chronic kidney disease: Secondary | ICD-10-CM | POA: Diagnosis present

## 2016-05-14 DIAGNOSIS — Z903 Acquired absence of stomach [part of]: Secondary | ICD-10-CM

## 2016-05-14 DIAGNOSIS — E872 Acidosis, unspecified: Secondary | ICD-10-CM

## 2016-05-14 DIAGNOSIS — Z90411 Acquired partial absence of pancreas: Secondary | ICD-10-CM

## 2016-05-14 DIAGNOSIS — R001 Bradycardia, unspecified: Secondary | ICD-10-CM | POA: Diagnosis present

## 2016-05-14 DIAGNOSIS — Z6821 Body mass index (BMI) 21.0-21.9, adult: Secondary | ICD-10-CM

## 2016-05-14 DIAGNOSIS — E8809 Other disorders of plasma-protein metabolism, not elsewhere classified: Secondary | ICD-10-CM | POA: Diagnosis present

## 2016-05-14 DIAGNOSIS — I5022 Chronic systolic (congestive) heart failure: Secondary | ICD-10-CM | POA: Diagnosis not present

## 2016-05-14 DIAGNOSIS — Z8509 Personal history of malignant neoplasm of other digestive organs: Secondary | ICD-10-CM

## 2016-05-14 DIAGNOSIS — Z7901 Long term (current) use of anticoagulants: Secondary | ICD-10-CM

## 2016-05-14 DIAGNOSIS — E878 Other disorders of electrolyte and fluid balance, not elsewhere classified: Secondary | ICD-10-CM | POA: Diagnosis not present

## 2016-05-14 DIAGNOSIS — Z9981 Dependence on supplemental oxygen: Secondary | ICD-10-CM | POA: Diagnosis not present

## 2016-05-14 DIAGNOSIS — K8689 Other specified diseases of pancreas: Secondary | ICD-10-CM | POA: Diagnosis present

## 2016-05-14 DIAGNOSIS — I4729 Other ventricular tachycardia: Secondary | ICD-10-CM

## 2016-05-14 DIAGNOSIS — J690 Pneumonitis due to inhalation of food and vomit: Secondary | ICD-10-CM | POA: Diagnosis present

## 2016-05-14 DIAGNOSIS — Z8589 Personal history of malignant neoplasm of other organs and systems: Secondary | ICD-10-CM | POA: Diagnosis not present

## 2016-05-14 DIAGNOSIS — N183 Chronic kidney disease, stage 3 unspecified: Secondary | ICD-10-CM

## 2016-05-14 DIAGNOSIS — I5043 Acute on chronic combined systolic (congestive) and diastolic (congestive) heart failure: Secondary | ICD-10-CM | POA: Diagnosis present

## 2016-05-14 DIAGNOSIS — I2510 Atherosclerotic heart disease of native coronary artery without angina pectoris: Secondary | ICD-10-CM | POA: Diagnosis present

## 2016-05-14 DIAGNOSIS — I428 Other cardiomyopathies: Secondary | ICD-10-CM | POA: Diagnosis present

## 2016-05-14 DIAGNOSIS — I481 Persistent atrial fibrillation: Secondary | ICD-10-CM | POA: Diagnosis present

## 2016-05-14 DIAGNOSIS — Z881 Allergy status to other antibiotic agents status: Secondary | ICD-10-CM

## 2016-05-14 DIAGNOSIS — Z515 Encounter for palliative care: Secondary | ICD-10-CM | POA: Diagnosis not present

## 2016-05-14 DIAGNOSIS — Z888 Allergy status to other drugs, medicaments and biological substances status: Secondary | ICD-10-CM | POA: Diagnosis not present

## 2016-05-14 DIAGNOSIS — I951 Orthostatic hypotension: Secondary | ICD-10-CM | POA: Diagnosis present

## 2016-05-14 DIAGNOSIS — E118 Type 2 diabetes mellitus with unspecified complications: Secondary | ICD-10-CM | POA: Diagnosis present

## 2016-05-14 DIAGNOSIS — Z880 Allergy status to penicillin: Secondary | ICD-10-CM | POA: Diagnosis not present

## 2016-05-14 DIAGNOSIS — Z9842 Cataract extraction status, left eye: Secondary | ICD-10-CM | POA: Diagnosis not present

## 2016-05-14 DIAGNOSIS — Z961 Presence of intraocular lens: Secondary | ICD-10-CM | POA: Diagnosis present

## 2016-05-14 DIAGNOSIS — R0602 Shortness of breath: Secondary | ICD-10-CM

## 2016-05-14 DIAGNOSIS — E876 Hypokalemia: Secondary | ICD-10-CM | POA: Diagnosis present

## 2016-05-14 DIAGNOSIS — R627 Adult failure to thrive: Secondary | ICD-10-CM | POA: Diagnosis present

## 2016-05-14 DIAGNOSIS — Z9841 Cataract extraction status, right eye: Secondary | ICD-10-CM

## 2016-05-14 DIAGNOSIS — N179 Acute kidney failure, unspecified: Secondary | ICD-10-CM | POA: Diagnosis present

## 2016-05-14 DIAGNOSIS — N400 Enlarged prostate without lower urinary tract symptoms: Secondary | ICD-10-CM | POA: Diagnosis present

## 2016-05-14 DIAGNOSIS — I482 Chronic atrial fibrillation: Secondary | ICD-10-CM | POA: Diagnosis present

## 2016-05-14 DIAGNOSIS — R55 Syncope and collapse: Secondary | ICD-10-CM

## 2016-05-14 DIAGNOSIS — E039 Hypothyroidism, unspecified: Secondary | ICD-10-CM | POA: Diagnosis present

## 2016-05-14 DIAGNOSIS — Z885 Allergy status to narcotic agent status: Secondary | ICD-10-CM

## 2016-05-14 DIAGNOSIS — I959 Hypotension, unspecified: Secondary | ICD-10-CM

## 2016-05-14 DIAGNOSIS — I472 Ventricular tachycardia: Secondary | ICD-10-CM | POA: Diagnosis not present

## 2016-05-14 LAB — BASIC METABOLIC PANEL
ANION GAP: 11 (ref 5–15)
BUN: 103 mg/dL — ABNORMAL HIGH (ref 6–20)
CALCIUM: 8.1 mg/dL — AB (ref 8.9–10.3)
CO2: 31 mmol/L (ref 22–32)
Chloride: 83 mmol/L — ABNORMAL LOW (ref 101–111)
Creatinine, Ser: 3.99 mg/dL — ABNORMAL HIGH (ref 0.61–1.24)
GFR, EST AFRICAN AMERICAN: 14 mL/min — AB (ref >60)
GFR, EST NON AFRICAN AMERICAN: 12 mL/min — AB (ref >60)
Glucose, Bld: 124 mg/dL — ABNORMAL HIGH (ref 65–99)
Potassium: 4 mmol/L (ref 3.5–5.1)
SODIUM: 125 mmol/L — AB (ref 135–145)

## 2016-05-14 LAB — URINE MICROSCOPIC-ADD ON

## 2016-05-14 LAB — CBG MONITORING, ED: GLUCOSE-CAPILLARY: 116 mg/dL — AB (ref 65–99)

## 2016-05-14 LAB — GLUCOSE, CAPILLARY
GLUCOSE-CAPILLARY: 179 mg/dL — AB (ref 65–99)
Glucose-Capillary: 139 mg/dL — ABNORMAL HIGH (ref 65–99)

## 2016-05-14 LAB — CBC
HCT: 33.4 % — ABNORMAL LOW (ref 39.0–52.0)
Hemoglobin: 11.1 g/dL — ABNORMAL LOW (ref 13.0–17.0)
MCH: 31.6 pg (ref 26.0–34.0)
MCHC: 33.2 g/dL (ref 30.0–36.0)
MCV: 95.2 fL (ref 78.0–100.0)
PLATELETS: 119 10*3/uL — AB (ref 150–400)
RBC: 3.51 MIL/uL — AB (ref 4.22–5.81)
RDW: 16.6 % — ABNORMAL HIGH (ref 11.5–15.5)
WBC: 6.6 10*3/uL (ref 4.0–10.5)

## 2016-05-14 LAB — URINALYSIS, ROUTINE W REFLEX MICROSCOPIC
BILIRUBIN URINE: NEGATIVE
GLUCOSE, UA: NEGATIVE mg/dL
HGB URINE DIPSTICK: NEGATIVE
Ketones, ur: NEGATIVE mg/dL
Nitrite: NEGATIVE
PROTEIN: 30 mg/dL — AB
SPECIFIC GRAVITY, URINE: 1.016 (ref 1.005–1.030)
pH: 5.5 (ref 5.0–8.0)

## 2016-05-14 LAB — MAGNESIUM: MAGNESIUM: 2.6 mg/dL — AB (ref 1.7–2.4)

## 2016-05-14 LAB — PROTIME-INR
INR: 1.62 — AB (ref 0.00–1.49)
Prothrombin Time: 19.3 seconds — ABNORMAL HIGH (ref 11.6–15.2)

## 2016-05-14 LAB — I-STAT CG4 LACTIC ACID, ED
Lactic Acid, Venous: 2.71 mmol/L (ref 0.5–2.0)
Lactic Acid, Venous: 1.7 mmol/L (ref 0.5–2.0)

## 2016-05-14 LAB — T4, FREE: FREE T4: 1.4 ng/dL — AB (ref 0.61–1.12)

## 2016-05-14 LAB — TSH: TSH: 7.627 u[IU]/mL — ABNORMAL HIGH (ref 0.350–4.500)

## 2016-05-14 LAB — DIGOXIN LEVEL: Digoxin Level: 1.2 ng/mL (ref 0.8–2.0)

## 2016-05-14 LAB — I-STAT TROPONIN, ED: TROPONIN I, POC: 0.06 ng/mL (ref 0.00–0.08)

## 2016-05-14 LAB — BRAIN NATRIURETIC PEPTIDE: B Natriuretic Peptide: 535.2 pg/mL — ABNORMAL HIGH (ref 0.0–100.0)

## 2016-05-14 MED ORDER — SODIUM CHLORIDE 0.9 % IV BOLUS (SEPSIS)
1000.0000 mL | Freq: Once | INTRAVENOUS | Status: AC
  Administered 2016-05-14: 1000 mL via INTRAVENOUS

## 2016-05-14 MED ORDER — FUROSEMIDE 10 MG/ML IJ SOLN
80.0000 mg | Freq: Once | INTRAMUSCULAR | Status: AC
  Administered 2016-05-14: 80 mg via INTRAVENOUS
  Filled 2016-05-14: qty 8

## 2016-05-14 MED ORDER — SODIUM CHLORIDE 0.9% FLUSH
3.0000 mL | Freq: Two times a day (BID) | INTRAVENOUS | Status: DC
  Administered 2016-05-14 – 2016-05-16 (×4): 3 mL via INTRAVENOUS

## 2016-05-14 MED ORDER — APIXABAN 2.5 MG PO TABS
2.5000 mg | ORAL_TABLET | Freq: Two times a day (BID) | ORAL | Status: DC
  Administered 2016-05-14: 2.5 mg via ORAL
  Filled 2016-05-14 (×2): qty 1

## 2016-05-14 MED ORDER — DIGOXIN 125 MCG PO TABS
0.0625 mg | ORAL_TABLET | ORAL | Status: DC
  Administered 2016-05-15: 0.0625 mg via ORAL
  Filled 2016-05-14: qty 1

## 2016-05-14 MED ORDER — POLYETHYLENE GLYCOL 3350 17 G PO PACK
17.0000 g | PACK | Freq: Every day | ORAL | Status: DC | PRN
Start: 2016-05-14 — End: 2016-05-16

## 2016-05-14 MED ORDER — FINASTERIDE 5 MG PO TABS
5.0000 mg | ORAL_TABLET | Freq: Every day | ORAL | Status: DC
  Administered 2016-05-15: 5 mg via ORAL
  Filled 2016-05-14: qty 1

## 2016-05-14 MED ORDER — VANCOMYCIN HCL IN DEXTROSE 1-5 GM/200ML-% IV SOLN
1000.0000 mg | Freq: Once | INTRAVENOUS | Status: AC
  Administered 2016-05-14: 1000 mg via INTRAVENOUS
  Filled 2016-05-14: qty 200

## 2016-05-14 MED ORDER — AMIODARONE HCL 200 MG PO TABS
200.0000 mg | ORAL_TABLET | Freq: Two times a day (BID) | ORAL | Status: DC
  Administered 2016-05-14 – 2016-05-15 (×3): 200 mg via ORAL
  Filled 2016-05-14 (×3): qty 1

## 2016-05-14 MED ORDER — LEVOTHYROXINE SODIUM 75 MCG PO TABS
75.0000 ug | ORAL_TABLET | Freq: Every day | ORAL | Status: DC
  Administered 2016-05-15 – 2016-05-16 (×2): 75 ug via ORAL
  Filled 2016-05-14 (×2): qty 1

## 2016-05-14 MED ORDER — TAMSULOSIN HCL 0.4 MG PO CAPS
0.4000 mg | ORAL_CAPSULE | Freq: Two times a day (BID) | ORAL | Status: DC
  Administered 2016-05-14 – 2016-05-15 (×3): 0.4 mg via ORAL
  Filled 2016-05-14 (×3): qty 1

## 2016-05-14 MED ORDER — METOPROLOL TARTRATE 25 MG PO TABS
25.0000 mg | ORAL_TABLET | Freq: Two times a day (BID) | ORAL | Status: DC
  Administered 2016-05-14 – 2016-05-15 (×3): 25 mg via ORAL
  Filled 2016-05-14 (×3): qty 1

## 2016-05-14 MED ORDER — SODIUM CHLORIDE 0.9 % IV BOLUS (SEPSIS)
500.0000 mL | Freq: Once | INTRAVENOUS | Status: AC
  Administered 2016-05-14: 500 mL via INTRAVENOUS

## 2016-05-14 MED ORDER — PANCRELIPASE (LIP-PROT-AMYL) 12000-38000 UNITS PO CPEP
12000.0000 [IU] | ORAL_CAPSULE | Freq: Three times a day (TID) | ORAL | Status: DC
  Administered 2016-05-15 (×3): 12000 [IU] via ORAL
  Filled 2016-05-14 (×3): qty 1

## 2016-05-14 MED ORDER — AZELASTINE HCL 0.1 % NA SOLN
2.0000 | Freq: Two times a day (BID) | NASAL | Status: DC
  Administered 2016-05-14 – 2016-05-15 (×3): 2 via NASAL
  Filled 2016-05-14: qty 30

## 2016-05-14 MED ORDER — CIPROFLOXACIN HCL 0.3 % OP SOLN
2.0000 [drp] | Freq: Two times a day (BID) | OPHTHALMIC | Status: DC
  Administered 2016-05-14 – 2016-05-16 (×4): 2 [drp] via OPHTHALMIC
  Filled 2016-05-14: qty 2.5

## 2016-05-14 MED ORDER — VANCOMYCIN HCL 500 MG IV SOLR
500.0000 mg | Freq: Once | INTRAVENOUS | Status: AC
  Administered 2016-05-14: 500 mg via INTRAVENOUS
  Filled 2016-05-14: qty 500

## 2016-05-14 MED ORDER — DIGOXIN 125 MCG PO TABS: 0.0625 mg | ORAL_TABLET | ORAL | Status: DC

## 2016-05-14 MED ORDER — DEXTROSE 5 % IV SOLN
1.0000 g | INTRAVENOUS | Status: AC
  Administered 2016-05-14: 1 g via INTRAVENOUS
  Filled 2016-05-14: qty 1

## 2016-05-14 NOTE — ED Notes (Signed)
Report attempted 

## 2016-05-14 NOTE — Consult Note (Signed)
Primary Cardiologist: Dr. Caryl Comes Reason for Consultation: syncope   HPI:  Patient is an 82 yo M with h/o nonischemic cardiomyopathy, EF 25-30%, recurrent right pleural effusion, and chronic atrial fibrillation who presents after an episode of syncope.  Patient reports that he was standing at his sink around 10 or 11 am this morning and he all the sudden lost consciousness.  There were no prodromal symptoms and patient is unsure how long he was down for.  Since he regained consciousness he has felt weak, but otherwise ok. Denies chest pain or palpitations.  He saw his PCP 4 days ago, at that point his metolazone (which he took 5 mg qod) was discontinued given concern for worsening renal function and hypokalemia. He was started on spironolactone.  He remained on his torsemide. His urine output dropped off significantly and he gained 1.5 pounds since yesterday.  He remained short of breath.  He has at least 2 pillow orthopnea, at baseline.  He does not have a defibrillator.  He was diagnosed with his NICM in 12/16  In the ED: EKG revealed atrial fibrillation with slow ventricular response, which is unchanged from previous EKG last week. Head CT without acute intracranial abnormality, but atrophy and chronic microvascular disease. CXR with mild loculated right pleural effusion and increased bibasilar interstitial densities, concerning for possible edema. He appears to be chronically hypotensive and bradycardic.    L/RHC 11/09/15  Ost LM to LM lesion, 20% stenosed.  Mid Cx lesion, 30% stenosed.  Mid Cx to Dist Cx lesion, 30% stenosed.  Ost LAD lesion, 40% stenosed.  1st Diag lesion, 30% stenosed.  Mid LAD to Dist LAD lesion, 30% stenosed. RA = 1 RV = 22/0/1 PA = 23/6 (12) PCW = 5 LV = 94/1/4 Ao = 93/54 (69) Fick cardiac output/index = 4.5/2.2 PVR = 1.5 WU SVR = 1221 FA sat = 93% PA sat = 58%, 61%  Review of Systems:     Cardiac Review of Systems: {Y] = yes [ ]  = no  Chest  Pain [  n  ]  Resting SOB [ y  ] Exertional SOB  [  y]  Orthopnea Blue.Reese  ]   Pedal Edema [   ]    Palpitations [  ] Syncope  [  ]   Presyncope [   ]  General Review of Systems: [Y] = yes [  ]=no Constitional: recent weight change [ y]; anorexia [  ]; fatigue Blue.Reese  ]; nausea [  ]; night sweats [  ]; fever [  ]; or chills [  ];                                                                     Eyes : blurred vision [  ]; diplopia [   ]; vision changes [  ];  Amaurosis fugax[  ]; Resp: cough Blue.Reese  ];  wheezing[  ];  hemoptysis[  ];  PND [  ];  GI:  gallstones[  ], vomiting[  ];  dysphagia[  ]; melena[  ];  hematochezia [  ]; heartburn[  ];   GU: kidney stones [  ]; hematuria[  ];   dysuria [  ];  nocturia[  ]; incontinence [  ];  Skin: rash, swelling[  ];, hair loss[  ];  peripheral edema[  ];  or itching[  ]; Musculosketetal: myalgias[  ];  joint swelling[  ];  joint erythema[  ];  joint pain[  ];  back pain[  ];  Heme/Lymph: bruising[  ];  bleeding[  ];  anemia[  ];  Neuro: TIA[  ];  headaches[  ];  stroke[  ];  vertigo[  ];  seizures[  ];   paresthesias[  ];  difficulty walking[  ];  Psych:depression[  ]; anxiety[  ];  Endocrine: diabetes[y  ];  thyroid dysfunction[  ];  Other:  Past Medical History  Diagnosis Date  . Acid reflux disease     history of  . H/O: GI bleed   . Persistent atrial fibrillation ([place])     a. Failed tikosyn. b. DCCV did not hold 07/2015.  Marland Kitchen Benign prostatic hypertrophy     history of  . Hepatic damage march 2009    retained hepatic stone , intrahepatic duct catheter  . CAD (coronary artery disease)     a. R/LHC 11/09/15: mild nonobstructive CAD (NICM) with well- compensated hemodynamics with low filling pressures.  . Hypertension   . 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 Seizures ([place])   . Ventricular  tachycardia ? Tikosyn proarrhtyhmia   . Cataracts, bilateral   . Diabetes mellitus     insulin dep  . Pleural effusion 10/2015  . Acute on chronic systolic CHF (congestive heart failure) ([place])   . CKD (chronic kidney disease), stage II   . HCAP (healthcare-associated pneumonia) 12/10/2015  . Hypothyroid   . Pancreatic insufficiency ([place])   . Systolic CHF ([place])     No current facility-administered medications for this encounter.   Current Outpatient Prescriptions  Medication Sig Dispense Refill  . Amino Acids-Protein Hydrolys (FEEDING SUPPLEMENT, PRO-STAT SUGAR FREE 64,) LIQD Take 30 mLs by mouth 2 (two) times daily.    Marland Kitchen amiodarone (PACERONE) 200 MG tablet Take 1 tablet (200 mg total) by mouth 2 (two) times daily. 180 tablet 2  . apixaban (ELIQUIS) 2.5 MG TABS tablet Take 1 tablet (2.5 mg total) by mouth 2 (two) times daily. 180 tablet 2  . Ascorbic Acid (VITAMIN C) 500 MG tablet Take 500 mg by mouth daily.     . cholecalciferol (VITAMIN D) 1000 units tablet Take 1 tablet (1,000 Units total) by mouth daily. 100 tablet 3  . digoxin (LANOXIN) 0.125 MG tablet Take 0.5 tablets (0.0625 mg total) by mouth every other day. 30 tablet 0  . ergocalciferol (VITAMIN D2) 50000 units capsule Take 1 capsule (50,000 Units total) by mouth once a week. 6 capsule 0  . finasteride (PROSCAR) 5 MG tablet Take 1 tablet (5 mg total) by mouth daily. 90 tablet 3  . insulin aspart (NOVOLOG FLEXPEN) 100 UNIT/ML FlexPen Inject into the skin See admin instructions. Use 7 units at breakfast, use 7  units at lunch and use  7 units at supper    . insulin detemir (LEVEMIR) 100 UNIT/ML injection Inject 1 Units into the skin See admin instructions. Patient ONLY uses for sliding scale. 1-2 units is the max for the sliding scale.  If CBG is 150 or above    . levothyroxine (SYNTHROID, LEVOTHROID) 75 MCG tablet Take 1 tablet (75 mcg total) by mouth  daily. 90 tablet 1  . lipase/protease/amylase (CREON) 12000 units CPEP capsule  Take 1 capsule by mouth 3  times a day before meals 300 capsule 3  . metoprolol tartrate (LOPRESSOR) 25 MG tablet Take 1 tablet (25 mg total) by mouth 2 (two) times daily. 180 tablet 3  . Multiple Vitamin (MULTIVITAMIN WITH MINERALS) TABS tablet Take 1 tablet by mouth daily.    . potassium chloride (KLOR-CON) 8 MEQ tablet Take 1 tablet (8 mEq total) by mouth 3 (three) times daily. 90 tablet 2  . spironolactone (ALDACTONE) 25 MG tablet Take 1 tablet (25 mg total) by mouth daily. 90 tablet 1  . tamsulosin (FLOMAX) 0.4 MG CAPS capsule Take 1 capsule (0.4 mg total) by mouth 2 (two) times daily. 180 capsule 3  . torsemide (DEMADEX) 10 MG tablet Take 2 tablets (20 mg total) by mouth daily. 180 tablet 1  . azelastine (ASTELIN) 0.1 % nasal spray Place 2 sprays into both nostrils 2 (two) times daily. Use in each nostril as directed (Patient not taking: Reported on 04/28/2016) 90 mL 3  ,  Allergies  Allergen Reactions  . Lisinopril Cough  . Toujeo Solostar [Insulin Glargine] Other (See Comments)    Shaking, felt bad all over  . Clindamycin Diarrhea  . Other Other (See Comments)    toujeo with perioral tingling only - pt refuses to take further  . Oxycodone-Acetaminophen Diarrhea  . Penicillins Hives    Has patient had a PCN reaction causing immediate rash, facial/tongue/throat swelling, SOB or lightheadedness with hypotension: Yes Has patient had a PCN reaction causing severe rash involving mucus membranes or skin necrosis: no  Has patient had a PCN reaction that required hospitalization No Has patient had a PCN reaction occurring within the last 10 years: No If all of the above answers are "NO", then may proceed with Cephalosporin use.    Social History   Social History  . Marital Status: Married    Spouse Name: N/A  . Number of Children: 3  . Years of Education: N/A   Occupational History  . 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     Social History Main Topics  . Smoking status: Former  Smoker    Types: Pipe    Quit date: 03/14/1991  . Smokeless tobacco: Never Used  . Alcohol Use: No  . Drug Use: No  . Sexual Activity: No   Other Topics Concern  . Not on file   Social History Narrative   [employer] -IT sales professional. married 1954. 2 sons - [place]; 1 daughter 36. 7 grandchildren. retired- Training and development officer.  End of Life Care: DNR, no prolonged intubation (more than 2-3 days), no sustaining tube feeding, no futile or heroic measures. (Provided signed Out of Facility order and unsigned MOST form 6/112/12)    Family History  Problem Relation Age of Onset  . Coronary artery disease Father 33  . Heart disease Father     fatal MI  . Osteoarthritis Mother 32  . Arthritis Mother   . Coronary artery disease Brother     cabg, avr, pvd with sents  . Heart disease Brother     CABG, PVD  . Peripheral vascular disease Sister 39  . Fibromyalgia Sister   . Arthritis Sister   . Coronary artery disease Sister   . Diabetes Sister   . Cancer Brother     PHYSICAL EXAM: Filed Vitals:   05/13/2016 1645 04/30/2016 1648  BP: 98/51  98/51  Pulse: 59 59  Temp:    Resp:  20     Intake/Output Summary (Last 24 hours) at 05/05/2016 1716 Last data filed at 04/30/2016 1323  Gross per 24 hour  Intake   1000 ml  Output      0 ml  Net   1000 ml    General:  Cachectic elderly male in mild distress HEENT: bilateral purulent eye drainage Neck: supple. JVP to the earlobes Cor: bradycardic, distant heart sounds, +s3 appreciated Lungs: crackles and rhonchi throughout entire right lung, basilar crackles on the left Abdomen: soft, nontender, nondistended. No hepatosplenomegalyGood bowel sounds. Extremities: no cyanosis, clubbing, rash, edema Neuro: alert & oriented x 3,. moves all 4 extremities w/o difficulty. Affect pleasant.  ECG: atrial fibrillation with slow ventricular response  Results for orders placed or performed during the hospital encounter of 05/20/2016 (from the past  24 hour(s))  Digoxin level     Status: None   Collection Time: 05/09/2016 12:00 PM  Result Value Ref Range   Digoxin Level 1.2 0.8 - 2.0 ng/mL  Protime-INR     Status: Abnormal   Collection Time: 05/05/2016 12:00 PM  Result Value Ref Range   Prothrombin Time 19.3 (H) 11.6 - 15.2 seconds   INR 1.62 (H) 0.00 - 1.49  Magnesium     Status: Abnormal   Collection Time: 05/10/2016 12:00 PM  Result Value Ref Range   Magnesium 2.6 (H) 1.7 - 2.4 mg/dL  T4, free     Status: Abnormal   Collection Time: 05/22/2016 12:00 PM  Result Value Ref Range   Free T4 1.40 (H) 0.61 - 1.12 ng/dL  I-stat troponin, ED     Status: None   Collection Time: 05/10/2016 12:17 PM  Result Value Ref Range   Troponin i, poc 0.06 0.00 - 0.08 ng/mL   Comment 3          Basic metabolic panel     Status: Abnormal   Collection Time: 05/05/2016 12:19 PM  Result Value Ref Range   Sodium 125 (L) 135 - 145 mmol/L   Potassium 4.0 3.5 - 5.1 mmol/L   Chloride 83 (L) 101 - 111 mmol/L   CO2 31 22 - 32 mmol/L   Glucose, Bld 124 (H) 65 - 99 mg/dL   BUN 103 (H) 6 - 20 mg/dL   Creatinine, Ser 3.99 (H) 0.61 - 1.24 mg/dL   Calcium 8.1 (L) 8.9 - 10.3 mg/dL   GFR calc non Af Amer 12 (L) >60 mL/min   GFR calc Af Amer 14 (L) >60 mL/min   Anion gap 11 5 - 15  CBC     Status: Abnormal   Collection Time: 05/18/2016 12:19 PM  Result Value Ref Range   WBC 6.6 4.0 - 10.5 K/uL   RBC 3.51 (L) 4.22 - 5.81 MIL/uL   Hemoglobin 11.1 (L) 13.0 - 17.0 g/dL   HCT 33.4 (L) 39.0 - 52.0 %   MCV 95.2 78.0 - 100.0 fL   MCH 31.6 26.0 - 34.0 pg   MCHC 33.2 30.0 - 36.0 g/dL   RDW 16.6 (H) 11.5 - 15.5 %   Platelets 119 (L) 150 - 400 K/uL  Brain natriuretic peptide     Status: Abnormal   Collection Time: 05/13/2016 12:19 PM  Result Value Ref Range   B Natriuretic Peptide 535.2 (H) 0.0 - 100.0 pg/mL  TSH     Status: Abnormal   Collection Time: 05/24/2016 12:20 PM  Result Value Ref Range  TSH 7.627 (H) 0.350 - 4.500 uIU/mL  CBG monitoring, ED     Status: Abnormal     Collection Time: 05/13/2016 12:29 PM  Result Value Ref Range   Glucose-Capillary 116 (H) 65 - 99 mg/dL  Urinalysis, Routine w reflex microscopic     Status: Abnormal   Collection Time: 05/12/2016  1:17 PM  Result Value Ref Range   Color, Urine YELLOW YELLOW   APPearance CLOUDY (A) CLEAR   Specific Gravity, Urine 1.016 1.005 - 1.030   pH 5.5 5.0 - 8.0   Glucose, UA NEGATIVE NEGATIVE mg/dL   Hgb urine dipstick NEGATIVE NEGATIVE   Bilirubin Urine NEGATIVE NEGATIVE   Ketones, ur NEGATIVE NEGATIVE mg/dL   Protein, ur 30 (A) NEGATIVE mg/dL   Nitrite NEGATIVE NEGATIVE   Leukocytes, UA SMALL (A) NEGATIVE  Urine microscopic-add on     Status: Abnormal   Collection Time: 05/02/2016  1:17 PM  Result Value Ref Range   Squamous Epithelial / LPF 0-5 (A) NONE SEEN   WBC, UA 0-5 0 - 5 WBC/hpf   RBC / HPF 0-5 0 - 5 RBC/hpf   Bacteria, UA FEW (A) NONE SEEN   Casts HYALINE CASTS (A) NEGATIVE  I-Stat CG4 Lactic Acid, ED     Status: Abnormal   Collection Time: 05/27/2016  1:59 PM  Result Value Ref Range   Lactic Acid, Venous 2.71 (HH) 0.5 - 2.0 mmol/L   Comment NOTIFIED PHYSICIAN    Dg Chest Portable 1 View  05/21/2016  CLINICAL DATA:  Hypotension. EXAM: PORTABLE CHEST 1 VIEW COMPARISON:  Radiograph of Apr 01, 2016. FINDINGS: Stable cardiomegaly. No pneumothorax is noted. Mild loculated pleural effusion is seen over the right upper lobe. Increased bibasilar interstitial densities are noted concerning for possible edema. No significant pleural effusion is noted. Bony thorax is unremarkable. IMPRESSION: Mild loculated right pleural effusion is seen over the right upper lobe. Increased bibasilar interstitial densities are noted concerning for possible edema. Electronically Signed   By: Marijo Conception, M.D.   On: 05/06/2016 14:13     ASSESSMENT/PLAN: 1. Syncope, concerning for ventricular arrhythmia given sudden onset. History of NICM and VT, currently on amiodarone, but patient is still at risk in the  setting of volume overload.  Patient is also in atrial fibrillation with slow ventricular response, although this appears to be chronic. -- monitor on telemetry -- repeat limited tte to assess LV function, if not improved after 6 months of medical therapy, there should be a discussion with patient and family re: AICD -- discontinue digoxin, as this may be contributing to bradycardia and is unlikely providing much benefit, especially in the setting of acute kidney injury, dig level 1.2 today  -- continue beta-blocker, given concern for ventricular arrhythmia  2. Acute on chronic systolic heart failure. -- lasix 80 mg IV x1 and assess response, monitor renal function and electrolytes closely -- hold spironolactone, given acute kidney injury -- continue beta-blocker, despite bradycardia, he is close to his baseline and hemodynamically stable  3. Chronic atrial fibrillation. Currently on anticoagulation with Eliquis. Given slow ventricular rate and need for beta-blocker therapy, pacemaker would be indicated, this needs to be discussed with patient and family  4. AKI on CKD, likely cardiorenal in the setting of volume overload -- diuresis and monitor function and electrolytes, as above

## 2016-05-14 NOTE — ED Provider Notes (Signed)
CSN: [id number]     Arrival date & time 05/15/2016  1130 History   First MD Initiated Contact with Patient 05/19/2016 1147     Chief Complaint  Patient presents with  . Loss of Consciousness     (Consider location/radiation/quality/duration/timing/severity/associated sxs/prior Treatment) HPI   82yo male standing at sink, felt no presyncopal symptoms,  Syncope lasted seconds, but woke up prior to landing on the floor. Felt weak and fatigued following this episode, could not stand back up.  Saw PCP 3 days ago who discontinued metolazone and increased levothyroxine. Has felt more fatigued since that time. Had 2 episodes of loose stool yesterday.  Records urine output, normal 600cc urine overnight, this time 200cc over night.[place] Scrape like he has gained weight 1.5lb since yesterday.  No shortness of breath. No chest pain. No abdominal pain, no cough, no fevers, no black stool. Had not checked leg swelling this AM. Eye discharge began today, nose running yesterday.   Past Medical History  Diagnosis Date  . Acid reflux disease     history of  . H/O: GI bleed   . Persistent atrial fibrillation ([place])     a. Failed tikosyn. b. DCCV did not hold 07/2015.  Marland Kitchen Benign prostatic hypertrophy     history of  . Hepatic damage march 2009    retained hepatic stone , intrahepatic duct catheter  . CAD (coronary artery disease)     a. R/LHC 11/09/15: mild nonobstructive CAD (NICM) with well- compensated hemodynamics with low filling pressures.  . Hypertension   . 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 Seizures ([place])   . Ventricular tachycardia ? Tikosyn proarrhtyhmia   . Cataracts, bilateral   . Diabetes mellitus     insulin dep  . Pleural effusion 10/2015  . Acute on chronic systolic CHF (congestive heart failure) ([place])   . CKD (chronic kidney disease), stage II    . HCAP (healthcare-associated pneumonia) 12/10/2015  . Hypothyroid   . Pancreatic insufficiency ([place])   . Systolic CHF [facility])    Past Surgical History  Procedure Laterality Date  . Cataract extraction w/ intraocular lens  implant, bilateral Bilateral ~ 2014  . Cholecystectomy    . Partial gastrectomy    . Orif shoulder fracture Left 1941    2 pens inserted  . Whipple procedure      operation  . Tumor excision Right     sarcoma removal from tricep  . Inguinal hernia repair Right X 2  . Appendectomy    . Cardioversion  03/07/2012    Procedure: CARDIOVERSION;  Surgeon: Deboraha Sprang, MD;  Location: [place];  Service: Cardiovascular;  Laterality: N/A;  . Cardioversion N/A 08/10/2015    Procedure: CARDIOVERSION;  Surgeon: Thayer Headings, MD;  Location: [facility] ENDOSCOPY;  Service: Cardiovascular;  Laterality: N/A;  . Cardiac catheterization N/A 11/09/2015    Procedure: Right/Left Heart Cath and Coronary Angiography;  Surgeon: Jolaine Artist, MD;  Location: [place] CV LAB;  Service: Cardiovascular;  Laterality: N/A;  . Chest tube insertion Right 12/04/2015    Procedure: INSERTION RIGHT PLEURAL DRAINAGE CATHETER;  Surgeon: Gaye Pollack, MD;  Location: [place] OR;  Service: Thoracic;  Laterality: Right;  . Fracture surgery    . Thoracentesis    . Removal of pleural drainage catheter Right 01/01/2016  Procedure: REMOVAL OF PLEURAL DRAINAGE CATHETER;  Surgeon: Gaye Pollack, MD;  Location: [facility] OR;  Service: Thoracic;  Laterality: Right;   Family History  Problem Relation Age of Onset  . Coronary artery disease Father 25  . Heart disease Father     fatal MI  . Osteoarthritis Mother 36  . Arthritis Mother   . Coronary artery disease Brother     cabg, avr, pvd with sents  . Heart disease Brother     CABG, PVD  . Peripheral vascular disease Sister 7  . Fibromyalgia Sister   . Arthritis Sister   . Coronary artery disease Sister   . Diabetes Sister   . Cancer Brother    Social History   Substance Use Topics  . Smoking status: Former Smoker    Types: Pipe    Quit date: 03/14/1991  . Smokeless tobacco: Never Used  . Alcohol Use: No    Review of Systems  Constitutional: Positive for appetite change and fatigue. Negative for fever.  HENT: Positive for congestion and rhinorrhea (yesterday). Negative for sore throat.   Eyes: Positive for discharge (starting today) and redness. Negative for visual disturbance.  Respiratory: Negative for shortness of breath (denies (but does have increased O2 requirement)).   Cardiovascular: Negative for chest pain.  Gastrointestinal: Negative for nausea, vomiting, abdominal pain, diarrhea and constipation.  Genitourinary: Positive for difficulty urinating (has not been urinating as much over night).  Musculoskeletal: Negative for back pain and neck stiffness.  Skin: Negative for rash.  Neurological: Positive for syncope and weakness (generalized). Negative for speech difficulty, numbness and headaches.      Allergies  Lisinopril; Toujeo solostar; Clindamycin; Other; Oxycodone-acetaminophen; and Penicillins  Home Medications   Prior to Admission medications   Medication Sig Start Date End Date Taking? Authorizing Provider  Amino Acids-Protein Hydrolys (FEEDING SUPPLEMENT, PRO-STAT SUGAR FREE 64,) LIQD Take 30 mLs by mouth 2 (two) times daily.   Yes Historical Provider, MD  amiodarone (PACERONE) 200 MG tablet Take 1 tablet (200 mg total) by mouth 2 (two) times daily. 11/27/15  Yes Renee Dyane Dustman, PA-C  apixaban (ELIQUIS) 2.5 MG TABS tablet Take 1 tablet (2.5 mg total) by mouth 2 (two) times daily. 02/15/16  Yes Renee Dyane Dustman, PA-C  Ascorbic Acid (VITAMIN C) 500 MG tablet Take 500 mg by mouth daily.    Yes Historical Provider, MD  cholecalciferol (VITAMIN D) 1000 units tablet Take 1 tablet (1,000 Units total) by mouth daily. 04/08/16 04/08/17 Yes Evie Lacks Plotnikov, MD  digoxin (LANOXIN) 0.125 MG tablet Take 0.5 tablets (0.0625 mg  total) by mouth every other day. 04/05/16  Yes Bonnell Public, MD  ergocalciferol (VITAMIN D2) 50000 units capsule Take 1 capsule (50,000 Units total) by mouth once a week. 04/08/16  Yes Evie Lacks Plotnikov, MD  finasteride (PROSCAR) 5 MG tablet Take 1 tablet (5 mg total) by mouth daily. 06/10/15  Yes Janith Lima, MD  insulin aspart (NOVOLOG FLEXPEN) 100 UNIT/ML FlexPen Inject into the skin See admin instructions. Use 7 units at breakfast, use 7  units at lunch and use  7 units at supper   Yes Historical Provider, MD  insulin detemir (LEVEMIR) 100 UNIT/ML injection Inject 1 Units into the skin See admin instructions. Patient ONLY uses for sliding scale. 1-2 units is the max for the sliding scale.  If CBG is 150 or above   Yes Historical Provider, MD  levothyroxine (SYNTHROID, LEVOTHROID) 75 MCG tablet Take 1 tablet (75 mcg total) by  mouth daily. 05/11/16  Yes Janith Lima, MD  lipase/protease/amylase (CREON) 12000 units CPEP capsule Take 1 capsule by mouth 3  times a day before meals 03/07/16  Yes Janith Lima, MD  metoprolol tartrate (LOPRESSOR) 25 MG tablet Take 1 tablet (25 mg total) by mouth 2 (two) times daily. 12/01/15  Yes Renee Dyane Dustman, PA-C  Multiple Vitamin (MULTIVITAMIN WITH MINERALS) TABS tablet Take 1 tablet by mouth daily.   Yes Historical Provider, MD  potassium chloride (KLOR-CON) 8 MEQ tablet Take 1 tablet (8 mEq total) by mouth 3 (three) times daily. 05/10/16  Yes Janith Lima, MD  spironolactone (ALDACTONE) 25 MG tablet Take 1 tablet (25 mg total) by mouth daily. 05/10/16  Yes Janith Lima, MD  tamsulosin (FLOMAX) 0.4 MG CAPS capsule Take 1 capsule (0.4 mg total) by mouth 2 (two) times daily. 06/10/15  Yes Janith Lima, MD  torsemide (DEMADEX) 10 MG tablet Take 2 tablets (20 mg total) by mouth daily. 04/20/16  Yes Janith Lima, MD  azelastine (ASTELIN) 0.1 % nasal spray Place 2 sprays into both nostrils 2 (two) times daily. Use in each nostril as directed Patient not  taking: Reported on 05/03/2016 10/21/15   Janith Lima, MD   BP 97/46 mmHg  Pulse 61  Temp(Src) 97.2 F (36.2 C) (Oral)  Resp 18  Ht 6\' 3"  (1.905 m)  Wt 174 lb 3.2 oz (79.017 kg)  BMI 21.77 kg/m2  SpO2 91% Physical Exam  Constitutional: He is oriented to person, place, and time. He appears well-developed and well-nourished. No distress.  HENT:  Head: Normocephalic and atraumatic.  Mouth/Throat: Mucous membranes are dry.  Eyes: Conjunctivae and EOM are normal. Right eye exhibits exudate (thick yellow bilateral). Left eye exhibits exudate. Right eye conjunctiva injected: very mild. Left eye conjunctiva injected: very mild.  Mild erythema/irritation periorbitally  Neck: Normal range of motion. JVD present.  Cardiovascular: Normal rate, regular rhythm, normal heart sounds and intact distal pulses.  Exam reveals no gallop and no friction rub.   No murmur heard. Pulmonary/Chest: Effort normal and breath sounds normal. No respiratory distress. He has no wheezes. He has no rales.  Abdominal: Soft. He exhibits no distension. There is no tenderness. There is no guarding.  Musculoskeletal: He exhibits edema (2-3+ bilaterally).  Neurological: He is alert and oriented to person, place, and time. He has normal strength. No cranial nerve deficit or sensory deficit. Coordination normal. GCS eye subscore is 4. GCS verbal subscore is 5. GCS motor subscore is 6.  Skin: Skin is warm and dry. He is not diaphoretic.  Nursing note and vitals reviewed.   ED Course  Procedures (including critical care time) Labs Review Labs Reviewed  BASIC METABOLIC PANEL - Abnormal; Notable for the following:    Sodium 125 (*)    Chloride 83 (*)    Glucose, Bld 124 (*)    BUN 103 (*)    Creatinine, Ser 3.99 (*)    Calcium 8.1 (*)    GFR calc non Af Amer 12 (*)    GFR calc Af Amer 14 (*)    All other components within normal limits  CBC - Abnormal; Notable for the following:    RBC 3.51 (*)    Hemoglobin 11.1  (*)    HCT 33.4 (*)    RDW 16.6 (*)    Platelets 119 (*)    All other components within normal limits  URINALYSIS, ROUTINE W REFLEX MICROSCOPIC (NOT AT [facility]) - Abnormal;  Notable for the following:    APPearance CLOUDY (*)    Protein, ur 30 (*)    Leukocytes, UA SMALL (*)    All other components within normal limits  PROTIME-INR - Abnormal; Notable for the following:    Prothrombin Time 19.3 (*)    INR 1.62 (*)    All other components within normal limits  MAGNESIUM - Abnormal; Notable for the following:    Magnesium 2.6 (*)    All other components within normal limits  TSH - Abnormal; Notable for the following:    TSH 7.627 (*)    All other components within normal limits  T4, FREE - Abnormal; Notable for the following:    Free T4 1.40 (*)    All other components within normal limits  BRAIN NATRIURETIC PEPTIDE - Abnormal; Notable for the following:    B Natriuretic Peptide 535.2 (*)    All other components within normal limits  URINE MICROSCOPIC-ADD ON - Abnormal; Notable for the following:    Squamous Epithelial / LPF 0-5 (*)    Bacteria, UA FEW (*)    Casts HYALINE CASTS (*)    All other components within normal limits  GLUCOSE, CAPILLARY - Abnormal; Notable for the following:    Glucose-Capillary 139 (*)    All other components within normal limits  CBG MONITORING, ED - Abnormal; Notable for the following:    Glucose-Capillary 116 (*)    All other components within normal limits  I-STAT CG4 LACTIC ACID, ED - Abnormal; Notable for the following:    Lactic Acid, Venous 2.71 (*)    All other components within normal limits  CULTURE, BLOOD (ROUTINE X 2)  CULTURE, BLOOD (ROUTINE X 2)  DIGOXIN LEVEL  I-STAT TROPOININ, ED  I-STAT CG4 LACTIC ACID, ED    Imaging Review Ct Head Wo Contrast  05/19/2016  CLINICAL DATA:  Witnessed syncopal episode and fall this morning. EXAM: CT HEAD WITHOUT CONTRAST TECHNIQUE: Contiguous axial images were obtained from the base of the skull  through the vertex without intravenous contrast. COMPARISON:  Head CT 07/06/2013 FINDINGS: Brain: No evidence of acute infarction, hemorrhage, extra-axial collection, ventriculomegaly, or mass effect. There is moderate brain parenchymal atrophy and mild deep white matter microvascular ischemia. Vascular: No hyperdense vessel or unexpected calcification. Skull: Negative for fracture or focal lesion. Sinuses/Orbits: No acute findings. Other: None. IMPRESSION: No acute intracranial abnormality. Atrophy, chronic microvascular disease. Electronically Signed   By: Fidela Salisbury M.D.   On: 05/19/2016 17:39   Dg Chest Portable 1 View  05/04/2016  CLINICAL DATA:  Hypotension. EXAM: PORTABLE CHEST 1 VIEW COMPARISON:  Radiograph of Apr 01, 2016. FINDINGS: Stable cardiomegaly. No pneumothorax is noted. Mild loculated pleural effusion is seen over the right upper lobe. Increased bibasilar interstitial densities are noted concerning for possible edema. No significant pleural effusion is noted. Bony thorax is unremarkable. IMPRESSION: Mild loculated right pleural effusion is seen over the right upper lobe. Increased bibasilar interstitial densities are noted concerning for possible edema. Electronically Signed   By: Marijo Conception, M.D.   On: 05/15/2016 14:13   I have personally reviewed and evaluated these images and lab results as part of my medical decision-making.   EKG Interpretation None        EMERGENCY DEPARTMENT [place] CARDIAC EXAM "Study: Limited Ultrasound of the heart and pericardium"  INDICATIONS:Hypotension Multiple views of the heart and pericardium were obtained in real-time with a multi-frequency probe.  PERFORMED [id number]  IMAGES ARCHIVED?: Yes  FINDINGS: No  pericardial effusion and Decreased contractility  LIMITATIONS:  Body habitus   VIEWS USED: Subcostal 4 chamber, Parasternal long axis and Parasternal short axis  INTERPRETATION: Cardiac activity present, Pericardial effusioin  absent and Decreased contractility, Pleural effusion present, difficult to obtain views of IVC  CPT Code: [id number] (limited transthoracic cardiac)   CRITICAL CARE: hypotension, hyponatremia, acute kidney injury Performed by: Alvino Chapel   Total critical care time: 30 minutes  Critical care time was exclusive of separately billable procedures and treating other patients.  Critical care was necessary to treat or prevent imminent or life-threatening deterioration.  Critical care was time spent personally by me on the following activities: development of treatment plan with patient and/or surrogate as well as nursing, discussions with consultants, evaluation of patient's response to treatment, examination of patient, obtaining history from patient or surrogate, ordering and performing treatments and interventions, ordering and review of laboratory studies, ordering and review of radiographic studies, pulse oximetry and re-evaluation of patient's condition.    MDM   Final diagnoses:  AKI (acute kidney injury) ([place])  Syncope, unspecified syncope type  Hyponatremia  Hypotension, unspecified hypotension type  Lactic acidosis    82yo male with history of atrial fibrillation on eliquis, paroxysmal VT, chronic systolic CHF, past htn, DM, admission to hospital in May for acute kidney injury, recent visit to PCP when metolazone was changed to spironolactone and levothyroxine was increased presents with concern for 3 days of fatigue and syncope today without prodrome followed by more severe fatigue.  EKG evaluated by me and shows atrial fibrillation versus junctional rhythm, low voltage, rate in 50s similar to prior ECGs.  Patient with some chronic hypotension (BP 0000000 systolic-110s pet pt and records) however with mild worsening today with range from [id number] systolic to 123XX123. Lactic acid elevation also indicates some hypoperfusion, suspect from either CHF or dehydration. Patient with dry  mucous membranes, hypotension, and given 500cc normal saline with mild increase of BP to primarily [id number] systolic BP.  Limited bedside US shows no sign of tamponade, and low contractility.  It is difficult to assess volume status on exam, pt with bilateral edema, JVD, some increased O2 requirement however dry mucus membranes, increased BUN and Cr. BNP in 500s, less than prior however increased from January.  Doubt sepsis as etiology of lactic acidosis and hypotension given no bacterial infection source, no leukocytosis, no fever, however will do blood cx and cover with empiric abx given hypotension. Suspect conjunctivitis bilaterally. Doubt other acute bacterial infxn/no sign of orbital cellulitis.  Consulted Cardiology given syncope, hx of VT, and volume status difficult to discern.  Pt with hyponatremia, worsening renal failure on labs. Normal digoxin.  CT head negative. No history to suggest other injuries. Patient to be admitted to stepdown for further care by medicine team.    Gareth Morgan, MD 05/18/2016 2106

## 2016-05-14 NOTE — ED Notes (Signed)
1L of normal saline started at 1200

## 2016-05-14 NOTE — ED Notes (Signed)
Pt given urinal for him to provide urine sample.

## 2016-05-14 NOTE — Progress Notes (Signed)
ANTIBIOTIC CONSULT NOTE - INITIAL  Pharmacy Consult for Vancomycin/Cefepime Indication: sepsis  Allergies  Allergen Reactions  . Lisinopril Cough  . Toujeo Solostar [Insulin Glargine] Other (See Comments)    Shaking, felt bad all over  . Clindamycin Diarrhea  . Other Other (See Comments)    toujeo with perioral tingling only - pt refuses to take further  . Oxycodone-Acetaminophen Diarrhea  . Penicillins Hives    Has patient had a PCN reaction causing immediate rash, facial/tongue/throat swelling, SOB or lightheadedness with hypotension: Yes Has patient had a PCN reaction causing severe rash involving mucus membranes or skin necrosis: no  Has patient had a PCN reaction that required hospitalization No Has patient had a PCN reaction occurring within the last 10 years: No If all of the above answers are "NO", then may proceed with Cephalosporin use.    Patient Measurements: Weight:  76.8kg ~ 169 pounds (per patient) Height:  75 inches ~ 6'3" (per patient)   Vital Signs: Temp: 97.6 F (36.4 C) (06/17 1134) Temp Source: Oral (06/17 1134) BP: 90/52 mmHg (06/17 1330) Pulse Rate: 54 (06/17 1330)   Intake/Output from this shift: Total I/O In: 1000 [I.V.:1000] Out: -   Labs:  Recent Labs  05/12/2016 1219  WBC 6.6  HGB 11.1*  PLT 119*  CREATININE 3.99*   Estimated Creatinine Clearance: 13.7 mL/min (by C-G formula based on Cr of 3.99).  Microbiology: No results found for this or any previous visit (from the past 720 hour(s)).  Medical History: Past Medical History  Diagnosis Date  . Acid reflux disease     history of  . H/O: GI bleed   . Persistent atrial fibrillation ([place])     a. Failed tikosyn. b. DCCV did not hold 07/2015.  Marland Kitchen Benign prostatic hypertrophy     history of  . Hepatic damage march 2009    retained hepatic stone , intrahepatic duct catheter  . CAD (coronary artery disease)     a. R/LHC 11/09/15: mild nonobstructive CAD (NICM) with well- compensated  hemodynamics with low filling pressures.  . Hypertension   . 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 Seizures ([place])   . Ventricular tachycardia ? Tikosyn proarrhtyhmia   . Cataracts, bilateral   . Diabetes mellitus     insulin dep  . Pleural effusion 10/2015  . Acute on chronic systolic CHF (congestive heart failure) ([place])   . CKD (chronic kidney disease), stage II   . HCAP (healthcare-associated pneumonia) 12/10/2015  . Hypothyroid   . Pancreatic insufficiency ([place])   . Systolic CHF ([place])    Medications:  Anti-infectives    Start     Dose/Rate Route Frequency Ordered Stop   05/19/2016 1400  vancomycin (VANCOCIN) IVPB 1000 mg/200 mL premix     1,000 mg 200 mL/hr over 60 Minutes Intravenous  Once 05/08/2016 1339     05/15/2016 1345  ceFEPIme (MAXIPIME) 1 g in dextrose 5 % 50 mL IVPB     1 g 100 mL/hr over 30 Minutes Intravenous STAT 05/09/2016 1339 05/15/16 1345   05/21/2016 1345  vancomycin (VANCOCIN) 500 mg in sodium chloride 0.9 % 100 mL IVPB     500 mg 100 mL/hr over 60 Minutes Intravenous  Once 05/24/2016 1343       Assessment: 82yo admitted with syncopal episode - felt weak  with fatigue afterward.  He is being admitted for possible sepsis and we have been asked to start him IV Vancomycin and Cefepime.  I spoke with Mr. Maloy and he confirms a penicillin allergy but states he only got hives and that it was many years ago.  His creatinine is 3.99 and he has an estimated crcl of 60ml/min. His BUN is elevated which may indicate some dehydration vs. renal issues. He has a lactic acid of 2.71but his WBC is WNL.  Goal of Therapy:  Vancomycin trough level 15-20 mcg/ml  Plan:  Vancomycin 1.5gm x 1 then begin maintenance of 1gm every 48 hours. Cefepime 1gm x 1 then Cefepime 1gm every 24hr. F/U renal fxn. and adjust dose accordingly  Rober Minion,  PharmD., MS Clinical Pharmacist Pager:  [phone] Thank you for allowing pharmacy to be part of this patients care team. 05/13/2016,2:04 PM

## 2016-05-14 NOTE — ED Notes (Signed)
PER EMS: pt here for witnessed syncopal episode and fall this morning. LOC only for a few seconds. HR-50. BP-86/46, states he did take his beta blocker this morning. CBG-136. EMS adm 100cc normal saline. Denies pain from fall. A&Ox4.

## 2016-05-14 NOTE — H&P (Signed)
History and Physical    [name] [id number] DOB: 07/26/1925 DOA: 05/13/2016  PCP: [name], MD  Patient coming from:   home    Chief Complaint: Passed out at home  HPI: [name] is a 82 y.o. male with medical history significant for but not necessarily limited to, chronic systolic heart failure, diabetes, ventricular arrhythmias, and thyroid disease. This morning, while standing at the sink around 10 AM patient states he passed out. Over the last few days patient has become increasingly weak and correlates this time wise with change in diuretics. Metolazone was discontinued, Aldactone started a few days ago. Patient gained 1.5 pounds yesterday. He notes decreased urinary output last night from his typical 200 cc to about 30 cc. No change in baseline shortness of breath.   ED Course:  Mildly hypertensive, improved with fluid bolus Afebrile..  500 cc bolus, empirical antibiotics Urine culture, blood cultures pending Lactic acid initially 2.71, down to 1.7 with fluids Normal white count , hemoglobin at baseline -11.1  Chest x-ray concerning for edema as well as a mild loculated right pleural effusion of the right upper lobe   Review of Systems: As per HPI, otherwise 10 point review of systems negative.   Past Medical History  Diagnosis Date  . Acid reflux disease     history of  . H/O: GI bleed   . Persistent atrial fibrillation ([place])     a. Failed tikosyn. b. DCCV did not hold 07/2015.  Marland Kitchen Benign prostatic hypertrophy     history of  . Hepatic damage march 2009    retained hepatic stone , intrahepatic duct catheter  . CAD (coronary artery disease)     a. R/LHC 11/09/15: mild nonobstructive CAD ([facility]) with well- compensated hemodynamics with low filling pressures.  . Hypertension   . 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 Seizures ([place])   . Ventricular tachycardia ? Tikosyn proarrhtyhmia   . Cataracts, bilateral   . Diabetes mellitus     insulin dep  . Pleural effusion 10/2015  . Acute on chronic systolic CHF (congestive heart failure) ([place])   . CKD (chronic kidney disease), stage II   . HCAP (healthcare-associated pneumonia) 12/10/2015  . Hypothyroid   . Pancreatic insufficiency ([place])   . Systolic CHF [facility])     Past Surgical History  Procedure Laterality Date  . Cataract extraction w/ intraocular lens  implant, bilateral Bilateral ~ 2014  . Cholecystectomy    . Partial gastrectomy    . Orif shoulder fracture Left 1941    2 pens inserted  . Whipple procedure      operation  . Tumor excision Right     sarcoma removal from tricep  . Inguinal hernia repair Right X 2  . Appendectomy    . Cardioversion  03/07/2012    Procedure: CARDIOVERSION;  Surgeon: Deboraha Sprang, MD;  Location: [place];  Service: Cardiovascular;  Laterality: N/A;  . Cardioversion N/A 08/10/2015    Procedure: CARDIOVERSION;  Surgeon: Thayer Headings, MD;  Location: [facility] ENDOSCOPY;  Service: Cardiovascular;  Laterality: N/A;  . Cardiac catheterization N/A 11/09/2015    Procedure: Right/Left Heart Cath and Coronary Angiography;  Surgeon: Jolaine Artist, MD;  Location: [place] CV LAB;  Service: Cardiovascular;  Laterality: N/A;  . Chest tube insertion  Right 12/04/2015    Procedure: INSERTION RIGHT PLEURAL DRAINAGE CATHETER;  Surgeon: Gaye Pollack, MD;  Location: [place] OR;  Service: Thoracic;  Laterality: Right;  . Fracture surgery    . Thoracentesis    . Removal of pleural drainage catheter Right 01/01/2016    Procedure: REMOVAL OF PLEURAL DRAINAGE CATHETER;  Surgeon: Gaye Pollack, MD;  Location: [facility] OR;  Service: Thoracic;  Laterality: Right;    Social History   Social History  . Marital Status: Married    Spouse Name: N/A  . Number of Children: 3  . Years of Education: N/A   Occupational History  . 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     Social History Main Topics  . Smoking status: Former Smoker    Types: Pipe    Quit date: 03/14/1991  . Smokeless tobacco: Never Used  . Alcohol Use: No  . Drug Use: No  . Sexual Activity: No   Other Topics Concern  . Not on file   Social History Narrative   [employer] -IT sales professional. married 1954. 2 sons - [place]; 1 daughter 45. 7 grandchildren. retired- Training and development officer.  End of Life Care: DNR, no prolonged intubation (more than 2-3 days), no sustaining tube feeding, no futile or heroic measures. (Provided signed Out of Facility order and unsigned MOST form 6/112/12)   Lives at home with wife. Uses walker for ambulation  Allergies  Allergen Reactions  . Lisinopril Cough  . Toujeo Solostar [Insulin Glargine] Other (See Comments)    Shaking, felt bad all over  . Clindamycin Diarrhea  . Other Other (See Comments)    toujeo with perioral tingling only - pt refuses to take further  . Oxycodone-Acetaminophen Diarrhea  . Penicillins Hives    Has patient had a PCN reaction causing immediate rash, facial/tongue/throat swelling, SOB or lightheadedness with hypotension: Yes Has patient had a PCN reaction causing severe rash involving mucus membranes or skin necrosis: no  Has patient had a PCN reaction that required hospitalization No Has patient had a PCN reaction occurring within the last 10 years: No If all of the above answers are "NO", then may proceed with Cephalosporin use.    Family History  Problem Relation Age of Onset  . Coronary artery disease Father 68  . Heart disease Father     fatal MI  . Osteoarthritis Mother 60  . Arthritis Mother   . Coronary artery disease Brother     cabg, avr, pvd with sents  . Heart disease Brother     CABG, PVD  . Peripheral vascular disease Sister 68  . Fibromyalgia Sister   . Arthritis Sister   . Coronary artery disease Sister   . Diabetes Sister   . Cancer Brother     Prior to Admission  medications   Medication Sig Start Date End Date Taking? Authorizing Provider  Amino Acids-Protein Hydrolys (FEEDING SUPPLEMENT, PRO-STAT SUGAR FREE 64,) LIQD Take 30 mLs by mouth 2 (two) times daily.   Yes Historical Provider, MD  amiodarone (PACERONE) 200 MG tablet Take 1 tablet (200 mg total) by mouth 2 (two) times daily. 11/27/15  Yes Renee Dyane Dustman, PA-C  apixaban (ELIQUIS) 2.5 MG TABS tablet Take 1 tablet (2.5 mg total) by mouth 2 (two) times daily. 02/15/16  Yes Renee Dyane Dustman, PA-C  Ascorbic Acid (VITAMIN C) 500 MG tablet Take 500 mg by mouth daily.    Yes Historical Provider, MD  cholecalciferol (VITAMIN  D) 1000 units tablet Take 1 tablet (1,000 Units total) by mouth daily. 04/08/16 04/08/17 Yes Evie Lacks Plotnikov, MD  digoxin (LANOXIN) 0.125 MG tablet Take 0.5 tablets (0.0625 mg total) by mouth every other day. 04/05/16  Yes Bonnell Public, MD  ergocalciferol (VITAMIN D2) 50000 units capsule Take 1 capsule (50,000 Units total) by mouth once a week. 04/08/16  Yes Evie Lacks Plotnikov, MD  finasteride (PROSCAR) 5 MG tablet Take 1 tablet (5 mg total) by mouth daily. 06/10/15  Yes Janith Lima, MD  insulin aspart (NOVOLOG FLEXPEN) 100 UNIT/ML FlexPen Inject into the skin See admin instructions. Use 7 units at breakfast, use 7  units at lunch and use  7 units at supper   Yes Historical Provider, MD  insulin detemir (LEVEMIR) 100 UNIT/ML injection Inject 1 Units into the skin See admin instructions. Patient ONLY uses for sliding scale. 1-2 units is the max for the sliding scale.  If CBG is 150 or above   Yes Historical Provider, MD  levothyroxine (SYNTHROID, LEVOTHROID) 75 MCG tablet Take 1 tablet (75 mcg total) by mouth daily. 05/11/16  Yes Janith Lima, MD  lipase/protease/amylase (CREON) 12000 units CPEP capsule Take 1 capsule by mouth 3  times a day before meals 03/07/16  Yes Janith Lima, MD  metoprolol tartrate (LOPRESSOR) 25 MG tablet Take 1 tablet (25 mg total) by mouth 2 (two) times  daily. 12/01/15  Yes Renee Dyane Dustman, PA-C  Multiple Vitamin (MULTIVITAMIN WITH MINERALS) TABS tablet Take 1 tablet by mouth daily.   Yes Historical Provider, MD  potassium chloride (KLOR-CON) 8 MEQ tablet Take 1 tablet (8 mEq total) by mouth 3 (three) times daily. 05/10/16  Yes Janith Lima, MD  spironolactone (ALDACTONE) 25 MG tablet Take 1 tablet (25 mg total) by mouth daily. 05/10/16  Yes Janith Lima, MD  tamsulosin (FLOMAX) 0.4 MG CAPS capsule Take 1 capsule (0.4 mg total) by mouth 2 (two) times daily. 06/10/15  Yes Janith Lima, MD  torsemide (DEMADEX) 10 MG tablet Take 2 tablets (20 mg total) by mouth daily. 04/20/16  Yes Janith Lima, MD  azelastine (ASTELIN) 0.1 % nasal spray Place 2 sprays into both nostrils 2 (two) times daily. Use in each nostril as directed Patient not taking: Reported on 05/07/2016 10/21/15   Janith Lima, MD    Physical Exam: Filed Vitals:   05/12/2016 1415 05/25/2016 1430 05/22/2016 1500 05/09/2016 1515  BP: 97/47 99/49 97/48  92/54  Pulse: 56 57 57 57  Temp:      TempSrc:      Resp: 19 18    SpO2: 94% 93% 94% 93%    Constitutional:  Pleasant, thin white male in NAD, calm, comfortable Filed Vitals:   05/18/2016 1415 05/16/2016 1430 05/13/2016 1500 04/30/2016 1515  BP: 97/47 99/49 97/48  92/54  Pulse: 56 57 57 57  Temp:      TempSrc:      Resp: 19 18    SpO2: 94% 93% 94% 93%   Eyes: PER,  Conjunctiva with yellowish discharge bilaterally ENMT: Mucous membranes are moist. Posterior pharynx clear of any exudate or lesions.Normal dentition.  Neck: normal, supple, no masses Respiratory: RLL inspiratory crackles. Decreased breath sounds bilaterally. Normal respiratory effort. No accessory muscle use.  Cardiovascular: Regular rate and rhythm, no murmurs / rubs / gallops. No extremity edema. 2+ pedal pulses. No carotid bruits.  Abdomen: no tenderness, no masses palpated. No hepatomegaly. Bowel sounds positive.  Musculoskeletal: no clubbing / cyanosis.  No joint  deformity upper and lower extremities. Good ROM, no contractures. Normal muscle tone.  Skin: no rashes, lesions, ulcers.  Neurologic: CN 2-12 grossly intact. Sensation intact, Strength 5/5 in all 4.  Psychiatric: Normal judgment and insight. Alert and oriented x 3. Normal mood.   Labs on Admission: I have personally reviewed following labs and imaging studies  Urine analysis:    Component Value Date/Time   COLORURINE YELLOW 05/08/2016 1317   APPEARANCEUR CLOUDY* 05/21/2016 1317   LABSPEC 1.016 05/04/2016 1317   PHURINE 5.5 05/15/2016 1317   GLUCOSEU NEGATIVE 05/26/2016 1317   GLUCOSEU NEGATIVE 02/25/2015 1524   [place] 05/11/2016 1317   [place] 05/06/2016 1317   KETONESUR NEGATIVE 05/27/2016 1317   PROTEINUR 30* 05/13/2016 1317   UROBILINOGEN 0.2 02/25/2015 1524   NITRITE NEGATIVE 05/21/2016 1317   LEUKOCYTESUR SMALL* 05/25/2016 1317    Radiological Exams on Admission: Dg Chest Portable 1 View  05/02/2016  CLINICAL DATA:  Hypotension. EXAM: PORTABLE CHEST 1 VIEW COMPARISON:  Radiograph of Apr 01, 2016. FINDINGS: Stable cardiomegaly. No pneumothorax is noted. Mild loculated pleural effusion is seen over the right upper lobe. Increased bibasilar interstitial densities are noted concerning for possible edema. No significant pleural effusion is noted. Bony thorax is unremarkable. IMPRESSION: Mild loculated right pleural effusion is seen over the right upper lobe. Increased bibasilar interstitial densities are noted concerning for possible edema. Electronically Signed   By: Marijo Conception, M.D.   On: 05/04/2016 14:13    Assessment/Plan   Active Problems:   Type II diabetes mellitus with manifestations (HCC)   Paroxysmal VT-proarrhythmia 2/2 tikosyn   Hypothyroid   Chronic systolic CHF (congestive heart failure) (HCC)   Electrolyte disturbance   AKI (acute kidney injury) (HCC)   Syncope   CKD (chronic kidney disease) stage 3, GFR 30-59 ml/min      Syncopal  episode. Orthostatic hypotension vrs cardiac dysrhythmia.  Suspect intravascular depletion. There was some concern for possible sepsis in the emergency department ,patient covered empirically with antibiotics but afebrile, normal WBC and lactic acid has normalized with conservative fluid repletion -Place in observation- telemetry      -syncope order set utilized -follow up on echo, blood and urine cx -hold off any any further antibiotics for now -Appreciate Cardiology input.   Electrolyte disturbances. Asian has had recent change in his diuretics. Toles on replaced with spironolactone because of a potassium level of 2.8. Potassium is up to 4.0 today but sodium down from 129 to 125 (probably secondary to dehydration).  -Will hold home K+ given given that K+ has normalized and patient now with AKI. May need to resume if diuresed. today.   AKI superimposed on CKD 3-4. Cr 3.99, up from 2.38 mid May.  -Holding home diuretics pending Cardiology's evaluation -he received 500cc IVF in ED -am BMET -Avoid nephrotoxic medications  Paroxysmal VT-proarrhythmia. Patient is maintained on amiodarone, digoxin. Normal digoxin level -monitor on tele   Chronic atrial fib, on Eliquis, Amio, Digoxin. Chadsvasc 5-6. Rate controlled, actually bradycardic.  -monitor on tele -continue anti-arrhy medications, Eliquis  Acute on chronic systolic heart failure. Echo December with EF of 25-30%,  He has chronic peripheral edema but cxr today reveals right loculated pleural effusion ( mild ) and possible pulmonary edema. BNP 532. He reports mild weight gain, decreased urine output since diuretic change a few days ago.  Loop diuretic replaced with Aldactone  because of severe hypokalemia. -daily intake + output, daily wts -continue beta blocker -holding home  diuretics pending Cards evaluation. He has AKI / CKD ( ? decompensated heart failure). Diuresis per cardiology    Hypothyroidism, Synthroid recently increased because  of symptoms and elevated TSH but his free T4 was normal.  -continue home synthroid  DM 2.  Blood glucose controlled, A1c a few days ago was 6.1 -hold home basal insulin for now -CBGs, cannot take Novolog (allergy) so just monitor glucose for now.  -carb modified diet                                   DVT prophylaxis:   On Eliquis Code Status:   Limited code. CPR okay but DNI Family Communication:     None.  Disposition Plan:  Discharge home in 24-48 hours              Consults called:  Cardiology- seeing her now Admission status:  Observation -  telemetry   Tye Savoy NP [facility] Pager [phone]  If 7PM-7AM, please contact night-coverage [email or web address] Password [facility]  05/18/2016, 3:49 PM

## 2016-05-15 ENCOUNTER — Other Ambulatory Visit (HOSPITAL_COMMUNITY): Payer: Medicare Other

## 2016-05-15 ENCOUNTER — Inpatient Hospital Stay (HOSPITAL_COMMUNITY): Payer: Medicare Other

## 2016-05-15 DIAGNOSIS — R55 Syncope and collapse: Secondary | ICD-10-CM

## 2016-05-15 DIAGNOSIS — Z515 Encounter for palliative care: Secondary | ICD-10-CM | POA: Insufficient documentation

## 2016-05-15 DIAGNOSIS — N189 Chronic kidney disease, unspecified: Secondary | ICD-10-CM

## 2016-05-15 DIAGNOSIS — E878 Other disorders of electrolyte and fluid balance, not elsewhere classified: Secondary | ICD-10-CM

## 2016-05-15 DIAGNOSIS — I5022 Chronic systolic (congestive) heart failure: Secondary | ICD-10-CM

## 2016-05-15 DIAGNOSIS — N179 Acute kidney failure, unspecified: Secondary | ICD-10-CM

## 2016-05-15 DIAGNOSIS — I5023 Acute on chronic systolic (congestive) heart failure: Secondary | ICD-10-CM

## 2016-05-15 LAB — ECHOCARDIOGRAM COMPLETE
AO mean calculated velocity dopler: 138 cm/s
AV Mean grad: 9 mmHg
AV VEL mean LVOT/AV: 0.51
AV area mean vel ind: 1.33 cm2/m2
AV peak Index: 1.45
AV pk vel: 199 cm/s
AVAREAVTIIND: 1.27 cm2/m2
AVPG: 16 mmHg
Ao pk vel: 0.56 m/s
CHL CUP AV VALUE AREA INDEX: 1.27
CHL CUP DOP CALC LVOT VTI: 23.3 cm
CHL CUP MV DEC (S): 190
CHL CUP RV SYS PRESS: 37 mmHg
CHL CUP TV REG PEAK VELOCITY: 267 cm/s
EERAT: 10.89
EWDT: 190 ms
FS: 16 % — AB (ref 28–44)
HEIGHTINCHES: 75 in
IVS/LV PW RATIO, ED: 1.37
LA ID, A-P, ES: 53 mm
LADIAMINDEX: 2.6 cm/m2
LAVOLA4C: 41.1 mL
LEFT ATRIUM END SYS DIAM: 53 mm
LV E/e' medial: 10.89
LV E/e'average: 10.89
LV PW d: 8.78 mm — AB (ref 0.6–1.1)
LV TDI E'LATERAL: 10.1
LVELAT: 10.1 cm/s
LVOT area: 5.31 cm2
LVOT diameter: 26 mm
LVOT peak vel: 111 cm/s
LVOTSV: 124 mL
LVOTVTI: 0.49 cm
MV Peak grad: 5 mmHg
MV pk E vel: 110 m/s
MVPKAVEL: 44.6 m/s
TDI e' medial: 5.11
TR max vel: 267 cm/s
VTI: 48 cm
WEIGHTICAEL: 2787.2 [oz_av]

## 2016-05-15 LAB — BASIC METABOLIC PANEL
Anion gap: 13 (ref 5–15)
BUN: 97 mg/dL — ABNORMAL HIGH (ref 6–20)
CALCIUM: 8.1 mg/dL — AB (ref 8.9–10.3)
CHLORIDE: 85 mmol/L — AB (ref 101–111)
CO2: 30 mmol/L (ref 22–32)
Creatinine, Ser: 3.7 mg/dL — ABNORMAL HIGH (ref 0.61–1.24)
GFR calc non Af Amer: 13 mL/min — ABNORMAL LOW (ref >60)
GFR, EST AFRICAN AMERICAN: 15 mL/min — AB (ref >60)
Glucose, Bld: 161 mg/dL — ABNORMAL HIGH (ref 65–99)
POTASSIUM: 3.6 mmol/L (ref 3.5–5.1)
SODIUM: 128 mmol/L — AB (ref 135–145)

## 2016-05-15 LAB — SODIUM, URINE, RANDOM: Sodium, Ur: 24 mmol/L

## 2016-05-15 LAB — GLUCOSE, CAPILLARY: Glucose-Capillary: 180 mg/dL — ABNORMAL HIGH (ref 65–99)

## 2016-05-15 LAB — OSMOLALITY: Osmolality: 312 mOsm/kg — ABNORMAL HIGH (ref 275–295)

## 2016-05-15 LAB — OSMOLALITY, URINE: Osmolality, Ur: 322 mOsm/kg (ref 300–900)

## 2016-05-15 MED ORDER — DEXTROSE 5 % IV SOLN
1.0000 g | INTRAVENOUS | Status: DC
  Administered 2016-05-15: 1 g via INTRAVENOUS
  Filled 2016-05-15 (×2): qty 10

## 2016-05-15 MED ORDER — AZITHROMYCIN 500 MG PO TABS: 500.0000 mg | ORAL_TABLET | Freq: Every day | ORAL | Status: DC

## 2016-05-15 MED ORDER — METOLAZONE 2.5 MG PO TABS
2.5000 mg | ORAL_TABLET | Freq: Once | ORAL | Status: AC
  Administered 2016-05-15: 2.5 mg via ORAL
  Filled 2016-05-15: qty 1

## 2016-05-15 MED ORDER — TRAMADOL HCL 50 MG PO TABS
50.0000 mg | ORAL_TABLET | Freq: Four times a day (QID) | ORAL | Status: DC | PRN
  Administered 2016-05-15 – 2016-05-16 (×2): 50 mg via ORAL
  Filled 2016-05-15 (×2): qty 1

## 2016-05-15 MED ORDER — AZITHROMYCIN 250 MG PO TABS
250.0000 mg | ORAL_TABLET | Freq: Every day | ORAL | Status: DC
Start: 2016-05-16 — End: 2016-05-15

## 2016-05-15 MED ORDER — FUROSEMIDE 40 MG PO TABS
60.0000 mg | ORAL_TABLET | Freq: Two times a day (BID) | ORAL | Status: DC
  Administered 2016-05-15: 60 mg via ORAL
  Filled 2016-05-15: qty 1

## 2016-05-15 MED ORDER — POTASSIUM CHLORIDE CRYS ER 20 MEQ PO TBCR
40.0000 meq | EXTENDED_RELEASE_TABLET | Freq: Once | ORAL | Status: AC
  Administered 2016-05-15: 40 meq via ORAL
  Filled 2016-05-15: qty 2

## 2016-05-15 MED ORDER — ALBUTEROL SULFATE (2.5 MG/3ML) 0.083% IN NEBU
2.5000 mg | INHALATION_SOLUTION | RESPIRATORY_TRACT | Status: DC | PRN
  Administered 2016-05-15 – 2016-05-16 (×4): 2.5 mg via RESPIRATORY_TRACT
  Filled 2016-05-15 (×4): qty 3

## 2016-05-15 MED ORDER — FUROSEMIDE 10 MG/ML IJ SOLN
40.0000 mg | Freq: Two times a day (BID) | INTRAMUSCULAR | Status: DC
Start: 2016-05-15 — End: 2016-05-15
  Administered 2016-05-15: 40 mg via INTRAVENOUS
  Filled 2016-05-15: qty 4

## 2016-05-15 MED ORDER — DOXYCYCLINE HYCLATE 100 MG PO TABS
100.0000 mg | ORAL_TABLET | Freq: Two times a day (BID) | ORAL | Status: DC
  Administered 2016-05-15 (×2): 100 mg via ORAL
  Filled 2016-05-15 (×2): qty 1

## 2016-05-15 MED ORDER — DOXYCYCLINE HYCLATE 100 MG PO TABS: 100.0000 mg | ORAL_TABLET | Freq: Two times a day (BID) | ORAL | Status: DC

## 2016-05-15 MED ORDER — SALINE SPRAY 0.65 % NA SOLN
1.0000 | NASAL | Status: DC | PRN
  Filled 2016-05-15: qty 44

## 2016-05-15 NOTE — Consult Note (Signed)
Consultation Note Date: 05/15/2016   Patient Name: [name]  DOB: Nov 12, 1925  MRN: [id number]  Age / Sex: 82 y.o., male  PCP: Janith Lima, MD Referring Physician: Thurnell Lose, MD  Reason for Consultation: Establishing goals of care and Psychosocial/spiritual support  HPI/Patient Profile: 82 y.o. 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 Per chart review patient has been having progressive weakness and fatigue. On the morning of 04/30/2016 he was standing at the sink when he became weak and passed out. He has a history of persistent atrial fibrillation for which she is on amiodarone. He has advanced structural heart disease with atrial enlargement. He has had a history of GI bleed in the past. He is now in the Apixaban  validation study with Dr. Chase Caller. He has also had difficulty with reaccumulation of right pleural effusion and has previously had a Pleurx catheter in January which has been removed on 01/01/2016.   Per Dr. Jackalyn Lombard assessment syncopal episode of unclear etiology. His impression was that this was not due to an arrhythmic source. In his note he referenced profound metabolic derangements as well as being intravascularly  Depleted.Syncope most likely felt to be related to symptomatic hypotension. In his note  he references not a candidate for EP procedures, no indication for pacing  Clinical Assessment and Goals of Care: Pt is alert affect constricted. He appears fatigued and defers most of the conversation to his wife and his daughter. Patient and his wife moved to [place] from [place] and are now residing in [place] to be closer to their daughter. Patient has home health which they described as "palliative care" coming to the home. They are not currently under the service of a local hospice. Patient as well as the rest of his family reported sharp decline since December with his noted multiple hospitalizations. Prior to December he was driving, now is too weak. He has a home health aide that is been coming in to help him shower. His appetite has been very poor up until recently and he has been eating a little better over the past couple weeks his wife shares.  We spoke at length about the pathophysiology related to congestive heart failure and now with associated worsening renal function. We also talked about hospice providing a higher level of care for patient as well as his family  Patient is still making decisions for himself but he does seem to be deferring more for conversation to his wife and daughter. He has 2 sons that both live in [place]. His wife would be his surrogate decision maker with the  assistance of their children    SUMMARY OF RECOMMENDATIONS   Continue treatment in the hospital as directed by attending including thoracentesis if warranted. Patient is undergone right thoracentesis in the past Continue IV fluids as indicated as well as antibiotics When medically ready wished to go home with the support of hospice in the home Code Status/Advance Care Planning:  Limited code    Symptom Management:   Dyspnea: Continue targeted pulmonary treatments such as oxygen supportive measures for congestive heart failure. Did introduce the role of opioids for advanced disease to  manage shortness of breath we'll defer ordering at this time to hospice in the home  Palliative Prophylaxis:   Aspiration, Delirium Protocol, Oral Care and Turn Reposition  Additional Recommendations (Limitations, Scope, Preferences):  Avoid Hospitalization, No Artificial Feeding, No Hemodialysis, No Surgical Procedures and No Tracheostomy  Psycho-social/Spiritual:   Desire for further Chaplaincy support:no   Prognosis:   < 6 months in the setting of advanced systolic heart failure and now what appears to be developing cardiorenal syndrome, functional decline recent O2 dependency, weight loss  Discharge Planning: Home with Hospice      Primary Diagnoses: Present on Admission:  . AKI (acute kidney injury) ([place]) . Syncope . Chronic systolic CHF (congestive heart failure) ([place]) . Electrolyte disturbance . Type II diabetes mellitus with manifestations ([place]) . Hypothyroid . Acute on chronic systolic CHF (congestive heart failure) ([place])  I have reviewed the medical record, interviewed the patient and family, and examined the patient. The following aspects are pertinent.  Past Medical History  Diagnosis Date  . Acid reflux disease     history of  . H/O: GI bleed   . Persistent atrial fibrillation ([place])     a. Failed tikosyn. b. DCCV did not hold 07/2015.  Marland Kitchen Benign prostatic hypertrophy     history of  . Hepatic damage march 2009    retained hepatic stone , intrahepatic duct catheter  . CAD (coronary artery disease)     a. R/LHC 11/09/15: mild nonobstructive CAD ([facility]) with well- compensated hemodynamics with low filling pressures.  . Hypertension   . 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 Sarcoma ([place]) 1991    R triceps s/p resection, XRT and chemo  . Seizures ([place])   . Ventricular tachycardia ? Tikosyn proarrhtyhmia   . Cataracts, bilateral   . Diabetes mellitus     insulin dep  .  Pleural effusion 10/2015  . Acute on chronic systolic CHF (congestive heart failure) ([place])   . CKD (chronic kidney disease), stage II   . HCAP (healthcare-associated pneumonia) 12/10/2015  . Hypothyroid   . Pancreatic insufficiency ([place])   . Systolic CHF [facility])    Social History   Social History  . Marital Status: Married    Spouse Name: N/A  . Number of Children: 3  . Years of Education: N/A   Occupational History  . 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     Social History Main Topics  . Smoking status: Former Smoker    Types: Pipe    Quit date: 03/14/1991  . Smokeless tobacco: Never Used  . Alcohol Use: No  . Drug Use: No  . Sexual Activity: No   Other Topics Concern  . None   Social History Narrative   Dispensing optician. married 1954. 2 sons - [place]; 1 daughter 34. 7 grandchildren.  retired- Training and development officer.  End of Life Care: DNR, no prolonged intubation (more than 2-3 days), no sustaining tube feeding, no futile or heroic measures. (Provided signed Out of Facility order and unsigned MOST form 6/112/12)   Family History  Problem Relation Age of Onset  . Coronary artery disease Father 82  . Heart disease Father     fatal MI  . Osteoarthritis Mother 23  . Arthritis Mother   . Coronary artery disease Brother     cabg, avr, pvd with sents  . Heart disease Brother     CABG, PVD  . Peripheral vascular disease Sister 3  . Fibromyalgia Sister   . Arthritis Sister   . Coronary artery disease Sister   . Diabetes Sister   . Cancer Brother    Scheduled Meds: . amiodarone  200 mg Oral BID  . apixaban  2.5 mg Oral BID  . azelastine  2 spray Each Nare BID  . cefTRIAXone (ROCEPHIN)  IV  1 g Intravenous Q24H  . ciprofloxacin  2 drop Both Eyes BID  . doxycycline  100 mg Oral Q12H  . finasteride  5 mg Oral Daily  . levothyroxine  75 mcg Oral QAC breakfast  . lipase/protease/amylase  12,000 Units Oral TID AC  . metoprolol tartrate  25 mg Oral BID    . sodium chloride flush  3 mL Intravenous Q12H  . tamsulosin  0.4 mg Oral BID   Continuous Infusions:  PRN Meds:.albuterol, polyethylene glycol, sodium chloride Medications Prior to Admission:  Prior to Admission medications   Medication Sig Start Date End Date Taking? Authorizing Provider  Amino Acids-Protein Hydrolys (FEEDING SUPPLEMENT, PRO-STAT SUGAR FREE 64,) LIQD Take 30 mLs by mouth 2 (two) times daily.   Yes Historical Provider, MD  amiodarone (PACERONE) 200 MG tablet Take 1 tablet (200 mg total) by mouth 2 (two) times daily. 11/27/15  Yes Renee Dyane Dustman, PA-C  apixaban (ELIQUIS) 2.5 MG TABS tablet Take 1 tablet (2.5 mg total) by mouth 2 (two) times daily. 02/15/16  Yes Renee Dyane Dustman, PA-C  Ascorbic Acid (VITAMIN C) 500 MG tablet Take 500 mg by mouth daily.    Yes Historical Provider, MD  cholecalciferol (VITAMIN D) 1000 units tablet Take 1 tablet (1,000 Units total) by mouth daily. 04/08/16 04/08/17 Yes Evie Lacks Plotnikov, MD  digoxin (LANOXIN) 0.125 MG tablet Take 0.5 tablets (0.0625 mg total) by mouth every other day. 04/05/16  Yes Bonnell Public, MD  ergocalciferol (VITAMIN D2) 50000 units capsule Take 1 capsule (50,000 Units total) by mouth once a week. 04/08/16  Yes Evie Lacks Plotnikov, MD  finasteride (PROSCAR) 5 MG tablet Take 1 tablet (5 mg total) by mouth daily. 06/10/15  Yes Janith Lima, MD  insulin aspart (NOVOLOG FLEXPEN) 100 UNIT/ML FlexPen Inject into the skin See admin instructions. Use 7 units at breakfast, use 7  units at lunch and use  7 units at supper   Yes Historical Provider, MD  insulin detemir (LEVEMIR) 100 UNIT/ML injection Inject 1 Units into the skin See admin instructions. Patient ONLY uses for sliding scale. 1-2 units is the max for the sliding scale.  If CBG is 150 or above   Yes Historical Provider, MD  levothyroxine (SYNTHROID, LEVOTHROID) 75 MCG tablet Take 1 tablet (75 mcg total) by mouth daily. 05/11/16  Yes Janith Lima, MD   lipase/protease/amylase (CREON) 12000 units CPEP capsule Take 1 capsule by mouth 3  times a day before meals 03/07/16  Yes Thomas L  Ronnald Ramp, MD  metoprolol tartrate (LOPRESSOR) 25 MG tablet Take 1 tablet (25 mg total) by mouth 2 (two) times daily. 12/01/15  Yes Renee Dyane Dustman, PA-C  Multiple Vitamin (MULTIVITAMIN WITH MINERALS) TABS tablet Take 1 tablet by mouth daily.   Yes Historical Provider, MD  potassium chloride (KLOR-CON) 8 MEQ tablet Take 1 tablet (8 mEq total) by mouth 3 (three) times daily. 05/10/16  Yes Janith Lima, MD  spironolactone (ALDACTONE) 25 MG tablet Take 1 tablet (25 mg total) by mouth daily. 05/10/16  Yes Janith Lima, MD  tamsulosin (FLOMAX) 0.4 MG CAPS capsule Take 1 capsule (0.4 mg total) by mouth 2 (two) times daily. 06/10/15  Yes Janith Lima, MD  torsemide (DEMADEX) 10 MG tablet Take 2 tablets (20 mg total) by mouth daily. 04/20/16  Yes Janith Lima, MD  azelastine (ASTELIN) 0.1 % nasal spray Place 2 sprays into both nostrils 2 (two) times daily. Use in each nostril as directed Patient not taking: Reported on 05/16/2016 10/21/15   Janith Lima, MD   Allergies  Allergen Reactions  . Lisinopril Cough  . Toujeo Solostar [Insulin Glargine] Other (See Comments)    Shaking, felt bad all over  . Clindamycin Diarrhea  . Other Other (See Comments)    toujeo with perioral tingling only - pt refuses to take further  . Oxycodone-Acetaminophen Diarrhea  . Penicillins Hives    Has patient had a PCN reaction causing immediate rash, facial/tongue/throat swelling, SOB or lightheadedness with hypotension: Yes Has patient had a PCN reaction causing severe rash involving mucus membranes or skin necrosis: no  Has patient had a PCN reaction that required hospitalization No Has patient had a PCN reaction occurring within the last 10 years: No If all of the above answers are "NO", then may proceed with Cephalosporin use.   Review of Systems  Constitutional: Positive for activity  change and fatigue.  HENT: Negative.   Eyes: Negative.   Respiratory: Positive for cough and shortness of breath.   Cardiovascular:       Syncopal episode  Endocrine: Negative.   Genitourinary: Negative.   Musculoskeletal: Negative.   Allergic/Immunologic: Negative.   Neurological: Positive for weakness.  Hematological: Negative.   Psychiatric/Behavioral:       Tired    Physical Exam  Constitutional: He is oriented to person, place, and time.  Frail elderly cachetic man  HENT:  Head: Normocephalic and atraumatic.  Cardiovascular:  irrg  Pulmonary/Chest: Effort normal.  Neurological: He is alert and oriented to person, place, and time.  Skin: Skin is warm.  Psychiatric:  Constricted affect  Nursing note and vitals reviewed.   Vital Signs: BP 88/45 mmHg  Pulse 62  Temp(Src) 97.5 F (36.4 C) (Oral)  Resp 18  Ht 6\' 3"  (1.905 m)  Wt 79.017 kg (174 lb 3.2 oz)  BMI 21.77 kg/m2  SpO2 97% Pain Assessment: No/denies pain   Pain Score: 0-No pain   SpO2: SpO2: 97 % O2 Device:SpO2: 97 % O2 Flow Rate: .O2 Flow Rate (L/min): 5 L/min  IO: Intake/output summary:  Intake/Output Summary (Last 24 hours) at 05/15/16 1452 Last data filed at 05/15/16 1100  Gross per 24 hour  Intake    483 ml  Output    775 ml  Net   -292 ml    LBM: Last BM Date: 05/08/2016 Baseline Weight: Weight: 79.017 kg (174 lb 3.2 oz) Most recent weight: Weight: 79.017 kg (174 lb 3.2 oz)     Palliative Assessment/Data:  Flowsheet Rows        Most Recent Value   Intake Tab    Referral Department  Hospitalist   Unit at Time of Referral  Cardiac/Telemetry Unit   Palliative Care Primary Diagnosis  Cardiac   Date Notified  05/15/16   Palliative Care Type  New Palliative care   Reason for referral  Clarify Goals of Care   Date of Admission  05/21/2016   Date first seen by Palliative Care  05/15/16   # of days Palliative referral response time  0 Day(s)   # of days IP prior to Palliative referral  1    Clinical Assessment    Palliative Performance Scale Score  40%   Psychosocial & Spiritual Assessment    Palliative Care Outcomes       Time In: 1530 Time Out: 1645 Time Total: 75 min Greater than 50%  of this time was spent counseling and coordinating care related to the above assessment and plan.  Signed by: Dory Horn, NP   Please contact Palliative Medicine Team phone at [phone] for questions and concerns.  For individual provider: See Shea Evans

## 2016-05-15 NOTE — Progress Notes (Signed)
  Echocardiogram 2D Echocardiogram has been performed.  [name] 05/15/2016, 2:22 PM

## 2016-05-15 NOTE — Progress Notes (Signed)
Patient continue to desat even on 5L 87-88%.. RT , RRT, MD, Charge nurse and family members notified. Patient on non-rebreather mask high flow 15%.  Patient having nose bleeding ,coughing out blood and B/P 90/30  MD made aware. Rechecked O2 90-91%  Family members at bedside.

## 2016-05-15 NOTE — Progress Notes (Signed)
Due to desat on 5 L, RT attempted high flow nasal cannula at 14 L, sat only 87%. RT notified RN and placed patient on a NRB mask., sat 93%.

## 2016-05-15 NOTE — Consult Note (Signed)
ELECTROPHYSIOLOGY CONSULT NOTE    Primary Care Physician: [name], MD Referring Physician:  Dr Candiss Norse  Admit Date: 05/19/2016  Reason for consultation:   syncope  [name] is a 82 y.o. male with a h/o advanced renal failure, chronic systolic heart failure, and persistent atrial fibrillation who is admitted with syncope.  He has had a down hill course since December with multiple hospitalizations.  He is followed by Dr Caryl Comes and also primary care and has hospice nursing coming to his home.  5 days ago, he was evaluated by primary care and metolazone was discontinued.  He reports progressive weakness and fatigue since that time.   Yesterday morning, he was standing at the sink when he became weak and passed out.  He woke with persistent weakness.  He was brought to [facility] for further management. He has a h/o persistent atrial fibrillation for which he is on amiodarone.  He has advanced structural heart disease with atrial enlargement.  He has had bleeding in the past.  He is now in the Apixaban Validation Study (ClinicalTrials.gov Identifier: [id number]) with Dr Lynford Citizen. He has also had difficulty with reacumlation of R pleural effusion previously has had a pleurX catheter in January.  This was removed 01/01/16.  Today, he denies symptoms of palpitations, chest pain, shortness of breath, orthopnea, PND, lower extremity edema, dizziness, presyncope, syncope, or neurologic sequela. The patient is tolerating medications without difficulties and is otherwise without complaint today.   Past Medical History  Diagnosis Date  . Acid reflux disease     history of  . H/O: GI bleed   . Persistent atrial fibrillation ([place])     a. Failed tikosyn. b. DCCV did not hold 07/2015.  Marland Kitchen Benign prostatic hypertrophy     history of  . Hepatic damage march 2009    retained hepatic stone , intrahepatic duct catheter  . CAD (coronary artery disease)     a. R/LHC 11/09/15: mild nonobstructive CAD ([facility])  with well- compensated hemodynamics with low filling pressures.  . Hypertension   . 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 Seizures ([place])   . Ventricular tachycardia ? Tikosyn proarrhtyhmia   . Cataracts, bilateral   . Diabetes mellitus     insulin dep  . Pleural effusion 10/2015  . Acute on chronic systolic CHF (congestive heart failure) ([place])   . CKD (chronic kidney disease), stage II   . HCAP (healthcare-associated pneumonia) 12/10/2015  . Hypothyroid   . Pancreatic insufficiency ([place])   . Systolic CHF [facility])    Past Surgical History  Procedure Laterality Date  . Cataract extraction w/ intraocular lens  implant, bilateral Bilateral ~ 2014  . Cholecystectomy    . Partial gastrectomy    . Orif shoulder fracture Left 1941    2 pens inserted  . Whipple procedure      operation  . Tumor excision Right     sarcoma removal from tricep  . Inguinal hernia repair Right X 2  . Appendectomy    . Cardioversion  03/07/2012    Procedure: CARDIOVERSION;  Surgeon: Deboraha Sprang, MD;  Location: [place];  Service: Cardiovascular;  Laterality: N/A;  . Cardioversion N/A 08/10/2015    Procedure: CARDIOVERSION;  Surgeon: Thayer Headings, MD;  Location: [place];  Service: Cardiovascular;  Laterality: N/A;  .  Cardiac catheterization N/A 11/09/2015    Procedure: Right/Left Heart Cath and Coronary Angiography;  Surgeon: Jolaine Artist, MD;  Location: [place] CV LAB;  Service: Cardiovascular;  Laterality: N/A;  . Chest tube insertion Right 12/04/2015    Procedure: INSERTION RIGHT PLEURAL DRAINAGE CATHETER;  Surgeon: Gaye Pollack, MD;  Location: [place] OR;  Service: Thoracic;  Laterality: Right;  . Fracture surgery    . Thoracentesis    . Removal of pleural drainage catheter Right 01/01/2016    Procedure: REMOVAL OF PLEURAL DRAINAGE CATHETER;  Surgeon:  Gaye Pollack, MD;  Location: [facility] OR;  Service: Thoracic;  Laterality: Right;    . amiodarone  200 mg Oral BID  . apixaban  2.5 mg Oral BID  . azelastine  2 spray Each Nare BID  . ciprofloxacin  2 drop Both Eyes BID  . finasteride  5 mg Oral Daily  . levothyroxine  75 mcg Oral QAC breakfast  . lipase/protease/amylase  12,000 Units Oral TID AC  . metoprolol tartrate  25 mg Oral BID  . sodium chloride flush  3 mL Intravenous Q12H  . tamsulosin  0.4 mg Oral BID      Allergies  Allergen Reactions  . Lisinopril Cough  . Toujeo Solostar [Insulin Glargine] Other (See Comments)    Shaking, felt bad all over  . Clindamycin Diarrhea  . Other Other (See Comments)    toujeo with perioral tingling only - pt refuses to take further  . Oxycodone-Acetaminophen Diarrhea  . Penicillins Hives    Has patient had a PCN reaction causing immediate rash, facial/tongue/throat swelling, SOB or lightheadedness with hypotension: Yes Has patient had a PCN reaction causing severe rash involving mucus membranes or skin necrosis: no  Has patient had a PCN reaction that required hospitalization No Has patient had a PCN reaction occurring within the last 10 years: No If all of the above answers are "NO", then may proceed with Cephalosporin use.    Social History   Social History  . Marital Status: Married    Spouse Name: N/A  . Number of Children: 3  . Years of Education: N/A   Occupational History  . 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     Social History Main Topics  . Smoking status: Former Smoker    Types: Pipe    Quit date: 03/14/1991  . Smokeless tobacco: Never Used  . Alcohol Use: No  . Drug Use: No  . Sexual Activity: No   Other Topics Concern  . Not on file   Social History Narrative   [employer] -IT sales professional. married 1954. 2 sons - [place]; 1 daughter 42. 7 grandchildren. retired- Training and development officer.  End of Life Care: DNR, no prolonged intubation (more than 2-3  days), no sustaining tube feeding, no futile or heroic measures. (Provided signed Out of Facility order and unsigned MOST form 6/112/12)    Family History  Problem Relation Age of Onset  . Coronary artery disease Father 40  . Heart disease Father     fatal MI  . Osteoarthritis Mother 34  . Arthritis Mother   . Coronary artery disease Brother     cabg, avr, pvd with sents  . Heart disease Brother     CABG, PVD  . Peripheral vascular disease Sister 54  . Fibromyalgia Sister   . Arthritis Sister   . Coronary artery disease Sister   . Diabetes Sister   . Cancer Brother  ROS- All systems are reviewed and negative except as per the HPI above  Physical Exam: Telemetry: sinus rhythm 60s, telemetry in undersensing R waves and inaccurately calling brady events Filed Vitals:   05/15/16 0829 05/15/16 0832 05/15/16 0840 05/15/16 0955  BP:      Pulse:    66  Temp:      TempSrc:      Resp:      Height:      Weight:      SpO2: 91% 88% 92% 94%    GEN- The patient is elderly and frail appearing, alert and oriented x 3 today.   Head- normocephalic, atraumatic Eyes-  Sclera clear, conjunctiva pink Ears- hearing intact Oropharynx- clear Neck- supple, JVP to jaw Lungs- decreased BS at bases bilaterally, normal work of breathing Heart- Regular rate and rhythm  GI- soft, NT, ND, + BS Extremities- no clubbing, cyanosis, + dependant edema MS- diffuse atrophy Skin- no rash or lesion Psych- euthymic mood, full affect Neuro- strength and sensation are intact  EKG- I have reviewed ekgs from yesterday in EPIC. These are sinus bradycardia with low amplitude p waves and qrs.  Labs:   Lab Results  Component Value Date   WBC 6.6 05/18/2016   HGB 11.1* 04/29/2016   HCT 33.4* 05/02/2016   MCV 95.2 05/10/2016   PLT 119* 05/27/2016    Recent Labs Lab 05/15/16 0623  NA 128*  K 3.6  CL 85*  CO2 30  BUN 97*  CREATININE 3.70*  CALCIUM 8.1*  GLUCOSE 161*   Lab Results  Component  Value Date   CKTOTAL 42 11/04/2015   CKMB 2.5 11/04/2015   TROPONINI 0.08* 04/01/2016    Lab Results  Component Value Date   CHOL 114 10/21/2015   CHOL 129 10/27/2014   CHOL 136 03/19/2014   Lab Results  Component Value Date   HDL 46.20 10/21/2015   HDL 39.20 10/27/2014   HDL 41.70 03/19/2014   Lab Results  Component Value Date   LDLCALC 56 10/21/2015   LDLCALC 76 10/27/2014   LDLCALC 81 03/19/2014   Lab Results  Component Value Date   TRIG 59.0 10/21/2015   TRIG 67.0 10/27/2014   TRIG 65.0 03/19/2014   Lab Results  Component Value Date   CHOLHDL 2 10/21/2015   CHOLHDL 3 10/27/2014   CHOLHDL 3 03/19/2014   No results found for: LDLDIRECT    Radiology: pleural effusion note, increased bibasilar interstitial densities are noted concerning for possible edema  Echo 12/16 reviewed  ASSESSMENT AND PLAN:   1. Syncope Unclear etiology I am not convinced that this is due to an arrhythmic source.  He has profound metabolic derangements and likely is somewhat intravascularly deplete.  Syncope is most likely due to symptomatic hypotension.   Not a candidate for EP procedures.  No indication for pacing.  2. Persistent atrial fibrillation Despite extensive structural heart disease and atriopathy, he appears to be maintaining sinus rhythm.  Though cardiology admit note suggests afib in ed ekg, I do not see this on admission ekgs. Continue amiodarone Consider reducing to 200mg  daily (will defer to Dr Caryl Comes) chads2vasc score is at least 6.  He is at very high risk for anticoagulation however.  The patient is in the Apixaban Validation Study (ClinicalTrials.gov Identifier: [id number]) with Dr Lynford Citizen for this.  Will need to discuss trial with Dr Lynford Citizen in am to see what direction to take.  3. Acute on chronic renal failure Likely cardiorenal syndrome Hold digoxin may  have to stop eliquis.  Will need to discuss trial with Dr Lynford Citizen in am to see what direction to take  given that he is in a clinical trial. Hold lasix and follow renal function  4. Acute on chronic combined systolic and diastolic CHF The patient has advanced heart failure.  I worry that our options are quite limited.  Will consult palliative care.  Dependant on the course of action he chooses, would consider consultation of advanced heart failure team given frequent hospitalizations.  I think ultimately home with hospice would be a more prudent decision.  5. Metabolic derangements Hyponatremia, hypoalbuminemia, advanced renal failure, lactic acidosis, etc Very poor prognosis,  Failure to thrive over the past 6 months.  He wishes to be DNI but would like shocks, CPR if needed. Will consult palliative care for goals of care  The patient is very ill with multiple acute medical issues.  A high level of decision making was required for this encounter.   Thompson Grayer, MD 05/15/2016  10:48 AM

## 2016-05-15 NOTE — Progress Notes (Signed)
Patient arrived to 3E14 from [facility]. Alert and oriented X 4. No complaints of pain. Admitted for Syncope/Fall. SR/SB on telemetry. Patient has multiple bruises to both arms and back. IV to right forearm removed site appeared swollen. Bed alarm set. Patient remined to call for assistance and call bell placed within reach.

## 2016-05-15 NOTE — Progress Notes (Addendum)
PROGRESS NOTE                                                                                                                                                                                                             Patient Demographics:    [name], is a 82 y.o. male, DOB - 1925/07/14, [id number]  Admit date - 05/05/2016   Admitting Physician Maren Reamer, MD  Outpatient Primary MD for the patient is [name], MD  LOS - 1  Chief Complaint  Patient presents with  . Loss of Consciousness       Brief Narrative    [name] is a 82 y.o. male with medical history significant for chronic systolic heart failure, recurrent right-sided pleural effusion, CK D5 baseline creatinine close to 2.5, diabetes, ventricular arrhythmias, and thyroid disease. Admitted for a syncopal episode. Over the last few days patient has become increasingly weak and correlates this time wise with change in diuretics. Metolazone was discontinued, Aldactone started a few days ago. Patient gained 1.5 pounds yesterday. He notes decreased urinary output last night from his typical 200 cc to about 30 cc. No change in baseline shortness of breath. In the ER he was diagnosed with A. fib with RVR. Cardiology was consulted, he also had evidence of decompensated CHF and ARF on CK D.    Subjective:    [name] today has, No headache, No chest pain, No abdominal pain - No Nausea, No new weakness tingling or numbness, No Cough - + SOB.   Assessment  & Plan :     1.Syncope per cardiology suspicious for V. tach. Does have history of A. fib. Also noted to be baseline hypotensive, currently on combination of amiodarone, digoxin and beta blocker which will be continued. Monitor on telemetry. Further workup per cardiology.  2. Acute on chronic systolic heart failure EF 25-30%. Continue IV Lasix as tolerated by his blood pressure, continue  low-dose beta blocker and Aldactone, no ACE/ARB due to renal failure, also blood low for adding other medications. Continue daily weights, intake and output monitoring. Seen by Cards, long term prognosis very poor , Cards has called Palliative care as well.  Addendum - 5.35pm talked with wife, explained poor outcome likely, agrees with Med Rx, patient is a poor BIPAP candidate as high risk for aspiration, No  intubation, Med Rx, if declines further full comfort care, eventually want DC home with hospice.  3. Chronic atrial fibrillation. [place] vasc 2 score of at least 5. On combination of amiodarone, digoxin and low-dose beta blocker along with Eliquis.  4. Recurrent right>left-sided pleural effusion. Worsened by #2 above, outpatient follow by pulmonary. For now diuresis and supportive care.  5. Hyponatremia along with ARF on CK D stage V. Baseline creatinine close to 2.5, this is due to CHF decompensation, diurese and monitor.  6. Hypothyroidism. On Synthroid.  7. BPH. On Flomax.   8. L sided infiltrate on CXR - Likely assymetric edema, No leukocytosis or fever, will check sputum culture and place on Zpack.  9. DM type II. On Levemir along with sliding scale  Lab Results  Component Value Date   HGBA1C 6.1 05/10/2016   CBG (last 3)   Recent Labs  05/19/2016 1229 05/16/2016 2056 05/13/2016 2354  GLUCAP 116* 139* 179*      Code Status :   No intubation  Family Communication  :  None present, called daughter's cell >10 rings no response.   Disposition Plan  :  Stay on Tele  Consults  :  Cards, Pall care  Procedures  :    CT head nonacute  Echogram December 2016 with EF of 25-30%. Diffuse hypokinesis.  DVT Prophylaxis  :  Eliquis  Lab Results  Component Value Date   PLT 119* 05/25/2016    Inpatient Medications  Scheduled Meds: . amiodarone  200 mg Oral BID  . apixaban  2.5 mg Oral BID  . azelastine  2 spray Each Nare BID  . ciprofloxacin  2 drop Both Eyes BID  .  digoxin  0.0625 mg Oral QODAY  . finasteride  5 mg Oral Daily  . furosemide  40 mg Intravenous Q12H  . levothyroxine  75 mcg Oral QAC breakfast  . lipase/protease/amylase  12,000 Units Oral TID AC  . metoprolol tartrate  25 mg Oral BID  . sodium chloride flush  3 mL Intravenous Q12H  . tamsulosin  0.4 mg Oral BID   Continuous Infusions:  PRN Meds:.albuterol, polyethylene glycol, sodium chloride  Antibiotics  :    Anti-infectives    Start     Dose/Rate Route Frequency Ordered Stop   05/21/2016 1400  vancomycin (VANCOCIN) IVPB 1000 mg/200 mL premix     1,000 mg 200 mL/hr over 60 Minutes Intravenous  Once 04/29/2016 1339 05/08/2016 1651   05/03/2016 1345  ceFEPIme (MAXIPIME) 1 g in dextrose 5 % 50 mL IVPB     1 g 100 mL/hr over 30 Minutes Intravenous STAT 05/10/2016 1339 05/12/2016 1522   05/10/2016 1345  vancomycin (VANCOCIN) 500 mg in sodium chloride 0.9 % 100 mL IVPB     500 mg 100 mL/hr over 60 Minutes Intravenous  Once 05/16/2016 1343 05/26/2016 1651         Objective:   Filed Vitals:   05/15/16 0817 05/15/16 0829 05/15/16 0832 05/15/16 0840  BP:      Pulse: 63     Temp:      TempSrc:      Resp: 18     Height:      Weight:      SpO2: 93% 91% 88% 92%    Wt Readings from Last 3 Encounters:  05/13/2016 79.017 kg (174 lb 3.2 oz)  05/10/16 75.751 kg (167 lb)  04/26/16 72.757 kg (160 lb 6.4 oz)     Intake/Output Summary (Last 24 hours) at  05/15/16 0910 Last data filed at 05/15/16 0843  Gross per 24 hour  Intake   1480 ml  Output    625 ml  Net    855 ml     Physical Exam  Awake Alert, Oriented X 3, No new F.N deficits, Normal affect [place].AT,PERRAL Supple Neck,No JVD, No cervical lymphadenopathy appriciated.  Symmetrical Chest wall movement, Good air movement bilaterally, CTAB RRR,No Gallops,Rubs or new Murmurs, No Parasternal Heave +ve B.Sounds, Abd Soft, No tenderness, No organomegaly appriciated, No rebound - guarding or rigidity. No Cyanosis, Clubbing or edema, No new Rash  or bruise       Data Review:    CBC  Recent Labs Lab 05/10/16 1038 05/04/2016 1219  WBC 5.9 6.6  HGB 11.3* 11.1*  HCT 33.2* 33.4*  PLT 114.0* 119*  MCV 96.0 95.2  MCH  --  31.6  MCHC 34.0 33.2  RDW 16.9* 16.6*  LYMPHSABS 0.7  --   MONOABS 1.0  --   EOSABS 0.0  --   BASOSABS 0.0  --     Chemistries   Recent Labs Lab 05/10/16 1038 04/29/2016 1200 04/28/2016 1219 05/15/16 0623  NA 129*  --  125* 128*  K 2.8 Repeated and verified X2.*  --  4.0 3.6  CL 83*  --  83* 85*  CO2 38*  --  31 30  GLUCOSE 121*  --  124* 161*  BUN 96 Repeated and verified X2.*  --  103* 97*  CREATININE 3.21*  --  3.99* 3.70*  CALCIUM 8.2*  --  8.1* 8.1*  MG  --  2.6*  --   --    ------------------------------------------------------------------------------------------------------------------ No results for input(s): CHOL, HDL, LDLCALC, TRIG, CHOLHDL, LDLDIRECT in the last 72 hours.  Lab Results  Component Value Date   HGBA1C 6.1 05/10/2016   ------------------------------------------------------------------------------------------------------------------  Recent Labs  05/18/2016 1220  TSH 7.627*   ------------------------------------------------------------------------------------------------------------------ No results for input(s): VITAMINB12, FOLATE, FERRITIN, TIBC, IRON, RETICCTPCT in the last 72 hours.  Coagulation profile  Recent Labs Lab 05/25/2016 1200  INR 1.62*    No results for input(s): DDIMER in the last 72 hours.  Cardiac Enzymes No results for input(s): CKMB, TROPONINI, MYOGLOBIN in the last 168 hours.  Invalid input(s): CK ------------------------------------------------------------------------------------------------------------------    Component Value Date/Time   BNP 535.2* 05/20/2016 1219    Micro Results No results found for this or any previous visit (from the past 240 hour(s)).  Radiology Reports Ct Head Wo Contrast  05/16/2016  CLINICAL DATA:   Witnessed syncopal episode and fall this morning. EXAM: CT HEAD WITHOUT CONTRAST TECHNIQUE: Contiguous axial images were obtained from the base of the skull through the vertex without intravenous contrast. COMPARISON:  Head CT 07/06/2013 FINDINGS: Brain: No evidence of acute infarction, hemorrhage, extra-axial collection, ventriculomegaly, or mass effect. There is moderate brain parenchymal atrophy and mild deep white matter microvascular ischemia. Vascular: No hyperdense vessel or unexpected calcification. Skull: Negative for fracture or focal lesion. Sinuses/Orbits: No acute findings. Other: None. IMPRESSION: No acute intracranial abnormality. Atrophy, chronic microvascular disease. Electronically Signed   By: Fidela Salisbury M.D.   On: 05/15/2016 17:39   Dg Chest Portable 1 View  05/13/2016  CLINICAL DATA:  Hypotension. EXAM: PORTABLE CHEST 1 VIEW COMPARISON:  Radiograph of Apr 01, 2016. FINDINGS: Stable cardiomegaly. No pneumothorax is noted. Mild loculated pleural effusion is seen over the right upper lobe. Increased bibasilar interstitial densities are noted concerning for possible edema. No significant pleural effusion is noted. Bony thorax is  unremarkable. IMPRESSION: Mild loculated right pleural effusion is seen over the right upper lobe. Increased bibasilar interstitial densities are noted concerning for possible edema. Electronically Signed   By: Marijo Conception, M.D.   On: 05/13/2016 14:13    Time Spent in minutes  30   SINGH,PRASHANT K M.D on 05/15/2016 at 9:10 AM  Between 7am to 7pm - Pager - [phone]  After 7pm go to [email or web address] - password [email or web address]  [facility] -  Office  [phone]

## 2016-05-16 ENCOUNTER — Encounter: Payer: Self-pay | Admitting: *Deleted

## 2016-05-16 ENCOUNTER — Other Ambulatory Visit: Payer: Self-pay | Admitting: *Deleted

## 2016-05-16 ENCOUNTER — Other Ambulatory Visit: Payer: Self-pay

## 2016-05-16 LAB — BASIC METABOLIC PANEL
Anion gap: 13 (ref 5–15)
BUN: 96 mg/dL — AB (ref 6–20)
CO2: 27 mmol/L (ref 22–32)
CREATININE: 3.31 mg/dL — AB (ref 0.61–1.24)
Calcium: 8.3 mg/dL — ABNORMAL LOW (ref 8.9–10.3)
Chloride: 89 mmol/L — ABNORMAL LOW (ref 101–111)
GFR calc Af Amer: 18 mL/min — ABNORMAL LOW (ref >60)
GFR, EST NON AFRICAN AMERICAN: 15 mL/min — AB (ref >60)
GLUCOSE: 193 mg/dL — AB (ref 65–99)
Potassium: 3.7 mmol/L (ref 3.5–5.1)
SODIUM: 129 mmol/L — AB (ref 135–145)

## 2016-05-16 LAB — CBC
HEMATOCRIT: 33.1 % — AB (ref 39.0–52.0)
HEMOGLOBIN: 11.4 g/dL — AB (ref 13.0–17.0)
MCH: 31.8 pg (ref 26.0–34.0)
MCHC: 34.4 g/dL (ref 30.0–36.0)
MCV: 92.5 fL (ref 78.0–100.0)
Platelets: 114 10*3/uL — ABNORMAL LOW (ref 150–400)
RBC: 3.58 MIL/uL — ABNORMAL LOW (ref 4.22–5.81)
RDW: 16.9 % — AB (ref 11.5–15.5)
WBC: 12.2 10*3/uL — ABNORMAL HIGH (ref 4.0–10.5)

## 2016-05-16 LAB — MAGNESIUM: MAGNESIUM: 2.2 mg/dL (ref 1.7–2.4)

## 2016-05-16 LAB — TROPONIN I
TROPONIN I: 0.09 ng/mL — AB (ref <0.031)
TROPONIN I: 0.08 ng/mL — AB (ref <0.031)

## 2016-05-16 MED ORDER — LORAZEPAM 2 MG/ML PO CONC: 0.5000 mg | ORAL | Status: DC | PRN

## 2016-05-16 MED ORDER — MORPHINE SULFATE (PF) 2 MG/ML IV SOLN
2.0000 mg | INTRAVENOUS | Status: DC | PRN
  Filled 2016-05-16: qty 1

## 2016-05-16 MED ORDER — MORPHINE SULFATE (PF) 2 MG/ML IV SOLN
1.0000 mg | Freq: Once | INTRAVENOUS | Status: AC
Start: 2016-05-16 — End: 2016-05-16
  Administered 2016-05-16: 1 mg via INTRAVENOUS
  Filled 2016-05-16: qty 1

## 2016-05-16 MED ORDER — FUROSEMIDE 10 MG/ML IJ SOLN
80.0000 mg | Freq: Once | INTRAMUSCULAR | Status: AC
  Administered 2016-05-16: 80 mg via INTRAVENOUS

## 2016-05-16 MED ORDER — ACETAMINOPHEN 325 MG PO TABS: 650.0000 mg | ORAL_TABLET | Freq: Four times a day (QID) | ORAL | Status: DC | PRN

## 2016-05-16 MED ORDER — BIOTENE DRY MOUTH MT LIQD: 15.0000 mL | OROMUCOSAL | Status: DC | PRN

## 2016-05-16 MED ORDER — ONDANSETRON 4 MG PO TBDP
4.0000 mg | ORAL_TABLET | Freq: Four times a day (QID) | ORAL | Status: DC | PRN
  Filled 2016-05-16: qty 1

## 2016-05-16 MED ORDER — GLYCOPYRROLATE 0.2 MG/ML IJ SOLN
0.2000 mg | INTRAMUSCULAR | Status: DC | PRN
  Filled 2016-05-16: qty 1

## 2016-05-16 MED ORDER — LORAZEPAM 0.5 MG PO TABS: 0.5000 mg | ORAL_TABLET | ORAL | Status: DC | PRN

## 2016-05-16 MED ORDER — ONDANSETRON HCL 4 MG/2ML IJ SOLN: 4.0000 mg | Freq: Four times a day (QID) | INTRAMUSCULAR | Status: DC | PRN

## 2016-05-16 MED ORDER — METOLAZONE 2.5 MG PO TABS
2.5000 mg | ORAL_TABLET | Freq: Once | ORAL | Status: AC
  Administered 2016-05-16: 2.5 mg via ORAL
  Filled 2016-05-16: qty 1

## 2016-05-16 MED ORDER — GLYCOPYRROLATE 1 MG PO TABS
1.0000 mg | ORAL_TABLET | ORAL | Status: DC | PRN
  Filled 2016-05-16: qty 1

## 2016-05-16 MED ORDER — ACETAMINOPHEN 650 MG RE SUPP: 650.0000 mg | Freq: Four times a day (QID) | RECTAL | Status: DC | PRN

## 2016-05-16 MED ORDER — FUROSEMIDE 10 MG/ML IJ SOLN: 40.0000 mg | Freq: Two times a day (BID) | INTRAMUSCULAR | Status: DC

## 2016-05-16 MED ORDER — FUROSEMIDE 40 MG PO TABS
40.0000 mg | ORAL_TABLET | Freq: Two times a day (BID) | ORAL | Status: DC
  Filled 2016-05-16: qty 1

## 2016-05-16 MED ORDER — OXYCODONE HCL 20 MG/ML PO CONC: 5.0000 mg | ORAL | Status: DC | PRN

## 2016-05-16 MED ORDER — POLYVINYL ALCOHOL 1.4 % OP SOLN
1.0000 [drp] | Freq: Four times a day (QID) | OPHTHALMIC | Status: DC | PRN
  Filled 2016-05-16: qty 15

## 2016-05-16 MED ORDER — OXYCODONE HCL 20 MG/ML PO CONC: 5.0000 mg | Freq: Four times a day (QID) | ORAL | Status: DC

## 2016-05-16 NOTE — Progress Notes (Signed)
SUBJECTIVE: The patient's shortness of breath is worse this morning.   CURRENT MEDICATIONS: . amiodarone  200 mg Oral BID  . apixaban  2.5 mg Oral BID  . azelastine  2 spray Each Nare BID  . cefTRIAXone (ROCEPHIN)  IV  1 g Intravenous Q24H  . ciprofloxacin  2 drop Both Eyes BID  . doxycycline  100 mg Oral Q12H  . finasteride  5 mg Oral Daily  . furosemide  40 mg Intravenous BID  . furosemide  80 mg Intravenous Once  . levothyroxine  75 mcg Oral QAC breakfast  . lipase/protease/amylase  12,000 Units Oral TID AC  . metolazone  2.5 mg Oral Once  . metoprolol tartrate  25 mg Oral BID  . sodium chloride flush  3 mL Intravenous Q12H  . tamsulosin  0.4 mg Oral BID      OBJECTIVE: Physical Exam: Filed Vitals:   05/16/16 0051 05/16/16 0131 05/16/16 0442 05/16/16 0745  BP: 95/81 101/41 85/42 93/35   Pulse: 64 65 64   Temp: 97.5 F (36.4 C) 97.7 F (36.5 C) 97.5 F (36.4 C)   TempSrc: Oral Oral Oral   Resp: 15 20 15    Height:      Weight:   171 lb 8 oz (77.792 kg)   SpO2: 90% 91% 91% 90%    Intake/Output Summary (Last 24 hours) at 05/16/16 [id number] Last data filed at 05/16/16 0449  Gross per 24 hour  Intake    243 ml  Output    700 ml  Net   -457 ml    Telemetry reveals sinus rhythm  GEN- The patient is chronically ill appearing, alert and oriented x 3 today, +SOB at rest  Paradoxical braething  Head- normocephalic, atraumatic Eyes-  Sclera clear, conjunctiva pink Ears- hearing intact Oropharynx- clear Neck- supple  Lungs- Increased work of breathing, diffuse rhonchi, +accessory muscle use Heart- Regular rate and rhythm  GI- soft, NT, ND, + BS Extremities- no clubbing, cyanosis, or edema Skin- no rash or lesion Psych- euthymic mood, full affect Neuro- strength and sensation are intact  LABS: Basic Metabolic Panel:  Recent Labs  05/06/2016 1200  05/15/16 0623 05/16/16 0207  NA  --   < > 128* 129*  K  --   < > 3.6 3.7  CL  --   < > 85* 89*  CO2  --   < > 30  27  GLUCOSE  --   < > 161* 193*  BUN  --   < > 97* 96*  CREATININE  --   < > 3.70* 3.31*  CALCIUM  --   < > 8.1* 8.3*  MG 2.6*  --   --  2.2  < > = values in this interval not displayed. CBC:  Recent Labs  05/19/2016 1219 05/16/16 0207  WBC 6.6 12.2*  HGB 11.1* 11.4*  HCT 33.4* 33.1*  MCV 95.2 92.5  PLT 119* 114*   Cardiac Enzymes:  Recent Labs  05/16/16 0207  TROPONINI 0.09*   Thyroid Function Tests:  Recent Labs  05/27/2016 1220  TSH 7.627*    RADIOLOGY: Dg Chest 2 View 05/15/2016  CLINICAL DATA:  Pt has h/o recurrent right pleural effusion, and chronic atrial fibrillation who presents after an episode of syncope. Pt has been very SOB, increasingly this morning. Pt has been instructed to sit as upright as possible, which he states has been helping minimally with his SOB. EXAM: CHEST  2 VIEW COMPARISON:  05/02/2016 FINDINGS: There  is new airspace opacity developing in the left upper lobe. There is persistent opacity in both mid to lower lungs and focal pleural based opacity in the right upper lobe. The latter finding is likely loculated pleural fluid. Bilateral pleural effusions, moderate size.  No pneumothorax. Catheter that projects over the right mid to lower lung with its tip projecting anterior to the thoracic spine is stable. IMPRESSION: 1. Worsened lung aeration since the previous exam with new airspace opacity developing in the left upper lobe. 2. No other change. Electronically Signed   By: Lajean Manes M.D.   On: 05/15/2016 10:40   ASSESSMENT AND PLAN:  Principal Problem:   Acute on chronic systolic CHF (congestive heart failure) (HCC) Active Problems:   Type II diabetes mellitus with manifestations (HCC)   Paroxysmal VT-proarrhythmia 2/2 tikosyn   Hypothyroid   Chronic systolic CHF (congestive heart failure) (HCC)   Electrolyte disturbance   AKI (acute kidney injury) ([place])   Syncope   CKD (chronic kidney disease) stage 3, GFR 30-59 ml/min   Palliative care  encounter  1.  Syncope Unclear etiology Continue to monitor for now, but with advanced age and end stage HF, not a candidate for EP procedures at this time  2.  Acute on chronic combined systolic and diastolic heart failure Shortness of breath worse this morning Extra IV Lasix given this morning Pt is DNI per discussion with palliative care yesterday  3.  Acute on chronic renal failure Likely cardiorenal  He is currently on Eliquis as part of clinical trial, will need to discuss with Dr Purnell Shoemaker - will call today  4.  Persistent atrial fibrillation Maintaining SR on amiodarone Continue for now CHADS2VASC is 6, on Eliquis as part of the Apixaban Validation Study for now, will discuss with pulmonary as above.   5.  Code status Being followed by palliative care He is DNI but wishes for shocks and CPR if needed  Dr Caryl Comes to see this morning  Chanetta Marshall, NP 05/16/2016 8:22 AM    Pt seen and examined endstage respiratoryfailure from combination of CHF assoc w renal dysfunction and aspiration pneumonia He has been intolerant of bi0pap, and he reiterates his desire not to be intubated Have triedto call his wife but got no answer Suspect he is close to dying  Will hold off on opiates and maybe he can be transferred to home for last days ( hours)

## 2016-05-16 NOTE — Progress Notes (Signed)
Nutrition Brief Note  Patient identified on the Malnutrition Screening Tool (MST) Report  Wt Readings from Last 15 Encounters:  05/16/16 171 lb 8 oz (77.792 kg)  05/10/16 167 lb (75.751 kg)  04/26/16 160 lb 6.4 oz (72.757 kg)  04/08/16 169 lb (76.658 kg)  04/05/16 164 lb 1.6 oz (74.435 kg)  03/30/16 159 lb (72.122 kg)  03/07/16 170 lb (77.111 kg)  02/15/16 170 lb (77.111 kg)  01/21/16 171 lb (77.565 kg)  01/20/16 162 lb 3.2 oz (73.573 kg)  01/07/16 160 lb 1.6 oz (72.621 kg)  01/05/16 179 lb (81.194 kg)  12/30/15 169 lb (76.658 kg)  12/25/15 159 lb (72.122 kg)  12/23/15 163 lb (73.936 kg)    Body mass index is 21.44 kg/(m^2). Patient meets criteria for Normal Weight based on current BMI.   Current diet order is Clear Liquids. Per chart, to is being transitioned to comfort care with hospital death anticipated.    No nutrition interventions warranted at this time. If nutrition issues arise, please consult RD.   Scarlette Ar RD, LDN Inpatient Clinical Dietitian Pager: [phone] After Hours Pager: [phone]

## 2016-05-16 NOTE — Patient Outreach (Signed)
Noted that member was admitted to hospital over the weekend.  Notified hospital liaisons of hospital admission and potential involvement with hospice upon discharge.  Call then received from member's wife, stating that he is now in the hospital and it is "serious."  She confirms that when he is discharged, he will be going home with hospice and will no longer need [facility] involvement.    Will notify care management assistant and PCP of case closure.  [name], [place], MSN [place] [phone]

## 2016-05-16 NOTE — Progress Notes (Signed)
Plan for transition to full comfort care noted.  Electrophysiology team to see as needed while here. Please call with questions.  Chanetta Marshall, NP 05/16/2016 1:25 PM

## 2016-05-16 NOTE — Progress Notes (Signed)
[facility] progress note. Chief complaint. Chest pain. History of present illness. This 82 year old male with chronic congestive heart failure and recurrent right-sided pleural effusion admitted with a syncopal event. The patient came to the emergency room and was diagnosed with A. fib with RVR. Cardiology was consulted and indicated he felt patient's long-term prognosis was very poor. Cardiology called and palliative care. Per attending there was a discussion with the patient's wife who agreed with DO NOT RESUSCITATE status and if further decline mental Comfort Care. Discharge plan would be home with hospice. Patient complained of chest pain to nursing and a 12-lead EKG was obtained. The EKG is of poor quality but does not look significantly different to me than prior EKG this hospitalization. I came to see the patient at bedside and he does indicate he is experiencing right chest pain to central chest pain with radiation to the upper abdomen. There is no radiation to the left arm or jaw. No diaphoresis or associated nausea. Physical exam. Vital signs. Temperature 97.7, pulse 65, respiration 20, blood pressure 101/41. O2 sats 91% on facemask oxygen. General appearance. Frail elderly male who is alert and in no distress. Cardiac. Rate and rhythm regular. Lungs. Breath sounds reduced but clear. Abdomen. Soft with positive bowel sounds. Impression/plan. Problem #1. Chest pain. Patient does have a significant history of coronary artery disease and systolic congestive heart failure. Unclear to me if current pain is cardiac in nature or pulmonary. My suspicion however is that the pain source is pulmonary. Nonetheless, we'll obtain troponin now and then every 6 hours for total of 3 sets. We'll repeat an EKG in approximately 6 hours to evaluate for any changes.

## 2016-05-16 NOTE — Discharge Instructions (Signed)

## 2016-05-16 NOTE — Care Management Note (Addendum)
Case Management Note  Patient Details  Name: MACEO BERNHAGEN MRN: [id number] Date of Birth: 12-02-1924  Subjective/Objective:     Pt admitted with acute on chronic systolic CHF               Action/Plan:   Pt transitioned to full comfort care  Pt is from home with wife.  CM received consult for home with hospice - however per bedside nurse pt is actively dying.  Pt is non re-reather with saturations in the 80's.  CM spoke in depth with Palliative Care NP and informed of concerns with pt transporting home.  NP acknowledged concerns and agreed that recent events overnight will change disposition plan.  NP to assess pt for residential hospice and hospital death,  CSW consulted for tentative referral.  CM will continue to monitor for disposition plan   Expected Discharge Date:                  Expected Discharge Plan:  [place]  In-House Referral:  Clinical Social Work  Discharge planning Services  CM Consult  Post Acute Care Choice:    Choice offered to:     DME Arranged:    DME Agency:     HH Arranged:    [place] Agency:     Status of Service:  In process, will continue to follow  Medicare Important Message Given:    Date Medicare IM Given:    Medicare IM give by:    Date Additional Medicare IM Given:    Additional Medicare Important Message give by:     If discussed at [place] of Stay Meetings, dates discussed:    Additional Comments:  Maryclare Labrador, RN 05/16/2016, 9:47 AM

## 2016-05-16 NOTE — Progress Notes (Addendum)
PROGRESS NOTE                                                                                                                                                                                                             Patient Demographics:    [name], is a 82 y.o. male, DOB - 1925-06-19, [id number]  Admit date - 05/07/2016   Admitting Physician Maren Reamer, MD  Outpatient Primary MD for the patient is [name], MD  LOS - 2  Chief Complaint  Patient presents with  . Loss of Consciousness       Brief Narrative    [name] is a 82 y.o. male with medical history significant for chronic systolic heart failure, recurrent right-sided pleural effusion, CK D5 baseline creatinine close to 2.5, diabetes, ventricular arrhythmias, and thyroid disease. Admitted for a syncopal episode. Over the last few days patient has become increasingly weak and correlates this time wise with change in diuretics. Metolazone was discontinued, Aldactone started a few days ago. Patient gained 1.5 pounds yesterday. He notes decreased urinary output last night from his typical 200 cc to about 30 cc. No change in baseline shortness of breath. In the ER he was diagnosed with A. fib with RVR. Cardiology was consulted, he also had evidence of decompensated CHF and ARF on CK D.  Note patient has had multiple recent admissions for CHF, he's been seen by cardiology this admission were of the opinion that patient's long-term prognosis is extremely poor. Palliative care has been involved. For now he is partial code. I discussed his prognosis with patient's wife on 05/15/2016 and explained to her clearly that he is not doing well and could pass away this admission. She understands it. If he declines further she is open to hospice.    Subjective:    [name] today Appears more tired and fatigued, says he is short of breath, no chest pain,  denies any productive cough, no abdominal pain or focal weakness.   Assessment  & Plan :     1.Syncope per cardiology suspicious for V. tach. Does have history of A. fib. Also noted to be baseline hypotensive, currently on combination of amiodarone, digoxin and beta blocker which will be continued. Monitor on telemetry. Further workup per cardiology.  2. Acute hypoxic respiratory failure due to Acute on  chronic systolic heart failure EF 25-30%. Continue IV Lasix as tolerated by his blood pressure, continue low-dose beta blocker and Aldactone, no ACE/ARB due to renal failure, also blood low for adding other medications. Continue daily weights, intake and output monitoring.  CXontinue oxygen supplementation, poor BiPAP candidate due to high risk for aspiration, does not desire intubation.   Seen by Cards, long term prognosis very poor , Cards has called Palliative care as well. On 05/15/2016 I talked with his wife, explained poor outcome likely, agrees with Med Rx, patient is a poor BIPAP candidate as high risk for aspiration, No intubation, Med Rx, if declines further full comfort care,on 05/16/2016 despite appropriate treatment he continues to decline, discussed his care with his son-in-law and he in detail, now DO NOT RESUSCITATE, continue Lasix and medications, if no improvement soon morphine drip and full comfort measures. DO NOT RESUSCITATE.  3. Chronic atrial fibrillation. [place] vasc 2 score of at least 5. On combination of amiodarone, digoxin and low-dose beta blocker along with Eliquis.  4. Recurrent right>left-sided pleural effusion. Worsened by #2 above, outpatient follow by pulmonary. For now diuresis and supportive care.  5. Hyponatremia along with ARF on CK D stage V. Baseline creatinine close to 2.5, this is due to CHF decompensation, diurese and monitor.  6. Hypothyroidism. On Synthroid.  7. BPH. On Flomax.   8. L sided infiltrate on CXR - Likely assymetric edema, could now be  aspirating, as appears increasingly fatigued and tired, on Rocephin and doxycycline continue for now.  9. DM type II. On Levemir along with sliding scale  Lab Results  Component Value Date   HGBA1C 6.1 05/10/2016   CBG (last 3)   Recent Labs  05/24/2016 2056 05/20/2016 2354 05/15/16 1124  GLUCAP 139* 179* 180*      Code Status :   DNR  Family Communication  :   Discussed with wife in detail on 05/15/2016, daughter's phone disconnected.  Discussed with son in law [place] on 05/16/2016  Disposition Plan  :  Stay on Tele  Consults  :  Cards, Pall care  Procedures  :    CT head nonacute  Echogram December 2016 with EF of 25-30%. Diffuse hypokinesis.  DVT Prophylaxis  :  Eliquis  Lab Results  Component Value Date   PLT 114* 05/16/2016    Inpatient Medications  Scheduled Meds: . amiodarone  200 mg Oral BID  . apixaban  2.5 mg Oral BID  . azelastine  2 spray Each Nare BID  . cefTRIAXone (ROCEPHIN)  IV  1 g Intravenous Q24H  . ciprofloxacin  2 drop Both Eyes BID  . doxycycline  100 mg Oral Q12H  . finasteride  5 mg Oral Daily  . furosemide  40 mg Intravenous BID  . levothyroxine  75 mcg Oral QAC breakfast  . lipase/protease/amylase  12,000 Units Oral TID AC  . metoprolol tartrate  25 mg Oral BID  . sodium chloride flush  3 mL Intravenous Q12H  . tamsulosin  0.4 mg Oral BID   Continuous Infusions:  PRN Meds:.albuterol, polyethylene glycol, sodium chloride, traMADol  Antibiotics  :    Anti-infectives    Start     Dose/Rate Route Frequency Ordered Stop   05/16/16 1000  azithromycin (ZITHROMAX) tablet 250 mg  Status:  Discontinued     250 mg Oral Daily 05/15/16 1246 05/15/16 1251   05/15/16 1300  azithromycin (ZITHROMAX) tablet 500 mg  Status:  Discontinued     500 mg Oral Daily  05/15/16 1246 05/15/16 1251   05/15/16 1300  doxycycline (VIBRA-TABS) tablet 100 mg     100 mg Oral Every 12 hours 05/15/16 1256     05/15/16 1300  cefTRIAXone (ROCEPHIN) 1 g in dextrose  5 % 50 mL IVPB     1 g 100 mL/hr over 30 Minutes Intravenous Every 24 hours 05/15/16 1256     05/15/16 1000  doxycycline (VIBRA-TABS) tablet 100 mg  Status:  Discontinued     100 mg Oral Every 12 hours 05/15/16 0913 05/15/16 0922   05/16/2016 1400  vancomycin (VANCOCIN) IVPB 1000 mg/200 mL premix     1,000 mg 200 mL/hr over 60 Minutes Intravenous  Once 05/25/2016 1339 05/15/2016 1651   05/12/2016 1345  ceFEPIme (MAXIPIME) 1 g in dextrose 5 % 50 mL IVPB     1 g 100 mL/hr over 30 Minutes Intravenous STAT 05/27/2016 1339 05/19/2016 1522   05/22/2016 1345  vancomycin (VANCOCIN) 500 mg in sodium chloride 0.9 % 100 mL IVPB     500 mg 100 mL/hr over 60 Minutes Intravenous  Once 05/05/2016 1343 05/07/2016 1651         Objective:   Filed Vitals:   05/16/16 0131 05/16/16 0442 05/16/16 0745 05/16/16 0836  BP: 101/41 85/42 93/35  94/38  Pulse: 65 64  69  Temp: 97.7 F (36.5 C) 97.5 F (36.4 C)    TempSrc: Oral Oral    Resp: 20 15  18   Height:      Weight:  77.792 kg (171 lb 8 oz)    SpO2: 91% 91% 90% 91%    Wt Readings from Last 3 Encounters:  05/16/16 77.792 kg (171 lb 8 oz)  05/10/16 75.751 kg (167 lb)  04/26/16 72.757 kg (160 lb 6.4 oz)     Intake/Output Summary (Last 24 hours) at 05/16/16 0940 Last data filed at 05/16/16 0836  Gross per 24 hour  Intake    243 ml  Output    675 ml  Net   -432 ml     Physical Exam  Awake Alert, Oriented X 3, No new F.N deficits, Normal affect [place].AT,PERRAL Supple Neck,No JVD, No cervical lymphadenopathy appriciated.  Symmetrical Chest wall movement, Good air movement bilaterally, CTAB RRR,No Gallops,Rubs or new Murmurs, No Parasternal Heave +ve B.Sounds, Abd Soft, No tenderness, No organomegaly appriciated, No rebound - guarding or rigidity. No Cyanosis, Clubbing or edema, No new Rash or bruise       Data Review:    CBC  Recent Labs Lab 05/10/16 1038 05/15/2016 1219 05/16/16 0207  WBC 5.9 6.6 12.2*  HGB 11.3* 11.1* 11.4*  HCT 33.2* 33.4*  33.1*  PLT 114.0* 119* 114*  MCV 96.0 95.2 92.5  MCH  --  31.6 31.8  MCHC 34.0 33.2 34.4  RDW 16.9* 16.6* 16.9*  LYMPHSABS 0.7  --   --   MONOABS 1.0  --   --   EOSABS 0.0  --   --   BASOSABS 0.0  --   --     Chemistries   Recent Labs Lab 05/10/16 1038 05/25/2016 1200 05/11/2016 1219 05/15/16 0623 05/16/16 0207  NA 129*  --  125* 128* 129*  K 2.8 Repeated and verified X2.*  --  4.0 3.6 3.7  CL 83*  --  83* 85* 89*  CO2 38*  --  31 30 27   GLUCOSE 121*  --  124* 161* 193*  BUN 96 Repeated and verified X2.*  --  103* 97* 96*  CREATININE 3.21*  --  3.99* 3.70* 3.31*  CALCIUM 8.2*  --  8.1* 8.1* 8.3*  MG  --  2.6*  --   --  2.2   ------------------------------------------------------------------------------------------------------------------ No results for input(s): CHOL, HDL, LDLCALC, TRIG, CHOLHDL, LDLDIRECT in the last 72 hours.  Lab Results  Component Value Date   HGBA1C 6.1 05/10/2016   ------------------------------------------------------------------------------------------------------------------  Recent Labs  05/16/2016 1220  TSH 7.627*   ------------------------------------------------------------------------------------------------------------------ No results for input(s): VITAMINB12, FOLATE, FERRITIN, TIBC, IRON, RETICCTPCT in the last 72 hours.  Coagulation profile  Recent Labs Lab 05/02/2016 1200  INR 1.62*    No results for input(s): DDIMER in the last 72 hours.  Cardiac Enzymes  Recent Labs Lab 05/16/16 0207  TROPONINI 0.09*   ------------------------------------------------------------------------------------------------------------------    Component Value Date/Time   BNP 535.2* 05/08/2016 1219    Micro Results Recent Results (from the past 240 hour(s))  Blood culture (routine x 2)     Status: None (Preliminary result)   Collection Time: 05/06/2016  1:45 PM  Result Value Ref Range Status   Specimen Description BLOOD LEFT HAND  Final    Special Requests BOTTLES DRAWN AEROBIC AND ANAEROBIC 5CC  Final   Culture NO GROWTH 1 DAY  Final   Report Status PENDING  Incomplete  Blood culture (routine x 2)     Status: None (Preliminary result)   Collection Time: 05/05/2016  1:51 PM  Result Value Ref Range Status   Specimen Description BLOOD RIGHT ANTECUBITAL  Final   Special Requests BOTTLES DRAWN AEROBIC AND ANAEROBIC 5CC  Final   Culture NO GROWTH 1 DAY  Final   Report Status PENDING  Incomplete    Radiology Reports Dg Chest 2 View  05/15/2016  CLINICAL DATA:  Pt has h/o recurrent right pleural effusion, and chronic atrial fibrillation who presents after an episode of syncope. Pt has been very SOB, increasingly this morning. Pt has been instructed to sit as upright as possible, which he states has been helping minimally with his SOB. EXAM: CHEST  2 VIEW COMPARISON:  05/04/2016 FINDINGS: There is new airspace opacity developing in the left upper lobe. There is persistent opacity in both mid to lower lungs and focal pleural based opacity in the right upper lobe. The latter finding is likely loculated pleural fluid. Bilateral pleural effusions, moderate size.  No pneumothorax. Catheter that projects over the right mid to lower lung with its tip projecting anterior to the thoracic spine is stable. IMPRESSION: 1. Worsened lung aeration since the previous exam with new airspace opacity developing in the left upper lobe. 2. No other change. Electronically Signed   By: Lajean Manes M.D.   On: 05/15/2016 10:40   Ct Head Wo Contrast  05/06/2016  CLINICAL DATA:  Witnessed syncopal episode and fall this morning. EXAM: CT HEAD WITHOUT CONTRAST TECHNIQUE: Contiguous axial images were obtained from the base of the skull through the vertex without intravenous contrast. COMPARISON:  Head CT 07/06/2013 FINDINGS: Brain: No evidence of acute infarction, hemorrhage, extra-axial collection, ventriculomegaly, or mass effect. There is moderate brain parenchymal  atrophy and mild deep white matter microvascular ischemia. Vascular: No hyperdense vessel or unexpected calcification. Skull: Negative for fracture or focal lesion. Sinuses/Orbits: No acute findings. Other: None. IMPRESSION: No acute intracranial abnormality. Atrophy, chronic microvascular disease. Electronically Signed   By: Fidela Salisbury M.D.   On: 05/01/2016 17:39   Dg Chest Portable 1 View  05/11/2016  CLINICAL DATA:  Hypotension. EXAM: PORTABLE CHEST 1 VIEW COMPARISON:  Radiograph of Apr 01, 2016. FINDINGS:  Stable cardiomegaly. No pneumothorax is noted. Mild loculated pleural effusion is seen over the right upper lobe. Increased bibasilar interstitial densities are noted concerning for possible edema. No significant pleural effusion is noted. Bony thorax is unremarkable. IMPRESSION: Mild loculated right pleural effusion is seen over the right upper lobe. Increased bibasilar interstitial densities are noted concerning for possible edema. Electronically Signed   By: Marijo Conception, M.D.   On: 05/08/2016 14:13    Time Spent in minutes  30   SINGH,PRASHANT K M.D on 05/16/2016 at 9:40 AM  Between 7am to 7pm - Pager - [phone]  After 7pm go to [email or web address] - password [facility]  [facility] -  Office  [phone]

## 2016-05-16 NOTE — Consult Note (Signed)
   [facility] Inpatient Consult   05/16/2016  [name] 08-06-1925 [id number]   Patient was previously active with [place] Management services prior to admission from [facility] facility.  Per chart review and in progression meeting.  The patient is now being followed by Hospice. Given this choice this patient  will have full case management services through Hospice and we will close her case for community case management program.  Please contact for questions or changes in disposition as stated.  For questions, please contact:  Natividad Brood, RN BSN [facility] Liaison  [phone] business mobile phone Toll free office [phone]

## 2016-05-16 NOTE — Care Management Important Message (Signed)
Important Message  Patient Details  Name: [name] MRN: [id number] Date of Birth: 11-02-1925   Medicare Important Message Given:  Yes    Loann Quill 05/16/2016, 10:11 AM

## 2016-05-16 NOTE — Progress Notes (Signed)
Palliative Medicine RN: Pt had significant decline in respiratory status overnight. I arrived to see pt after Dr Candiss Norse had spoken to family. Goal now is strictly comfort, DNR. Wife and son in law both state that they understand death is expected in the next few days (they report this information as being given to them during discussion w Dr Candiss Norse).   Discussed with PMT MD Dr. Earnest Conroy. Anwar. Pt is SOB on NRB. Ordered fan for pt. Dr Rowe Pavy ordered medication and treatment adjustments, as pt lost IV while I was on floor (RN attempted x2 with no success; pt on Eliquis and oozing from sites). Changes discussed with Dr Candiss Norse; he requested pt be moved to [facility] for comfort care. Hospital death is anticipated.  Of note, pt is on Eliquis trial with Dr Chase Caller. At order of Drs Rowe Pavy and Candiss Norse, Eliquis was discontinued. Left a message for Dr Chase Caller with update.  Funeral [facility] will be [facility] in [place], [place]. Please call them at [phone] at time of pt's death, and they will arrange pick up by local FH.  Marjie Skiff Raynard Mapps, RN, BSN, [facility] 05/16/2016 11:11 AM Cell [phone] 8:00-4:00 Monday-Friday Office [phone]

## 2016-05-18 ENCOUNTER — Ambulatory Visit: Payer: Medicare Other | Admitting: Internal Medicine

## 2016-05-19 LAB — CULTURE, BLOOD (ROUTINE X 2)
CULTURE: NO GROWTH
Culture: NO GROWTH

## 2016-05-28 ENCOUNTER — Other Ambulatory Visit: Payer: Self-pay | Admitting: Internal Medicine

## 2016-05-28 NOTE — Progress Notes (Signed)
Called by daugther to [facility] on pt.Pt noted to have no respirations BP 0/0.pronounced expiration with Jakema rn. Notified Juliann Pulse schoor regarding expiration.notified crystal AC. daugther at bedside.

## 2016-05-28 NOTE — Discharge Summary (Signed)
[facility] Death Note                                                                                                                                                                                                                                                                 Death Note please see Last Note for all details.   In breif -    [name] [id number] is a 82 y.o. male, Outpatient Primary MD for the patient is [name], MD  Pronounced dead by  RN on 05/26/2016 @  6.41 am                 Cause of death CHF was on full comfort care and DNR  Signature  Thurnell Lose M.D on 2016-05-26 at 7:21 AM  [facility]  Office Phone [phone]  Total clinical and documentation time for today Under 30 minutes   Last Note                                                                                                                                                                                  PROGRESS NOTE  Patient Demographics:    [name], is a 82 y.o. male, DOB - November 11, 1925, [id number]  Admit date - 05/23/2016   Admitting Physician Maren Reamer, MD  Outpatient Primary MD for the patient is [name], MD  LOS - 3  Chief Complaint  Patient presents with  . Loss of Consciousness       Brief Narrative    [name] is a 82 y.o. male with medical history significant for chronic systolic heart failure, recurrent right-sided pleural effusion, CK D5 baseline creatinine close to 2.5, diabetes, ventricular arrhythmias, and thyroid disease. Admitted for a syncopal episode. Over the last few days patient has become increasingly weak and  correlates this time wise with change in diuretics. Metolazone was discontinued, Aldactone started a few days ago. Patient gained 1.5 pounds yesterday. He notes decreased urinary output last night from his typical 200 cc to about 30 cc. No change in baseline shortness of breath. In the ER he was diagnosed with A. fib with RVR. Cardiology was consulted, he also had evidence of decompensated CHF and ARF on CK D.  Note patient has had multiple recent admissions for CHF, he's been seen by cardiology this admission were of the opinion that patient's long-term prognosis is extremely poor. Palliative care has been involved. For now he is partial code. I discussed his prognosis with patient's wife on 05/15/2016 and explained to her clearly that he is not doing well and could pass away this admission. She understands it. If he declines further she is open to hospice.    Subjective:    [name] today Appears more tired and fatigued, says he is short of breath, no chest pain, denies any productive cough, no abdominal pain or focal weakness.   Assessment  & Plan :     1.Syncope per cardiology suspicious for V. tach. Does have history of A. fib. Also noted to be baseline hypotensive, currently on combination of amiodarone, digoxin and beta blocker which will be continued. Monitor on telemetry. Further workup per cardiology.  2. Acute hypoxic respiratory failure due to Acute on chronic systolic heart failure EF 25-30%. Continue IV Lasix as tolerated by his blood pressure, continue low-dose beta blocker and Aldactone, no ACE/ARB due to renal failure, also blood low for adding other medications. Continue daily weights, intake and output monitoring.  CXontinue oxygen supplementation, poor BiPAP candidate due to high risk for aspiration, does not desire intubation.   Seen by Cards, long term prognosis very poor , Cards has called Palliative care as well. On 05/15/2016 I talked with his wife, explained poor  outcome likely, agrees with Med Rx, patient is a poor BIPAP candidate as high risk for aspiration, No intubation, Med Rx, if declines further full comfort care,on 05/16/2016 despite appropriate treatment he continues to decline, discussed his care with his son-in-law and he in detail, now DO NOT RESUSCITATE, continue Lasix and medications, if no improvement soon morphine drip and full comfort measures. DO NOT RESUSCITATE.  3. Chronic atrial fibrillation. [place] vasc 2 score of at least 5. On combination of amiodarone, digoxin and low-dose beta blocker along with Eliquis.  4. Recurrent right>left-sided pleural effusion. Worsened by #2 above, outpatient follow by pulmonary. For now diuresis and supportive care.  5. Hyponatremia along with ARF on CK D stage V. Baseline creatinine close to 2.5, this is due to CHF decompensation, diurese and monitor.  6. Hypothyroidism. On Synthroid.  7. BPH. On Flomax.   8. L sided infiltrate on CXR -  Likely assymetric edema, could now be aspirating, as appears increasingly fatigued and tired, on Rocephin and doxycycline continue for now.  9. DM type II. On Levemir along with sliding scale  Lab Results  Component Value Date   HGBA1C 6.1 05/10/2016   CBG (last 3)   Recent Labs  05/13/2016 2056 05/10/2016 2354 05/15/16 1124  GLUCAP 139* 179* 180*      Code Status :   DNR  Family Communication  :   Discussed with wife in detail on 05/15/2016, daughter's phone disconnected.  Discussed with son in law [place] on 05/16/2016  Disposition Plan  :  Stay on Tele  Consults  :  Cards, Pall care  Procedures  :    CT head nonacute  Echogram December 2016 with EF of 25-30%. Diffuse hypokinesis.  DVT Prophylaxis  :  Eliquis  Lab Results  Component Value Date   PLT 114* 05/16/2016    Inpatient Medications  Scheduled Meds: . azelastine  2 spray Each Nare BID  . ciprofloxacin  2 drop Both Eyes BID  . furosemide  40 mg Oral BID  . oxyCODONE  5 mg Oral  Q6H  . tamsulosin  0.4 mg Oral BID   Continuous Infusions:  PRN Meds:.acetaminophen **OR** acetaminophen, albuterol, antiseptic oral rinse, glycopyrrolate **OR** glycopyrrolate **OR** glycopyrrolate, LORazepam, ondansetron **OR** ondansetron (ZOFRAN) IV, oxyCODONE, polyvinyl alcohol, sodium chloride  Antibiotics  :    Anti-infectives    Start     Dose/Rate Route Frequency Ordered Stop   05/16/16 1000  azithromycin (ZITHROMAX) tablet 250 mg  Status:  Discontinued     250 mg Oral Daily 05/15/16 1246 05/15/16 1251   05/15/16 1300  azithromycin (ZITHROMAX) tablet 500 mg  Status:  Discontinued     500 mg Oral Daily 05/15/16 1246 05/15/16 1251   05/15/16 1300  doxycycline (VIBRA-TABS) tablet 100 mg  Status:  Discontinued     100 mg Oral Every 12 hours 05/15/16 1256 05/16/16 1100   05/15/16 1300  cefTRIAXone (ROCEPHIN) 1 g in dextrose 5 % 50 mL IVPB  Status:  Discontinued     1 g 100 mL/hr over 30 Minutes Intravenous Every 24 hours 05/15/16 1256 05/16/16 1100   05/15/16 1000  doxycycline (VIBRA-TABS) tablet 100 mg  Status:  Discontinued     100 mg Oral Every 12 hours 05/15/16 0913 05/15/16 0922   05/08/2016 1400  vancomycin (VANCOCIN) IVPB 1000 mg/200 mL premix     1,000 mg 200 mL/hr over 60 Minutes Intravenous  Once 05/12/2016 1339 05/11/2016 1651   05/13/2016 1345  ceFEPIme (MAXIPIME) 1 g in dextrose 5 % 50 mL IVPB     1 g 100 mL/hr over 30 Minutes Intravenous STAT 05/07/2016 1339 05/25/2016 1522   05/21/2016 1345  vancomycin (VANCOCIN) 500 mg in sodium chloride 0.9 % 100 mL IVPB     500 mg 100 mL/hr over 60 Minutes Intravenous  Once 05/18/2016 1343 05/16/2016 1651         Objective:   Filed Vitals:   05/16/16 0131 05/16/16 0442 05/16/16 0745 05/16/16 0836  BP: 101/41 85/42 93/35  94/38  Pulse: 65 64  69  Temp: 97.7 F (36.5 C) 97.5 F (36.4 C)    TempSrc: Oral Oral    Resp: 20 15  18   Height:      Weight:  77.792 kg (171 lb 8 oz)    SpO2: 91% 91% 90% 91%    Wt Readings from Last 3  Encounters:  05/16/16 77.792 kg (171 lb  8 oz)  05/10/16 75.751 kg (167 lb)  04/26/16 72.757 kg (160 lb 6.4 oz)     Intake/Output Summary (Last 24 hours) at Jun 07, 2016 0721 Last data filed at 06/07/16 0406  Gross per 24 hour  Intake      0 ml  Output    725 ml  Net   -725 ml     Physical Exam  Awake Alert, Oriented X 3, No new F.N deficits, Normal affect [place].AT,PERRAL Supple Neck,No JVD, No cervical lymphadenopathy appriciated.  Symmetrical Chest wall movement, Good air movement bilaterally, CTAB RRR,No Gallops,Rubs or new Murmurs, No Parasternal Heave +ve B.Sounds, Abd Soft, No tenderness, No organomegaly appriciated, No rebound - guarding or rigidity. No Cyanosis, Clubbing or edema, No new Rash or bruise       Data Review:    CBC  Recent Labs Lab 05/10/16 1038 05/20/2016 1219 05/16/16 0207  WBC 5.9 6.6 12.2*  HGB 11.3* 11.1* 11.4*  HCT 33.2* 33.4* 33.1*  PLT 114.0* 119* 114*  MCV 96.0 95.2 92.5  MCH  --  31.6 31.8  MCHC 34.0 33.2 34.4  RDW 16.9* 16.6* 16.9*  LYMPHSABS 0.7  --   --   MONOABS 1.0  --   --   EOSABS 0.0  --   --   BASOSABS 0.0  --   --     Chemistries   Recent Labs Lab 05/10/16 1038 05/04/2016 1200 05/16/2016 1219 05/15/16 0623 05/16/16 0207  NA 129*  --  125* 128* 129*  K 2.8 Repeated and verified X2.*  --  4.0 3.6 3.7  CL 83*  --  83* 85* 89*  CO2 38*  --  31 30 27   GLUCOSE 121*  --  124* 161* 193*  BUN 96 Repeated and verified X2.*  --  103* 97* 96*  CREATININE 3.21*  --  3.99* 3.70* 3.31*  CALCIUM 8.2*  --  8.1* 8.1* 8.3*  MG  --  2.6*  --   --  2.2   ------------------------------------------------------------------------------------------------------------------ No results for input(s): CHOL, HDL, LDLCALC, TRIG, CHOLHDL, LDLDIRECT in the last 72 hours.  Lab Results  Component Value Date   HGBA1C 6.1 05/10/2016    ------------------------------------------------------------------------------------------------------------------  Recent Labs  05/04/2016 1220  TSH 7.627*   ------------------------------------------------------------------------------------------------------------------ No results for input(s): VITAMINB12, FOLATE, FERRITIN, TIBC, IRON, RETICCTPCT in the last 72 hours.  Coagulation profile  Recent Labs Lab 05/25/2016 1200  INR 1.62*    No results for input(s): DDIMER in the last 72 hours.  Cardiac Enzymes  Recent Labs Lab 05/16/16 0207 05/16/16 0815  TROPONINI 0.09* 0.08*   ------------------------------------------------------------------------------------------------------------------    Component Value Date/Time   BNP 535.2* 05/02/2016 1219    Micro Results Recent Results (from the past 240 hour(s))  Blood culture (routine x 2)     Status: None (Preliminary result)   Collection Time: 05/04/2016  1:45 PM  Result Value Ref Range Status   Specimen Description BLOOD LEFT HAND  Final   Special Requests BOTTLES DRAWN AEROBIC AND ANAEROBIC 5CC  Final   Culture NO GROWTH 2 DAYS  Final   Report Status PENDING  Incomplete  Blood culture (routine x 2)     Status: None (Preliminary result)   Collection Time: 05/03/2016  1:51 PM  Result Value Ref Range Status   Specimen Description BLOOD RIGHT ANTECUBITAL  Final   Special Requests BOTTLES DRAWN AEROBIC AND ANAEROBIC 5CC  Final   Culture NO GROWTH 2 DAYS  Final   Report Status PENDING  Incomplete  Radiology Reports Dg Chest 2 View  05/15/2016  CLINICAL DATA:  Pt has h/o recurrent right pleural effusion, and chronic atrial fibrillation who presents after an episode of syncope. Pt has been very SOB, increasingly this morning. Pt has been instructed to sit as upright as possible, which he states has been helping minimally with his SOB. EXAM: CHEST  2 VIEW COMPARISON:  05/24/2016 FINDINGS: There is new airspace opacity  developing in the left upper lobe. There is persistent opacity in both mid to lower lungs and focal pleural based opacity in the right upper lobe. The latter finding is likely loculated pleural fluid. Bilateral pleural effusions, moderate size.  No pneumothorax. Catheter that projects over the right mid to lower lung with its tip projecting anterior to the thoracic spine is stable. IMPRESSION: 1. Worsened lung aeration since the previous exam with new airspace opacity developing in the left upper lobe. 2. No other change. Electronically Signed   By: Lajean Manes M.D.   On: 05/15/2016 10:40   Ct Head Wo Contrast  05/09/2016  CLINICAL DATA:  Witnessed syncopal episode and fall this morning. EXAM: CT HEAD WITHOUT CONTRAST TECHNIQUE: Contiguous axial images were obtained from the base of the skull through the vertex without intravenous contrast. COMPARISON:  Head CT 07/06/2013 FINDINGS: Brain: No evidence of acute infarction, hemorrhage, extra-axial collection, ventriculomegaly, or mass effect. There is moderate brain parenchymal atrophy and mild deep white matter microvascular ischemia. Vascular: No hyperdense vessel or unexpected calcification. Skull: Negative for fracture or focal lesion. Sinuses/Orbits: No acute findings. Other: None. IMPRESSION: No acute intracranial abnormality. Atrophy, chronic microvascular disease. Electronically Signed   By: Fidela Salisbury M.D.   On: 05/15/2016 17:39   Dg Chest Portable 1 View  05/18/2016  CLINICAL DATA:  Hypotension. EXAM: PORTABLE CHEST 1 VIEW COMPARISON:  Radiograph of Apr 01, 2016. FINDINGS: Stable cardiomegaly. No pneumothorax is noted. Mild loculated pleural effusion is seen over the right upper lobe. Increased bibasilar interstitial densities are noted concerning for possible edema. No significant pleural effusion is noted. Bony thorax is unremarkable. IMPRESSION: Mild loculated right pleural effusion is seen over the right upper lobe. Increased bibasilar  interstitial densities are noted concerning for possible edema. Electronically Signed   By: Marijo Conception, M.D.   On: 05/03/2016 14:13    Time Spent in minutes  30   Miki Blank K M.D on 2016/05/22 at 7:21 AM  Between 7am to 7pm - Pager - [phone]  After 7pm go to [email or web address] - password [email or web address]  [facility] -  Office  [phone]

## 2016-05-28 NOTE — Progress Notes (Signed)
Palliative Medicine RN Note: Rec'd call at [phone] from family stating that they called [facility] and that [place] told them no one from [facility] had called them yet. Reviewing notes, the TOD was 0641. Spoke with [place] on 3E; they have called the FH, but it will be about 4 hours before they get here. Called son in law Verne Spurr [phone] and left message w update.  Marjie Skiff Abie Killian, RN, BSN, [facility] 2016-06-13 8:00 AM Cell [phone] 8:00-4:00 Monday-Friday Office [phone]

## 2016-05-28 DEATH — deceased

## 2016-06-02 ENCOUNTER — Other Ambulatory Visit: Payer: Self-pay | Admitting: Internal Medicine

## 2016-08-10 ENCOUNTER — Ambulatory Visit: Payer: Medicare Other | Admitting: Internal Medicine

## 2016-09-13 IMAGING — DX DG CHEST 2V
2 series · 2 of 2 positions shown · non-contrast
Comparison: PA and lateral chest x-ray June 22, 2013

CLINICAL DATA: Shortness of breath, nonproductive cough for the
past week; history of coronary artery disease diabetes and bile duct
malignancy; former smoker.

EXAM:
CHEST  2 VIEW

[chest pa]
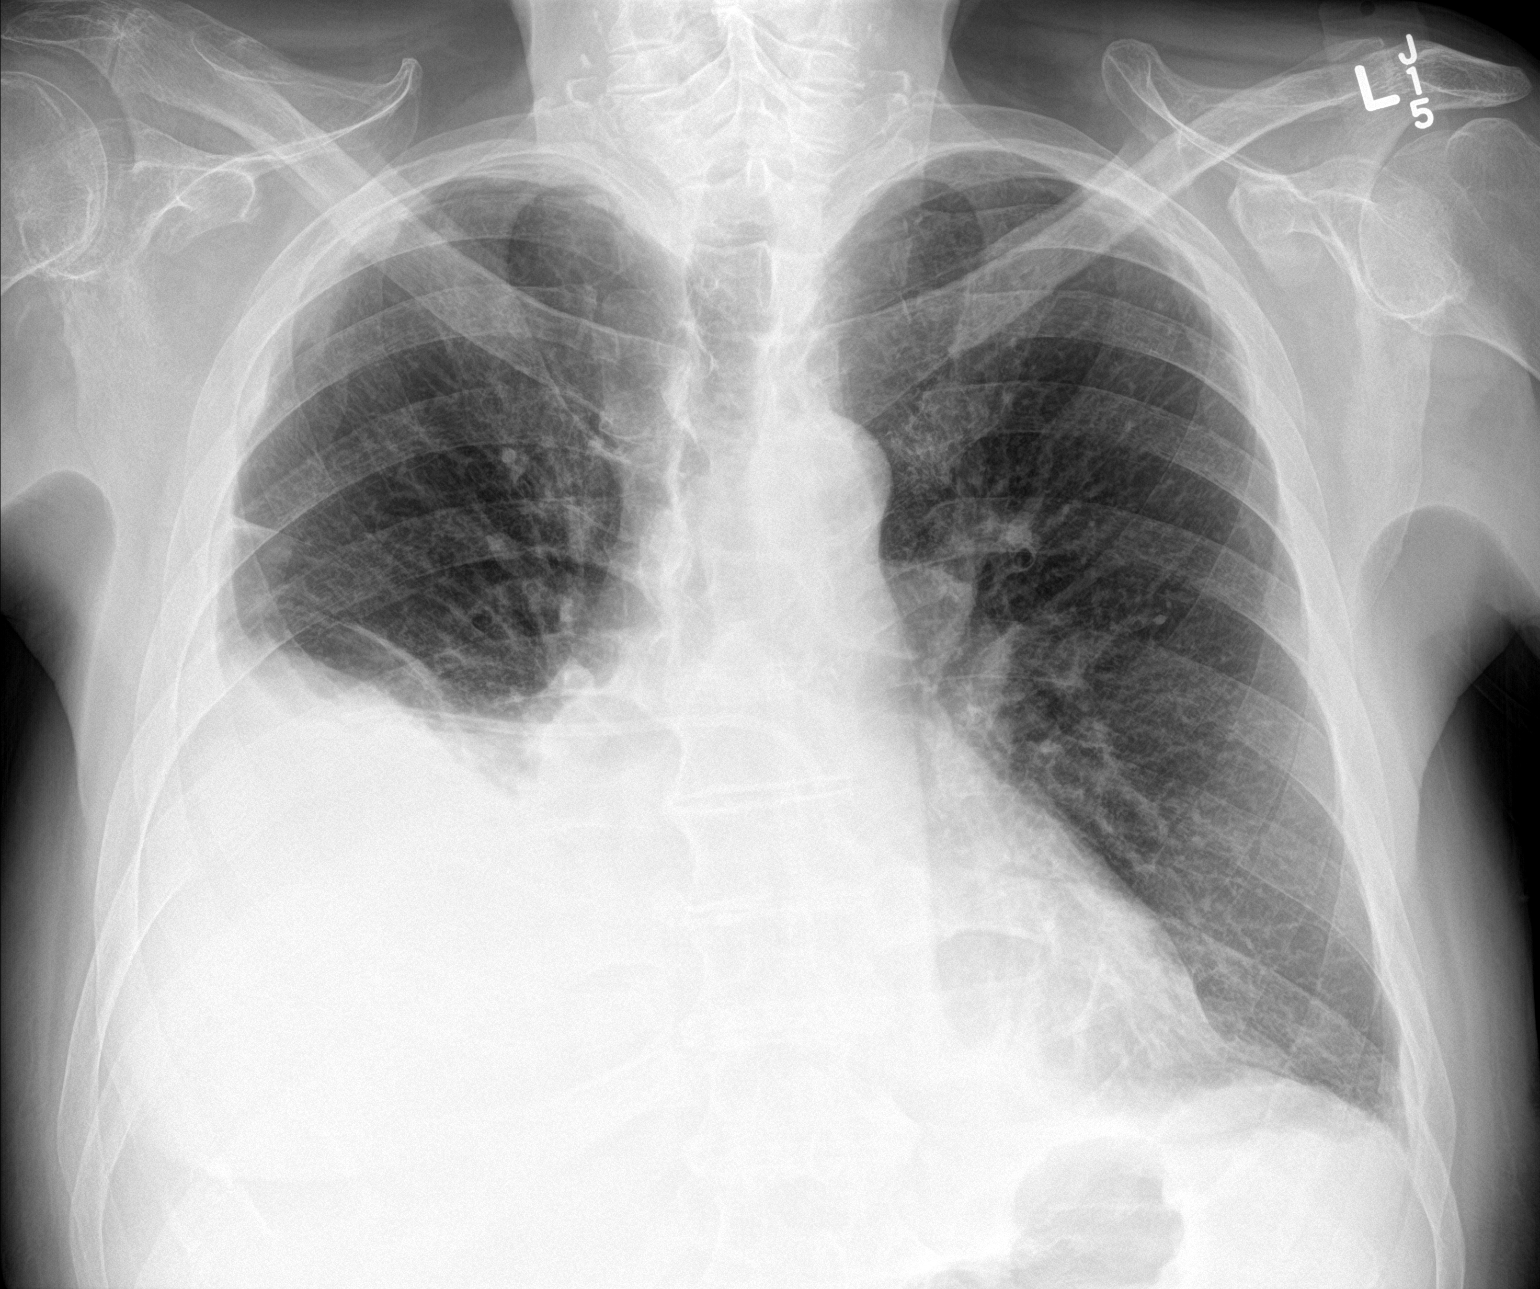

[chest lat]
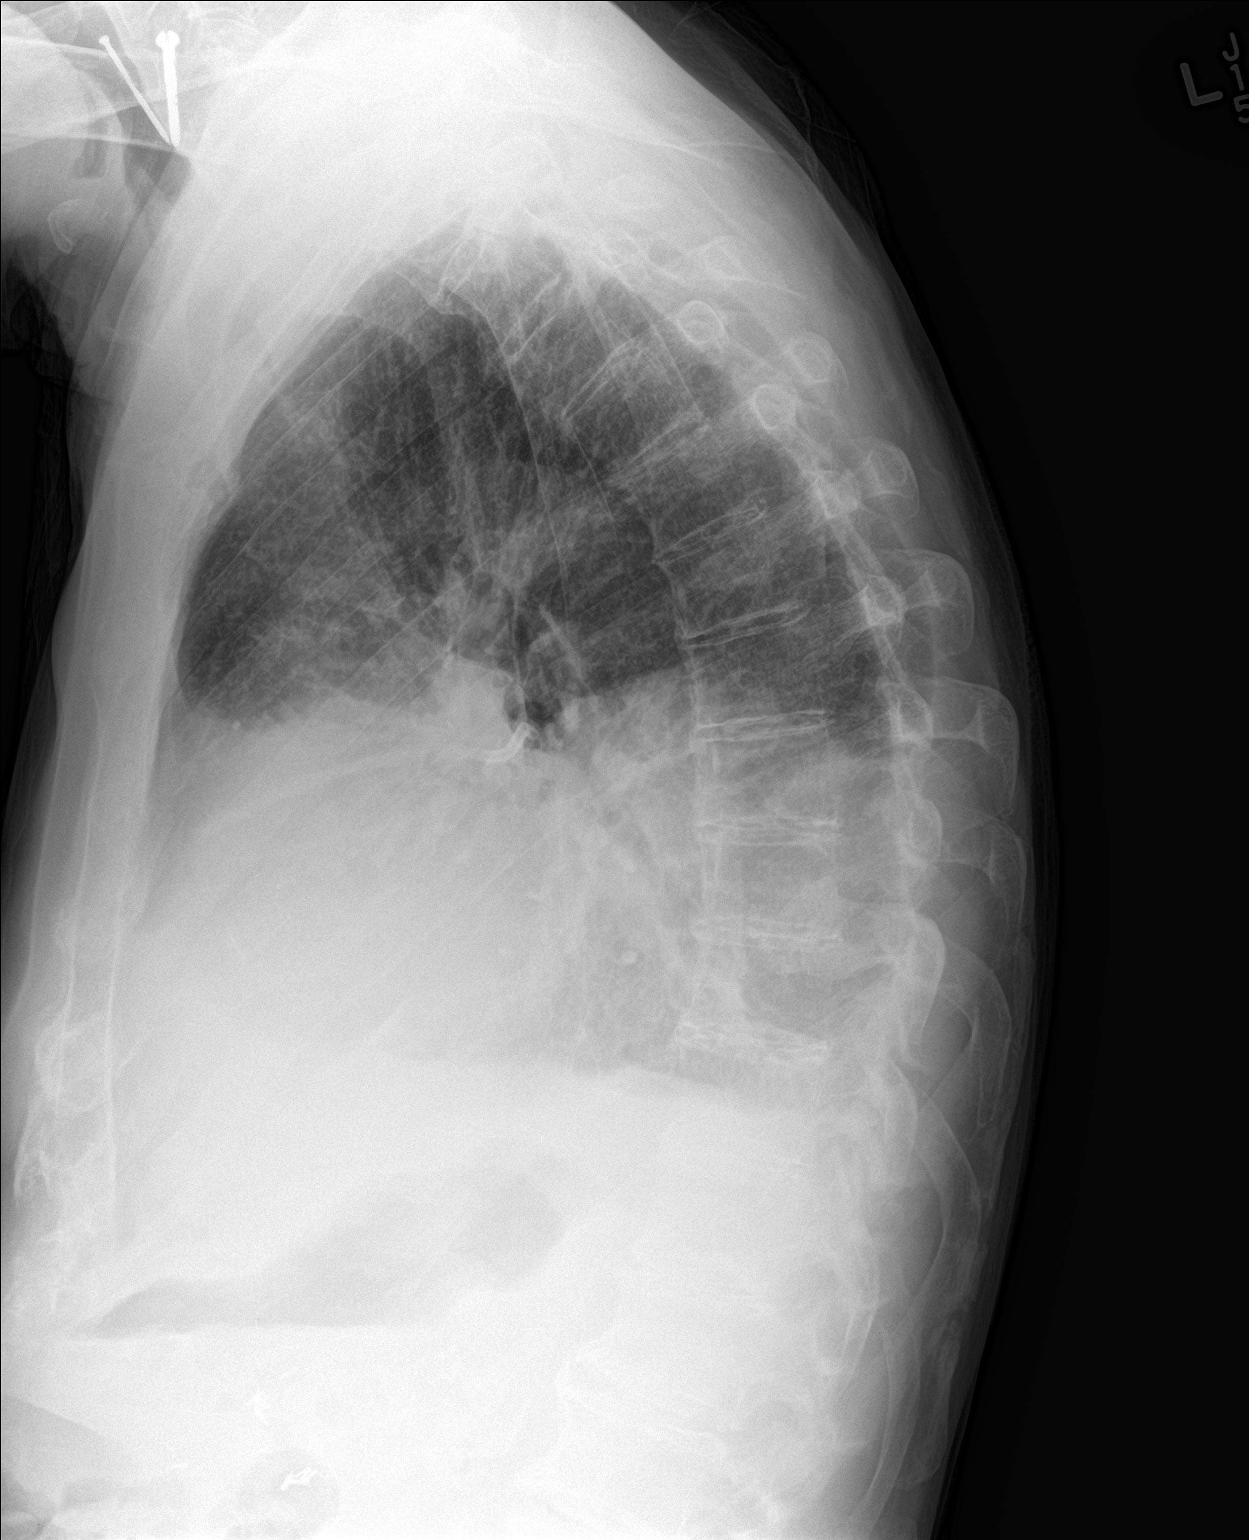

[2 of 2 positions shown; findings below may reference images not displayed]

FINDINGS: There is a new moderate-sized right pleural effusion occupying [DATE]
of the right hemithorax. Right basilar atelectasis or pneumonia may
be obscured. There is small amount of atelectasis or infiltrate at
the left lung base medially. A trace of pleural fluid on the left is
suspected. The cardiac silhouette is mildly enlarged. The pulmonary
vascularity is normal. The bony thorax exhibits no acute
abnormality.
IMPRESSION: Since the previous study the patient has developed a moderate sized
right pleural effusion. This may be obscuring right basilar
atelectasis, pneumonia, or mass. There is a trace left pleural
effusion and minimal left basilar atelectasis or infiltrate. There
is no evidence of CHF.

Chest CT scanning is recommended.

## 2016-10-19 IMAGING — DX DG CHEST 2V
2 series · 2 of 2 positions shown · non-contrast
Comparison: 12/04/2015 chest radiograph.

CLINICAL DATA: Shortness of breath.  Fever.  Weakness.

EXAM:
CHEST  2 VIEW

[w chest lat]
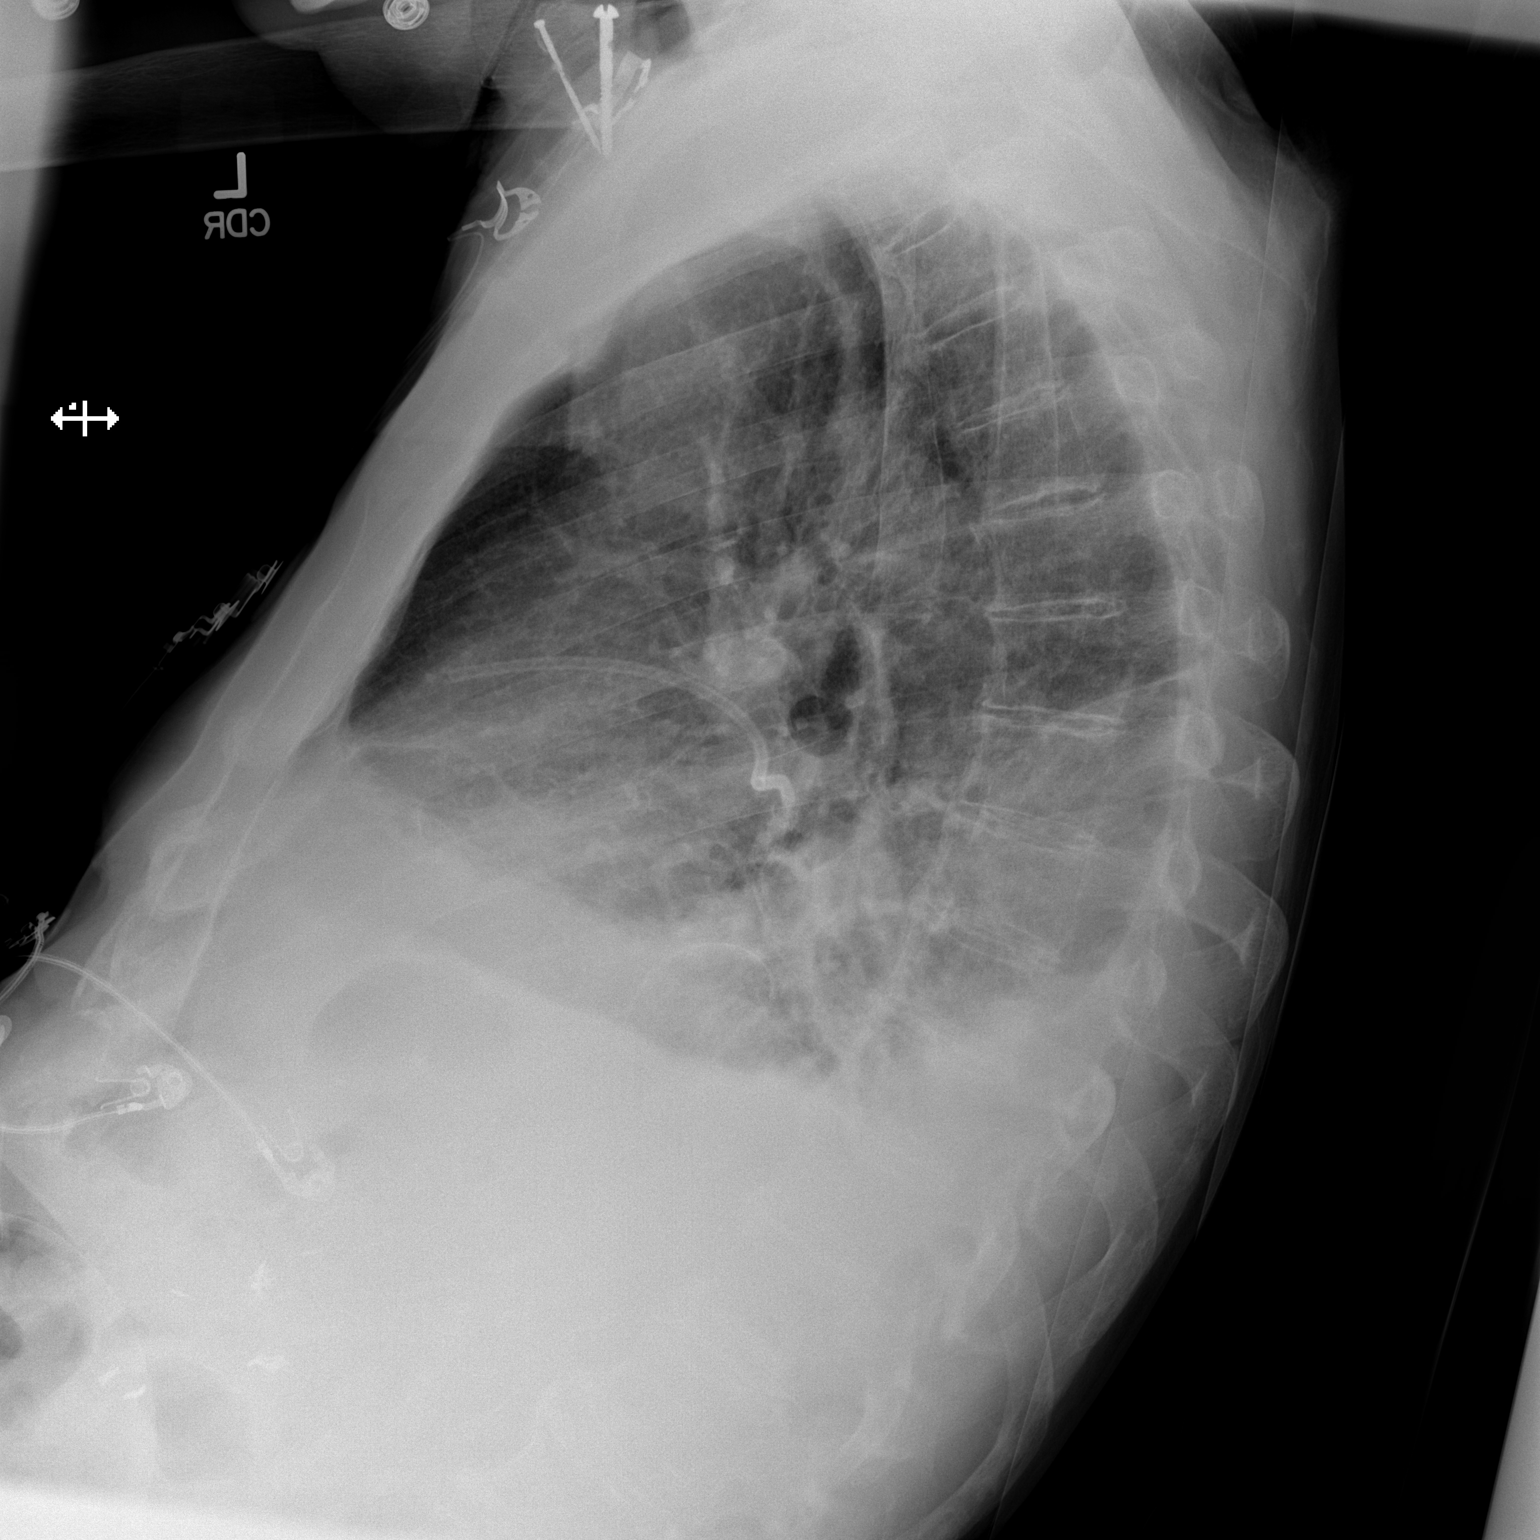

[x chest ap]
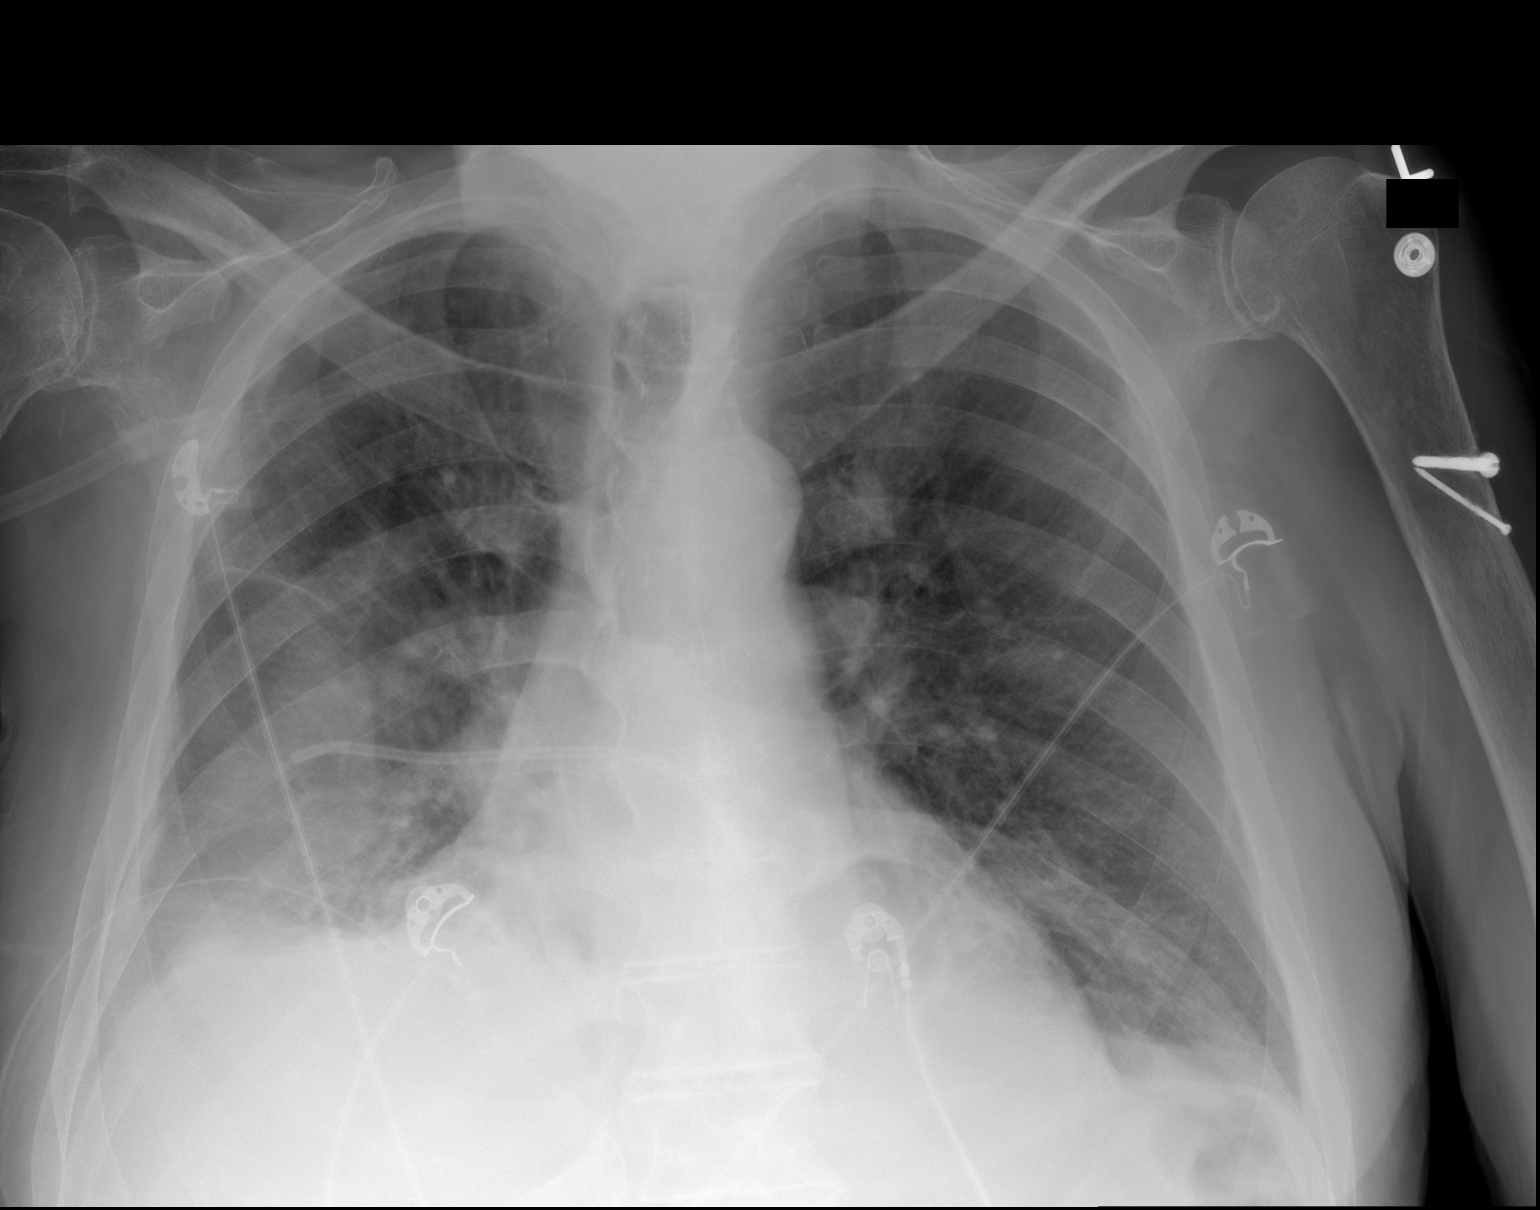

[2 of 2 positions shown; findings below may reference images not displayed]

FINDINGS: Catheter fragment appears stable in position in the mediastinum and
anterior mid to lower right lung. Right basilar chest tube is
stable. Stable cardiomediastinal silhouette with mild cardiomegaly.
No pneumothorax. There is interval reaccumulation of a small
posterior basilar right pleural effusion. No left pleural effusion.
There is new patchy opacity in the mid to lower right lung. There is
stable mild patchy opacity in the medial left lung base. No overt
pulmonary edema.
IMPRESSION: 1. Interval reaccumulation of a small posterior basilar right
pleural effusion despite stable position of the basilar right chest
tube. No pneumothorax.
2. New patchy opacity in the mid to lower right lung, suspicious for
new pneumonia.
3. Stable mild patchy opacity in the medial left lung base, favor
atelectasis.
4. Stable mild cardiomegaly without overt pulmonary edema.

## 2024-01-02 NOTE — Progress Notes (Signed)
 This encounter was created in error - please disregard.
